# Patient Record
Sex: Female | Born: 1975 | Hispanic: Yes | Smoking: Never smoker
Health system: Southern US, Community
[De-identification: ages and names within clinical notes are randomized; demographics above are authoritative.]

## PROBLEM LIST (undated history)

## (undated) DIAGNOSIS — K760 Fatty (change of) liver, not elsewhere classified: Secondary | ICD-10-CM

## (undated) DIAGNOSIS — Z789 Other specified health status: Secondary | ICD-10-CM

## (undated) DIAGNOSIS — E559 Vitamin D deficiency, unspecified: Secondary | ICD-10-CM

## (undated) DIAGNOSIS — R0602 Shortness of breath: Secondary | ICD-10-CM

## (undated) DIAGNOSIS — M549 Dorsalgia, unspecified: Secondary | ICD-10-CM

## (undated) DIAGNOSIS — R519 Headache, unspecified: Secondary | ICD-10-CM

## (undated) DIAGNOSIS — Z853 Personal history of malignant neoplasm of breast: Secondary | ICD-10-CM

## (undated) DIAGNOSIS — Z9221 Personal history of antineoplastic chemotherapy: Secondary | ICD-10-CM

## (undated) DIAGNOSIS — J45909 Unspecified asthma, uncomplicated: Secondary | ICD-10-CM

## (undated) DIAGNOSIS — E781 Pure hyperglyceridemia: Secondary | ICD-10-CM

## (undated) DIAGNOSIS — G8929 Other chronic pain: Secondary | ICD-10-CM

## (undated) DIAGNOSIS — C50919 Malignant neoplasm of unspecified site of unspecified female breast: Secondary | ICD-10-CM

## (undated) DIAGNOSIS — Z8669 Personal history of other diseases of the nervous system and sense organs: Secondary | ICD-10-CM

## (undated) DIAGNOSIS — Z923 Personal history of irradiation: Secondary | ICD-10-CM

## (undated) DIAGNOSIS — T7840XA Allergy, unspecified, initial encounter: Secondary | ICD-10-CM

## (undated) DIAGNOSIS — N649 Disorder of breast, unspecified: Secondary | ICD-10-CM

## (undated) HISTORY — DX: Dorsalgia, unspecified: M54.9

## (undated) HISTORY — DX: Personal history of other diseases of the nervous system and sense organs: Z86.69

## (undated) HISTORY — DX: Shortness of breath: R06.02

## (undated) HISTORY — PX: BREAST LUMPECTOMY: SHX2

## (undated) HISTORY — DX: Vitamin D deficiency, unspecified: E55.9

## (undated) HISTORY — DX: Other chronic pain: G89.29

## (undated) HISTORY — PX: TUBAL LIGATION: SHX77

## (undated) HISTORY — DX: Morbid (severe) obesity due to excess calories: E66.01

## (undated) HISTORY — DX: Personal history of malignant neoplasm of breast: Z85.3

## (undated) HISTORY — DX: Fatty (change of) liver, not elsewhere classified: K76.0

## (undated) HISTORY — DX: Disorder of breast, unspecified: N64.9

## (undated) HISTORY — DX: Unspecified asthma, uncomplicated: J45.909

## (undated) HISTORY — DX: Allergy, unspecified, initial encounter: T78.40XA

## (undated) HISTORY — PX: MASTECTOMY: SHX3

---

## 2002-02-04 ENCOUNTER — Emergency Department (HOSPITAL_COMMUNITY): Admission: AC | Admit: 2002-02-04 | Discharge: 2002-02-04 | Payer: Self-pay

## 2002-02-04 ENCOUNTER — Encounter: Payer: Self-pay | Admitting: Emergency Medicine

## 2002-04-02 ENCOUNTER — Emergency Department (HOSPITAL_COMMUNITY): Admission: EM | Admit: 2002-04-02 | Discharge: 2002-04-02 | Payer: Self-pay | Admitting: Emergency Medicine

## 2002-04-02 ENCOUNTER — Encounter: Payer: Self-pay | Admitting: Emergency Medicine

## 2006-12-24 ENCOUNTER — Inpatient Hospital Stay (HOSPITAL_COMMUNITY): Admission: AD | Admit: 2006-12-24 | Discharge: 2006-12-24 | Payer: Self-pay | Admitting: Obstetrics & Gynecology

## 2007-03-23 ENCOUNTER — Ambulatory Visit (HOSPITAL_COMMUNITY): Admission: RE | Admit: 2007-03-23 | Discharge: 2007-03-23 | Payer: Self-pay | Admitting: Family Medicine

## 2007-07-29 ENCOUNTER — Ambulatory Visit: Payer: Self-pay | Admitting: *Deleted

## 2007-07-29 ENCOUNTER — Inpatient Hospital Stay (HOSPITAL_COMMUNITY): Admission: AD | Admit: 2007-07-29 | Discharge: 2007-07-29 | Payer: Self-pay | Admitting: Obstetrics & Gynecology

## 2007-07-30 ENCOUNTER — Ambulatory Visit: Payer: Self-pay | Admitting: Obstetrics and Gynecology

## 2007-07-30 ENCOUNTER — Inpatient Hospital Stay (HOSPITAL_COMMUNITY): Admission: AD | Admit: 2007-07-30 | Discharge: 2007-08-03 | Payer: Self-pay | Admitting: Obstetrics & Gynecology

## 2007-07-30 ENCOUNTER — Inpatient Hospital Stay (HOSPITAL_COMMUNITY): Admission: AD | Admit: 2007-07-30 | Discharge: 2007-07-30 | Payer: Self-pay | Admitting: Obstetrics and Gynecology

## 2007-07-31 ENCOUNTER — Encounter: Payer: Self-pay | Admitting: Obstetrics & Gynecology

## 2009-04-10 ENCOUNTER — Ambulatory Visit (HOSPITAL_COMMUNITY): Admission: RE | Admit: 2009-04-10 | Discharge: 2009-04-10 | Payer: Self-pay | Admitting: Obstetrics & Gynecology

## 2009-07-29 ENCOUNTER — Inpatient Hospital Stay (HOSPITAL_COMMUNITY): Admission: AD | Admit: 2009-07-29 | Discharge: 2009-07-29 | Payer: Self-pay | Admitting: Obstetrics & Gynecology

## 2009-07-29 ENCOUNTER — Ambulatory Visit: Payer: Self-pay | Admitting: Advanced Practice Midwife

## 2009-07-31 ENCOUNTER — Inpatient Hospital Stay (HOSPITAL_COMMUNITY): Admission: AD | Admit: 2009-07-31 | Discharge: 2009-07-31 | Payer: Self-pay | Admitting: Obstetrics and Gynecology

## 2009-07-31 ENCOUNTER — Ambulatory Visit: Payer: Self-pay | Admitting: Advanced Practice Midwife

## 2009-08-01 ENCOUNTER — Inpatient Hospital Stay (HOSPITAL_COMMUNITY): Admission: AD | Admit: 2009-08-01 | Discharge: 2009-08-04 | Payer: Self-pay | Admitting: Obstetrics and Gynecology

## 2009-08-01 ENCOUNTER — Ambulatory Visit: Payer: Self-pay | Admitting: Obstetrics and Gynecology

## 2010-12-15 LAB — CBC
Platelets: 155 10*3/uL (ref 150–400)
Platelets: 171 10*3/uL (ref 150–400)
RDW: 14.8 % (ref 11.5–15.5)
WBC: 9.6 10*3/uL (ref 4.0–10.5)

## 2010-12-15 LAB — TYPE AND SCREEN: Antibody Screen: NEGATIVE

## 2010-12-15 LAB — RPR: RPR Ser Ql: NONREACTIVE

## 2011-01-25 NOTE — Discharge Summary (Signed)
NAMEADALIZ, DOBIS               ACCOUNT NO.:  [id number]   MEDICAL RECORD NO.:  [id number]          PATIENT TYPE:  INP   LOCATION:  9115                          [facility]ILITY:  [facility]   PHYSICIAN:  Allie Bossier, MD        DATE OF BIRTH:  1976/06/09   DATE OF ADMISSION:  07/30/2007  DATE OF DISCHARGE:  08/03/2007                               DISCHARGE SUMMARY   This is an obstetric discharge summary.   ADMISSION DIAGNOSIS:  Patient was admitted at 40 weeks and 3 days  gestation, in active labor.   DISCHARGE DIAGNOSES:  Postoperative day #3, status post primary low  transverse cesarean section for failure to progress.   SIGNIFICANT FINDINGS:  Prenatal lab workup showed blood type of O  positive, antibody screen was negative, RPR was negative, rubella titer  was immune, hepatitis B surface antigen was negative, patient was GBS  positive and HIV testing was nonreactive.  Preoperative hemoglobin was  found to be 12.5, white blood cell count was 11.3 and platelet count was  188.  Postoperative CBC showed a white blood cell count of 9.7,  hemoglobin of 9.6, and a platelet count of 136.   HOSPITAL COURSE:  Patient was admitted on July 30, 2007, with onset  of labor.  This occurred on July 30, 2007.  Patient was allowed to  labor throughout the evening and day of November 18 and, by the morning  of November 18, it was noted that the patient had failed to progress  further, before the decision was made to take the patient to the  operating room for primary low transverse cesarean section, with the  indication of failure to progress in labor.  On July 31, 2007,  patient was taken to the operating room, for primary low transverse  cesarean section for failure to progress.  The patient tolerated the  procedure without difficulty.  Procedure rendered a viable female infant  with Apgars of 9 and 9 at one and five minutes respectively.  Infant  weight was 7 pounds 2 ounces.  Placenta and  cord blood were sent to  pathology.  Estimated blood loss from the procedure was less than 600  mL.  Patient was taken to the recovery room in good and stable  condition.   Postoperatively, the patient did well and had no complications.  After  surgery, the patient's vital signs remained stable and patient was  afebrile throughout the remainder of her hospital course.  On  postoperative day #3, patient had decreased and controlled pain,  bleeding was controlled and decreased, as well.  Patient's incision site  was clean, dry and intact, and patient was ambulating without  difficulty, tolerating food by mouth and had had a bowel movement.  On  postoperative day #3, the patient was deemed ready for discharge in good  and stable condition.  Patient voiced that she was ready to be  discharged to home, as well.   Order was written to take out her staple on postoperative day #4 through  #7 by Siri Cole nurse at home.  DISCHARGE MEDICATIONS:  1. Percocet 5/325 mg p.o. one or two pills every 6 hours as needed for      pain.  2. Ibuprofen 600 mg one pill every 6 hours as needed for pain.  3. Colace 100 mg one pill twice a day as needed for constipation.  4. Prenatal vitamins one per day for 6 weeks or while breast-feeding.   DISCHARGE INSTRUCTIONS:  1. Patient is to take medications as previously.  2. Patient is to maintain pelvic rest for the next six weeks with      nothing in the vagina over this time.  3. Patient is to not undertake any heavy lifting for the next six      weeks.  4. Patient is to return for any increased abdominal pain, increased      bleeding, pain or drainage from the incision site or other      concerning symptoms.  5. Patient is breast-feeding at the time of discharge and declines      birth control.  6. Patient is to follow up routinely postpartum and [facility] Department in six weeks.  7. Patient is to have the [facility] nurse come out to  the house to      take operative staples out on postoperative day #4 through #7.      Order is written for this to be carried out.   DISCHARGE CONDITION:  Stable, good.   DISPOSITION:  Patient is discharged to home in good and stable  condition.  Patient is breast-feeding at the time of discharge.      Myrtie Soman, MD      Allie Bossier, MD  Electronically Signed    TE/MEDQ  D:  08/03/2007  T:  08/03/2007  Job:  [id number]

## 2011-01-25 NOTE — Op Note (Signed)
NAMECANDIA, KINGSBURY               ACCOUNT NO.:  [id number]   MEDICAL RECORD NO.:  [id number]          PATIENT TYPE:  INP   LOCATION:  9115                          [facility]ILITY[facility]  [facility]   PHYSICIAN:  Allie Bossier, MD        DATE OF BIRTH:  Sep 20, 1975   DATE OF PROCEDURE:  07/30/2007  DATE OF DISCHARGE:                               OPERATIVE REPORT   PREOPERATIVE DIAGNOSIS:  Failure to progress.   POSTOPERATIVE DIAGNOSIS:  Failure to progress.   PROCEDURE:  Primary low transverse cesarean section.   SURGEON:  Clarisa Kindred, M.D.   ANESTHESIA:  Malen Gauze, M.D.,  epidural   COMPLICATIONS:  None.   ESTIMATED BLOOD LOSS:  Less than 600 mL.   SPECIMENS:  Cord blood.   FINDINGS:  1. Living female infant, Apgars 9 and 9 at one and five minutes,      weight 7 pounds 2 ounces.  2. Normal pelvic anatomy.  3. Intact placenta with three-vessel cord.  The placenta was then      densely stained with meconium and was sent to pathology.   DETAILS OF THE PROCEDURE AND FINDINGS:  The risks, benefits, and  alternatives of surgery were explained, understood and accepted.  Consents were signed.  The use of an interpreter was appreciated.  In  the operating room, her epidural was bolused for surgery.  She was  placed in a dorsal supine position with left lateral tilt.  Scalp  electrode was removed from the baby.  Her abdomen was prepped and draped  in usual sterile fashion.  After adequate anesthesia was assured, a  Pfannenstiel transverse incision was made approximately 2 cm above the  symphysis pubis.  Incision was carried down through the moderate amount  of subcutaneous tissue and fascia.  There were several large  subcutaneous vessels that required cautery to yield excellent  hemostasis.  The fascia was scored in the midline, and fascial incision  was extended bilaterally and curved slightly upwards.  The inner 1/3 of  the rectus muscles were separated in transverse fashion using  electrosurgical  technique.  The muscles were gently pulled to each side  then.  The peritoneum was entered with hemostats.  Peritoneal incision  was extended with the Bovie, taking care to avoid the bladder.  A  bladder blade was placed.  A transverse incision was made on the  moderately well-developed lower uterine segment.  Amniotomy was  performed with hemostats, and the uterine incision was extended with  bandage scissors, taking care to avoid the baby.  The baby's head was  delivered, and a loose nuchal cord was noted.  This was easily removed.  The mouth and nostrils were thoroughly suctioned (the baby had meconium  in the amniotic fluid).  The baby was delivered, its cord was clamped  and cut, and it was transferred to the pediatrician for routine care.  Its weight was 7 pounds 2 ounces, Apgars 9 and 9 and one and five  minutes.  Cord blood sample was obtained, the uterus was left in situ,  the placenta was extracted using  traction.  Great care was taken to  remove all of the tenuous pieces of meconium-stained membranes from the  interior of the uterus.  A bladder blade was placed, and the uterine  incision was closed with 2 layers of 0 chromic running locking suture,  the second layer imbricating the first.  Excellent hemostasis was noted.  The ovaries were palpably normal.  The incision was again inspected and  noted be hemostatic as were the rectus muscles and rectus fascia.  The  fascia was closed with 0 Vicryl running nonlocking suture.  No defects  were palpable.  The subcutaneous tissue was irrigated, cleaned and  dried.  It was then infiltrated with 30 mL of 0.5% Marcaine.  All  vessels and the subcutaneous tissues were assured to be hemostatic, and  a subcuticular closure was done with 3-0 Vicryl suture in a running  nonlocking fashion.  Steri-Strips were placed.  Instrument, sponge and  needles counts were correct.  Her Foley catheter drained clear urine  throughout.  She was taken to the  recovery in stable condition.  She  tolerated the procedure well.      Allie Bossier, MD  Electronically Signed     MCD/MEDQ  D:  07/31/2007  T:  07/31/2007  Job:  [id number]

## 2011-06-21 LAB — CBC
HCT: 28.4 — ABNORMAL LOW
Hemoglobin: 12.5
MCV: 89.5
RBC: 3.17 — ABNORMAL LOW
RBC: 4.06
RDW: 14.5
WBC: 9.7

## 2013-07-13 ENCOUNTER — Ambulatory Visit: Payer: Self-pay | Admitting: Family Medicine

## 2013-07-13 ENCOUNTER — Ambulatory Visit: Payer: Self-pay

## 2013-07-13 VITALS — BP 132/72 | HR 54 | Temp 98.0°F | Resp 16 | Ht 61.5 in | Wt 167.0 lb

## 2013-07-13 DIAGNOSIS — M25539 Pain in unspecified wrist: Secondary | ICD-10-CM

## 2013-07-13 DIAGNOSIS — R946 Abnormal results of thyroid function studies: Secondary | ICD-10-CM

## 2013-07-13 DIAGNOSIS — G8929 Other chronic pain: Secondary | ICD-10-CM

## 2013-07-13 DIAGNOSIS — M549 Dorsalgia, unspecified: Secondary | ICD-10-CM

## 2013-07-13 DIAGNOSIS — Z603 Acculturation difficulty: Secondary | ICD-10-CM | POA: Insufficient documentation

## 2013-07-13 DIAGNOSIS — M25531 Pain in right wrist: Secondary | ICD-10-CM

## 2013-07-13 DIAGNOSIS — R7989 Other specified abnormal findings of blood chemistry: Secondary | ICD-10-CM

## 2013-07-13 LAB — TSH: TSH: 2.493 u[IU]/mL (ref 0.350–4.500)

## 2013-07-13 MED ORDER — METHOCARBAMOL 500 MG PO TABS: 500.0000 mg | ORAL_TABLET | Freq: Four times a day (QID) | ORAL | Status: DC

## 2013-07-13 NOTE — Progress Notes (Signed)
[facility] [address], [place] [address] [phone]  Date:  07/13/2013   Name:  [name]   DOB:  12/06/1975   MRN:  [id number]  PCP:  No primary provider on file.    Chief Complaint: Back Pain, Numbness and Hypothyroidism   History of Present Illness:  [name] is a 37 y.o. very pleasant female patient who presents with the following:  She is here with complaint of pain in her lower back for 2 years, and of numbness in her bilateral hands and arms at night.   She has had this pain constantly for 2 years, it is never gone.  She has tried some OTC medications without success. She also brings with her extensive labs done in July at a "Bariatric clinic" that state her TSH was slightly high.  Her TSH was at 4.6 at that time.  otherwise her labs are generally ok.    She also notes that when she cleans her home the dust causes her SOB.    She has lived in the [place] for 15 years.  Does not speak [place].   She has 2 children, 31 and 29 years old.  Admits that she would like to get back to work, she has not worked since her children were born and her husband wants her to stay home and care for her youngest.  She is stressed out by being home all the time.   She did have a c/s and required a blood patch after her most recent spinal anesthesia for chronic HA/pain.  She notes that she is having a lot of headaches again recently    LMP 07/01/13  There are no active problems to display for this patient.   History reviewed. No pertinent past medical history.  History reviewed. No pertinent past surgical history.  History  Substance Use Topics  . Smoking status: Never Smoker   . Smokeless tobacco: Not on file  . Alcohol Use: No    History reviewed. No pertinent family history.  No Known Allergies  Medication list has been reviewed and updated.  No current outpatient prescriptions on file prior to visit.   No current facility-administered  medications on file prior to visit.    Review of Systems:  As per HPI- otherwise negative.   Physical Examination: Filed Vitals:   07/13/13 1108  BP: 132/72  Pulse: 54  Temp: 98 F (36.7 C)  Resp: 16   Filed Vitals:   07/13/13 1108  Height: 5' 1.5" (1.562 m)  Weight: 167 lb (75.751 kg)   Body mass index is 31.05 kg/(m^2). Ideal Body Weight: Weight in (lb) to have BMI = 25: 134.2  GEN: WDWN, NAD, Non-toxic, A & O x 3, overweight, looks well HEENT: Atraumatic, Normocephalic. Neck supple. No masses, No LAD.  Bilateral TM wnl, oropharynx normal.  PEERL,EOMI.   Ears and Nose: No external deformity. CV: RRR, No M/G/R. No JVD. No thrill. No extra heart sounds. PULM: CTA B, no wheezes, crackles, rhonchi. No retractions. No resp. distress. No accessory muscle use. EXTR: No c/c/e NEURO Normal gait.  Moves all 4 extremities normally  PSYCH: Normally interactive. Conversant. Not depressed or anxious appearing.  Calm demeanor.  She indicates pain over the muscles of her entire thoracic back, bilaterally.   Normal grip and ROM in both hands and arms.  Normal sensation on exam.    [facility] reading (PRIMARY) by  Dr. Patsy Lager. T spine: negative THORACIC SPINE -  2 VIEW  COMPARISON: None.  FINDINGS: Vertebral body height is well maintained. Mild osteophytic changes are seen. The pedicles are within normal limits and no paraspinal mass lesion is noted.  IMPRESSION: No acute abnormality noted.  Assessment and Plan: TSH elevation - Plan: TSH  Chronic back pain - Plan: DG Thoracic Spine 2 View  Bilateral wrist pain - Plan: methocarbamol (ROBAXIN) 500 MG tablet  Will recheck TSH as her elevation was very slight prior to deciding about thyroid replacement therapy.   Advised her to purchase OTC wrist splints to wear for likely CTS.  She should use them at night until she feels better.  Chronic back pain: explained that her symptoms are likely due to stress and muscle pain.  I am glad to  order further imaging such as CT or MRI, but at this time she declines.  Suspect her sx are due in part to stress and lack of life opportunities.  She can try robaxin as needed.l  Let me know if not better soon.    Signed Abbe Amsterdam, MD

## 2013-07-13 NOTE — Patient Instructions (Signed)
Try the robaxin as needed for your back pain.   Please buy wrist splints at [facility] or another store and wear them at night.   I will be in touch with your repeat thyroid level.

## 2013-07-14 ENCOUNTER — Encounter: Payer: Self-pay | Admitting: Family Medicine

## 2014-01-11 ENCOUNTER — Ambulatory Visit: Payer: Self-pay | Admitting: Emergency Medicine

## 2014-01-11 VITALS — BP 118/72 | HR 91 | Temp 98.4°F | Resp 16 | Ht 61.5 in | Wt 172.4 lb

## 2014-01-11 DIAGNOSIS — R0789 Other chest pain: Secondary | ICD-10-CM

## 2014-01-11 DIAGNOSIS — K2970 Gastritis, unspecified, without bleeding: Secondary | ICD-10-CM

## 2014-01-11 DIAGNOSIS — K2990 Gastroduodenitis, unspecified, without bleeding: Secondary | ICD-10-CM

## 2014-01-11 MED ORDER — GI COCKTAIL ~~LOC~~
30.0000 mL | Freq: Once | ORAL | Status: AC
  Administered 2014-01-11: 30 mL via ORAL

## 2014-01-11 MED ORDER — OMEPRAZOLE 40 MG PO CPDR: 40.0000 mg | DELAYED_RELEASE_CAPSULE | Freq: Every day | ORAL | Status: DC

## 2014-01-11 MED ORDER — ALBUTEROL SULFATE HFA 108 (90 BASE) MCG/ACT IN AERS: 2.0000 | INHALATION_SPRAY | RESPIRATORY_TRACT | Status: DC | PRN

## 2014-01-11 NOTE — Progress Notes (Signed)
[facility] [address] [place] [address] [phone]  Date:  01/11/2014   Name:  [name]   DOB:  05-10-1976   MRN:  [id number]  PCP:  No primary provider on file.    Chief Complaint: Chest Pain, Arm Pain and Shortness of Breath   History of Present Illness:  [name] is a 38 y.o. very pleasant female patient who presents with the following:  Says she has a problem with breathing after sweeping the house.  She has used an MDI previously with similar symptoms.  Cleaned the house on Thursday and afterward developed chest tightness in the center of her chest associated with heartburn.  She has no cough or wheezing. She has no rhinorrhea.  No fever or chills.  Pain is not pleuritic.  Not radiating.  Worse after eating.  Not exercise related. Denies smoking, OCP, travel, immobilization or anesthesia.   She has no calf pain or swelling. She is obese, has no hypertension, hyperlipidemia, diabetes, or family history of premature cardiac disease.  She is premenopausal.    Patient Active Problem List   Diagnosis Date Noted  . Impaired ability to use community resources due to language barrier 07/13/2013    History reviewed. No pertinent past medical history.  History reviewed. No pertinent past surgical history.  History  Substance Use Topics  . Smoking status: Never Smoker   . Smokeless tobacco: Not on file  . Alcohol Use: No    No family history on file.  No Known Allergies  Medication list has been reviewed and updated.  Current Outpatient Prescriptions on File Prior to Visit  Medication Sig Dispense Refill  . methocarbamol (ROBAXIN) 500 MG tablet Take 1 tablet (500 mg total) by mouth 4 (four) times daily.  30 tablet  0   No current facility-administered medications on file prior to visit.    Review of Systems:  As per HPI, otherwise negative.    Physical Examination: Filed Vitals:   01/11/14 1643  BP: 118/72  Pulse: 91   Temp: 98.4 F (36.9 C)  Resp: 16   Filed Vitals:   01/11/14 1643  Height: 5' 1.5" (1.562 m)  Weight: 172 lb 6.4 oz (78.2 kg)   Body mass index is 32.05 kg/(m^2). Ideal Body Weight: Weight in (lb) to have BMI = 25: 134.2  GEN: obese, NAD, Non-toxic, A & O x 3 HEENT: Atraumatic, Normocephalic. Neck supple. No masses, No LAD. Ears and Nose: No external deformity. CV: RRR, No M/G/R. No JVD. No thrill. No extra heart sounds. PULM: CTA B, no wheezes, crackles, rhonchi. No retractions. No resp. distress. No accessory muscle use. ABD: S, NT, ND, +BS. No rebound. No HSM. EXTR: No c/c/e NEURO Normal gait.  PSYCH: Normally interactive. Conversant. Not depressed or anxious appearing.  Calm demeanor.    Assessment and Plan: Given GI cocktail which improved symptoms.  Allergy to dust. MDI  Signed,  Ellison Carwin, MD

## 2014-01-11 NOTE — Patient Instructions (Signed)
Inhalador con medidor de dosis (sin espaciador)  (Metered Dose Inhaler (No Spacer Used))  Los medicamentos que se inhalan son la base del tratamiento del asma y otros problemas respiratorios. Los medicamentos inhalatorios son eficaces cuando se utilizan adecuadamente. Una buena tcnica asegura que el medicamento llegue a los pulmones.  Inhalador con medidor de dosis se utiliza para administrar varios tipos de medicamentos inhalados. Entre ellos se incluyen los medicamentos de alivio rpido o medicamentos de rescate (como los broncodilatadores) y los medicamentos de control (como los corticoides). El medicamento se administra presionando el contenedor metlico para liberar una cantidad de aerosol.  Si usa diferentes tipos de inhaladores, use el medicamento de alivio rpido para abrir las vas areas 10 15 minutos antes de usar un corticoide, si el mdico se lo indic. Si no est seguro de qu inhaladores usar y le indicaron usarlo, consulte a su mdico, enfermera o terapeuta en vas respiratorias.  CMO USAR EL INHALADOR   1. Retire la tapa del inhalador.  2. Si usa el inhalador por primera vez, ser necesario que lo prepare. Sacuda el inhalador durante 5 segundos y libere cuatro puffs en el aire, lejos del rostro. Consulte a su mdico o farmacutico si tiene preguntas acerca de la preparacin del inhalador.  3. Sacuda el inhalador durante 5 segundos antes de cada inspiracin (inhalacin).  4. Coloque el inhalador de modo que la parte superior del envase quede hacia arriba.  5. Coloque el dedo ndice en la parte superior del envase del medicamento. El pulgar sostiene la parte inferior del inhalador.  6. Abra la boca.  7. Coloque el inhalador entre los dientes y apriete los labios fuertemente alrededor de la boquilla, o sostenga el inhalador a 1-2 pulgadas (2,5 - 5 cm)de la boca abierta. Si no est seguro de qu tcnica usar, consulte a su mdico.  8. Espire (exhale) normalmente y lo ms profundamente  posible.  9. Presione el envase hacia abajo con el dedo ndice para liberar el medicamento.  10. Al mismo tiempo que presiona el envase, inhale profunda y lentamente hasta que los pulmones se llenen completamente. Esto debe llevarle entre 4 6 segundos. Mantenga la boca hacia abajo.  11. Mantenga la medicacin en los pulmones hasta 5 - 10 segundos (10 segundos es lo ms indicado). Esto ayuda a que el medicamento ingrese en las vas respiratorias pequeas y en los pulmones.  12. Exhale lentamente a travs de los labios fruncidos. Ponga los labios como cuando silba.  13. Espere al menos 15 30 segundos entre los puffs. Contine con los pasos indicados anteriormente hasta que haya tomado el nmero de puffs que el mdico le indic. No use el inhalador ms de lo que su mdico le indique.  14. Vuelva a colocar la tapa en el inhalador.  15. Siga las indicaciones de su mdico o las instrucciones que vienen para limpiar el inhalador.  Si utiliza un inhalador con corticoides, enjuague su boca con agua despus del ltimo puff, hgase grgaras y escupa el agua. No trague el agua.  Evite:   Inhalar antes o despus de que comience a salir el aerosol del medicamento. Es necesario tener prctica para coordinar la respiracin con la emisin del aerosol.   Al emitir el aerosol, inhale por la nariz (ms que por la boca).  CMO SABER SI EL INHALADOR EST LLENO O VACO  No puede saber cundo un inhalador est vaco al sacudirlo. Algunos inhaladores ahora vienen con contador de dosis. Pdale a su mdico que le recete   el que tiene el contador de dosis si siente que necesita ayuda extra. Si el inhalador no tiene contador, pdale a su mdico que lo ayude a determinar la fecha en que necesitar volver a llenarlo. Anote la fecha en que deber rellenarlo en un almanaque o en el envase del inhalador. Rellene el inhalador entre 7 10 das antes de que se termine. Asegrese de tener un adecuado suministro del medicamento. Esto incluye asegurarse  de que no est vencido y de que tiene un inhalador de repuesto.   SOLICITE ATENCIN MDICA SI:    Los sntomas slo se alivian parcialmente con el inhalador.   Tiene dificultad para usar el inhalador.   Aumenta la produccin de flema.  SOLICITE ATENCIN MDICA DE INMEDIATO SI:    Siente poco alivio con el uso de los inhaladores, o ni tiene ningn alivio. Contina con sibilancias y siente que le falta el aire u opresin en el pecho, o ambos.   Siente mareos, dolor de cabeza o latidos cardacos rpidos.   Siente escalofros, fiebre o sudores nocturnos.   Nota aumento en la produccin de flema o hay sangre en la flema.  Document Released: 12/15/2008 Document Revised: 05/01/2013  [employer] Patient Information 2014 [employer].

## 2015-01-12 ENCOUNTER — Other Ambulatory Visit (HOSPITAL_COMMUNITY): Payer: Self-pay | Admitting: Physician Assistant

## 2015-01-12 DIAGNOSIS — Z3689 Encounter for other specified antenatal screening: Secondary | ICD-10-CM

## 2015-01-12 LAB — OB RESULTS CONSOLE HEPATITIS B SURFACE ANTIGEN: HEP B S AG: NEGATIVE

## 2015-01-12 LAB — OB RESULTS CONSOLE RUBELLA ANTIBODY, IGM: Rubella: IMMUNE

## 2015-01-12 LAB — OB RESULTS CONSOLE ANTIBODY SCREEN: Antibody Screen: NEGATIVE

## 2015-01-12 LAB — OB RESULTS CONSOLE ABO/RH: RH Type: POSITIVE

## 2015-01-12 LAB — OB RESULTS CONSOLE GC/CHLAMYDIA
Chlamydia: NEGATIVE
Gonorrhea: NEGATIVE

## 2015-01-12 LAB — OB RESULTS CONSOLE RPR: RPR: NONREACTIVE

## 2015-01-12 LAB — OB RESULTS CONSOLE HIV ANTIBODY (ROUTINE TESTING): HIV: NONREACTIVE

## 2015-01-20 ENCOUNTER — Ambulatory Visit (HOSPITAL_COMMUNITY)
Admission: RE | Admit: 2015-01-20 | Discharge: 2015-01-20 | Disposition: A | Discharge status: Home / Self Care | Payer: Self-pay | Source: Ambulatory Visit | Attending: Physician Assistant | Admitting: Physician Assistant

## 2015-01-20 ENCOUNTER — Other Ambulatory Visit (HOSPITAL_COMMUNITY): Payer: Self-pay | Admitting: Physician Assistant

## 2015-01-20 DIAGNOSIS — Z3A12 12 weeks gestation of pregnancy: Secondary | ICD-10-CM | POA: Insufficient documentation

## 2015-01-20 DIAGNOSIS — O208 Other hemorrhage in early pregnancy: Secondary | ICD-10-CM | POA: Insufficient documentation

## 2015-01-20 DIAGNOSIS — Z3689 Encounter for other specified antenatal screening: Secondary | ICD-10-CM

## 2015-01-20 DIAGNOSIS — O26841 Uterine size-date discrepancy, first trimester: Secondary | ICD-10-CM | POA: Insufficient documentation

## 2015-01-21 ENCOUNTER — Other Ambulatory Visit (HOSPITAL_COMMUNITY): Payer: Self-pay | Admitting: Physician Assistant

## 2015-01-21 DIAGNOSIS — Z3682 Encounter for antenatal screening for nuchal translucency: Secondary | ICD-10-CM

## 2015-01-22 ENCOUNTER — Ambulatory Visit (HOSPITAL_COMMUNITY)
Admission: RE | Admit: 2015-01-22 | Discharge: 2015-01-22 | Disposition: A | Discharge status: Home / Self Care | Payer: Medicaid Other | Source: Ambulatory Visit | Attending: Physician Assistant | Admitting: Physician Assistant

## 2015-01-22 ENCOUNTER — Other Ambulatory Visit (HOSPITAL_COMMUNITY): Payer: Self-pay | Admitting: Maternal and Fetal Medicine

## 2015-01-22 ENCOUNTER — Encounter (HOSPITAL_COMMUNITY): Payer: Self-pay

## 2015-01-22 ENCOUNTER — Other Ambulatory Visit (HOSPITAL_COMMUNITY): Payer: Self-pay | Admitting: Physician Assistant

## 2015-01-22 ENCOUNTER — Ambulatory Visit (HOSPITAL_COMMUNITY): Admission: RE | Admit: 2015-01-22 | Payer: Medicaid Other | Source: Ambulatory Visit

## 2015-01-22 VITALS — BP 133/72 | HR 84 | Wt 180.0 lb

## 2015-01-22 DIAGNOSIS — Z315 Encounter for genetic counseling: Secondary | ICD-10-CM | POA: Diagnosis not present

## 2015-01-22 DIAGNOSIS — Z3A13 13 weeks gestation of pregnancy: Secondary | ICD-10-CM | POA: Diagnosis not present

## 2015-01-22 DIAGNOSIS — IMO0002 Reserved for concepts with insufficient information to code with codable children: Secondary | ICD-10-CM

## 2015-01-22 DIAGNOSIS — O09521 Supervision of elderly multigravida, first trimester: Secondary | ICD-10-CM

## 2015-01-22 DIAGNOSIS — O09522 Supervision of elderly multigravida, second trimester: Secondary | ICD-10-CM

## 2015-01-22 DIAGNOSIS — Z3682 Encounter for antenatal screening for nuchal translucency: Secondary | ICD-10-CM | POA: Insufficient documentation

## 2015-01-22 DIAGNOSIS — Z0489 Encounter for examination and observation for other specified reasons: Secondary | ICD-10-CM

## 2015-01-22 DIAGNOSIS — O09529 Supervision of elderly multigravida, unspecified trimester: Secondary | ICD-10-CM | POA: Insufficient documentation

## 2015-01-22 NOTE — Progress Notes (Signed)
Genetic Counseling  High-Risk Gestation Note  Appointment Date:  01/22/2015 Referred By: Willene Hatchet, PA-C Date of Birth:  Dec 02, 1975   Pregnancy History: [id number] Estimated Date of Delivery: 07/30/15 Estimated Gestational Age: [redacted]w[redacted]d Attending: Wyvonne Lenz   [name] was seen for genetic counseling because of a maternal age of 39 y.o..   [place], provided interpretation for today's visit.   She was/They were counseled regarding maternal age and the association with risk for chromosome conditions due to nondisjunction with aging of the ova.   We reviewed chromosomes, nondisjunction, and the associated 1 in 13 risk for fetal aneuploidy related to a maternal age of 39 y.o. at [redacted]w[redacted]d gestation.  She was counseled that the risk for aneuploidy decreases as gestational age increases, accounting for those pregnancies which spontaneously abort.  We specifically discussed Down syndrome (trisomy 86), trisomies 54 and 18, and sex chromosome aneuploidies (47,XXX and 47,XXY) including the common features and prognoses of each.   We reviewed available screening options including First Screen, Quad screen, noninvasive prenatal screening (NIPS)/cell free DNA (cfDNA) testing, and detailed ultrasound.  She was counseled that screening tests are used to modify a patient's a priori risk for aneuploidy, typically based on age. This estimate provides a pregnancy specific risk assessment. We reviewed the benefits and limitations of each option. Specifically, we discussed the conditions for which each test screens, the detection rates, and false positive rates of each. She was also counseled regarding diagnostic testing via amniocentesis. We reviewed the approximate 1 in 102-585 risk for complications for amniocentesis, including spontaneous pregnancy loss.   After consideration of all the options, she elected to proceed with ultrasound only in the pregnancy. Ms.  [name] declined amniocentesis in the pregnancy, given the associated risk for complications. She initially expressed interest in NIPS; however, after further consideration, she stated that she changed her mind and would not want to know in a pregnancy if a chromosome condition was present. At this time, she declined NIPS, First Screen, and Quad screen.   Nuchal translucency ultrasound was performed today.  The report will be documented separately.  Detailed ultrasound was scheduled for 03/04/15.  She understands that screening tests cannot rule out all birth defects or genetic syndromes. The patient was advised of this limitation and states she still does not want additional testing at this time.   Family history information was not obtained at this time due to time constraints and given that patient was not originally scheduled for genetic counseling today.   [name] denied exposure to environmental toxins or chemical agents. She denied the use of alcohol, tobacco or street drugs. She denied significant viral illnesses during the course of her pregnancy. Her medical and surgical histories were noncontributory.   I counseled [name] regarding the above risks and available options.  The approximate face-to-face time with the genetic counselor was 45 minutes.  Chipper Oman, MS,  Certified Genetic Counselor 01/22/2015

## 2015-03-04 ENCOUNTER — Ambulatory Visit (HOSPITAL_COMMUNITY)
Admission: RE | Admit: 2015-03-04 | Discharge: 2015-03-04 | Disposition: A | Discharge status: Home / Self Care | Payer: Medicaid Other | Source: Ambulatory Visit | Attending: Physician Assistant | Admitting: Physician Assistant

## 2015-03-04 DIAGNOSIS — Z36 Encounter for antenatal screening of mother: Secondary | ICD-10-CM | POA: Insufficient documentation

## 2015-03-04 DIAGNOSIS — Z3689 Encounter for other specified antenatal screening: Secondary | ICD-10-CM | POA: Insufficient documentation

## 2015-03-04 DIAGNOSIS — Z3A18 18 weeks gestation of pregnancy: Secondary | ICD-10-CM | POA: Insufficient documentation

## 2015-03-04 DIAGNOSIS — O3421 Maternal care for scar from previous cesarean delivery: Secondary | ICD-10-CM | POA: Insufficient documentation

## 2015-03-04 DIAGNOSIS — O09522 Supervision of elderly multigravida, second trimester: Secondary | ICD-10-CM

## 2015-07-08 ENCOUNTER — Encounter (HOSPITAL_COMMUNITY): Payer: Self-pay | Admitting: Family Medicine

## 2015-07-08 DIAGNOSIS — Z302 Encounter for sterilization: Secondary | ICD-10-CM

## 2015-07-08 DIAGNOSIS — O34219 Maternal care for unspecified type scar from previous cesarean delivery: Secondary | ICD-10-CM | POA: Diagnosis present

## 2015-07-08 NOTE — H&P (Signed)
Monica BLODGETT is an 39 y.o. [phone] [redacted]w[redacted]d female.   Chief Complaint: Previous C-section x 2 and undesired fertility HPI: At 3 wks for Repeat C-section  No past medical history on file.  No past surgical history on file.  No family history on file. Social History:  reports that she has never smoked. She does not have any smokeless tobacco history on file. She reports that she does not drink alcohol. Her drug history is not on file.   No Known Allergies  No current facility-administered medications on file prior to encounter.   Current Outpatient Prescriptions on File Prior to Encounter  Medication Sig Dispense Refill  . albuterol (PROVENTIL HFA;VENTOLIN HFA) 108 (90 BASE) MCG/ACT inhaler Inhale 2 puffs into the lungs every 4 (four) hours as needed for wheezing or shortness of breath (cough, shortness of breath or wheezing.). 1 Inhaler 1  . methocarbamol (ROBAXIN) 500 MG tablet Take 1 tablet (500 mg total) by mouth 4 (four) times daily. (Patient not taking: Reported on 01/22/2015) 30 tablet 0  . omeprazole (PRILOSEC) 40 MG capsule Take 1 capsule (40 mg total) by mouth daily. (Patient not taking: Reported on 01/22/2015) 30 capsule 3  . Prenatal Vit-Fe Fumarate-FA (PRENATAL VITAMIN PO) Take by mouth.      A comprehensive review of systems was negative.  Last menstrual period 10/23/2014. General appearance: alert, cooperative and appears stated age Head: Normocephalic, without obvious abnormality, atraumatic Neck: supple, symmetrical, trachea midline Lungs: normal effort Heart: regular rate and rhythm Abdomen: gravid, NT Extremities: extremities normal, atraumatic, no cyanosis or edema Skin: Skin color, texture, turgor normal. No rashes or lesions Neurologic: Grossly normal   Lab Results  Component Value Date   WBC 8.2 08/02/2009   HGB 9.7* 08/02/2009   HCT 29.3* 08/02/2009   MCV 88.3 08/02/2009   PLT 155 08/02/2009    Assessment/Plan Patient Active Problem List   Diagnosis Date Noted  . Previous cesarean delivery, antepartum condition or complication [phone]  . Encounter for sterilization 07/08/2015  . Advanced maternal age in multigravida   . Impaired ability to use community resources due to language barrier 07/13/2013  For RLTCS and BTL Risks include but are not limited to bleeding, infection, injury to surrounding structures, including bowel, bladder and ureters, blood clots, and death.  Likelihood of success is high. Patient counseled, r.e. Risks benefits of BTL, including permanency of procedure, risk of failure(1:100), increased risk of ectopic.  Patient verbalized understanding and desires to proceed     PRATT,TANYA S 07/08/2015, 11:20 AM

## 2015-07-22 ENCOUNTER — Inpatient Hospital Stay (HOSPITAL_COMMUNITY)
Admission: RE | Admit: 2015-07-22 | Discharge: 2015-07-22 | Disposition: A | Discharge status: Home / Self Care | Payer: Medicaid Other | Source: Ambulatory Visit

## 2015-07-22 DIAGNOSIS — O34219 Maternal care for unspecified type scar from previous cesarean delivery: Secondary | ICD-10-CM

## 2015-07-22 NOTE — Patient Instructions (Signed)
   Your procedure is scheduled on: NOV 10 (THURSDAY)  Enter through the Main Entrance of [facility] up the phone at the desk and dial [phone] and inform us of your arrival.  Please call this number if you have any problems the morning of surgery: [phone]  DO NOT EAT OR DRINK AFTER MIDNIGHT NOV 9 (WEDNESDAY)  Take these medicines the morning of surgery with a SIP OF WATER:  Do not wear jewelry No metal in your hair or on your body. Do not wear lotions, powders, perfumes.  You may wear deodorant.  Do not bring valuables to the hospital. Contacts, dentures or bridgework may not be worn into surgery.  Leave suitcase in the car. After Surgery it may be brought to your room. For patients being admitted to the hospital, checkout time is 11:00am the day of discharge.

## 2015-07-22 NOTE — Anesthesia Preprocedure Evaluation (Addendum)
Anesthesia Evaluation  Patient identified by MRN, date of birth, ID band Patient awake    Reviewed: Allergy & Precautions, NPO status , Patient's Chart, lab work & pertinent test results  History of Anesthesia Complications Negative for: history of anesthetic complications  Airway Mallampati: III  TM Distance: >3 FB Neck ROM: Full    Dental no notable dental hx. (+) Dental Advisory Given   Pulmonary neg pulmonary ROS,    Pulmonary exam normal breath sounds clear to auscultation       Cardiovascular negative cardio ROS Normal cardiovascular exam Rhythm:Regular Rate:Normal     Neuro/Psych negative neurological ROS  negative psych ROS   GI/Hepatic negative GI ROS, Neg liver ROS,   Endo/Other  obesity  Renal/GU negative Renal ROS  negative genitourinary   Musculoskeletal negative musculoskeletal ROS (+)   Abdominal   Peds negative pediatric ROS (+)  Hematology negative hematology ROS (+)   Anesthesia Other Findings   Reproductive/Obstetrics (+) Pregnancy                            Anesthesia Physical Anesthesia Plan  ASA: II  Anesthesia Plan: Spinal   Post-op Pain Management:    Induction:   Airway Management Planned:   Additional Equipment:   Intra-op Plan:   Post-operative Plan:   Informed Consent: I have reviewed the patients History and Physical, chart, labs and discussed the procedure including the risks, benefits and alternatives for the proposed anesthesia with the patient or authorized representative who has indicated his/her understanding and acceptance.   Dental advisory given  Plan Discussed with: CRNA  Anesthesia Plan Comments:         Anesthesia Quick Evaluation

## 2015-07-22 NOTE — Progress Notes (Signed)
PT DID NOT SHOW UP FOR PRE-OP VISIT.Marland Kitchen EDA ROYAL CALLED PT AND GAVE LSD INSTRUCTIONS OVER THE PHONE.Marland Kitchen PT TO ARRIVE AT 7AM MAIN ENTRANCE.. NPO AFTER MN TONIGHT

## 2015-07-23 ENCOUNTER — Encounter (HOSPITAL_COMMUNITY): Payer: Self-pay | Admitting: Anesthesiology

## 2015-07-23 ENCOUNTER — Inpatient Hospital Stay (HOSPITAL_COMMUNITY): Payer: Medicaid Other | Admitting: Anesthesiology

## 2015-07-23 ENCOUNTER — Encounter (HOSPITAL_COMMUNITY)
Admission: RE | Disposition: A | Discharge status: Home / Self Care | Payer: Self-pay | Source: Ambulatory Visit | Attending: Family Medicine

## 2015-07-23 ENCOUNTER — Inpatient Hospital Stay (HOSPITAL_COMMUNITY)
Admission: RE | Admit: 2015-07-23 | Discharge: 2015-07-25 | DRG: 766 | Disposition: A | Discharge status: Home / Self Care | Payer: Medicaid Other | Source: Ambulatory Visit | Attending: Family Medicine | Admitting: Family Medicine

## 2015-07-23 DIAGNOSIS — Z3A39 39 weeks gestation of pregnancy: Secondary | ICD-10-CM

## 2015-07-23 DIAGNOSIS — O34219 Maternal care for unspecified type scar from previous cesarean delivery: Secondary | ICD-10-CM | POA: Diagnosis present

## 2015-07-23 DIAGNOSIS — O34211 Maternal care for low transverse scar from previous cesarean delivery: Principal | ICD-10-CM | POA: Diagnosis present

## 2015-07-23 DIAGNOSIS — O99824 Streptococcus B carrier state complicating childbirth: Secondary | ICD-10-CM | POA: Diagnosis present

## 2015-07-23 DIAGNOSIS — O09523 Supervision of elderly multigravida, third trimester: Secondary | ICD-10-CM

## 2015-07-23 DIAGNOSIS — Z302 Encounter for sterilization: Secondary | ICD-10-CM | POA: Diagnosis not present

## 2015-07-23 DIAGNOSIS — Z3490 Encounter for supervision of normal pregnancy, unspecified, unspecified trimester: Secondary | ICD-10-CM

## 2015-07-23 LAB — CBC
HCT: 35.1 % — ABNORMAL LOW (ref 36.0–46.0)
HEMOGLOBIN: 11.7 g/dL — AB (ref 12.0–15.0)
MCH: 28.4 pg (ref 26.0–34.0)
MCHC: 33.3 g/dL (ref 30.0–36.0)
MCV: 85.2 fL (ref 78.0–100.0)
PLATELETS: 187 10*3/uL (ref 150–400)
RBC: 4.12 MIL/uL (ref 3.87–5.11)
RDW: 13.8 % (ref 11.5–15.5)
WBC: 6.4 10*3/uL (ref 4.0–10.5)

## 2015-07-23 LAB — TYPE AND SCREEN
ABO/RH(D): O POS
Antibody Screen: NEGATIVE

## 2015-07-23 LAB — RPR: RPR: NONREACTIVE

## 2015-07-23 SURGERY — Surgical Case
Anesthesia: Spinal | Site: Abdomen | Laterality: Bilateral

## 2015-07-23 MED ORDER — OXYTOCIN 10 UNIT/ML IJ SOLN
INTRAMUSCULAR | Status: AC
  Filled 2015-07-23: qty 4

## 2015-07-23 MED ORDER — BUPIVACAINE HCL (PF) 0.25 % IJ SOLN
INTRAMUSCULAR | Status: AC
  Filled 2015-07-23: qty 30

## 2015-07-23 MED ORDER — ZOLPIDEM TARTRATE 5 MG PO TABS: 5.0000 mg | ORAL_TABLET | Freq: Every evening | ORAL | Status: DC | PRN

## 2015-07-23 MED ORDER — SENNOSIDES-DOCUSATE SODIUM 8.6-50 MG PO TABS
2.0000 | ORAL_TABLET | ORAL | Status: DC
  Administered 2015-07-24 (×2): 2 via ORAL
  Filled 2015-07-23 (×2): qty 2

## 2015-07-23 MED ORDER — SCOPOLAMINE 1 MG/3DAYS TD PT72
MEDICATED_PATCH | TRANSDERMAL | Status: AC
  Administered 2015-07-23: 1.5 mg via TRANSDERMAL
  Filled 2015-07-23: qty 1

## 2015-07-23 MED ORDER — FENTANYL CITRATE (PF) 100 MCG/2ML IJ SOLN
INTRAMUSCULAR | Status: AC
  Filled 2015-07-23: qty 2

## 2015-07-23 MED ORDER — MORPHINE SULFATE (PF) 0.5 MG/ML IJ SOLN
INTRAMUSCULAR | Status: DC | PRN
  Administered 2015-07-23: .2 mg via EPIDURAL

## 2015-07-23 MED ORDER — NALBUPHINE HCL 10 MG/ML IJ SOLN: 5.0000 mg | Freq: Once | INTRAMUSCULAR | Status: DC | PRN

## 2015-07-23 MED ORDER — ALBUTEROL SULFATE (2.5 MG/3ML) 0.083% IN NEBU: 3.0000 mL | INHALATION_SOLUTION | RESPIRATORY_TRACT | Status: DC | PRN

## 2015-07-23 MED ORDER — MENTHOL 3 MG MT LOZG: 1.0000 | LOZENGE | OROMUCOSAL | Status: DC | PRN

## 2015-07-23 MED ORDER — SIMETHICONE 80 MG PO CHEW: 80.0000 mg | CHEWABLE_TABLET | ORAL | Status: DC | PRN

## 2015-07-23 MED ORDER — DIPHENHYDRAMINE HCL 50 MG/ML IJ SOLN: 12.5000 mg | INTRAMUSCULAR | Status: DC | PRN

## 2015-07-23 MED ORDER — KETOROLAC TROMETHAMINE 30 MG/ML IJ SOLN
30.0000 mg | Freq: Four times a day (QID) | INTRAMUSCULAR | Status: AC | PRN
  Administered 2015-07-23: 30 mg via INTRAMUSCULAR

## 2015-07-23 MED ORDER — BUPIVACAINE HCL (PF) 0.5 % IJ SOLN
INTRAMUSCULAR | Status: AC
  Filled 2015-07-23: qty 90

## 2015-07-23 MED ORDER — ACETAMINOPHEN 325 MG PO TABS
650.0000 mg | ORAL_TABLET | ORAL | Status: DC | PRN
  Administered 2015-07-23 (×2): 650 mg via ORAL
  Filled 2015-07-23 (×2): qty 2

## 2015-07-23 MED ORDER — PHENYLEPHRINE 8 MG IN D5W 100 ML (0.08MG/ML) PREMIX OPTIME
INJECTION | INTRAVENOUS | Status: DC | PRN
  Administered 2015-07-23: 60 ug/min via INTRAVENOUS

## 2015-07-23 MED ORDER — LACTATED RINGERS IV SOLN
INTRAVENOUS | Status: DC
  Administered 2015-07-23: 20:00:00 via INTRAVENOUS

## 2015-07-23 MED ORDER — CEFAZOLIN SODIUM-DEXTROSE 2-3 GM-% IV SOLR
INTRAVENOUS | Status: AC
  Filled 2015-07-23: qty 50

## 2015-07-23 MED ORDER — FENTANYL CITRATE (PF) 100 MCG/2ML IJ SOLN
25.0000 ug | INTRAMUSCULAR | Status: DC | PRN
  Administered 2015-07-23 (×2): 50 ug via INTRAVENOUS

## 2015-07-23 MED ORDER — NALOXONE HCL 2 MG/2ML IJ SOSY
1.0000 ug/kg/h | PREFILLED_SYRINGE | INTRAVENOUS | Status: DC | PRN
  Filled 2015-07-23: qty 2

## 2015-07-23 MED ORDER — BUPIVACAINE HCL (PF) 0.25 % IJ SOLN
INTRAMUSCULAR | Status: AC
  Filled 2015-07-23: qty 10

## 2015-07-23 MED ORDER — CEFAZOLIN SODIUM-DEXTROSE 2-3 GM-% IV SOLR
2.0000 g | INTRAVENOUS | Status: AC
  Administered 2015-07-23: 2 g via INTRAVENOUS

## 2015-07-23 MED ORDER — NALBUPHINE HCL 10 MG/ML IJ SOLN: 5.0000 mg | INTRAMUSCULAR | Status: DC | PRN

## 2015-07-23 MED ORDER — ONDANSETRON HCL 4 MG/2ML IJ SOLN: 4.0000 mg | Freq: Three times a day (TID) | INTRAMUSCULAR | Status: DC | PRN

## 2015-07-23 MED ORDER — LACTATED RINGERS IV SOLN
INTRAVENOUS | Status: DC
  Administered 2015-07-23 (×2): via INTRAVENOUS

## 2015-07-23 MED ORDER — MEPERIDINE HCL 25 MG/ML IJ SOLN: 6.2500 mg | INTRAMUSCULAR | Status: DC | PRN

## 2015-07-23 MED ORDER — DIBUCAINE 1 % RE OINT: 1.0000 "application " | TOPICAL_OINTMENT | RECTAL | Status: DC | PRN

## 2015-07-23 MED ORDER — SCOPOLAMINE 1 MG/3DAYS TD PT72
1.0000 | MEDICATED_PATCH | Freq: Once | TRANSDERMAL | Status: DC
  Administered 2015-07-23: 1.5 mg via TRANSDERMAL

## 2015-07-23 MED ORDER — DIPHENHYDRAMINE HCL 25 MG PO CAPS: 25.0000 mg | ORAL_CAPSULE | Freq: Four times a day (QID) | ORAL | Status: DC | PRN

## 2015-07-23 MED ORDER — ONDANSETRON HCL 4 MG/2ML IJ SOLN: 4.0000 mg | Freq: Once | INTRAMUSCULAR | Status: DC | PRN

## 2015-07-23 MED ORDER — ONDANSETRON HCL 4 MG/2ML IJ SOLN
INTRAMUSCULAR | Status: DC | PRN
  Administered 2015-07-23: 4 mg via INTRAVENOUS

## 2015-07-23 MED ORDER — BUPIVACAINE-EPINEPHRINE (PF) 0.5% -1:200000 IJ SOLN
INTRAMUSCULAR | Status: AC
  Filled 2015-07-23: qty 30

## 2015-07-23 MED ORDER — SIMETHICONE 80 MG PO CHEW
80.0000 mg | CHEWABLE_TABLET | ORAL | Status: DC
  Administered 2015-07-24: 80 mg via ORAL
  Filled 2015-07-23: qty 1

## 2015-07-23 MED ORDER — TETANUS-DIPHTH-ACELL PERTUSSIS 5-2.5-18.5 LF-MCG/0.5 IM SUSP: 0.5000 mL | Freq: Once | INTRAMUSCULAR | Status: DC

## 2015-07-23 MED ORDER — LANOLIN HYDROUS EX OINT: 1.0000 "application " | TOPICAL_OINTMENT | CUTANEOUS | Status: DC | PRN

## 2015-07-23 MED ORDER — PHENYLEPHRINE 8 MG IN D5W 100 ML (0.08MG/ML) PREMIX OPTIME
INJECTION | INTRAVENOUS | Status: AC
  Filled 2015-07-23: qty 100

## 2015-07-23 MED ORDER — SIMETHICONE 80 MG PO CHEW
80.0000 mg | CHEWABLE_TABLET | Freq: Three times a day (TID) | ORAL | Status: DC
  Administered 2015-07-23 – 2015-07-25 (×6): 80 mg via ORAL
  Filled 2015-07-23 (×6): qty 1

## 2015-07-23 MED ORDER — SODIUM CHLORIDE 0.9 % IJ SOLN: 3.0000 mL | INTRAMUSCULAR | Status: DC | PRN

## 2015-07-23 MED ORDER — LACTATED RINGERS IV SOLN
INTRAVENOUS | Status: DC | PRN
  Administered 2015-07-23: 10:00:00 via INTRAVENOUS

## 2015-07-23 MED ORDER — OXYCODONE-ACETAMINOPHEN 5-325 MG PO TABS: 2.0000 | ORAL_TABLET | ORAL | Status: DC | PRN

## 2015-07-23 MED ORDER — KETOROLAC TROMETHAMINE 30 MG/ML IJ SOLN
INTRAMUSCULAR | Status: AC
  Filled 2015-07-23: qty 1

## 2015-07-23 MED ORDER — NALOXONE HCL 0.4 MG/ML IJ SOLN: 0.4000 mg | INTRAMUSCULAR | Status: DC | PRN

## 2015-07-23 MED ORDER — BUPIVACAINE HCL (PF) 0.25 % IJ SOLN
INTRAMUSCULAR | Status: DC | PRN
  Administered 2015-07-23: 30 mL

## 2015-07-23 MED ORDER — ONDANSETRON HCL 4 MG/2ML IJ SOLN
INTRAMUSCULAR | Status: AC
  Filled 2015-07-23: qty 2

## 2015-07-23 MED ORDER — FENTANYL CITRATE (PF) 100 MCG/2ML IJ SOLN
INTRAMUSCULAR | Status: DC | PRN
  Administered 2015-07-23: 10 ug via INTRAVENOUS

## 2015-07-23 MED ORDER — DIPHENHYDRAMINE HCL 25 MG PO CAPS: 25.0000 mg | ORAL_CAPSULE | ORAL | Status: DC | PRN

## 2015-07-23 MED ORDER — PRENATAL MULTIVITAMIN CH
1.0000 | ORAL_TABLET | Freq: Every day | ORAL | Status: DC
  Administered 2015-07-24 – 2015-07-25 (×2): 1 via ORAL
  Filled 2015-07-23 (×2): qty 1

## 2015-07-23 MED ORDER — LACTATED RINGERS IV SOLN
Freq: Once | INTRAVENOUS | Status: AC
  Administered 2015-07-23: 08:00:00 via INTRAVENOUS

## 2015-07-23 MED ORDER — SCOPOLAMINE 1 MG/3DAYS TD PT72
1.0000 | MEDICATED_PATCH | Freq: Once | TRANSDERMAL | Status: DC
  Filled 2015-07-23: qty 1

## 2015-07-23 MED ORDER — BUPIVACAINE IN DEXTROSE 0.75-8.25 % IT SOLN
INTRATHECAL | Status: DC | PRN
  Administered 2015-07-23: 1.6 mL via INTRATHECAL

## 2015-07-23 MED ORDER — KETOROLAC TROMETHAMINE 30 MG/ML IJ SOLN: 30.0000 mg | Freq: Four times a day (QID) | INTRAMUSCULAR | Status: AC | PRN

## 2015-07-23 MED ORDER — IBUPROFEN 600 MG PO TABS
600.0000 mg | ORAL_TABLET | Freq: Four times a day (QID) | ORAL | Status: DC
  Administered 2015-07-23 – 2015-07-25 (×8): 600 mg via ORAL
  Filled 2015-07-23 (×8): qty 1

## 2015-07-23 MED ORDER — MORPHINE SULFATE (PF) 0.5 MG/ML IJ SOLN
INTRAMUSCULAR | Status: AC
  Filled 2015-07-23: qty 10

## 2015-07-23 MED ORDER — SODIUM CHLORIDE 0.9 % IR SOLN
Status: DC | PRN
  Administered 2015-07-23: 1000 mL

## 2015-07-23 MED ORDER — WITCH HAZEL-GLYCERIN EX PADS: 1.0000 "application " | MEDICATED_PAD | CUTANEOUS | Status: DC | PRN

## 2015-07-23 MED ORDER — OXYTOCIN 40 UNITS IN LACTATED RINGERS INFUSION - SIMPLE MED: 62.5000 mL/h | INTRAVENOUS | Status: AC

## 2015-07-23 MED ORDER — OXYCODONE-ACETAMINOPHEN 5-325 MG PO TABS
1.0000 | ORAL_TABLET | ORAL | Status: DC | PRN
  Administered 2015-07-24 – 2015-07-25 (×6): 1 via ORAL
  Filled 2015-07-23 (×6): qty 1

## 2015-07-23 SURGICAL SUPPLY — 45 items
APL SKNCLS STERI-STRIP NONHPOA (GAUZE/BANDAGES/DRESSINGS) ×1
APPLIER CLIP 5 13 M/L LIGAMAX5 (MISCELLANEOUS) ×3
APR CLP MED LRG 5 ANG JAW (MISCELLANEOUS) ×1
BENZOIN TINCTURE PRP APPL 2/3 (GAUZE/BANDAGES/DRESSINGS) ×2 IMPLANT
CLAMP CORD UMBIL (MISCELLANEOUS) IMPLANT
CLIP APPLIE 5 13 M/L LIGAMAX5 (MISCELLANEOUS) IMPLANT
CLIP FILSHIE TUBAL LIGA STRL (Clip) ×4 IMPLANT
CLOSURE STERI-STRIP 1/2X4 (GAUZE/BANDAGES/DRESSINGS) ×1
CLOSURE WOUND 1/2 X4 (GAUZE/BANDAGES/DRESSINGS) ×1
CLOTH BEACON ORANGE TIMEOUT ST (SAFETY) ×3 IMPLANT
CLSR STERI-STRIP ANTIMIC 1/2X4 (GAUZE/BANDAGES/DRESSINGS) ×2 IMPLANT
DRAPE SHEET LG 3/4 BI-LAMINATE (DRAPES) IMPLANT
DRSG OPSITE POSTOP 4X10 (GAUZE/BANDAGES/DRESSINGS) ×3 IMPLANT
DURAPREP 26ML APPLICATOR (WOUND CARE) ×3 IMPLANT
ELECT REM PT RETURN 9FT ADLT (ELECTROSURGICAL) ×3
ELECTRODE REM PT RTRN 9FT ADLT (ELECTROSURGICAL) ×1 IMPLANT
EXTRACTOR VACUUM M CUP 4 TUBE (SUCTIONS) IMPLANT
EXTRACTOR VACUUM M CUP 4' TUBE (SUCTIONS)
GLOVE BIOGEL PI IND STRL 7.0 (GLOVE) ×2 IMPLANT
GLOVE BIOGEL PI INDICATOR 7.0 (GLOVE) ×4
GLOVE ECLIPSE 7.0 STRL STRAW (GLOVE) ×6 IMPLANT
GOWN STRL REUS W/TWL LRG LVL3 (GOWN DISPOSABLE) ×6 IMPLANT
HEMOSTAT SURGICEL 4X8 (HEMOSTASIS) ×2 IMPLANT
KIT ABG SYR 3ML LUER SLIP (SYRINGE) IMPLANT
NDL HYPO 25X5/8 SAFETYGLIDE (NEEDLE) IMPLANT
NDL SAFETY ECLIPSE 18X1.5 (NEEDLE) IMPLANT
NEEDLE HYPO 18GX1.5 SHARP (NEEDLE) ×3
NEEDLE HYPO 22GX1.5 SAFETY (NEEDLE) ×3 IMPLANT
NEEDLE HYPO 25X5/8 SAFETYGLIDE (NEEDLE) IMPLANT
NS IRRIG 1000ML POUR BTL (IV SOLUTION) ×3 IMPLANT
PACK C SECTION WH (CUSTOM PROCEDURE TRAY) ×3 IMPLANT
PAD ABD 7.5X8 STRL (GAUZE/BANDAGES/DRESSINGS) ×4 IMPLANT
PAD ABD DERMACEA PRESS 5X9 (GAUZE/BANDAGES/DRESSINGS) ×2 IMPLANT
PAD OB MATERNITY 4.3X12.25 (PERSONAL CARE ITEMS) ×3 IMPLANT
PENCIL SMOKE EVAC W/HOLSTER (ELECTROSURGICAL) ×3 IMPLANT
RTRCTR C-SECT PINK 25CM LRG (MISCELLANEOUS) IMPLANT
STRIP CLOSURE SKIN 1/2X4 (GAUZE/BANDAGES/DRESSINGS) ×1 IMPLANT
SUT PLAIN 2 0 XLH (SUTURE) ×2 IMPLANT
SUT VIC AB 0 CTX 36 (SUTURE) ×9
SUT VIC AB 0 CTX36XBRD ANBCTRL (SUTURE) ×3 IMPLANT
SUT VIC AB 4-0 KS 27 (SUTURE) IMPLANT
SYR 30ML LL (SYRINGE) ×3 IMPLANT
SYRINGE 10CC LL (SYRINGE) ×2 IMPLANT
TOWEL OR 17X24 6PK STRL BLUE (TOWEL DISPOSABLE) ×3 IMPLANT
TRAY FOLEY CATH SILVER 14FR (SET/KITS/TRAYS/PACK) ×3 IMPLANT

## 2015-07-23 NOTE — Progress Notes (Signed)
UR chart review completed.  

## 2015-07-23 NOTE — Anesthesia Postprocedure Evaluation (Signed)
  Anesthesia Post-op Note  Patient: [name]  Procedure(s) Performed: Procedure(s) (LRB): CESAREAN SECTION WITH BILATERAL TUBAL LIGATION (Bilateral)  Patient Location: PACU  Anesthesia Type: Spinal  Level of Consciousness: awake and alert   Airway and Oxygen Therapy: Patient Spontanous Breathing  Post-op Pain: mild  Post-op Assessment: Post-op Vital signs reviewed, Patient's Cardiovascular Status Stable, Respiratory Function Stable, Patent Airway and No signs of Nausea or vomiting  Last Vitals:  Filed Vitals:   07/23/15 1115  BP: 93/49  Temp:   Resp: 20    Post-op Vital Signs: stable   Complications: No apparent anesthesia complications

## 2015-07-23 NOTE — Interval H&P Note (Signed)
History and Physical Interval Note:  07/23/2015 9:11 AM  [name]  has presented today for surgery, with the diagnosis of REPEAT c/section  The various methods of treatment have been discussed with the patient and family. After consideration of risks, benefits and other options for treatment, the patient has consented to  Procedure(s): [place] (Bilateral) as a surgical intervention .  The patient's history has been reviewed, patient examined, no change in status, stable for surgery.  I have reviewed the patient's chart and labs.  Questions were answered to the patient's satisfaction.     [place]

## 2015-07-23 NOTE — Lactation Note (Signed)
This note was copied from the chart of [name]. Lactation Consultation Note  Patient Name: Monica Oneshia Ubaldo [id number] Date: 07/23/2015 Reason for consult: Initial assessment Mom does speak some English, FOB helps with interpreting. Baby at 7 hr of life and bf well one time. Mom was able to latch baby comfortably with the help of the RN. She stated that she knows how to manually express, no colostrum noted at this time. Mom bf her 2 older daughters for 1 month each until she thought that she was not making enough milk. Discussed milk transition, baby belly size, infant behavior, and feeding frequency. Given lactation handouts. She is aware of OP services and support group.     Maternal Data Has patient been taught Hand Expression?: Yes Does the patient have breastfeeding experience prior to this delivery?: Yes  Feeding Feeding Type: Breast Fed Length of feed: 5 min (still going)  LATCH Score/Interventions Latch: Repeated attempts needed to sustain latch, nipple held in mouth throughout feeding, stimulation needed to elicit sucking reflex. Intervention(s): Adjust position;Assist with latch  Audible Swallowing: Spontaneous and intermittent  Type of Nipple: Everted at rest and after stimulation  Comfort (Breast/Nipple): Soft / non-tender     Hold (Positioning): Assistance needed to correctly position infant at breast and maintain latch. Intervention(s): Support Pillows;Position options  LATCH Score: 8  Lactation Tools Discussed/Used     Consult Status Consult Status: Follow-up Date: 07/24/15 Follow-up type: In-patient    Denzil Hughes 07/23/2015, 5:34 PM

## 2015-07-23 NOTE — Transfer of Care (Signed)
Immediate Anesthesia Transfer of Care Note  Patient: [name]  Procedure(s) Performed: Procedure(s): CESAREAN SECTION WITH BILATERAL TUBAL LIGATION (Bilateral)  Patient Location: PACU  Anesthesia Type:Spinal  Level of Consciousness: awake, alert  and oriented  Airway & Oxygen Therapy: Patient Spontanous Breathing  Post-op Assessment: Report given to RN and Post -op Vital signs reviewed and stable  Post vital signs: Reviewed and stable  Last Vitals:  Filed Vitals:   07/23/15 0712  BP: 112/62  Temp: 36.8 C  Resp: 20    Complications: No apparent anesthesia complications

## 2015-07-23 NOTE — Op Note (Signed)
Cesarean Section Operative Report  [name]  07/23/2015  Indications: elective repeat cesarean section  Pre-operative Diagnosis: elective REPEAT c/section and bilateral tubal ligation.   Post-operative Diagnosis: Same   Surgeon: Surgeon(s) and Role:    * Donnamae Jude, MD - Primary    * Gwynne Edinger, MD - Assisting   Attending Attestation: I was present and scrubbed for the entire procedure.   Assistants: none  Anesthesia: spinal    Estimated Blood Loss: 500 ml  Total IV Fluids: 2500 ml   Urine Output:: 150 ml clear yellow urine  Specimens: none  Findings: Viable female3 infant in cephalic presentation; Apgars 9/10; weight 2700 g; arterial cord pH not obtained; clear amniotic fluid; intact placenta with three vessel cord; normal uterus, fallopian tubes and ovaries bilaterally.  Baby condition / location:  Couplet care / Skin to Skin   Complications: no complications  Indications: [name] is a 39 y.o. [id number] with an IUP [redacted]w[redacted]d presenting for elective repeat cesarean section. History cesarean section x2.  The risks, benefits, complications, treatment options, and expected outcomes were discussed with the patient . The patient concurred with the proposed plan, giving informed consent. identified as [name] and the procedure verified as C-Section Delivery.  Procedure Details:  The patient was taken back to the operative suite where spinal anesthesia was placed.  A time out was held and the above information confirmed.   After induction of anesthesia, the patient was draped and prepped in the usual sterile manner and placed in a dorsal supine position with a leftward tilt. A Pfannenstiel incision was made and carried down through the subcutaneous tissue to the fascia. Fascial incision was made and sharply extended transversely. The fascia was separated from the underlying rectus tissue superiorly and inferiorly. The peritoneum was identified and  sharply entered and then bluntly extended longitudinally. Alexis retractor was placed. One edge of bladder reflection was adhered to the lower uterine segment and this was sharply taken down, and a bladder flap created. A low transverse uterine incision was made and extended bluntly. Delivered from cephalic presentation was a viable infant with Apgars and weight as above. The umbilical cord was clamped and cut cord blood was obtained for evaluation. Cord ph was not sent. The placenta was removed Intact and appeared normal. The uterine outline, tubes and ovaries appeared normal}. The uterine incision was closed with running locked sutures of 0Vicryl without an imbricating layer. The Fallopian tubes were identified bilaterally.  A Filshie clip was placed on each tube without difficulty 3 cm from the cornua, with 0.25% marcaine infiltrated on both sides of the surrounding tube.  There was no bleeding. We then turned our attention to the portion of the uterus to which the bladder had been adhered. Bovie cautery was utilized to achieve hemostasis. One figure-of-8 suture was placed near the hysterotomy site with 0 vicryl. Surgiseal was placed. Hemostasis was observed. The peritoneum was closed with 0 vicryl. The rectus muscles were examined and hemostasis observed. The fascia was then reapproximated with running sutures of 0Vicryl. The subcuticular closure was performed using 2-0plain gut. The skin was closed with 4-0Vicryl.   Instrument, sponge, and needle counts were correct prior the abdominal closure and were correct at the conclusion of the case.     Disposition: PACU - hemodynamically stable.   Maternal Condition: stable       Signed: Ennis Forts 07/23/2015 10:39 AM

## 2015-07-23 NOTE — Anesthesia Procedure Notes (Signed)
Spinal Patient location during procedure: OR Staffing Anesthesiologist: Kenly Henckel Performed by: anesthesiologist  Preanesthetic Checklist Completed: patient identified, site marked, surgical consent, pre-op evaluation, timeout performed, IV checked, risks and benefits discussed and monitors and equipment checked Spinal Block Patient position: sitting Prep: DuraPrep Patient monitoring: continuous pulse ox, blood pressure and heart rate Approach: midline Injection technique: single-shot Needle Needle type: Sprotte  Needle gauge: 24 G Needle length: 9 cm Additional Notes Functioning IV was confirmed and monitors were applied. Sterile prep and drape, including hand hygiene, mask and sterile gloves were used. The patient was positioned and the spine was prepped. The skin was anesthetized with lidocaine.  Free flow of clear CSF was obtained prior to injecting local anesthetic into the CSF.  The spinal needle aspirated freely following injection.  The needle was carefully withdrawn.  The patient tolerated the procedure well. Consent was obtained prior to procedure with all questions answered and concerns addressed. Risks including but not limited to bleeding, infection, nerve damage, paralysis, failed block, inadequate analgesia, allergic reaction, high spinal, itching and headache were discussed and the patient wished to proceed.   Lauretta Grill, MD

## 2015-07-23 NOTE — Anesthesia Postprocedure Evaluation (Signed)
  Anesthesia Post-op Note  Patient: [name]  Procedure(s) Performed: Procedure(s): CESAREAN SECTION WITH BILATERAL TUBAL LIGATION (Bilateral)  Patient Location: PACU and Mother/Baby  Anesthesia Type:Spinal  Level of Consciousness: awake, alert  and oriented  Airway and Oxygen Therapy: Patient Spontanous Breathing  Post-op Pain: mild  Post-op Assessment: Post-op Vital signs reviewed              Post-op Vital Signs: Reviewed and stable  Last Vitals:  Filed Vitals:   07/23/15 1900  BP: 94/52  Pulse:   Temp:   Resp:     Complications: No apparent anesthesia complications

## 2015-07-23 NOTE — Consult Note (Signed)
Neonatology Note:   Attendance at C-section:    I was asked by Dr. Kennon Rounds to attend this repeat C/S at term. The mother is a G4P2A1 O pos, GBS positive with an uncomplicated pregnancy. ROM at delivery, fluid clear. Infant vigorous with good spontaneous cry and tone. Needed no suctioning. Ap 9/10. Lungs clear to ausc in DR. To CN to care of Pediatrician.   Real Cons, MD

## 2015-07-23 NOTE — Addendum Note (Signed)
Addendum  created 07/23/15 2056 by Genevie Ann, CRNA   Modules edited: Notes Section   Notes Section:  File: [id number]

## 2015-07-24 ENCOUNTER — Encounter (HOSPITAL_COMMUNITY): Payer: Self-pay | Admitting: Family Medicine

## 2015-07-24 LAB — CBC
HCT: 30.3 % — ABNORMAL LOW (ref 36.0–46.0)
HEMOGLOBIN: 10.1 g/dL — AB (ref 12.0–15.0)
MCH: 28.4 pg (ref 26.0–34.0)
MCHC: 33.3 g/dL (ref 30.0–36.0)
MCV: 85.1 fL (ref 78.0–100.0)
Platelets: 167 10*3/uL (ref 150–400)
RBC: 3.56 MIL/uL — AB (ref 3.87–5.11)
RDW: 14 % (ref 11.5–15.5)
WBC: 5.6 10*3/uL (ref 4.0–10.5)

## 2015-07-24 LAB — BIRTH TISSUE RECOVERY COLLECTION (PLACENTA DONATION)

## 2015-07-24 NOTE — Progress Notes (Signed)
[name] is a 39 y.o. [phone] who is POD # 1 s/p rLTCS with BTL at [redacted]w[redacted]d .  Subjective: Patient reports doing well this morning. Pain is tolerable with PO meds. Ambulating to bathroom without issues, reports normal voiding. Tolerating regular diet. Reports mild lochia with minimal bleeding, which is decreasing. Pertinent negatives on ROS include no blurry vision, no headache, no dyspnea, no chest pain, no epigastric or RUQ pain, no nausea or vomiting, no fevers, no hematuria, no dysuria.  Objective: Filed Vitals:   07/23/15 2030 07/24/15 0001 07/24/15 0400  BP: 102/58 108/52 100/50  Pulse: 81 81 70  Temp: 98.9 F (37.2 C) 99.3 F (37.4 C) 98.9 F (37.2 C)  Resp: 20 20 20     GEN: alert, comfortable-appearing woman resting in hospital bed, holding baby. PULM: CTAB on frontal field exam CV: RRR, S1 and S2 heard, no M/R/G appreciated ABD: fundus firm below umbilicus. Abdomen appropriately TTP. No epigastric or RUQ pain. No guarding. INCISION: Honeycomb dressing in place is C/D/I. No exudate or bleeding noted. GU: Lochia appropriate EXTR: No LE edema or calf tenderness.   Labs CBC     Status: Abnormal   Collection Time: 07/23/15  7:10 AM  Result Value Ref Range   WBC 6.4 4.0 - 10.5 K/uL   RBC 4.12 3.87 - 5.11 MIL/uL   Hemoglobin 11.7 (L) 12.0 - 15.0 g/dL   HCT 35.1 (L) 36.0 - 46.0 %   MCV 85.2 78.0 - 100.0 fL   MCH 28.4 26.0 - 34.0 pg   MCHC 33.3 30.0 - 36.0 g/dL   RDW 13.8 11.5 - 15.5 %   Platelets 187 150 - 400 K/uL  CBC     Status: Abnormal   Collection Time: 07/24/15  5:40 AM  Result Value Ref Range   WBC 5.6 4.0 - 10.5 K/uL   RBC 3.56 (L) 3.87 - 5.11 MIL/uL   Hemoglobin 10.1 (L) 12.0 - 15.0 g/dL   HCT 30.3 (L) 36.0 - 46.0 %   MCV 85.1 78.0 - 100.0 fL   MCH 28.4 26.0 - 34.0 pg   MCHC 33.3 30.0 - 36.0 g/dL   RDW 14.0 11.5 - 15.5 %   Platelets 167 150 - 400 K/uL   Assessment / Plan: [name] is a 39 y.o. [phone] who is POD #1 s/p rLTCS and BTL at [redacted]w[redacted]d    -- Pain controlled on current regimen -- Hemodynamically stable -- Tolerating regular diet -- Ambulating and voiding without issue -- no BM yet, +flatus -- Feeding method: breast and bottle -- Planned contraception method: s/p BTL  Diet: Regular PPx: No DVT ppx at this time - patient is low risk and ambulating ad lib Code Status: FULL Dispo: Continue inpatient care x 48 hours post op. If patient continues to do well may go home tomorrow or the next day.   Keane Scrape, MD 07/24/2015 8:53 AM  I have seen and examined this patient and agree the above assessment.  Respiratory effort normal, lochia appropriate, legs negative,  pain level normal.  [name],[name] 07/28/2015 11:29 AM

## 2015-07-24 NOTE — Lactation Note (Signed)
This note was copied from the chart of Monica Abbiegail Hampel. Lactation Consultation Note  Patient Name: [name] [id number] Date: 07/24/2015 Reason for consult: Follow-up assessment Mother has supplemented baby with formula due to concerns that baby is not getting enough milk at the breast. Mother has attempted  hand expression and colostrum was not observed. Mother has a history of low milk supply when breastfeeding previous babies. Interpreter in the room. Discussed s/s that infant is feeding well and transferring milk. Instructed on the use of a manual breast pump. Assisted with latch and baby suckled well for 10 mins. Mother instructed about lactation services and support post d/c. Mother will call if wants follow up assistance with LC. Encouraged to feed baby ad lib with cues and limit supplement to keep baby interested in feeding at the breast.  Maternal Data    Feeding Feeding Type: Breast Fed Length of feed: 20 min  LATCH Score/Interventions Latch: Grasps breast easily, tongue down, lips flanged, rhythmical sucking.  Audible Swallowing: A few with stimulation Intervention(s): Skin to skin  Type of Nipple: Everted at rest and after stimulation  Comfort (Breast/Nipple): Soft / non-tender     Hold (Positioning): Assistance needed to correctly position infant at breast and maintain latch. Intervention(s): Breastfeeding basics reviewed;Skin to skin  LATCH Score: 8  Lactation Tools Discussed/Used WIC Program: Yes Initiated by:: BDalyRN Date initiated:: 07/24/15   Consult Status Consult Status: Follow-up Date: 07/25/15 Follow-up type: In-patient    Stana Bunting M 07/24/2015, 2:53 PM

## 2015-07-25 ENCOUNTER — Ambulatory Visit: Payer: Self-pay

## 2015-07-25 DIAGNOSIS — O34219 Maternal care for unspecified type scar from previous cesarean delivery: Secondary | ICD-10-CM | POA: Diagnosis not present

## 2015-07-25 DIAGNOSIS — O34211 Maternal care for low transverse scar from previous cesarean delivery: Secondary | ICD-10-CM | POA: Diagnosis not present

## 2015-07-25 MED ORDER — IBUPROFEN 600 MG PO TABS: 600.0000 mg | ORAL_TABLET | Freq: Four times a day (QID) | ORAL | Status: DC | PRN

## 2015-07-25 MED ORDER — OXYCODONE-ACETAMINOPHEN 5-325 MG PO TABS: 1.0000 | ORAL_TABLET | ORAL | Status: DC | PRN

## 2015-07-25 NOTE — Discharge Instructions (Signed)
Cuidados en el postparto luego de un parto por cesrea  (Postpartum Care After Cesarean Delivery) Despus del parto (perodo de postparto), la estada normal en el hospital es de 24-72 horas. Si hubo problemas con el trabajo de parto o el parto, o si tiene otros problemas mdicos, es posible que deba permanecer en el hospital por ms tiempo.  Mientras est en el hospital, recibir ayuda e instrucciones sobre cmo cuidar de usted misma y de su beb recin nacido durante el postparto.  Mientras est en el hospital:   Es normal que sienta dolor o molestias en la incisin en el abdomen. Asegrese de decirle a las enfermeras si siente dolor, as como donde siente el dolor y qu empeora el dolor.  Si est amamantando, puede sentir contracciones dolorosas en el tero durante algunas semanas. Esto es normal. Las contracciones ayudan a que el tero vuelva a su tamao normal.  Es normal tener algo de sangrado despus del parto.  Durante los primeros 1-3 das despus del parto, el flujo es de color rojo y la cantidad puede ser similar a un perodo.  Es frecuente que el flujo se inicie y se detenga.  En los primeros das, puede eliminar algunos cogulos pequeos. Informe a las enfermeras si comienza a eliminar cogulos grandes o aumenta el flujo.  No  elimine los cogulos de sangre por el inodoro antes de que la enfermera los vea.  Durante los prximos 3 a 10 das despus del parto, el flujo debe ser ms acuoso y rosado o marrn.  De diez a catorce das despus del parto, el flujo debe ser una pequea cantidad de secrecin de color blanco amarillento.  La cantidad de flujo disminuir en las primeras semanas despus del parto. El flujo puede detenerse en 6-8 semanas. La mayora de las mujeres no tienen ms flujo a las 12 semanas despus del parto.  Usted debe cambiar sus apsitos con frecuencia.  Lvese bien las manos con agua y jabn durante al menos 20 segundos despus de cambiar el apsito, usar el  bao o antes de sostener o alimentar a su recin nacido.  Se le quitar la va intravenosa (IV) cuando ya est bebiendo suficientes lquidos.  El tubo de drenaje para la orina (catter urinario) que se inserta antes del parto puede ser retirado luego de 6-8 horas despus del parto o cuando las piernas vuelvan a tener sensibilidad. Usted puede sentir que tiene que vaciar la vejiga durante las primeras 6-8 horas despus de que le quiten el catter.  Si se siente dbil, mareada o se desmaya, llame a su enfermera antes de levantarse de la cama por primera vez y antes de tomar una ducha por primera vez.  En los primeros das despus del parto, podr sentir las mamas sensibles y llenas. Esto se llama congestin. La sensibilidad en los senos por lo general desaparece dentro de las 48-72 horas despus de que ocurre la congestin. Tambin puede notar que la leche se escapa de sus senos. Si no est amamantando no estimule sus pechos. La estimulacin de las mamas hace que sus senos produzcan ms leche.  Pasar tanto tiempo como sea posible con el beb recin nacido es muy importante. Durante este tiempo, usted y su beb deben sentirse cerca y conocerse uno al otro. Tener al beb en su habitacin (alojamiento conjunto) ayudar a fortalecer el vnculo con el beb recin nacido. Esto le dar tiempo para conocerlo y atenderlo de manera cmoda.  Las hormonas se modifican despus del parto. A   veces, los cambios hormonales pueden causar tristeza o ganas de llorar por un tiempo. Estos sentimientos no deben durar ms de unos pocos das. Si duran ms que eso, debe hablar con su mdico.  Si lo desea, hable con su mdico acerca de los mtodos de planificacin familiar o mtodos anticonceptivos.  Hable con su mdico acerca de las vacunas. El mdico puede indicarle que se aplique las siguientes vacunas antes de salir del hospital:  Vacuna contra el ttanos, la difteria y la tos ferina (Tdap) o el ttanos y la difteria (Td).  Es muy importante que usted y su familia (incluyendo a los abuelos) u otras personas que cuidan al recin nacido estn al da con las vacunas Tdap o Td. Las vacunas Tdap o Td pueden ayudar a proteger al recin nacido de enfermedades.  Inmunizacin contra la rubola.  Inmunizacin contra la varicela.  Inmunizacin contra la gripe. Usted debe recibir esta vacunacin anual si no la ha recibido durante el embarazo.   Esta informacin no tiene como fin reemplazar el consejo del mdico. Asegrese de hacerle al mdico cualquier pregunta que tenga.   Document Released: 08/15/2012 Elsevier Interactive Patient Education 2016 Elsevier Inc.  

## 2015-07-25 NOTE — Lactation Note (Signed)
This note was copied from the chart of [name]. Lactation Consultation Note  Patient Name: [name] [id number] Date: 07/25/2015 Reason for consult: Follow-up assessment;Breast/nipple pain Mom reports her breasts are sore/filling, she also has nipple tenderness. Positional stripes bilateral. On exam, Mom left breast is becoming firm, the right breast is filling, but soft. Mom reports the baby BF from the right breast last feeding. Care for sore nipples reviewed with Mom, advised to apply EBM. Comfort gels given with instructions. Engorgement care reviewed. Stressed to [employer] the importance of baby BF with each feeding, at least 8-12 times in 24 hours, both breasts before giving any bottles. Advised this will help her prevent engorgement and protect milk supply. Advised Mom to pre-pump to soften nipples before latching. Mom using cradle hold, encouraged to use football hold for more depth with latch. Attempted to BF at this visit, baby awake but would not suckle. Guidelines for supplementing per hours of age reviewed with Mom. Mom has hand pump, size 27 flange given if needed for comfort. Advised of OP services and support group. Encouraged to call for questions/concerns. In house [place] interpreter present for visit.   Maternal Data    Feeding    LATCH Score/Interventions          Comfort (Breast/Nipple): Filling, red/small blisters or bruises, mild/mod discomfort           Lactation Tools Discussed/Used Tools: Pump;Comfort gels;Flanges Flange Size: 27 Breast pump type: Manual   Consult Status Consult Status: Complete Date: 07/25/15 Follow-up type: In-patient    Katrine Coho 07/25/2015, 2:54 PM

## 2015-07-25 NOTE — Discharge Summary (Signed)
OB Discharge Summary     Patient Name: [name] DOB: 12-26-1975 MRN: [id number]  Date of admission: 07/23/2015 Delivering MD: Donnamae Jude   Date of discharge: 07/25/2015  Admitting diagnosis: cpt [phone] - REPEAT c section Intrauterine pregnancy: [redacted]w[redacted]d     Secondary diagnosis:  Principal Problem:   Previous cesarean delivery, antepartum condition or complication Active Problems:   Encounter for sterilization   Pregnancy  Additional problems: none     Discharge diagnosis: Term Pregnancy Delivered                                                                                                Post partum procedures:none  Augmentation: none  Complications: None  Hospital course:  Sceduled C/S   39 y.o. yo [id number] at [redacted]w[redacted]d was admitted to the hospital 07/23/2015 for scheduled cesarean section with the following indication:Elective Repeat.  Membrane Rupture Time/Date: 9:53 AM ,07/23/2015   Patient delivered a Viable infant.07/23/2015  Details of operation can be found in separate operative note.  Pateint had an uncomplicated postpartum course.  She is ambulating, tolerating a regular diet, passing flatus, and urinating well. Patient is discharged home in stable condition on 07/25/2015  1:57 PM          Physical exam  Filed Vitals:   07/24/15 0400 07/24/15 0810 07/24/15 1850 07/25/15 0500  BP: 100/50 106/59 116/66 109/62  Pulse: 70 72 75 64  Temp: 98.9 F (37.2 C) 98.3 F (36.8 C) 97.5 F (36.4 C) 97.5 F (36.4 C)  TempSrc:  Oral Oral Oral  Resp: 20 18 18 20   Height:      Weight:      SpO2: 98% 98%  100%   General: alert and cooperative Lochia: appropriate Uterine Fundus: firm Incision: Dressing is clean, dry, and intact DVT Evaluation: No evidence of DVT seen on physical exam. Labs: Lab Results  Component Value Date   WBC 5.6 07/24/2015   HGB 10.1* 07/24/2015   HCT 30.3* 07/24/2015   MCV 85.1 07/24/2015   PLT 167 07/24/2015   No flowsheet data  found.  Discharge instruction: per After Visit Summary and "Baby and Me Booklet".  After visit meds:    Medication List    TAKE these medications        albuterol 108 (90 BASE) MCG/ACT inhaler  Commonly known as:  PROVENTIL HFA;VENTOLIN HFA  Inhale 2 puffs into the lungs every 4 (four) hours as needed for wheezing or shortness of breath (cough, shortness of breath or wheezing.).     ibuprofen 600 MG tablet  Commonly known as:  ADVIL,MOTRIN  Take 1 tablet (600 mg total) by mouth every 6 (six) hours as needed.     oxyCODONE-acetaminophen 5-325 MG tablet  Commonly known as:  PERCOCET/ROXICET  Take 1 tablet by mouth every 4 (four) hours as needed (for pain scale 4-7).     PRENATAL VITAMIN PO  Take 1 tablet by mouth daily.        Diet: routine diet  Activity: Advance as tolerated. Pelvic rest for 6 weeks.   Outpatient follow up:6 weeks  Follow up Appt:No future appointments. Follow up Visit:No Follow-up on file.  Postpartum contraception: Tubal Ligation (s/p)  Newborn Data: Live born female  Birth Weight: 5 lb 15.2 oz (2700 g) APGAR: 9, 10  Baby Feeding: Bottle and Breast Disposition:home with mother   07/25/2015 Serita Grammes, CNM

## 2015-07-31 ENCOUNTER — Inpatient Hospital Stay (HOSPITAL_COMMUNITY)
Admission: AD | Admit: 2015-07-31 | Discharge: 2015-07-31 | Disposition: A | Discharge status: Home / Self Care | Payer: Self-pay | Source: Ambulatory Visit | Attending: Obstetrics and Gynecology | Admitting: Obstetrics and Gynecology

## 2015-07-31 ENCOUNTER — Encounter (HOSPITAL_COMMUNITY): Payer: Self-pay

## 2015-07-31 DIAGNOSIS — R51 Headache: Secondary | ICD-10-CM | POA: Insufficient documentation

## 2015-07-31 DIAGNOSIS — R030 Elevated blood-pressure reading, without diagnosis of hypertension: Secondary | ICD-10-CM | POA: Insufficient documentation

## 2015-07-31 DIAGNOSIS — G8918 Other acute postprocedural pain: Secondary | ICD-10-CM | POA: Insufficient documentation

## 2015-07-31 DIAGNOSIS — IMO0001 Reserved for inherently not codable concepts without codable children: Secondary | ICD-10-CM

## 2015-07-31 DIAGNOSIS — L7682 Other postprocedural complications of skin and subcutaneous tissue: Secondary | ICD-10-CM

## 2015-07-31 DIAGNOSIS — R208 Other disturbances of skin sensation: Secondary | ICD-10-CM

## 2015-07-31 DIAGNOSIS — O9089 Other complications of the puerperium, not elsewhere classified: Secondary | ICD-10-CM | POA: Insufficient documentation

## 2015-07-31 HISTORY — DX: Other specified health status: Z78.9

## 2015-07-31 LAB — COMPREHENSIVE METABOLIC PANEL
ALT: 26 U/L (ref 14–54)
AST: 18 U/L (ref 15–41)
Albumin: 3.2 g/dL — ABNORMAL LOW (ref 3.5–5.0)
Alkaline Phosphatase: 135 U/L — ABNORMAL HIGH (ref 38–126)
Anion gap: 7 (ref 5–15)
BILIRUBIN TOTAL: 0.3 mg/dL (ref 0.3–1.2)
BUN: 14 mg/dL (ref 6–20)
CO2: 27 mmol/L (ref 22–32)
Calcium: 9.2 mg/dL (ref 8.9–10.3)
Chloride: 105 mmol/L (ref 101–111)
Creatinine, Ser: 0.64 mg/dL (ref 0.44–1.00)
GLUCOSE: 96 mg/dL (ref 65–99)
Potassium: 3.8 mmol/L (ref 3.5–5.1)
Sodium: 139 mmol/L (ref 135–145)
Total Protein: 7.2 g/dL (ref 6.5–8.1)

## 2015-07-31 LAB — PROTEIN / CREATININE RATIO, URINE
Creatinine, Urine: 46 mg/dL
Total Protein, Urine: <6 mg/dL

## 2015-07-31 LAB — CBC
HCT: 35.5 % — ABNORMAL LOW (ref 36.0–46.0)
Hemoglobin: 11.7 g/dL — ABNORMAL LOW (ref 12.0–15.0)
MCH: 28.1 pg (ref 26.0–34.0)
MCHC: 33 g/dL (ref 30.0–36.0)
MCV: 85.3 fL (ref 78.0–100.0)
PLATELETS: 242 10*3/uL (ref 150–400)
RBC: 4.16 MIL/uL (ref 3.87–5.11)
RDW: 13.7 % (ref 11.5–15.5)
WBC: 7.7 10*3/uL (ref 4.0–10.5)

## 2015-07-31 NOTE — Discharge Instructions (Signed)
Cuidados en el postparto luego de un parto por cesrea  (Postpartum Care After Cesarean Delivery) Despus del parto (perodo de postparto), la estada normal en el hospital es de 24-72 horas. Si hubo problemas con el trabajo de parto o el parto, o si tiene otros problemas mdicos, es posible que Patent attorney en el hospital por ms [place].  Mientras est en el hospital, recibir [place] e instrucciones sobre cmo cuidar de usted misma y de su beb recin nacido durante el postparto.  Mientras est en el hospital:   Es normal que sienta dolor o molestias en la incisin en el abdomen. Asegrese de decirle a las enfermeras si Tree surgeon, as como donde Designer, television/film set y Architect.  Si est amamantando, puede sentir contracciones dolorosas en el tero durante algunas semanas. Esto es normal. Las contracciones ayudan a que el tero vuelva a su tamao normal.  Es normal tener algo de sangrado despus del [place].  Durante los primeros 1-3 das despus del parto, el flujo es de color rojo y la cantidad puede ser similar a un perodo.  Es frecuente que el flujo se inicie y se Production assistant, radio.  En los primeros [place], puede eliminar algunos cogulos pequeos. Informe a las enfermeras si comienza a eliminar cogulos grandes o aumenta el flujo.  No  elimine los cogulos de sangre por el inodoro antes de que la [employer] vea.  Durante los prximos 3 a [address] despus del parto, el flujo debe ser ms acuoso y rosado o Forensic psychologist.  Chancy Hurter a catorce [employer] del parto, el flujo debe ser una pequea cantidad de secrecin de color blanco amarillento.  La cantidad de flujo disminuir en las primeras semanas despus del parto. El flujo puede detenerse en 6-8 semanas. La mayora de las mujeres no tienen ms flujo a las 12 semanas despus del [place].  Usted debe cambiar sus apsitos con frecuencia.  Lvese bien las manos con agua y jabn durante al menos 20 segundos despus de cambiar el apsito, usar el  bao o antes de Nature conservation officer o Research scientist (life sciences) a su recin nacido.  Se le quitar la va intravenosa (IV) cuando ya est bebiendo suficientes lquidos.  El tubo de drenaje para la orina (catter urinario) que se inserta antes del parto puede ser retirado [employer] de 6-8 horas despus del parto o cuando las piernas vuelvan a Best boy sensibilidad. Usted puede sentir que tiene que vaciar la vejiga durante las primeras 6-8 horas despus de que le quiten el [place] representative.  Si se siente dbil, mareada o se desmaya, llame a su enfermera antes de levantarse de la cama por primera vez y antes de tomar una ducha por primera vez.  En los primeros das despus del parto, podr sentir las mamas sensibles y [place]. Esto se llama congestin. La sensibilidad en los senos por lo general desaparece dentro de las 48-72 horas despus de que ocurre la congestin. Tambin puede notar que la [place] se escapa de sus senos. Si no est amamantando no estimule sus pechos. La estimulacin de las mamas hace que sus senos produzcan ms [place].  Pasar tanto tiempo como sea posible con el beb recin nacido es muy importante. Durante este [place], usted y su beb deben sentirse cerca y conocerse uno al otro. Tener al beb en su habitacin (alojamiento conjunto) ayudar a fortalecer el vnculo con el beb recin nacido. Esto le dar tiempo para conocerlo y atenderlo de [place] cmoda.  Las hormonas se modifican despus del parto. A  veces, los cambios hormonales pueden causar tristeza o ganas de llorar por un tiempo. Estos sentimientos no deben durar ms de [employer]. Si duran ms que eso, debe hablar con su mdico.  Si lo desea, hable con su mdico acerca de los mtodos de planificacin familiar o mtodos anticonceptivos.  Hable con su mdico acerca de las vacunas. El mdico puede indicarle que se aplique las siguientes vacunas antes de salir del hospital:  [place] contra el ttanos, la difteria y la tos ferina (Tdap) o el ttanos y la difteria (Td).  Es muy importante que usted y su familia (incluyendo a los abuelos) u otras personas que cuidan al recin nacido estn al da con las vacunas Tdap o Td. Las vacunas Tdap o Td pueden ayudar a proteger al recin nacido de enfermedades.  Inmunizacin contra la rubola.  Inmunizacin contra la varicela.  Inmunizacin contra la gripe. Usted debe recibir esta vacunacin anual si no la ha recibido Solicitor.   Esta informacin no tiene Marine scientist el consejo del mdico. Asegrese de hacerle al mdico cualquier pregunta que tenga.   Document Released: 08/15/2012 Elsevier Interactive Patient Education 2016 [place] de alimentacin DASH (DASH Eating Plan) DASH es la sigla en ingls de "Enfoques Alimentarios para Detener la Hipertensin". El plan de alimentacin DASH ha demostrado bajar la presin arterial elevada (hipertensin). Los beneficios adicionales para la salud pueden incluir la disminucin del riesgo de diabetes mellitus tipo2, enfermedades cardacas e ictus. Este plan tambin puede ayudar a Horticulturist, commercial. QU DEBO SABER ACERCA DEL PLAN DE ALIMENTACIN DASH? Para el plan de alimentacin DASH, seguir las siguientes pautas generales:  Elija los alimentos con un valor porcentual diario de sodio de menos del 5% (segn figura en la etiqueta del alimento).  Use hierbas o aderezos sin sal, en lugar de sal de mesa o sal marina.  Consulte al mdico o farmacutico antes de usar sustitutos de la sal.  Coma productos con bajo contenido de sodio, cuya etiqueta suele decir "bajo contenido de sodio" o "sin agregado de sal".  Coma alimentos frescos.  Coma ms verduras, frutas y productos lcteos con bajo contenido de [place].  Elija los cereales integrales. Busque la palabra "integral" en Equities trader de la lista de ingredientes.  Elija el pescado y el pollo o el pavo sin piel ms a menudo que las carnes rojas. Limite el consumo de pescado, carne de ave y carne a  6onzas (170g) por Training and development officer.  Limite el consumo de dulces, postres, azcares y bebidas azucaradas.  Elija las grasas saludables para el corazn.  Limite el consumo de queso a 1onza (28g) por Training and development officer.  Consuma ms comida casera y menos de restaurante, de buf y comida rpida.  Limite el consumo de alimentos fritos.  Cocine los alimentos utilizando mtodos que no sean la fritura.  Limite las verduras enlatadas. Si las consume, enjuguelas bien para disminuir el sodio.  Cuando coma en un restaurante, pida que preparen su comida con menos sal o, en lo posible, sin nada de sal. QU ALIMENTOS PUEDO COMER? Pida ayuda a un nutricionista para conocer las necesidades calricas individuales. Cereales Pan de salvado o integral. Arroz integral. Pastas de salvado o integrales. Quinua, trigo burgol y cereales integrales. Cereales con bajo contenido de sodio. Tortillas de harina de maz o de salvado. Pan de maz integral. Galletas saladas integrales. Galletas con bajo contenido de [place]. Vegetales Verduras frescas o congeladas (crudas, al vapor, asadas o grilladas). Jugos de tomate y verduras  con contenido bajo o reducido de sodio. Pasta y salsa de tomate con contenido bajo o [place]. Verduras enlatadas con bajo contenido de sodio o reducido de sodio.  Lambert Mody Lambert Mody frescas, en conserva (en su jugo natural) o frutas congeladas. Carnes y otros productos con protenas Carne de res molida (al 85% o ms [place]), carne de res de animales alimentados con pastos o carne de res sin la grasa. Pollo o pavo sin piel. Carne de pollo o de [place]. Cerdo sin la grasa. Todos los pescados y frutos de mar. Huevos. Porotos, guisantes o lentejas secos. Frutos secos y semillas sin sal. Frijoles enlatados sin sal. Lcteos Productos lcteos con bajo contenido de grasas, como [place] o al 1%, quesos reducidos en grasas o al 2%, ricota con bajo contenido de grasas o [employer], o yogur natural con bajo  contenido de [place]. Quesos con contenido bajo o reducido de sodio. Grasas y Naval architect en barra que no contengan grasas trans. [place] y alios para ensaladas livianos o reducidos en grasas (reducidos en sodio). [place]. Aceites de crtamo, oliva o canola. [place] natural de man o almendra. Otros Palomitas de maz y pretzels sin sal. Los artculos mencionados arriba pueden no ser [employer] de las bebidas o los alimentos recomendados. Comunquese con el nutricionista para conocer ms opciones. QU ALIMENTOS NO SE RECOMIENDAN? Cereales Pan blanco. Pastas blancas. Arroz blanco. Pan de maz refinado. Bagels y croissants. Galletas saladas que contengan grasas trans. Vegetales Vegetales con crema o fritos. Verduras en [place]. Verduras enlatadas comunes. Pasta y salsa de tomate en lata comunes. Jugos comunes de tomate y de verduras. Lambert Mody Frutas secas. Fruta enlatada en almbar liviano o espeso. Jugo de frutas. Carnes y otros productos con protenas [place] de carne con Lobbyist. Costillas, alas de pollo, tocineta, salchicha, mortadela, salame, chinchulines, tocino, perros calientes, salchichas alemanas y embutidos envasados. Frutos secos y semillas con sal. Frijoles con sal en lata. Lcteos Leche entera o al 2%, crema, mezcla de [place] y crema, y queso crema. Yogur entero o endulzado. Quesos o queso azul con alto contenido de Physicist, medical. [place] no lcteas y coberturas batidas. Quesos procesados, quesos para untar o cuajadas. Condimentos Sal de cebolla y ajo, sal condimentada, sal de mesa y sal marina. Salsas en lata y envasadas. Salsa [place]. Salsa trtara. Salsa barbacoa. Salsa teriyaki. Salsa de soja, incluso la que tiene contenido reducido de [place]. Salsa de carne. Salsa de pescado. Salsa de [place]. Salsa rosada. Rbano picante. Ketchup y mostaza. Saborizantes y tiernizantes para carne. Caldo en cubitos. Salsa picante. Salsa tabasco. Adobos. Aderezos para tacos.  Salsas. Grasas y aceites [place], [place] en barra, Robbins de [place], [place], [place] clarificada y Wendee Copp de tocino. Aceites de coco, de palmiste o de palma. Aderezos comunes para ensalada. Otros Pickles y [place]. Palomitas de maz y pretzels con sal. Los artculos mencionados arriba pueden no ser [employer] de las bebidas y los alimentos que se Higher education careers adviser. Comunquese con el nutricionista para obtener ms informacin. DNDE Dolan Amen MS INFORMACIN? [place], del Pulmn y de Herbalist (National Heart, Lung, and [place]): travelstabloid.com   Esta informacin no tiene Marine scientist el consejo del mdico. Asegrese de hacerle al mdico cualquier pregunta que tenga.   Document Released: 08/18/2011 Document Revised: 09/19/2014 Elsevier Interactive Patient Education [employer].

## 2015-07-31 NOTE — MAU Note (Signed)
Patient presents with incisional drainage and abdominal pain after honeycomb gauze was removed.

## 2015-07-31 NOTE — MAU Note (Signed)
Patient had C/S on November 10th, incision opened up.

## 2015-07-31 NOTE — MAU Provider Note (Signed)
History     CSN: [id number]  Arrival date and time: 07/31/15 1315   First Provider Initiated Contact with Patient 07/31/15 1344      Chief Complaint  Patient presents with  . Post-op Problem   HPI [name] is a 39 y.o. [phone] who presents to MAU today with complaint of incision issues. The patient had a repeat LTCS and BTL on 07/23/15. She states that the home RN removed the honeycomb dressing this week. After that she noticed that the incision appeared to be open a little bit. She was also concerned that the steri-strips had blood on them because at the time of her last C/S that didn't happen. She has also had sharp abdominal pains. She is taking Ibuprofen and Tylenol PRN. She was given Percocet but hadn't filled it yet. She denies fever or upper abdominal pain, but has had some headache.   OB History    Gravida Para Term Preterm AB TAB SAB Ectopic Multiple Living   4 3 3  1  1   0 3      Past Medical History  Diagnosis Date  . Medical history non-contributory     Past Surgical History  Procedure Laterality Date  . Cesarean section with bilateral tubal ligation Bilateral 07/23/2015    Procedure: CESAREAN SECTION WITH BILATERAL TUBAL LIGATION;  Surgeon: Donnamae Jude, MD;  Location: [facility];  Service: Obstetrics;  Laterality: Bilateral;    No family history on file.  Social History  Substance Use Topics  . Smoking status: Never Smoker   . Smokeless tobacco: None  . Alcohol Use: No    Allergies: No Known Allergies  Prescriptions prior to admission  Medication Sig Dispense Refill Last Dose  . albuterol (PROVENTIL HFA;VENTOLIN HFA) 108 (90 BASE) MCG/ACT inhaler Inhale 2 puffs into the lungs every 4 (four) hours as needed for wheezing or shortness of breath (cough, shortness of breath or wheezing.). 1 Inhaler 1 prn  . ibuprofen (ADVIL,MOTRIN) 600 MG tablet Take 1 tablet (600 mg total) by mouth every 6 (six) hours as needed. 50 tablet 0 prn  .  oxyCODONE-acetaminophen (PERCOCET/ROXICET) 5-325 MG tablet Take 1 tablet by mouth every 4 (four) hours as needed (for pain scale 4-7). 50 tablet 0 prn    Review of Systems  Constitutional: Negative for fever and malaise/fatigue.  Gastrointestinal: Positive for abdominal pain.  Genitourinary:       + vaginal bleeding   Physical Exam   Blood pressure 127/68, pulse 48, temperature 98.3 F (36.8 C), temperature source Oral, resp. rate 18, last menstrual period 10/23/2014, unknown if currently breastfeeding.  Physical Exam  Nursing note and vitals reviewed. Constitutional: She is oriented to person, place, and time. She appears well-developed and well-nourished. No distress.  HENT:  Head: Normocephalic and atraumatic.  Cardiovascular: Normal rate.   Respiratory: Effort normal.  GI: Soft. She exhibits no distension and no mass. There is no tenderness. There is no rebound and no guarding.  Steri strips removed. Incision is healing well. Small amount of surrounding ecchymosis. No erythema, edema or active drainage or bleeding.   Neurological: She is alert and oriented to person, place, and time.  Skin: Skin is warm and dry. No erythema.  Psychiatric: She has a normal mood and affect.   Results for orders placed or performed during the hospital encounter of 07/31/15 (from the past 24 hour(s))  Protein / creatinine ratio, urine     Status: None   Collection Time: 07/31/15  2:35  PM  Result Value Ref Range   Creatinine, Urine 46.00 mg/dL   Total Protein, Urine <6 mg/dL   Protein Creatinine Ratio        0.00 - 0.15 mg/mg[Cre]  CBC     Status: Abnormal   Collection Time: 07/31/15  2:40 PM  Result Value Ref Range   WBC 7.7 4.0 - 10.5 K/uL   RBC 4.16 3.87 - 5.11 MIL/uL   Hemoglobin 11.7 (L) 12.0 - 15.0 g/dL   HCT 35.5 (L) 36.0 - 46.0 %   MCV 85.3 78.0 - 100.0 fL   MCH 28.1 26.0 - 34.0 pg   MCHC 33.0 30.0 - 36.0 g/dL   RDW 13.7 11.5 - 15.5 %   Platelets 242 150 - 400 K/uL  Comprehensive  metabolic panel     Status: Abnormal   Collection Time: 07/31/15  2:40 PM  Result Value Ref Range   Sodium 139 135 - 145 mmol/L   Potassium 3.8 3.5 - 5.1 mmol/L   Chloride 105 101 - 111 mmol/L   CO2 27 22 - 32 mmol/L   Glucose, Bld 96 65 - 99 mg/dL   BUN 14 6 - 20 mg/dL   Creatinine, Ser 0.64 0.44 - 1.00 mg/dL   Calcium 9.2 8.9 - 10.3 mg/dL   Total Protein 7.2 6.5 - 8.1 g/dL   Albumin 3.2 (L) 3.5 - 5.0 g/dL   AST 18 15 - 41 U/L   ALT 26 14 - 54 U/L   Alkaline Phosphatase 135 (H) 38 - 126 U/L   Total Bilirubin 0.3 0.3 - 1.2 mg/dL   GFR calc non Af Amer >60 >60 mL/min   GFR calc Af Amer >60 >60 mL/min   Anion gap 7 5 - 15   Patient Vitals for the past 24 hrs:  BP Temp Temp src Pulse Resp  07/31/15 1530 127/68 mmHg - - - 18  07/31/15 1417 149/79 mmHg - - - -  07/31/15 1353 143/78 mmHg - - (!) 48 -  07/31/15 1345 153/76 mmHg 98.3 F (36.8 C) Oral (!) 48 18    MAU Course  Procedures None  MDM Eda Royal, interpreter used during exam CBC, CMP and urine protein/creatinine ratio today due to mildly elevated blood pressure and headache Assessment and Plan  A: S/P Cesarean section and bilateral tubal ligation Incisional pain Headache Elevated blood pressure, pain related  P: Discharge home Patient advised to fill and use previously prescribed Percocet for pain Warning signs for infection or complications discussed Abdominal pads given for home use. Advised to keep area clean and dry. Discussed technique for cleaning, drying and dressing Patient advised to follow-up with [facility] as scheduled for routine postpartum exam or sooner if symptoms worsen Patient may return to MAU as needed or if her condition were to change or worsen   Luvenia Redden, PA-C  07/31/2015, 4:03 PM

## 2015-11-11 ENCOUNTER — Telehealth: Payer: Self-pay | Admitting: *Deleted

## 2015-11-11 NOTE — Telephone Encounter (Signed)
Patient phoned [name], [place] Interpreter with questions about her tubal.  Patient wants to make sure the doctor performed a tubal as she hasn't started her period.  She is concerned she might be present.  I reviewed the operative report from 07/23/15.  With Monica interpreting, I explained that the tubal was performed during her c-section.  I also explained that nothing was 100% effective to prevent pregnancy except avoidance of sexual intercourse.  I did tell patient it is rare for a patient to become pregnant after a tubal but that it is possible.  I encouraged patient to come in for pregnancy test or to perform home pregnancy test to determine whether she is pregnant.  Explained she doesn't need an appointment for a pregnancy test here.  Explained she could come in Monday-Thursday 8 am - 4 pm or Friday 8 am - 11 am.  Also told patient we could schedule an appointment for her to be evaluated for not having her period.  Patient ended call with no further questions.

## 2016-11-01 ENCOUNTER — Ambulatory Visit (INDEPENDENT_AMBULATORY_CARE_PROVIDER_SITE_OTHER): Payer: Self-pay | Admitting: Urgent Care

## 2016-11-01 ENCOUNTER — Ambulatory Visit (INDEPENDENT_AMBULATORY_CARE_PROVIDER_SITE_OTHER): Payer: Self-pay

## 2016-11-01 VITALS — BP 110/82 | HR 92 | Temp 97.9°F | Resp 18 | Ht 61.0 in | Wt 179.0 lb

## 2016-11-01 DIAGNOSIS — M542 Cervicalgia: Secondary | ICD-10-CM

## 2016-11-01 DIAGNOSIS — G44209 Tension-type headache, unspecified, not intractable: Secondary | ICD-10-CM

## 2016-11-01 DIAGNOSIS — G8929 Other chronic pain: Secondary | ICD-10-CM

## 2016-11-01 DIAGNOSIS — Z23 Encounter for immunization: Secondary | ICD-10-CM

## 2016-11-01 DIAGNOSIS — M546 Pain in thoracic spine: Secondary | ICD-10-CM

## 2016-11-01 DIAGNOSIS — M545 Low back pain: Secondary | ICD-10-CM

## 2016-11-01 MED ORDER — NAPROXEN SODIUM 550 MG PO TABS
550.0000 mg | ORAL_TABLET | Freq: Two times a day (BID) | ORAL | 1 refills | Status: DC
Start: 2016-11-01 — End: 2017-01-03

## 2016-11-01 MED ORDER — CYCLOBENZAPRINE HCL 5 MG PO TABS: 5.0000 mg | ORAL_TABLET | Freq: Three times a day (TID) | ORAL | 1 refills | Status: DC | PRN

## 2016-11-01 MED ORDER — ALBUTEROL SULFATE HFA 108 (90 BASE) MCG/ACT IN AERS: 2.0000 | INHALATION_SPRAY | Freq: Four times a day (QID) | RESPIRATORY_TRACT | 1 refills | Status: DC | PRN

## 2016-11-01 NOTE — Patient Instructions (Addendum)
Dolor de espalda en adultos (Back Pain, Adult) El dolor de espalda es muy frecuente en los adultos.La causa del dolor de espalda es rara vez peligrosa y el dolor a menudo mejora con el tiempo.Es posible que se desconozca la causa de esta afeccin. Algunas causas comunes son las siguientes:  Distensin de los msculos o ligamentos que sostienen la columna vertebral.  Desgaste (degeneracin) de los discos vertebrales.  Artritis.  Lesiones directas en la espalda. En muchas personas, el dolor de espalda es recurrente. Como rara vez es peligroso, las personas pueden aprender a manejar esta afeccin por s mismas. INSTRUCCIONES PARA EL CUIDADO EN EL HOGAR Controle su dolor de espalda a fin de detectar algn cambio. Las siguientes indicaciones ayudarn a aliviar cualquier molestia que pueda sentir:  Permanezca activo. Si permanece sentado o de pie en un mismo lugar durante mucho tiempo, se tensiona la espalda. No se siente, conduzca o permanezca de pie en un mismo lugar durante ms de 30 minutos seguidos. Realice caminatas cortas en superficies planas tan pronto como le sea posible.Trate de caminar un poco ms de tiempo cada da.  Haga ejercicio regularmente como se lo haya indicado el mdico. El ejercicio ayuda a que su espalda se cure ms rpidamente. Tambin ayuda a prevenir futuras lesiones al mantener los msculos fuertes y flexibles.  No permanezca en la cama.Si hace reposo ms de 1 a 2 das, puede demorar su recuperacin.  Preste atencin a su cuerpo al inclinarse y levantarse. Las posiciones ms cmodas son las que ejercen menos tensin en la espalda en recuperacin. Siempre use tcnicas apropiadas para levantar objetos, como por ejemplo:  Flexionar las rodillas.  Mantener la carga cerca del cuerpo.  No torcerse.  Encuentre una posicin cmoda para dormir. Use un colchn firme y recustese de costado con las rodillas ligeramente flexionadas. Si se recuesta sobre la espalda, coloque  una almohada debajo de las rodillas.  Evite sentir ansiedad o estrs.El estrs aumenta la tensin muscular y puede empeorar el dolor de espalda.Es importante reconocer si se siente ansioso o estresado y aprender maneras de controlarlo, por ejemplo haciendo ejercicio.  Tome los medicamentos solamente como se lo haya indicado el mdico. Los medicamentos de venta libre para aliviar el dolor y la inflamacin a menudo son los ms eficaces.El mdico puede recetarle relajantes musculares.Estos medicamentos ayudan a calmar el dolor de modo que pueda reanudar ms rpidamente sus actividades normales y el ejercicio saludable.  Aplique hielo sobre la zona lesionada.  Ponga el hielo en una bolsa plstica.  Coloque una toalla entre la piel y la bolsa de hielo.  Deje el hielo durante 20minutos, 2 a 3veces por da, durante los primeros 2 o 3das. Despus de eso, puede alternar el hielo y el calor para reducir el dolor y los espasmos.  Mantenga un peso saludable. El exceso de peso ejerce presin adicional sobre la espalda y hace que resulte difcil mantener una buena postura. SOLICITE ATENCIN MDICA SI:  Siente un dolor que no se alivia con reposo o medicamentos.  Siente mucho dolor que se extiende a las piernas o los glteos.  El dolor no mejora en una semana.  Siente dolor por la noche.  Pierde peso.  Siente escalofros o fiebre. SOLICITE ATENCIN MDICA DE INMEDIATO SI:  Tiene nuevos problemas para controlar la vejiga o los intestinos.  Siente debilidad o adormecimiento inusuales en los brazos o en las piernas.  Siente nuseas o vmitos.  Siente dolor abdominal.  Siente que va a desmayarse. Esta informacin   Esta informacin no tiene como fin reemplazar el consejo del mdico. Asegrese de hacerle al mdico cualquier pregunta que tenga. Document Released: 08/29/2005 Document Revised: 09/19/2014 Document Reviewed: 12/31/2013 Elsevier Interactive Patient Education  2017 Elsevier Inc.     IF  you received an x-ray today, you will receive an invoice from [facility]. Please contact [facility] at [phone] with questions or concerns regarding your invoice.   IF you received labwork today, you will receive an invoice from [facility]. Please contact [facility] at [phone] with questions or concerns regarding your invoice.   Our billing staff will not be able to assist you with questions regarding bills from these companies.  You will be contacted with the lab results as soon as they are available. The fastest way to get your results is to activate your My Chart account. Instructions are located on the last page of this paperwork. If you have not heard from us regarding the results in 2 weeks, please contact this office.     

## 2016-11-01 NOTE — Progress Notes (Signed)
MRN: [id number] DOB: 05/15/1976  Subjective:   [name] is a 41 y.o. female presenting for chief complaint of Back Pain; Headache; and Arm Pain  Reports longstanding history of back mid back pain that radiates upwards into neck and arms bilaterally in the past 2 weeks. She is also now having associated headaches, occasional numbness and tingling of her hands bilaterally. Has had difficulty sleeping due to her pain. Has tried Advil, ibuprofen. Drinks water inconsistently. Denies fever, weakness, recent trauma. She did suffer a bad fall from 2003, fell from 2nd floor onto 1st floor, landed on her back. Her bed has 5 years on it, is soft pillow mattress and has to use multiple pillows under her back for relief. Admits longstanding back pain in general since she received an epidural from her last child birth. She does lift heavy items daily as she lives on the 2nd floor.   Chequita is not currently taking any medications. Also has No Known Allergies.  Liylah  has a past medical history of Medical history non-contributory. Also  has a past surgical history that includes Cesarean section with bilateral tubal ligation (Bilateral, 07/23/2015).  Objective:   Vitals: BP 110/82 (BP Location: Right Arm, Patient Position: Sitting, Cuff Size: Small)   Pulse 92   Temp 97.9 F (36.6 C) (Oral)   Resp 18   Ht 5\' 1"  (1.549 m)   Wt 179 lb (81.2 kg)   LMP 10/31/2016   SpO2 98%   BMI 33.82 kg/m   Physical Exam  Constitutional: She is oriented to person, place, and time. She appears well-developed and well-nourished.  Cardiovascular: Normal rate.   Pulmonary/Chest: Effort normal.  Musculoskeletal:       Cervical back: She exhibits tenderness. She exhibits normal range of motion, no bony tenderness, no swelling, no edema, no deformity and no spasm.       Thoracic back: She exhibits tenderness. She exhibits normal range of motion, no bony tenderness, no swelling, no edema, no deformity, no laceration and  no spasm.       Lumbar back: She exhibits tenderness. She exhibits normal range of motion, no bony tenderness, no swelling, no edema, no deformity, no laceration and no spasm.  Neurological: She is alert and oriented to person, place, and time. She displays normal reflexes. No cranial nerve deficit. Coordination normal.  Skin: Skin is warm and dry.   Dg Cervical Spine Complete  Result Date: 11/01/2016 CLINICAL DATA:  Neck pain and back pain.  No time course given. EXAM: CERVICAL SPINE - COMPLETE 4+ VIEW COMPARISON:  None. FINDINGS: The cervical vertebral bodies are normally aligned. Disc spaces and vertebral bodies are maintained. No significant degenerative changes. No acute bony findings or abnormal prevertebral soft tissue swelling. The facets are normally aligned. The neural foramen are patent. The C1-2 articulations are maintained. The lung apices are clear. IMPRESSION: Normal alignment and no acute bony findings or significant degenerative changes. Electronically Signed   By: Marijo Sanes M.D.   On: 11/01/2016 10:59    Assessment and Plan :   1. Neck pain 2. Chronic midline thoracic back pain 3. Chronic bilateral low back pain without sciatica 4. Tension-type headache, not intractable, unspecified chronicity pattern - Patient did not want to repeat x-rays for her thoracic and lumbar back. I suspect that her level of activity coupled with her old soft mattress are contributing to her chronic back pain. I advised she hydrate better, buy a firm mattress. Use massage therapy in conjunction with medical  therapy. Recheck in 2 weeks if no improvement, consider short steroid course, further work up.  5. Need for prophylactic vaccination and inoculation against influenza - Flu Vaccine QUAD 36+ mos IM  Patient requested a refill of her albuterol inhaler at the end of her visit. She uses this for allergic bronchospasms very occasionally as flared up by dust allergens.  Jaynee Eagles, PA-C [facility] [id number] 11/01/2016  10:30 AM

## 2017-01-03 ENCOUNTER — Ambulatory Visit (INDEPENDENT_AMBULATORY_CARE_PROVIDER_SITE_OTHER): Payer: Self-pay | Admitting: Urgent Care

## 2017-01-03 ENCOUNTER — Ambulatory Visit (INDEPENDENT_AMBULATORY_CARE_PROVIDER_SITE_OTHER): Payer: Self-pay

## 2017-01-03 VITALS — BP 113/71 | HR 104 | Temp 98.0°F | Resp 16 | Ht 60.0 in | Wt 176.0 lb

## 2017-01-03 DIAGNOSIS — R079 Chest pain, unspecified: Secondary | ICD-10-CM

## 2017-01-03 DIAGNOSIS — J9801 Acute bronchospasm: Secondary | ICD-10-CM

## 2017-01-03 DIAGNOSIS — R0789 Other chest pain: Secondary | ICD-10-CM

## 2017-01-03 DIAGNOSIS — R0602 Shortness of breath: Secondary | ICD-10-CM

## 2017-01-03 DIAGNOSIS — R059 Cough, unspecified: Secondary | ICD-10-CM

## 2017-01-03 DIAGNOSIS — R05 Cough: Secondary | ICD-10-CM

## 2017-01-03 LAB — POCT CBC
Granulocyte percent: 56.5 %G (ref 37–80)
HCT, POC: 39.1 % (ref 37.7–47.9)
Hemoglobin: 13 g/dL (ref 12.2–16.2)
Lymph, poc: 2.8 (ref 0.6–3.4)
MCH, POC: 26.9 pg — AB (ref 27–31.2)
MCHC: 33.2 g/dL (ref 31.8–35.4)
MCV: 80.9 fL (ref 80–97)
MID (CBC): 0.7 (ref 0–0.9)
MPV: 9 fL (ref 0–99.8)
PLATELET COUNT, POC: 242 10*3/uL (ref 142–424)
POC Granulocyte: 4.5 (ref 2–6.9)
POC LYMPH %: 35.2 % (ref 10–50)
POC MID %: 8.3 %M (ref 0–12)
RBC: 4.83 M/uL (ref 4.04–5.48)
RDW, POC: 14.6 %
WBC: 7.9 10*3/uL (ref 4.6–10.2)

## 2017-01-03 MED ORDER — IPRATROPIUM BROMIDE 0.02 % IN SOLN
0.5000 mg | Freq: Once | RESPIRATORY_TRACT | Status: AC
  Administered 2017-01-03: 0.5 mg via RESPIRATORY_TRACT

## 2017-01-03 MED ORDER — PREDNISONE 20 MG PO TABS: 40.0000 mg | ORAL_TABLET | Freq: Every day | ORAL | 0 refills | Status: DC

## 2017-01-03 MED ORDER — ALBUTEROL SULFATE (2.5 MG/3ML) 0.083% IN NEBU
2.5000 mg | INHALATION_SOLUTION | Freq: Once | RESPIRATORY_TRACT | Status: AC
  Administered 2017-01-03: 2.5 mg via RESPIRATORY_TRACT

## 2017-01-03 NOTE — Patient Instructions (Addendum)
IF you received an x-ray today, you will receive an invoice from [facility]. Please contact [facility] at [phone] with questions or concerns regarding your invoice.   IF you received labwork today, you will receive an invoice from [facility]. Please contact [facility] at [phone] with questions or concerns regarding your invoice.   Our billing staff will not be able to assist you with questions regarding bills from these companies.  You will be contacted with the lab results as soon as they are available. The fastest way to get your results is to activate your My Chart account. Instructions are located on the last page of this paperwork. If you have not heard from [place] regarding the results in 2 weeks, please contact this office.      Broncoespasmo - Adultos (Bronchospasm, Adult) Broncoespasmo significa que hay un espasmo o restriccin de las vas areas que llevan el aire a los pulmones. Durante el broncoespasmo, la respiracin se hace ms difcil debido a que las vas respiratorias se contraen. Cuando esto ocurre, puede haber tos, un silbido al respirar (sibilancias) presin en el pecho y dificultad para respirar. Generalmente se asocia al asma, pero no todos los pacientes que experimentan broncoespasmos sufren asma. CAUSAS La causa del broncoespasmo es la inflamacin o la irritacin de las vas respiratorias. La inflamacin o la irritacin pueden haber sido desencadenadas por:  Set designer (por ejemplo a animales, polen, alimentos y moho). Los alrgenos que causan el broncoespasmo pueden producir sibilancias inmediatamente despus de la exposicin, o varias horas despus.  Infeccin. Se considera que la causa ms frecuente son las infecciones virales.  Ejercicios.  Irritantes (como la polucin, humo de cigarrillos, olores fuertes, Nature conservation officer y vapores de [place]).  Los cambios climticos. El viento aumenta la cantidad de moho y polen del aire. La lluvia limpia  el aire arrastrando las sustancias irritantes. El aire fro puede causar inflamacin.  Estrs y [employer]. [place].  Tos excesiva durante la noche.  Tos frecuente o intensa durante un resfro comn.  Opresin en el pecho.  Falta de aire. DIAGNSTICO El broncoespasmo se diagnostica con la historia clnica y un examen fsico. En algunos casos se indican otros estudios, como radiografas de trax, para Clinical research associate. TRATAMIENTO  Le indicarn medicamentos por va inhalatoria para abrir las vas respiratorias y ayudarlo a Ambulance person. Los medicamentos se administran por medio de Educational psychologist o Furniture conservator/restorer.  Le administrarn corticoides en los casos de broncoespasmo grave, generalmente cuando se asocia al asma. INSTRUCCIONES PARA EL CUIDADO EN EL HOGAR  Siempre tenga un plan preparado para pedir asistencia mdica. Sepa cuando debe llamar al mdico y a los servicios de emergencia de su localidad (911 en [place]). Sepa donde puede acceder a un servicio de emergencias.  Tome todos los medicamentos como le indic el mdico.  Si le indicaron el uso de un inhalador o nebulizador, consulte a su mdico para que le explique cmo usarlo correctamente. Siempre use un espaciador con el inhalador, si le indicaron su uso.  Es Manufacturing systems engineer muy tranquilo durante el ataque. Trate de relajarse y respirar ms lentamente.  Controle el ambiente del hogar del siguiente modo:  Cambie el filtro de la calefaccin y del aire acondicionado al menos una vez al mes.  Limite el uso de hogares o estufas a lea.  No fume y nopermita que fumen en su hogar.  Evite la exposicin a perfumes y fragancias.  Elimine las plagas (como cucarachas, ratones) y sus excrementos.  Elimine las plantas si observa moho en ellas.  Mantenga su casa limpia y [place].  Reemplace las alfombras por pisos de [place], baldosas o vinilo. Las alfombras pueden retener las  escamas o pelos de los animales y [place].  Use almohadas, mantas y cubre colchones antialrgicos.  Lorenzo sbanas y las mantas todas las semanas con agua caliente y squelas con aire caliente.  Use mantas de [place] o algodn.  Lvese las manos con frecuencia. SOLICITE ATENCIN MDICA SI:  Tiene dolores musculares.  Siente dolor en el pecho.  El esputo cambia de un color claro o blanco a un color amarillo, verde, gris o sanguinolento.  El esputo que elimina es ms espeso.  Tiene problemas relacionados con el medicamento que recibe. como urticaria, picazn, hinchazn o dificultades respiratorias. Coin DE INMEDIATO SI:  [place] sibilancias y la tos, an despus de tomar los medicamentos recetados.  Tiene una creciente dificultad para respirar.  Siente un dolor intenso en el pecho. ASEGRESE DE QUE:  Comprende estas instrucciones.  Controlar su afeccin.  Recibir ayuda de inmediato si no mejora o si empeora. Esta informacin no tiene Marine scientist el consejo del mdico. Asegrese de hacerle al mdico cualquier pregunta que tenga. Document Released: 12/06/2007 Document Revised: 09/19/2014 Document Reviewed: 02/18/2013 Elsevier Interactive Patient Education  2017 [employer].

## 2017-01-03 NOTE — Progress Notes (Signed)
MRN: [id number] DOB: 08/29/1976  Subjective:   [name] is a 41 y.o. female presenting for chief complaint of Shortness of Breath and Chest Pain  Reports 1 week history of dry cough, shortness of breath, chest tightness. Cough elicits sore throat, headache. Also has throat wheezing, chest pain that radiates to her back. Denies fever, n/v, abdominal pain, radiation of her chest pain into her arm, jaw or neck.   [name] has a current medication list which includes the following prescription(s): albuterol, cyclobenzaprine, and naproxen sodium. Also has No Known Allergies.  [name]  has a past medical history of Medical history non-contributory. Also  has a past surgical history that includes Cesarean section with bilateral tubal ligation (Bilateral, 07/23/2015).  Objective:   Vitals: BP 113/71 (BP Location: Left Arm, Patient Position: Sitting, Cuff Size: Large)   Pulse (!) 104   Temp 98 F (36.7 C) (Oral)   Resp 16   Ht 5' (1.524 m)   Wt 176 lb (79.8 kg)   LMP 11/01/2016   SpO2 96%   BMI 34.37 kg/m   Physical Exam  Constitutional: She is oriented to person, place, and time. She appears well-developed and well-nourished.  HENT:  Mouth/Throat: Oropharynx is clear and moist.  Eyes: No scleral icterus.  Neck: Normal range of motion. Neck supple.  Cardiovascular: Normal rate, regular rhythm and intact distal pulses.  Exam reveals no gallop and no friction rub.   No murmur heard. Pulmonary/Chest: No respiratory distress. She has wheezes (mild expiratory wheezing). She has no rales.  Abdominal: Soft. Bowel sounds are normal. She exhibits no distension and no mass. There is no tenderness. There is no guarding.  Lymphadenopathy:    She has no cervical adenopathy.  Neurological: She is alert and oriented to person, place, and time.  Skin: Skin is warm and dry.   ECG interpretation - normal sinus rhythm at 86 bpm.   Dg Chest 2 View  Result Date: 01/03/2017 CLINICAL DATA:  Cough,  atypical chest pain EXAM: CHEST  2 VIEW COMPARISON:  None. FINDINGS: No active infiltrate or effusion is seen. There is mild peribronchial thickening which may indicate bronchitis. Mediastinal and hilar contours are unremarkable. The heart is within normal limits in size. No bony abnormality is seen. IMPRESSION: No pneumonia or effusion.  Question bronchitis. Electronically Signed   By: Ivar Drape M.D.   On: 01/03/2017 16:50   Results for orders placed or performed in visit on 01/03/17 (from the past 24 hour(s))  POCT CBC     Status: Abnormal   Collection Time: 01/03/17  4:51 PM  Result Value Ref Range   WBC 7.9 4.6 - 10.2 K/uL   Lymph, poc 2.8 0.6 - 3.4   POC LYMPH PERCENT 35.2 10 - 50 %L   MID (cbc) 0.7 0 - 0.9   POC MID % 8.3 0 - 12 %M   POC Granulocyte 4.5 2 - 6.9   Granulocyte percent 56.5 37 - 80 %G   RBC 4.83 4.04 - 5.48 M/uL   Hemoglobin 13.0 12.2 - 16.2 g/dL   HCT, POC 39.1 37.7 - 47.9 %   MCV 80.9 80 - 97 fL   MCH, POC 26.9 (A) 27 - 31.2 pg   MCHC 33.2 31.8 - 35.4 g/dL   RDW, POC 14.6 %   Platelet Count, POC 242 142 - 424 K/uL   MPV 9.0 0 - 99.8 fL   Assessment and Plan :   1. Bronchospasm 2. Shortness of breath 3. Atypical  chest pain 4. Cough - Will have patient start short steroid course. Counseled on appropriate use of albuterol inhaler. Referral to pulmonology is pending.  Jaynee Eagles, PA-C [facility] [phone] 01/03/2017  4:20 PM

## 2017-01-04 LAB — COMPREHENSIVE METABOLIC PANEL
ALK PHOS: 91 IU/L (ref 39–117)
ALT: 11 IU/L (ref 0–32)
AST: 16 IU/L (ref 0–40)
Albumin/Globulin Ratio: 1.4 (ref 1.2–2.2)
Albumin: 4.3 g/dL (ref 3.5–5.5)
BUN/Creatinine Ratio: 15 (ref 9–23)
BUN: 11 mg/dL (ref 6–24)
Bilirubin Total: <0.2 mg/dL (ref 0.0–1.2)
CHLORIDE: 100 mmol/L (ref 96–106)
CO2: 25 mmol/L (ref 18–29)
Calcium: 9.6 mg/dL (ref 8.7–10.2)
Creatinine, Ser: 0.72 mg/dL (ref 0.57–1.00)
GFR calc Af Amer: 120 mL/min/{1.73_m2} (ref >59)
GFR calc non Af Amer: 104 mL/min/{1.73_m2} (ref >59)
GLOBULIN, TOTAL: 3.1 g/dL (ref 1.5–4.5)
GLUCOSE: 111 mg/dL — AB (ref 65–99)
POTASSIUM: 4.2 mmol/L (ref 3.5–5.2)
Sodium: 139 mmol/L (ref 134–144)
Total Protein: 7.4 g/dL (ref 6.0–8.5)

## 2017-01-05 ENCOUNTER — Encounter: Payer: Self-pay | Admitting: Urgent Care

## 2017-01-10 ENCOUNTER — Other Ambulatory Visit: Payer: Self-pay

## 2017-01-10 ENCOUNTER — Ambulatory Visit (INDEPENDENT_AMBULATORY_CARE_PROVIDER_SITE_OTHER): Payer: Self-pay | Admitting: Pulmonary Disease

## 2017-01-10 ENCOUNTER — Encounter: Payer: Self-pay | Admitting: Pulmonary Disease

## 2017-01-10 VITALS — BP 112/60 | HR 81 | Resp 14 | Ht 62.0 in | Wt 178.6 lb

## 2017-01-10 DIAGNOSIS — R0602 Shortness of breath: Secondary | ICD-10-CM

## 2017-01-10 LAB — NITRIC OXIDE: NITRIC OXIDE: 73

## 2017-01-10 MED ORDER — BUDESONIDE-FORMOTEROL FUMARATE 160-4.5 MCG/ACT IN AERO: 2.0000 | INHALATION_SPRAY | Freq: Two times a day (BID) | RESPIRATORY_TRACT | 0 refills | Status: DC

## 2017-01-10 MED ORDER — FLUTICASONE FUROATE-VILANTEROL 200-25 MCG/INH IN AEPB: 1.0000 | INHALATION_SPRAY | Freq: Every day | RESPIRATORY_TRACT | 6 refills | Status: DC

## 2017-01-10 MED ORDER — FLUTICASONE FUROATE-VILANTEROL 200-25 MCG/INH IN AEPB: 1.0000 | INHALATION_SPRAY | Freq: Every day | RESPIRATORY_TRACT | 0 refills | Status: DC

## 2017-01-10 NOTE — Progress Notes (Signed)
[name]    [id number]    1975-09-30  Primary Care Physician:No PCP Per Patient  Referring Physician: Jaynee Eagles, PA-C [place], [place] [address]  Chief complaint:  Consult for evaluation of asthma  HPI: 41 Y/O with no significant PMH. C/O dyspnea, wheezing. No cough or sputum production She is sensitive on exposure to dust. Has daily night time awakening. She uses albuterol several times/day during exacerbations. She has not been on any other inhaler. She is an immigrant from [place] 19 year ago with no recent travel there.  She was evaluated by her primary care last week and was given a shot prednisone course of 40 mg daily. She's finished her last tablet yesterday.  Pets: No pets or sensitivity to cats dogs Occupation: Music therapist Exposures: Sensitive to dust. Not sensitive to cleaning products, strong perfumes. Occasional exposure to mold. No carpet at home, has hardwood floors. She has forced air heating/furnace. She changes filters regularly Smoking history: Never smoker  Outpatient Encounter Prescriptions as of 01/10/2017  Medication Sig  . albuterol (PROVENTIL HFA;VENTOLIN HFA) 108 (90 Base) MCG/ACT inhaler Inhale 2 puffs into the lungs every 6 (six) hours as needed for wheezing or shortness of breath (cough, shortness of breath or wheezing.).  Marland Kitchen cyclobenzaprine (FLEXERIL) 5 MG tablet Take 1 tablet (5 mg total) by mouth 3 (three) times daily as needed.  . predniSONE (DELTASONE) 20 MG tablet Take 2 tablets (40 mg total) by mouth daily with breakfast.   No facility-administered encounter medications on file as of 01/10/2017.     Allergies as of 01/10/2017  . (No Known Allergies)    Past Medical History:  Diagnosis Date  . Medical history non-contributory     Past Surgical History:  Procedure Laterality Date  . CESAREAN SECTION WITH BILATERAL TUBAL LIGATION Bilateral 07/23/2015   Procedure: CESAREAN SECTION WITH BILATERAL TUBAL LIGATION;   Surgeon: Donnamae Jude, MD;  Location: [facility];  Service: Obstetrics;  Laterality: Bilateral;    No family history on file.  Social History   Social History  . Marital status: Single    Spouse name: N/A  . Number of children: N/A  . Years of education: N/A   Occupational History  . Not on file.   Social History Main Topics  . Smoking status: Never Smoker  . Smokeless tobacco: Never Used  . Alcohol use No  . Drug use: No  . Sexual activity: Yes   Other Topics Concern  . Not on file   Social History Narrative  . No narrative on file    Review of systems: Review of Systems  Constitutional: Negative for fever and chills.  HENT: Negative.   Eyes: Negative for blurred vision.  Respiratory: as per HPI  Cardiovascular: Negative for chest pain and palpitations.  Gastrointestinal: Negative for vomiting, diarrhea, blood per rectum. Genitourinary: Negative for dysuria, urgency, frequency and hematuria.  Musculoskeletal: Negative for myalgias, back pain and joint pain.  Skin: Negative for itching and rash.  Neurological: Negative for dizziness, tremors, focal weakness, seizures and loss of consciousness.  Endo/Heme/Allergies: Negative for environmental allergies.  Psychiatric/Behavioral: Negative for depression, suicidal ideas and hallucinations.  All other systems reviewed and are negative.  Physical Exam: Blood pressure 112/60, pulse 81, resp. rate 14, height 5\' 2"  (1.575 m), weight 178 lb 9.6 oz (81 kg), SpO2 96 %, unknown if currently breastfeeding. Gen:      No acute distress HEENT:  EOMI, sclera anicteric  Neck:     No masses; no thyromegaly Lungs:    Clear to auscultation bilaterally; normal respiratory effort CV:         Regular rate and rhythm; no murmurs Abd:      + bowel sounds; soft, non-tender; no palpable masses, no distension Ext:    No edema; adequate peripheral perfusion Skin:      Warm and dry; no rash Neuro: alert and oriented x 3 Psych: normal mood and  affect  Data Reviewed: FENO 01/10/17- 73  Chest x-ray 01/03/17-no acute cardiopulmonary abnormality. I reviewed the images personally.  Assessment:  Asthma Poorly controlled and she has high airway inflammation as shown by elevated FENO. We will start breo 200 and continue albuterol PRN Check CBC with diff, blood allergy profile, PFTs. As she just got off the prednisone we will wait to get these tests done at the time of next visit.   Plan/Recommendations: - Breo, continue albuterol PRN - CBC with diff, PFTs, blood allergy profile before next visit.   Marshell Garfinkel MD [facility] Pager [phone] 01/10/2017, 4:37 PM  CC: Jaynee Eagles, PA-C

## 2017-01-10 NOTE — Patient Instructions (Signed)
We will check CBC with diff, blood allergy profile PFTs Start Symbicort 160/4.5

## 2017-05-09 ENCOUNTER — Ambulatory Visit: Payer: Self-pay | Admitting: Pulmonary Disease

## 2017-08-15 ENCOUNTER — Encounter: Payer: Self-pay | Admitting: Urgent Care

## 2017-08-15 ENCOUNTER — Ambulatory Visit: Payer: Self-pay | Admitting: Urgent Care

## 2017-08-15 VITALS — BP 117/74 | HR 82 | Temp 98.5°F | Resp 18 | Ht 62.0 in | Wt 188.2 lb

## 2017-08-15 DIAGNOSIS — G8929 Other chronic pain: Secondary | ICD-10-CM

## 2017-08-15 DIAGNOSIS — M545 Low back pain, unspecified: Secondary | ICD-10-CM

## 2017-08-15 DIAGNOSIS — H5789 Other specified disorders of eye and adnexa: Secondary | ICD-10-CM

## 2017-08-15 DIAGNOSIS — R519 Headache, unspecified: Secondary | ICD-10-CM

## 2017-08-15 DIAGNOSIS — E860 Dehydration: Secondary | ICD-10-CM

## 2017-08-15 DIAGNOSIS — H1013 Acute atopic conjunctivitis, bilateral: Secondary | ICD-10-CM

## 2017-08-15 DIAGNOSIS — M546 Pain in thoracic spine: Secondary | ICD-10-CM

## 2017-08-15 DIAGNOSIS — R51 Headache: Secondary | ICD-10-CM

## 2017-08-15 DIAGNOSIS — H02849 Edema of unspecified eye, unspecified eyelid: Secondary | ICD-10-CM

## 2017-08-15 NOTE — Progress Notes (Signed)
  MRN: [id number] DOB: [birth year]  Subjective:   [name] is a 41 y.o. female presenting for 2 week history of persistent eye redness, itching. She has not tried any medications for relief. Denies fever, eye drainage, photosensitivity, blurred vision, eye trauma. Has a history of allergies, asthma. She is not taking any medications for either. She also has frequent headaches, chronic back pain. She has been seen here for both. Has failed muscle relaxants, NSAID therapy, steroid course. Imaging and work up has been normal. Admits that she does not drink water very well at all. Sometimes goes days without drinking even a glass of water. As a result, patient is worried about her kidney function and would like to have this checked. Denies smoking cigarettes.   [name] is not currently taking any medications and has No Known Allergies.  [name] of asthma, bronchospasm, allergies, chronic back pain. Also  has a past surgical history that includes Cesarean section with bilateral tubal ligation (Bilateral, 07/23/2015).  Objective:   Vitals: BP 117/74   Pulse 82   Temp 98.5 F (36.9 C) (Oral)   Resp 18   Ht 5\' 2"  (1.575 m)   Wt 188 lb 3.2 oz (85.4 kg)   SpO2 97%   BMI 34.42 kg/m   Physical Exam  Constitutional: She is oriented to person, place, and time. She appears well-developed and well-nourished.  HENT:  TM's intact bilaterally, no effusions or erythema. Nasal turbinates pink and moist, nasal passages patent. No sinus tenderness. Oropharynx clear, mucous membranes moist.   Eyes: EOM are normal. Pupils are equal, round, and reactive to light. Right eye exhibits no chemosis, no discharge, no exudate and no hordeolum. No foreign body present in the right eye. Left eye exhibits no chemosis, no discharge, no exudate and no hordeolum. No foreign body present in the left eye. Right conjunctiva is injected (mildly). Right conjunctiva has no hemorrhage. Left conjunctiva is injected (mildly). Left  conjunctiva has no hemorrhage. No scleral icterus.  Cardiovascular: Normal rate.  Pulmonary/Chest: Effort normal.  Neurological: She is alert and oriented to person, place, and time.  Skin: Skin is warm and dry.   Assessment and Plan :   1. Allergic conjunctivitis of both eyes 2. Redness of both eyes 3. Swelling of eyelid, unspecified laterality - Will manage with Opcon A, Zyrtec. Return-to-clinic precautions discussed, patient verbalized understanding. Consider antibiotic course if her symptoms fail to resolve.  4. Chronic midline thoracic back pain 5. Chronic bilateral low back pain without sciatica 6. Frequent headaches 7. Dehydration - Counseled on need for adequate hydration. I suspect this may be having a large impact on her qol. She agreed to try. Labs pending. - Basic metabolic panel   Jaynee Eagles, PA-C [facility] [phone] 08/15/2017  3:07 PM

## 2017-08-15 NOTE — Patient Instructions (Addendum)
Para alergia y comezon de tus ojos, por favor [place] Opcon A dos veces diaramente. Tambien toma Zyrtec 10mg  diaramente. Mas que todo debes tomar 2 litros de agua (1 galon) todos los dias. Tome 500mg  de Tylenol con ibuprofen 400-600mg  con comida cada 6 horas con comida para dolor y inflammacion.     Conjuntivitis alrgica en los adultos (Allergic Conjunctivitis, Adult) La conjuntivitis alrgica es la inflamacin de la membrana transparente que cubre la parte blanca del ojo y la cara interna del prpado (conjuntiva). La inflamacin es producida por Set designer. Los vasos sanguneos en la conjuntiva se inflaman y el ojo se vuelve de color rojo o rosa. Esto suele causar picazn en los ojos. La conjuntivitis alrgica no se puede diseminar de Ardelia Mems persona a otra (no es contagiosa). CAUSAS La causa de esta afeccin es una reaccin alrgica. Entre las cosas comunes de una reaccin IT consultant (alrgenos), se incluyen las siguientes:  Alrgenos externos, por ejemplo: ? Polen. ? Pasto y [place]. ? Esporas del moho.  Alrgenos de interiores, por ejemplo: ? Polvo. ? Humo. ? Progreso mascotas. ? Pelos de animales. FACTORES DE RIESGO Es ms probable que desarrolle este tipo de afeccin si tiene antecedentes familiares de Set designer, por ejemplo:  Rinitis alrgica.  Asma bronquial.  Dermatitis atpica. SNTOMAS Los sntomas de esta afeccin incluyen ojos:  Con picazn.  Rojos.  Lagrimosos.  Hinchados. Los ojos tambin pueden:  Arder o causar un dolor punzante.  Producir una secrecin transparente. DIAGNSTICO Este trastorno se puede diagnosticar mediante la historia clnica y un examen fsico. Si tiene secrecin en el ojo, esta se puede analizar para descartar otras causas de conjuntivitis. Tambin es posible que deba ver a un mdico que se especialice en tratar Personnel officer) o afecciones en los ojos (oftalmlogo) para que Electrical engineer estudios a fin de Physicist, medical  diagnstico. Algunos de los estudios que probablemente deba tener que realizarse son los siguientes:  Pruebas de piel para Hydrographic surveyor qu alrgenos causan los sntomas. Para estas pruebas, se pincha la piel con una aguja diminuta para exponerla a pequeas cantidades de posibles alrgenos y ver si la piel reacciona.  Anlisis de [place].  Raspados de tejido del prpado. Se examinarn con un microscopio. TRATAMIENTO El tratamiento de esta afeccin pueden incluir lo siguiente:  Paos fros (compresas) para Human resources officer picazn y la hinchazn.  Lavar la cara para eliminar alrgenos.  Gotas oftlmicas. Estas pueden ser recetadas o de venta libre. Hay varios tipos diferentes. Tal vez necesite probar diferentes tipos para ver cul funciona mejor para usted. Puede necesitar: ? Gotas oftlmicas que bloquean la Risk analyst (antihistamnicos). ? Gotas oftlmicas que reducen la hinchazn y la irritacin (antiinflamatorios). ? Gotas oftlmicas con corticoides para reducir una reaccin grave (conjuntivitis vernal).  Antihistamnicos orales para reducir Production designer, theatre/television/film. Puede necesitarlos si las gotas oftlmicas no ayudan o son difciles de Training and development officer. INSTRUCCIONES PARA EL CUIDADO EN EL HOGAR  Evite los alrgenos conocidos, siempre que sea posible.  Tome o aplquese los medicamentos de venta libre y recetados solamente como se lo haya indicado el mdico. Estos incluyen cualquier gota oftlmica.  Aplquese un pao fro y limpio en el ojo durante 10 a 20 minutos, 3 a 4 veces al da.  No se toque ni se frote los ojos.  No use lentes de contacto hasta que la inflamacin haya desaparecido. En su lugar, use anteojos.  No use maquillaje en los ojos hasta que la inflamacin haya desaparecido.  Concurra a  todas las visitas de control como se lo haya indicado el mdico. Esto es importante. SOLICITE ATENCIN MDICA SI:  Los sntomas empeoran o no mejoran con Dispensing optician.  Tiene dolor leve en el  ojo.  Tiene sensibilidad a Naval architect.  Tiene manchas o ampollas en los ojos.  Le supura pus del ojo.  Tiene fiebre. SOLICITE ATENCIN MDICA DE INMEDIATO SI:  Tiene enrojecimiento, hinchazn u otros sntomas en un solo ojo.  Tiene visin borrosa o cambios en la visin.  Siente dolor intenso en el ojo. Esta informacin no tiene Marine scientist el consejo del mdico. Asegrese de hacerle al mdico cualquier pregunta que tenga. Document Released: 08/29/2005 Document Revised: 09/19/2014 Document Reviewed: 03/11/2016 Elsevier Interactive Patient Education  2018 [employer].     IF you received an x-ray today, you will receive an invoice from [facility]. Please contact [facility] at [phone] with questions or concerns regarding your invoice.   IF you received labwork today, you will receive an invoice from [facility]. Please contact [facility] at [phone] with questions or concerns regarding your invoice.   Our billing staff will not be able to assist you with questions regarding bills from these companies.  You will be contacted with the lab results as soon as they are available. The fastest way to get your results is to activate your My Chart account. Instructions are located on the last page of this paperwork. If you have not heard from [place] regarding the results in 2 weeks, please contact this office.

## 2017-08-16 LAB — BASIC METABOLIC PANEL
BUN / CREAT RATIO: 18 (ref 9–23)
BUN: 12 mg/dL (ref 6–24)
CO2: 25 mmol/L (ref 20–29)
CREATININE: 0.65 mg/dL (ref 0.57–1.00)
Calcium: 9.6 mg/dL (ref 8.7–10.2)
Chloride: 102 mmol/L (ref 96–106)
GFR, EST AFRICAN AMERICAN: 128 mL/min/{1.73_m2} (ref >59)
GFR, EST NON AFRICAN AMERICAN: 111 mL/min/{1.73_m2} (ref >59)
Glucose: 98 mg/dL (ref 65–99)
POTASSIUM: 4.3 mmol/L (ref 3.5–5.2)
SODIUM: 138 mmol/L (ref 134–144)

## 2018-01-11 ENCOUNTER — Encounter (HOSPITAL_COMMUNITY): Payer: Self-pay | Admitting: Emergency Medicine

## 2018-01-11 ENCOUNTER — Emergency Department (HOSPITAL_COMMUNITY)
Admission: EM | Admit: 2018-01-11 | Discharge: 2018-01-12 | Disposition: A | Discharge status: Home / Self Care | Payer: No Typology Code available for payment source | Attending: Emergency Medicine | Admitting: Emergency Medicine

## 2018-01-11 DIAGNOSIS — M542 Cervicalgia: Secondary | ICD-10-CM | POA: Diagnosis present

## 2018-01-11 DIAGNOSIS — J45909 Unspecified asthma, uncomplicated: Secondary | ICD-10-CM | POA: Diagnosis not present

## 2018-01-11 DIAGNOSIS — W2210XA Striking against or struck by unspecified automobile airbag, initial encounter: Secondary | ICD-10-CM | POA: Diagnosis not present

## 2018-01-11 DIAGNOSIS — Y9389 Activity, other specified: Secondary | ICD-10-CM | POA: Insufficient documentation

## 2018-01-11 DIAGNOSIS — Y92410 Unspecified street and highway as the place of occurrence of the external cause: Secondary | ICD-10-CM | POA: Diagnosis not present

## 2018-01-11 DIAGNOSIS — Y998 Other external cause status: Secondary | ICD-10-CM | POA: Diagnosis not present

## 2018-01-11 DIAGNOSIS — M549 Dorsalgia, unspecified: Secondary | ICD-10-CM | POA: Diagnosis not present

## 2018-01-11 DIAGNOSIS — S0990XA Unspecified injury of head, initial encounter: Secondary | ICD-10-CM | POA: Insufficient documentation

## 2018-01-11 NOTE — ED Notes (Signed)
Adult charge RN made aware of patients status. Mom now c/o L lower side ab pain and is rubbing her neck.
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
## 2018-01-12 ENCOUNTER — Emergency Department (HOSPITAL_COMMUNITY): Payer: No Typology Code available for payment source

## 2018-01-12 MED ORDER — HYDROCODONE-ACETAMINOPHEN 5-325 MG PO TABS
2.0000 | ORAL_TABLET | Freq: Once | ORAL | Status: AC
  Administered 2018-01-12: 2 via ORAL
  Filled 2018-01-12 (×2): qty 2

## 2018-01-12 MED ORDER — IBUPROFEN 800 MG PO TABS: 800.0000 mg | ORAL_TABLET | Freq: Three times a day (TID) | ORAL | 0 refills | Status: DC

## 2018-01-12 MED ORDER — CYCLOBENZAPRINE HCL 10 MG PO TABS: 10.0000 mg | ORAL_TABLET | Freq: Two times a day (BID) | ORAL | 0 refills | Status: DC | PRN
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
## 2019-07-04 ENCOUNTER — Other Ambulatory Visit: Payer: Self-pay

## 2019-07-04 DIAGNOSIS — Z20822 Contact with and (suspected) exposure to covid-19: Secondary | ICD-10-CM

## 2019-07-06 LAB — NOVEL CORONAVIRUS, NAA: SARS-CoV-2, NAA: NOT DETECTED

## 2019-12-26 ENCOUNTER — Ambulatory Visit: Payer: Self-pay | Admitting: Family Medicine

## 2020-01-06 ENCOUNTER — Ambulatory Visit: Payer: Self-pay

## 2020-01-06 ENCOUNTER — Ambulatory Visit (INDEPENDENT_AMBULATORY_CARE_PROVIDER_SITE_OTHER): Payer: Self-pay | Admitting: Family Medicine

## 2020-01-06 ENCOUNTER — Other Ambulatory Visit: Payer: Self-pay

## 2020-01-06 ENCOUNTER — Encounter: Payer: Self-pay | Admitting: Family Medicine

## 2020-01-06 VITALS — BP 120/82 | HR 65 | Temp 97.9°F | Ht 62.0 in | Wt 194.0 lb

## 2020-01-06 DIAGNOSIS — M79671 Pain in right foot: Secondary | ICD-10-CM

## 2020-01-06 DIAGNOSIS — N6320 Unspecified lump in the left breast, unspecified quadrant: Secondary | ICD-10-CM

## 2020-01-06 DIAGNOSIS — M722 Plantar fascial fibromatosis: Secondary | ICD-10-CM

## 2020-01-06 DIAGNOSIS — J302 Other seasonal allergic rhinitis: Secondary | ICD-10-CM

## 2020-01-06 DIAGNOSIS — M79672 Pain in left foot: Secondary | ICD-10-CM

## 2020-01-06 NOTE — Progress Notes (Signed)
4/26/202110:52 AM  [name] 10/27/1975, 44 y.o., female [id number]  Chief Complaint  Patient presents with  . L breast lump    x 2 months  . congestion in throat    usually in am  . Headache    back aches  . itchy eyes    watery     HPI:   Patient is a 44 y.o. female who presents today with several concerns  Noticed a lump in her left breast in feb 2021 Not changing size, at times having pain in left breast No nipple discharge No skin changes Denies lymphadenopathy Denies fhx breast or ovarian cancer She does not smoke She has never had mammogram done  Having worsening seasonal allergies Very itchy eyes, swelling, blurry vision Having post nasal drip Taking zyrtec and OTC allergy eye drops  Having bilateral heel pain Had injections done over 1-2 years ago Injections did not help much    Hearing Screening   125Hz  250Hz  500Hz  1000Hz  2000Hz  3000Hz  4000Hz  6000Hz  8000Hz   Right ear:           Left ear:             Visual Acuity Screening   Right eye Left eye Both eyes  Without correction: 2025 2025 2020  With correction:       Depression screen [facility] 2/9 01/06/2020 01/03/2017 11/01/2016  Decreased Interest 0 0 0  Down, Depressed, Hopeless 0 0 0  PHQ - 2 Score 0 0 0    Fall Risk  01/06/2020 11/01/2016 03/04/2015 01/22/2015  Falls in the past year? 0 No No No  Number falls in past yr: 0 - - -  Injury with Fall? 0 - - -  Follow up Falls evaluation completed - - -     No Known Allergies  Prior to Admission medications   Medication Sig Start Date End Date Taking? Authorizing Provider  ibuprofen (ADVIL,MOTRIN) 800 MG tablet Take 1 tablet (800 mg total) by mouth 3 (three) times daily. 01/12/18  Yes Montine Circle, PA-C  cyclobenzaprine (FLEXERIL) 10 MG tablet Take 1 tablet (10 mg total) by mouth 2 (two) times daily as needed for muscle spasms. Patient not taking: Reported on 01/06/2020 01/12/18   Montine Circle, PA-C    Past Medical History:  Diagnosis  Date  . Allergy   . Asthma   . Chronic back pain   . Medical history non-contributory     Past Surgical History:  Procedure Laterality Date  . CESAREAN SECTION WITH BILATERAL TUBAL LIGATION Bilateral 07/23/2015   Procedure: CESAREAN SECTION WITH BILATERAL TUBAL LIGATION;  Surgeon: Donnamae Jude, MD;  Location: [facility];  Service: Obstetrics;  Laterality: Bilateral;    Social History   Tobacco Use  . Smoking status: Never Smoker  . Smokeless tobacco: Never Used  Substance Use Topics  . Alcohol use: No    No family history on file.  Review of Systems  Constitutional: Negative for chills and fever.  Respiratory: Negative for cough and shortness of breath.   Cardiovascular: Negative for chest pain, palpitations and leg swelling.  Gastrointestinal: Negative for abdominal pain, nausea and vomiting.  per hpi   OBJECTIVE:  Today's Vitals   01/06/20 1025  BP: 120/82  Pulse: 65  Temp: 97.9 F (36.6 C)  SpO2: 97%  Weight: 194 lb (88 kg)  Height: 5\' 2"  (1.575 m)   Body mass index is 35.48 kg/m.   Physical Exam Vitals and nursing note reviewed. Exam conducted with a  chaperone present.  Constitutional:      Appearance: She is well-developed.  HENT:     Head: Normocephalic and atraumatic.     Mouth/Throat:     Pharynx: No oropharyngeal exudate.  Eyes:     General: No scleral icterus.    Conjunctiva/sclera: Conjunctivae normal.     Pupils: Pupils are equal, round, and reactive to light.  Cardiovascular:     Rate and Rhythm: Normal rate and regular rhythm.     Heart sounds: Normal heart sounds. No murmur. No friction rub. No gallop.   Pulmonary:     Effort: Pulmonary effort is normal.     Breath sounds: Normal breath sounds. No wheezing, rhonchi or rales.  Chest:     Breasts:        Right: No inverted nipple, mass, nipple discharge or skin change.        Left: Mass (large solid mobile mass at ~ 1 oclock) and tenderness present. No inverted nipple, nipple discharge or  skin change.  Musculoskeletal:     Cervical back: Neck supple.  Lymphadenopathy:     Upper Body:     Right upper body: No supraclavicular, axillary or pectoral adenopathy.     Left upper body: No supraclavicular, axillary or pectoral adenopathy.  Skin:    General: Skin is warm and dry.  Neurological:     Mental Status: She is alert and oriented to person, place, and time.     No results found for this or any previous visit (from the past 24 hour(s)).  DG Ankle Complete Left  Result Date: 01/06/2020 CLINICAL DATA:  Left ankle pain. EXAM: LEFT ANKLE COMPLETE - 3+ VIEW COMPARISON:  None. FINDINGS: The ankle mortise is maintained. No ankle fracture or osteochondral abnormality. No definite ankle joint effusion. The mid and hindfoot bony structures are intact. Moderate pes cavus. IMPRESSION: No acute bony findings or degenerative changes. Electronically Signed   By: Marijo Sanes M.D.   On: 01/06/2020 11:40   DG Ankle Complete Right  Result Date: 01/06/2020 CLINICAL DATA:  Right ankle pain. EXAM: RIGHT ANKLE - COMPLETE 3+ VIEW COMPARISON:  None. FINDINGS: The ankle mortise is maintained. No ankle fracture or osteochondral abnormality. No definite ankle joint effusion. The mid and hindfoot bony structures are intact. Moderate pes cavus. IMPRESSION: No acute bony findings. Electronically Signed   By: Marijo Sanes M.D.   On: 01/06/2020 11:41     ASSESSMENT and PLAN  1. Left breast mass - MM DIAG BREAST TOMO UNI LEFT; Future - MM Digital Screening Unilat R; Future  2. Seasonal allergies Discussed adding flonase and oral decongestants  3. Plantar fasciitis, bilateral 4. Heel pain, bilateral - DG Ankle Complete Left - DG Ankle Complete Right Discussed supportive measures. Patient educational handout given.    Return for after mammo.    Rutherford Guys, MD [facility] at [facility], [place] [address] Ph.  [phone] Fax [phone]

## 2020-01-06 NOTE — Patient Instructions (Addendum)
Fascitis plantar, rehabilitacin Plantar Fasciitis Rehab Pregunte al mdico qu ejercicios son seguros para usted. Haga los ejercicios exactamente como se lo haya indicado el mdico y gradelos como se lo hayan indicado. Es normal sentir un estiramiento leve, tironeo, opresin o Tree surgeon al [employer] [place]. Detngase de inmediato si siente un dolor repentino o Printmaker. No comience a hacer estos ejercicios hasta que se lo indique el mdico. Ejercicios de elongacin y amplitud de movimiento Estos ejercicios calientan los msculos y las articulaciones, y mejoran la movilidad y la flexibilidad del pie. Adems, ayudan a Best boy. Estiramiento de la fascia plantar  1. Sintese con la pierna izquierda/derecha cruzada sobre la rodilla opuesta. 2. Sostenga el taln con [place], con el pulgar cerca del arco. Con la otra mano, sostenga los dedos de los pies y empjelos con [place]. Debe sentir un estiramiento en la parte de abajo de los dedos o del pie (fascia plantar), o de ambos. 3. Mantenga esta posicin durante _________ segundos. 4. Afloje lentamente los dedos y vuelva a la posicin inicial. Repita __________ veces. Realice este ejercicio __________ veces al da. Estiramiento de los gemelos, de pie Este ejercicio tambin se denomina estiramiento de la pantorrilla (los msculos gemelos). Estira los msculos posteriores de la parte superior de la pantorrilla. 1. Prese con las manos USAA pared. 2. Extienda la pierna izquierda/derecha hacia atrs y flexione ligeramente la rodilla de la pierna de adelante. 3. Mantenga los talones apoyados en el suelo y la rodilla de atrs extendida, y lleve el peso hacia la pared. No arquee la espalda. Debe sentir un estiramiento suave en la parte superior de la pantorrilla izquierda/derecha. 4. Mantenga esta posicin durante __________ segundos. Repita __________ veces. Realice este ejercicio __________ veces al  da. Estiramiento del msculo sleo, de pie Este ejercicio tambin se denomina estiramiento de la pantorrilla (sleo). Estira los msculos posteriores de la parte inferior de la pantorrilla. 1. Prese con las manos USAA pared. 2. Extienda la pierna izquierda/derecha hacia atrs y flexione ligeramente la rodilla de la pierna de adelante. 3. Mantenga los talones apoyados en el suelo, flexione la rodilla de atrs y lleve el peso ligeramente a la pierna de atrs. Debe sentir un estiramiento suave en la parte profunda de la parte inferior de la pantorrilla. 4. Mantenga esta posicin durante __________ segundos. Repita __________ veces. Realice este ejercicio __________ veces al da. Estiramiento de los Apple [employer] gemelos y sleo, de pie con un escaln Este ejercicio estira los msculos posteriores de la parte inferior de la pierna. Estos msculos se encuentran en la parte superior de la pantorrilla (gastrocnemio) y la parte inferior de la pantorrilla (sleo). 1. Prese sobre un escaln apoyando solo la regin metatarsiana de su pie derecho/izquierdo. La regin metatarsiana del pie es la superficie sobre la que caminamos, justo debajo de los dedos. 2. Mantenga el otro pie apoyado con firmeza en el mismo escaln. 3. Sostngase de la pared o de una baranda para mantener el equilibrio. 4. Levante lentamente el otro pie y deje que el peso del cuerpo presione el taln izquierdo/derecho sobre el borde del escaln. Debe sentir un estiramiento en la pantorrilla izquierda/derecha. 5. Mantenga esta posicin durante __________ segundos. 6. Vuelva a poner ambos pies sobre el escaln. 7. Repita este ejercicio con una leve flexin en la rodilla izquierda/derecha. Reptalo __________ veces con la rodilla izquierda/derecha extendida y __________ veces con la rodilla izquierda/derecha flexionada. Realice este ejercicio __________ veces al da. Ejercicio de  equilibrio Este ejercicio aumenta el equilibrio y el  control de la fuerza del arco, para ayudar a reducir la presin sobre la fascia plantar. Pararse sobre una pierna Si este ejercicio es muy fcil, puede intentar hacerlo con los ojos cerrados o parado sobre [place]. 1. Sin calzado, prese cerca de una baranda o [place]. Puede sostenerse de la baranda o del marco de la puerta, segn lo necesite. 2. Prese sobre el pie izquierdo/derecho. Sin despegar el dedo gordo del suelo, intente mantener el arco levantado. No deje que el pie se vaya hacia adentro. 3. Mantenga esta posicin durante __________ segundos. Repita __________ veces. Realice este ejercicio __________ veces al da. Esta informacin no tiene Marine scientist el consejo del mdico. Asegrese de hacerle al mdico cualquier pregunta que tenga. Document Revised: 08/01/2018 Document Reviewed: 08/01/2018 Elsevier Patient Education  [place]. Fascitis plantar Plantar Fasciitis  La fascitis plantar es una afeccin dolorosa que se produce en el taln. Ocurre cuando la banda de tejido que Exelon Corporation dedos con el hueso del taln (fascia plantar) se irrita. Esto puede ocurrir por Financial planner ejercicio u otras actividades repetitivas (lesin por uso excesivo). El dolor de la fascitis plantar puede abarcar desde una leve irritacin a dolor intenso, y en los casos ms agudos puede dificultar que la persona camine o se [place]. Por lo general, el dolor es peor a la maana despus de dormir, o despus de Public affairs consultant sentado o acostado durante un tiempo. El dolor tambin puede empeorar despus de caminar o estar de pie por [employer]. Cules son las causas? Esta afeccin puede ser causada por lo siguiente:  Estar de pie durante largos perodos.  Usar zapatos que no tengan un buen soporte para el arco.  Practicar actividades que ponen tensin en las articulaciones (actividades de alto impacto), como correr, Field seismologist [place] [employer] administrator.  Tener sobrepeso.  Tener  una forma de caminar (un andar) anormal.  Presentar rigidez muscular en la parte posterior de la parte inferior de la pierna (pantorrilla).  Tener un arco alto de los pies.  Comenzar una nueva actividad fsica. Cules son los signos o los sntomas? El sntoma principal de esta afeccin es el dolor en el taln. El dolor:  Puede empeorar con los primeros pasos luego de estar en reposo, especialmente por la maana despus de dormir o de haber estado sentado o acostado durante [place].  Puede empeorar despus de largos perodos de estar parado.  Puede disminuir despus de 30 a 45 minutos de [place], como caminar apaciblemente. Cmo se diagnostica? Esta afeccin puede diagnosticarse en funcin de sus antecedentes mdicos y sus sntomas. Su mdico puede preguntarle sobre su nivel de [place]. El Special educational needs teacher un examen fsico para controlar si tiene lo siguiente:  Un rea dolorida en la parte inferior del pie.  El arco alto.  Dolor al Agricultural consultant.  Dificultad para mover el pie. Pueden realizarle estudios de diagnstico por imagen para confirmar el diagnstico, por ejemplo:  Radiografas.  Ecografa.  Resonancia magntica (RM). Cmo se trata? El tratamiento de la fascitis plantar depende de la gravedad de su afeccin. El tratamiento puede incluir lo siguiente:  Reposo, hielo, presin (compresin) y Lexicographer el pie afectado (elevacin). Esto puede conocerse como [place]ia de RHCE (Reposo, hielo, compresin, elevacin). El mdico puede recomendarle Terapia de RHCE junto con medicamentos de venta libre para Best boy.  Ejercicios para [place] pantorrillas y la fascia plantar.  Una frula que [employer]es  pie estirado y [place] mientras usted duerme (frula nocturna).  Fisioterapia para [employer] sntomas y Customer service manager en el futuro.  Inyecciones de corticoesteroides para Best boy y la inflamacin.  Estimular su fascia plantar lesionada  con impulsos elctricos (tratamiento con ondas de choque extracorprea). Esto generalmente es la ltima opcin de tratamiento antes de la [place].  [place], si los otros tratamientos no han funcionado despus de 80meses. Siga estas indicaciones en su casa:  Control del dolor, la rigidez y la hinchazn  Si se lo indican, aplique hielo sobre la zona del dolor: ? Ponga el hielo en una bolsa de plstico o use una botella de agua congelada. ? Coloque una [employer] piel y la bolsa de hielo o la botella. ? Frote la parte inferior del pie sobre la bolsa o la botella. ? Haga esto durante 77minutos, de 2a 3veces al SunTrust.  Use calzado deportivo con amortiguacin de aire o gel, o intente usar plantillas blandas diseadas para la fascitis plantar.  Cuando est sentado o acostado, levante (eleve) el pie por encima del nivel del corazn. Actividad  Evite las actividades que causan dolor. Pregntele al mdico qu actividades son seguras para usted.  Haga los ejercicios de fisioterapia y estiramiento como se lo haya indicado el mdico.  Intente hacer actividades y tipos de ejercicio que sean ms fciles para las articulaciones (de bajo impacto). Por ejemplo, nadar, hacer ejercicios [employer]orporated, y Catering manager. Instrucciones generales  [employer]phi de venta libre y los recetados solamente como se lo haya indicado el mdico.  Si el mdico se lo indica, use una frula nocturna para dormir. Afloje la frula si los dedos de los pies se le entumecen, siente hormigueos o se le enfran y se tornan de Optician, dispensing.  Mantenga un peso saludable, o colabore con su mdico para perder [employer].  Concurra a todas las visitas de control como se lo haya indicado el mdico. Esto es importante. Comunquese con un mdico si:  Tiene sntomas que no desaparecen despus tratarlos en su casa.  Siente un dolor que [place].  Siente un dolor que afecta su capacidad de moverse o de Optometrist sus  actividades diarias. Resumen  La fascitis plantar es una afeccin dolorosa que se produce en el taln. Ocurre cuando la banda de tejido que Exelon Corporation dedos con el hueso del taln (fascia plantar) se irrita.  El sntoma principal de esta afeccin es Conservation officer, historic buildings en el taln que puede empeorar despus de hacer mucho ejercicio o de Public affairs consultant parado por mucho tiempo.  El tratamiento vara, pero por lo general comienza con reposo, hielo, compresin, y elevacin (Tratamiento de RHCE) y los medicamentos de venta libre para Financial controller. Esta informacin no tiene Marine scientist el consejo del mdico. Asegrese de hacerle al mdico cualquier pregunta que tenga. Document Revised: 12/09/2017 Document Reviewed: 08/20/2017 Elsevier Patient Education  [place] [employer].     If you have lab work done today you will be contacted with your lab results within the next 2 weeks.  If you have not heard from [place] then please contact us. The fastest way to get your results is to register for My Chart.   IF you received an x-ray today, you will receive an invoice from [facility]. Please contact [facility] at [phone] with questions or concerns regarding your invoice.   IF you received labwork today, you will receive an invoice from [place]. Please contact [facility] at [phone]  with questions or concerns regarding your invoice.   Our billing staff will not be able to assist you with questions regarding bills from these companies.  You will be contacted with the lab results as soon as they are available. The fastest way to get your results is to activate your My Chart account. Instructions are located on the last page of this paperwork. If you have not heard from [place] regarding the results in 2 weeks, please contact this office.

## 2020-01-17 ENCOUNTER — Other Ambulatory Visit: Payer: Self-pay

## 2020-01-17 DIAGNOSIS — N6320 Unspecified lump in the left breast, unspecified quadrant: Secondary | ICD-10-CM

## 2020-01-23 ENCOUNTER — Ambulatory Visit
Admission: RE | Admit: 2020-01-23 | Discharge: 2020-01-23 | Disposition: A | Discharge status: Home / Self Care | Payer: No Typology Code available for payment source | Source: Ambulatory Visit | Attending: Obstetrics and Gynecology | Admitting: Obstetrics and Gynecology

## 2020-01-23 ENCOUNTER — Ambulatory Visit: Payer: No Typology Code available for payment source | Admitting: *Deleted

## 2020-01-23 ENCOUNTER — Other Ambulatory Visit: Payer: Self-pay

## 2020-01-23 ENCOUNTER — Other Ambulatory Visit: Payer: Self-pay | Admitting: Obstetrics and Gynecology

## 2020-01-23 VITALS — BP 126/88 | Temp 97.3°F | Wt 197.0 lb

## 2020-01-23 DIAGNOSIS — N6325 Unspecified lump in the left breast, overlapping quadrants: Secondary | ICD-10-CM

## 2020-01-23 DIAGNOSIS — Z1239 Encounter for other screening for malignant neoplasm of breast: Secondary | ICD-10-CM

## 2020-01-23 DIAGNOSIS — N6320 Unspecified lump in the left breast, unspecified quadrant: Secondary | ICD-10-CM

## 2020-01-23 DIAGNOSIS — N644 Mastodynia: Secondary | ICD-10-CM

## 2020-01-23 NOTE — Progress Notes (Signed)
[name] is a 44 y.o. female who presents to [facility] clinic today with complaints of left upper breast pain and lump. Patient states the pain comes and goes. Patient rates the pain at a 8 out of 10.   Pap Smear: Pap not smear completed today. Last Pap smear was in March 2019 at a clinic on [address] and was normal per patient. Per patient has no history of an abnormal Pap smear. No Pap smear results are in Epic.   Physical exam: Breasts Right breast slightly larger than left breast that per patient is normal for her. No skin abnormalities bilateral breasts. No nipple retraction bilateral breasts. No nipple discharge bilateral breasts. No lymphadenopathy. No lumps palpated right breast. Palpated a lump within the left breast at 12 o'clock above the areola. Complaints of left upper breast pain on exam.    Pelvic/Bimanual Pap is not indicated today per BCCCP guidelines.   Smoking History: Patient has never smoked.   Patient Navigation: Patient education provided. Access to services provided for patient through [place] program. [place] interpreter [name] from [facility] provided.   Breast and Cervical Cancer Risk Assessment: Patient has no family history of breast cancer, known genetic mutations, or radiation treatment to the chest before age 82. Patient has no history of cervical dysplasia, immunocompromised, or DES exposure in-utero.  Risk Assessment    Risk Scores      01/23/2020   Last edited by: Monica Revel, LPN   5-year risk: 0.7 %   Lifetime risk: 9.4 %          A: BCCCP exam without pap smear   P: Referred patient to the [facility] for a diagnostic mammogram and left breast ultrasound. Appointment scheduled for Thursday, Jan 23, 2020 at 1240.   Monica Parish, RN 01/23/2020 12:00 PM

## 2020-01-23 NOTE — Patient Instructions (Signed)
Explained breast self awareness with [name]. Patient did not need a Pap smear today due to last Pap smear was in March 2019 per patient. Let her know BCCCP will cover Pap smears every 3 years unless has a history of abnormal Pap smears. Referred patient to the [facility] for a diagnostic mammogram and left breast ultrasound. Appointment scheduled for Thursday, Jan 23, 2020 at 1240. Patient aware of appointment and will be there. [name] verbalized understanding.  Satara Virella, Arvil Chaco, RN 12:01 PM

## 2020-01-24 ENCOUNTER — Telehealth: Payer: Self-pay | Admitting: Oncology

## 2020-01-24 NOTE — Telephone Encounter (Signed)
Spoke to patient w/ assistance of interpreter to confirm afternoon [facility] appointment on 12/19, spanish packet sent through email

## 2020-01-27 ENCOUNTER — Encounter: Payer: Self-pay | Admitting: *Deleted

## 2020-01-27 ENCOUNTER — Other Ambulatory Visit: Payer: Self-pay | Admitting: *Deleted

## 2020-01-27 DIAGNOSIS — Z170 Estrogen receptor positive status [ER+]: Secondary | ICD-10-CM | POA: Insufficient documentation

## 2020-01-27 DIAGNOSIS — C50412 Malignant neoplasm of upper-outer quadrant of left female breast: Secondary | ICD-10-CM | POA: Insufficient documentation

## 2020-01-28 NOTE — Progress Notes (Signed)
[facility]  [phone]) [phone] Fax:(336) [phone]     ID: [name] DOB: 05-10-1976  MR#: [id number]  KPV#:[id number]  Patient Care Team: Rutherford Guys, MD as PCP - General (Family Medicine) Rolm Bookbinder, MD as Consulting Physician (General Surgery) Sukaina Toothaker, Virgie Dad, MD as Consulting Physician (Oncology) Eppie Gibson, MD as Attending Physician (Radiation Oncology) Rockwell Germany, RN as Oncology Nurse Navigator Mauro Kaufmann, RN as Oncology Nurse Navigator Chauncey Cruel, MD OTHER MD:  CHIEF COMPLAINT: Triple negative breast cancer  CURRENT TREATMENT: Neoadjuvant chemotherapy   HISTORY OF CURRENT ILLNESS: [name] herself palpated a left beast lump with associated pain. She underwent bilateral diagnostic mammography with tomography and left breast ultrasonography at [facility] on 01/23/2020 showing: breast density category B; palpable 3.6 cm left breast mass with associated calcifications at 1 o'clock; 0.8 cm left breast mass at 2 o'clock, concerning for abnormal intramammary lymph node; at least two abnormal left axillary lymph nodes.  Accordingly on 01/23/2020 she proceeded to biopsy of the left breast area in question. 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 Prognostic indicators significant for: estrogen receptor, 20% positive with moderate staining intensity and progesterone receptor, 0% negative. Proliferation marker Ki67 at 40%. HER2 negative by immunohistochemistry (1+).  The left axillary lymph node confirmed metastatic carcinoma.  The left intramammary lymph node was negative for carcinoma.  The patient's subsequent history is as detailed below.   INTERVAL HISTORY: [name] was evaluated in the multidisciplinary breast cancer clinic on 01/29/2020 accompanied by her husband Celfin. Her case was also presented at the multidisciplinary breast cancer conference  on the same day. At that time a preliminary plan was proposed: Neoadjuvant chemotherapy, followed by definitive surgery and radiation; genetics   REVIEW OF SYSTEMS: The patient has a headache history consistent with migraines.  This has not recently changed.  She needs reading glasses.  Otherwise she denies, visual changes, nausea, vomiting, stiff neck, dizziness, or gait imbalance. There has been no cough, phlegm production, or pleurisy, no chest pain or pressure, and no change in bowel or bladder habits. The patient denies fever, rash, bleeding, unexplained fatigue or unexplained weight loss. A detailed review of systems was otherwise entirely negative.   PAST MEDICAL HISTORY: Past Medical History:  Diagnosis Date  . Allergy   . Asthma   . Chronic back pain   . Hx of migraines   . Medical history non-contributory     PAST SURGICAL HISTORY: Past Surgical History:  Procedure Laterality Date  . CESAREAN SECTION WITH BILATERAL TUBAL LIGATION Bilateral 07/23/2015   Procedure: CESAREAN SECTION WITH BILATERAL TUBAL LIGATION;  Surgeon: Donnamae Jude, MD;  Location: [facility];  Service: Obstetrics;  Laterality: Bilateral;  . TUBAL LIGATION      FAMILY HISTORY: No family history on file.  Her parents are both living as of 01/2020, her father age 33 and her mother age 7. She has three maternal half-siblings (1 sister, 2 brothers) and four paternal half-siblings (1 sister, 3 brothers). There is no family history of cancer to her knowledge.   GYNECOLOGIC HISTORY:  Patient's last menstrual period was 01/20/2020 (exact date). Menarche: 44 years old Age at first live birth: 44 years old Duncan P 3 LMP 01/2020 Contraceptive s/p tubal ligation HRT n/a  Hysterectomy? no BSO? 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 Husband Vaughan Sine works for  a Financial trader, both in [employer]. They are both  from [place]. She lives at home with Booth and their three children-- [name], Barwick, and [name]. She attends an [employer].    ADVANCED DIRECTIVES: In the absence of any documentation to the contrary, the patient's spouse is their HCPOA.    HEALTH MAINTENANCE: Social History   Tobacco Use  . Smoking status: Never Smoker  . Smokeless tobacco: Never Used  Substance Use Topics  . Alcohol use: No  . Drug use: No     Colonoscopy: n/a (age)  PAP: 11/2017, negative (per patient)  Bone density: n/a (age)   No Known Allergies  Current Outpatient Medications  Medication Sig Dispense Refill  . cyclobenzaprine (FLEXERIL) 10 MG tablet Take 1 tablet (10 mg total) by mouth 2 (two) times daily as needed for muscle spasms. (Patient not taking: Reported on 01/06/2020) 20 tablet 0  . ibuprofen (ADVIL,MOTRIN) 800 MG tablet Take 1 tablet (800 mg total) by mouth 3 (three) times daily. 21 tablet 0   No current facility-administered medications for this visit.    OBJECTIVE: [name] woman in no acute distress  Vitals:   01/29/20 1232  BP: 128/76  Pulse: 96  Resp: 20  Temp: 98.5 F (36.9 C)  SpO2: 100%     Body mass index is 36.56 kg/m.   Wt Readings from Last 3 Encounters:  01/29/20 196 lb 11.2 oz (89.2 kg)  01/23/20 197 lb (89.4 kg)  01/06/20 194 lb (88 kg)      ECOG FS:1 - Symptomatic but completely ambulatory  Ocular: Sclerae unicteric, pupils round and equal Ear-nose-throat: Wearing a mask Lymphatic: No cervical or supraclavicular adenopathy Lungs no rales or rhonchi Heart regular rate and rhythm Abd soft, nontender, positive bowel sounds MSK no focal spinal tenderness, no joint edema Neuro: non-focal, well-oriented, appropriate affect Breasts: The right breast is unremarkable.  The left breast shows a discrete mass in the upper outer left breast.  There is no overlying erythema or nipple retraction.  I do not palpate axillary adenopathy   LAB RESULTS:  CMP       Component Value Date/Time   NA 141 01/29/2020 1212   NA 138 08/15/2017 1525   K 4.6 01/29/2020 1212   CL 104 01/29/2020 1212   CO2 28 01/29/2020 1212   GLUCOSE 106 (H) 01/29/2020 1212   BUN 17 01/29/2020 1212   BUN 12 08/15/2017 1525   CREATININE 0.79 01/29/2020 1212   CALCIUM 10.2 01/29/2020 1212   PROT 7.6 01/29/2020 1212   PROT 7.4 01/03/2017 1642   ALBUMIN 3.8 01/29/2020 1212   ALBUMIN 4.3 01/03/2017 1642   AST 21 01/29/2020 1212   ALT 26 01/29/2020 1212   ALKPHOS 100 01/29/2020 1212   BILITOT 0.3 01/29/2020 1212   GFRNONAA >60 01/29/2020 1212   GFRAA >60 01/29/2020 1212    No results found for: TOTALPROTELP, ALBUMINELP, A1GS, A2GS, BETS, BETA2SER, GAMS, MSPIKE, SPEI  Lab Results  Component Value Date   WBC 6.4 01/29/2020   NEUTROABS 3.3 01/29/2020   HGB 13.2 01/29/2020   HCT 41.0 01/29/2020   MCV 85.8 01/29/2020   PLT 224 01/29/2020    No results found for: LABCA2  No components found for: OINOMV672  No results for input(s): INR in the last 168 hours.  No results found for: LABCA2  No results found for: CNO709  No results found for: GGE366  No results found for: QHU765  No results found for: YY5035  No components found for: HGQUANT  No results found for: CEA1 / No results found for: CEA1   No results found for: AFPTUMOR  No results found for: CHROMOGRNA  No results found for: KPAFRELGTCHN, LAMBDASER, KAPLAMBRATIO (kappa/lambda light chains)  No results found for: HGBA, HGBA2QUANT, HGBFQUANT, HGBSQUAN (Hemoglobinopathy evaluation)   No results found for: LDH  No results found for: IRON, TIBC, IRONPCTSAT (Iron and TIBC)  No results found for: FERRITIN  Urinalysis No results found for: COLORURINE, APPEARANCEUR, LABSPEC, PHURINE, GLUCOSEU, HGBUR, BILIRUBINUR, KETONESUR, PROTEINUR, UROBILINOGEN, NITRITE, LEUKOCYTESUR   STUDIES: DG Ankle Complete Left  Result Date: 01/06/2020 CLINICAL DATA:  Left ankle pain. EXAM: LEFT ANKLE COMPLETE -  3+ VIEW COMPARISON:  None. FINDINGS: The ankle mortise is maintained. No ankle fracture or osteochondral abnormality. No definite ankle joint effusion. The mid and hindfoot bony structures are intact. Moderate pes cavus. IMPRESSION: No acute bony findings or degenerative changes. Electronically Signed   By: Marijo Sanes M.D.   On: 01/06/2020 11:40   DG Ankle Complete Right  Result Date: 01/06/2020 CLINICAL DATA:  Right ankle pain. EXAM: RIGHT ANKLE - COMPLETE 3+ VIEW COMPARISON:  None. FINDINGS: The ankle mortise is maintained. No ankle fracture or osteochondral abnormality. No definite ankle joint effusion. The mid and hindfoot bony structures are intact. Moderate pes cavus. IMPRESSION: No acute bony findings. Electronically Signed   By: Marijo Sanes M.D.   On: 01/06/2020 11:41   US BREAST LTD UNI LEFT INC AXILLA  Result Date: 01/23/2020 CLINICAL DATA:  44 year old female presenting with a new lump in the left breast. EXAM: DIGITAL DIAGNOSTIC BILATERAL MAMMOGRAM WITH CAD AND TOMO ULTRASOUND LEFT BREAST COMPARISON:  None. ACR Breast Density Category b: There are scattered areas of fibroglandular density. FINDINGS: Mammogram: Right breast: No suspicious mass, distortion, or microcalcifications are identified to suggest presence of malignancy. Left breast: Spot compression tomosynthesis views were performed in addition to full field standard views. There is an irregular spiculated mass at the palpable site of concern in the upper-outer quadrant of the left breast. The mass spans approximately 3.5 cm. Associated with the mass are several pleomorphic microcalcifications. In the far upper outer quadrant of the left breast there is an oval circumscribed mass measuring 0.9 cm, likely an intramammary lymph node. There are several enlarged left axillary lymph nodes. Mammographic images were processed with CAD. On physical exam, I palpate a fixed discrete mass at the palpable site of concern in the upper outer left  breast. Ultrasound: Targeted ultrasound is performed in the left breast at 1 o'clock 5 cm from nipple demonstrating an irregular hypoechoic mass measuring 3.4 x 2.1 x 3.6 cm, which corresponds to the palpable abnormality. There is internal vascularity within the mass. Targeted ultrasound in the left breast at 2 o'clock 10 cm from the nipple demonstrates a round circumscribed hypoechoic mass with hilar flow measuring 0.8 x 0.8 x 0.8 cm, consistent with an intramammary lymph node. There is loss of the normal fatty hilum. Targeted ultrasound of the left axilla demonstrates at least two abnormal lymph nodes with cortical thickening measuring up to 1.7 cm. IMPRESSION: 1. Palpable left breast mass with associated calcifications overall measuring 3.6 cm is highly suspicious. 2. Left breast mass at 2 o'clock 10 cm from nipple is concerning for an abnormal intramammary lymph node. 3.  Left axillary adenopathy (at least 2 lymph nodes). RECOMMENDATION: Ultrasound-guided core needle biopsy x 3 for the above findings. This will be performed same day. I have discussed the findings and recommendations  with the patient. BI-RADS CATEGORY  5: Highly suggestive of malignancy. Electronically Signed   By: Audie Pinto M.D.   On: 01/23/2020 13:36   [place] AXILLARY NODE CORE BIOPSY LEFT  Addendum Date: 01/24/2020   ADDENDUM REPORT: 01/24/2020 13:48 ADDENDUM: Pathology revealed GRADE III INVASIVE DUCTAL CARCINOMA of the Left breast, 1 o'clock axis. Tumor infiltrating lymphocytes are noted. This was found to be concordant by Dr. Franki Cabot. Pathology revealed ONE LYMPH NODE, NEGATIVE FOR CARCINOMA of the Left breast, 2 o'clock axis. This was found to be discordant by Dr. Franki Cabot, with excision recommended. 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 This was found to be concordant by Dr. Franki Cabot. Pathology results were discussed with the patient by telephone by Kathrine Haddock,  Bilingual Patient Services Representative. The patient reported doing well after the biopsies with tenderness at the sites. Post biopsy instructions and care were reviewed and questions were answered. The patient was encouraged to call [facility] for any additional concerns. The patient was referred to [facility] on Jan 29, 2020. Recommendation for a bilateral breast MRI for further evaluation of extent of disease and age. Pathology results reported by Terie Purser, RN on 01/24/2020. Electronically Signed   By: Franki Cabot M.D.   On: 01/24/2020 13:48   Result Date: 01/24/2020 CLINICAL DATA:  Patient presents for ultrasound-guided biopsy of a highly suspicious mass in the LEFT breast at the 1 o'clock axis. Patient also presents for ultrasound-guided biopsy of a suspicious mass/lymph node in the LEFT breast at the 2 o'clock axis. Finally, patient presents for ultrasound-guided biopsy of an enlarged/morphologically abnormal lymph node in the LEFT axilla. EXAM: ULTRASOUND GUIDED LEFT BREAST CORE NEEDLE BIOPSY ULTRASOUND-GUIDED LEFT BREAST CORE NEEDLE BIOPSY ULTRASOUND-GUIDED LEFT AXILLA CORE NEEDLE BIOPSY COMPARISON:  Previous exam(s). PROCEDURE: I met with the patient and we discussed the procedure of ultrasound-guided biopsy, including benefits and alternatives. We discussed the high likelihood of a successful procedure. We discussed the risks of the procedure, including infection, bleeding, tissue injury, clip migration, and inadequate sampling. Informed written consent was given. The usual time-out protocol was performed immediately prior to the procedure. Site 1: Lesion quadrant: Upper outer quadrant Using sterile technique and 1% Lidocaine as local anesthetic, under direct ultrasound visualization, a 12 gauge spring-loaded device was used to perform biopsy of the LEFT breast mass at the 1 o'clock axis using  a lateral approach. At the conclusion of the procedure ribbon shaped tissue marker clip was deployed into the biopsy cavity. Site 2: Lesion quadrant: Upper outer quadrant Using sterile technique and 1% Lidocaine as local anesthetic, under direct ultrasound visualization, a 14 gauge spring-loaded device was used to perform biopsy of the LEFT breast mass/lymph node at the 2 o'clock axis using a lateral approach. At the conclusion of the procedure coil shaped tissue marker clip was deployed into the biopsy cavity. Site 3: Lesion quadrant: Axilla Using sterile technique and 1% Lidocaine as local anesthetic, under direct ultrasound visualization, a 14 gauge spring-loaded device was used to perform biopsy of the enlarged lymph node in the LEFT axilla using a lateral approach. At the conclusion of the procedure Q shaped tissue marker clip was deployed into the biopsy cavity. Follow up 2 view mammogram was performed and dictated separately. IMPRESSION: 1. Ultrasound guided biopsy of the highly suspicious mass in the LEFT breast at the 1 o'clock axis. No  apparent complications. 2. Ultrasound guided biopsy of the suspicious mass/lymph node in the LEFT breast at the 2 o'clock axis. No apparent complications. 3. Ultrasound guided biopsy of the enlarged lymph node in the LEFT axilla. No apparent complications. Electronically Signed: By: Franki Cabot M.D. On: 01/23/2020 15:38   MS DIGITAL DIAG TOMO BILAT  Result Date: 01/23/2020 CLINICAL DATA:  44 year old female presenting with a new lump in the left breast. EXAM: DIGITAL DIAGNOSTIC BILATERAL MAMMOGRAM WITH CAD AND TOMO ULTRASOUND LEFT BREAST COMPARISON:  None. ACR Breast Density Category b: There are scattered areas of fibroglandular density. FINDINGS: Mammogram: Right breast: No suspicious mass, distortion, or microcalcifications are identified to suggest presence of malignancy. Left breast: Spot compression tomosynthesis views were performed in addition to full field  standard views. There is an irregular spiculated mass at the palpable site of concern in the upper-outer quadrant of the left breast. The mass spans approximately 3.5 cm. Associated with the mass are several pleomorphic microcalcifications. In the far upper outer quadrant of the left breast there is an oval circumscribed mass measuring 0.9 cm, likely an intramammary lymph node. There are several enlarged left axillary lymph nodes. Mammographic images were processed with CAD. On physical exam, I palpate a fixed discrete mass at the palpable site of concern in the upper outer left breast. Ultrasound: Targeted ultrasound is performed in the left breast at 1 o'clock 5 cm from nipple demonstrating an irregular hypoechoic mass measuring 3.4 x 2.1 x 3.6 cm, which corresponds to the palpable abnormality. There is internal vascularity within the mass. Targeted ultrasound in the left breast at 2 o'clock 10 cm from the nipple demonstrates a round circumscribed hypoechoic mass with hilar flow measuring 0.8 x 0.8 x 0.8 cm, consistent with an intramammary lymph node. There is loss of the normal fatty hilum. Targeted ultrasound of the left axilla demonstrates at least two abnormal lymph nodes with cortical thickening measuring up to 1.7 cm. IMPRESSION: 1. Palpable left breast mass with associated calcifications overall measuring 3.6 cm is highly suspicious. 2. Left breast mass at 2 o'clock 10 cm from nipple is concerning for an abnormal intramammary lymph node. 3.  Left axillary adenopathy (at least 2 lymph nodes). RECOMMENDATION: Ultrasound-guided core needle biopsy x 3 for the above findings. This will be performed same day. I have discussed the findings and recommendations with the patient. BI-RADS CATEGORY  5: Highly suggestive of malignancy. Electronically Signed   By: Audie Pinto M.D.   On: 01/23/2020 13:36   MM CLIP PLACEMENT LEFT  Result Date: 01/23/2020 CLINICAL DATA:  Status post 3 ultrasound-guided biopsies  today. EXAM: DIAGNOSTIC LEFT MAMMOGRAM POST ULTRASOUND BIOPSY x3 COMPARISON:  Previous exam(s). FINDINGS: Mammographic images were obtained following ultrasound guided biopsies of masses in the LEFT breast at the 1 o'clock axis and 2 o'clock axis followed by ultrasound-guided biopsy of an enlarged lymph node in the LEFT axilla. All 3 biopsy marking clips are in expected position at the sites of biopsy. IMPRESSION: 1. Appropriate positioning of the ribbon shaped biopsy marking clip at the site of biopsy in the upper-outer quadrant of the LEFT breast corresponding to the mass at the 1 o'clock axis. 2. Appropriate positioning of the coil shaped biopsy marking clip at the site of biopsy in the upper-outer quadrant of the LEFT breast corresponding to the mass at the 2 o'clock axis. 3. Appropriate positioning of the Q shaped biopsy marking clip at the site of biopsy in the LEFT axilla. Final Assessment: Post Procedure Mammograms for  Marker Placement Electronically Signed   By: Franki Cabot M.D.   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 Tumor infiltrating lymphocytes are noted. This was found to be concordant by Dr. Franki Cabot. Pathology revealed ONE LYMPH NODE, NEGATIVE FOR CARCINOMA of the Left breast, 2 o'clock axis. This was found to be discordant by Dr. Franki Cabot, with excision recommended. 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 This was found to be concordant by Dr. Franki Cabot. Pathology results were discussed with the patient by telephone by Kathrine Haddock, Bilingual Patient Services Representative. The patient reported doing well after the biopsies with tenderness at the sites. Post biopsy instructions and care were reviewed and questions were answered.  The patient was encouraged to call [facility] for any additional concerns. The patient was referred to [facility] on Jan 29, 2020. Recommendation for a bilateral breast MRI for further evaluation of extent of disease and age. Pathology results reported by Terie Purser, RN on 01/24/2020. Electronically Signed   By: Franki Cabot M.D.   On: 01/24/2020 13:48   Result Date: 01/24/2020 CLINICAL DATA:  Patient presents for ultrasound-guided biopsy of a highly suspicious mass in the LEFT breast at the 1 o'clock axis. Patient also presents for ultrasound-guided biopsy of a suspicious mass/lymph node in the LEFT breast at the 2 o'clock axis. Finally, patient presents for ultrasound-guided biopsy of an enlarged/morphologically abnormal lymph node in the LEFT axilla. EXAM: ULTRASOUND GUIDED LEFT BREAST CORE NEEDLE BIOPSY ULTRASOUND-GUIDED LEFT BREAST CORE NEEDLE BIOPSY ULTRASOUND-GUIDED LEFT AXILLA CORE NEEDLE BIOPSY COMPARISON:  Previous exam(s). PROCEDURE: I met with the patient and we discussed the procedure of ultrasound-guided biopsy, including benefits and alternatives. We discussed the high likelihood of a successful procedure. We discussed the risks of the procedure, including infection, bleeding, tissue injury, clip migration, and inadequate sampling. Informed written consent was given. The usual time-out protocol was performed immediately prior to the procedure. Site 1: Lesion quadrant: Upper outer quadrant Using sterile technique and 1% Lidocaine as local anesthetic, under direct ultrasound visualization, a 12 gauge spring-loaded device was used to perform biopsy of the LEFT breast mass at the 1 o'clock axis using a lateral approach. At the conclusion of the procedure ribbon shaped tissue marker clip was deployed into the biopsy cavity. Site 2: Lesion quadrant: Upper outer quadrant Using sterile technique and  1% Lidocaine as local anesthetic, under direct ultrasound visualization, a 14 gauge spring-loaded device was used to perform biopsy of the LEFT breast mass/lymph node at the 2 o'clock axis using a lateral approach. At the conclusion of the procedure coil shaped tissue marker clip was deployed into the biopsy cavity. Site 3: Lesion quadrant: Axilla Using sterile technique and 1% Lidocaine as local anesthetic, under direct ultrasound visualization, a 14 gauge spring-loaded device was used to perform biopsy of the enlarged lymph node in the LEFT axilla using a lateral approach. At the conclusion of the procedure Q shaped tissue marker clip was deployed into the biopsy cavity. Follow up 2 view mammogram was performed and dictated separately. IMPRESSION: 1. Ultrasound guided biopsy of the highly suspicious mass in the LEFT breast at the 1 o'clock axis. No apparent  complications. 2. Ultrasound guided biopsy of the suspicious mass/lymph node in the LEFT breast at the 2 o'clock axis. No apparent complications. 3. Ultrasound guided biopsy of the enlarged lymph node in the LEFT axilla. No apparent complications. Electronically Signed: By: Franki Cabot M.D. 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 Tumor infiltrating lymphocytes are noted. This was found to be concordant by Dr. Franki Cabot. Pathology revealed ONE LYMPH NODE, NEGATIVE FOR CARCINOMA of the Left breast, 2 o'clock axis. This was found to be discordant by Dr. Franki Cabot, with excision recommended. 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 This was found to be concordant by Dr. Franki Cabot. Pathology results were discussed with the patient by telephone by Kathrine Haddock, Bilingual Patient Services  Representative. The patient reported doing well after the biopsies with tenderness at the sites. Post biopsy instructions and care were reviewed and questions were answered. The patient was encouraged to call [facility] for any additional concerns. The patient was referred to [facility] on Jan 29, 2020. Recommendation for a bilateral breast MRI for further evaluation of extent of disease and age. Pathology results reported by Terie Purser, RN on 01/24/2020. Electronically Signed   By: Franki Cabot M.D.   On: 01/24/2020 13:48   Result Date: 01/24/2020 CLINICAL DATA:  Patient presents for ultrasound-guided biopsy of a highly suspicious mass in the LEFT breast at the 1 o'clock axis. Patient also presents for ultrasound-guided biopsy of a suspicious mass/lymph node in the LEFT breast at the 2 o'clock axis. Finally, patient presents for ultrasound-guided biopsy of an enlarged/morphologically abnormal lymph node in the LEFT axilla. EXAM: ULTRASOUND GUIDED LEFT BREAST CORE NEEDLE BIOPSY ULTRASOUND-GUIDED LEFT BREAST CORE NEEDLE BIOPSY ULTRASOUND-GUIDED LEFT AXILLA CORE NEEDLE BIOPSY COMPARISON:  Previous exam(s). PROCEDURE: I met with the patient and we discussed the procedure of ultrasound-guided biopsy, including benefits and alternatives. We discussed the high likelihood of a successful procedure. We discussed the risks of the procedure, including infection, bleeding, tissue injury, clip migration, and inadequate sampling. Informed written consent was given. The usual time-out protocol was performed immediately prior to the procedure. Site 1: Lesion quadrant: Upper outer quadrant Using sterile technique and 1% Lidocaine as local anesthetic, under direct ultrasound visualization, a 12 gauge spring-loaded device was used to perform biopsy of the LEFT breast mass at the 1 o'clock axis using a lateral approach. At the  conclusion of the procedure ribbon shaped tissue marker clip was deployed into the biopsy cavity. Site 2: Lesion quadrant: Upper outer quadrant Using sterile technique and 1% Lidocaine as local anesthetic, under direct ultrasound visualization, a 14 gauge spring-loaded device was used to perform biopsy of the LEFT breast mass/lymph node at the 2 o'clock axis using a lateral approach. At the conclusion of the procedure coil shaped tissue marker clip was deployed into the biopsy cavity. Site 3: Lesion quadrant: Axilla Using sterile technique and 1% Lidocaine as local anesthetic, under direct ultrasound visualization, a 14 gauge spring-loaded device was used to perform biopsy of the enlarged lymph node in the LEFT axilla using a lateral approach. At the conclusion of the procedure Q shaped tissue marker clip was deployed into the  biopsy cavity. Follow up 2 view mammogram was performed and dictated separately. IMPRESSION: 1. Ultrasound guided biopsy of the highly suspicious mass in the LEFT breast at the 1 o'clock axis. No apparent complications. 2. Ultrasound guided biopsy of the suspicious mass/lymph node in the LEFT breast at the 2 o'clock axis. No apparent complications. 3. Ultrasound guided biopsy of the enlarged lymph node in the LEFT axilla. No apparent complications. Electronically Signed: By: Franki Cabot M.D. On: 01/23/2020 15:38     ELIGIBLE FOR AVAILABLE RESEARCH PROTOCOL: [id number]?  ASSESSMENT: 44 y.o. 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  (1) neoadjuvant chemotherapy will consist of doxorubicin and cyclophosphamide in dose dense fashion x4 followed by paclitaxel and carboplatin weekly x12  (2) definitive surgery to follow  (3) adjuvant radiation  (4) genetics testing  PLAN: I met today with Kateria to review her new diagnosis.  Specifically we discussed the biology of her breast cancer, its diagnosis, staging, treatment  options and prognosis. We first reviewed the fact that cancer is not one disease but more than 100 different diseases and that it is important to keep them separate-- otherwise when friends and relatives discuss their own cancer experiences with Christian confusion can result. Similarly we explained that if breast cancer spreads to the bone or liver, the patient would not have bone cancer or liver cancer, but breast cancer in the bone and breast cancer in the liver: one cancer in three places-- not 3 different cancers which otherwise would have to be treated in 3 different ways.  We discussed the difference between local and systemic therapy. In terms of loco-regional treatment, lumpectomy plus radiation is equivalent to mastectomy as far as survival is concerned. For this reason, and because the cosmetic results are generally superior, we recommend breast conserving surgery.   We also noted that in terms of sequencing of treatments, whether systemic therapy or surgery is done first does not affect the ultimate outcome.  This is relevant to [facility] situation since we recommend chemotherapy first to make the definitive surgery easier and increase the chances of her keeping her breast.  We then discussed the rationale for systemic therapy. There is some risk that this cancer may have already spread to other parts of her body.  In stage III cases we proceed to scans and these are being scheduled but I am hopeful they will be negative since she gives me no symptoms of concern except for a mid upper back pain which has been present "for a long time".  Next we went over the options for systemic therapy which are anti-estrogens, anti-HER-2 immunotherapy, and chemotherapy. Matison does not meet criteria for anti-HER-2 immunotherapy and she is unlikely to derive any significant benefit from antiestrogens.  Her tumor is  functionally triple negative.  Accordingly chemotherapy is her only systemic option and this is what we recommended.  We discussed standard of care with doxorubicin and cyclophosphamide in dose dense fashion x4 followed by weekly paclitaxel and carboplatin x12.  We reviewed the possible toxicities side effects and complications of these agents.  She will have staging studies, echocardiography, port placement, and she will meet with our chemotherapy teaching nurse.  Target start of treatment is February 12, 2020.  She will see me before that to make sure breathing is in place.  Jaidee has a good understanding of the overall plan.  She agrees with it. She knows the goal of treatment in her case is cure. She will call with any problems that may develop before her next visit here.  Total encounter time 70 minutes.Sarajane Jews C. Apollonia Amini, MD 01/29/2020 5:38 PM Medical Oncology and Hematology [facility] [place], [place] [address] Tel. [phone]    Fax. [phone]   This document serves as a record of services personally performed by Lurline Del, MD. It was created on his behalf by Wilburn Mylar, a trained medical scribe. The creation of this record is based on the scribe's personal observations and the provider's statements to them.   I, Lurline Del MD, have reviewed the above documentation for accuracy and completeness, and I agree with the above.    *Total Encounter Time as defined by the Centers for Medicare and Medicaid Services includes, in addition to the face-to-face time of a patient visit (documented in the note above) non-face-to-face time: obtaining and reviewing outside history, ordering and reviewing medications, tests or procedures, care coordination (communications with other health care professionals or caregivers) and documentation in the medical record.

## 2020-01-29 ENCOUNTER — Encounter: Payer: Self-pay | Admitting: Oncology

## 2020-01-29 ENCOUNTER — Encounter: Payer: Self-pay | Admitting: Physical Therapy

## 2020-01-29 ENCOUNTER — Other Ambulatory Visit: Payer: Self-pay | Admitting: *Deleted

## 2020-01-29 ENCOUNTER — Other Ambulatory Visit: Payer: Self-pay

## 2020-01-29 ENCOUNTER — Other Ambulatory Visit: Payer: Self-pay | Admitting: General Surgery

## 2020-01-29 ENCOUNTER — Ambulatory Visit: Payer: No Typology Code available for payment source | Admitting: Licensed Clinical Social Worker

## 2020-01-29 ENCOUNTER — Encounter: Payer: Self-pay | Admitting: *Deleted

## 2020-01-29 ENCOUNTER — Inpatient Hospital Stay: Payer: No Typology Code available for payment source

## 2020-01-29 ENCOUNTER — Encounter: Payer: Self-pay | Admitting: Radiation Oncology

## 2020-01-29 ENCOUNTER — Inpatient Hospital Stay: Payer: Self-pay | Attending: Oncology | Admitting: Oncology

## 2020-01-29 ENCOUNTER — Ambulatory Visit: Payer: No Typology Code available for payment source | Attending: General Surgery | Admitting: Physical Therapy

## 2020-01-29 ENCOUNTER — Ambulatory Visit
Admission: RE | Admit: 2020-01-29 | Discharge: 2020-01-29 | Disposition: A | Discharge status: Home / Self Care | Payer: Self-pay | Source: Ambulatory Visit | Attending: Radiation Oncology | Admitting: Radiation Oncology

## 2020-01-29 VITALS — BP 128/76 | HR 96 | Temp 98.5°F | Resp 20 | Ht 61.5 in | Wt 196.7 lb

## 2020-01-29 DIAGNOSIS — M549 Dorsalgia, unspecified: Secondary | ICD-10-CM | POA: Insufficient documentation

## 2020-01-29 DIAGNOSIS — Z170 Estrogen receptor positive status [ER+]: Secondary | ICD-10-CM

## 2020-01-29 DIAGNOSIS — J45909 Unspecified asthma, uncomplicated: Secondary | ICD-10-CM | POA: Insufficient documentation

## 2020-01-29 DIAGNOSIS — C50412 Malignant neoplasm of upper-outer quadrant of left female breast: Secondary | ICD-10-CM | POA: Insufficient documentation

## 2020-01-29 DIAGNOSIS — Z603 Acculturation difficulty: Secondary | ICD-10-CM

## 2020-01-29 DIAGNOSIS — C773 Secondary and unspecified malignant neoplasm of axilla and upper limb lymph nodes: Secondary | ICD-10-CM | POA: Insufficient documentation

## 2020-01-29 DIAGNOSIS — G43909 Migraine, unspecified, not intractable, without status migrainosus: Secondary | ICD-10-CM | POA: Insufficient documentation

## 2020-01-29 DIAGNOSIS — Z171 Estrogen receptor negative status [ER-]: Secondary | ICD-10-CM | POA: Insufficient documentation

## 2020-01-29 DIAGNOSIS — R293 Abnormal posture: Secondary | ICD-10-CM | POA: Insufficient documentation

## 2020-01-29 LAB — CBC WITH DIFFERENTIAL (CANCER CENTER ONLY)
Abs Immature Granulocytes: 0.04 10*3/uL (ref 0.00–0.07)
Basophils Absolute: 0 10*3/uL (ref 0.0–0.1)
Basophils Relative: 1 %
Eosinophils Absolute: 0.2 10*3/uL (ref 0.0–0.5)
Eosinophils Relative: 3 %
HCT: 41 % (ref 36.0–46.0)
Hemoglobin: 13.2 g/dL (ref 12.0–15.0)
Immature Granulocytes: 1 %
Lymphocytes Relative: 38 %
Lymphs Abs: 2.5 10*3/uL (ref 0.7–4.0)
MCH: 27.6 pg (ref 26.0–34.0)
MCHC: 32.2 g/dL (ref 30.0–36.0)
MCV: 85.8 fL (ref 80.0–100.0)
Monocytes Absolute: 0.4 10*3/uL (ref 0.1–1.0)
Monocytes Relative: 7 %
Neutro Abs: 3.3 10*3/uL (ref 1.7–7.7)
Neutrophils Relative %: 50 %
Platelet Count: 224 10*3/uL (ref 150–400)
RBC: 4.78 MIL/uL (ref 3.87–5.11)
RDW: 13.5 % (ref 11.5–15.5)
WBC Count: 6.4 10*3/uL (ref 4.0–10.5)
nRBC: 0 % (ref 0.0–0.2)

## 2020-01-29 LAB — CMP (CANCER CENTER ONLY)
ALT: 26 U/L (ref 0–44)
AST: 21 U/L (ref 15–41)
Albumin: 3.8 g/dL (ref 3.5–5.0)
Alkaline Phosphatase: 100 U/L (ref 38–126)
Anion gap: 9 (ref 5–15)
BUN: 17 mg/dL (ref 6–20)
CO2: 28 mmol/L (ref 22–32)
Calcium: 10.2 mg/dL (ref 8.9–10.3)
Chloride: 104 mmol/L (ref 98–111)
Creatinine: 0.79 mg/dL (ref 0.44–1.00)
GFR, Est AFR Am: >60 mL/min
GFR, Estimated: >60 mL/min
Glucose, Bld: 106 mg/dL — ABNORMAL HIGH (ref 70–99)
Potassium: 4.6 mmol/L (ref 3.5–5.1)
Sodium: 141 mmol/L (ref 135–145)
Total Bilirubin: 0.3 mg/dL (ref 0.3–1.2)
Total Protein: 7.6 g/dL (ref 6.5–8.1)

## 2020-01-29 LAB — GENETIC SCREENING ORDER

## 2020-01-29 MED ORDER — LORAZEPAM 0.5 MG PO TABS: ORAL_TABLET | ORAL | 0 refills | Status: DC

## 2020-01-29 MED ORDER — LIDOCAINE-PRILOCAINE 2.5-2.5 % EX CREA: TOPICAL_CREAM | CUTANEOUS | 3 refills | Status: DC

## 2020-01-29 MED ORDER — LORATADINE 10 MG PO TABS
10.0000 mg | ORAL_TABLET | Freq: Every day | ORAL | 0 refills | Status: DC
Start: 2020-01-29 — End: 2020-04-08

## 2020-01-29 MED ORDER — PROCHLORPERAZINE MALEATE 10 MG PO TABS: 10.0000 mg | ORAL_TABLET | Freq: Three times a day (TID) | ORAL | 1 refills | Status: DC

## 2020-01-29 MED ORDER — DEXAMETHASONE 4 MG PO TABS: ORAL_TABLET | ORAL | 1 refills | Status: DC

## 2020-01-29 NOTE — Progress Notes (Signed)
START ON PATHWAY REGIMEN - Breast     A cycle is every 14 days (cycles 1-4):     Doxorubicin      Cyclophosphamide      Pegfilgrastim-xxxx    A cycle is every 21 days (cycles 5-8):     Paclitaxel      Carboplatin   **Always confirm dose/schedule in your pharmacy ordering system**  Patient Characteristics: Preoperative or Nonsurgical Candidate (Clinical Staging), Neoadjuvant Therapy followed by Surgery, Invasive Disease, Chemotherapy, HER2 Negative/Unknown/Equivocal, ER Negative/Unknown, Platinum Therapy Indicated Therapeutic Status: Preoperative or Nonsurgical Candidate (Clinical Staging) AJCC M Category: cM0 AJCC Grade: G3 Breast Surgical Plan: Neoadjuvant Therapy followed by Surgery ER Status: Negative (-) AJCC 8 Stage Grouping: IIIB HER2 Status: Negative (-) AJCC T Category: cT2 AJCC N Category: cN1 PR Status: Negative (-) Type of Therapy: Platinum Therapy Indicated Intent of Therapy: Curative Intent, Discussed with Patient 

## 2020-01-29 NOTE — Progress Notes (Signed)
Radiation Oncology         [phone] ________________________________  Initial outpatient Consultation  Name: [name] MRN: [id number]  Date: 01/29/2020  DOB: [birth year]  VQ:[id number], Lilia Argue, MD  Rolm Bookbinder, MD   REFERRING PHYSICIAN: Rolm Bookbinder, MD  DIAGNOSIS:    ICD-10-CM   1. Malignant neoplasm of upper-outer quadrant of left breast in female, estrogen receptor positive ([place])  C50.412    Z17.0    Cancer Staging Malignant neoplasm of upper-outer quadrant of left breast in female, estrogen receptor positive ([place]) Staging form: Breast, AJCC 8th Edition - Clinical stage from 01/29/2020: Stage IIIA (cT2, cN1, cM0, G3, ER+, PR-, HER2-) - Unsigned     CHIEF COMPLAINT: Here to discuss management of left breast cancer  HISTORY OF PRESENT ILLNESS::[name] is a 44 y.o. female who presented with palpable left breast lump.   Ultrasound of breast on 01/23/2020 revealed: palpable left breast mass with associated calcifications overall measuring 3.6 cm; left breast mass at 2 o'clock concerning for abnormal intramammary lymph node; at least 2 abnormal left axillary lymph nodes.   Biopsy performed that day showed invasive ductal at dominant mass.  ER status: 20%; PR status 0%, Her2 status negative; Grade 3. The node at 2 o'clock was benign (discordant), but the left axillary node was positive for metastatic carcinoma.  She has been discussed at multidisciplinary tumor board.  The plan is that she will undergo neoadjuvant chemotherapy, followed by breast conserving surgery.  MRI of breasts and staging scans are pending.  She is here with her significant other today.  They have not yet been vaccinated for Covid.  They have questions about the vaccine.  A translator is present for the encounter.  She reports some postbiopsy bruising and tenderness in her left breast.  PREVIOUS RADIATION THERAPY: No  PAST MEDICAL HISTORY:  has a past medical history of Allergy,  Asthma, Chronic back pain, migraines, and Medical history non-contributory.    PAST SURGICAL HISTORY: Past Surgical History:  Procedure Laterality Date  . CESAREAN SECTION WITH BILATERAL TUBAL LIGATION Bilateral 07/23/2015   Procedure: CESAREAN SECTION WITH BILATERAL TUBAL LIGATION;  Surgeon: Donnamae Jude, MD;  Location: [facility];  Service: Obstetrics;  Laterality: Bilateral;  . TUBAL LIGATION      FAMILY HISTORY: family history is not on file.  SOCIAL HISTORY:  reports that she has never smoked. She has never used smokeless tobacco. She reports that she does not drink alcohol or use drugs.  ALLERGIES: Patient has no known allergies.  MEDICATIONS:  Current Outpatient Medications  Medication Sig Dispense Refill  . cyclobenzaprine (FLEXERIL) 10 MG tablet Take 1 tablet (10 mg total) by mouth 2 (two) times daily as needed for muscle spasms. (Patient not taking: Reported on 01/06/2020) 20 tablet 0  . dexamethasone (DECADRON) 4 MG tablet Take 2 tablets (8 mg) by mouth twice daily with food starting the day after chemotherapy (day 2), continue day3, take the last dose the morning of day 4 30 tablet 1  . ibuprofen (ADVIL,MOTRIN) 800 MG tablet Take 1 tablet (800 mg total) by mouth 3 (three) times daily. 21 tablet 0  . lidocaine-prilocaine (EMLA) cream Apply to affected area once 30 g 3  . loratadine (CLARITIN) 10 MG tablet Take 1 tablet (10 mg total) by mouth daily. 60 tablet 0  . LORazepam (ATIVAN) 0.5 MG tablet Take 1 tablet at bedtime on days 1 ,2 and 3 of each chemotherapy treatment, then at bedtime as needed 30 tablet  0  . prochlorperazine (COMPAZINE) 10 MG tablet Take 1 tablet (10 mg total) by mouth 3 (three) times daily before meals for 3 days. 30 tablet 1   No current facility-administered medications for this encounter.    REVIEW OF SYSTEMS: As above  PHYSICAL EXAM:  vitals were not taken for this visit.   General: Alert and oriented, in no acute distress Psychiatric: Judgment and  insight are intact. Affect is appropriate. Breasts: Left breast is notable for postbiopsy bruising in the upper outer quadrant.  The majority of the upper outer quadrant is firm, consistent with a breast mass and surrounding postbiopsy changes.. No other palpable masses appreciated in the breasts or axillae   bilaterally.    ECOG = 0  0 - Asymptomatic (Fully active, able to carry on all predisease activities without restriction)  1 - Symptomatic but completely ambulatory (Restricted in physically strenuous activity but ambulatory and able to carry out work of a light or sedentary nature. For example, light housework, office work)  2 - Symptomatic, <50% in bed during the day (Ambulatory and capable of all self care but unable to carry out any work activities. Up and about more than 50% of waking hours)  3 - Symptomatic, >50% in bed, but not bedbound (Capable of only limited self-care, confined to bed or chair 50% or more of waking hours)  4 - Bedbound (Completely disabled. Cannot carry on any self-care. Totally confined to bed or chair)  5 - Death   Eustace Pen MM, Creech RH, Tormey DC, et al. [phone]). "Toxicity and response criteria of the [facility]". [place] Oncol. 5 (6): 649-55   LABORATORY DATA:  Lab Results  Component Value Date   WBC 6.4 01/29/2020   HGB 13.2 01/29/2020   HCT 41.0 01/29/2020   MCV 85.8 01/29/2020   PLT 224 01/29/2020   CMP     Component Value Date/Time   NA 141 01/29/2020 1212   NA 138 08/15/2017 1525   K 4.6 01/29/2020 1212   CL 104 01/29/2020 1212   CO2 28 01/29/2020 1212   GLUCOSE 106 (H) 01/29/2020 1212   BUN 17 01/29/2020 1212   BUN 12 08/15/2017 1525   CREATININE 0.79 01/29/2020 1212   CALCIUM 10.2 01/29/2020 1212   PROT 7.6 01/29/2020 1212   PROT 7.4 01/03/2017 1642   ALBUMIN 3.8 01/29/2020 1212   ALBUMIN 4.3 01/03/2017 1642   AST 21 01/29/2020 1212   ALT 26 01/29/2020 1212   ALKPHOS 100 01/29/2020 1212   BILITOT  0.3 01/29/2020 1212   GFRNONAA >60 01/29/2020 1212   GFRAA >60 01/29/2020 1212         RADIOGRAPHY: DG Ankle Complete Left  Result Date: 01/06/2020 CLINICAL DATA:  Left ankle pain. EXAM: LEFT ANKLE COMPLETE - 3+ VIEW COMPARISON:  None. FINDINGS: The ankle mortise is maintained. No ankle fracture or osteochondral abnormality. No definite ankle joint effusion. The mid and hindfoot bony structures are intact. Moderate pes cavus. IMPRESSION: No acute bony findings or degenerative changes. Electronically Signed   By: Marijo Sanes M.D.   On: 01/06/2020 11:40   DG Ankle Complete Right  Result Date: 01/06/2020 CLINICAL DATA:  Right ankle pain. EXAM: RIGHT ANKLE - COMPLETE 3+ VIEW COMPARISON:  None. FINDINGS: The ankle mortise is maintained. No ankle fracture or osteochondral abnormality. No definite ankle joint effusion. The mid and hindfoot bony structures are intact. Moderate pes cavus. IMPRESSION: No acute bony findings. Electronically Signed   By: Mamie Nick.  Gallerani M.D.   On: 01/06/2020 11:41   US BREAST LTD UNI LEFT INC AXILLA  Result Date: 01/23/2020 CLINICAL DATA:  44 year old female presenting with a new lump in the left breast. EXAM: DIGITAL DIAGNOSTIC BILATERAL MAMMOGRAM WITH CAD AND TOMO ULTRASOUND LEFT BREAST COMPARISON:  None. ACR Breast Density Category b: There are scattered areas of fibroglandular density. FINDINGS: Mammogram: Right breast: No suspicious mass, distortion, or microcalcifications are identified to suggest presence of malignancy. Left breast: Spot compression tomosynthesis views were performed in addition to full field standard views. There is an irregular spiculated mass at the palpable site of concern in the upper-outer quadrant of the left breast. The mass spans approximately 3.5 cm. Associated with the mass are several pleomorphic microcalcifications. In the far upper outer quadrant of the left breast there is an oval circumscribed mass measuring 0.9 cm, likely an intramammary  lymph node. There are several enlarged left axillary lymph nodes. Mammographic images were processed with CAD. On physical exam, I palpate a fixed discrete mass at the palpable site of concern in the upper outer left breast. Ultrasound: Targeted ultrasound is performed in the left breast at 1 o'clock 5 cm from nipple demonstrating an irregular hypoechoic mass measuring 3.4 x 2.1 x 3.6 cm, which corresponds to the palpable abnormality. There is internal vascularity within the mass. Targeted ultrasound in the left breast at 2 o'clock 10 cm from the nipple demonstrates a round circumscribed hypoechoic mass with hilar flow measuring 0.8 x 0.8 x 0.8 cm, consistent with an intramammary lymph node. There is loss of the normal fatty hilum. Targeted ultrasound of the left axilla demonstrates at least two abnormal lymph nodes with cortical thickening measuring up to 1.7 cm. IMPRESSION: 1. Palpable left breast mass with associated calcifications overall measuring 3.6 cm is highly suspicious. 2. Left breast mass at 2 o'clock 10 cm from nipple is concerning for an abnormal intramammary lymph node. 3.  Left axillary adenopathy (at least 2 lymph nodes). RECOMMENDATION: Ultrasound-guided core needle biopsy x 3 for the above findings. This will be performed same day. I have discussed the findings and recommendations with the patient. BI-RADS CATEGORY  5: Highly suggestive of malignancy. Electronically Signed   By: Audie Pinto M.D.   On: 01/23/2020 13:36   [place] AXILLARY NODE CORE BIOPSY LEFT  Addendum Date: 01/24/2020   ADDENDUM REPORT: 01/24/2020 13:48 ADDENDUM: Pathology revealed GRADE III INVASIVE DUCTAL CARCINOMA of the Left breast, 1 o'clock axis. Tumor infiltrating lymphocytes are noted. This was found to be concordant by Dr. Franki Cabot. Pathology revealed ONE LYMPH NODE, NEGATIVE FOR CARCINOMA of the Left breast, 2 o'clock axis. This was found to be discordant by Dr. Franki Cabot, with excision recommended. Pathology  revealed METASTATIC CARCINOMA IN ONE OF ONE LYMPH NODE of the Left axilla. This was found to be concordant by Dr. Franki Cabot. Pathology results were discussed with the patient by telephone by Kathrine Haddock, Bilingual Patient Services Representative. The patient reported doing well after the biopsies with tenderness at the sites. Post biopsy instructions and care were reviewed and questions were answered. The patient was encouraged to call [facility] for any additional concerns. The patient was referred to [facility] on Jan 29, 2020. Recommendation for a bilateral breast MRI for further evaluation of extent of disease and age. Pathology results reported by Terie Purser, RN on 01/24/2020. Electronically Signed   By: Roxy Horseman.D.  On: 01/24/2020 13:48   Result Date: 01/24/2020 CLINICAL DATA:  Patient presents for ultrasound-guided biopsy of a highly suspicious mass in the LEFT breast at the 1 o'clock axis. Patient also presents for ultrasound-guided biopsy of a suspicious mass/lymph node in the LEFT breast at the 2 o'clock axis. Finally, patient presents for ultrasound-guided biopsy of an enlarged/morphologically abnormal lymph node in the LEFT axilla. EXAM: ULTRASOUND GUIDED LEFT BREAST CORE NEEDLE BIOPSY ULTRASOUND-GUIDED LEFT BREAST CORE NEEDLE BIOPSY ULTRASOUND-GUIDED LEFT AXILLA CORE NEEDLE BIOPSY COMPARISON:  Previous exam(s). PROCEDURE: I met with the patient and we discussed the procedure of ultrasound-guided biopsy, including benefits and alternatives. We discussed the high likelihood of a successful procedure. We discussed the risks of the procedure, including infection, bleeding, tissue injury, clip migration, and inadequate sampling. Informed written consent was given. The usual time-out protocol was performed immediately prior to the procedure. Site 1: Lesion quadrant: Upper outer  quadrant Using sterile technique and 1% Lidocaine as local anesthetic, under direct ultrasound visualization, a 12 gauge spring-loaded device was used to perform biopsy of the LEFT breast mass at the 1 o'clock axis using a lateral approach. At the conclusion of the procedure ribbon shaped tissue marker clip was deployed into the biopsy cavity. Site 2: Lesion quadrant: Upper outer quadrant Using sterile technique and 1% Lidocaine as local anesthetic, under direct ultrasound visualization, a 14 gauge spring-loaded device was used to perform biopsy of the LEFT breast mass/lymph node at the 2 o'clock axis using a lateral approach. At the conclusion of the procedure coil shaped tissue marker clip was deployed into the biopsy cavity. Site 3: Lesion quadrant: Axilla Using sterile technique and 1% Lidocaine as local anesthetic, under direct ultrasound visualization, a 14 gauge spring-loaded device was used to perform biopsy of the enlarged lymph node in the LEFT axilla using a lateral approach. At the conclusion of the procedure Q shaped tissue marker clip was deployed into the biopsy cavity. Follow up 2 view mammogram was performed and dictated separately. IMPRESSION: 1. Ultrasound guided biopsy of the highly suspicious mass in the LEFT breast at the 1 o'clock axis. No apparent complications. 2. Ultrasound guided biopsy of the suspicious mass/lymph node in the LEFT breast at the 2 o'clock axis. No apparent complications. 3. Ultrasound guided biopsy of the enlarged lymph node in the LEFT axilla. No apparent complications. Electronically Signed: By: Franki Cabot M.D. On: 01/23/2020 15:38   MS DIGITAL DIAG TOMO BILAT  Result Date: 01/23/2020 CLINICAL DATA:  44 year old female presenting with a new lump in the left breast. EXAM: DIGITAL DIAGNOSTIC BILATERAL MAMMOGRAM WITH CAD AND TOMO ULTRASOUND LEFT BREAST COMPARISON:  None. ACR Breast Density Category b: There are scattered areas of fibroglandular density. FINDINGS:  Mammogram: Right breast: No suspicious mass, distortion, or microcalcifications are identified to suggest presence of malignancy. Left breast: Spot compression tomosynthesis views were performed in addition to full field standard views. There is an irregular spiculated mass at the palpable site of concern in the upper-outer quadrant of the left breast. The mass spans approximately 3.5 cm. Associated with the mass are several pleomorphic microcalcifications. In the far upper outer quadrant of the left breast there is an oval circumscribed mass measuring 0.9 cm, likely an intramammary lymph node. There are several enlarged left axillary lymph nodes. Mammographic images were processed with CAD. On physical exam, I palpate a fixed discrete mass at the palpable site of concern in the upper outer left breast. Ultrasound: Targeted ultrasound is performed in the left breast at 1  o'clock 5 cm from nipple demonstrating an irregular hypoechoic mass measuring 3.4 x 2.1 x 3.6 cm, which corresponds to the palpable abnormality. There is internal vascularity within the mass. Targeted ultrasound in the left breast at 2 o'clock 10 cm from the nipple demonstrates a round circumscribed hypoechoic mass with hilar flow measuring 0.8 x 0.8 x 0.8 cm, consistent with an intramammary lymph node. There is loss of the normal fatty hilum. Targeted ultrasound of the left axilla demonstrates at least two abnormal lymph nodes with cortical thickening measuring up to 1.7 cm. IMPRESSION: 1. Palpable left breast mass with associated calcifications overall measuring 3.6 cm is highly suspicious. 2. Left breast mass at 2 o'clock 10 cm from nipple is concerning for an abnormal intramammary lymph node. 3.  Left axillary adenopathy (at least 2 lymph nodes). RECOMMENDATION: Ultrasound-guided core needle biopsy x 3 for the above findings. This will be performed same day. I have discussed the findings and recommendations with the patient. BI-RADS CATEGORY  5:  Highly suggestive of malignancy. Electronically Signed   By: Audie Pinto M.D.   On: 01/23/2020 13:36   MM CLIP PLACEMENT LEFT  Result Date: 01/23/2020 CLINICAL DATA:  Status post 3 ultrasound-guided biopsies today. EXAM: DIAGNOSTIC LEFT MAMMOGRAM POST ULTRASOUND BIOPSY x3 COMPARISON:  Previous exam(s). FINDINGS: Mammographic images were obtained following ultrasound guided biopsies of masses in the LEFT breast at the 1 o'clock axis and 2 o'clock axis followed by ultrasound-guided biopsy of an enlarged lymph node in the LEFT axilla. All 3 biopsy marking clips are in expected position at the sites of biopsy. IMPRESSION: 1. Appropriate positioning of the ribbon shaped biopsy marking clip at the site of biopsy in the upper-outer quadrant of the LEFT breast corresponding to the mass at the 1 o'clock axis. 2. Appropriate positioning of the coil shaped biopsy marking clip at the site of biopsy in the upper-outer quadrant of the LEFT breast corresponding to the mass at the 2 o'clock axis. 3. Appropriate positioning of the Q shaped biopsy marking clip at the site of biopsy in the LEFT axilla. Final Assessment: Post Procedure Mammograms for Marker Placement Electronically Signed   By: Franki Cabot M.D.   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 Tumor infiltrating lymphocytes are noted. This was found to be concordant by Dr. Franki Cabot. Pathology revealed ONE LYMPH NODE, NEGATIVE FOR CARCINOMA of the Left breast, 2 o'clock axis. This was found to be discordant by Dr. Franki Cabot, with excision recommended. 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 This was found to be concordant by Dr. Franki Cabot. Pathology results were discussed with the patient by telephone by  Kathrine Haddock, Bilingual Patient Services Representative. The patient reported doing well after the biopsies with tenderness at the sites. Post biopsy instructions and care were reviewed and questions were answered. The patient was encouraged to call [facility] for any additional concerns. The patient was referred to [facility] [place] on Jan 29, 2020. Recommendation for a bilateral breast MRI for further evaluation of extent of disease and age. Pathology results reported by Terie Purser, RN on 01/24/2020. Electronically Signed   By: Franki Cabot M.D.   On:  01/24/2020 13:48   Result Date: 01/24/2020 CLINICAL DATA:  Patient presents for ultrasound-guided biopsy of a highly suspicious mass in the LEFT breast at the 1 o'clock axis. Patient also presents for ultrasound-guided biopsy of a suspicious mass/lymph node in the LEFT breast at the 2 o'clock axis. Finally, patient presents for ultrasound-guided biopsy of an enlarged/morphologically abnormal lymph node in the LEFT axilla. EXAM: ULTRASOUND GUIDED LEFT BREAST CORE NEEDLE BIOPSY ULTRASOUND-GUIDED LEFT BREAST CORE NEEDLE BIOPSY ULTRASOUND-GUIDED LEFT AXILLA CORE NEEDLE BIOPSY COMPARISON:  Previous exam(s). PROCEDURE: I met with the patient and we discussed the procedure of ultrasound-guided biopsy, including benefits and alternatives. We discussed the high likelihood of a successful procedure. We discussed the risks of the procedure, including infection, bleeding, tissue injury, clip migration, and inadequate sampling. Informed written consent was given. The usual time-out protocol was performed immediately prior to the procedure. Site 1: Lesion quadrant: Upper outer quadrant Using sterile technique and 1% Lidocaine as local anesthetic, under direct ultrasound visualization, a 12 gauge spring-loaded device was used to perform biopsy of the LEFT breast mass at the 1  o'clock axis using a lateral approach. At the conclusion of the procedure ribbon shaped tissue marker clip was deployed into the biopsy cavity. Site 2: Lesion quadrant: Upper outer quadrant Using sterile technique and 1% Lidocaine as local anesthetic, under direct ultrasound visualization, a 14 gauge spring-loaded device was used to perform biopsy of the LEFT breast mass/lymph node at the 2 o'clock axis using a lateral approach. At the conclusion of the procedure coil shaped tissue marker clip was deployed into the biopsy cavity. Site 3: Lesion quadrant: Axilla Using sterile technique and 1% Lidocaine as local anesthetic, under direct ultrasound visualization, a 14 gauge spring-loaded device was used to perform biopsy of the enlarged lymph node in the LEFT axilla using a lateral approach. At the conclusion of the procedure Q shaped tissue marker clip was deployed into the biopsy cavity. Follow up 2 view mammogram was performed and dictated separately. IMPRESSION: 1. Ultrasound guided biopsy of the highly suspicious mass in the LEFT breast at the 1 o'clock axis. No apparent complications. 2. Ultrasound guided biopsy of the suspicious mass/lymph node in the LEFT breast at the 2 o'clock axis. No apparent complications. 3. Ultrasound guided biopsy of the enlarged lymph node in the LEFT axilla. No apparent complications. Electronically Signed: By: Franki Cabot M.D. 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 Tumor infiltrating lymphocytes are noted. This was found to be concordant by Dr. Franki Cabot. Pathology revealed ONE LYMPH NODE, NEGATIVE FOR CARCINOMA of the Left breast, 2 o'clock axis. This was found to be discordant by Dr. Franki Cabot, with excision recommended. Pathology revealed METASTATIC CARCINOMA IN  ONE OF ONE LYMPH NODE of the Left axilla. This was found to be concordant by Dr. Franki Cabot. Pathology results were discussed with the patient by telephone by Kathrine Haddock, Bilingual Patient Services Representative. The patient reported doing well after the biopsies with tenderness at the sites. Post biopsy instructions and care were reviewed and questions were answered. The patient was encouraged to call [facility] for any additional concerns. The patient was referred to [facility] on Jan 29, 2020. Recommendation for a  bilateral breast MRI for further evaluation of extent of disease and age. Pathology results reported by Terie Purser, RN on 01/24/2020. Electronically Signed   By: Franki Cabot M.D.   On: 01/24/2020 13:48   Result Date: 01/24/2020 CLINICAL DATA:  Patient presents for ultrasound-guided biopsy of a highly suspicious mass in the LEFT breast at the 1 o'clock axis. Patient also presents for ultrasound-guided biopsy of a suspicious mass/lymph node in the LEFT breast at the 2 o'clock axis. Finally, patient presents for ultrasound-guided biopsy of an enlarged/morphologically abnormal lymph node in the LEFT axilla. EXAM: ULTRASOUND GUIDED LEFT BREAST CORE NEEDLE BIOPSY ULTRASOUND-GUIDED LEFT BREAST CORE NEEDLE BIOPSY ULTRASOUND-GUIDED LEFT AXILLA CORE NEEDLE BIOPSY COMPARISON:  Previous exam(s). PROCEDURE: I met with the patient and we discussed the procedure of ultrasound-guided biopsy, including benefits and alternatives. We discussed the high likelihood of a successful procedure. We discussed the risks of the procedure, including infection, bleeding, tissue injury, clip migration, and inadequate sampling. Informed written consent was given. The usual time-out protocol was performed immediately prior to the procedure. Site 1: Lesion quadrant: Upper outer quadrant Using sterile technique and 1%  Lidocaine as local anesthetic, under direct ultrasound visualization, a 12 gauge spring-loaded device was used to perform biopsy of the LEFT breast mass at the 1 o'clock axis using a lateral approach. At the conclusion of the procedure ribbon shaped tissue marker clip was deployed into the biopsy cavity. Site 2: Lesion quadrant: Upper outer quadrant Using sterile technique and 1% Lidocaine as local anesthetic, under direct ultrasound visualization, a 14 gauge spring-loaded device was used to perform biopsy of the LEFT breast mass/lymph node at the 2 o'clock axis using a lateral approach. At the conclusion of the procedure coil shaped tissue marker clip was deployed into the biopsy cavity. Site 3: Lesion quadrant: Axilla Using sterile technique and 1% Lidocaine as local anesthetic, under direct ultrasound visualization, a 14 gauge spring-loaded device was used to perform biopsy of the enlarged lymph node in the LEFT axilla using a lateral approach. At the conclusion of the procedure Q shaped tissue marker clip was deployed into the biopsy cavity. Follow up 2 view mammogram was performed and dictated separately. IMPRESSION: 1. Ultrasound guided biopsy of the highly suspicious mass in the LEFT breast at the 1 o'clock axis. No apparent complications. 2. Ultrasound guided biopsy of the suspicious mass/lymph node in the LEFT breast at the 2 o'clock axis. No apparent complications. 3. Ultrasound guided biopsy of the enlarged lymph node in the LEFT axilla. No apparent complications. Electronically Signed: By: Franki Cabot M.D. On: 01/23/2020 15:38      IMPRESSION/PLAN: This is a lovely 44 year old woman with left Breast Cancer   It was a pleasure meeting the patient today. We discussed the risks, benefits, and side effects of radiotherapy.  Genetic testing, staging scans, neoadjuvant chemotherapy, and breast conserving surgery are anticipated.   I recommend radiotherapy to the left breast and regional lymph nodes  to reduce her risk of locoregional recurrence by 2/3.  We discussed that radiation would take approximately 6 weeks to complete and that I would give the patient a few weeks to heal following surgery before starting treatment planning.  We spoke about acute effects including skin irritation and fatigue as well as much less common late effects including internal organ injury or irritation. We spoke about the latest technology that is used to minimize the risk of late effects for patients undergoing radiotherapy to the breast or chest wall. No guarantees of treatment  were given. The patient is enthusiastic about proceeding with treatment. I look forward to participating in the patient's care.  I will await her referral back to me for postoperative follow-up and eventual CT simulation/treatment planning.  We discussed measures to reduce the risk of infection during the COVID-19 pandemic.  We discussed the Pfizer vaccine in [employer] vaccine in detail.  Both of these vaccines are available as soon as Friday May 21st at the local health department.  The patient had many good questions about the vaccines and I answered them to the best of my ability.  After lengthy discussion, the patient expressed desire to receive the [employer] vaccine this Friday.  I helped her get scheduled for that.  I also helped her partner get scheduled for the [place] vaccine, per his wishes, on Friday.  Patient knows to ask to have the vaccine placed in her right arm, contralateral from her cancer, so that it does not cause confusion if any nodes react in the axilla or SCV region.  On date of service, in total, I spent 60 minutes on this encounter.  Patient was seen in person.   __________________________________________   Eppie Gibson, MD   This document serves as a record of services personally performed by Eppie Gibson, MD. It was created on her behalf by Wilburn Mylar, a trained medical scribe. The creation of  this record is based on the scribe's personal observations and the provider's statements to them. This document has been checked and approved by the attending provider.

## 2020-01-29 NOTE — Therapy (Signed)
[place], [place], [address] Phone: [phone]   Fax:  [phone]  Physical Therapy Evaluation  Patient Details  Name: [name] MRN: [id number] Date of Birth: 1976/03/05 Referring Provider (PT): Dr. Rolm Bookbinder   Encounter Date: 01/29/2020  PT End of Session - 01/29/20 1408    Visit Number  1    Number of Visits  2    Date for PT Re-Evaluation  07/31/20    PT Start Time  5188    PT Stop Time  4166   Also saw pt from 1520-1545 for a total of 33 minutes   PT Time Calculation (min)  8 min    Activity Tolerance  Patient tolerated treatment well    Behavior During Therapy  [facility] for tasks assessed/performed       Past Medical History:  Diagnosis Date  . Allergy   . Asthma   . Chronic back pain   . Hx of migraines   . Medical history non-contributory     Past Surgical History:  Procedure Laterality Date  . CESAREAN SECTION WITH BILATERAL TUBAL LIGATION Bilateral 07/23/2015   Procedure: CESAREAN SECTION WITH BILATERAL TUBAL LIGATION;  Surgeon: Donnamae Jude, MD;  Location: [facility];  Service: Obstetrics;  Laterality: Bilateral;  . TUBAL LIGATION      There were no vitals filed for this visit.   Subjective Assessment - 01/29/20 1357    Subjective  Patient reports she is here today to be seen by her medical team for her newly diagnosed left breast cancer.    Patient is accompained by:  Interpreter;Family member    Pertinent History  Patient was diagnosed on 01/23/2020 with left grade III invasive ductal carcinoma breast cancer. It measures 3.6 cm and is located in the upper outer quadrant. It is ER positive, PR negative, and HER2 negative with a Ki67 of 40%. She has 2 abnormal appearing lymph nodes and 1 was biopsied and found to be positive.    Patient Stated Goals  Reduce lymphedema risk and learn post op shoulder ROM HEP    Currently in Pain?  Yes   Pt with difficulty describing but  sounds like back and feet pain        [facility] PT Assessment - 01/29/20 0001      Assessment   Medical Diagnosis  Left breast cancer    Referring Provider (PT)  Dr. Rolm Bookbinder    Onset Date/Surgical Date  01/23/20    Hand Dominance  Right    Prior Therapy  none      Precautions   Precautions  Other (comment)    Precaution Comments  active cancer      Restrictions   Weight Bearing Restrictions  No      Balance Screen   Has the patient fallen in the past 6 months  No    Has the patient had a decrease in activity level because of a fear of falling?   No    Is the patient reluctant to leave their home because of a fear of falling?   No      Home Environment   Living Environment  Private residence    Living Arrangements  Spouse/significant other;Children   Husband, 4, 38, and 65 y.o. girls   Available Help at Discharge  Family      Prior Function   Level of [place]  Unemployed    Leisure  She does not exercise      Cognition   Overall Cognitive Status  Within Functional Limits for tasks assessed      Posture/Postural Control   Posture/Postural Control  Postural limitations    Postural Limitations  Rounded Shoulders;Forward head      ROM / Strength   AROM / PROM / Strength  AROM;Strength      AROM   AROM Assessment Site  Shoulder    Right/Left Shoulder  Right;Left    Right Shoulder Extension  50 Degrees    Right Shoulder Flexion  155 Degrees    Right Shoulder ABduction  166 Degrees    Right Shoulder Internal Rotation  59 Degrees    Right Shoulder External Rotation  84 Degrees    Left Shoulder Extension  59 Degrees    Left Shoulder Flexion  144 Degrees    Left Shoulder ABduction  159 Degrees    Left Shoulder Internal Rotation  61 Degrees    Left Shoulder External Rotation  79 Degrees      Strength   Overall Strength  Within functional limits for tasks performed        LYMPHEDEMA/ONCOLOGY QUESTIONNAIRE - 01/29/20 1406       Type   Cancer Type  Left breast cancer      Lymphedema Assessments   Lymphedema Assessments  Upper extremities      Right Upper Extremity Lymphedema   10 cm Proximal to Olecranon Process  35 cm    Olecranon Process  28.5 cm    10 cm Proximal to Ulnar Styloid Process  27.8 cm    Just Proximal to Ulnar Styloid Process  17.9 cm    Across Hand at [employer]  9.6 cm    At [place] of 2nd Digit  6.6 cm      Left Upper Extremity Lymphedema   10 cm Proximal to Olecranon Process  34.5 cm    Olecranon Process  28.8 cm    10 cm Proximal to Ulnar Styloid Process  26.9 cm    Just Proximal to Ulnar Styloid Process  18.8 cm    Across Hand at [employer]  20 cm    At [place] of 2nd Digit  6.3 cm      L-DEX FLOWSHEETS - 01/29/20 1400      L-DEX LYMPHEDEMA SCREENING   Measurement Type  Unilateral    L-DEX MEASUREMENT EXTREMITY  Upper Extremity    POSITION   Standing    DOMINANT SIDE  Right    At Risk Side  Left    BASELINE SCORE (UNILATERAL)  3       The patient was assessed using the L-Dex machine today to produce a lymphedema index baseline score. The patient will be reassessed on a regular basis (typically every 3 months) to obtain new L-Dex scores. If the score is > 6.5 points away from his/her baseline score indicating onset of subclinical lymphedema, it will be recommended to wear a compression garment for 4 weeks, 12 hours per day and then be reassessed. If the score continues to be > 6.5 points from baseline at reassessment, we will initiate lymphedema treatment. Assessing in this manner has a 95% rate of preventing clinically significant lymphedema.        Objective measurements completed on examination: See above findings.        Patient was instructed today in a home exercise program today for post op shoulder range of motion. These included active assist  shoulder flexion in sitting, scapular retraction, wall walking with shoulder abduction, and hands behind head external  rotation.  She was encouraged to do these twice a day, holding 3 seconds and repeating 5 times when permitted by her physician.          PT Education - 01/29/20 1407    Education Details  Lymphedema risk reduction and post op shoulder ROM HEP    Person(s) Educated  Patient    Methods  Explanation;Demonstration;Handout    Comprehension  Returned demonstration;Verbalized understanding          PT Long Term Goals - 01/29/20 1428      PT LONG TERM GOAL #1   Title  Patient will be able to demonstrate she has regained full shoulder ROM and function post operatively compared to baselines.    Time  6    Period  Months    Status  New    Target Date  07/31/20      Breast Clinic Goals - 01/29/20 1427      Patient will be able to verbalize understanding of pertinent lymphedema risk reduction practices relevant to her diagnosis specifically related to skin care.   Baseline  No knowledge    Time  1    Period  Days    Status  Achieved      Patient will be able to return demonstrate and/or verbalize understanding of the post-op home exercise program related to regaining shoulder range of motion.   Baseline  No knowledge    Time  1    Period  Days    Status  Achieved      Patient will be able to verbalize understanding of the importance of attending the postoperative After Breast Cancer Class for further lymphedema risk reduction education and therapeutic exercise.   Baseline  No knowledge    Time  1    Period  Days    Status  Achieved            Plan - 01/29/20 1408    Clinical Impression Statement  Patient was diagnosed on 01/23/2020 with left grade III invasive ductal carcinoma breast cancer. It measures 3.6 cm and is located in the upper outer quadrant. It is ER positive, PR negative, and HER2 negative with a Ki67 of 40%. She has 2 abnormal appearing lymph nodes and 1 was biopsied and found to be positive. Her multidisciplinary medical team met prior to her assessments to  determine a recommended treatment plan. She is planning to have neoadjuvant chemotherapy followed by a left lumpectomy and targeted axillary lymph node dissection, radiation,and anti-estrogen therapy. She will benefit from a post op PT reassessment to determine needs and from L-Dex screenings every 3 months to assess for subclinical lymphedema.    Stability/Clinical Decision Making  Stable/Uncomplicated    Clinical Decision Making  Low    Rehab Potential  Excellent    PT Frequency  --   Eval and 1 f/u post op and L-Dex screenings every 3 months   PT Treatment/Interventions  ADLs/Self Care Home Management;Therapeutic exercise;Patient/family education    PT Next Visit Plan  Will reassess 3-4 weeks post op to determine needs    PT Home Exercise Plan  Post op shoulder ROM HEP    Consulted and Agree with Plan of Care  Patient;Family member/caregiver       Patient will benefit from skilled therapeutic intervention in order to improve the following deficits and impairments:  Postural dysfunction, Decreased range of motion,  Pain, Impaired UE functional use, Decreased knowledge of precautions  Visit Diagnosis: Malignant neoplasm of upper-outer quadrant of left breast in female, estrogen receptor positive ([place]) - Plan: PT plan of care cert/re-cert  Abnormal posture - Plan: PT plan of care cert/re-cert   Patient will follow up at outpatient cancer rehab 3-4 weeks following surgery.  If the patient requires physical therapy at that time, a specific plan will be dictated and sent to the referring physician for approval. The patient was educated today on appropriate basic range of motion exercises to begin post operatively and the importance of attending the After Breast Cancer class following surgery.  Patient was educated today on lymphedema risk reduction practices as it pertains to recommendations that will benefit the patient immediately following surgery.  She verbalized good understanding.       Problem List Patient Active Problem List   Diagnosis Date Noted  . Malignant neoplasm of upper-outer quadrant of left breast in female, estrogen receptor positive ([place]) 01/27/2020  . SOB (shortness of breath) 01/10/2017  . Pregnancy 07/23/2015  . Previous cesarean delivery, antepartum condition or complication 82/88/3374  . Encounter for sterilization 07/08/2015  . Advanced maternal age in multigravida   . Impaired ability to use community resources due to language barrier 07/13/2013   Annia Friendly, PT 01/29/20 3:50 PM  [place], [place], [address] Phone: [phone]   Fax:  [phone]  Name: [name] MRN: [id number] Date of Birth: Dec 23, 1975

## 2020-01-29 NOTE — Patient Instructions (Signed)

## 2020-01-30 ENCOUNTER — Encounter: Payer: Self-pay | Admitting: Licensed Clinical Social Worker

## 2020-01-30 NOTE — Progress Notes (Signed)
REFERRING PROVIDER: Chauncey Cruel, MD [address] [place],  [place] [address]  PRIMARY PROVIDER:  Rutherford Guys, MD  PRIMARY REASON FOR VISIT:  1. Malignant neoplasm of upper-outer quadrant of left breast in female, estrogen receptor positive ([place])    I connected with [name] on 01/29/2020 at 3:45 PM EDT by Webex and verified that I am speaking with the correct person using two identifiers.    Patient location: [facility] Provider location: [facility]   HISTORY OF PRESENT ILLNESS:   [name], a 44 y.o. female, was seen for a [place] cancer genetics consultation at the request of Dr. Jana Hakim due to her recent diagnosis of breast cancer. [name] presents to clinic today to discuss the possibility of a hereditary predisposition to cancer, genetic testing, and to further clarify her future cancer risks, as well as potential cancer risks for family members.   In 2021, at the age of 38, [name] was diagnosed with invasive ductal carcinoma of the left breast, ER+/PR-/Her2-, functionally triple negative. The treatment plan includes neoadjuvant chemotherapy, surgery and radiation.   CANCER HISTORY:  Oncology History  Malignant neoplasm of upper-outer quadrant of left breast in female, estrogen receptor positive ([place])  01/27/2020 Initial Diagnosis   Malignant neoplasm of upper-outer quadrant of left breast in female, estrogen receptor positive ([place])   02/12/2020 -  Chemotherapy   The patient had DOXOrubicin (ADRIAMYCIN) chemo injection 118 mg, 60 mg/m2, Intravenous,  Once, 0 of 4 cycles PALONOSETRON HCL INJECTION 0.25 MG/5ML, 0.25 mg, Intravenous,  Once, 0 of 8 cycles pegfilgrastim-jmdb (FULPHILA) injection 6 mg, 6 mg, Subcutaneous,  Once, 0 of 4 cycles CARBOplatin (PARAPLATIN) in sodium chloride 0.9 % 100 mL chemo infusion, , Intravenous,  Once, 0 of 4 cycles cyclophosphamide (CYTOXAN) 1,180 mg in sodium chloride 0.9 % 250 mL chemo infusion, 600 mg/m2, Intravenous,   Once, 0 of 4 cycles PACLitaxel (TAXOL) 156 mg in sodium chloride 0.9 % 250 mL chemo infusion (</= '80mg'$ /m2), 80 mg/m2, Intravenous,  Once, 0 of 4 cycles FOSAPREPITANT IV INFUSION 150 MG, 150 mg, Intravenous,  Once, 0 of 8 cycles  for chemotherapy treatment.      RISK FACTORS:  Menarche was at age 49.  First live birth at age 10.  Ovaries intact: yes.  Hysterectomy: no.  Menopausal status: premenopausal.  HRT use: 0 years. Colonoscopy: no; not examined. Mammogram within the last year: yes. Number of breast biopsies: 1. Up to date with pelvic exams: yes. Any excessive radiation exposure in the past: no  Past Medical History:  Diagnosis Date  . Allergy   . Asthma   . Chronic back pain   . Hx of migraines   . Medical history non-contributory     Past Surgical History:  Procedure Laterality Date  . CESAREAN SECTION WITH BILATERAL TUBAL LIGATION Bilateral 07/23/2015   Procedure: CESAREAN SECTION WITH BILATERAL TUBAL LIGATION;  Surgeon: Donnamae Jude, MD;  Location: [facility];  Service: Obstetrics;  Laterality: Bilateral;  . TUBAL LIGATION      Social History   Socioeconomic History  . Marital status: Married    Spouse name: Not on file  . Number of children: 3  . Years of education: Not on file  . Highest education level: 9th grade  Occupational History  . Not on file  Tobacco Use  . Smoking status: Never Smoker  . Smokeless tobacco: Never Used  Substance and Sexual Activity  . Alcohol use: No  . Drug use: No  .  Sexual activity: Yes    Birth control/protection: Surgical  Other Topics Concern  . Not on file  Social History Narrative  . Not on file   Social Determinants of Health   Financial Resource Strain:   . Difficulty of Paying Living Expenses:   Food Insecurity:   . Worried About Charity fundraiser in the Last Year:   . Arboriculturist in the Last Year:   Transportation Needs: No Transportation Needs  . Lack of Transportation (Medical): No  . Lack of  Transportation (Non-Medical): No  Physical Activity:   . Days of Exercise per Week:   . Minutes of Exercise per Session:   Stress:   . Feeling of Stress :   Social Connections:   . Frequency of Communication with Friends and Family:   . Frequency of Social Gatherings with Friends and Family:   . Attends Religious Services:   . Active Member of Clubs or Organizations:   . Attends Archivist Meetings:   Marland Kitchen Marital Status:      FAMILY HISTORY:  We obtained a detailed, 4-generation family history.  Significant diagnoses are listed below: No family history on file.  [name] reports no family history of cancer. She has 3 daughters, 3 paternal half brothers, 1 paternal half sister, 2 maternal half brothers, 1 maternal half sister.  [name]'s mother is living at 8, patient has 3 maternal uncles, 4 maternal aunts. Maternal grandmother passed at 41, grandfather passed at 41.  [name]'s father is living at 46, patient has 2 paternal aunts, 3 paternal uncles. Paternal grandfather passed in his 90s, grandmother passed in her 54s but patient is unsure of the cause.   [name] is unaware of previous family history of genetic testing for hereditary cancer risks. Patient's maternal and paternal families are from [place]. There is no reported Ashkenazi Jewish ancestry. There is no known consanguinity.  GENETIC COUNSELING ASSESSMENT: [name] is a 44 y.o. female with a personal history of triple negative breast cancer at 21 which is somewhat suggestive of a hereditary cancer syndrome and predisposition to cancer. We, therefore, discussed and recommended the following at today's visit.   DISCUSSION: We discussed that 5 - 10% of breast cancer is hereditary, with most cases associated with BRCA1/BRCA2 mutations.  There are other genes that can be associated with hereditary breast cancer syndromes.  These include CHEK2, ATM, PALB2.   We discussed that testing is beneficial for  several reasons including surgical decision-making for breast cancer, knowing how to follow individuals after completing their treatment, and understand if other family members could be at risk for cancer and allow them to undergo genetic testing.   We reviewed the characteristics, features and inheritance patterns of hereditary cancer syndromes. We also discussed genetic testing, including the appropriate family members to test, the process of testing, insurance coverage and turn-around-time for results. We discussed the implications of a negative, positive and/or variant of uncertain significant result. We recommended [name] pursue genetic testing for the [facility] Common Hereditary Cancers gene panel.   The Common Hereditary Cancers Panel offered by [facility] includes sequencing and/or deletion duplication testing of the following 48 genes: APC, ATM, AXIN2, BARD1, BMPR1A, BRCA1, BRCA2, BRIP1, CDH1, CDKN2A (p14ARF), CDKN2A (p16INK4a), CKD4, CHEK2, CTNNA1, DICER1, EPCAM (Deletion/duplication testing only), GREM1 (promoter region deletion/duplication testing only), KIT, MEN1, MLH1, MSH2, MSH3, MSH6, MUTYH, NBN, NF1, NHTL1, PALB2, PDGFRA, PMS2, POLD1, POLE, PTEN, RAD50, RAD51C, RAD51D, RNF43, SDHB, SDHC, SDHD, SMAD4, SMARCA4. STK11,  TP53, TSC1, TSC2, and VHL.  The following genes were evaluated for sequence changes only: SDHA and HOXB13 c.251G>A variant only.  Based on [name]'s personal history of cancer, she meets medical criteria for genetic testing. Despite that she meets criteria, she may still have an out of pocket cost. An [facility] Patient Assistance Program (PAP) form was emailed to the patient to fill out.   PLAN: After considering the risks, benefits, and limitations, [name] provided informed consent to pursue genetic testing and the blood sample was sent to [facility] for analysis of the Common Hereditary Cancers Panel. Results should be available within approximately 2-3 weeks'  time, at which point they will be disclosed by telephone to [name], as will any additional recommendations warranted by these results. [name] will receive a summary of her genetic counseling visit and a copy of her results once available. This information will also be available in Epic.   [name]'s questions were answered to her satisfaction today. Our contact information was provided should additional questions or concerns arise. Thank you for the referral and allowing [place] to share in the care of your patient.   Faith Rogue, MS, [facility] Genetic Counselor [email or web address] Phone: [phone]  The patient was seen for a total of 15 minutes in face-to-face genetic counseling. Patient's husband and Spanish interpreter were also present.  Drs. Magrinat, Lindi Adie and/or Burr Medico were available for discussion regarding this case.   _______________________________________________________________________ For Office Staff:  Number of people involved in session: 3 Was an Intern/ student involved with case: no

## 2020-02-04 ENCOUNTER — Other Ambulatory Visit: Payer: Self-pay

## 2020-02-04 ENCOUNTER — Encounter (HOSPITAL_BASED_OUTPATIENT_CLINIC_OR_DEPARTMENT_OTHER): Payer: Self-pay | Admitting: General Surgery

## 2020-02-05 ENCOUNTER — Other Ambulatory Visit: Payer: No Typology Code available for payment source

## 2020-02-05 ENCOUNTER — Other Ambulatory Visit: Payer: Self-pay | Admitting: General Surgery

## 2020-02-05 ENCOUNTER — Other Ambulatory Visit (HOSPITAL_COMMUNITY): Payer: No Typology Code available for payment source

## 2020-02-05 ENCOUNTER — Encounter: Payer: Self-pay | Admitting: General Practice

## 2020-02-05 ENCOUNTER — Telehealth: Payer: Self-pay | Admitting: Oncology

## 2020-02-05 ENCOUNTER — Encounter: Payer: Self-pay | Admitting: Pharmacy Technician

## 2020-02-05 NOTE — Progress Notes (Signed)
Pharmacist Chemotherapy Monitoring - Initial Assessment    Anticipated start date: 02/12/20  Regimen:  . Are orders appropriate based on the patient's diagnosis, regimen, and cycle? Yes . Does the plan date match the patient's scheduled date? Yes . Is the sequencing of drugs appropriate? Yes . Are the premedications appropriate for the patient's regimen? Yes . Prior Authorization for treatment is: Uninsured o If applicable, is the correct biosimilar selected based on the patient's insurance? yes  Organ Function and Labs: Marland Kitchen Are dose adjustments needed based on the patient's renal function, hepatic function, or hematologic function? Yes . Are appropriate labs ordered prior to the start of patient's treatment? Yes . Other organ system assessment, if indicated: trastuzumab: Echo/ MUGA . The following baseline labs, if indicated, have been ordered: N/A  Dose Assessment: . Are the drug doses appropriate? Yes . Are the following correct: o Drug concentrations Yes o IV fluid compatible with drug Yes o Administration routes Yes o Timing of therapy Yes . If applicable, does the patient have documented access for treatment and/or plans for port-a-cath placement? yes . If applicable, have lifetime cumulative doses been properly documented and assessed? not applicable Lifetime Dose Tracking  No doses have been documented on this patient for the following tracked chemicals: Doxorubicin, Epirubicin, Idarubicin, Daunorubicin, Mitoxantrone, Bleomycin, Oxaliplatin, Carboplatin, Liposomal Doxorubicin  o   Toxicity Monitoring/Prevention: . The patient has the following take home antiemetics prescribed: Dexamethasone . The patient has the following take home medications prescribed: N/A . Medication allergies and previous infusion related reactions, if applicable, have been reviewed and addressed. Yes . The patient's current medication list has been assessed for drug-drug interactions with their  chemotherapy regimen. no significant drug-drug interactions were identified on review.  Order Review: . Are the treatment plan orders signed? No . Is the patient scheduled to see a provider prior to their treatment? Yes  I verify that I have reviewed each item in the above checklist and answered each question accordingly.  [name] 02/05/2020 9:01 AM

## 2020-02-05 NOTE — Progress Notes (Signed)
Patient has been approved for drug assistance by [employer] for [employer]. The enrollment period is from 02/04/20-02/02/21 based on self pay. First DOS covered is 02/14/20.

## 2020-02-05 NOTE — Progress Notes (Signed)
[facility] Distress Screening [place] by phone with [facility] [phone] following [facility] to introduce [facility] team/resources, reviewing distress screen per protocol.  The patient scored a 2 on the Psychosocial Distress Thermometer which indicates mild distress. Also assessed for distress and other psychosocial needs.   [facility] DISTRESS SCREENING 02/05/2020  Screening Type Initial Screening  Distress experienced in past week (1-10) 2  Referral to support programs Yes   [name] reports great support from family, friends, and faith. She has three daughters (12, 69, 4) who keep her focused on the present moment. She reports no needs or concerns at this time.   Follow up needed: Yes.  We plan to follow up by phone with interpreter in ca 2 weeks (or to meet with interpreter when she is on campus for treatment).   [place], [place], [facility] Pager [phone] Voicemail [phone]

## 2020-02-05 NOTE — Telephone Encounter (Signed)
Scheduled appts per 5/19 los. Representative from [employer] left a voicemail with next appt date and time. Pt to get an updated calendar at next appt per appt notes.

## 2020-02-06 ENCOUNTER — Telehealth: Payer: Self-pay | Admitting: *Deleted

## 2020-02-06 ENCOUNTER — Encounter: Payer: Self-pay | Admitting: *Deleted

## 2020-02-06 NOTE — Telephone Encounter (Signed)
Pt called by [facility] interpreter to assess needs or questions from [facility] on 5.19.21

## 2020-02-06 NOTE — Progress Notes (Signed)
[place]  [phone] [phone]     ID: [name] DOB: [birth year]  MR#: [id number]  NLZ#:[id number]  Patient Care Team: Rutherford Guys, MD as PCP - General (Family Medicine) Rolm Bookbinder, MD as Consulting Physician (General Surgery) Chelsia Serres, Virgie Dad, MD as Consulting Physician (Oncology) Eppie Gibson, MD as Attending Physician (Radiation Oncology) Rockwell Germany, RN as Oncology Nurse Navigator Mauro Kaufmann, RN as Oncology Nurse Navigator Chauncey Cruel, MD OTHER MD:  CHIEF COMPLAINT: Triple negative breast cancer  CURRENT TREATMENT: Neoadjuvant chemotherapy   INTERVAL HISTORY: [name] returns today for follow up of her triple negative breast cancer. She was evaluated in the multidisciplinary breast cancer clinic on 01/29/2020.  She is accompanied by her husband Celfin  She had an echocardiogram earlier this morning.  She had a CT scan of the chest and is completing a bone scan later this morning.  All those results are pending at this time.  She is scheduled for port placement on 02/11/2020, first chemotherapy dose 02/12/2020, and breast MRI on 02/13/2020    REVIEW OF SYSTEMS: [name] generally feels well and has no symptoms suggestive of metastatic disease.  She has occasional headaches.  She needs reading glasses.  She has no alteration of taste, no nausea or vomiting, no dizziness or gait imbalance, no cough phlegm production or pleurisy, no change in bowel or bladder habits, no unexplained weight loss or fatigue, no bleeding, rash, or fever.  She is appropriately concerned about hair loss loss.  A detailed review of systems today was otherwise noncontributory   HISTORY OF CURRENT ILLNESS: From the original intake note:  [name] herself palpated a left beast lump with associated pain. She underwent bilateral diagnostic mammography with tomography and left breast ultrasonography at [facility] on 01/23/2020  showing: breast density category B; palpable 3.6 cm left breast mass with associated calcifications at 1 o'clock; 0.8 cm left breast mass at 2 o'clock, concerning for abnormal intramammary lymph node; at least two abnormal left axillary lymph nodes.  Accordingly on 01/23/2020 she proceeded to biopsy of the left breast area in question. 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 Prognostic indicators significant for: estrogen receptor, 20% positive with moderate staining intensity and progesterone receptor, 0% negative. Proliferation marker Ki67 at 40%. HER2 negative by immunohistochemistry (1+).  The left axillary lymph node confirmed metastatic carcinoma.  The left intramammary lymph node was negative for carcinoma.  The patient's subsequent history is as detailed below.   PAST MEDICAL HISTORY: Past Medical History:  Diagnosis Date  . Allergy   . Asthma   . Chronic back pain   . Hx of migraines   . Medical history non-contributory     PAST SURGICAL HISTORY: Past Surgical History:  Procedure Laterality Date  . CESAREAN SECTION WITH BILATERAL TUBAL LIGATION Bilateral 07/23/2015   Procedure: CESAREAN SECTION WITH BILATERAL TUBAL LIGATION;  Surgeon: Donnamae Jude, MD;  Location: [facility];  Service: Obstetrics;  Laterality: Bilateral;  . TUBAL LIGATION      FAMILY HISTORY: No family history on file.  Her parents are both living as of 01/2020, her father age 54 and her mother age 90. She has three maternal half-siblings (1 sister, 2 brothers) and four paternal half-siblings (1 sister, 3 brothers). There is no family history of cancer to her knowledge.   GYNECOLOGIC HISTORY:  Patient's last menstrual period was 01/20/2020 (exact date). Menarche: 44 years old  Age at first live birth: 44 years old [place] P 3 LMP 01/2020 Contraceptive s/p tubal ligation HRT n/a  Hysterectomy? no BSO? 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 Husband Vaughan Sine works for a Financial trader, both in [employer]. They are both from [place]. She lives at home with [place] and their three children-- [name], [name], and [name]. She attends an [employer].    ADVANCED DIRECTIVES: In the absence of any documentation to the contrary, the patient's spouse is their HCPOA.    HEALTH MAINTENANCE: Social History   Tobacco Use  . Smoking status: Never Smoker  . Smokeless tobacco: Never Used  Substance Use Topics  . Alcohol use: No  . Drug use: No     Colonoscopy: n/a (age)  PAP: 11/2017, negative (per patient)  Bone density: n/a (age)   No Known Allergies  Current Outpatient Medications  Medication Sig Dispense Refill  . albuterol (VENTOLIN HFA) 108 (90 Base) MCG/ACT inhaler Inhale into the lungs every 6 (six) hours as needed for wheezing or shortness of breath.    . dexamethasone (DECADRON) 4 MG tablet Take 2 tablets (8 mg) by mouth twice daily with food starting the day after chemotherapy (day 2), continue day3, take the last dose the morning of day 4 30 tablet 1  . ibuprofen (ADVIL,MOTRIN) 800 MG tablet Take 1 tablet (800 mg total) by mouth 3 (three) times daily. 21 tablet 0  . lidocaine-prilocaine (EMLA) cream Apply to affected area once 30 g 3  . loratadine (CLARITIN) 10 MG tablet Take 1 tablet (10 mg total) by mouth daily. 60 tablet 0  . LORazepam (ATIVAN) 0.5 MG tablet Take 1 tablet at bedtime on days 1 ,2 and 3 of each chemotherapy treatment, then at bedtime as needed 30 tablet 0  . prochlorperazine (COMPAZINE) 10 MG tablet Take 1 tablet (10 mg total) by mouth 3 (three) times daily before meals for 3 days. 30 tablet 1   No current facility-administered medications for this visit.   Facility-Administered Medications Ordered in Other Visits  Medication Dose Route Frequency Provider Last Rate Last Admin  . sodium chloride (PF)  0.9 % injection             OBJECTIVE: [name] who appears stated age  Vitals:   02/07/20 1002  BP: (!) 116/58  Pulse: 80  Resp: 17  Temp: 98.5 F (36.9 C)  SpO2: 99%     Body mass index is 35.37 kg/m.   Wt Readings from Last 3 Encounters:  02/07/20 193 lb 6.4 oz (87.7 kg)  01/29/20 196 lb 11.2 oz (89.2 kg)  01/23/20 197 lb (89.4 kg)      ECOG FS:1 - Symptomatic but completely ambulatory  Sclerae unicteric, EOMs intact Wearing a mask No cervical or supraclavicular adenopathy Lungs no rales or rhonchi Heart regular rate and rhythm Abd soft, nontender, positive bowel sounds MSK no focal spinal tenderness, no upper extremity lymphedema Neuro: nonfocal, well oriented, appropriate affect Breasts: The right breast is benign.  In the upper outer left breast there is a palpable mass measuring 2 to 3 cm, movable, without overlying erythema or skin involvement, without nipple involvement.  Both axillae are benign.   LAB RESULTS:  CMP     Component Value Date/Time   NA 141 01/29/2020 1212   NA 138 08/15/2017 1525   K 4.6 01/29/2020 1212   CL 104 01/29/2020 1212  CO2 28 01/29/2020 1212   GLUCOSE 106 (H) 01/29/2020 1212   BUN 17 01/29/2020 1212   BUN 12 08/15/2017 1525   CREATININE 0.79 01/29/2020 1212   CALCIUM 10.2 01/29/2020 1212   PROT 7.6 01/29/2020 1212   PROT 7.4 01/03/2017 1642   ALBUMIN 3.8 01/29/2020 1212   ALBUMIN 4.3 01/03/2017 1642   AST 21 01/29/2020 1212   ALT 26 01/29/2020 1212   ALKPHOS 100 01/29/2020 1212   BILITOT 0.3 01/29/2020 1212   GFRNONAA >60 01/29/2020 1212   GFRAA >60 01/29/2020 1212    No results found for: TOTALPROTELP, ALBUMINELP, A1GS, A2GS, BETS, BETA2SER, GAMS, MSPIKE, SPEI  Lab Results  Component Value Date   WBC 6.4 01/29/2020   NEUTROABS 3.3 01/29/2020   HGB 13.2 01/29/2020   HCT 41.0 01/29/2020   MCV 85.8 01/29/2020   PLT 224 01/29/2020    No results found for: LABCA2  No components found for: FXTKWI097  No  results for input(s): INR in the last 168 hours.  No results found for: LABCA2  No results found for: DZH299  No results found for: MEQ683  No results found for: MHD622  No results found for: CA2729  No components found for: HGQUANT  No results found for: CEA1 / No results found for: CEA1   No results found for: AFPTUMOR  No results found for: CHROMOGRNA  No results found for: KPAFRELGTCHN, LAMBDASER, KAPLAMBRATIO (kappa/lambda light chains)  No results found for: HGBA, HGBA2QUANT, HGBFQUANT, HGBSQUAN (Hemoglobinopathy evaluation)   No results found for: LDH  No results found for: IRON, TIBC, IRONPCTSAT (Iron and TIBC)  No results found for: FERRITIN  Urinalysis No results found for: COLORURINE, APPEARANCEUR, LABSPEC, PHURINE, GLUCOSEU, HGBUR, BILIRUBINUR, KETONESUR, PROTEINUR, UROBILINOGEN, NITRITE, LEUKOCYTESUR   STUDIES: US BREAST LTD UNI LEFT INC AXILLA  Result Date: 01/23/2020 CLINICAL DATA:  44 year old female presenting with a new lump in the left breast. EXAM: DIGITAL DIAGNOSTIC BILATERAL MAMMOGRAM WITH CAD AND TOMO ULTRASOUND LEFT BREAST COMPARISON:  None. ACR Breast Density Category b: There are scattered areas of fibroglandular density. FINDINGS: Mammogram: Right breast: No suspicious mass, distortion, or microcalcifications are identified to suggest presence of malignancy. Left breast: Spot compression tomosynthesis views were performed in addition to full field standard views. There is an irregular spiculated mass at the palpable site of concern in the upper-outer quadrant of the left breast. The mass spans approximately 3.5 cm. Associated with the mass are several pleomorphic microcalcifications. In the far upper outer quadrant of the left breast there is an oval circumscribed mass measuring 0.9 cm, likely an intramammary lymph node. There are several enlarged left axillary lymph nodes. Mammographic images were processed with CAD. On physical exam, I palpate a  fixed discrete mass at the palpable site of concern in the upper outer left breast. Ultrasound: Targeted ultrasound is performed in the left breast at 1 o'clock 5 cm from nipple demonstrating an irregular hypoechoic mass measuring 3.4 x 2.1 x 3.6 cm, which corresponds to the palpable abnormality. There is internal vascularity within the mass. Targeted ultrasound in the left breast at 2 o'clock 10 cm from the nipple demonstrates a round circumscribed hypoechoic mass with hilar flow measuring 0.8 x 0.8 x 0.8 cm, consistent with an intramammary lymph node. There is loss of the normal fatty hilum. Targeted ultrasound of the left axilla demonstrates at least two abnormal lymph nodes with cortical thickening measuring up to 1.7 cm. IMPRESSION: 1. Palpable left breast mass with associated calcifications overall measuring 3.6 cm is  highly suspicious. 2. Left breast mass at 2 o'clock 10 cm from nipple is concerning for an abnormal intramammary lymph node. 3.  Left axillary adenopathy (at least 2 lymph nodes). RECOMMENDATION: Ultrasound-guided core needle biopsy x 3 for the above findings. This will be performed same day. I have discussed the findings and recommendations with the patient. BI-RADS CATEGORY  5: Highly suggestive of malignancy. Electronically Signed   By: Audie Pinto M.D.   On: 01/23/2020 13:36   ECHOCARDIOGRAM COMPLETE  Result Date: 02/07/2020    ECHOCARDIOGRAM REPORT   Patient Name:   ILIYANA CONVEY Thomas Date of Exam: 02/07/2020 Medical Rec #:  [id number]       Height:       62.0 in Accession #:    [id number]      Weight:       196.0 lb Date of Birth:  01/04/1976       BSA:          1.895 m Patient Age:    89 years        BP:           127/82 mmHg Patient Gender: F               HR:           62 bpm. Exam Location:  Outpatient Procedure: 2D Echo, Cardiac Doppler, Color Doppler and Strain Analysis Indications:    Z51.11 Encounter for antineoplastic chemotheraphy  History:        Patient has no prior history  of Echocardiogram examinations.  Sonographer:    Jonelle Sidle Dance Referring Phys: [place]  1. Left ventricular ejection fraction, by estimation, is 55 to 60%. The left ventricle has normal function. The left ventricle has no regional wall motion abnormalities. Left ventricular diastolic parameters were normal.  2. Right ventricular systolic function is normal. The right ventricular size is normal.  3. The mitral valve is normal in structure. No evidence of mitral valve regurgitation. No evidence of mitral stenosis.  4. The aortic valve is normal in structure. Aortic valve regurgitation is not visualized. No aortic stenosis is present. FINDINGS  Left Ventricle: Left ventricular ejection fraction, by estimation, is 55 to 60%. The left ventricle has normal function. The left ventricle has no regional wall motion abnormalities. The left ventricular internal cavity size was normal in size. There is  no left ventricular hypertrophy. Left ventricular diastolic parameters were normal. Right Ventricle: The right ventricular size is normal. No increase in right ventricular wall thickness. Right ventricular systolic function is normal. Left Atrium: Left atrial size was normal in size. Right Atrium: Right atrial size was normal in size. Pericardium: There is no evidence of pericardial effusion. Mitral Valve: The mitral valve is normal in structure. No evidence of mitral valve regurgitation. No evidence of mitral valve stenosis. Tricuspid Valve: The tricuspid valve is grossly normal. Tricuspid valve regurgitation is trivial. No evidence of tricuspid stenosis. Aortic Valve: The aortic valve is normal in structure. Aortic valve regurgitation is not visualized. No aortic stenosis is present. Pulmonic Valve: The pulmonic valve was normal in structure. Pulmonic valve regurgitation is not visualized. Aorta: The aortic root and ascending aorta are structurally normal, with no evidence of dilitation. IAS/Shunts:  The atrial septum is grossly normal.  LEFT VENTRICLE PLAX 2D LVIDd:         4.20 cm  Diastology LVIDs:         3.00 cm  LV e' lateral:   11.70  cm/s LV PW:         0.90 cm  LV E/e' lateral: 8.0 LV IVS:        0.80 cm  LV e' medial:    10.15 cm/s LVOT diam:     1.70 cm  LV E/e' medial:  9.3 LV SV:         45 LV SV Index:   24 LVOT Area:     2.27 cm  RIGHT VENTRICLE             IVC RV Basal diam:  2.20 cm     IVC diam: 1.40 cm RV S prime:     10.90 cm/s TAPSE (M-mode): 1.7 cm LEFT ATRIUM             Index       RIGHT ATRIUM          Index LA diam:        3.10 cm 1.64 cm/m  RA Area:     9.40 cm LA Vol (A2C):   57.5 ml 30.34 ml/m RA Volume:   16.40 ml 8.65 ml/m LA Vol (A4C):   21.8 ml 11.50 ml/m LA Biplane Vol: 35.5 ml 18.73 ml/m  AORTIC VALVE LVOT Vmax:   91.40 cm/s LVOT Vmean:  60.500 cm/s LVOT VTI:    0.197 m  AORTA Ao Root diam: 3.00 cm Ao Asc diam:  3.00 cm MITRAL VALVE MV Area (PHT): 4.49 cm    SHUNTS MV Decel Time: 169 msec    Systemic VTI:  0.20 m MV E velocity: 94.00 cm/s  Systemic Diam: 1.70 cm MV A velocity: 67.70 cm/s MV E/A ratio:  1.39 Mertie Moores MD Electronically signed by Mertie Moores MD Signature Date/Time: 02/07/2020/10:37:03 AM    Final    [place] AXILLARY NODE CORE BIOPSY LEFT  Addendum Date: 01/24/2020   ADDENDUM REPORT: 01/24/2020 13:48 ADDENDUM: Pathology revealed GRADE III INVASIVE DUCTAL CARCINOMA of the Left breast, 1 o'clock axis. Tumor infiltrating lymphocytes are noted. This was found to be concordant by Dr. Franki Cabot. Pathology revealed ONE LYMPH NODE, NEGATIVE FOR CARCINOMA of the Left breast, 2 o'clock axis. This was found to be discordant by Dr. Franki Cabot, with excision recommended. 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 This was found to be concordant by Dr. Franki Cabot. Pathology results were discussed with the patient by telephone by Kathrine Haddock, Bilingual Patient Services Representative. The patient reported doing  well after the biopsies with tenderness at the sites. Post biopsy instructions and care were reviewed and questions were answered. The patient was encouraged to call [facility] for any additional concerns. The patient was referred to [facility] on Jan 29, 2020. Recommendation for a bilateral breast MRI for further evaluation of extent of disease and age. Pathology results reported by Terie Purser, RN on 01/24/2020. Electronically Signed   By: Franki Cabot M.D.   On: 01/24/2020 13:48   Result Date: 01/24/2020 CLINICAL DATA:  Patient presents for ultrasound-guided biopsy of a highly suspicious mass in the LEFT breast at the 1 o'clock axis. Patient also presents for ultrasound-guided biopsy of a suspicious mass/lymph node in the LEFT breast at the 2 o'clock axis. Finally, patient presents for ultrasound-guided biopsy of an enlarged/morphologically abnormal lymph node in the LEFT axilla. EXAM: ULTRASOUND GUIDED LEFT BREAST CORE NEEDLE BIOPSY ULTRASOUND-GUIDED LEFT BREAST CORE NEEDLE BIOPSY ULTRASOUND-GUIDED LEFT AXILLA  CORE NEEDLE BIOPSY COMPARISON:  Previous exam(s). PROCEDURE: I met with the patient and we discussed the procedure of ultrasound-guided biopsy, including benefits and alternatives. We discussed the high likelihood of a successful procedure. We discussed the risks of the procedure, including infection, bleeding, tissue injury, clip migration, and inadequate sampling. Informed written consent was given. The usual time-out protocol was performed immediately prior to the procedure. Site 1: Lesion quadrant: Upper outer quadrant Using sterile technique and 1% Lidocaine as local anesthetic, under direct ultrasound visualization, a 12 gauge spring-loaded device was used to perform biopsy of the LEFT breast mass at the 1 o'clock axis using a lateral approach. At the conclusion of the procedure ribbon shaped  tissue marker clip was deployed into the biopsy cavity. Site 2: Lesion quadrant: Upper outer quadrant Using sterile technique and 1% Lidocaine as local anesthetic, under direct ultrasound visualization, a 14 gauge spring-loaded device was used to perform biopsy of the LEFT breast mass/lymph node at the 2 o'clock axis using a lateral approach. At the conclusion of the procedure coil shaped tissue marker clip was deployed into the biopsy cavity. Site 3: Lesion quadrant: Axilla Using sterile technique and 1% Lidocaine as local anesthetic, under direct ultrasound visualization, a 14 gauge spring-loaded device was used to perform biopsy of the enlarged lymph node in the LEFT axilla using a lateral approach. At the conclusion of the procedure Q shaped tissue marker clip was deployed into the biopsy cavity. Follow up 2 view mammogram was performed and dictated separately. IMPRESSION: 1. Ultrasound guided biopsy of the highly suspicious mass in the LEFT breast at the 1 o'clock axis. No apparent complications. 2. Ultrasound guided biopsy of the suspicious mass/lymph node in the LEFT breast at the 2 o'clock axis. No apparent complications. 3. Ultrasound guided biopsy of the enlarged lymph node in the LEFT axilla. No apparent complications. Electronically Signed: By: Franki Cabot M.D. On: 01/23/2020 15:38   MS DIGITAL DIAG TOMO BILAT  Result Date: 01/23/2020 CLINICAL DATA:  44 year old female presenting with a new lump in the left breast. EXAM: DIGITAL DIAGNOSTIC BILATERAL MAMMOGRAM WITH CAD AND TOMO ULTRASOUND LEFT BREAST COMPARISON:  None. ACR Breast Density Category b: There are scattered areas of fibroglandular density. FINDINGS: Mammogram: Right breast: No suspicious mass, distortion, or microcalcifications are identified to suggest presence of malignancy. Left breast: Spot compression tomosynthesis views were performed in addition to full field standard views. There is an irregular spiculated mass at the palpable  site of concern in the upper-outer quadrant of the left breast. The mass spans approximately 3.5 cm. Associated with the mass are several pleomorphic microcalcifications. In the far upper outer quadrant of the left breast there is an oval circumscribed mass measuring 0.9 cm, likely an intramammary lymph node. There are several enlarged left axillary lymph nodes. Mammographic images were processed with CAD. On physical exam, I palpate a fixed discrete mass at the palpable site of concern in the upper outer left breast. Ultrasound: Targeted ultrasound is performed in the left breast at 1 o'clock 5 cm from nipple demonstrating an irregular hypoechoic mass measuring 3.4 x 2.1 x 3.6 cm, which corresponds to the palpable abnormality. There is internal vascularity within the mass. Targeted ultrasound in the left breast at 2 o'clock 10 cm from the nipple demonstrates a round circumscribed hypoechoic mass with hilar flow measuring 0.8 x 0.8 x 0.8 cm, consistent with an intramammary lymph node. There is loss of the normal fatty hilum. Targeted ultrasound of the left axilla demonstrates at least  two abnormal lymph nodes with cortical thickening measuring up to 1.7 cm. IMPRESSION: 1. Palpable left breast mass with associated calcifications overall measuring 3.6 cm is highly suspicious. 2. Left breast mass at 2 o'clock 10 cm from nipple is concerning for an abnormal intramammary lymph node. 3.  Left axillary adenopathy (at least 2 lymph nodes). RECOMMENDATION: Ultrasound-guided core needle biopsy x 3 for the above findings. This will be performed same day. I have discussed the findings and recommendations with the patient. BI-RADS CATEGORY  5: Highly suggestive of malignancy. Electronically Signed   By: Audie Pinto M.D.   On: 01/23/2020 13:36   MM CLIP PLACEMENT LEFT  Result Date: 01/23/2020 CLINICAL DATA:  Status post 3 ultrasound-guided biopsies today. EXAM: DIAGNOSTIC LEFT MAMMOGRAM POST ULTRASOUND BIOPSY x3  COMPARISON:  Previous exam(s). FINDINGS: Mammographic images were obtained following ultrasound guided biopsies of masses in the LEFT breast at the 1 o'clock axis and 2 o'clock axis followed by ultrasound-guided biopsy of an enlarged lymph node in the LEFT axilla. All 3 biopsy marking clips are in expected position at the sites of biopsy. IMPRESSION: 1. Appropriate positioning of the ribbon shaped biopsy marking clip at the site of biopsy in the upper-outer quadrant of the LEFT breast corresponding to the mass at the 1 o'clock axis. 2. Appropriate positioning of the coil shaped biopsy marking clip at the site of biopsy in the upper-outer quadrant of the LEFT breast corresponding to the mass at the 2 o'clock axis. 3. Appropriate positioning of the Q shaped biopsy marking clip at the site of biopsy in the LEFT axilla. Final Assessment: Post Procedure Mammograms for Marker Placement Electronically Signed   By: Franki Cabot M.D.   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 Tumor infiltrating lymphocytes are noted. This was found to be concordant by Dr. Franki Cabot. Pathology revealed ONE LYMPH NODE, NEGATIVE FOR CARCINOMA of the Left breast, 2 o'clock axis. This was found to be discordant by Dr. Franki Cabot, with excision recommended. 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 This was found to be concordant by Dr. Franki Cabot. Pathology results were discussed with the patient by telephone by Kathrine Haddock, Bilingual Patient Services Representative. The patient reported doing well after the biopsies with tenderness at the sites. Post biopsy instructions and care were reviewed and questions were answered. The patient was encouraged to call [facility] for any additional concerns. The patient was referred to [facility] on Jan 29, 2020. Recommendation for a bilateral breast MRI for further evaluation of extent of disease and age. Pathology results reported by Terie Purser, RN on 01/24/2020. Electronically Signed   By: Franki Cabot M.D.   On: 01/24/2020 13:48   Result Date: 01/24/2020 CLINICAL DATA:  Patient presents for ultrasound-guided biopsy of a highly suspicious mass in the LEFT breast at the 1 o'clock axis. Patient also presents for ultrasound-guided biopsy of a suspicious mass/lymph node in the LEFT breast at the 2 o'clock axis. Finally, patient presents for ultrasound-guided biopsy of an enlarged/morphologically abnormal lymph node in the LEFT axilla. EXAM: ULTRASOUND GUIDED LEFT BREAST CORE NEEDLE BIOPSY ULTRASOUND-GUIDED LEFT BREAST CORE NEEDLE BIOPSY ULTRASOUND-GUIDED LEFT AXILLA CORE  NEEDLE BIOPSY COMPARISON:  Previous exam(s). PROCEDURE: I met with the patient and we discussed the procedure of ultrasound-guided biopsy, including benefits and alternatives. We discussed the high likelihood of a successful procedure. We discussed the risks of the procedure, including infection, bleeding, tissue injury, clip migration, and inadequate sampling. Informed written consent was given. The usual time-out protocol was performed immediately prior to the procedure. Site 1: Lesion quadrant: Upper outer quadrant Using sterile technique and 1% Lidocaine as local anesthetic, under direct ultrasound visualization, a 12 gauge spring-loaded device was used to perform biopsy of the LEFT breast mass at the 1 o'clock axis using a lateral approach. At the conclusion of the procedure ribbon shaped tissue marker clip was deployed into the biopsy cavity. Site 2: Lesion quadrant: Upper outer quadrant Using sterile technique and 1% Lidocaine as local anesthetic, under direct ultrasound  visualization, a 14 gauge spring-loaded device was used to perform biopsy of the LEFT breast mass/lymph node at the 2 o'clock axis using a lateral approach. At the conclusion of the procedure coil shaped tissue marker clip was deployed into the biopsy cavity. Site 3: Lesion quadrant: Axilla Using sterile technique and 1% Lidocaine as local anesthetic, under direct ultrasound visualization, a 14 gauge spring-loaded device was used to perform biopsy of the enlarged lymph node in the LEFT axilla using a lateral approach. At the conclusion of the procedure Q shaped tissue marker clip was deployed into the biopsy cavity. Follow up 2 view mammogram was performed and dictated separately. IMPRESSION: 1. Ultrasound guided biopsy of the highly suspicious mass in the LEFT breast at the 1 o'clock axis. No apparent complications. 2. Ultrasound guided biopsy of the suspicious mass/lymph node in the LEFT breast at the 2 o'clock axis. No apparent complications. 3. Ultrasound guided biopsy of the enlarged lymph node in the LEFT axilla. No apparent complications. Electronically Signed: By: Franki Cabot M.D. 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 Tumor infiltrating lymphocytes are noted. This was found to be concordant by Dr. Franki Cabot. Pathology revealed ONE LYMPH NODE, NEGATIVE FOR CARCINOMA of the Left breast, 2 o'clock axis. This was found to be discordant by Dr. Franki Cabot, with excision recommended. 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 This was found to be concordant by Dr. Franki Cabot. Pathology results were discussed with the patient by telephone by Kathrine Haddock, Bilingual Patient Services Representative. The patient reported doing well after the biopsies  with tenderness at the sites. Post biopsy instructions and care were reviewed and questions were answered. The patient was encouraged to call [facility] for any additional concerns. The patient was referred to [facility] at [facility] on Jan 29, 2020. Recommendation for a bilateral breast MRI for further evaluation of extent of disease and age. Pathology results reported by Terie Purser, RN on 01/24/2020. Electronically Signed   By: Franki Cabot M.D.   On: 01/24/2020 13:48   Result Date: 01/24/2020 CLINICAL DATA:  Patient presents for ultrasound-guided biopsy of a highly suspicious mass in the LEFT breast at the 1 o'clock axis. Patient also presents for ultrasound-guided biopsy of a suspicious mass/lymph node in the LEFT breast at the 2 o'clock axis. Finally,  patient presents for ultrasound-guided biopsy of an enlarged/morphologically abnormal lymph node in the LEFT axilla. EXAM: ULTRASOUND GUIDED LEFT BREAST CORE NEEDLE BIOPSY ULTRASOUND-GUIDED LEFT BREAST CORE NEEDLE BIOPSY ULTRASOUND-GUIDED LEFT AXILLA CORE NEEDLE BIOPSY COMPARISON:  Previous exam(s). PROCEDURE: I met with the patient and we discussed the procedure of ultrasound-guided biopsy, including benefits and alternatives. We discussed the high likelihood of a successful procedure. We discussed the risks of the procedure, including infection, bleeding, tissue injury, clip migration, and inadequate sampling. Informed written consent was given. The usual time-out protocol was performed immediately prior to the procedure. Site 1: Lesion quadrant: Upper outer quadrant Using sterile technique and 1% Lidocaine as local anesthetic, under direct ultrasound visualization, a 12 gauge spring-loaded device was used to perform biopsy of the LEFT breast mass at the 1 o'clock axis using a lateral approach. At the conclusion of the procedure ribbon shaped tissue marker clip was  deployed into the biopsy cavity. Site 2: Lesion quadrant: Upper outer quadrant Using sterile technique and 1% Lidocaine as local anesthetic, under direct ultrasound visualization, a 14 gauge spring-loaded device was used to perform biopsy of the LEFT breast mass/lymph node at the 2 o'clock axis using a lateral approach. At the conclusion of the procedure coil shaped tissue marker clip was deployed into the biopsy cavity. Site 3: Lesion quadrant: Axilla Using sterile technique and 1% Lidocaine as local anesthetic, under direct ultrasound visualization, a 14 gauge spring-loaded device was used to perform biopsy of the enlarged lymph node in the LEFT axilla using a lateral approach. At the conclusion of the procedure Q shaped tissue marker clip was deployed into the biopsy cavity. Follow up 2 view mammogram was performed and dictated separately. IMPRESSION: 1. Ultrasound guided biopsy of the highly suspicious mass in the LEFT breast at the 1 o'clock axis. No apparent complications. 2. Ultrasound guided biopsy of the suspicious mass/lymph node in the LEFT breast at the 2 o'clock axis. No apparent complications. 3. Ultrasound guided biopsy of the enlarged lymph node in the LEFT axilla. No apparent complications. Electronically Signed: By: Franki Cabot M.D. On: 01/23/2020 15:38     ELIGIBLE FOR AVAILABLE RESEARCH PROTOCOL: [id number]?  ASSESSMENT: 44 y.o. 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  (1) neoadjuvant chemotherapy will consist of doxorubicin and cyclophosphamide in dose dense fashion x4 starting 02/12/2020 to be followed by paclitaxel and carboplatin weekly x12  (2) definitive surgery to follow  (3) adjuvant radiation  (4) genetics testing  PLAN: I put in all the prescriptions for supportive medicines to [facility] which is their preferred  pharmacy and let them know that if the cost is prohibitive they should let us know so we can obtain these for them.  The numbing cream is usually the most expensive and is really not necessary as we can use ice but if they can obtain it would be fine.  She will not needed the first time as she will have her port placed 02/11/2020 and they will leave the needle in place  I gave her the routing sheet and went over it in detail.  Of course it is in [place] but I translated for them and particularly the husband Celfin understands it very well.  They are also going to meet with our chemotherapy teaching nurse after the patient is done with her bone scan today to go over side effects in  even more detail and to take a tour of the treatment area  I wrote down for her the appointments on [phone] and added an appointment [phone] so I can see her a week after treatment.  I will also request that they let me know when she is in the treatment area so I can pop in and make sure everything is in place.  The concern of course is the language barrier.  I encouraged them to call us with any problems and I gave them a direct number to our pod  Total encounter time 30 minutes.Sarajane Jews C. Kollin Udell, MD 02/07/2020 10:46 AM Medical Oncology and Hematology [facility] [place], [place] [address] Tel. [phone]    Fax. [phone]   This document serves as a record of services personally performed by Lurline Del, MD. It was created on his behalf by Wilburn Mylar, a trained medical scribe. The creation of this record is based on the scribe's personal observations and the provider's statements to them.   I, Lurline Del MD, have reviewed the above documentation for accuracy and completeness, and I agree with the above.   *Total Encounter Time as defined by the Centers for Medicare and Medicaid Services includes, in addition to the face-to-face time of a patient visit (documented in  the note above) non-face-to-face time: obtaining and reviewing outside history, ordering and reviewing medications, tests or procedures, care coordination (communications with other health care professionals or caregivers) and documentation in the medical record.

## 2020-02-07 ENCOUNTER — Inpatient Hospital Stay (HOSPITAL_BASED_OUTPATIENT_CLINIC_OR_DEPARTMENT_OTHER): Payer: Self-pay | Admitting: Oncology

## 2020-02-07 ENCOUNTER — Other Ambulatory Visit (HOSPITAL_COMMUNITY)
Admission: RE | Admit: 2020-02-07 | Discharge: 2020-02-07 | Disposition: A | Discharge status: Home / Self Care | Payer: Self-pay | Source: Ambulatory Visit | Attending: General Surgery | Admitting: General Surgery

## 2020-02-07 ENCOUNTER — Other Ambulatory Visit: Payer: Self-pay

## 2020-02-07 ENCOUNTER — Encounter (HOSPITAL_COMMUNITY)
Admission: RE | Admit: 2020-02-07 | Discharge: 2020-02-07 | Disposition: A | Discharge status: Home / Self Care | Payer: Self-pay | Source: Ambulatory Visit | Attending: Oncology | Admitting: Oncology

## 2020-02-07 ENCOUNTER — Inpatient Hospital Stay: Payer: Self-pay

## 2020-02-07 ENCOUNTER — Ambulatory Visit (HOSPITAL_COMMUNITY)
Admission: RE | Admit: 2020-02-07 | Discharge: 2020-02-07 | Disposition: A | Discharge status: Home / Self Care | Payer: No Typology Code available for payment source | Source: Ambulatory Visit | Attending: Oncology | Admitting: Oncology

## 2020-02-07 ENCOUNTER — Encounter (HOSPITAL_COMMUNITY)
Admission: RE | Admit: 2020-02-07 | Discharge: 2020-02-07 | Disposition: A | Discharge status: Home / Self Care | Payer: No Typology Code available for payment source | Source: Ambulatory Visit | Attending: Oncology | Admitting: Oncology

## 2020-02-07 VITALS — BP 116/58 | HR 80 | Temp 98.5°F | Resp 17 | Ht 62.0 in | Wt 193.4 lb

## 2020-02-07 DIAGNOSIS — M898X9 Other specified disorders of bone, unspecified site: Secondary | ICD-10-CM | POA: Insufficient documentation

## 2020-02-07 DIAGNOSIS — Z170 Estrogen receptor positive status [ER+]: Secondary | ICD-10-CM

## 2020-02-07 DIAGNOSIS — Z0189 Encounter for other specified special examinations: Secondary | ICD-10-CM

## 2020-02-07 DIAGNOSIS — Z20822 Contact with and (suspected) exposure to covid-19: Secondary | ICD-10-CM | POA: Insufficient documentation

## 2020-02-07 DIAGNOSIS — Z01818 Encounter for other preprocedural examination: Secondary | ICD-10-CM | POA: Insufficient documentation

## 2020-02-07 DIAGNOSIS — C50412 Malignant neoplasm of upper-outer quadrant of left female breast: Secondary | ICD-10-CM

## 2020-02-07 MED ORDER — TECHNETIUM TC 99M MEDRONATE IV KIT
21.6000 | PACK | Freq: Once | INTRAVENOUS | Status: AC | PRN
  Administered 2020-02-07: 21.6 via INTRAVENOUS

## 2020-02-07 MED ORDER — ENSURE PRE-SURGERY PO LIQD: 296.0000 mL | Freq: Once | ORAL | Status: DC

## 2020-02-07 MED ORDER — SODIUM CHLORIDE (PF) 0.9 % IJ SOLN
INTRAMUSCULAR | Status: AC
  Filled 2020-02-07: qty 50

## 2020-02-07 MED ORDER — IOHEXOL 300 MG/ML  SOLN
75.0000 mL | Freq: Once | INTRAMUSCULAR | Status: AC | PRN
  Administered 2020-02-07: 75 mL via INTRAVENOUS

## 2020-02-07 NOTE — Progress Notes (Signed)
  Echocardiogram 2D Echocardiogram has been performed.  [name] 02/07/2020, 10:07 AM

## 2020-02-07 NOTE — Progress Notes (Signed)

## 2020-02-07 NOTE — Progress Notes (Signed)
Left message for pt to come pick up soap and pre surgery drink.

## 2020-02-08 LAB — SARS CORONAVIRUS 2 (TAT 6-24 HRS): SARS Coronavirus 2: NEGATIVE

## 2020-02-10 NOTE — Anesthesia Preprocedure Evaluation (Addendum)
Anesthesia Evaluation  Patient identified by MRN, date of birth, ID band Patient awake    Reviewed: Allergy & Precautions, NPO status , Patient's Chart, lab work & pertinent test results  Airway Mallampati: II  TM Distance: >3 FB Neck ROM: Full    Dental no notable dental hx. (+) Teeth Intact, Dental Advisory Given   Pulmonary asthma ,    Pulmonary exam normal breath sounds clear to auscultation       Cardiovascular negative cardio ROS Normal cardiovascular exam Rhythm:Regular Rate:Normal     Neuro/Psych  Headaches,    GI/Hepatic negative GI ROS, Neg liver ROS,   Endo/Other  negative endocrine ROS  Renal/GU negative Renal ROS     Musculoskeletal negative musculoskeletal ROS (+)   Abdominal (+) + obese,   Peds  Hematology negative hematology ROS (+)   Anesthesia Other Findings   Reproductive/Obstetrics                            Anesthesia Physical Anesthesia Plan  ASA: III  Anesthesia Plan: General   Post-op Pain Management:    Induction: Intravenous  PONV Risk Score and Plan: 3 and Treatment may vary due to age or medical condition, Ondansetron and Dexamethasone  Airway Management Planned: LMA  Additional Equipment: None  Intra-op Plan:   Post-operative Plan: Extubation in OR  Informed Consent: I have reviewed the patients History and Physical, chart, labs and discussed the procedure including the risks, benefits and alternatives for the proposed anesthesia with the patient or authorized representative who has indicated his/her understanding and acceptance.     Dental advisory given  Plan Discussed with:   Anesthesia Plan Comments:        Anesthesia Quick Evaluation

## 2020-02-11 ENCOUNTER — Ambulatory Visit (HOSPITAL_BASED_OUTPATIENT_CLINIC_OR_DEPARTMENT_OTHER): Payer: No Typology Code available for payment source | Admitting: Anesthesiology

## 2020-02-11 ENCOUNTER — Telehealth: Payer: Self-pay | Admitting: Oncology

## 2020-02-11 ENCOUNTER — Encounter (HOSPITAL_BASED_OUTPATIENT_CLINIC_OR_DEPARTMENT_OTHER)
Admission: RE | Disposition: A | Discharge status: Home / Self Care | Payer: Self-pay | Source: Home / Self Care | Attending: General Surgery

## 2020-02-11 ENCOUNTER — Encounter (HOSPITAL_BASED_OUTPATIENT_CLINIC_OR_DEPARTMENT_OTHER): Payer: Self-pay | Admitting: General Surgery

## 2020-02-11 ENCOUNTER — Ambulatory Visit (HOSPITAL_COMMUNITY): Payer: No Typology Code available for payment source

## 2020-02-11 ENCOUNTER — Other Ambulatory Visit: Payer: Self-pay | Admitting: Oncology

## 2020-02-11 ENCOUNTER — Other Ambulatory Visit: Payer: Self-pay | Admitting: *Deleted

## 2020-02-11 ENCOUNTER — Encounter: Payer: Self-pay | Admitting: *Deleted

## 2020-02-11 ENCOUNTER — Ambulatory Visit (HOSPITAL_BASED_OUTPATIENT_CLINIC_OR_DEPARTMENT_OTHER)
Admission: RE | Admit: 2020-02-11 | Discharge: 2020-02-11 | Disposition: A | Discharge status: Home / Self Care | Payer: No Typology Code available for payment source | Attending: General Surgery | Admitting: General Surgery

## 2020-02-11 ENCOUNTER — Other Ambulatory Visit: Payer: Self-pay

## 2020-02-11 ENCOUNTER — Telehealth: Payer: Self-pay | Admitting: Licensed Clinical Social Worker

## 2020-02-11 DIAGNOSIS — Z95828 Presence of other vascular implants and grafts: Secondary | ICD-10-CM

## 2020-02-11 DIAGNOSIS — Z419 Encounter for procedure for purposes other than remedying health state, unspecified: Secondary | ICD-10-CM

## 2020-02-11 DIAGNOSIS — J45909 Unspecified asthma, uncomplicated: Secondary | ICD-10-CM | POA: Insufficient documentation

## 2020-02-11 DIAGNOSIS — C50412 Malignant neoplasm of upper-outer quadrant of left female breast: Secondary | ICD-10-CM

## 2020-02-11 DIAGNOSIS — Z170 Estrogen receptor positive status [ER+]: Secondary | ICD-10-CM | POA: Insufficient documentation

## 2020-02-11 DIAGNOSIS — C773 Secondary and unspecified malignant neoplasm of axilla and upper limb lymph nodes: Secondary | ICD-10-CM | POA: Insufficient documentation

## 2020-02-11 HISTORY — PX: PORTACATH PLACEMENT: SHX2246

## 2020-02-11 SURGERY — INSERTION, TUNNELED CENTRAL VENOUS DEVICE, WITH PORT
Anesthesia: General | Site: Chest | Laterality: Right

## 2020-02-11 MED ORDER — MIDAZOLAM HCL 2 MG/2ML IJ SOLN
INTRAMUSCULAR | Status: AC
  Filled 2020-02-11: qty 2

## 2020-02-11 MED ORDER — HEPARIN SOD (PORK) LOCK FLUSH 100 UNIT/ML IV SOLN
INTRAVENOUS | Status: DC | PRN
  Administered 2020-02-11: 400 [IU] via INTRAVENOUS

## 2020-02-11 MED ORDER — HEPARIN (PORCINE) IN NACL 1000-0.9 UT/500ML-% IV SOLN
INTRAVENOUS | Status: AC
  Filled 2020-02-11: qty 500

## 2020-02-11 MED ORDER — OXYCODONE HCL 5 MG PO TABS: 5.0000 mg | ORAL_TABLET | Freq: Once | ORAL | Status: DC | PRN

## 2020-02-11 MED ORDER — MIDAZOLAM HCL 5 MG/5ML IJ SOLN
INTRAMUSCULAR | Status: DC | PRN
  Administered 2020-02-11: 2 mg via INTRAVENOUS

## 2020-02-11 MED ORDER — CEFAZOLIN SODIUM-DEXTROSE 2-4 GM/100ML-% IV SOLN
2.0000 g | INTRAVENOUS | Status: AC
  Administered 2020-02-11: 2 g via INTRAVENOUS

## 2020-02-11 MED ORDER — BUPIVACAINE HCL (PF) 0.25 % IJ SOLN
INTRAMUSCULAR | Status: AC
  Filled 2020-02-11: qty 30

## 2020-02-11 MED ORDER — HEPARIN (PORCINE) IN NACL 2-0.9 UNITS/ML
INTRAMUSCULAR | Status: AC | PRN
  Administered 2020-02-11: 1 via INTRAVENOUS

## 2020-02-11 MED ORDER — DEXAMETHASONE 4 MG PO TABS: ORAL_TABLET | ORAL | 1 refills | Status: DC

## 2020-02-11 MED ORDER — FENTANYL CITRATE (PF) 100 MCG/2ML IJ SOLN
25.0000 ug | INTRAMUSCULAR | Status: DC | PRN
  Administered 2020-02-11: 25 ug via INTRAVENOUS

## 2020-02-11 MED ORDER — ONDANSETRON HCL 4 MG/2ML IJ SOLN
INTRAMUSCULAR | Status: AC
  Filled 2020-02-11: qty 2

## 2020-02-11 MED ORDER — HEPARIN SOD (PORK) LOCK FLUSH 100 UNIT/ML IV SOLN
INTRAVENOUS | Status: AC
  Filled 2020-02-11: qty 5

## 2020-02-11 MED ORDER — LIDOCAINE 2% (20 MG/ML) 5 ML SYRINGE
INTRAMUSCULAR | Status: AC
  Filled 2020-02-11: qty 5

## 2020-02-11 MED ORDER — LACTATED RINGERS IV SOLN: INTRAVENOUS | Status: DC

## 2020-02-11 MED ORDER — FENTANYL CITRATE (PF) 100 MCG/2ML IJ SOLN
INTRAMUSCULAR | Status: AC
  Filled 2020-02-11: qty 2

## 2020-02-11 MED ORDER — DEXAMETHASONE SODIUM PHOSPHATE 10 MG/ML IJ SOLN
INTRAMUSCULAR | Status: AC
  Filled 2020-02-11: qty 1

## 2020-02-11 MED ORDER — BUPIVACAINE HCL (PF) 0.25 % IJ SOLN
INTRAMUSCULAR | Status: DC | PRN
  Administered 2020-02-11: 6 mL

## 2020-02-11 MED ORDER — DEXAMETHASONE SODIUM PHOSPHATE 4 MG/ML IJ SOLN
INTRAMUSCULAR | Status: DC | PRN
  Administered 2020-02-11: 10 mg via INTRAVENOUS

## 2020-02-11 MED ORDER — ACETAMINOPHEN 500 MG PO TABS
1000.0000 mg | ORAL_TABLET | ORAL | Status: AC
  Administered 2020-02-11: 1000 mg via ORAL

## 2020-02-11 MED ORDER — OXYCODONE HCL 5 MG PO TABS: 5.0000 mg | ORAL_TABLET | Freq: Four times a day (QID) | ORAL | 0 refills | Status: DC | PRN

## 2020-02-11 MED ORDER — PHENYLEPHRINE 40 MCG/ML (10ML) SYRINGE FOR IV PUSH (FOR BLOOD PRESSURE SUPPORT)
PREFILLED_SYRINGE | INTRAVENOUS | Status: DC | PRN
  Administered 2020-02-11 (×2): 80 ug via INTRAVENOUS

## 2020-02-11 MED ORDER — CEFAZOLIN SODIUM-DEXTROSE 2-4 GM/100ML-% IV SOLN
INTRAVENOUS | Status: AC
  Filled 2020-02-11: qty 100

## 2020-02-11 MED ORDER — ACETAMINOPHEN 500 MG PO TABS
ORAL_TABLET | ORAL | Status: AC
  Filled 2020-02-11: qty 2

## 2020-02-11 MED ORDER — OXYCODONE HCL 5 MG/5ML PO SOLN: 5.0000 mg | Freq: Once | ORAL | Status: DC | PRN

## 2020-02-11 MED ORDER — ONDANSETRON HCL 4 MG/2ML IJ SOLN: 4.0000 mg | Freq: Once | INTRAMUSCULAR | Status: DC | PRN

## 2020-02-11 MED ORDER — PROPOFOL 10 MG/ML IV BOLUS
INTRAVENOUS | Status: DC | PRN
  Administered 2020-02-11: 150 mg via INTRAVENOUS

## 2020-02-11 MED ORDER — PROPOFOL 10 MG/ML IV BOLUS
INTRAVENOUS | Status: AC
  Filled 2020-02-11: qty 20

## 2020-02-11 MED ORDER — LIDOCAINE 2% (20 MG/ML) 5 ML SYRINGE
INTRAMUSCULAR | Status: DC | PRN
  Administered 2020-02-11: 80 mg via INTRAVENOUS

## 2020-02-11 MED ORDER — ACETAMINOPHEN 10 MG/ML IV SOLN: 1000.0000 mg | Freq: Once | INTRAVENOUS | Status: DC | PRN

## 2020-02-11 MED ORDER — FENTANYL CITRATE (PF) 100 MCG/2ML IJ SOLN
INTRAMUSCULAR | Status: DC | PRN
  Administered 2020-02-11: 50 ug via INTRAVENOUS

## 2020-02-11 SURGICAL SUPPLY — 54 items
ADH SKN CLS APL DERMABOND .7 (GAUZE/BANDAGES/DRESSINGS) ×1
APL PRP STRL LF DISP 70% ISPRP (MISCELLANEOUS) ×1
APL SKNCLS STERI-STRIP NONHPOA (GAUZE/BANDAGES/DRESSINGS)
BAG DECANTER FOR FLEXI CONT (MISCELLANEOUS) ×3 IMPLANT
BENZOIN TINCTURE PRP APPL 2/3 (GAUZE/BANDAGES/DRESSINGS) ×1 IMPLANT
BLADE SURG 11 STRL SS (BLADE) ×3 IMPLANT
BLADE SURG 15 STRL LF DISP TIS (BLADE) ×1 IMPLANT
BLADE SURG 15 STRL SS (BLADE) ×3
CANISTER SUCT 1200ML W/VALVE (MISCELLANEOUS) IMPLANT
CHLORAPREP W/TINT 26 (MISCELLANEOUS) ×3 IMPLANT
CLOSURE WOUND 1/2 X4 (GAUZE/BANDAGES/DRESSINGS) ×1
COVER BACK TABLE 60X90IN (DRAPES) ×3 IMPLANT
COVER MAYO STAND STRL (DRAPES) ×3 IMPLANT
COVER PROBE 5X48 (MISCELLANEOUS)
COVER WAND RF STERILE (DRAPES) IMPLANT
DECANTER SPIKE VIAL GLASS SM (MISCELLANEOUS) IMPLANT
DERMABOND ADVANCED (GAUZE/BANDAGES/DRESSINGS) ×2
DERMABOND ADVANCED .7 DNX12 (GAUZE/BANDAGES/DRESSINGS) ×1 IMPLANT
DRAPE C-ARM 42X72 X-RAY (DRAPES) ×3 IMPLANT
DRAPE LAPAROSCOPIC ABDOMINAL (DRAPES) ×3 IMPLANT
DRAPE UTILITY XL STRL (DRAPES) ×3 IMPLANT
DRSG TEGADERM 4X4.75 (GAUZE/BANDAGES/DRESSINGS) ×4 IMPLANT
ELECT COATED BLADE 2.86 ST (ELECTRODE) ×3 IMPLANT
ELECT REM PT RETURN 9FT ADLT (ELECTROSURGICAL) ×3
ELECTRODE REM PT RTRN 9FT ADLT (ELECTROSURGICAL) ×1 IMPLANT
GAUZE SPONGE 4X4 12PLY STRL LF (GAUZE/BANDAGES/DRESSINGS) ×3 IMPLANT
GLOVE BIO SURGEON STRL SZ7 (GLOVE) ×3 IMPLANT
GLOVE BIOGEL PI IND STRL 7.5 (GLOVE) ×1 IMPLANT
GLOVE BIOGEL PI INDICATOR 7.5 (GLOVE) ×2
GOWN STRL REUS W/ TWL LRG LVL3 (GOWN DISPOSABLE) ×2 IMPLANT
GOWN STRL REUS W/TWL LRG LVL3 (GOWN DISPOSABLE) ×6
IV KIT MINILOC 20X1 SAFETY (NEEDLE) IMPLANT
KIT CVR 48X5XPRB PLUP LF (MISCELLANEOUS) IMPLANT
KIT PORT POWER 8FR ISP CVUE (Port) ×2 IMPLANT
NDL HYPO 25X1 1.5 SAFETY (NEEDLE) ×1 IMPLANT
NDL SAFETY ECLIPSE 18X1.5 (NEEDLE) IMPLANT
NEEDLE HYPO 18GX1.5 SHARP (NEEDLE)
NEEDLE HYPO 25X1 1.5 SAFETY (NEEDLE) ×3 IMPLANT
PENCIL SMOKE EVACUATOR (MISCELLANEOUS) ×3 IMPLANT
SET BASIN DAY SURGERY F.S. (CUSTOM PROCEDURE TRAY) ×3 IMPLANT
SLEEVE SCD COMPRESS KNEE MED (MISCELLANEOUS) ×3 IMPLANT
STRIP CLOSURE SKIN 1/2X4 (GAUZE/BANDAGES/DRESSINGS) ×2 IMPLANT
SUT MNCRL AB 4-0 PS2 18 (SUTURE) ×3 IMPLANT
SUT PROLENE 2 0 SH DA (SUTURE) ×3 IMPLANT
SUT SILK 2 0 TIES 17X18 (SUTURE)
SUT SILK 2-0 18XBRD TIE BLK (SUTURE) IMPLANT
SUT VIC AB 3-0 SH 27 (SUTURE) ×3
SUT VIC AB 3-0 SH 27X BRD (SUTURE) ×1 IMPLANT
SYR 5ML LUER SLIP (SYRINGE) ×3 IMPLANT
SYR CONTROL 10ML LL (SYRINGE) ×3 IMPLANT
TOWEL GREEN STERILE FF (TOWEL DISPOSABLE) ×3 IMPLANT
TUBE CONNECTING 20'X1/4 (TUBING)
TUBE CONNECTING 20X1/4 (TUBING) IMPLANT
YANKAUER SUCT BULB TIP NO VENT (SUCTIONS) IMPLANT

## 2020-02-11 NOTE — Anesthesia Procedure Notes (Signed)
Procedure Name: LMA Insertion Date/Time: 02/11/2020 1:00 PM Performed by: Maryella Shivers, CRNA Pre-anesthesia Checklist: Patient identified, Emergency Drugs available, Suction available and Patient being monitored Patient Re-evaluated:Patient Re-evaluated prior to induction Oxygen Delivery Method: Circle system utilized Preoxygenation: Pre-oxygenation with 100% oxygen Induction Type: IV induction Ventilation: Mask ventilation without difficulty LMA: LMA inserted LMA Size: 4.0 Number of attempts: 1 Airway Equipment and Method: Bite block Placement Confirmation: positive ETCO2 Tube secured with: Tape Dental Injury: Teeth and Oropharynx as per pre-operative assessment

## 2020-02-11 NOTE — Anesthesia Postprocedure Evaluation (Signed)
Anesthesia Post Note  Patient: [name]  Procedure(s) Performed: INSERTION PORT-A-CATH WITH ULTRASOUND GUIDANCE (Right Chest)     Patient location during evaluation: PACU Anesthesia Type: General Level of consciousness: awake and alert Pain management: pain level controlled Vital Signs Assessment: post-procedure vital signs reviewed and stable Respiratory status: spontaneous breathing, nonlabored ventilation, respiratory function stable and patient connected to nasal cannula oxygen Cardiovascular status: blood pressure returned to baseline and stable Postop Assessment: no apparent nausea or vomiting Anesthetic complications: no    Last Vitals:  Vitals:   02/11/20 1415 02/11/20 1425  BP: 111/75 114/73  Pulse: 65 73  Resp: 15 17  Temp:    SpO2: 100% 100%    Last Pain:  Vitals:   02/11/20 1415  TempSrc:   PainSc: 2                  Barnet Glasgow

## 2020-02-11 NOTE — H&P (Signed)
  37 yof who I interviewed with help of [facility] Spanish interpreter. she noted a left breast mass in February. no discharge. no prior breast history. no family history of breast or ovarian cancer. she underwent evaluation with mm/us. this shows a 3.6x3.4x2.1 cm breast mass laterally with a 8 mm IM Node. there are at least 2 abnormal axillary nodes. the biopsy of the IM node is benign but discordant. the ax node biopsy is positive. 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 she is here to discuss options. they are from [place]. Husband works in Sports administrator. they have three daughters 12/10/4.    Past Surgical History Rolm Bookbinder, MD; 01/29/2020 3:58 PM) Cesarean Section - 3 or more   Medication History Anjenette Jagoe, RN; 01/29/2020 8:19 AM) Medications Reconciled  Social History Rolm Bookbinder, MD; 01/29/2020 3:58 PM) Current tobacco use  Never smoker.  Family History Rolm Bookbinder, MD; 01/29/2020 3:58 PM) No pertinent family history     Review of Systems Rolm Bookbinder MD; 01/29/2020 3:58 PM) All other systems negative   Physical Exam Rolm Bookbinder MD; 01/29/2020 3:57 PM) General Mental Status-Alert. Orientation-Oriented X3.  Breast Nipples-No Discharge. Note: 3-4 cm lateral left breast mass with hematoma associated with it   Lymphatic Head & Neck  General Head & Neck Lymphatics: Bilateral - Description - Normal. Axillary  General Axillary Region: Bilateral - Description - Normal. Note: no [place] adenopathy     Assessment & Plan Rolm Bookbinder MD; 01/29/2020 4:00 PM) BREAST CANCER METASTASIZED TO AXILLARY LYMPH NODE, LEFT (C50.912) Story: MRI, genetics, port for primary chemotherapy followed by surgery we discussed via interpreter treatments for breast cancer. essentially this is triple negative and I think primary chemotherapy indicated. discussed rationale for primary chemotherapy. we  discussed port placement discussed mri now and at end of chemotherapy. pending genetics may very well be candidate for lumpectomy (needs excision of im node also) and TAD pending response to chemotherapy.

## 2020-02-11 NOTE — Op Note (Signed)
Preoperative diagnosis:clinical stage IIleftbreast cancer Postoperative diagnosis: saa Procedure:Right IJport placement withUSguidance Surgeon: Dr Serita Grammes EBL: minimal Anesthesia: general  Complications none Drains none Specimens:none Sponge and needle count correct times two dispo to recovery stable  Indications: 6 yof who I interviewed with help of [facility] Spanish interpreter. she noted a left breast mass in February. no discharge. no prior breast history. no family history of breast or ovarian cancer. she underwent evaluation with mm/us. this shows a 3.6x3.4x2.1 cm breast mass laterally with a 8 mm IM Node. there are at least 2 abnormal axillary nodes. the biopsy of the IM node is benign but discordant. the ax node biopsy is positive. 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 We discussed port placement for primary systemic therapy  Procedure:After informed consent was obtained the patient was taken to the operating room. She was given antibiotics. SCDs were placed. She was placed under general anesthesia without complication. She was prepped and draped in the standard sterile surgical fashion. A surgical timeout was then performed.  I identified the internal jugular vein on therightside with the ultrasound. I made a small nick in the skin. I accessed the internal jugular vein with the needle under ultrasound guidance. I passed the wire.  The wire was confirmed to be in position with fluoroscopy.The wire was in the vein by ultrasound as well.I then infiltrated Marcaine below the clavicle. Imade anincision and developed a subcutaneous pocket for the port. I then tunneled between the port site as well as the insertion site. I brought the line through this. I then placed the dilator under fluoroscopic guidance over the wire. I removedthe wire andthe dilator. I then placed the line into the sheath. The  sheath was then removed. I pulled the line back to be in the distal vena cava I then hooked this up to the port. This was placed in the pocket and sutured in place with 2-0 Prolene suture. I then closed this with 3-0 Vicryl and 4-0 Monocryl. Glue was placed.I accessed the port and left the needle in place for chemotherapy tomorrow.It withdrew blood and flushed easily. I packed it with heparin.I placed a dressing on it as well.

## 2020-02-11 NOTE — Progress Notes (Signed)
I called [name] and left her phone message letting her know the results of her scans which are very favorable

## 2020-02-11 NOTE — Interval H&P Note (Signed)
History and Physical Interval Note:  02/11/2020 12:27 PM  [name]  has presented today for surgery, with the diagnosis of LEFT BREAST CANCER.  The various methods of treatment have been discussed with the patient and family. After consideration of risks, benefits and other options for treatment, the patient has consented to  Procedure(s): INSERTION PORT-A-CATH WITH ULTRASOUND GUIDANCE (N/A) as a surgical intervention.  The patient's history has been reviewed, patient examined, no change in status, stable for surgery.  I have reviewed the patient's chart and labs.  Questions were answered to the patient's satisfaction.     Rolm Bookbinder

## 2020-02-11 NOTE — OR Nursing (Signed)
Blistering/bruising noted from drape adhesive (laparoscopic abdominal drape) right upper edge of chest above port insertion site.  Dr Donne Hazel notified.

## 2020-02-11 NOTE — Transfer of Care (Signed)
Immediate Anesthesia Transfer of Care Note  Patient: [name]  Procedure(s) Performed: INSERTION PORT-A-CATH WITH ULTRASOUND GUIDANCE (Right Chest)  Patient Location: PACU  Anesthesia Type:General  Level of Consciousness: awake  Airway & Oxygen Therapy: Patient Spontanous Breathing and Patient connected to face mask oxygen  Post-op Assessment: Report given to RN and Post -op Vital signs reviewed and stable  Post vital signs: Reviewed and stable  Last Vitals:  Vitals Value Taken Time  BP 107/66 02/11/20 1339  Temp    Pulse 91 02/11/20 1340  Resp 18 02/11/20 1340  SpO2 100 % 02/11/20 1340  Vitals shown include unvalidated device data.  Last Pain:  Vitals:   02/11/20 1048  TempSrc: Oral  PainSc: 0-No pain         Complications: No apparent anesthesia complications

## 2020-02-11 NOTE — Discharge Instructions (Signed)
PORT-A-CATH: POST OP INSTRUCTIONS  Always review your discharge instruction sheet given to you by the facility where your surgery was performed.   1. A prescription for pain medication may be given to you upon discharge. Take your pain medication as prescribed, if needed. If narcotic pain medicine is not needed, then you make take acetaminophen (Tylenol) or ibuprofen (Advil) as needed.  2. Take your usually prescribed medications unless otherwise directed. 3. If you need a refill on your pain medication, please contact our office. All narcotic pain medicine now requires a paper prescription.  Phoned in and fax refills are no longer allowed by law.  Prescriptions will not be filled after 5 pm or on weekends.  4. You should follow a light diet for the remainder of the day after your procedure. 5. Most patients will experience some mild swelling and/or bruising in the area of the incision. It may take several days to resolve. 6. It is common to experience some constipation if taking pain medication after surgery. Increasing fluid intake and taking a stool softener (such as Colace) will usually help or prevent this problem from occurring. A mild laxative (Milk of Magnesia or Miralax) should be taken according to package directions if there are no bowel movements after 48 hours.  7. Unless discharge instructions indicate otherwise, you may remove your bandages 48 hours after surgery, and you may shower at that time. You may have steri-strips (small white skin tapes) in place directly over the incision.  These strips should be left on the skin for 7-10 days.  If your surgeon used Dermabond (skin glue) on the incision, you may shower in 24 hours.  The glue will flake off over the next 2-3 weeks.  8. If your port is left accessed at the end of surgery (needle left in port), the dressing cannot get wet and should only by changed by a healthcare professional. When the port is no longer accessed (when the  needle has been removed), follow step 7.   9. ACTIVITIES:  Limit activity involving your arms for the next 72 hours. Do no strenuous exercise or activity for 1 week. You may drive when you are no longer taking prescription pain medication, you can comfortably wear a seatbelt, and you can maneuver your car. 10.You may need to see your doctor in the office for a follow-up appointment.  Please       check with your doctor.  11.When you receive a new Port-a-Cath, you will get a product guide and        ID card.  Please keep them in case you need them.  WHEN TO CALL YOUR DOCTOR [phone]): 1. Fever over 101.0 2. Chills 3. Continued bleeding from incision 4. Increased redness and tenderness at the site 5. Shortness of breath, difficulty breathing   The clinic staff is available to answer your questions during regular business hours. Please don't hesitate to call and ask to speak to one of the nurses or medical assistants for clinical concerns. If you have a medical emergency, go to the nearest emergency room or call 911.  A surgeon from [facility] is always on call at the hospital.     For further information, please visit [email or web address]   You received Tylenol at 10:50 am. You may repeat tylenol anytime after 4:50 pm if needed.    Post Anesthesia Home Care Instructions  Activity: Get plenty of rest for the remainder of the day. A responsible individual must stay  with you for 24 hours following the procedure.  For the next 24 hours, DO NOT: -Drive a car -Paediatric nurse -Drink alcoholic beverages -Take any medication unless instructed by your physician -Make any legal decisions or sign important papers.  Meals: Start with liquid foods such as gelatin or soup. Progress to regular foods as tolerated. Avoid greasy, spicy, heavy foods. If nausea and/or vomiting occur, drink only clear liquids until the nausea and/or vomiting subsides. Call your physician  if vomiting continues.  Special Instructions/Symptoms: Your throat may feel dry or sore from the anesthesia or the breathing tube placed in your throat during surgery. If this causes discomfort, gargle with warm salt water. The discomfort should disappear within 24 hours.  If you had a scopolamine patch placed behind your ear for the management of post- operative nausea and/or vomiting:  1. The medication in the patch is effective for 72 hours, after which it should be removed.  Wrap patch in a tissue and discard in the trash. Wash hands thoroughly with soap and water. 2. You may remove the patch earlier than 72 hours if you experience unpleasant side effects which may include dry mouth, dizziness or visual disturbances. 3. Avoid touching the patch. Wash your hands with soap and water after contact with the patch.

## 2020-02-11 NOTE — Telephone Encounter (Signed)
Scheduled appts per 5/28 los. Added appt note for pt to get updated appt calendar at next visit.

## 2020-02-12 ENCOUNTER — Encounter: Payer: Self-pay | Admitting: *Deleted

## 2020-02-12 ENCOUNTER — Inpatient Hospital Stay: Payer: No Typology Code available for payment source

## 2020-02-12 ENCOUNTER — Encounter: Payer: Self-pay | Admitting: Oncology

## 2020-02-12 ENCOUNTER — Inpatient Hospital Stay: Payer: No Typology Code available for payment source | Attending: Oncology

## 2020-02-12 ENCOUNTER — Inpatient Hospital Stay (HOSPITAL_BASED_OUTPATIENT_CLINIC_OR_DEPARTMENT_OTHER): Payer: No Typology Code available for payment source | Admitting: Medical

## 2020-02-12 ENCOUNTER — Other Ambulatory Visit: Payer: Self-pay

## 2020-02-12 VITALS — BP 130/75 | HR 80 | Temp 98.1°F | Resp 18

## 2020-02-12 DIAGNOSIS — K5900 Constipation, unspecified: Secondary | ICD-10-CM | POA: Insufficient documentation

## 2020-02-12 DIAGNOSIS — C50412 Malignant neoplasm of upper-outer quadrant of left female breast: Secondary | ICD-10-CM

## 2020-02-12 DIAGNOSIS — Z923 Personal history of irradiation: Secondary | ICD-10-CM | POA: Insufficient documentation

## 2020-02-12 DIAGNOSIS — Z79899 Other long term (current) drug therapy: Secondary | ICD-10-CM | POA: Insufficient documentation

## 2020-02-12 DIAGNOSIS — H9209 Otalgia, unspecified ear: Secondary | ICD-10-CM | POA: Insufficient documentation

## 2020-02-12 DIAGNOSIS — Z170 Estrogen receptor positive status [ER+]: Secondary | ICD-10-CM | POA: Insufficient documentation

## 2020-02-12 DIAGNOSIS — Z5111 Encounter for antineoplastic chemotherapy: Secondary | ICD-10-CM | POA: Insufficient documentation

## 2020-02-12 DIAGNOSIS — J45909 Unspecified asthma, uncomplicated: Secondary | ICD-10-CM | POA: Insufficient documentation

## 2020-02-12 DIAGNOSIS — Z95828 Presence of other vascular implants and grafts: Secondary | ICD-10-CM

## 2020-02-12 DIAGNOSIS — T8090XA Unspecified complication following infusion and therapeutic injection, initial encounter: Secondary | ICD-10-CM

## 2020-02-12 DIAGNOSIS — Z7952 Long term (current) use of systemic steroids: Secondary | ICD-10-CM | POA: Insufficient documentation

## 2020-02-12 DIAGNOSIS — Z7689 Persons encountering health services in other specified circumstances: Secondary | ICD-10-CM | POA: Insufficient documentation

## 2020-02-12 DIAGNOSIS — R21 Rash and other nonspecific skin eruption: Secondary | ICD-10-CM | POA: Insufficient documentation

## 2020-02-12 DIAGNOSIS — K1239 Other oral mucositis (ulcerative): Secondary | ICD-10-CM | POA: Insufficient documentation

## 2020-02-12 DIAGNOSIS — Z9221 Personal history of antineoplastic chemotherapy: Secondary | ICD-10-CM | POA: Insufficient documentation

## 2020-02-12 LAB — CBC WITH DIFFERENTIAL (CANCER CENTER ONLY)
Abs Immature Granulocytes: 0.03 10*3/uL (ref 0.00–0.07)
Basophils Absolute: 0 10*3/uL (ref 0.0–0.1)
Basophils Relative: 0 %
Eosinophils Absolute: 0 10*3/uL (ref 0.0–0.5)
Eosinophils Relative: 0 %
HCT: 39.4 % (ref 36.0–46.0)
Hemoglobin: 13 g/dL (ref 12.0–15.0)
Immature Granulocytes: 0 %
Lymphocytes Relative: 12 %
Lymphs Abs: 1.4 10*3/uL (ref 0.7–4.0)
MCH: 27.7 pg (ref 26.0–34.0)
MCHC: 33 g/dL (ref 30.0–36.0)
MCV: 84 fL (ref 80.0–100.0)
Monocytes Absolute: 0.3 10*3/uL (ref 0.1–1.0)
Monocytes Relative: 3 %
Neutro Abs: 9.1 10*3/uL — ABNORMAL HIGH (ref 1.7–7.7)
Neutrophils Relative %: 85 %
Platelet Count: 260 10*3/uL (ref 150–400)
RBC: 4.69 MIL/uL (ref 3.87–5.11)
RDW: 13.2 % (ref 11.5–15.5)
WBC Count: 10.9 10*3/uL — ABNORMAL HIGH (ref 4.0–10.5)
nRBC: 0 % (ref 0.0–0.2)

## 2020-02-12 LAB — CMP (CANCER CENTER ONLY)
ALT: 19 U/L (ref 0–44)
AST: 15 U/L (ref 15–41)
Albumin: 3.7 g/dL (ref 3.5–5.0)
Alkaline Phosphatase: 76 U/L (ref 38–126)
Anion gap: 11 (ref 5–15)
BUN: 9 mg/dL (ref 6–20)
CO2: 22 mmol/L (ref 22–32)
Calcium: 9.7 mg/dL (ref 8.9–10.3)
Chloride: 107 mmol/L (ref 98–111)
Creatinine: 0.73 mg/dL (ref 0.44–1.00)
GFR, Est AFR Am: >60 mL/min (ref >60)
GFR, Estimated: >60 mL/min (ref >60)
Glucose, Bld: 119 mg/dL — ABNORMAL HIGH (ref 70–99)
Potassium: 3.8 mmol/L (ref 3.5–5.1)
Sodium: 140 mmol/L (ref 135–145)
Total Bilirubin: 0.3 mg/dL (ref 0.3–1.2)
Total Protein: 7.5 g/dL (ref 6.5–8.1)

## 2020-02-12 MED ORDER — SODIUM CHLORIDE 0.9 % IV SOLN
150.0000 mg | Freq: Once | INTRAVENOUS | Status: AC
  Administered 2020-02-12: 150 mg via INTRAVENOUS
  Filled 2020-02-12: qty 150

## 2020-02-12 MED ORDER — FAMOTIDINE IN NACL 20-0.9 MG/50ML-% IV SOLN
20.0000 mg | Freq: Once | INTRAVENOUS | Status: AC
  Administered 2020-02-12: 20 mg via INTRAVENOUS

## 2020-02-12 MED ORDER — PALONOSETRON HCL INJECTION 0.25 MG/5ML
INTRAVENOUS | Status: AC
  Filled 2020-02-12: qty 5

## 2020-02-12 MED ORDER — PALONOSETRON HCL INJECTION 0.25 MG/5ML
0.2500 mg | Freq: Once | INTRAVENOUS | Status: AC
  Administered 2020-02-12: 0.25 mg via INTRAVENOUS

## 2020-02-12 MED ORDER — SODIUM CHLORIDE 0.9 % IV SOLN
Freq: Once | INTRAVENOUS | Status: AC
  Filled 2020-02-12: qty 250

## 2020-02-12 MED ORDER — DOXORUBICIN HCL CHEMO IV INJECTION 2 MG/ML
60.0000 mg/m2 | Freq: Once | INTRAVENOUS | Status: AC
  Administered 2020-02-12: 118 mg via INTRAVENOUS
  Filled 2020-02-12: qty 59

## 2020-02-12 MED ORDER — SODIUM CHLORIDE 0.9 % IV SOLN
600.0000 mg/m2 | Freq: Once | INTRAVENOUS | Status: AC
  Administered 2020-02-12: 1180 mg via INTRAVENOUS
  Filled 2020-02-12: qty 59

## 2020-02-12 MED ORDER — SODIUM CHLORIDE 0.9% FLUSH
10.0000 mL | INTRAVENOUS | Status: DC | PRN
  Administered 2020-02-12: 10 mL via INTRAVENOUS
  Filled 2020-02-12: qty 10

## 2020-02-12 MED ORDER — SODIUM CHLORIDE 0.9 % IV SOLN
10.0000 mg | Freq: Once | INTRAVENOUS | Status: AC
  Administered 2020-02-12: 10 mg via INTRAVENOUS
  Filled 2020-02-12: qty 10

## 2020-02-12 MED ORDER — DIPHENHYDRAMINE HCL 50 MG/ML IJ SOLN
25.0000 mg | Freq: Once | INTRAMUSCULAR | Status: AC
  Administered 2020-02-12: 25 mg via INTRAVENOUS

## 2020-02-12 MED ORDER — SODIUM CHLORIDE 0.9% FLUSH
10.0000 mL | INTRAVENOUS | Status: DC | PRN
  Administered 2020-02-12: 10 mL
  Filled 2020-02-12: qty 10

## 2020-02-12 MED ORDER — HEPARIN SOD (PORK) LOCK FLUSH 100 UNIT/ML IV SOLN
500.0000 [IU] | Freq: Once | INTRAVENOUS | Status: AC | PRN
  Administered 2020-02-12: 500 [IU]
  Filled 2020-02-12: qty 5

## 2020-02-12 MED ORDER — METHYLPREDNISOLONE SODIUM SUCC 125 MG IJ SOLR
60.0000 mg | Freq: Once | INTRAMUSCULAR | Status: AC
  Administered 2020-02-12: 60 mg via INTRAVENOUS

## 2020-02-12 NOTE — Progress Notes (Signed)
During doxorubicin push, patient began c/o itching around mouth. Noted one isolated hive on the right side of her face. Patient also began c/o chest discomfort radiating to her back. Administration paused. Sandi Mealy PA-C came to infusion room and hypersensitivity protocol initiated. Medicated as documented in [facility]. Symptoms improved rapidly and patient states chest discomfort completely resolved. Doxorubicin administration resumed.  Shortly after restarting, patient began c/o chest discomfort, again. Denied other symptoms. Administration paused. Sandi Mealy aware. Orders received, repeated, and confirmed. Medicated as documented in [facility] and symptoms quickly resolved. Doxorubicin administration resumed and completed with no further issues. Demetrius Charity (pharmacist aware) of scenario.

## 2020-02-12 NOTE — Progress Notes (Signed)
Met w/ pt to introduce myself as her Arboriculturist.  Pt stated she has applied for financial assistance thru the hospital on 02/07/20.  The hospital will contact her with the outcome.  I informed her of the [employer], went over what it covers and gave her an expense sheet.  She would like to apply so she will bring proof of income on 02/14/20.  She has my card for any questions or concerns she may have in the future.

## 2020-02-12 NOTE — Progress Notes (Signed)
Following completion of cytoxan infusion, patient complained of intermittent chest discomfort similar to discomfort felt earlier in treatment. Patient flushed with normal saline and Sandi Mealy, PA-C made aware. Received orders for 60 mg of solu-medrol via verbal order from [employer]. Patient observed for additional time following administration of the solu-medrol. Patient reported resolution of symptoms and reported feeling "much better". Sandi Mealy made Dr. Jana Hakim aware and Tanner gave the okay to allow patient to go home with the resolution of her symptoms. Patient educated via use of a translator the signs and symptoms of an acute reaction and how to call for assistance.

## 2020-02-12 NOTE — Patient Instructions (Signed)

## 2020-02-12 NOTE — Patient Instructions (Signed)
[facility] Discharge Instructions for Patients Receiving Chemotherapy  Today you received the following chemotherapy agents: doxorubicin and cyclophosphamide.  To help prevent nausea and vomiting after your treatment, we encourage you to take your nausea medication as directed.   If you develop nausea and vomiting that is not controlled by your nausea medication, call the clinic.   BELOW ARE SYMPTOMS THAT SHOULD BE REPORTED IMMEDIATELY:  *FEVER GREATER THAN 100.5 F  *CHILLS WITH OR WITHOUT FEVER  NAUSEA AND VOMITING THAT IS NOT CONTROLLED WITH YOUR NAUSEA MEDICATION  *UNUSUAL SHORTNESS OF BREATH  *UNUSUAL BRUISING OR BLEEDING  TENDERNESS IN MOUTH AND THROAT WITH OR WITHOUT PRESENCE OF ULCERS  *URINARY PROBLEMS  *BOWEL PROBLEMS  UNUSUAL RASH Items with * indicate a potential emergency and should be followed up as soon as possible.  Feel free to call the clinic should you have any questions or concerns. The clinic phone number is [phone].  Please show the CHEMO ALERT CARD at check-in to the Emergency Department and triage nurse.  Doxorubicin injection What is this medicine? DOXORUBICIN (dox oh ROO bi sin) is a chemotherapy drug. It is used to treat many kinds of cancer like leukemia, lymphoma, neuroblastoma, sarcoma, and Wilms' tumor. It is also used to treat bladder cancer, breast cancer, lung cancer, ovarian cancer, stomach cancer, and thyroid cancer. This medicine may be used for other purposes; ask your health care provider or pharmacist if you have questions. COMMON BRAND NAME(S): Adriamycin, Adriamycin PFS, Adriamycin RDF, Rubex What should I tell my health care provider before I take this medicine? They need to know if you have any of these conditions:  heart disease  history of low blood counts caused by a medicine  liver disease  recent or ongoing radiation therapy  an unusual or allergic reaction to doxorubicin, other chemotherapy  agents, other medicines, foods, dyes, or preservatives  pregnant or trying to get pregnant  breast-feeding How should I use this medicine? This drug is given as an infusion into a vein. It is administered in a hospital or clinic by a specially trained health care professional. If you have pain, swelling, burning or any unusual feeling around the site of your injection, tell your health care professional right away. Talk to your pediatrician regarding the use of this medicine in children. Special care may be needed. Overdosage: If you think you have taken too much of this medicine contact a poison control center or emergency room at once. NOTE: This medicine is only for you. Do not share this medicine with others. What if I miss a dose? It is important not to miss your dose. Call your doctor or health care professional if you are unable to keep an appointment. What may interact with this medicine? This medicine may interact with the following medications:  6-mercaptopurine  paclitaxel  phenytoin  St. John's Wort  trastuzumab  verapamil This list may not describe all possible interactions. Give your health care provider a list of all the medicines, herbs, non-prescription drugs, or dietary supplements you use. Also tell them if you smoke, drink alcohol, or use illegal drugs. Some items may interact with your medicine. What should I watch for while using this medicine? This drug may make you feel generally unwell. This is not uncommon, as chemotherapy can affect healthy cells as well as cancer cells. Report any side effects. Continue your course of treatment even though you feel ill unless your doctor tells you to stop. There is a maximum amount   of this medicine you should receive throughout your life. The amount depends on the medical condition being treated and your overall health. Your doctor will watch how much of this medicine you receive in your lifetime. Tell your doctor if you have  taken this medicine before. You may need blood work done while you are taking this medicine. Your urine may turn red for a few days after your dose. This is not blood. If your urine is dark or brown, call your doctor. In some cases, you may be given additional medicines to help with side effects. Follow all directions for their use. Call your doctor or health care professional for advice if you get a fever, chills or sore throat, or other symptoms of a cold or flu. Do not treat yourself. This drug decreases your body's ability to fight infections. Try to avoid being around people who are sick. This medicine may increase your risk to bruise or bleed. Call your doctor or health care professional if you notice any unusual bleeding. Talk to your doctor about your risk of cancer. You may be more at risk for certain types of cancers if you take this medicine. Do not become pregnant while taking this medicine or for 6 months after stopping it. Women should inform their doctor if they wish to become pregnant or think they might be pregnant. Men should not father a child while taking this medicine and for 6 months after stopping it. There is a potential for serious side effects to an unborn child. Talk to your health care professional or pharmacist for more information. Do not breast-feed an infant while taking this medicine. This medicine has caused ovarian failure in some women and reduced sperm counts in some men This medicine may interfere with the ability to have a child. Talk with your doctor or health care professional if you are concerned about your fertility. This medicine may cause a decrease in Co-Enzyme Q-10. You should make sure that you get enough Co-Enzyme Q-10 while you are taking this medicine. Discuss the foods you eat and the vitamins you take with your health care professional. What side effects may I notice from receiving this medicine? Side effects that you should report to your doctor or  health care professional as soon as possible:  allergic reactions like skin rash, itching or hives, swelling of the face, lips, or tongue  breathing problems  chest pain  fast or irregular heartbeat  low blood counts - this medicine may decrease the number of white blood cells, red blood cells and platelets. You may be at increased risk for infections and bleeding.  pain, redness, or irritation at site where injected  signs of infection - fever or chills, cough, sore throat, pain or difficulty passing urine  signs of decreased platelets or bleeding - bruising, pinpoint red spots on the skin, black, tarry stools, blood in the urine  swelling of the ankles, feet, hands  tiredness  weakness Side effects that usually do not require medical attention (report to your doctor or health care professional if they continue or are bothersome):  diarrhea  hair loss  mouth sores  nail discoloration or damage  nausea  red colored urine  vomiting This list may not describe all possible side effects. Call your doctor for medical advice about side effects. You may report side effects to FDA at 1-800-FDA-1088. Where should I keep my medicine? This drug is given in a hospital or clinic and will not be stored at home.  NOTE: This sheet is a summary. It may not cover all possible information. If you have questions about this medicine, talk to your doctor, pharmacist, or health care provider.  2020 Elsevier/Gold Standard (2017-04-12 11:01:26)  Cyclophosphamide Injection What is this medicine? CYCLOPHOSPHAMIDE (sye kloe FOSS fa mide) is a chemotherapy drug. It slows the growth of cancer cells. This medicine is used to treat many types of cancer like lymphoma, myeloma, leukemia, breast cancer, and ovarian cancer, to name a few. This medicine may be used for other purposes; ask your health care provider or pharmacist if you have questions. COMMON BRAND NAME(S): Cytoxan, Neosar What should I  tell my health care provider before I take this medicine? They need to know if you have any of these conditions:  heart disease  history of irregular heartbeat  infection  kidney disease  liver disease  low blood counts, like white cells, platelets, or red blood cells  on hemodialysis  recent or ongoing radiation therapy  scarring or thickening of the lungs  trouble passing urine  an unusual or allergic reaction to cyclophosphamide, other medicines, foods, dyes, or preservatives  pregnant or trying to get pregnant  breast-feeding How should I use this medicine? This drug is usually given as an injection into a vein or muscle or by infusion into a vein. It is administered in a hospital or clinic by a specially trained health care professional. Talk to your pediatrician regarding the use of this medicine in children. Special care may be needed. Overdosage: If you think you have taken too much of this medicine contact a poison control center or emergency room at once. NOTE: This medicine is only for you. Do not share this medicine with others. What if I miss a dose? It is important not to miss your dose. Call your doctor or health care professional if you are unable to keep an appointment. What may interact with this medicine?  amphotericin B  azathioprine  certain antivirals for HIV or hepatitis  certain medicines for blood pressure, heart disease, irregular heart beat  certain medicines that treat or prevent blood clots like warfarin  certain other medicines for cancer  cyclosporine  etanercept  indomethacin  medicines that relax muscles for surgery  medicines to increase blood counts  metronidazole This list may not describe all possible interactions. Give your health care provider a list of all the medicines, herbs, non-prescription drugs, or dietary supplements you use. Also tell them if you smoke, drink alcohol, or use illegal drugs. Some items may  interact with your medicine. What should I watch for while using this medicine? Your condition will be monitored carefully while you are receiving this medicine. You may need blood work done while you are taking this medicine. Drink water or other fluids as directed. Urinate often, even at night. Some products may contain alcohol. Ask your health care professional if this medicine contains alcohol. Be sure to tell all health care professionals you are taking this medicine. Certain medicines, like metronidazole and disulfiram, can cause an unpleasant reaction when taken with alcohol. The reaction includes flushing, headache, nausea, vomiting, sweating, and increased thirst. The reaction can last from 30 minutes to several hours. Do not become pregnant while taking this medicine or for 1 year after stopping it. Women should inform their health care professional if they wish to become pregnant or think they might be pregnant. Men should not father a child while taking this medicine and for 4 months after stopping it. There is   potential for serious side effects to an unborn child. Talk to your health care professional for more information. Do not breast-feed an infant while taking this medicine or for 1 week after stopping it. This medicine has caused ovarian failure in some women. This medicine may make it more difficult to get pregnant. Talk to your health care professional if you are concerned about your fertility. This medicine has caused decreased sperm counts in some men. This may make it more difficult to father a child. Talk to your health care professional if you are concerned about your fertility. Call your health care professional for advice if you get a fever, chills, or sore throat, or other symptoms of a cold or flu. Do not treat yourself. This medicine decreases your body's ability to fight infections. Try to avoid being around people who are sick. Avoid taking medicines that contain aspirin,  acetaminophen, ibuprofen, naproxen, or ketoprofen unless instructed by your health care professional. These medicines may hide a fever. Talk to your health care professional about your risk of cancer. You may be more at risk for certain types of cancer if you take this medicine. If you are going to need surgery or other procedure, tell your health care professional that you are using this medicine. Be careful brushing or flossing your teeth or using a toothpick because you may get an infection or bleed more easily. If you have any dental work done, tell your dentist you are receiving this medicine. What side effects may I notice from receiving this medicine? Side effects that you should report to your doctor or health care professional as soon as possible:  allergic reactions like skin rash, itching or hives, swelling of the face, lips, or tongue  breathing problems  nausea, vomiting  signs and symptoms of bleeding such as bloody or black, tarry stools; red or dark brown urine; spitting up blood or brown material that looks like coffee grounds; red spots on the skin; unusual bruising or bleeding from the eyes, gums, or nose  signs and symptoms of heart failure like fast, irregular heartbeat, sudden weight gain; swelling of the ankles, feet, hands  signs and symptoms of infection like fever; chills; cough; sore throat; pain or trouble passing urine  signs and symptoms of kidney injury like trouble passing urine or change in the amount of urine  signs and symptoms of liver injury like dark yellow or brown urine; general ill feeling or flu-like symptoms; light-colored stools; loss of appetite; nausea; right upper belly pain; unusually weak or tired; yellowing of the eyes or skin Side effects that usually do not require medical attention (report to your doctor or health care professional if they continue or are bothersome):  confusion  decreased hearing  diarrhea  facial flushing  hair  loss  headache  loss of appetite  missed menstrual periods  signs and symptoms of low red blood cells or anemia such as unusually weak or tired; feeling faint or lightheaded; falls  skin discoloration This list may not describe all possible side effects. Call your doctor for medical advice about side effects. You may report side effects to FDA at 1-800-FDA-1088. Where should I keep my medicine? This drug is given in a hospital or clinic and will not be stored at home. NOTE: This sheet is a summary. It may not cover all possible information. If you have questions about this medicine, talk to your doctor, pharmacist, or health care provider.  2020 Elsevier/Gold Standard (2019-06-03 09:53:29)    

## 2020-02-13 ENCOUNTER — Encounter: Payer: Self-pay | Admitting: Licensed Clinical Social Worker

## 2020-02-13 ENCOUNTER — Ambulatory Visit: Payer: Self-pay | Admitting: Licensed Clinical Social Worker

## 2020-02-13 ENCOUNTER — Ambulatory Visit (HOSPITAL_COMMUNITY)
Admission: RE | Admit: 2020-02-13 | Discharge: 2020-02-13 | Disposition: A | Discharge status: Home / Self Care | Payer: Self-pay | Source: Ambulatory Visit | Attending: Oncology | Admitting: Oncology

## 2020-02-13 ENCOUNTER — Telehealth: Payer: Self-pay | Admitting: *Deleted

## 2020-02-13 DIAGNOSIS — Z170 Estrogen receptor positive status [ER+]: Secondary | ICD-10-CM | POA: Insufficient documentation

## 2020-02-13 DIAGNOSIS — Z1379 Encounter for other screening for genetic and chromosomal anomalies: Secondary | ICD-10-CM

## 2020-02-13 DIAGNOSIS — C50412 Malignant neoplasm of upper-outer quadrant of left female breast: Secondary | ICD-10-CM | POA: Insufficient documentation

## 2020-02-13 MED ORDER — GADOBUTROL 1 MMOL/ML IV SOLN
9.0000 mL | Freq: Once | INTRAVENOUS | Status: AC | PRN
  Administered 2020-02-13: 9 mL via INTRAVENOUS

## 2020-02-13 NOTE — Telephone Encounter (Signed)
Revealed negative genetic testing.  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  We discussed that we do not know why she has breast cancer. It could be due to a different gene that we are not testing, or something our current technology cannot pick up.  It will be important for her to keep in contact with genetics to learn if additional testing may be needed in the future.

## 2020-02-13 NOTE — Progress Notes (Signed)
HPI:  [name] was previously seen in the [place] [facility] due to a personal history of cancer and concerns regarding a hereditary predisposition to cancer. Please refer to our prior cancer genetics clinic note for more information regarding our discussion, assessment and recommendations, at the time. [name]'s recent genetic test results were disclosed to her, as were recommendations warranted by these results. These results and recommendations are discussed in more detail below.  CANCER HISTORY:  Oncology History  Malignant neoplasm of upper-outer quadrant of left breast in female, estrogen receptor positive ([place])  01/27/2020 Initial Diagnosis   Malignant neoplasm of upper-outer quadrant of left breast in female, estrogen receptor positive ([place])   02/07/2020 Genetic Testing   Negative genetic testing. No pathogenic variants identified on the [facility] Common Hereditary Cancers Panel. VUS in MSH6 called c.2744C>G identified. The report date is 02/07/2020.  The Common Hereditary Cancers Panel offered by [facility] includes sequencing and/or deletion duplication testing of the following 48 genes: APC, ATM, AXIN2, BARD1, BMPR1A, BRCA1, BRCA2, BRIP1, CDH1, CDKN2A (p14ARF), CDKN2A (p16INK4a), CKD4, CHEK2, CTNNA1, DICER1, EPCAM (Deletion/duplication testing only), GREM1 (promoter region deletion/duplication testing only), KIT, MEN1, MLH1, MSH2, MSH3, MSH6, MUTYH, NBN, NF1, NHTL1, PALB2, PDGFRA, PMS2, POLD1, POLE, PTEN, RAD50, RAD51C, RAD51D, RNF43, SDHB, SDHC, SDHD, SMAD4, SMARCA4. STK11, TP53, TSC1, TSC2, and VHL.  The following genes were evaluated for sequence changes only: SDHA and HOXB13 c.251G>A variant only.   02/12/2020 -  Chemotherapy   The patient had DOXOrubicin (ADRIAMYCIN) chemo injection 118 mg, 60 mg/m2 = 118 mg, Intravenous,  Once, 1 of 4 cycles Administration: 118 mg (02/12/2020) palonosetron (ALOXI) injection 0.25 mg, 0.25 mg, Intravenous,  Once, 1 of 8  cycles Administration: 0.25 mg (02/12/2020) pegfilgrastim-cbqv (UDENYCA) injection 6 mg, 6 mg, Subcutaneous, Once, 1 of 4 cycles CARBOplatin (PARAPLATIN) in sodium chloride 0.9 % 100 mL chemo infusion, , Intravenous,  Once, 0 of 4 cycles cyclophosphamide (CYTOXAN) 1,180 mg in sodium chloride 0.9 % 250 mL chemo infusion, 600 mg/m2 = 1,180 mg, Intravenous,  Once, 1 of 4 cycles Administration: 1,180 mg (02/12/2020) PACLitaxel (TAXOL) 156 mg in sodium chloride 0.9 % 250 mL chemo infusion (</= 75m/m2), 80 mg/m2, Intravenous,  Once, 0 of 4 cycles fosaprepitant (EMEND) 150 mg in sodium chloride 0.9 % 145 mL IVPB, 150 mg, Intravenous,  Once, 1 of 8 cycles Administration: 150 mg (02/12/2020)  for chemotherapy treatment.      FAMILY HISTORY:  We obtained a detailed, 4-generation family history.  Significant diagnoses are listed below: No family history on file.  Ms. PWynona Mealsreports no family history of cancer. She has 3 daughters, 3 paternal half brothers, 1 paternal half sister, 2 maternal half brothers, 1 maternal half sister.  [name]'s mother is living at 89 patient has 3 maternal uncles, 4 maternal aunts. Maternal grandmother passed at 666 grandfather passed at 750  [name]'s father is living at 655 patient has 2 paternal aunts, 3 paternal uncles. Paternal grandfather passed in his 874s grandmother passed in her 324sbut patient is unsure of the cause.   Ms. PWynona Mealsis unaware of previous family history of genetic testing for hereditary cancer risks. Patient's maternal and paternal families are from [place] There is no reported Ashkenazi Jewish ancestry. There is no known consanguinity.  GENETIC TEST RESULTS: Genetic testing reported out on 02/07/2020 through the [facility] Common Hereditary cancer panel found no pathogenic mutations. The Common Hereditary Cancers Panel offered by [facility] includes sequencing and/or deletion duplication testing of the following 48  genes: APC, ATM, AXIN2,  BARD1, BMPR1A, BRCA1, BRCA2, BRIP1, CDH1, CDKN2A (p14ARF), CDKN2A (p16INK4a), CKD4, CHEK2, CTNNA1, DICER1, EPCAM (Deletion/duplication testing only), GREM1 (promoter region deletion/duplication testing only), KIT, MEN1, MLH1, MSH2, MSH3, MSH6, MUTYH, NBN, NF1, NHTL1, PALB2, PDGFRA, PMS2, POLD1, POLE, PTEN, RAD50, RAD51C, RAD51D, RNF43, SDHB, SDHC, SDHD, SMAD4, SMARCA4. STK11, TP53, TSC1, TSC2, and VHL.  The following genes were evaluated for sequence changes only: SDHA and HOXB13 c.251G>A variant only. The test report has been scanned into EPIC and is located under the Molecular Pathology section of the Results Review tab.  A portion of the result report is included below for reference.    We discussed with [name] that because current genetic testing is not perfect, it is possible there may be a gene mutation in one of these genes that current testing cannot detect, but that chance is small.  We also discussed, that there could be another gene that has not yet been discovered, or that we have not yet tested, that is responsible for the cancer diagnoses in the family. It is also possible there is a hereditary cause for the cancer in the family that [name] did not inherit and therefore was not identified in her testing.  Therefore, it is important to remain in touch with cancer genetics in the future so that we can continue to offer [name] the most up to date genetic testing.   Genetic testing did identify a variant of uncertain significance (VUS) was identified in the MSH6 gene called c.2744C>G.  At this time, it is unknown if this variant is associated with increased cancer risk or if this is a normal finding, but most variants such as this get reclassified to being inconsequential. It should not be used to make medical management decisions. With time, we suspect the lab will determine the significance of this variant, if any. If we do learn more about it, we will try to contact [name] to  discuss it further. However, it is important to stay in touch with [place] periodically and keep the address and phone number up to date.  ADDITIONAL GENETIC TESTING: We discussed with [name] that her genetic testing was fairly extensive.  If there are genes identified to increase cancer risk that can be analyzed in the future, we would be happy to discuss and coordinate this testing at that time.    CANCER SCREENING RECOMMENDATIONS: [name]'s test result is considered negative (normal).  This means that we have not identified a hereditary cause for her  personal history of cancer at this time. Most cancers happen by chance and this negative test suggests that her cancer may fall into this category.    While reassuring, this does not definitively rule out a hereditary predisposition to cancer. It is still possible that there could be genetic mutations that are undetectable by current technology. There could be genetic mutations in genes that have not been tested or identified to increase cancer risk.  Therefore, it is recommended she continue to follow the cancer management and screening guidelines provided by her oncology and primary healthcare provider.   An individual's cancer risk and medical management are not determined by genetic test results alone. Overall cancer risk assessment incorporates additional factors, including personal medical history, family history, and any available genetic information that may result in a personalized plan for cancer prevention and surveillance.  RECOMMENDATIONS FOR FAMILY MEMBERS:  Relatives in this family might be at some increased risk of developing cancer, over  the general population risk, simply due to the family history of cancer.  We recommended female relatives in this family have a yearly mammogram beginning at age 25, or 82 years younger than the earliest onset of cancer, an annual clinical breast exam, and perform monthly breast self-exams. Female  relatives in this family should also have a gynecological exam as recommended by their primary provider. All family members should have a colonoscopy by age 55, or as directed by their physicians.  FOLLOW-UP: Lastly, we discussed with [name] that cancer genetics is a rapidly advancing field and it is possible that new genetic tests will be appropriate for her and/or her family members in the future. We encouraged her to remain in contact with cancer genetics on an annual basis so we can update her personal and family histories and let her know of advances in cancer genetics that may benefit this family.   Our contact number was provided. [name]'s questions were answered to her satisfaction, and she knows she is welcome to call us at anytime with additional questions or concerns.   Faith Rogue, MS, [facility] Genetic Counselor [email or web address] Phone: [phone]

## 2020-02-13 NOTE — Progress Notes (Signed)
    DATE:  02/12/2020                                          X  CHEMO/IMMUNOTHERAPY REACTION           MD:  Dr. Sarajane Jews Magrinat  AGENT/BLOOD PRODUCT RECEIVING TODAY:              Adriamycin and Cytoxan   AGENT/BLOOD PRODUCT RECEIVING IMMEDIATELY PRIOR TO REACTION:          Adriamycin   VS: BP:     122/69   P:       57       SPO2:       97 % on room air                  REACTION(S):           Back pain, chest tightness and facial rash   PREMEDS:     Aloxi, dexamethasone and Emend   INTERVENTION: Adriamycin was paused and the patient was given Pepcid 20 mg IV x1.  Adriamycin was restarted and the patient again developed back pain.  Adriamycin was paused and the patient was given Benadryl 25 mg IV x1.  Adriamycin was restarted with the patient again developing back pain and chest tightness.  She was given Solu-Medrol 60 mg IV x1.  Adriamycin was restarted and completed.   Review of Systems  Review of Systems  Constitutional: Negative for chills, diaphoresis and fever.  HENT: Negative for trouble swallowing and voice change.   Respiratory: Positive for chest tightness. Negative for cough, shortness of breath and wheezing.   Cardiovascular: Negative for chest pain and palpitations.  Gastrointestinal: Negative for abdominal pain, constipation, diarrhea, nausea and vomiting.  Musculoskeletal: Positive for back pain. Negative for myalgias.  Skin: Positive for rash.  Neurological: Negative for dizziness, light-headedness and headaches.     Physical Exam  Physical Exam Constitutional:      General: She is not in acute distress.    Appearance: She is not diaphoretic.  HENT:     Head: Normocephalic and atraumatic.   Cardiovascular:     Rate and Rhythm: Normal rate and regular rhythm.     Heart sounds: Normal heart sounds. No murmur. No friction rub. No gallop.   Pulmonary:     Effort: Pulmonary effort is normal. No respiratory distress.     Breath sounds: Normal breath sounds. No  wheezing or rales.  Skin:    General: Skin is warm and dry.     Findings: Erythema present. No rash.  Neurological:     Mental Status: She is alert.     OUTCOME: The patient completed Adriamycin and then proceeded with Cytoxan.  There was concern expressed by pharmacy that given her repeat symptoms with attempts at finishing Adriamycin that this could indicate that she will have reactions to Adriamycin in the future.   Sandi Mealy, MHS, PA-C

## 2020-02-14 ENCOUNTER — Inpatient Hospital Stay: Payer: Self-pay

## 2020-02-14 ENCOUNTER — Other Ambulatory Visit: Payer: Self-pay

## 2020-02-14 ENCOUNTER — Encounter: Payer: Self-pay | Admitting: *Deleted

## 2020-02-14 VITALS — BP 128/62 | HR 68

## 2020-02-14 DIAGNOSIS — Z170 Estrogen receptor positive status [ER+]: Secondary | ICD-10-CM

## 2020-02-14 MED ORDER — PEGFILGRASTIM-CBQV 6 MG/0.6ML ~~LOC~~ SOSY
PREFILLED_SYRINGE | SUBCUTANEOUS | Status: AC
  Filled 2020-02-14: qty 0.6

## 2020-02-14 MED ORDER — PEGFILGRASTIM-CBQV 6 MG/0.6ML ~~LOC~~ SOSY
6.0000 mg | PREFILLED_SYRINGE | Freq: Once | SUBCUTANEOUS | Status: AC
  Administered 2020-02-14: 6 mg via SUBCUTANEOUS

## 2020-02-14 NOTE — Patient Instructions (Signed)

## 2020-02-17 ENCOUNTER — Other Ambulatory Visit: Payer: Self-pay | Admitting: Oncology

## 2020-02-17 ENCOUNTER — Encounter: Payer: Self-pay | Admitting: Oncology

## 2020-02-17 ENCOUNTER — Other Ambulatory Visit: Payer: Self-pay | Admitting: *Deleted

## 2020-02-17 ENCOUNTER — Telehealth: Payer: Self-pay | Admitting: *Deleted

## 2020-02-17 MED ORDER — PANTOPRAZOLE SODIUM 40 MG PO TBEC
40.0000 mg | DELAYED_RELEASE_TABLET | Freq: Every day | ORAL | 1 refills | Status: DC
Start: 2020-02-17 — End: 2020-04-29

## 2020-02-17 NOTE — Progress Notes (Signed)
Pt is approved for the $1000 [employer] grant.  

## 2020-02-17 NOTE — Telephone Encounter (Signed)
Per [name] interpreter, pt has c/o constipation, indigestion, and neck pain. Per Dr.Magrinat, pt is to take Miralax for constipation, Protonix 40mg  daily x1 week to help with indigestion. Protonix was sent to [facility] on pt list. Interpreter will call pt.

## 2020-02-18 ENCOUNTER — Other Ambulatory Visit: Payer: Self-pay | Admitting: Oncology

## 2020-02-18 ENCOUNTER — Other Ambulatory Visit: Payer: Self-pay | Admitting: *Deleted

## 2020-02-18 DIAGNOSIS — C50412 Malignant neoplasm of upper-outer quadrant of left female breast: Secondary | ICD-10-CM

## 2020-02-18 DIAGNOSIS — Z170 Estrogen receptor positive status [ER+]: Secondary | ICD-10-CM

## 2020-02-18 NOTE — Progress Notes (Signed)
z

## 2020-02-19 ENCOUNTER — Telehealth: Payer: Self-pay | Admitting: *Deleted

## 2020-02-19 ENCOUNTER — Inpatient Hospital Stay (HOSPITAL_BASED_OUTPATIENT_CLINIC_OR_DEPARTMENT_OTHER): Payer: Self-pay | Admitting: Oncology

## 2020-02-19 ENCOUNTER — Other Ambulatory Visit: Payer: Self-pay

## 2020-02-19 ENCOUNTER — Telehealth: Payer: Self-pay | Admitting: Oncology

## 2020-02-19 ENCOUNTER — Inpatient Hospital Stay: Payer: Self-pay

## 2020-02-19 VITALS — BP 106/60 | HR 86 | Temp 97.4°F | Resp 20 | Ht 62.0 in | Wt 193.8 lb

## 2020-02-19 DIAGNOSIS — Z170 Estrogen receptor positive status [ER+]: Secondary | ICD-10-CM

## 2020-02-19 DIAGNOSIS — C50412 Malignant neoplasm of upper-outer quadrant of left female breast: Secondary | ICD-10-CM

## 2020-02-19 DIAGNOSIS — Z95828 Presence of other vascular implants and grafts: Secondary | ICD-10-CM

## 2020-02-19 LAB — COMPREHENSIVE METABOLIC PANEL
ALT: 13 U/L (ref 0–44)
AST: 11 U/L — ABNORMAL LOW (ref 15–41)
Albumin: 3.6 g/dL (ref 3.5–5.0)
Alkaline Phosphatase: 96 U/L (ref 38–126)
Anion gap: 8 (ref 5–15)
BUN: 11 mg/dL (ref 6–20)
CO2: 27 mmol/L (ref 22–32)
Calcium: 9.1 mg/dL (ref 8.9–10.3)
Chloride: 100 mmol/L (ref 98–111)
Creatinine, Ser: 0.7 mg/dL (ref 0.44–1.00)
GFR calc Af Amer: >60 mL/min (ref >60)
GFR calc non Af Amer: >60 mL/min (ref >60)
Glucose, Bld: 96 mg/dL (ref 70–99)
Potassium: 4.3 mmol/L (ref 3.5–5.1)
Sodium: 135 mmol/L (ref 135–145)
Total Bilirubin: 0.6 mg/dL (ref 0.3–1.2)
Total Protein: 6.8 g/dL (ref 6.5–8.1)

## 2020-02-19 LAB — CBC WITH DIFFERENTIAL/PLATELET
Abs Immature Granulocytes: 0.01 10*3/uL (ref 0.00–0.07)
Basophils Absolute: 0 10*3/uL (ref 0.0–0.1)
Basophils Relative: 1 %
Eosinophils Absolute: 0.1 10*3/uL (ref 0.0–0.5)
Eosinophils Relative: 3 %
HCT: 37.1 % (ref 36.0–46.0)
Hemoglobin: 12.3 g/dL (ref 12.0–15.0)
Immature Granulocytes: 1 %
Lymphocytes Relative: 51 %
Lymphs Abs: 0.9 10*3/uL (ref 0.7–4.0)
MCH: 28.3 pg (ref 26.0–34.0)
MCHC: 33.2 g/dL (ref 30.0–36.0)
MCV: 85.3 fL (ref 80.0–100.0)
Monocytes Absolute: 0.1 10*3/uL (ref 0.1–1.0)
Monocytes Relative: 8 %
Neutro Abs: 0.7 10*3/uL — ABNORMAL LOW (ref 1.7–7.7)
Neutrophils Relative %: 36 %
Platelets: 127 10*3/uL — ABNORMAL LOW (ref 150–400)
RBC: 4.35 MIL/uL (ref 3.87–5.11)
RDW: 13 % (ref 11.5–15.5)
WBC: 1.8 10*3/uL — ABNORMAL LOW (ref 4.0–10.5)
nRBC: 0 % (ref 0.0–0.2)

## 2020-02-19 MED ORDER — HEPARIN SOD (PORK) LOCK FLUSH 100 UNIT/ML IV SOLN
500.0000 [IU] | Freq: Once | INTRAVENOUS | Status: AC
  Administered 2020-02-19: 500 [IU] via INTRAVENOUS
  Filled 2020-02-19: qty 5

## 2020-02-19 MED ORDER — SODIUM CHLORIDE 0.9% FLUSH
10.0000 mL | INTRAVENOUS | Status: DC | PRN
  Administered 2020-02-19: 10 mL via INTRAVENOUS
  Filled 2020-02-19: qty 10

## 2020-02-19 NOTE — Telephone Encounter (Signed)
Scheduled appts per 6/9 los. Gave pt a print out of AVS and appt calendar.  

## 2020-02-19 NOTE — Progress Notes (Signed)
[place]  [phone] [phone]     ID: [name] DOB: Jun 30, 1976  MR#: [id number]  ION#:[id number]  Patient Care Team: Rutherford Guys, MD as PCP - General (Family Medicine) Rolm Bookbinder, MD as Consulting Physician (General Surgery) Kharlie Bring, Virgie Dad, MD as Consulting Physician (Oncology) Eppie Gibson, MD as Attending Physician (Radiation Oncology) Rockwell Germany, RN as Oncology Nurse Navigator Mauro Kaufmann, RN as Oncology Nurse Navigator Chauncey Cruel, MD OTHER MD:  CHIEF COMPLAINT: Triple negative breast cancer  CURRENT TREATMENT: Neoadjuvant chemotherapy   INTERVAL HISTORY: Elasha returns today for follow up and treatment of her triple negative breast cancer.  She is accompanied by our Spanish interpreter  Echocardiogram from 02/07/2020 showed an ejection fraction of 55-60%.  Chest CT performed the same day showed: 3.7 cm biopsy-proven malignancy in upper left breast; indeterminate small nodules/nodes in lateral left breast; 2.5 cm left axillary node; additional left axillary nodes and a 7 mm left subpectoral node; no evidence of distant metastases or suspicious osseous lesions.  Bone scan also performed that day was negative for skeletal metastasis.  She underwent port placement on 02/11/2020.   Her genetic testing results returned negative, with a variant of uncertain significance in MSH6.  She received her first chemotherapy dose 02/12/2020.  Today is day 8 cycle 1 of 4 planned cycles of cyclophosphamide and doxorubicin.  She underwent breast MRI on 02/13/2020 showing: breast composition B; 6 cm biopsy-proven malignancy; no additional enhancing masses or abnormal areas of enhancement; previously biopsied 0.8 cm intramammary lymph node in upper-outer left breast remains suspicious despite benign pathology results; two additional morphologically-abnormal left level 1 axillary lymph nodes; no evidence of right breast  malignancy.   REVIEW OF SYSTEMS: Chloey did moderately well with her first cycle of chemotherapy.  She had significant reflux and constipation.  She was started on omeprazole and that is helping.  She also started on MiraLAX and she says that problem is improved.  She had nausea but no vomiting.  She felt extremely fatigued and faint T and she felt like her eyes were rolling out of her head when she had the chemo.  When she had the shot on day 3 she felt like it took away her energy, but she had no bony pain.  She does have a significant headache in the right side of her head related to her port.  She is moving the neck normally.  She is drinking about 3-4 bottles of liquid daily.  Detailed review of systems today was otherwise stable.   HISTORY OF CURRENT ILLNESS: From the original intake note:  [name] herself palpated a left beast lump with associated pain. She underwent bilateral diagnostic mammography with tomography and left breast ultrasonography at [facility] on 01/23/2020 showing: breast density category B; palpable 3.6 cm left breast mass with associated calcifications at 1 o'clock; 0.8 cm left breast mass at 2 o'clock, concerning for abnormal intramammary lymph node; at least two abnormal left axillary lymph nodes.  Accordingly on 01/23/2020 she proceeded to biopsy of the left breast area in question. The pathology from this procedure ([id number]) showed: invasive ductal carcinoma at 1 o'clock, grade 3. Prognostic indicators significant for: estrogen receptor, 20% positive with moderate staining intensity and progesterone receptor, 0% negative. Proliferation marker Ki67 at 40%. HER2 negative by immunohistochemistry (1+).  The left axillary lymph node confirmed metastatic carcinoma.  The left intramammary lymph node was negative for carcinoma.  The patient's subsequent history  is as detailed below.   PAST MEDICAL HISTORY: Past Medical History:  Diagnosis Date  . Allergy    . Asthma   . Chronic back pain   . Hx of migraines   . Medical history non-contributory     PAST SURGICAL HISTORY: Past Surgical History:  Procedure Laterality Date  . CESAREAN SECTION WITH BILATERAL TUBAL LIGATION Bilateral 07/23/2015   Procedure: CESAREAN SECTION WITH BILATERAL TUBAL LIGATION;  Surgeon: Donnamae Jude, MD;  Location: [facility];  Service: Obstetrics;  Laterality: Bilateral;  . PORTACATH PLACEMENT Right 02/11/2020   Procedure: INSERTION PORT-A-CATH WITH ULTRASOUND GUIDANCE;  Surgeon: Rolm Bookbinder, MD;  Location: [facility];  Service: General;  Laterality: Right;  . TUBAL LIGATION      FAMILY HISTORY: No family history on file.  Her parents are both living as of 01/2020, her father age 38 and her mother age 79. She has three maternal half-siblings (1 sister, 2 brothers) and four paternal half-siblings (1 sister, 3 brothers). There is no family history of cancer to her knowledge.   GYNECOLOGIC HISTORY:  Patient's last menstrual period was 01/20/2020 (exact date). Menarche: 44 years old Age at first live birth: 44 years old [place] P 3 LMP 01/2020 Contraceptive s/p tubal ligation HRT n/a  Hysterectomy? no BSO? 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 Husband Vaughan Sine works for a Financial trader, both in [employer]. They are both from [place]. She lives at home with [place] and their three children-- [name], [place], and [name]. She attends an [employer].    ADVANCED DIRECTIVES: In the absence of any documentation to the contrary, the patient's spouse is their HCPOA.    HEALTH MAINTENANCE: Social History   Tobacco Use  . Smoking status: Never Smoker  . Smokeless tobacco: Never Used  Substance Use Topics  . Alcohol use: No  . Drug use: No     Colonoscopy: n/a (age)  PAP: 11/2017, negative (per patient)  Bone density: n/a  (age)   Allergies  Allergen Reactions  . Doxorubicin Hcl Rash    Experience chest tightness, abdominal pain, and redness around lips during and shortly after doxorubicin bolus.     Current Outpatient Medications  Medication Sig Dispense Refill  . albuterol (VENTOLIN HFA) 108 (90 Base) MCG/ACT inhaler Inhale into the lungs every 6 (six) hours as needed for wheezing or shortness of breath.    . dexamethasone (DECADRON) 4 MG tablet Take 2 tablets (8 mg) by mouth twice daily with food starting the day after chemotherapy (day 2), continue day3, take the last dose the morning of day 4 30 tablet 1  . ibuprofen (ADVIL,MOTRIN) 800 MG tablet Take 1 tablet (800 mg total) by mouth 3 (three) times daily. 21 tablet 0  . lidocaine-prilocaine (EMLA) cream Apply to affected area once 30 g 3  . loratadine (CLARITIN) 10 MG tablet Take 1 tablet (10 mg total) by mouth daily. 60 tablet 0  . LORazepam (ATIVAN) 0.5 MG tablet Take 1 tablet at bedtime on days 1 ,2 and 3 of each chemotherapy treatment, then at bedtime as needed 30 tablet 0  . oxyCODONE (OXY IR/ROXICODONE) 5 MG immediate release tablet Take 1 tablet (5 mg total) by mouth every 6 (six) hours as needed. 10 tablet 0  . pantoprazole (PROTONIX) 40 MG tablet Take 1 tablet (40 mg total) by mouth daily. 30 tablet 1  . prochlorperazine (COMPAZINE) 10 MG  tablet Take 1 tablet (10 mg total) by mouth 3 (three) times daily before meals for 3 days. 30 tablet 1   Current Facility-Administered Medications  Medication Dose Route Frequency Provider Last Rate Last Admin  . sodium chloride flush (NS) 0.9 % injection 10 mL  10 mL Intravenous PRN Linnea Todisco, Virgie Dad, MD   10 mL at 02/19/20 1135    OBJECTIVE: [name] who appears stated age  83:   02/19/20 1127  BP: 106/60  Pulse: 86  Resp: 20  Temp: (!) 97.4 F (36.3 C)  SpO2: 100%     Body mass index is 35.45 kg/m.   Wt Readings from Last 3 Encounters:  02/19/20 193 lb 12.8 oz (87.9 kg)  02/11/20 196 lb  10.4 oz (89.2 kg)  02/07/20 193 lb 6.4 oz (87.7 kg)      ECOG FS:1 - Symptomatic but completely ambulatory  Sclerae unicteric, EOMs intact Wearing a mask No cervical or supraclavicular adenopathy Lungs no rales or rhonchi Heart regular rate and rhythm Abd soft, nontender, positive bowel sounds MSK no focal spinal tenderness, no upper extremity lymphedema Neuro: nonfocal, well oriented, appropriate affect Breasts: Deferred   LAB RESULTS:  CMP     Component Value Date/Time   NA 135 02/19/2020 1145   NA 138 08/15/2017 1525   K 4.3 02/19/2020 1145   CL 100 02/19/2020 1145   CO2 27 02/19/2020 1145   GLUCOSE 96 02/19/2020 1145   BUN 11 02/19/2020 1145   BUN 12 08/15/2017 1525   CREATININE 0.70 02/19/2020 1145   CREATININE 0.73 02/12/2020 0820   CALCIUM 9.1 02/19/2020 1145   PROT 6.8 02/19/2020 1145   PROT 7.4 01/03/2017 1642   ALBUMIN 3.6 02/19/2020 1145   ALBUMIN 4.3 01/03/2017 1642   AST 11 (L) 02/19/2020 1145   AST 15 02/12/2020 0820   ALT 13 02/19/2020 1145   ALT 19 02/12/2020 0820   ALKPHOS 96 02/19/2020 1145   BILITOT 0.6 02/19/2020 1145   BILITOT 0.3 02/12/2020 0820   GFRNONAA >60 02/19/2020 1145   GFRNONAA >60 02/12/2020 0820   GFRAA >60 02/19/2020 1145   GFRAA >60 02/12/2020 0820    No results found for: TOTALPROTELP, ALBUMINELP, A1GS, A2GS, BETS, BETA2SER, GAMS, MSPIKE, SPEI  Lab Results  Component Value Date   WBC 1.8 (L) 02/19/2020   NEUTROABS PENDING 02/19/2020   HGB 12.3 02/19/2020   HCT 37.1 02/19/2020   MCV 85.3 02/19/2020   PLT 127 (L) 02/19/2020    No results found for: LABCA2  No components found for: JQBHAL937  No results for input(s): INR in the last 168 hours.  No results found for: LABCA2  No results found for: TKW409  No results found for: BDZ329  No results found for: JME268  No results found for: CA2729  No components found for: HGQUANT  No results found for: CEA1 / No results found for: CEA1   No results found for:  AFPTUMOR  No results found for: CHROMOGRNA  No results found for: KPAFRELGTCHN, LAMBDASER, KAPLAMBRATIO (kappa/lambda light chains)  No results found for: HGBA, HGBA2QUANT, HGBFQUANT, HGBSQUAN (Hemoglobinopathy evaluation)   No results found for: LDH  No results found for: IRON, TIBC, IRONPCTSAT (Iron and TIBC)  No results found for: FERRITIN  Urinalysis No results found for: COLORURINE, APPEARANCEUR, LABSPEC, PHURINE, GLUCOSEU, HGBUR, BILIRUBINUR, KETONESUR, PROTEINUR, UROBILINOGEN, NITRITE, LEUKOCYTESUR   STUDIES: CT CHEST W CONTRAST  Result Date: 02/07/2020 CLINICAL DATA:  Newly diagnosed left breast cancer EXAM: CT CHEST WITH CONTRAST TECHNIQUE:  Multidetector CT imaging of the chest was performed during intravenous contrast administration. CONTRAST:  69m OMNIPAQUE IOHEXOL 300 MG/ML  SOLN COMPARISON:  None. FINDINGS: Cardiovascular: Heart is normal in size.  No pericardial effusion. No evidence of thoracic aortic aneurysm. Mediastinum/Nodes: No suspicious mediastinal or hilar lymphadenopathy. Dominant 2.5 cm left axillary node (series 2/image 46), compatible with nodal metastasis. Additional left axillary nodes. 7 mm short axis left subpectoral node (series 2/image 28), also suspicious. Lungs/Pleura: No suspicious pulmonary nodules. No focal consolidation. No pleural effusion or pneumothorax. Upper Abdomen: Visualized upper abdomen is grossly unremarkable. Musculoskeletal: Mild degenerative changes of the visualized thoracolumbar spine. Correlate with pending bone scan. Dominant 2.5 x 3.7 cm mass in the upper left breast (series 2/image 43), likely corresponding to the patient's newly diagnosed breast cancer. Small nodules/nodes in the lateral left breast (series 2/image 4), indeterminate. IMPRESSION: Dominant 3.7 cm mass in the upper left breast, likely corresponding to the patient's newly diagnosed breast cancer. Small nodules/nodes in the lateral left breast, indeterminate. Dominant  2.5 cm left axillary node, compatible with nodal metastases. Additional left axillary nodes and 7 mm short axis left subpectoral node, also suspicious. No evidence of distant metastases. No suspicious osseous lesions on CT. Correlate with pending bone scan. Electronically Signed   By: SJulian HyM.D.   On: 02/07/2020 12:06   NM Bone Scan Whole Body  Result Date: 02/07/2020 CLINICAL DATA:  Breast cancer.  Bone pain. EXAM: NUCLEAR MEDICINE WHOLE BODY BONE SCAN TECHNIQUE: Whole body anterior and posterior images were obtained approximately 3 hours after intravenous injection of radiopharmaceutical. RADIOPHARMACEUTICALS:  21.6 mCi Technetium-952mDP IV COMPARISON:  Chest CT same day FINDINGS: No abnormal radiotracer accumulation within the axillary appendicular skeleton to suggest metastatic disease. Degenerate uptake noted in the shoulders and knees. IMPRESSION: No scintigraphic evidence skeletal metastasis. Electronically Signed   By: StSuzy Bouchard.D.   On: 02/07/2020 17:05   MR BREAST BILATERAL W WO CONTRAST INC CAD  Result Date: 02/13/2020 CLINICAL DATA:  [age]ear old female with recently diagnosed grade 3 invasive ductal carcinoma post ultrasound-guided biopsy of a 3.6 cm mass in the left breast at the 1 o'clock position. An additional intramammary lymph node in the left breast at the 2 o'clock position was biopsied and negative for carcinoma however considered discordant with excision recommended. Abnormal lymph node in the left axilla was biopsied with pathology demonstrating metastatic carcinoma. LABS:  Not applicable. EXAM: BILATERAL BREAST MRI WITH AND WITHOUT CONTRAST TECHNIQUE: Multiplanar, multisequence MR images of both breasts were obtained prior to and following the intravenous administration of 9 ml of Gadavist Three-dimensional MR images were rendered by post-processing of the original MR data on an independent workstation. The three-dimensional MR images were interpreted, and  findings are reported in the following complete MRI report for this study. Three dimensional images were evaluated at the independent DynaCad workstation COMPARISON:  Previous exams. FINDINGS: Breast composition: b.  Scattered fibroglandular tissue. Background parenchymal enhancement: Mild. Right breast: No suspicious rapidly enhancing masses or abnormal areas of enhancement in the right breast to suggest malignancy. Left breast: Irregular enhancing mass at site of biopsy proven malignancy in the upper-outer left breast anterior to mid depth measures 6 cm AP, 3.1 cm transverse and 2.3 cm craniocaudal. The 0.8 cm previously biopsied intramammary lymph node in the upper-outer left breast demonstrates cortical thickening and remains suspicious for metastases despite benign pathology results. No additional enhancing masses or abnormal areas of enhancement identified in the left breast to suggest additional sites of  disease. Lymph nodes: Three abnormal left level I axillary nodes are visualized, the largest of which was previously biopsied measuring 3 cm and appears partially necrotic. No internal mammary lymphadenopathy. Ancillary findings:  None. IMPRESSION: 1. Biopsy proven malignancy in the upper-outer left breast measures 6 x 3.1 x 2.3 cm. No additional enhancing masses or abnormal areas of enhancement identified in the left breast to suggest additional sites of disease. 2. Previously biopsied 0.8 cm intramammary lymph node in the upper-outer left breast remains suspicious for metastatic disease despite benign pathology results, with excision recommended. 3. Three morphologically abnormal left level 1 axillary lymph nodes, the largest of which was previously biopsied and measures up to 3 cm. 4.  No MRI evidence of malignancy in the right breast. RECOMMENDATION: Treatment plan for known left breast malignancy. BI-RADS CATEGORY  6: Known biopsy-proven malignancy. Electronically Signed   By: Everlean Alstrom M.D.   On:  02/13/2020 15:08   DG CHEST PORT 1 VIEW  Result Date: 02/11/2020 CLINICAL DATA:  Port-A-Cath placement. EXAM: PORTABLE CHEST 1 VIEW COMPARISON:  Jan 12, 2018. FINDINGS: The heart size and mediastinal contours are within normal limits. Both lungs are clear. Interval placement of right internal jugular Port-A-Cath with distal tip in expected position of cavoatrial junction. No pneumothorax or pleural effusion is noted. The visualized skeletal structures are unremarkable. IMPRESSION: Interval placement of right internal jugular Port-A-Cath. No pneumothorax is noted. Electronically Signed   By: Marijo Conception M.D.   On: 02/11/2020 13:55   DG Fluoro Guide CV Line-No Report  Result Date: 02/11/2020 Fluoroscopy was utilized by the requesting physician.  No radiographic interpretation.   US BREAST LTD UNI LEFT INC AXILLA  Result Date: 01/23/2020 CLINICAL DATA:  44 year old female presenting with a new lump in the left breast. EXAM: DIGITAL DIAGNOSTIC BILATERAL MAMMOGRAM WITH CAD AND TOMO ULTRASOUND LEFT BREAST COMPARISON:  None. ACR Breast Density Category b: There are scattered areas of fibroglandular density. FINDINGS: Mammogram: Right breast: No suspicious mass, distortion, or microcalcifications are identified to suggest presence of malignancy. Left breast: Spot compression tomosynthesis views were performed in addition to full field standard views. There is an irregular spiculated mass at the palpable site of concern in the upper-outer quadrant of the left breast. The mass spans approximately 3.5 cm. Associated with the mass are several pleomorphic microcalcifications. In the far upper outer quadrant of the left breast there is an oval circumscribed mass measuring 0.9 cm, likely an intramammary lymph node. There are several enlarged left axillary lymph nodes. Mammographic images were processed with CAD. On physical exam, I palpate a fixed discrete mass at the palpable site of concern in the upper outer left  breast. Ultrasound: Targeted ultrasound is performed in the left breast at 1 o'clock 5 cm from nipple demonstrating an irregular hypoechoic mass measuring 3.4 x 2.1 x 3.6 cm, which corresponds to the palpable abnormality. There is internal vascularity within the mass. Targeted ultrasound in the left breast at 2 o'clock 10 cm from the nipple demonstrates a round circumscribed hypoechoic mass with hilar flow measuring 0.8 x 0.8 x 0.8 cm, consistent with an intramammary lymph node. There is loss of the normal fatty hilum. Targeted ultrasound of the left axilla demonstrates at least two abnormal lymph nodes with cortical thickening measuring up to 1.7 cm. IMPRESSION: 1. Palpable left breast mass with associated calcifications overall measuring 3.6 cm is highly suspicious. 2. Left breast mass at 2 o'clock 10 cm from nipple is concerning for an abnormal intramammary lymph  node. 3.  Left axillary adenopathy (at least 2 lymph nodes). RECOMMENDATION: Ultrasound-guided core needle biopsy x 3 for the above findings. This will be performed same day. I have discussed the findings and recommendations with the patient. BI-RADS CATEGORY  5: Highly suggestive of malignancy. Electronically Signed   By: Audie Pinto M.D.   On: 01/23/2020 13:36   ECHOCARDIOGRAM COMPLETE  Result Date: 02/07/2020    ECHOCARDIOGRAM REPORT   Patient Name:   KYANA AICHER Thomas Date of Exam: 02/07/2020 Medical Rec #:  [id number]       Height:       62.0 in Accession #:    [id number]      Weight:       196.0 lb Date of Birth:  1975/12/19       BSA:          1.895 m Patient Age:    73 years        BP:           127/82 mmHg Patient Gender: F               HR:           62 bpm. Exam Location:  Outpatient Procedure: 2D Echo, Cardiac Doppler, Color Doppler and Strain Analysis Indications:    Z51.11 Encounter for antineoplastic chemotheraphy  History:        Patient has no prior history of Echocardiogram examinations.  Sonographer:    Jonelle Sidle Dance Referring  Phys: [place]  1. Left ventricular ejection fraction, by estimation, is 55 to 60%. The left ventricle has normal function. The left ventricle has no regional wall motion abnormalities. Left ventricular diastolic parameters were normal.  2. Right ventricular systolic function is normal. The right ventricular size is normal.  3. The mitral valve is normal in structure. No evidence of mitral valve regurgitation. No evidence of mitral stenosis.  4. The aortic valve is normal in structure. Aortic valve regurgitation is not visualized. No aortic stenosis is present. FINDINGS  Left Ventricle: Left ventricular ejection fraction, by estimation, is 55 to 60%. The left ventricle has normal function. The left ventricle has no regional wall motion abnormalities. The left ventricular internal cavity size was normal in size. There is  no left ventricular hypertrophy. Left ventricular diastolic parameters were normal. Right Ventricle: The right ventricular size is normal. No increase in right ventricular wall thickness. Right ventricular systolic function is normal. Left Atrium: Left atrial size was normal in size. Right Atrium: Right atrial size was normal in size. Pericardium: There is no evidence of pericardial effusion. Mitral Valve: The mitral valve is normal in structure. No evidence of mitral valve regurgitation. No evidence of mitral valve stenosis. Tricuspid Valve: The tricuspid valve is grossly normal. Tricuspid valve regurgitation is trivial. No evidence of tricuspid stenosis. Aortic Valve: The aortic valve is normal in structure. Aortic valve regurgitation is not visualized. No aortic stenosis is present. Pulmonic Valve: The pulmonic valve was normal in structure. Pulmonic valve regurgitation is not visualized. Aorta: The aortic root and ascending aorta are structurally normal, with no evidence of dilitation. IAS/Shunts: The atrial septum is grossly normal.  LEFT VENTRICLE PLAX 2D LVIDd:          4.20 cm  Diastology LVIDs:         3.00 cm  LV e' lateral:   11.70 cm/s LV PW:         0.90 cm  LV E/e' lateral: 8.0 LV IVS:  0.80 cm  LV e' medial:    10.15 cm/s LVOT diam:     1.70 cm  LV E/e' medial:  9.3 LV SV:         45 LV SV Index:   24 LVOT Area:     2.27 cm  RIGHT VENTRICLE             IVC RV Basal diam:  2.20 cm     IVC diam: 1.40 cm RV S prime:     10.90 cm/s TAPSE (M-mode): 1.7 cm LEFT ATRIUM             Index       RIGHT ATRIUM          Index LA diam:        3.10 cm 1.64 cm/m  RA Area:     9.40 cm LA Vol (A2C):   57.5 ml 30.34 ml/m RA Volume:   16.40 ml 8.65 ml/m LA Vol (A4C):   21.8 ml 11.50 ml/m LA Biplane Vol: 35.5 ml 18.73 ml/m  AORTIC VALVE LVOT Vmax:   91.40 cm/s LVOT Vmean:  60.500 cm/s LVOT VTI:    0.197 m  AORTA Ao Root diam: 3.00 cm Ao Asc diam:  3.00 cm MITRAL VALVE MV Area (PHT): 4.49 cm    SHUNTS MV Decel Time: 169 msec    Systemic VTI:  0.20 m MV E velocity: 94.00 cm/s  Systemic Diam: 1.70 cm MV A velocity: 67.70 cm/s MV E/A ratio:  1.39 Mertie Moores MD Electronically signed by Mertie Moores MD Signature Date/Time: 02/07/2020/10:37:03 AM    Final    [place] AXILLARY NODE CORE BIOPSY LEFT  Addendum Date: 01/24/2020   ADDENDUM REPORT: 01/24/2020 13:48 ADDENDUM: Pathology revealed GRADE III INVASIVE DUCTAL CARCINOMA of the Left breast, 1 o'clock axis. Tumor infiltrating lymphocytes are noted. This was found to be concordant by Dr. Franki Cabot. Pathology revealed ONE LYMPH NODE, NEGATIVE FOR CARCINOMA of the Left breast, 2 o'clock axis. This was found to be discordant by Dr. Franki Cabot, with excision recommended. 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 This was found to be concordant by Dr. Franki Cabot. Pathology results were discussed with the patient by telephone by Kathrine Haddock, Bilingual Patient Services Representative. The patient reported doing well after the biopsies with tenderness at the sites. Post biopsy  instructions and care were reviewed and questions were answered. The patient was encouraged to call [facility] for any additional concerns. The patient was referred to [facility] on Jan 29, 2020. Recommendation for a bilateral breast MRI for further evaluation of extent of disease and age. Pathology results reported by Terie Purser, RN on 01/24/2020. Electronically Signed   By: Franki Cabot M.D.   On: 01/24/2020 13:48   Result Date: 01/24/2020 CLINICAL DATA:  Patient presents for ultrasound-guided biopsy of a highly suspicious mass in the LEFT breast at the 1 o'clock axis. Patient also presents for ultrasound-guided biopsy of a suspicious mass/lymph node in the LEFT breast at the 2 o'clock axis. Finally, patient presents for ultrasound-guided biopsy of an enlarged/morphologically abnormal lymph node in the LEFT axilla. EXAM: ULTRASOUND GUIDED LEFT BREAST CORE NEEDLE BIOPSY ULTRASOUND-GUIDED LEFT BREAST CORE NEEDLE BIOPSY ULTRASOUND-GUIDED LEFT AXILLA CORE NEEDLE BIOPSY COMPARISON:  Previous exam(s). PROCEDURE: I met with the patient and we discussed the procedure of ultrasound-guided biopsy, including benefits and alternatives. We discussed  the high likelihood of a successful procedure. We discussed the risks of the procedure, including infection, bleeding, tissue injury, clip migration, and inadequate sampling. Informed written consent was given. The usual time-out protocol was performed immediately prior to the procedure. Site 1: Lesion quadrant: Upper outer quadrant Using sterile technique and 1% Lidocaine as local anesthetic, under direct ultrasound visualization, a 12 gauge spring-loaded device was used to perform biopsy of the LEFT breast mass at the 1 o'clock axis using a lateral approach. At the conclusion of the procedure ribbon shaped tissue marker clip was deployed into the biopsy cavity. Site 2:  Lesion quadrant: Upper outer quadrant Using sterile technique and 1% Lidocaine as local anesthetic, under direct ultrasound visualization, a 14 gauge spring-loaded device was used to perform biopsy of the LEFT breast mass/lymph node at the 2 o'clock axis using a lateral approach. At the conclusion of the procedure coil shaped tissue marker clip was deployed into the biopsy cavity. Site 3: Lesion quadrant: Axilla Using sterile technique and 1% Lidocaine as local anesthetic, under direct ultrasound visualization, a 14 gauge spring-loaded device was used to perform biopsy of the enlarged lymph node in the LEFT axilla using a lateral approach. At the conclusion of the procedure Q shaped tissue marker clip was deployed into the biopsy cavity. Follow up 2 view mammogram was performed and dictated separately. IMPRESSION: 1. Ultrasound guided biopsy of the highly suspicious mass in the LEFT breast at the 1 o'clock axis. No apparent complications. 2. Ultrasound guided biopsy of the suspicious mass/lymph node in the LEFT breast at the 2 o'clock axis. No apparent complications. 3. Ultrasound guided biopsy of the enlarged lymph node in the LEFT axilla. No apparent complications. Electronically Signed: By: Franki Cabot M.D. On: 01/23/2020 15:38   MS DIGITAL DIAG TOMO BILAT  Result Date: 01/23/2020 CLINICAL DATA:  44 year old female presenting with a new lump in the left breast. EXAM: DIGITAL DIAGNOSTIC BILATERAL MAMMOGRAM WITH CAD AND TOMO ULTRASOUND LEFT BREAST COMPARISON:  None. ACR Breast Density Category b: There are scattered areas of fibroglandular density. FINDINGS: Mammogram: Right breast: No suspicious mass, distortion, or microcalcifications are identified to suggest presence of malignancy. Left breast: Spot compression tomosynthesis views were performed in addition to full field standard views. There is an irregular spiculated mass at the palpable site of concern in the upper-outer quadrant of the left breast.  The mass spans approximately 3.5 cm. Associated with the mass are several pleomorphic microcalcifications. In the far upper outer quadrant of the left breast there is an oval circumscribed mass measuring 0.9 cm, likely an intramammary lymph node. There are several enlarged left axillary lymph nodes. Mammographic images were processed with CAD. On physical exam, I palpate a fixed discrete mass at the palpable site of concern in the upper outer left breast. Ultrasound: Targeted ultrasound is performed in the left breast at 1 o'clock 5 cm from nipple demonstrating an irregular hypoechoic mass measuring 3.4 x 2.1 x 3.6 cm, which corresponds to the palpable abnormality. There is internal vascularity within the mass. Targeted ultrasound in the left breast at 2 o'clock 10 cm from the nipple demonstrates a round circumscribed hypoechoic mass with hilar flow measuring 0.8 x 0.8 x 0.8 cm, consistent with an intramammary lymph node. There is loss of the normal fatty hilum. Targeted ultrasound of the left axilla demonstrates at least two abnormal lymph nodes with cortical thickening measuring up to 1.7 cm. IMPRESSION: 1. Palpable left breast mass with associated calcifications overall measuring 3.6 cm is highly  suspicious. 2. Left breast mass at 2 o'clock 10 cm from nipple is concerning for an abnormal intramammary lymph node. 3.  Left axillary adenopathy (at least 2 lymph nodes). RECOMMENDATION: Ultrasound-guided core needle biopsy x 3 for the above findings. This will be performed same day. I have discussed the findings and recommendations with the patient. BI-RADS CATEGORY  5: Highly suggestive of malignancy. Electronically Signed   By: Audie Pinto M.D.   On: 01/23/2020 13:36   MM CLIP PLACEMENT LEFT  Result Date: 01/23/2020 CLINICAL DATA:  Status post 3 ultrasound-guided biopsies today. EXAM: DIAGNOSTIC LEFT MAMMOGRAM POST ULTRASOUND BIOPSY x3 COMPARISON:  Previous exam(s). FINDINGS: Mammographic images were  obtained following ultrasound guided biopsies of masses in the LEFT breast at the 1 o'clock axis and 2 o'clock axis followed by ultrasound-guided biopsy of an enlarged lymph node in the LEFT axilla. All 3 biopsy marking clips are in expected position at the sites of biopsy. IMPRESSION: 1. Appropriate positioning of the ribbon shaped biopsy marking clip at the site of biopsy in the upper-outer quadrant of the LEFT breast corresponding to the mass at the 1 o'clock axis. 2. Appropriate positioning of the coil shaped biopsy marking clip at the site of biopsy in the upper-outer quadrant of the LEFT breast corresponding to the mass at the 2 o'clock axis. 3. Appropriate positioning of the Q shaped biopsy marking clip at the site of biopsy in the LEFT axilla. Final Assessment: Post Procedure Mammograms for Marker Placement Electronically Signed   By: Franki Cabot M.D.   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 Tumor infiltrating lymphocytes are noted. This was found to be concordant by Dr. Franki Cabot. Pathology revealed ONE LYMPH NODE, NEGATIVE FOR CARCINOMA of the Left breast, 2 o'clock axis. This was found to be discordant by Dr. Franki Cabot, with excision recommended. 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 This was found to be concordant by Dr. Franki Cabot. Pathology results were discussed with the patient by telephone by Kathrine Haddock, Bilingual Patient Services Representative. The patient reported doing well after the biopsies with tenderness at the sites. Post biopsy instructions and care were reviewed and questions were answered. The patient was encouraged to call [facility] for any additional concerns. The patient was referred to  [facility] at [facility] on Jan 29, 2020. Recommendation for a bilateral breast MRI for further evaluation of extent of disease and age. Pathology results reported by Terie Purser, RN on 01/24/2020. Electronically Signed   By: Franki Cabot M.D.   On: 01/24/2020 13:48   Result Date: 01/24/2020 CLINICAL DATA:  Patient presents for ultrasound-guided biopsy of a highly suspicious mass in the LEFT breast at the 1 o'clock axis. Patient also presents for ultrasound-guided biopsy of a suspicious mass/lymph node in the LEFT breast at the 2 o'clock axis. Finally, patient presents for ultrasound-guided biopsy of an enlarged/morphologically abnormal lymph node in the LEFT axilla. EXAM: ULTRASOUND GUIDED LEFT BREAST CORE NEEDLE BIOPSY ULTRASOUND-GUIDED LEFT BREAST CORE NEEDLE BIOPSY ULTRASOUND-GUIDED LEFT AXILLA CORE NEEDLE BIOPSY COMPARISON:  Previous exam(s). PROCEDURE: I met with the patient and we discussed the procedure of ultrasound-guided biopsy, including benefits and alternatives. We discussed the  high likelihood of a successful procedure. We discussed the risks of the procedure, including infection, bleeding, tissue injury, clip migration, and inadequate sampling. Informed written consent was given. The usual time-out protocol was performed immediately prior to the procedure. Site 1: Lesion quadrant: Upper outer quadrant Using sterile technique and 1% Lidocaine as local anesthetic, under direct ultrasound visualization, a 12 gauge spring-loaded device was used to perform biopsy of the LEFT breast mass at the 1 o'clock axis using a lateral approach. At the conclusion of the procedure ribbon shaped tissue marker clip was deployed into the biopsy cavity. Site 2: Lesion quadrant: Upper outer quadrant Using sterile technique and 1% Lidocaine as local anesthetic, under direct ultrasound visualization, a 14 gauge spring-loaded device was used to perform biopsy  of the LEFT breast mass/lymph node at the 2 o'clock axis using a lateral approach. At the conclusion of the procedure coil shaped tissue marker clip was deployed into the biopsy cavity. Site 3: Lesion quadrant: Axilla Using sterile technique and 1% Lidocaine as local anesthetic, under direct ultrasound visualization, a 14 gauge spring-loaded device was used to perform biopsy of the enlarged lymph node in the LEFT axilla using a lateral approach. At the conclusion of the procedure Q shaped tissue marker clip was deployed into the biopsy cavity. Follow up 2 view mammogram was performed and dictated separately. IMPRESSION: 1. Ultrasound guided biopsy of the highly suspicious mass in the LEFT breast at the 1 o'clock axis. No apparent complications. 2. Ultrasound guided biopsy of the suspicious mass/lymph node in the LEFT breast at the 2 o'clock axis. No apparent complications. 3. Ultrasound guided biopsy of the enlarged lymph node in the LEFT axilla. No apparent complications. Electronically Signed: By: Franki Cabot M.D. 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 Tumor infiltrating lymphocytes are noted. This was found to be concordant by Dr. Franki Cabot. Pathology revealed ONE LYMPH NODE, NEGATIVE FOR CARCINOMA of the Left breast, 2 o'clock axis. This was found to be discordant by Dr. Franki Cabot, with excision recommended. 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 This was found to be concordant by Dr. Franki Cabot. Pathology results were discussed with the patient by telephone by Kathrine Haddock, Bilingual Patient Services Representative. The patient reported doing well after the biopsies with tenderness at the sites. Post biopsy instructions and care were reviewed  and questions were answered. The patient was encouraged to call [facility] for any additional concerns. The patient was referred to [facility] at [facility] on Jan 29, 2020. Recommendation for a bilateral breast MRI for further evaluation of extent of disease and age. Pathology results reported by Terie Purser, RN on 01/24/2020. Electronically Signed   By: Franki Cabot M.D.   On: 01/24/2020 13:48   Result Date: 01/24/2020 CLINICAL DATA:  Patient presents for ultrasound-guided biopsy of a highly suspicious mass in the LEFT breast at the 1 o'clock axis. Patient also presents for ultrasound-guided biopsy of a suspicious mass/lymph node in the LEFT breast at the 2 o'clock axis. Finally, patient presents for ultrasound-guided biopsy of an enlarged/morphologically abnormal lymph node in the LEFT axilla. EXAM: ULTRASOUND GUIDED LEFT BREAST CORE NEEDLE BIOPSY ULTRASOUND-GUIDED LEFT BREAST CORE  NEEDLE BIOPSY ULTRASOUND-GUIDED LEFT AXILLA CORE NEEDLE BIOPSY COMPARISON:  Previous exam(s). PROCEDURE: I met with the patient and we discussed the procedure of ultrasound-guided biopsy, including benefits and alternatives. We discussed the high likelihood of a successful procedure. We discussed the risks of the procedure, including infection, bleeding, tissue injury, clip migration, and inadequate sampling. Informed written consent was given. The usual time-out protocol was performed immediately prior to the procedure. Site 1: Lesion quadrant: Upper outer quadrant Using sterile technique and 1% Lidocaine as local anesthetic, under direct ultrasound visualization, a 12 gauge spring-loaded device was used to perform biopsy of the LEFT breast mass at the 1 o'clock axis using a lateral approach. At the conclusion of the procedure ribbon shaped tissue marker clip was deployed into the biopsy cavity. Site 2: Lesion quadrant: Upper outer quadrant  Using sterile technique and 1% Lidocaine as local anesthetic, under direct ultrasound visualization, a 14 gauge spring-loaded device was used to perform biopsy of the LEFT breast mass/lymph node at the 2 o'clock axis using a lateral approach. At the conclusion of the procedure coil shaped tissue marker clip was deployed into the biopsy cavity. Site 3: Lesion quadrant: Axilla Using sterile technique and 1% Lidocaine as local anesthetic, under direct ultrasound visualization, a 14 gauge spring-loaded device was used to perform biopsy of the enlarged lymph node in the LEFT axilla using a lateral approach. At the conclusion of the procedure Q shaped tissue marker clip was deployed into the biopsy cavity. Follow up 2 view mammogram was performed and dictated separately. IMPRESSION: 1. Ultrasound guided biopsy of the highly suspicious mass in the LEFT breast at the 1 o'clock axis. No apparent complications. 2. Ultrasound guided biopsy of the suspicious mass/lymph node in the LEFT breast at the 2 o'clock axis. No apparent complications. 3. Ultrasound guided biopsy of the enlarged lymph node in the LEFT axilla. No apparent complications. Electronically Signed: By: Franki Cabot M.D. On: 01/23/2020 15:38     ELIGIBLE FOR AVAILABLE RESEARCH PROTOCOL: [id number]?  ASSESSMENT: 44 y.o. 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  (a) chest CT scan and bone scan 02/07/2020 showed no evidence of metastatic disease  (1) neoadjuvant chemotherapy will consist of doxorubicin and cyclophosphamide in dose dense fashion x4 starting 02/12/2020 to be followed by paclitaxel and carboplatin weekly x12  (a) echo 02/07/2020 shows an ejection fraction in the 55-60% range  (2) definitive surgery to follow  (3) adjuvant radiation  (4) genetics testing  (a) Negative genetic testing.  No pathogenic variants identified on the [facility] Common Hereditary Cancers Panel. VUS in MSH6 called c.[id number]>G identified. The report date is 02/07/2020.  The Common Hereditary Cancers Panel offered by [facility] includes sequencing and/or deletion duplication testing of the following 48 genes: APC, ATM, AXIN2, BARD1, BMPR1A, BRCA1, BRCA2, BRIP1, CDH1, CDKN2A (p14ARF), CDKN2A (p16INK4a), CKD4, CHEK2, CTNNA1, DICER1, EPCAM (Deletion/duplication testing only), GREM1 (promoter region deletion/duplication testing only), KIT, MEN1, MLH1, MSH2, MSH3, MSH6, MUTYH, NBN, NF1, NHTL1, PALB2, PDGFRA, PMS2, POLD1, POLE, PTEN, RAD50, RAD51C, RAD51D, RNF43, SDHB, SDHC, SDHD, SMAD4, SMARCA4. STK11, TP53, TSC1, TSC2, and VHL.  The following genes were evaluated for sequence changes only: SDHA and HOXB13 c.251G>A variant only.   PLAN: Meeah tolerated her first cycle of chemotherapy moderately well.  She did have significant fatigue, reflux, and constipation.  She also has been having more pain related to her port  and we are used to and we have called her surgeon's office to see if they want to take a look at that.  In the meantime I have suggested that she take Aleve and Tylenol together up to 3 times a day.  When she tries to take the [place] it makes her feel strange and of course it constipates her further.  The big change we are making is simply for her to start omeprazole day 1 of treatment and take it for 7 days.  She will also start MiraLAX the day of treatment and take it for 5 days.  I think we do not need to make any other changes.  She is doing very well as far as liquid intake is concerned.  Her counts today are adequate, with an ANC of 700 and I am not starting her on prophylactic ciprofloxacin.  She is already mourning the loss of her here.  Today we discussed some things she can do to look feminine despite being bald.  I showed her an image of her MRI which she photographed for her husband to see also.  That is  our starting point.  She will not see [place] on the day of treatment, but she may ask for me to drop by the treatment area if necessary.  Otherwise we will see her on June 24 and repeat her counts at that time.  Total encounter time 30 minutes.  Virgie Dad. Ryli Standlee, MD 02/19/2020 12:20 PM Medical Oncology and Hematology [facility]: [address], [place] [address] Tel. [phone]    Fax. [phone]   This document serves as a record of services personally performed by Lurline Del, MD. It was created on his behalf by Wilburn Mylar, a trained medical scribe. The creation of this record is based on the scribe's personal observations and the provider's statements to them.   I, Lurline Del MD, have reviewed the above documentation for accuracy and completeness, and I agree with the above.   *Total Encounter Time as defined by the Centers for Medicare and Medicaid Services includes, in addition to the face-to-face time of a patient visit (documented in the note above) non-face-to-face time: obtaining and reviewing outside history, ordering and reviewing medications, tests or procedures, care coordination (communications with other health care professionals or caregivers) and documentation in the medical record.

## 2020-02-19 NOTE — Telephone Encounter (Signed)
Patient reports "a lot" pain in the right side of her neck above port X 3 days. No redness or swelling noted. Port accessed without difficulty- labs drawn.   Dr Cristal Generous nurse notified of recent neck pain in area of port. They will contact her to have port evaluated

## 2020-02-19 NOTE — Telephone Encounter (Signed)
Faxed office notes to Dr Steward Ros

## 2020-02-20 ENCOUNTER — Encounter: Payer: Self-pay | Admitting: *Deleted

## 2020-02-26 ENCOUNTER — Inpatient Hospital Stay: Payer: Self-pay

## 2020-02-26 ENCOUNTER — Other Ambulatory Visit: Payer: Self-pay | Admitting: Oncology

## 2020-02-26 ENCOUNTER — Other Ambulatory Visit: Payer: Self-pay

## 2020-02-26 VITALS — BP 106/66 | HR 67 | Temp 98.7°F | Resp 18

## 2020-02-26 DIAGNOSIS — Z170 Estrogen receptor positive status [ER+]: Secondary | ICD-10-CM

## 2020-02-26 DIAGNOSIS — C50412 Malignant neoplasm of upper-outer quadrant of left female breast: Secondary | ICD-10-CM

## 2020-02-26 DIAGNOSIS — Z95828 Presence of other vascular implants and grafts: Secondary | ICD-10-CM

## 2020-02-26 LAB — CMP (CANCER CENTER ONLY)
ALT: 21 U/L (ref 0–44)
AST: 17 U/L (ref 15–41)
Albumin: 3.7 g/dL (ref 3.5–5.0)
Alkaline Phosphatase: 107 U/L (ref 38–126)
Anion gap: 9 (ref 5–15)
BUN: 9 mg/dL (ref 6–20)
CO2: 27 mmol/L (ref 22–32)
Calcium: 9.5 mg/dL (ref 8.9–10.3)
Chloride: 106 mmol/L (ref 98–111)
Creatinine: 0.77 mg/dL (ref 0.44–1.00)
GFR, Est AFR Am: >60 mL/min (ref >60)
GFR, Estimated: >60 mL/min (ref >60)
Glucose, Bld: 106 mg/dL — ABNORMAL HIGH (ref 70–99)
Potassium: 4.1 mmol/L (ref 3.5–5.1)
Sodium: 142 mmol/L (ref 135–145)
Total Bilirubin: <0.2 mg/dL — ABNORMAL LOW (ref 0.3–1.2)
Total Protein: 7.1 g/dL (ref 6.5–8.1)

## 2020-02-26 LAB — CBC WITH DIFFERENTIAL (CANCER CENTER ONLY)
Abs Immature Granulocytes: 0.36 10*3/uL — ABNORMAL HIGH (ref 0.00–0.07)
Basophils Absolute: 0 10*3/uL (ref 0.0–0.1)
Basophils Relative: 1 %
Eosinophils Absolute: 0 10*3/uL (ref 0.0–0.5)
Eosinophils Relative: 0 %
HCT: 37 % (ref 36.0–46.0)
Hemoglobin: 12 g/dL (ref 12.0–15.0)
Immature Granulocytes: 6 %
Lymphocytes Relative: 28 %
Lymphs Abs: 1.6 10*3/uL (ref 0.7–4.0)
MCH: 27.8 pg (ref 26.0–34.0)
MCHC: 32.4 g/dL (ref 30.0–36.0)
MCV: 85.8 fL (ref 80.0–100.0)
Monocytes Absolute: 0.4 10*3/uL (ref 0.1–1.0)
Monocytes Relative: 7 %
Neutro Abs: 3.3 10*3/uL (ref 1.7–7.7)
Neutrophils Relative %: 58 %
Platelet Count: 158 10*3/uL (ref 150–400)
RBC: 4.31 MIL/uL (ref 3.87–5.11)
RDW: 13.7 % (ref 11.5–15.5)
WBC Count: 5.6 10*3/uL (ref 4.0–10.5)
nRBC: 0.4 % — ABNORMAL HIGH (ref 0.0–0.2)

## 2020-02-26 MED ORDER — SODIUM CHLORIDE 0.9% FLUSH
10.0000 mL | INTRAVENOUS | Status: DC | PRN
  Administered 2020-02-26: 10 mL via INTRAVENOUS
  Filled 2020-02-26: qty 10

## 2020-02-26 MED ORDER — PALONOSETRON HCL INJECTION 0.25 MG/5ML
INTRAVENOUS | Status: AC
  Filled 2020-02-26: qty 5

## 2020-02-26 MED ORDER — FAMOTIDINE IN NACL 20-0.9 MG/50ML-% IV SOLN
20.0000 mg | Freq: Once | INTRAVENOUS | Status: AC
  Administered 2020-02-26: 20 mg via INTRAVENOUS

## 2020-02-26 MED ORDER — EPIRUBICIN HCL CHEMO IV INJECTION 200 MG/100ML
75.0000 mg/m2 | Freq: Once | INTRAVENOUS | Status: AC
  Administered 2020-02-26: 148 mg via INTRAVENOUS
  Filled 2020-02-26: qty 74

## 2020-02-26 MED ORDER — SODIUM CHLORIDE 0.9 % IV SOLN
10.0000 mg | Freq: Once | INTRAVENOUS | Status: AC
  Administered 2020-02-26: 10 mg via INTRAVENOUS
  Filled 2020-02-26: qty 10

## 2020-02-26 MED ORDER — SODIUM CHLORIDE 0.9 % IV SOLN
150.0000 mg | Freq: Once | INTRAVENOUS | Status: AC
  Administered 2020-02-26: 150 mg via INTRAVENOUS
  Filled 2020-02-26: qty 150

## 2020-02-26 MED ORDER — PALONOSETRON HCL INJECTION 0.25 MG/5ML
0.2500 mg | Freq: Once | INTRAVENOUS | Status: AC
  Administered 2020-02-26: 0.25 mg via INTRAVENOUS

## 2020-02-26 MED ORDER — SODIUM CHLORIDE 0.9% FLUSH
10.0000 mL | INTRAVENOUS | Status: DC | PRN
  Administered 2020-02-26: 10 mL
  Filled 2020-02-26: qty 10

## 2020-02-26 MED ORDER — SODIUM CHLORIDE 0.9 % IV SOLN
Freq: Once | INTRAVENOUS | Status: AC
  Filled 2020-02-26: qty 250

## 2020-02-26 MED ORDER — DIPHENHYDRAMINE HCL 50 MG/ML IJ SOLN
25.0000 mg | Freq: Once | INTRAMUSCULAR | Status: AC
  Administered 2020-02-26: 25 mg via INTRAVENOUS

## 2020-02-26 MED ORDER — SODIUM CHLORIDE 0.9 % IV SOLN
600.0000 mg/m2 | Freq: Once | INTRAVENOUS | Status: AC
  Administered 2020-02-26: 1180 mg via INTRAVENOUS
  Filled 2020-02-26: qty 59

## 2020-02-26 MED ORDER — DIPHENHYDRAMINE HCL 50 MG/ML IJ SOLN
INTRAMUSCULAR | Status: AC
  Filled 2020-02-26: qty 1

## 2020-02-26 MED ORDER — HEPARIN SOD (PORK) LOCK FLUSH 100 UNIT/ML IV SOLN
500.0000 [IU] | Freq: Once | INTRAVENOUS | Status: AC | PRN
  Administered 2020-02-26: 500 [IU]
  Filled 2020-02-26: qty 5

## 2020-02-26 MED ORDER — FAMOTIDINE IN NACL 20-0.9 MG/50ML-% IV SOLN
INTRAVENOUS | Status: AC
  Filled 2020-02-26: qty 50

## 2020-02-26 NOTE — Patient Instructions (Signed)
[facility] Discharge Instructions for Patients Receiving Chemotherapy  Today you received the following chemotherapy agents Epirubicin; Cytoxin  To help prevent nausea and vomiting after your treatment, we encourage you to take your nausea medication as directed   If you develop nausea and vomiting that is not controlled by your nausea medication, call the clinic.   BELOW ARE SYMPTOMS THAT SHOULD BE REPORTED IMMEDIATELY:  *FEVER GREATER THAN 100.5 F  *CHILLS WITH OR WITHOUT FEVER  NAUSEA AND VOMITING THAT IS NOT CONTROLLED WITH YOUR NAUSEA MEDICATION  *UNUSUAL SHORTNESS OF BREATH  *UNUSUAL BRUISING OR BLEEDING  TENDERNESS IN MOUTH AND THROAT WITH OR WITHOUT PRESENCE OF ULCERS  *URINARY PROBLEMS  *BOWEL PROBLEMS  UNUSUAL RASH Items with * indicate a potential emergency and should be followed up as soon as possible.  Feel free to call the clinic should you have any questions or concerns. The clinic phone number is [phone].  Please show the [facility] at check-in to the Emergency Department and triage nurse.  Epirubicin injection Qu es este medicamento? La EPIRUBICINA es un agente quimioteraputico. Se utiliza para el tratamiento del cncer de mama. Este medicamento puede ser utilizado para otros usos; si tiene alguna pregunta consulte con su proveedor de atencin mdica o con su farmacutico. MARCAS COMUNES: Ell[place] Qu le debo informar a mi profesional de la salud antes de tomar este medicamento? Necesita saber si usted presenta alguno de los [employer] o situaciones:  trastornos sanguneos  enfermedad cardiaca, ataque cardiaco reciente  infeccin (especialmente infecciones virales, como varicela o herpes)  latidos cardiacos irregulares  enfermedad renal  enfermedad heptica  radioterapia reciente o continuada  una reaccin alrgica o inusual a la epirubicina, a otros agentes quimioteraputicos, a otros medicamentos,  alimentos, colorantes o conservantes  si est embarazada o buscando quedar embarazada  si est amamantando a un beb Cmo debo utilizar este medicamento? Este medicamento se administra mediante infusin por va intravenosa. Lo administra un profesional de la salud calificado en un hospital o en un entorno clnico. Si experimenta dolor, hinchazn, ardor o cualquier sensacin inusual alrededor del lugar de la inyeccin, informe inmediatamente a su profesional de [employer]	[place]. Hable con su pediatra para informarse acerca del uso de este medicamento en nios. Puede requerir atencin especial. Sobredosis: Pngase en contacto inmediatamente con un centro toxicolgico o una sala de urgencia si usted cree que haya tomado demasiado medicamento. ATENCIN: [employer] es solo para usted. No comparta este medicamento con nadie. Qu sucede si me olvido de una dosis? Es importante no olvidar ninguna dosis. Informe a su mdico o a su profesional de la salud si no puede asistir a Photographer. Qu puede interactuar con este medicamento? cimetidina ciclosporina docetaxel medicamentos para la presin sangunea, tales como amlodipino, felodipino y nifedipino medicamentos para aumentar los recuentos sanguneos, tales como filgrastim, pegfilgrastim, sargramostim paclitaxel vacunas Hable con su mdico o profesional de la salud antes de tomar cualquiera de estos medicamentos: acetaminofeno (paracetamol) aspirina ibuprofeno ketoprofeno naproxeno Puede ser que esta lista no menciona todas las posibles interacciones. Informe a su profesional de [employer]	[place] de [employer] productos a base de hierbas, medicamentos de [place] o suplementos nutritivos que est tomando. Si usted fuma, consume bebidas alcohlicas o si utiliza drogas ilegales, indqueselo tambin a su profesional de [employer]	[place]. Algunas sustancias pueden interactuar con su medicamento. A qu debo estar atento al usar [employer]? Se supervisar su condicin  atentamente mientras reciba este medicamento. Tendr que hacerse anlisis de  sangre peridicos mientras est tomando [employer]. Este medicamento puede hacerle sentir un Nurse, mental health. Esto es normal ya que la quimioterapia afecta tanto a las clulas sanas como a las clulas cancerosas. Si presenta alguno de los [employer], infrmelos. Sin embargo, contine con el tratamiento aun si se siente enfermo, a menos que su mdico le indique que lo suspenda. Despus de varios das de [place], [place] orina puede ser de color rojo. Esto no significa sangre en la orina. Si su orina es un color oscuro o Forensic psychologist, comunquese con su mdico. Consulte a su mdico o a su profesional de la salud por asesoramiento si tiene fiebre, escalofros, dolor de garganta o cualquier otro sntoma de resfro o gripe. No se trate usted mismo. Este medicamento puede reducir la capacidad del cuerpo para combatir infecciones. Trate de no acercarse a personas que estn enfermas. [employer] puede aumentar el riesgo de magulladuras o sangrado. Consulte a su mdico o a su profesional de la salud si observa sangrados inusuales. Proceda con cuidado al cepillar sus dientes, usar hilo dental o Risk manager palillos para los dientes, ya que puede contraer una infeccin o Therapist, art con mayor facilidad. Si se somete a algn tratamiento dental, informe a su dentista que est [employer]. Evite tomar productos que contienen aspirina, acetaminofeno, ibuprofeno, naproxeno o quetoprofeno a menos que as lo indique su mdico. Estos productos pueden disimular la fiebre. Los hombres y las mujeres en edad de procrear deben Risk manager mtodos anticonceptivos eficaces mientras reciben este medicamento. No se debe quedar embarazada mientras est tomando [employer]. Existe la posibilidad de efectos secundarios graves a un beb sin nacer. Para ms informacin hable con su profesional de la salud o su farmacutico. No debe amamantar a  un beb mientras est tomando este medicamento. Existe una cantidad mxima de este medicamento que debe recibir a lo largo de su vida. La cantidad depende de la afeccin mdica siendo tratado y su salud en general. Su mdico observar la cantidad de este medicamento que usted recibe en su vida. Informe a su mdico si usted ha tomado este medicamento antes. Qu efectos secundarios puedo tener al [employer] este medicamento? Efectos secundarios que debe informar a su mdico o a Barrister's clerk de la salud tan pronto como sea posible:  Chief of Staff como erupcin cutnea, picazn o urticarias, hinchazn de la cara, labios o lengua  conteos sanguneos bajos - este medicamento puede reducir la cantidad de glbulos blancos, glbulos rojos y plaquetas. Su riesgo de infeccin y [place].  signos de infeccin - fiebre o escalofros, tos, dolor de garganta, Social research officer, government o dificultad para orinar  signos de reduccin de plaquetas o sangrado - magulladuras, puntos rojos en la piel, heces de color oscuro o con aspecto alquitranado, sangre en la orina  signos de reduccin de glbulos rojos - cansancio o debilidad inusual, desmayos, sensacin de Lobbyist en el pecho  dolor de gota  pulso cardiaco rpido, irregular  llagas en la boca  dolor, enrojecimiento, hinchazn en el lugar de la inyeccin  hinchazn de tobillos, pies o manos Efectos secundarios que, por lo general, no requieren atencin mdica (debe informarlos a su mdico o a su profesional de la salud si persisten o si son molestos):  cambio en el color de la piel o las uas  diarrea  cada del cabello  sofocos, enrojecimiento de la cara  aumento de la sensibilidad de la piel al sol  prdida del apetito  nuseas, vmito  orina de color rojizo  [employer] Puede ser que esta lista no menciona todos los posibles efectos secundarios. Comunquese a su mdico por asesoramiento mdico  [employer] Usted puede informar los efectos secundarios a la FDA por telfono al 1-800-FDA-1088. Dnde debo guardar mi medicina? Este medicamento se administra en hospitales o clnicas y no necesitar guardarlo en su domicilio. ATENCIN: Este folleto es un resumen. Puede ser que no cubra toda la posible informacin. Si usted tiene preguntas acerca de esta medicina, consulte con su mdico, su farmacutico o su profesional de Technical sales engineer.  2020 Elsevier/Gold Standard (2017-08-07 00:00:00)

## 2020-02-26 NOTE — Progress Notes (Signed)
Nutrition Assessment  Reason for Assessment:  Pt attended Breast Clinic and was given nutrition packet by nurse navigator.    ASSESSMENT:  44 year old female with triple negative breast cancer.  Past medical history reviewed. Patient receiving neoadjuvant chemotherapy.    Met with patient during infusion with interpreter [name] present during interview.  Patient reports that day after chemotherapy she did not eat well but otherwise has been eating well.  Foods that she has been including in her diet are chicken, egg whites, beans, rice, fruits, vegetables.  She has been trying to drink a lot of water.     Medications:  Omeprezole, miralax, compazine, ativan  Labs: reviewed   Anthropometrics:   Height: 62 inches Weight: 193 lb 6/9 BMI: 35   NUTRITION DIAGNOSIS: Food and nutrition related knowledge deficit related to new diagnosis of breast cancer as evidenced by no prior need for nutrition related information.  INTERVENTION:   Encouraged well-balanced diet including good sources of protein. Contact information provided and patient knows to contact me with questions/concerns.    MONITORING, EVALUATION, and GOAL: Pt will consume a healthy plant based diet to maintain lean body mass throughout treatment.   Kielee Care B. Zenia Resides, [place], [place] Registered Dietitian [phone] (pager)

## 2020-02-26 NOTE — Progress Notes (Signed)
02/26/20  Added Pepcid 20 mg IVPB and diphenhyydramine 25 mg IVP to premedications due to reactions with cycle #1.  Changing to epirubicin due to doxorubicin infusion reaction with cycle #1.  T.O. Dr Magrinat/Mansoor Hillyard Ronnald Ramp, PharmD

## 2020-02-28 ENCOUNTER — Inpatient Hospital Stay: Payer: Self-pay

## 2020-02-28 ENCOUNTER — Other Ambulatory Visit: Payer: Self-pay

## 2020-02-28 VITALS — BP 111/57 | HR 71 | Temp 98.1°F | Resp 18

## 2020-02-28 DIAGNOSIS — C50412 Malignant neoplasm of upper-outer quadrant of left female breast: Secondary | ICD-10-CM

## 2020-02-28 MED ORDER — PEGFILGRASTIM-CBQV 6 MG/0.6ML ~~LOC~~ SOSY
PREFILLED_SYRINGE | SUBCUTANEOUS | Status: AC
  Filled 2020-02-28: qty 0.6

## 2020-02-28 MED ORDER — PEGFILGRASTIM-CBQV 6 MG/0.6ML ~~LOC~~ SOSY
6.0000 mg | PREFILLED_SYRINGE | Freq: Once | SUBCUTANEOUS | Status: AC
  Administered 2020-02-28: 6 mg via SUBCUTANEOUS

## 2020-03-04 ENCOUNTER — Inpatient Hospital Stay (HOSPITAL_BASED_OUTPATIENT_CLINIC_OR_DEPARTMENT_OTHER): Payer: Self-pay | Admitting: Adult Health

## 2020-03-04 ENCOUNTER — Other Ambulatory Visit: Payer: Self-pay

## 2020-03-04 ENCOUNTER — Inpatient Hospital Stay: Payer: Self-pay

## 2020-03-04 VITALS — BP 107/55 | HR 78 | Temp 97.4°F | Resp 18 | Ht 62.0 in | Wt 192.7 lb

## 2020-03-04 DIAGNOSIS — Z170 Estrogen receptor positive status [ER+]: Secondary | ICD-10-CM

## 2020-03-04 DIAGNOSIS — C50412 Malignant neoplasm of upper-outer quadrant of left female breast: Secondary | ICD-10-CM

## 2020-03-04 LAB — COMPREHENSIVE METABOLIC PANEL
ALT: 22 U/L (ref 0–44)
AST: 10 U/L — ABNORMAL LOW (ref 15–41)
Albumin: 3.9 g/dL (ref 3.5–5.0)
Alkaline Phosphatase: 121 U/L (ref 38–126)
Anion gap: 8 (ref 5–15)
BUN: 20 mg/dL (ref 6–20)
CO2: 27 mmol/L (ref 22–32)
Calcium: 9.2 mg/dL (ref 8.9–10.3)
Chloride: 103 mmol/L (ref 98–111)
Creatinine, Ser: 0.71 mg/dL (ref 0.44–1.00)
GFR calc Af Amer: >60 mL/min (ref >60)
GFR calc non Af Amer: >60 mL/min (ref >60)
Glucose, Bld: 98 mg/dL (ref 70–99)
Potassium: 3.6 mmol/L (ref 3.5–5.1)
Sodium: 138 mmol/L (ref 135–145)
Total Bilirubin: 0.3 mg/dL (ref 0.3–1.2)
Total Protein: 6.6 g/dL (ref 6.5–8.1)

## 2020-03-04 LAB — CBC WITH DIFFERENTIAL/PLATELET
Abs Immature Granulocytes: 0.09 10*3/uL — ABNORMAL HIGH (ref 0.00–0.07)
Basophils Absolute: 0.1 10*3/uL (ref 0.0–0.1)
Basophils Relative: 1 %
Eosinophils Absolute: 0 10*3/uL (ref 0.0–0.5)
Eosinophils Relative: 0 %
HCT: 33.6 % — ABNORMAL LOW (ref 36.0–46.0)
Hemoglobin: 11 g/dL — ABNORMAL LOW (ref 12.0–15.0)
Immature Granulocytes: 2 %
Lymphocytes Relative: 44 %
Lymphs Abs: 1.7 10*3/uL (ref 0.7–4.0)
MCH: 27.7 pg (ref 26.0–34.0)
MCHC: 32.7 g/dL (ref 30.0–36.0)
MCV: 84.6 fL (ref 80.0–100.0)
Monocytes Absolute: 0.3 10*3/uL (ref 0.1–1.0)
Monocytes Relative: 6 %
Neutro Abs: 1.8 10*3/uL (ref 1.7–7.7)
Neutrophils Relative %: 47 %
Platelets: 195 10*3/uL (ref 150–400)
RBC: 3.97 MIL/uL (ref 3.87–5.11)
RDW: 13.6 % (ref 11.5–15.5)
WBC: 3.9 10*3/uL — ABNORMAL LOW (ref 4.0–10.5)
nRBC: 0 % (ref 0.0–0.2)

## 2020-03-04 NOTE — Progress Notes (Signed)
[facility]  [phone] [phone]     ID: [name] DOB: [birth year]  MR#: [id number]  AVW#:[id number]  Patient Care Team: Rutherford Guys, MD as PCP - General (Family Medicine) Rolm Bookbinder, MD as Consulting Physician (General Surgery) Magrinat, Virgie Dad, MD as Consulting Physician (Oncology) Eppie Gibson, MD as Attending Physician (Radiation Oncology) Rockwell Germany, RN as Oncology Nurse Navigator Mauro Kaufmann, RN as Oncology Nurse Navigator Scot Dock, NP OTHER MD:  CHIEF COMPLAINT: Triple negative breast cancer  CURRENT TREATMENT: Neoadjuvant chemotherapy   INTERVAL HISTORY: [name] returns today for follow up and treatment of her triple negative breast cancer.  [name] is accompanied by our [place] interpreter.  [name] is undergoing neoadjuvant chemotherapy with dose dense Doxorubicin and Cyclophosphamide given every 2 weeks x 4 cycles, to be followed by weekly Paclitaxel and Carboplatin x 12 cycles.    Today is cycle 3 day 8 of treatment.     REVIEW OF SYSTEMS: [name] notes that [name] tolerated her third cycle of neoadjuvant chemotherapy with Doxorubicin and Cyclophosphamide very well.  [name] denies any mouth sores or ulcerations.  [name] is eating and drinking well.  [name] denies any activity changes and does housework around her house and spends time with her mom.    [name] denies any fever, chills, chest pain, palpitations, cough, shortness of breath, nausea, vomiting, bowel/bladder changes or any other concerns.  [name] notes her breast mass continues to shrink and [name] is unsure if [name] can feel it or not.  A detailed ROS was otherwise non contributory.    HISTORY OF CURRENT ILLNESS: From the original intake note:  [name] herself palpated a left beast lump with associated pain. [name] underwent bilateral diagnostic mammography with tomography and left breast ultrasonography at [facility] on 01/23/2020 showing: breast  density category B; palpable 3.6 cm left breast mass with associated calcifications at 1 o'clock; 0.8 cm left breast mass at 2 o'clock, concerning for abnormal intramammary lymph node; at least two abnormal left axillary lymph nodes.  Accordingly on 01/23/2020 [name] proceeded to biopsy of the left breast area in question. The pathology from this procedure ([id number]) showed: invasive ductal carcinoma at 1 o'clock, grade 3. Prognostic indicators significant for: estrogen receptor, 20% positive with moderate staining intensity and progesterone receptor, 0% negative. Proliferation marker Ki67 at 40%. HER2 negative by immunohistochemistry (1+).  The left axillary lymph node confirmed metastatic carcinoma.  The left intramammary lymph node was negative for carcinoma.  The patient's subsequent history is as detailed below.   PAST MEDICAL HISTORY: Past Medical History:  Diagnosis Date  . Allergy   . Asthma   . Chronic back pain   . Hx of migraines   . Medical history non-contributory     PAST SURGICAL HISTORY: Past Surgical History:  Procedure Laterality Date  . CESAREAN SECTION WITH BILATERAL TUBAL LIGATION Bilateral 07/23/2015   Procedure: CESAREAN SECTION WITH BILATERAL TUBAL LIGATION;  Surgeon: Donnamae Jude, MD;  Location: [facility];  Service: Obstetrics;  Laterality: Bilateral;  . PORTACATH PLACEMENT Right 02/11/2020   Procedure: INSERTION PORT-A-CATH WITH ULTRASOUND GUIDANCE;  Surgeon: Rolm Bookbinder, MD;  Location: [facility];  Service: General;  Laterality: Right;  . TUBAL LIGATION      FAMILY HISTORY: No family history on file.  Her parents are both living as of 01/2020, her father age 40 and her mother age 6. [name] has three maternal half-siblings (1 sister, 2 brothers) and four paternal  half-siblings (1 sister, 3 brothers). There is no family history of cancer to her knowledge.   GYNECOLOGIC HISTORY:  Patient's last menstrual period was 01/20/2020 (exact  date). Menarche: 44 years old Age at first live birth: 44 years old Rogers P 3 LMP 01/2020 Contraceptive s/p tubal ligation HRT n/a  Hysterectomy? no BSO? 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 Husband Vaughan Sine works for a Financial trader, both in [employer]. They are both from [place]. [name] lives at home with Eunola and their three children-- [name], [place], and [name]. [name] attends an [employer].    ADVANCED DIRECTIVES: In the absence of any documentation to the contrary, the patient's spouse is their HCPOA.    HEALTH MAINTENANCE: Social History   Tobacco Use  . Smoking status: Never Smoker  . Smokeless tobacco: Never Used  Vaping Use  . Vaping Use: Never used  Substance Use Topics  . Alcohol use: No  . Drug use: No     Colonoscopy: n/a (age)  PAP: 11/2017, negative (per patient)  Bone density: n/a (age)   Allergies  Allergen Reactions  . Doxorubicin Hcl Rash    Experience chest tightness, abdominal pain, and redness around lips during and shortly after doxorubicin bolus.     Current Outpatient Medications  Medication Sig Dispense Refill  . albuterol (VENTOLIN HFA) 108 (90 Base) MCG/ACT inhaler Inhale into the lungs every 6 (six) hours as needed for wheezing or shortness of breath.    . dexamethasone (DECADRON) 4 MG tablet Take 2 tablets (8 mg) by mouth twice daily with food starting the day after chemotherapy (day 2), continue day3, take the last dose the morning of day 4 30 tablet 1  . ibuprofen (ADVIL,MOTRIN) 800 MG tablet Take 1 tablet (800 mg total) by mouth 3 (three) times daily. 21 tablet 0  . lidocaine-prilocaine (EMLA) cream Apply to affected area once 30 g 3  . loratadine (CLARITIN) 10 MG tablet Take 1 tablet (10 mg total) by mouth daily. 60 tablet 0  . LORazepam (ATIVAN) 0.5 MG tablet Take 1 tablet at bedtime on days 1 ,2 and 3 of each chemotherapy  treatment, then at bedtime as needed 30 tablet 0  . oxyCODONE (OXY IR/ROXICODONE) 5 MG immediate release tablet Take 1 tablet (5 mg total) by mouth every 6 (six) hours as needed. 10 tablet 0  . pantoprazole (PROTONIX) 40 MG tablet Take 1 tablet (40 mg total) by mouth daily. 30 tablet 1  . prochlorperazine (COMPAZINE) 10 MG tablet Take 1 tablet (10 mg total) by mouth 3 (three) times daily before meals for 3 days. 30 tablet 1   No current facility-administered medications for this visit.    OBJECTIVE: [name] who appears stated age  24:   03/04/20 1550  BP: (!) 107/55  Pulse: 78  Resp: 18  Temp: (!) 97.4 F (36.3 C)  SpO2: 99%     Body mass index is 35.25 kg/m.   Wt Readings from Last 3 Encounters:  03/04/20 192 lb 11.2 oz (87.4 kg)  02/19/20 193 lb 12.8 oz (87.9 kg)  02/11/20 196 lb 10.4 oz (89.2 kg)      ECOG FS:1 - Symptomatic but completely ambulatory GENERAL: Patient is a well appearing female in no acute distress HEENT:  Sclerae anicteric.  Oropharynx clear and moist. No ulcerations or evidence of oropharyngeal candidiasis. Neck is supple.  NODES:  No cervical, supraclavicular, or  axillary lymphadenopathy palpated.  BREAST EXAM:  Difficulty palpating left breast mass, no progression noted LUNGS:  Clear to auscultation bilaterally.  No wheezes or rhonchi. HEART:  Regular rate and rhythm. No murmur appreciated. ABDOMEN:  Soft, nontender.  Positive, normoactive bowel sounds. No organomegaly palpated. MSK:  No focal spinal tenderness to palpation. Full range of motion bilaterally in the upper extremities. EXTREMITIES:  No peripheral edema.   SKIN:  Clear with no obvious rashes or skin changes. No nail dyscrasia. NEURO:  Nonfocal. Well oriented.  Appropriate affect.     LAB RESULTS:  CMP     Component Value Date/Time   NA 138 03/04/2020 1530   NA 138 08/15/2017 1525   K 3.6 03/04/2020 1530   CL 103 03/04/2020 1530   CO2 27 03/04/2020 1530   GLUCOSE 98  03/04/2020 1530   BUN 20 03/04/2020 1530   BUN 12 08/15/2017 1525   CREATININE 0.71 03/04/2020 1530   CREATININE 0.77 02/26/2020 0915   CALCIUM 9.2 03/04/2020 1530   PROT 6.6 03/04/2020 1530   PROT 7.4 01/03/2017 1642   ALBUMIN 3.9 03/04/2020 1530   ALBUMIN 4.3 01/03/2017 1642   AST 10 (L) 03/04/2020 1530   AST 17 02/26/2020 0915   ALT 22 03/04/2020 1530   ALT 21 02/26/2020 0915   ALKPHOS 121 03/04/2020 1530   BILITOT 0.3 03/04/2020 1530   BILITOT <0.2 (L) 02/26/2020 0915   GFRNONAA >60 03/04/2020 1530   GFRNONAA >60 02/26/2020 0915   GFRAA >60 03/04/2020 1530   GFRAA >60 02/26/2020 0915    No results found for: TOTALPROTELP, ALBUMINELP, A1GS, A2GS, BETS, BETA2SER, GAMS, MSPIKE, SPEI  Lab Results  Component Value Date   WBC 3.9 (L) 03/04/2020   NEUTROABS 1.8 03/04/2020   HGB 11.0 (L) 03/04/2020   HCT 33.6 (L) 03/04/2020   MCV 84.6 03/04/2020   PLT 195 03/04/2020    No results found for: LABCA2  No components found for: [id number]  No results for input(s): INR in the last 168 hours.  No results found for: LABCA2  No results found for: WUJ811  No results found for: BJY782  No results found for: NFA213  No results found for: CA2729  No components found for: HGQUANT  No results found for: CEA1 / No results found for: CEA1   No results found for: AFPTUMOR  No results found for: CHROMOGRNA  No results found for: KPAFRELGTCHN, LAMBDASER, KAPLAMBRATIO (kappa/lambda light chains)  No results found for: HGBA, HGBA2QUANT, HGBFQUANT, HGBSQUAN (Hemoglobinopathy evaluation)   No results found for: LDH  No results found for: IRON, TIBC, IRONPCTSAT (Iron and TIBC)  No results found for: FERRITIN  Urinalysis No results found for: COLORURINE, APPEARANCEUR, LABSPEC, PHURINE, GLUCOSEU, HGBUR, BILIRUBINUR, KETONESUR, PROTEINUR, UROBILINOGEN, NITRITE, LEUKOCYTESUR   STUDIES: CT CHEST W CONTRAST  Result Date: 02/07/2020 CLINICAL DATA:  Newly diagnosed left  breast cancer EXAM: CT CHEST WITH CONTRAST TECHNIQUE: Multidetector CT imaging of the chest was performed during intravenous contrast administration. CONTRAST:  61m OMNIPAQUE IOHEXOL 300 MG/ML  SOLN COMPARISON:  None. FINDINGS: Cardiovascular: Heart is normal in size.  No pericardial effusion. No evidence of thoracic aortic aneurysm. Mediastinum/Nodes: No suspicious mediastinal or hilar lymphadenopathy. Dominant 2.5 cm left axillary node (series 2/image 46), compatible with nodal metastasis. Additional left axillary nodes. 7 mm short axis left subpectoral node (series 2/image 28), also suspicious. Lungs/Pleura: No suspicious pulmonary nodules. No focal consolidation. No pleural effusion or pneumothorax. Upper Abdomen: Visualized upper abdomen is grossly unremarkable. Musculoskeletal: Mild degenerative changes  of the visualized thoracolumbar spine. Correlate with pending bone scan. Dominant 2.5 x 3.7 cm mass in the upper left breast (series 2/image 43), likely corresponding to the patient's newly diagnosed breast cancer. Small nodules/nodes in the lateral left breast (series 2/image 4), indeterminate. IMPRESSION: Dominant 3.7 cm mass in the upper left breast, likely corresponding to the patient's newly diagnosed breast cancer. Small nodules/nodes in the lateral left breast, indeterminate. Dominant 2.5 cm left axillary node, compatible with nodal metastases. Additional left axillary nodes and 7 mm short axis left subpectoral node, also suspicious. No evidence of distant metastases. No suspicious osseous lesions on CT. Correlate with pending bone scan. Electronically Signed   By: Julian Hy M.D.   On: 02/07/2020 12:06   NM Bone Scan Whole Body  Result Date: 02/07/2020 CLINICAL DATA:  Breast cancer.  Bone pain. EXAM: NUCLEAR MEDICINE WHOLE BODY BONE SCAN TECHNIQUE: Whole body anterior and posterior images were obtained approximately 3 hours after intravenous injection of radiopharmaceutical.  RADIOPHARMACEUTICALS:  21.6 mCi Technetium-21mMDP IV COMPARISON:  Chest CT same day FINDINGS: No abnormal radiotracer accumulation within the axillary appendicular skeleton to suggest metastatic disease. Degenerate uptake noted in the shoulders and knees. IMPRESSION: No scintigraphic evidence skeletal metastasis. Electronically Signed   By: SSuzy BouchardM.D.   On: 02/07/2020 17:05   MR BREAST BILATERAL W WO CONTRAST INC CAD  Result Date: 02/13/2020 CLINICAL DATA:  44year old female with recently diagnosed grade 3 invasive ductal carcinoma post ultrasound-guided biopsy of a 3.6 cm mass in the left breast at the 1 o'clock position. An additional intramammary lymph node in the left breast at the 2 o'clock position was biopsied and negative for carcinoma however considered discordant with excision recommended. Abnormal lymph node in the left axilla was biopsied with pathology demonstrating metastatic carcinoma. LABS:  Not applicable. EXAM: BILATERAL BREAST MRI WITH AND WITHOUT CONTRAST TECHNIQUE: Multiplanar, multisequence MR images of both breasts were obtained prior to and following the intravenous administration of 9 ml of Gadavist Three-dimensional MR images were rendered by post-processing of the original MR data on an independent workstation. The three-dimensional MR images were interpreted, and findings are reported in the following complete MRI report for this study. Three dimensional images were evaluated at the independent DynaCad workstation COMPARISON:  Previous exams. FINDINGS: Breast composition: b.  Scattered fibroglandular tissue. Background parenchymal enhancement: Mild. Right breast: No suspicious rapidly enhancing masses or abnormal areas of enhancement in the right breast to suggest malignancy. Left breast: Irregular enhancing mass at site of biopsy proven malignancy in the upper-outer left breast anterior to mid depth measures 6 cm AP, 3.1 cm transverse and 2.3 cm craniocaudal. The 0.8 cm  previously biopsied intramammary lymph node in the upper-outer left breast demonstrates cortical thickening and remains suspicious for metastases despite benign pathology results. No additional enhancing masses or abnormal areas of enhancement identified in the left breast to suggest additional sites of disease. Lymph nodes: Three abnormal left level I axillary nodes are visualized, the largest of which was previously biopsied measuring 3 cm and appears partially necrotic. No internal mammary lymphadenopathy. Ancillary findings:  None. IMPRESSION: 1. Biopsy proven malignancy in the upper-outer left breast measures 6 x 3.1 x 2.3 cm. No additional enhancing masses or abnormal areas of enhancement identified in the left breast to suggest additional sites of disease. 2. Previously biopsied 0.8 cm intramammary lymph node in the upper-outer left breast remains suspicious for metastatic disease despite benign pathology results, with excision recommended. 3. Three morphologically abnormal left level  1 axillary lymph nodes, the largest of which was previously biopsied and measures up to 3 cm. 4.  No MRI evidence of malignancy in the right breast. RECOMMENDATION: Treatment plan for known left breast malignancy. BI-RADS CATEGORY  6: Known biopsy-proven malignancy. Electronically Signed   By: Everlean Alstrom M.D.   On: 02/13/2020 15:08   DG CHEST PORT 1 VIEW  Result Date: 02/11/2020 CLINICAL DATA:  Port-A-Cath placement. EXAM: PORTABLE CHEST 1 VIEW COMPARISON:  Jan 12, 2018. FINDINGS: The heart size and mediastinal contours are within normal limits. Both lungs are clear. Interval placement of right internal jugular Port-A-Cath with distal tip in expected position of cavoatrial junction. No pneumothorax or pleural effusion is noted. The visualized skeletal structures are unremarkable. IMPRESSION: Interval placement of right internal jugular Port-A-Cath. No pneumothorax is noted. Electronically Signed   By: Marijo Conception  M.D.   On: 02/11/2020 13:55   DG Fluoro Guide CV Line-No Report  Result Date: 02/11/2020 Fluoroscopy was utilized by the requesting physician.  No radiographic interpretation.   ECHOCARDIOGRAM COMPLETE  Result Date: 02/07/2020    ECHOCARDIOGRAM REPORT   Patient Name:   [name] Date of Exam: 02/07/2020 Medical Rec #:  [id number]       Height:       62.0 in Accession #:    [id number]      Weight:       196.0 lb Date of Birth:  1975/12/28       BSA:          1.895 m Patient Age:    45 years        BP:           127/82 mmHg Patient Gender: F               HR:           62 bpm. Exam Location:  Outpatient Procedure: 2D Echo, Cardiac Doppler, Color Doppler and Strain Analysis Indications:    Z51.11 Encounter for antineoplastic chemotheraphy  History:        Patient has no prior history of Echocardiogram examinations.  Sonographer:    Jonelle Sidle Dance Referring Phys: [place]  1. Left ventricular ejection fraction, by estimation, is 55 to 60%. The left ventricle has normal function. The left ventricle has no regional wall motion abnormalities. Left ventricular diastolic parameters were normal.  2. Right ventricular systolic function is normal. The right ventricular size is normal.  3. The mitral valve is normal in structure. No evidence of mitral valve regurgitation. No evidence of mitral stenosis.  4. The aortic valve is normal in structure. Aortic valve regurgitation is not visualized. No aortic stenosis is present. FINDINGS  Left Ventricle: Left ventricular ejection fraction, by estimation, is 55 to 60%. The left ventricle has normal function. The left ventricle has no regional wall motion abnormalities. The left ventricular internal cavity size was normal in size. There is  no left ventricular hypertrophy. Left ventricular diastolic parameters were normal. Right Ventricle: The right ventricular size is normal. No increase in right ventricular wall thickness. Right ventricular systolic  function is normal. Left Atrium: Left atrial size was normal in size. Right Atrium: Right atrial size was normal in size. Pericardium: There is no evidence of pericardial effusion. Mitral Valve: The mitral valve is normal in structure. No evidence of mitral valve regurgitation. No evidence of mitral valve stenosis. Tricuspid Valve: The tricuspid valve is grossly normal. Tricuspid valve regurgitation is trivial. No  evidence of tricuspid stenosis. Aortic Valve: The aortic valve is normal in structure. Aortic valve regurgitation is not visualized. No aortic stenosis is present. Pulmonic Valve: The pulmonic valve was normal in structure. Pulmonic valve regurgitation is not visualized. Aorta: The aortic root and ascending aorta are structurally normal, with no evidence of dilitation. IAS/Shunts: The atrial septum is grossly normal.  LEFT VENTRICLE PLAX 2D LVIDd:         4.20 cm  Diastology LVIDs:         3.00 cm  LV e' lateral:   11.70 cm/s LV PW:         0.90 cm  LV E/e' lateral: 8.0 LV IVS:        0.80 cm  LV e' medial:    10.15 cm/s LVOT diam:     1.70 cm  LV E/e' medial:  9.3 LV SV:         45 LV SV Index:   24 LVOT Area:     2.27 cm  RIGHT VENTRICLE             IVC RV Basal diam:  2.20 cm     IVC diam: 1.40 cm RV S prime:     10.90 cm/s TAPSE (M-mode): 1.7 cm LEFT ATRIUM             Index       RIGHT ATRIUM          Index LA diam:        3.10 cm 1.64 cm/m  RA Area:     9.40 cm LA Vol (A2C):   57.5 ml 30.34 ml/m RA Volume:   16.40 ml 8.65 ml/m LA Vol (A4C):   21.8 ml 11.50 ml/m LA Biplane Vol: 35.5 ml 18.73 ml/m  AORTIC VALVE LVOT Vmax:   91.40 cm/s LVOT Vmean:  60.500 cm/s LVOT VTI:    0.197 m  AORTA Ao Root diam: 3.00 cm Ao Asc diam:  3.00 cm MITRAL VALVE MV Area (PHT): 4.49 cm    SHUNTS MV Decel Time: 169 msec    Systemic VTI:  0.20 m MV E velocity: 94.00 cm/s  Systemic Diam: 1.70 cm MV A velocity: 67.70 cm/s MV E/A ratio:  1.39 Mertie Moores MD Electronically signed by Mertie Moores MD Signature  Date/Time: 02/07/2020/10:37:03 AM    Final      ELIGIBLE FOR AVAILABLE RESEARCH PROTOCOL: [id number]?  ASSESSMENT: 44 y.o.  Thomas status post left breast upper outer quadrant biopsy 01/23/2020 for a clinical T3 N1, 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  (a) chest CT scan and bone scan 02/07/2020 showed no evidence of metastatic disease  (1) neoadjuvant chemotherapy will consist of doxorubicin and cyclophosphamide in dose dense fashion x4 starting 02/12/2020 to be followed by paclitaxel and carboplatin weekly x12  (a) echo 02/07/2020 shows an ejection fraction in the 55-60% range  (2) definitive surgery to follow  (3) adjuvant radiation  (4) genetics testing  (a) Negative genetic testing. No pathogenic variants identified on the [facility] Common Hereditary Cancers Panel. VUS in MSH6 called c.2744C>G identified. The report date is 02/07/2020.  The Common Hereditary Cancers Panel offered by [facility] includes sequencing and/or deletion duplication testing of the following 48 genes: APC, ATM, AXIN2, BARD1, BMPR1A, BRCA1, BRCA2, BRIP1, CDH1, CDKN2A (p14ARF), CDKN2A (p16INK4a), CKD4, CHEK2, CTNNA1, DICER1, EPCAM (Deletion/duplication testing only), GREM1 (promoter region deletion/duplication testing only), KIT, MEN1, MLH1, MSH2, MSH3, MSH6, MUTYH, NBN, NF1, NHTL1, PALB2, PDGFRA,  PMS2, POLD1, POLE, PTEN, RAD50, RAD51C, RAD51D, RNF43, SDHB, SDHC, SDHD, SMAD4, SMARCA4. STK11, TP53, TSC1, TSC2, and VHL.  The following genes were evaluated for sequence changes only: SDHA and HOXB13 c.251G>A variant only.   PLAN: Alla is doing well today following her third of four treatments with neoadjuvant Doxorubicin and Cyclophosphamide.  [name] is not neutropenic.  [name] is feeling better today than [name] did with her last treatment.  I reviewed her labs with her.    Her breast appears to be responding to her treatment, which is promising.  I recommended that  [name] continue with healthy diet and exercise.   Jalayiah will return in 1 week for labs, f/u, and her next treatment.  [name] knows to call for any questions that may arise between now and her next appointment.  We are happy to see her sooner if needed.   Total encounter time 20 minutes*  Wilber Bihari, NP 03/06/20 7:06 PM Medical Oncology and Hematology [facility] [place], [place] [address] Tel. [phone]    Fax. [phone]   *Total Encounter Time as defined by the Centers for Medicare and Medicaid Services includes, in addition to the face-to-face time of a patient visit (documented in the note above) non-face-to-face time: obtaining and reviewing outside history, ordering and reviewing medications, tests or procedures, care coordination (communications with other health care professionals or caregivers) and documentation in the medical record.

## 2020-03-05 ENCOUNTER — Telehealth: Payer: Self-pay | Admitting: Adult Health

## 2020-03-05 NOTE — Telephone Encounter (Signed)
No 6/23 los. No changes made to pt's schedule.

## 2020-03-06 ENCOUNTER — Encounter: Payer: Self-pay | Admitting: Adult Health

## 2020-03-11 ENCOUNTER — Encounter: Payer: Self-pay | Admitting: Oncology

## 2020-03-11 ENCOUNTER — Inpatient Hospital Stay: Payer: Self-pay

## 2020-03-11 ENCOUNTER — Encounter: Payer: Self-pay | Admitting: Adult Health

## 2020-03-11 ENCOUNTER — Other Ambulatory Visit: Payer: Self-pay

## 2020-03-11 ENCOUNTER — Inpatient Hospital Stay (HOSPITAL_BASED_OUTPATIENT_CLINIC_OR_DEPARTMENT_OTHER): Payer: Self-pay | Admitting: Adult Health

## 2020-03-11 ENCOUNTER — Telehealth: Payer: Self-pay

## 2020-03-11 VITALS — BP 118/52 | HR 81 | Temp 98.2°F | Resp 20 | Ht 62.0 in | Wt 191.7 lb

## 2020-03-11 DIAGNOSIS — C50412 Malignant neoplasm of upper-outer quadrant of left female breast: Secondary | ICD-10-CM

## 2020-03-11 DIAGNOSIS — Z170 Estrogen receptor positive status [ER+]: Secondary | ICD-10-CM

## 2020-03-11 DIAGNOSIS — Z95828 Presence of other vascular implants and grafts: Secondary | ICD-10-CM

## 2020-03-11 LAB — CBC WITH DIFFERENTIAL/PLATELET
Abs Immature Granulocytes: 0.5 10*3/uL — ABNORMAL HIGH (ref 0.00–0.07)
Basophils Absolute: 0 10*3/uL (ref 0.0–0.1)
Basophils Relative: 1 %
Eosinophils Absolute: 0 10*3/uL (ref 0.0–0.5)
Eosinophils Relative: 0 %
HCT: 34.8 % — ABNORMAL LOW (ref 36.0–46.0)
Hemoglobin: 11.3 g/dL — ABNORMAL LOW (ref 12.0–15.0)
Immature Granulocytes: 8 %
Lymphocytes Relative: 19 %
Lymphs Abs: 1.1 10*3/uL (ref 0.7–4.0)
MCH: 28.1 pg (ref 26.0–34.0)
MCHC: 32.5 g/dL (ref 30.0–36.0)
MCV: 86.6 fL (ref 80.0–100.0)
Monocytes Absolute: 0.5 10*3/uL (ref 0.1–1.0)
Monocytes Relative: 9 %
Neutro Abs: 3.9 10*3/uL (ref 1.7–7.7)
Neutrophils Relative %: 63 %
Platelets: 164 10*3/uL (ref 150–400)
RBC: 4.02 MIL/uL (ref 3.87–5.11)
RDW: 14.2 % (ref 11.5–15.5)
WBC: 6.1 10*3/uL (ref 4.0–10.5)
nRBC: 0.5 % — ABNORMAL HIGH (ref 0.0–0.2)

## 2020-03-11 LAB — COMPREHENSIVE METABOLIC PANEL
ALT: 22 U/L (ref 0–44)
AST: 16 U/L (ref 15–41)
Albumin: 3.7 g/dL (ref 3.5–5.0)
Alkaline Phosphatase: 106 U/L (ref 38–126)
Anion gap: 9 (ref 5–15)
BUN: 13 mg/dL (ref 6–20)
CO2: 25 mmol/L (ref 22–32)
Calcium: 9.4 mg/dL (ref 8.9–10.3)
Chloride: 106 mmol/L (ref 98–111)
Creatinine, Ser: 0.74 mg/dL (ref 0.44–1.00)
GFR calc Af Amer: >60 mL/min (ref >60)
GFR calc non Af Amer: >60 mL/min (ref >60)
Glucose, Bld: 106 mg/dL — ABNORMAL HIGH (ref 70–99)
Potassium: 4 mmol/L (ref 3.5–5.1)
Sodium: 140 mmol/L (ref 135–145)
Total Bilirubin: <0.2 mg/dL — ABNORMAL LOW (ref 0.3–1.2)
Total Protein: 6.8 g/dL (ref 6.5–8.1)

## 2020-03-11 MED ORDER — PALONOSETRON HCL INJECTION 0.25 MG/5ML
0.2500 mg | Freq: Once | INTRAVENOUS | Status: AC
  Administered 2020-03-11: 0.25 mg via INTRAVENOUS

## 2020-03-11 MED ORDER — MAGIC MOUTHWASH: 5.0000 mL | Freq: Three times a day (TID) | ORAL | 0 refills | Status: DC | PRN

## 2020-03-11 MED ORDER — VALACYCLOVIR HCL 500 MG PO TABS: 500.0000 mg | ORAL_TABLET | Freq: Two times a day (BID) | ORAL | 1 refills | Status: DC

## 2020-03-11 MED ORDER — SODIUM CHLORIDE 0.9% FLUSH
10.0000 mL | INTRAVENOUS | Status: DC | PRN
  Administered 2020-03-11: 10 mL via INTRAVENOUS
  Filled 2020-03-11: qty 10

## 2020-03-11 MED ORDER — SODIUM CHLORIDE 0.9 % IV SOLN
10.0000 mg | Freq: Once | INTRAVENOUS | Status: AC
  Administered 2020-03-11: 10 mg via INTRAVENOUS
  Filled 2020-03-11: qty 10

## 2020-03-11 MED ORDER — HEPARIN SOD (PORK) LOCK FLUSH 100 UNIT/ML IV SOLN
500.0000 [IU] | Freq: Once | INTRAVENOUS | Status: AC | PRN
  Administered 2020-03-11: 500 [IU]
  Filled 2020-03-11: qty 5

## 2020-03-11 MED ORDER — SODIUM CHLORIDE 0.9% FLUSH
10.0000 mL | INTRAVENOUS | Status: DC | PRN
  Administered 2020-03-11: 10 mL
  Filled 2020-03-11: qty 10

## 2020-03-11 MED ORDER — DIPHENHYDRAMINE HCL 50 MG/ML IJ SOLN
INTRAMUSCULAR | Status: AC
  Filled 2020-03-11: qty 1

## 2020-03-11 MED ORDER — FAMOTIDINE IN NACL 20-0.9 MG/50ML-% IV SOLN
INTRAVENOUS | Status: AC
  Filled 2020-03-11: qty 50

## 2020-03-11 MED ORDER — SODIUM CHLORIDE 0.9 % IV SOLN
150.0000 mg | Freq: Once | INTRAVENOUS | Status: AC
  Administered 2020-03-11: 150 mg via INTRAVENOUS
  Filled 2020-03-11: qty 150

## 2020-03-11 MED ORDER — DIPHENHYDRAMINE HCL 50 MG/ML IJ SOLN
25.0000 mg | Freq: Once | INTRAMUSCULAR | Status: AC
  Administered 2020-03-11: 25 mg via INTRAVENOUS

## 2020-03-11 MED ORDER — EPIRUBICIN HCL CHEMO IV INJECTION 200 MG/100ML
75.0000 mg/m2 | Freq: Once | INTRAVENOUS | Status: AC
  Administered 2020-03-11: 148 mg via INTRAVENOUS
  Filled 2020-03-11: qty 74

## 2020-03-11 MED ORDER — SODIUM CHLORIDE 0.9 % IV SOLN
600.0000 mg/m2 | Freq: Once | INTRAVENOUS | Status: AC
  Administered 2020-03-11: 1180 mg via INTRAVENOUS
  Filled 2020-03-11: qty 59

## 2020-03-11 MED ORDER — SODIUM CHLORIDE 0.9 % IV SOLN
Freq: Once | INTRAVENOUS | Status: AC
  Filled 2020-03-11: qty 250

## 2020-03-11 MED ORDER — DEXAMETHASONE 4 MG PO TABS: ORAL_TABLET | ORAL | 1 refills | Status: DC

## 2020-03-11 MED ORDER — PALONOSETRON HCL INJECTION 0.25 MG/5ML
INTRAVENOUS | Status: AC
  Filled 2020-03-11: qty 5

## 2020-03-11 MED ORDER — FAMOTIDINE IN NACL 20-0.9 MG/50ML-% IV SOLN
20.0000 mg | Freq: Once | INTRAVENOUS | Status: AC
  Administered 2020-03-11: 20 mg via INTRAVENOUS

## 2020-03-11 NOTE — Telephone Encounter (Signed)
[facility] on [address] to call in Rx for Magic Mouthwash SOLN- 1 part viscous lidocaine 2%, 1 part Maalox, 1 part diphenhydramine 12.5 per 79mL SOLN 240 mL 46mL PO TID PRN mouth pain. Pharmacist used teachback method. Called pt to inform her Rx had been called in and pharmacy would inform her when it is ready. Pt verbalized thanks and understanding.

## 2020-03-11 NOTE — Patient Instructions (Signed)
[facility] Discharge Instructions for Patients Receiving Chemotherapy  Today you received the following chemotherapy agents Epirubicin; Cytoxin  To help prevent nausea and vomiting after your treatment, we encourage you to take your nausea medication as directed   If you develop nausea and vomiting that is not controlled by your nausea medication, call the clinic.   BELOW ARE SYMPTOMS THAT SHOULD BE REPORTED IMMEDIATELY:  *FEVER GREATER THAN 100.5 F  *CHILLS WITH OR WITHOUT FEVER  NAUSEA AND VOMITING THAT IS NOT CONTROLLED WITH YOUR NAUSEA MEDICATION  *UNUSUAL SHORTNESS OF BREATH  *UNUSUAL BRUISING OR BLEEDING  TENDERNESS IN MOUTH AND THROAT WITH OR WITHOUT PRESENCE OF ULCERS  *URINARY PROBLEMS  *BOWEL PROBLEMS  UNUSUAL RASH Items with * indicate a potential emergency and should be followed up as soon as possible.  Feel free to call the clinic should you have any questions or concerns. The clinic phone number is [phone].  Please show the [place] at check-in to the Emergency Department and triage nurse.  Epirubicin injection Qu es este medicamento? La EPIRUBICINA es un agente quimioteraputico. Se utiliza para el tratamiento del cncer de mama. Este medicamento puede ser utilizado para otros usos; si tiene alguna pregunta consulte con su proveedor de atencin mdica o con su farmacutico. MARCAS COMUNES: [place] Qu le debo informar a mi profesional de la salud antes de tomar este medicamento? Necesita saber si usted presenta alguno de los [employer] o situaciones:  trastornos sanguneos  enfermedad cardiaca, ataque cardiaco reciente  infeccin (especialmente infecciones virales, como varicela o herpes)  latidos cardiacos irregulares  enfermedad renal  enfermedad heptica  radioterapia reciente o continuada  una reaccin alrgica o inusual a la epirubicina, a otros agentes quimioteraputicos, a otros medicamentos,  alimentos, colorantes o conservantes  si est embarazada o buscando quedar embarazada  si est amamantando a un beb Cmo debo utilizar este medicamento? Este medicamento se administra mediante infusin por va intravenosa. Lo administra un profesional de la salud calificado en un hospital o en un entorno clnico. Si experimenta dolor, hinchazn, ardor o cualquier sensacin inusual alrededor del lugar de la inyeccin, informe inmediatamente a su profesional de [employer]	[place]. Hable con su pediatra para informarse acerca del uso de este medicamento en nios. Puede requerir atencin especial. Sobredosis: Pngase en contacto inmediatamente con un centro toxicolgico o una sala de urgencia si usted cree que haya tomado demasiado medicamento. ATENCIN: [employer] es solo para usted. No comparta este medicamento con nadie. Qu sucede si me olvido de una dosis? Es importante no olvidar ninguna dosis. Informe a su mdico o a su profesional de la salud si no puede asistir a Photographer. Qu puede interactuar con este medicamento? cimetidina ciclosporina docetaxel medicamentos para la presin sangunea, tales como amlodipino, felodipino y nifedipino medicamentos para aumentar los recuentos sanguneos, tales como filgrastim, pegfilgrastim, sargramostim paclitaxel vacunas Hable con su mdico o profesional de la salud antes de tomar cualquiera de estos medicamentos: acetaminofeno (paracetamol) aspirina ibuprofeno ketoprofeno naproxeno Puede ser que esta lista no menciona todas las posibles interacciones. Informe a su profesional de [employer]	[place] de [employer] productos a base de hierbas, medicamentos de [place] o suplementos nutritivos que est tomando. Si usted fuma, consume bebidas alcohlicas o si utiliza drogas ilegales, indqueselo tambin a su profesional de [employer]	[place]. Algunas sustancias pueden interactuar con su medicamento. A qu debo estar atento al usar [employer]? Se supervisar su condicin  atentamente mientras reciba este medicamento. Tendr que hacerse anlisis de  sangre peridicos mientras est tomando [employer]. Este medicamento puede hacerle sentir un Nurse, mental health. Esto es normal ya que la quimioterapia afecta tanto a las clulas sanas como a las clulas cancerosas. Si presenta alguno de los [employer], infrmelos. Sin embargo, contine con el tratamiento aun si se siente enfermo, a menos que su mdico le indique que lo suspenda. Despus de varios das de [place], [place] orina puede ser de color rojo. Esto no significa sangre en la orina. Si su orina es un color oscuro o Forensic psychologist, comunquese con su mdico. Consulte a su mdico o a su profesional de la salud por asesoramiento si tiene fiebre, escalofros, dolor de garganta o cualquier otro sntoma de resfro o gripe. No se trate usted mismo. Este medicamento puede reducir la capacidad del cuerpo para combatir infecciones. Trate de no acercarse a personas que estn enfermas. [employer] puede aumentar el riesgo de magulladuras o sangrado. Consulte a su mdico o a su profesional de la salud si observa sangrados inusuales. Proceda con cuidado al cepillar sus dientes, usar hilo dental o Risk manager palillos para los dientes, ya que puede contraer una infeccin o Therapist, art con mayor facilidad. Si se somete a algn tratamiento dental, informe a su dentista que est [employer]. Evite tomar productos que contienen aspirina, acetaminofeno, ibuprofeno, naproxeno o quetoprofeno a menos que as lo indique su mdico. Estos productos pueden disimular la fiebre. Los hombres y las mujeres en edad de procrear deben Risk manager mtodos anticonceptivos eficaces mientras reciben este medicamento. No se debe quedar embarazada mientras est tomando [employer]. Existe la posibilidad de efectos secundarios graves a un beb sin nacer. Para ms informacin hable con su profesional de la salud o su farmacutico. No debe amamantar a  un beb mientras est tomando este medicamento. Existe una cantidad mxima de este medicamento que debe recibir a lo largo de su vida. La cantidad depende de la afeccin mdica siendo tratado y su salud en general. Su mdico observar la cantidad de este medicamento que usted recibe en su vida. Informe a su mdico si usted ha tomado este medicamento antes. Qu efectos secundarios puedo tener al [employer] este medicamento? Efectos secundarios que debe informar a su mdico o a Barrister's clerk de la salud tan pronto como sea posible:  Chief of Staff como erupcin cutnea, picazn o urticarias, hinchazn de la cara, labios o lengua  conteos sanguneos bajos - este medicamento puede reducir la cantidad de glbulos blancos, glbulos rojos y plaquetas. Su riesgo de infeccin y [place].  signos de infeccin - fiebre o escalofros, tos, dolor de garganta, Social research officer, government o dificultad para orinar  signos de reduccin de plaquetas o sangrado - magulladuras, puntos rojos en la piel, heces de color oscuro o con aspecto alquitranado, sangre en la orina  signos de reduccin de glbulos rojos - cansancio o debilidad inusual, desmayos, sensacin de Lobbyist en el pecho  dolor de gota  pulso cardiaco rpido, irregular  llagas en la boca  dolor, enrojecimiento, hinchazn en el lugar de la inyeccin  hinchazn de tobillos, pies o manos Efectos secundarios que, por lo general, no requieren atencin mdica (debe informarlos a su mdico o a su profesional de la salud si persisten o si son molestos):  cambio en el color de la piel o las uas  diarrea  cada del cabello  sofocos, enrojecimiento de la cara  aumento de la sensibilidad de la piel al sol  prdida del apetito  nuseas, vmito  orina de color rojizo  [employer] Puede ser que esta lista no menciona todos los posibles efectos secundarios. Comunquese a su mdico por asesoramiento mdico  [employer] Usted puede informar los efectos secundarios a la FDA por telfono al 1-800-FDA-1088. Dnde debo guardar mi medicina? Este medicamento se administra en hospitales o clnicas y no necesitar guardarlo en su domicilio. ATENCIN: Este folleto es un resumen. Puede ser que no cubra toda la posible informacin. Si usted tiene preguntas acerca de esta medicina, consulte con su mdico, su farmacutico o su profesional de Technical sales engineer.  2020 Elsevier[employer] Standard (2017-08-07 00:00:00)

## 2020-03-11 NOTE — Progress Notes (Signed)
[name]  [phone] [phone]     ID: [name] DOB: 1975-10-17  MR#: [id number]  NKN#:[id number]  Patient Care Team: Rutherford Guys, MD as PCP - General (Family Medicine) Rolm Bookbinder, MD as Consulting Physician (General Surgery) Magrinat, Virgie Dad, MD as Consulting Physician (Oncology) Eppie Gibson, MD as Attending Physician (Radiation Oncology) Rockwell Germany, RN as Oncology Nurse Navigator Mauro Kaufmann, RN as Oncology Nurse Navigator Scot Dock, NP OTHER MD:  CHIEF COMPLAINT: Triple negative breast cancer  CURRENT TREATMENT: Neoadjuvant chemotherapy   INTERVAL HISTORY: [name] returns today for follow up and treatment of her triple negative breast cancer.  She is accompanied by our [place] interpreter.  She is undergoing neoadjuvant chemotherapy with Epirubicin and Cyclophosphamide given every 2 weeks x 4 cycles, to be followed by weekly Paclitaxel and Carboplatin x 12 cycles.  She was changed from Doxorubicin to Epirubicin due to a rash.  Today is cycle 3 day 1 of treatment.  She is feeling moderately well today.  She wants to know about her overall cancer treatment.  She is concerned because at her last infusion, she received many "bags" of therapy and she was never explained what was being given to her by the nursing staff.  She was confused and afraid to ask since she doesn't speak [place].    REVIEW OF SYSTEMS: [name] is doing well today.  She has had no fever or chills.  She has developed some oral ulcers in the back of her mouth.  She is able to eat and drink with these.  She is using listerine more frequently to help these.  She has no bowel/bladder changes, nausea or vomiting.  She is fatigued, but is remaining as active as possible.  She notes that her breast cancer is getting softer.  A detailed ROS was otherwise non contributory.    HISTORY OF CURRENT ILLNESS: From the original intake note:  Monica Vetter  Thomas herself palpated a left beast lump with associated pain. She underwent bilateral diagnostic mammography with tomography and left breast ultrasonography at [facility] on 01/23/2020 showing: breast density category B; palpable 3.6 cm left breast mass with associated calcifications at 1 o'clock; 0.8 cm left breast mass at 2 o'clock, concerning for abnormal intramammary lymph node; at least two abnormal left axillary lymph nodes.  Accordingly on 01/23/2020 she proceeded to biopsy of the left breast area in question. The pathology from this procedure ([id number]) showed: invasive ductal carcinoma at 1 o'clock, grade 3. Prognostic indicators significant for: estrogen receptor, 20% positive with moderate staining intensity and progesterone receptor, 0% negative. Proliferation marker Ki67 at 40%. HER2 negative by immunohistochemistry (1+).  The left axillary lymph node confirmed metastatic carcinoma.  The left intramammary lymph node was negative for carcinoma.  The patient's subsequent history is as detailed below.   PAST MEDICAL HISTORY: Past Medical History:  Diagnosis Date  . Allergy   . Asthma   . Chronic back pain   . Hx of migraines   . Medical history non-contributory     PAST SURGICAL HISTORY: Past Surgical History:  Procedure Laterality Date  . CESAREAN SECTION WITH BILATERAL TUBAL LIGATION Bilateral 07/23/2015   Procedure: CESAREAN SECTION WITH BILATERAL TUBAL LIGATION;  Surgeon: Donnamae Jude, MD;  Location: [facility];  Service: Obstetrics;  Laterality: Bilateral;  . PORTACATH PLACEMENT Right 02/11/2020   Procedure: INSERTION PORT-A-CATH WITH ULTRASOUND GUIDANCE;  Surgeon: Rolm Bookbinder, MD;  Location: [facility];  Service:  General;  Laterality: Right;  . TUBAL LIGATION      FAMILY HISTORY: No family history on file.  Her parents are both living as of 01/2020, her father age 67 and her mother age 58. She has three maternal half-siblings (1 sister, 2  brothers) and four paternal half-siblings (1 sister, 3 brothers). There is no family history of cancer to her knowledge.   GYNECOLOGIC HISTORY:  Patient's last menstrual period was 01/20/2020 (exact date). Menarche: 44 years old Age at first live birth: 44 years old [place] P 3 LMP 01/2020 Contraceptive s/p tubal ligation HRT n/a  Hysterectomy? no BSO? 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 Husband [name] works for a Financial trader, both in [employer]. They are both from [place]. She lives at home with [place] and their three children-- [name], [name], and [name]. She attends an [employer].    ADVANCED DIRECTIVES: In the absence of any documentation to the contrary, the patient's spouse is their HCPOA.    HEALTH MAINTENANCE: Social History   Tobacco Use  . Smoking status: Never Smoker  . Smokeless tobacco: Never Used  Vaping Use  . Vaping Use: Never used  Substance Use Topics  . Alcohol use: No  . Drug use: No     Colonoscopy: n/a (age)  PAP: 11/2017, negative (per patient)  Bone density: n/a (age)   Allergies  Allergen Reactions  . Doxorubicin Hcl Rash    Experience chest tightness, abdominal pain, and redness around lips during and shortly after doxorubicin bolus.     Current Outpatient Medications  Medication Sig Dispense Refill  . albuterol (VENTOLIN HFA) 108 (90 Base) MCG/ACT inhaler Inhale into the lungs every 6 (six) hours as needed for wheezing or shortness of breath.    . dexamethasone (DECADRON) 4 MG tablet Take 2 tablets (8 mg) by mouth twice daily with food starting the day after chemotherapy (day 2), continue day3, take the last dose the morning of day 4 30 tablet 1  . ibuprofen (ADVIL,MOTRIN) 800 MG tablet Take 1 tablet (800 mg total) by mouth 3 (three) times daily. 21 tablet 0  . lidocaine-prilocaine (EMLA) cream Apply to affected area once 30  g 3  . loratadine (CLARITIN) 10 MG tablet Take 1 tablet (10 mg total) by mouth daily. 60 tablet 0  . LORazepam (ATIVAN) 0.5 MG tablet Take 1 tablet at bedtime on days 1 ,2 and 3 of each chemotherapy treatment, then at bedtime as needed 30 tablet 0  . oxyCODONE (OXY IR/ROXICODONE) 5 MG immediate release tablet Take 1 tablet (5 mg total) by mouth every 6 (six) hours as needed. 10 tablet 0  . pantoprazole (PROTONIX) 40 MG tablet Take 1 tablet (40 mg total) by mouth daily. 30 tablet 1  . prochlorperazine (COMPAZINE) 10 MG tablet Take 1 tablet (10 mg total) by mouth 3 (three) times daily before meals for 3 days. 30 tablet 1   No current facility-administered medications for this visit.    OBJECTIVE: [name] who appears stated age  79:   03/11/20 1008  BP: (!) 118/52  Pulse: 81  Resp: 20  Temp: 98.2 F (36.8 C)  SpO2: 100%     Body mass index is 35.06 kg/m.   Wt Readings from Last 3 Encounters:  03/11/20 191 lb 11.2 oz (87 kg)  03/04/20 192 lb 11.2 oz (87.4 kg)  02/19/20 193 lb 12.8 oz (87.9 kg)  ECOG FS:1 - Symptomatic but completely ambulatory GENERAL: Patient is a well appearing female in no acute distress HEENT:  Sclerae anicteric. Ulceration noted in right posterior buccal mucosa. Neck is supple.  NODES:  No cervical, supraclavicular, or axillary lymphadenopathy palpated.  BREAST EXAM:  Difficulty palpating left breast mass, no progression noted LUNGS:  Clear to auscultation bilaterally.  No wheezes or rhonchi. HEART:  Regular rate and rhythm. No murmur appreciated. ABDOMEN:  Soft, nontender.  Positive, normoactive bowel sounds. No organomegaly palpated. MSK:  No focal spinal tenderness to palpation. Full range of motion bilaterally in the upper extremities. EXTREMITIES:  No peripheral edema.   SKIN:  Clear with no obvious rashes or skin changes. No nail dyscrasia. NEURO:  Nonfocal. Well oriented.  Appropriate affect.     LAB RESULTS:  CMP     Component  Value Date/Time   NA 138 03/04/2020 1530   NA 138 08/15/2017 1525   K 3.6 03/04/2020 1530   CL 103 03/04/2020 1530   CO2 27 03/04/2020 1530   GLUCOSE 98 03/04/2020 1530   BUN 20 03/04/2020 1530   BUN 12 08/15/2017 1525   CREATININE 0.71 03/04/2020 1530   CREATININE 0.77 02/26/2020 0915   CALCIUM 9.2 03/04/2020 1530   PROT 6.6 03/04/2020 1530   PROT 7.4 01/03/2017 1642   ALBUMIN 3.9 03/04/2020 1530   ALBUMIN 4.3 01/03/2017 1642   AST 10 (L) 03/04/2020 1530   AST 17 02/26/2020 0915   ALT 22 03/04/2020 1530   ALT 21 02/26/2020 0915   ALKPHOS 121 03/04/2020 1530   BILITOT 0.3 03/04/2020 1530   BILITOT <0.2 (L) 02/26/2020 0915   GFRNONAA >60 03/04/2020 1530   GFRNONAA >60 02/26/2020 0915   GFRAA >60 03/04/2020 1530   GFRAA >60 02/26/2020 0915    No results found for: TOTALPROTELP, ALBUMINELP, A1GS, A2GS, BETS, BETA2SER, GAMS, MSPIKE, SPEI  Lab Results  Component Value Date   WBC 6.1 03/11/2020   NEUTROABS PENDING 03/11/2020   HGB 11.3 (L) 03/11/2020   HCT 34.8 (L) 03/11/2020   MCV 86.6 03/11/2020   PLT 164 03/11/2020    No results found for: LABCA2  No components found for: PNTIRW431  No results for input(s): INR in the last 168 hours.  No results found for: LABCA2  No results found for: VQM086  No results found for: PYP950  No results found for: DTO671  No results found for: CA2729  No components found for: HGQUANT  No results found for: CEA1 / No results found for: CEA1   No results found for: AFPTUMOR  No results found for: CHROMOGRNA  No results found for: KPAFRELGTCHN, LAMBDASER, KAPLAMBRATIO (kappa/lambda light chains)  No results found for: HGBA, HGBA2QUANT, HGBFQUANT, HGBSQUAN (Hemoglobinopathy evaluation)   No results found for: LDH  No results found for: IRON, TIBC, IRONPCTSAT (Iron and TIBC)  No results found for: FERRITIN  Urinalysis No results found for: COLORURINE, APPEARANCEUR, LABSPEC, PHURINE, GLUCOSEU, HGBUR, BILIRUBINUR,  KETONESUR, PROTEINUR, UROBILINOGEN, NITRITE, LEUKOCYTESUR   STUDIES: MR BREAST BILATERAL W WO CONTRAST INC CAD  Result Date: 02/13/2020 CLINICAL DATA:  44 year old female with recently diagnosed grade 3 invasive ductal carcinoma post ultrasound-guided biopsy of a 3.6 cm mass in the left breast at the 1 o'clock position. An additional intramammary lymph node in the left breast at the 2 o'clock position was biopsied and negative for carcinoma however considered discordant with excision recommended. Abnormal lymph node in the left axilla was biopsied with pathology demonstrating metastatic carcinoma. LABS:  Not applicable.  EXAM: BILATERAL BREAST MRI WITH AND WITHOUT CONTRAST TECHNIQUE: Multiplanar, multisequence MR images of both breasts were obtained prior to and following the intravenous administration of 9 ml of Gadavist Three-dimensional MR images were rendered by post-processing of the original MR data on an independent workstation. The three-dimensional MR images were interpreted, and findings are reported in the following complete MRI report for this study. Three dimensional images were evaluated at the independent DynaCad workstation COMPARISON:  Previous exams. FINDINGS: Breast composition: b.  Scattered fibroglandular tissue. Background parenchymal enhancement: Mild. Right breast: No suspicious rapidly enhancing masses or abnormal areas of enhancement in the right breast to suggest malignancy. Left breast: Irregular enhancing mass at site of biopsy proven malignancy in the upper-outer left breast anterior to mid depth measures 6 cm AP, 3.1 cm transverse and 2.3 cm craniocaudal. The 0.8 cm previously biopsied intramammary lymph node in the upper-outer left breast demonstrates cortical thickening and remains suspicious for metastases despite benign pathology results. No additional enhancing masses or abnormal areas of enhancement identified in the left breast to suggest additional sites of disease. Lymph  nodes: Three abnormal left level I axillary nodes are visualized, the largest of which was previously biopsied measuring 3 cm and appears partially necrotic. No internal mammary lymphadenopathy. Ancillary findings:  None. IMPRESSION: 1. Biopsy proven malignancy in the upper-outer left breast measures 6 x 3.1 x 2.3 cm. No additional enhancing masses or abnormal areas of enhancement identified in the left breast to suggest additional sites of disease. 2. Previously biopsied 0.8 cm intramammary lymph node in the upper-outer left breast remains suspicious for metastatic disease despite benign pathology results, with excision recommended. 3. Three morphologically abnormal left level 1 axillary lymph nodes, the largest of which was previously biopsied and measures up to 3 cm. 4.  No MRI evidence of malignancy in the right breast. RECOMMENDATION: Treatment plan for known left breast malignancy. BI-RADS CATEGORY  6: Known biopsy-proven malignancy. Electronically Signed   By: Everlean Alstrom M.D.   On: 02/13/2020 15:08   DG CHEST PORT 1 VIEW  Result Date: 02/11/2020 CLINICAL DATA:  Port-A-Cath placement. EXAM: PORTABLE CHEST 1 VIEW COMPARISON:  Jan 12, 2018. FINDINGS: The heart size and mediastinal contours are within normal limits. Both lungs are clear. Interval placement of right internal jugular Port-A-Cath with distal tip in expected position of cavoatrial junction. No pneumothorax or pleural effusion is noted. The visualized skeletal structures are unremarkable. IMPRESSION: Interval placement of right internal jugular Port-A-Cath. No pneumothorax is noted. Electronically Signed   By: Marijo Conception M.D.   On: 02/11/2020 13:55   DG Fluoro Guide CV Line-No Report  Result Date: 02/11/2020 Fluoroscopy was utilized by the requesting physician.  No radiographic interpretation.     ELIGIBLE FOR AVAILABLE RESEARCH PROTOCOL: [phone]?  ASSESSMENT: 44 y.o. [place] woman status post left breast upper outer quadrant  biopsy 01/23/2020 for a clinical T3 N1, 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  (a) chest CT scan and bone scan 02/07/2020 showed no evidence of metastatic disease  (1) neoadjuvant chemotherapy will consist of doxorubicin and cyclophosphamide in dose dense fashion x4 starting 02/12/2020 to be followed by paclitaxel and carboplatin weekly x12  (a) echo 02/07/2020 shows an ejection fraction in the 55-60% range  (b) Epirubicin substituted for Doxorubicin secondary to allergic rash from Doxorubicin  (2) definitive surgery to follow  (3) adjuvant radiation  (4) genetics testing  (a) Negative genetic testing. No pathogenic variants identified on  the [facility] Common Hereditary Cancers Panel. VUS in MSH6 called c.2744C>G identified. The report date is 02/07/2020.  The Common Hereditary Cancers Panel offered by [facility] includes sequencing and/or deletion duplication testing of the following 48 genes: APC, ATM, AXIN2, BARD1, BMPR1A, BRCA1, BRCA2, BRIP1, CDH1, CDKN2A (p14ARF), CDKN2A (p16INK4a), CKD4, CHEK2, CTNNA1, DICER1, EPCAM (Deletion/duplication testing only), GREM1 (promoter region deletion/duplication testing only), KIT, MEN1, MLH1, MSH2, MSH3, MSH6, MUTYH, NBN, NF1, NHTL1, PALB2, PDGFRA, PMS2, POLD1, POLE, PTEN, RAD50, RAD51C, RAD51D, RNF43, SDHB, SDHC, SDHD, SMAD4, SMARCA4. STK11, TP53, TSC1, TSC2, and VHL.  The following genes were evaluated for sequence changes only: SDHA and HOXB13 c.251G>A variant only.   PLAN: Nandini continues on neoadjuvant chemotherapy with Epirubicin and Cyclophosphamide given every 2 weeks.  She is tolerating this well and is motivated to receive this again today.  I offered her an extra week off due to her mouth ulcer.  She really wants to push through and keep going.  I sent in Valtrex for her to take BID, magic mouthwash.  I recommended that she stop using the listerine as it has ETOH in it and that can further  damage her oral mucosa.  Instead, I recommended she get biotene and use it.  She will also continue with her salt water rinses.  She has some ear pain, and fluid noted.  I recommended that she get flonase and take this intranasally daily.  I explained that it helps for ear pain because if fluid gets stuck in her ear, the flonase will decrease inflammation and help that fluid to drain.  She understands this.  \  I apologized to Alenna for not receiving interpretive services to communicate her treatments.  Her interpreter assured me today that she is going to follow up on this.  I also personally called Laurence Compton, our assistant nurse manager to review the situation with her so that she can look into it.    I encouraged Kaylaann to speak up in the future if she has any questions and she can always ask for an interpreter, because we have an ipad service that can connect [place] instantly.  She understands this.    Caroll will return in 1 week for labs and f/u with Dr. Jana Hakim.  She knows to call for any questions that may arise between now and her next appointment.  We are happy to see her sooner if needed.  Total encounter time 30 minutes*  Wilber Bihari, NP 03/11/20 10:14 AM Medical Oncology and Hematology [facility] [place], [place] [address] Tel. [phone]    Fax. [phone]   *Total Encounter Time as defined by the Centers for Medicare and Medicaid Services includes, in addition to the face-to-face time of a patient visit (documented in the note above) non-face-to-face time: obtaining and reviewing outside history, ordering and reviewing medications, tests or procedures, care coordination (communications with other health care professionals or caregivers) and documentation in the medical record.

## 2020-03-11 NOTE — Patient Instructions (Signed)
Estomatitis Stomatitis La estomatitis es una afeccin que causa hinchazn (inflamacin) en la boca. Puede afectar una parte o todo el interior de la boca. Con frecuencia, afecta la mejilla, los dientes, las encas, los labios y Barrister's clerk. Tambin puede [employer] tejidos que producen mucosidad en la boca (mucosa bucal). El dolor de la estomatitis puede dificultarle comer o beber. Los [employer] graves de esta [place], pueden conllevar:  Una falta de lquido en el cuerpo (deshidratacin).  Una nutricin deficiente. Siga estas indicaciones en su casa: Medicamentos  [employer] de venta libre y los recetados solamente como se lo haya indicado el mdico.  Si le recetaron un antibitico, tmelo o aplquelo como se lo haya indicado el mdico. No deje de tomar el medicamento aunque comience a sentirse mejor.  No utilice productos que contengan benzocana en nios menores de 2 aos.  No conduzca ni use maquinaria pesada mientras toma analgsicos recetados. Comida y bebida  Trudi Ida dieta equilibrada.  No coma lo siguiente: ? Comidas picantes. ? Ctricos, como naranjas. ? Alimentos con bordes filosos, como papas fritas.  No coma ningn alimento que considere que puede estar causando esta afeccin.  No beba alcohol.  Beba suficiente lquido para Consulting civil engineer orina de color amarillo plido. Esto lo mantendr hidratado. Estilo de vida      Cuide bien su boca y sus dientes (higiene bucal): ? Lvese los dientes con un cepillo dental suave. Hgalo 2veces al da. ? Utilice hilo dental [employer]. ? Hgase limpiezas dentales con regularidad. Hgalo como se lo haya indicado el dentista.  Si tiene Information systems manager, asegrese de que le encaje como corresponde.  No consuma ningn producto que contenga nicotina o tabaco, como cigarrillos y Psychologist, sport and exercise. Si necesita ayuda para dejar de fumar, consulte al mdico.  Busque formas de reducir Dealer. Pruebe hacer yoga  o meditacin. Consulte al mdico para que le d otras ideas. Instrucciones generales  Para el dolor, use un enjuague de agua y sal como se lo haya indicado el mdico.  Concurra a todas las visitas de seguimiento como se lo haya indicado el mdico. Esto es importante. Comunquese con un mdico si:  Los sntomas empeoran.  Experimenta nuevos sntomas como los siguientes: ? Erupcin cutnea. ? Sntomas nuevos que no afectan la zona de la boca.  Los sntomas se prolongan durante ms de 3 semanas.  Los sntomas desaparecen y Teacher, adult education.  Est ms cansado.  Se siente ms dbil.  Deja de [employer].  Siente malestar estomacal (nuseas). Solicite ayuda inmediatamente si:  Tiene fiebre.  No puede comer o beber.  Tiene un sangrado importante en la boca. Resumen  La estomatitis es una afeccin que causa hinchazn en la boca.  El dolor de la estomatitis puede dificultarle comer o beber. Los [employer] graves de esta afeccin pueden conllevar una falta de lquido en el cuerpo o una nutricin deficiente.  Tome los medicamentos solamente como se lo haya indicado el mdico.  Siga las indicaciones de su mdico sobre la dieta y el estilo de vida para ayudar a Arts development officer sntomas. Esta informacin no tiene Marine scientist el consejo del mdico. Asegrese de hacerle al mdico cualquier pregunta que tenga. Document Revised: 11/08/2017 Document Reviewed: 11/08/2017 Elsevier Patient Education  [place]. Valacyclovir caplets Sander Nephew es este medicamento? El VALACICLOVIR es un medicamento antiviral. [place] para tratar o prevenir infecciones causadas por ciertos tipos de virus. Algunos ejemplos de estas infecciones incluyen herpes y [place] (  herpes zster). Este medicamento no cura los herpes. Este medicamento puede ser utilizado para otros usos; si tiene alguna pregunta consulte con su proveedor de atencin mdica o con su farmacutico. MARCAS COMUNES: Valtrex Qu le  debo informar a mi profesional de la salud antes de tomar este medicamento? Necesita saber si usted presenta alguno de los siguientes problemas o situaciones:  sndrome de inmunodeficiencia adquirida (SIDA)  cualquier otra enfermedad que puede debilitar el sistema inmunolgico  transplante de mdula sea o rin  enfermedad renal  una reaccin alrgica o inusual al valaciclovir, aciclovir, ganciclovir, valganciclovir, a otros medicamentos, alimentos, colorantes o conservantes  si est embarazada o buscando quedar embarazada  si est amamantando a un beb Cmo debo utilizar este medicamento? Tome este medicamento por va oral con un vaso de agua. Siga las instrucciones de la etiqueta del [place]. Este medicamento se puede tomar con o sin alimentos. Tome sus dosis a intervalos regulares. No tome su medicamento con una frecuencia mayor a la indicada. Complete todo el tratamiento con el medicamento segn lo haya recetado su mdico o su profesional de la salud aun si considera que su problema ha mejorado. No deje de tomarlo excepto si as lo indica su mdico o su profesional de [employer]	[place]. Hable con su pediatra para informarse acerca del uso de este medicamento en nios. Aunque este medicamento ha sido recetado a nios tan menores como de 2 aos de edad para condiciones selectivas, las precauciones se aplican. Sobredosis: Pngase en contacto inmediatamente con un centro toxicolgico o una sala de urgencia si usted cree que haya tomado demasiado medicamento. ATENCIN: [employer] es solo para usted. No comparta este medicamento con nadie. Qu sucede si me olvido de una dosis? Si olvida una dosis, tmela lo antes posible. Si es casi la hora de la prxima dosis, tome slo esa dosis. No tome dosis adicionales o dobles. Qu puede interactuar con este medicamento? No use este medicamento con ninguno de los siguientes frmacos: cidofovir Este medicamento tambin puede interactuar con los  siguientes frmacos: adefovir anfotericina B ciertos antibiticos tales como [place], gentamicina, tobramicina, vancomicina cimetidina cisplatino colistina ciclosporina foscarnet litio metotrexato probenecida tacrolims Puede ser que esta lista no menciona todas las posibles interacciones. Informe a su profesional de [employer]	[place] de [employer] productos a base de hierbas, medicamentos de [place] o suplementos nutritivos que est tomando. Si usted fuma, consume bebidas alcohlicas o si utiliza drogas ilegales, indqueselo tambin a su profesional de [employer]	[place]. Algunas sustancias pueden interactuar con su medicamento. A qu debo estar atento al usar [employer]? Si los sntomas no mejoran despus de 1 semana, informe a su mdico o a su profesional de [employer]	[place]. Este medicamento es ms eficaz cuando se lo empieza a tomar en cuanto comienza la infeccin, durante las primeras 72 horas. Comience el tratamiento tan pronto como le sea posible despus de observar los primeros signos de infeccin como, hormigueo, picazn o dolor en la zona afectada. Es posible transmitir el herpes genital aun cuando no tiene sntomas. Siempre sigue prcticas de sexo seguro como usar preservativos de ltex o poliuretano cuando tiene contacto sexual. Debe mantenerse bien hidrato mientras est tomando este medicamento. Beba lquido en abundancia. Qu efectos secundarios puedo tener al [employer] este medicamento? Efectos secundarios que debe informar a su mdico o a Barrister's clerk de la salud tan pronto como sea posible:  Chief of Staff como erupcin cutnea, picazn o urticarias, hinchazn de la cara, labios o lengua  comportamiento agresivo  confusin  alucinaciones  problemas de coordinacin, del habla, al caminar  dolor estomacal  temblor  dificultad para orinar o cambios en el volumen de orina Efectos secundarios que, por lo general, no requieren atencin mdica (debe informarlos a su mdico o a Ship broker de la salud si persisten o si son molestos):  Presenter, broadcasting de cabeza  nuseas, vmito Puede ser que esta lista no menciona todos los posibles efectos secundarios. Comunquese a su mdico por asesoramiento mdico [employer]. Usted puede informar los efectos secundarios a la FDA por telfono al 1-800-FDA-1088. Dnde debo guardar mi medicina? Mantngala fuera del alcance de los nios. Gurdela a [employer], entre 15 y 40 grados C (23 y 39 grados F). Mantenga el envase bien cerrado. Deseche todo el medicamento que no haya utilizado, despus de la fecha de vencimiento. ATENCIN: Este folleto es un resumen. Puede ser que no cubra toda la posible informacin. Si usted tiene preguntas acerca de esta medicina, consulte con su mdico, su farmacutico o su profesional de Technical sales engineer.  2020 Elsevier/Gold Standard (2018-12-17 00:00:00)

## 2020-03-12 ENCOUNTER — Telehealth: Payer: Self-pay | Admitting: Adult Health

## 2020-03-12 NOTE — Telephone Encounter (Signed)
Scheduled appts per 6/30 los. Pt to get updated appt calendar at next visit.

## 2020-03-13 ENCOUNTER — Inpatient Hospital Stay: Payer: No Typology Code available for payment source | Attending: Oncology

## 2020-03-13 ENCOUNTER — Other Ambulatory Visit: Payer: Self-pay

## 2020-03-13 VITALS — BP 119/81 | HR 84 | Temp 98.7°F | Resp 18

## 2020-03-13 DIAGNOSIS — C773 Secondary and unspecified malignant neoplasm of axilla and upper limb lymph nodes: Secondary | ICD-10-CM | POA: Insufficient documentation

## 2020-03-13 DIAGNOSIS — Z5189 Encounter for other specified aftercare: Secondary | ICD-10-CM | POA: Insufficient documentation

## 2020-03-13 DIAGNOSIS — K5900 Constipation, unspecified: Secondary | ICD-10-CM | POA: Insufficient documentation

## 2020-03-13 DIAGNOSIS — G8929 Other chronic pain: Secondary | ICD-10-CM | POA: Insufficient documentation

## 2020-03-13 DIAGNOSIS — J45909 Unspecified asthma, uncomplicated: Secondary | ICD-10-CM | POA: Insufficient documentation

## 2020-03-13 DIAGNOSIS — J320 Chronic maxillary sinusitis: Secondary | ICD-10-CM | POA: Insufficient documentation

## 2020-03-13 DIAGNOSIS — Z79899 Other long term (current) drug therapy: Secondary | ICD-10-CM | POA: Insufficient documentation

## 2020-03-13 DIAGNOSIS — R51 Headache with orthostatic component, not elsewhere classified: Secondary | ICD-10-CM | POA: Insufficient documentation

## 2020-03-13 DIAGNOSIS — Z5111 Encounter for antineoplastic chemotherapy: Secondary | ICD-10-CM | POA: Insufficient documentation

## 2020-03-13 DIAGNOSIS — C50412 Malignant neoplasm of upper-outer quadrant of left female breast: Secondary | ICD-10-CM | POA: Insufficient documentation

## 2020-03-13 DIAGNOSIS — Z171 Estrogen receptor negative status [ER-]: Secondary | ICD-10-CM | POA: Insufficient documentation

## 2020-03-13 DIAGNOSIS — R1030 Lower abdominal pain, unspecified: Secondary | ICD-10-CM | POA: Insufficient documentation

## 2020-03-13 MED ORDER — PEGFILGRASTIM-CBQV 6 MG/0.6ML ~~LOC~~ SOSY
PREFILLED_SYRINGE | SUBCUTANEOUS | Status: AC
  Filled 2020-03-13: qty 0.6

## 2020-03-13 MED ORDER — PEGFILGRASTIM-CBQV 6 MG/0.6ML ~~LOC~~ SOSY
6.0000 mg | PREFILLED_SYRINGE | Freq: Once | SUBCUTANEOUS | Status: AC
  Administered 2020-03-13: 6 mg via SUBCUTANEOUS

## 2020-03-17 NOTE — Progress Notes (Signed)
[facility]   [phone] [phone]   Clinic Follow up Note   Patient Care Team: Rutherford Guys, MD as PCP - General (Family Medicine) Rolm Bookbinder, MD as Consulting Physician (General Surgery) Magrinat, Virgie Dad, MD as Consulting Physician (Oncology) Eppie Gibson, MD as Attending Physician (Radiation Oncology) Rockwell Germany, RN as Oncology Nurse Navigator Mauro Kaufmann, RN as Oncology Nurse Navigator 03/18/2020  CHIEF COMPLAINT: F/u triple negative breast cancer, toxicity check  CURRENT THERAPY: Neoadjuvant chemo, epirubicin (changed from doxorubicin due to rash) and cyclophosphamide q2 weeks x4, followed by weekly carbo/taxol x12 weeks   INTERVAL HISTORY: [name] returns for f/u and treatment as scheduled. She completed cycle 3 AC on 03/11/20.  She feels good today, treatment is going well.  The breast mass is softer.  She feels better than the first cycle which was very rough.  Appetite is fair, denies mucositis.  She has moderate fatigue but functional.  On day 2-3 after treatment she begins to have lower abdominal pain and mild nausea after eating.  Compazine helps the nausea but not the pain.  This is been stable since cycle 2.  This is unlike the "fire" pain and reflux she had with cycle 1 which resolved with Protonix.  She has 2-3 soft stools per day, does not have to strain.  Has occasional blood with wiping after BM but no obvious blood mixed in the stool.  Denies dysuria.  She also complains of a dry cough that began 3 weeks ago, no chest pain or dyspnea, no fever or chills.  Cough is worse at night with lying down.   MEDICAL HISTORY:  Past Medical History:  Diagnosis Date  . Allergy   . Asthma   . Chronic back pain   . Hx of migraines   . Medical history non-contributory     SURGICAL HISTORY: Past Surgical History:  Procedure Laterality Date  . CESAREAN SECTION WITH BILATERAL TUBAL LIGATION Bilateral 07/23/2015    Procedure: CESAREAN SECTION WITH BILATERAL TUBAL LIGATION;  Surgeon: Donnamae Jude, MD;  Location: [facility];  Service: Obstetrics;  Laterality: Bilateral;  . PORTACATH PLACEMENT Right 02/11/2020   Procedure: INSERTION PORT-A-CATH WITH ULTRASOUND GUIDANCE;  Surgeon: Rolm Bookbinder, MD;  Location: [facility];  Service: General;  Laterality: Right;  . TUBAL LIGATION      I have reviewed the social history and family history with the patient and they are unchanged from previous note.  ALLERGIES:  is allergic to doxorubicin hcl.  MEDICATIONS:  Current Outpatient Medications  Medication Sig Dispense Refill  . albuterol (VENTOLIN HFA) 108 (90 Base) MCG/ACT inhaler Inhale into the lungs every 6 (six) hours as needed for wheezing or shortness of breath.    . dexamethasone (DECADRON) 4 MG tablet Take 2 tablets (8 mg) by mouth twice daily with food starting the day after chemotherapy (day 2), continue day3, take the last dose the morning of day 4 30 tablet 1  . ibuprofen (ADVIL,MOTRIN) 800 MG tablet Take 1 tablet (800 mg total) by mouth 3 (three) times daily. 21 tablet 0  . lidocaine-prilocaine (EMLA) cream Apply to affected area once 30 g 3  . loratadine (CLARITIN) 10 MG tablet Take 1 tablet (10 mg total) by mouth daily. 60 tablet 0  . LORazepam (ATIVAN) 0.5 MG tablet Take 1 tablet at bedtime on days 1 ,2 and 3 of each chemotherapy treatment, then at bedtime as needed 30 tablet 0  . magic mouthwash SOLN  Take 5 mLs by mouth 3 (three) times daily as needed for mouth pain. 240 mL 0  . oxyCODONE (OXY IR/ROXICODONE) 5 MG immediate release tablet Take 1 tablet (5 mg total) by mouth every 6 (six) hours as needed. 10 tablet 0  . pantoprazole (PROTONIX) 40 MG tablet Take 1 tablet (40 mg total) by mouth daily. 30 tablet 1  . prochlorperazine (COMPAZINE) 10 MG tablet Take 1 tablet (10 mg total) by mouth 3 (three) times daily before Thomas for 3 days. 30 tablet 1  . valACYclovir (VALTREX) 500 MG  tablet Take 1 tablet (500 mg total) by mouth 2 (two) times daily. 60 tablet 1   No current facility-administered medications for this visit.    PHYSICAL EXAMINATION: ECOG PERFORMANCE STATUS: 1 - Symptomatic but completely ambulatory  Vitals:   03/18/20 1308  BP: 119/70  Pulse: 93  Resp: 18  Temp: 98.4 F (36.9 C)  SpO2: 100%   Filed Weights   03/18/20 1308  Weight: 190 lb 4.8 oz (86.3 kg)    GENERAL:alert, no distress and comfortable SKIN: No rash to exposed skin EYES:  sclera clear LUNGS: clear with normal breathing effort HEART: regular rate & rhythm, no lower extremity edema ABDOMEN:abdomen soft, decreased bowel sounds.  Mild tenderness in the umbilicus NEURO: alert & oriented x 3 with fluent speech, normal gait PAC without erythema  LABORATORY DATA:  I have reviewed the data as listed CBC Latest Ref Rng & Units 03/18/2020 03/11/2020 03/04/2020  WBC 4.0 - 10.5 K/uL 2.8(L) 6.1 3.9(L)  Hemoglobin 12.0 - 15.0 g/dL 10.8(L) 11.3(L) 11.0(L)  Hematocrit 36 - 46 % 33.0(L) 34.8(L) 33.6(L)  Platelets 150 - 400 K/uL 178 164 195     CMP Latest Ref Rng & Units 03/18/2020 03/11/2020 03/04/2020  Glucose 70 - 99 mg/dL 96 106(H) 98  BUN 6 - 20 mg/dL 11 13 20   Creatinine 0.44 - 1.00 mg/dL 0.74 0.74 0.71  Sodium 135 - 145 mmol/L 141 140 138  Potassium 3.5 - 5.1 mmol/L 4.0 4.0 3.6  Chloride 98 - 111 mmol/L 106 106 103  CO2 22 - 32 mmol/L 27 25 27   Calcium 8.9 - 10.3 mg/dL 9.5 9.4 9.2  Total Protein 6.5 - 8.1 g/dL 6.8 6.8 6.6  Total Bilirubin 0.3 - 1.2 mg/dL 0.3 <0.2(L) 0.3  Alkaline Phos 38 - 126 U/L 125 106 121  AST 15 - 41 U/L 16 16 10(L)  ALT 0 - 44 U/L 26 22 22       RADIOGRAPHIC STUDIES: I have personally reviewed the radiological images as listed and agreed with the findings in the report. No results found.   ASSESSMENT & PLAN: 44 y.o. woman   1. Malignant neoplasm of upper outer quadrant of left breast  -s/p left breast upper outer quadrant biopsy 01/23/2020 - invasive  ductal carcinoma, G3, functionally triple negative with MIB-1 of 40%, cT3 N1, stage IIIC -staging work up 02/07/2020 - no evidence of metastatic disease -baseline echo 02/07/20 showed EF 55-60% range  -genetic testing 02/07/20 - no pathogenic mutations (invitae common hereditary panel) -began neoadjuvant chemotherapy dose dense doxorubicin and cyclophosphamide q2 weeks x4 starting 02/12/2020 to be followed by paclitaxel and carboplatin weekly x12 -treatment plan includes definitive surgery to follow chemo, then adjuvant radiation   2.  Lower abdominal pain, nausea, dry cough -She developed severe reflux with cycle 1 AC, resolved with Protonix -With cycle 2 (first cycle epirubicin), she developed persistent dull lower abdominal pain, nausea, and dry cough -Nausea is adequately managed  with Compazine as needed.  She does not appear constipated.   -Abdomen is slightly tender on exam, respiratory exam is benign  -This constellation of symptoms may represent an atypical presentation of reflux, I recommend to increase Protonix to 1 tab twice daily. -Given that she is mildly neutropenic on chemo, I recommend to obtain chest x-ray and abdominal x-ray for further exploration.  She plans to return on 7/8 for imaging.  I will call her with the result.  If negative, this is likely related to chemo -She otherwise appears stable, tolerated cycle 3 epirubicin/cyclophosphamide moderately well overall.  CBC and CMP reviewed.  ANC 1.6, s/p G-CSF -Return for lab, follow-up, and cycle 4 chemo on 7/14 as planned -She knows to call with any new or worsening symptoms and understands if she develops fever this would would be an oncologic emergency and she would need to report to ED for expedited thorough work-up  3. Allergic rash secondary to doxorubicin, changed to epirubicin and rash resolved       Plan: -Labs reviewed -CXR and ABD x-ray on 7/8 -Increase Protonix to 1 tab twice daily -Will call patient after x-ray  results -Return for lab, follow-up, and cycle 4 as planned on 7/14 -Will call clinic if she develops new or worsening symptoms in the interim   No problem-specific Assessment & Plan notes found for this encounter.   Orders Placed This Encounter  Procedures  . DG Abd 2 Views    Standing Status:   Future    Standing Expiration Date:   03/18/2021    Order Specific Question:   Reason for Exam (SYMPTOM  OR DIAGNOSIS REQUIRED)    Answer:   lower abdominal pain, breast cancer on chemo    Order Specific Question:   Is patient pregnant?    Answer:   No    Order Specific Question:   Preferred imaging location?    Answer:   [facility]    Order Specific Question:   Radiology Contrast Protocol - do NOT remove file path    Answer:   \\[email or web address]s.pdf  . DG Chest 2 View    Standing Status:   Future    Standing Expiration Date:   03/18/2021    Order Specific Question:   Reason for Exam (SYMPTOM  OR DIAGNOSIS REQUIRED)    Answer:   3 week dry cough, breast cancer on chemo    Order Specific Question:   Is patient pregnant?    Answer:   No    Order Specific Question:   Preferred imaging location?    Answer:   [facility]    Order Specific Question:   Radiology Contrast Protocol - do NOT remove file path    Answer:   \\[email or web address]\DXFluoroContrastProtocols.pdf   All questions were answered. The patient knows to call the clinic with any problems, questions or concerns. No barriers to learning were detected with the presence of a Spanish-speaking interpreter. Total encounter time was 30 minutes      Alla Feeling, NP 03/18/20

## 2020-03-18 ENCOUNTER — Inpatient Hospital Stay: Payer: No Typology Code available for payment source

## 2020-03-18 ENCOUNTER — Other Ambulatory Visit: Payer: Self-pay

## 2020-03-18 ENCOUNTER — Inpatient Hospital Stay (HOSPITAL_BASED_OUTPATIENT_CLINIC_OR_DEPARTMENT_OTHER): Payer: Self-pay | Admitting: Nurse Practitioner

## 2020-03-18 ENCOUNTER — Encounter: Payer: Self-pay | Admitting: Nurse Practitioner

## 2020-03-18 VITALS — BP 119/70 | HR 93 | Temp 98.4°F | Resp 18 | Ht 62.0 in | Wt 190.3 lb

## 2020-03-18 DIAGNOSIS — R05 Cough: Secondary | ICD-10-CM

## 2020-03-18 DIAGNOSIS — R1030 Lower abdominal pain, unspecified: Secondary | ICD-10-CM

## 2020-03-18 DIAGNOSIS — R058 Other specified cough: Secondary | ICD-10-CM

## 2020-03-18 DIAGNOSIS — C50412 Malignant neoplasm of upper-outer quadrant of left female breast: Secondary | ICD-10-CM

## 2020-03-18 LAB — CBC WITH DIFFERENTIAL/PLATELET
Abs Immature Granulocytes: 0.14 10*3/uL — ABNORMAL HIGH (ref 0.00–0.07)
Basophils Absolute: 0 10*3/uL (ref 0.0–0.1)
Basophils Relative: 1 %
Eosinophils Absolute: 0 10*3/uL (ref 0.0–0.5)
Eosinophils Relative: 0 %
HCT: 33 % — ABNORMAL LOW (ref 36.0–46.0)
Hemoglobin: 10.8 g/dL — ABNORMAL LOW (ref 12.0–15.0)
Immature Granulocytes: 5 %
Lymphocytes Relative: 28 %
Lymphs Abs: 0.8 10*3/uL (ref 0.7–4.0)
MCH: 28.5 pg (ref 26.0–34.0)
MCHC: 32.7 g/dL (ref 30.0–36.0)
MCV: 87.1 fL (ref 80.0–100.0)
Monocytes Absolute: 0.2 10*3/uL (ref 0.1–1.0)
Monocytes Relative: 7 %
Neutro Abs: 1.6 10*3/uL — ABNORMAL LOW (ref 1.7–7.7)
Neutrophils Relative %: 59 %
Platelets: 178 10*3/uL (ref 150–400)
RBC: 3.79 MIL/uL — ABNORMAL LOW (ref 3.87–5.11)
RDW: 14.3 % (ref 11.5–15.5)
WBC: 2.8 10*3/uL — ABNORMAL LOW (ref 4.0–10.5)
nRBC: 0 % (ref 0.0–0.2)

## 2020-03-18 LAB — COMPREHENSIVE METABOLIC PANEL
ALT: 26 U/L (ref 0–44)
AST: 16 U/L (ref 15–41)
Albumin: 3.8 g/dL (ref 3.5–5.0)
Alkaline Phosphatase: 125 U/L (ref 38–126)
Anion gap: 8 (ref 5–15)
BUN: 11 mg/dL (ref 6–20)
CO2: 27 mmol/L (ref 22–32)
Calcium: 9.5 mg/dL (ref 8.9–10.3)
Chloride: 106 mmol/L (ref 98–111)
Creatinine, Ser: 0.74 mg/dL (ref 0.44–1.00)
GFR calc Af Amer: >60 mL/min (ref >60)
GFR calc non Af Amer: >60 mL/min (ref >60)
Glucose, Bld: 96 mg/dL (ref 70–99)
Potassium: 4 mmol/L (ref 3.5–5.1)
Sodium: 141 mmol/L (ref 135–145)
Total Bilirubin: 0.3 mg/dL (ref 0.3–1.2)
Total Protein: 6.8 g/dL (ref 6.5–8.1)

## 2020-03-19 ENCOUNTER — Ambulatory Visit (HOSPITAL_COMMUNITY)
Admission: RE | Admit: 2020-03-19 | Discharge: 2020-03-19 | Disposition: A | Discharge status: Home / Self Care | Payer: No Typology Code available for payment source | Source: Ambulatory Visit | Attending: Nurse Practitioner | Admitting: Nurse Practitioner

## 2020-03-19 DIAGNOSIS — R05 Cough: Secondary | ICD-10-CM | POA: Insufficient documentation

## 2020-03-19 DIAGNOSIS — R1030 Lower abdominal pain, unspecified: Secondary | ICD-10-CM

## 2020-03-19 DIAGNOSIS — R058 Other specified cough: Secondary | ICD-10-CM

## 2020-03-20 ENCOUNTER — Telehealth: Payer: Self-pay | Admitting: Nurse Practitioner

## 2020-03-20 NOTE — Telephone Encounter (Signed)
No 7/7 los

## 2020-03-24 ENCOUNTER — Telehealth: Payer: Self-pay

## 2020-03-24 NOTE — Progress Notes (Addendum)
[name]  [phone] [phone]     ID: [name] DOB: Nov 27, 1975  MR#: [id number]  SVX#:[id number]  Patient Care Team: Rutherford Guys, MD as PCP - General (Family Medicine) Rolm Bookbinder, MD as Consulting Physician (General Surgery) Magrinat, Virgie Dad, MD as Consulting Physician (Oncology) Eppie Gibson, MD as Attending Physician (Radiation Oncology) Rockwell Germany, RN as Oncology Nurse Navigator Mauro Kaufmann, RN as Oncology Nurse Navigator Scot Dock, NP OTHER MD:  CHIEF COMPLAINT: Triple negative breast cancer  CURRENT TREATMENT: Neoadjuvant chemotherapy   INTERVAL HISTORY: [name] returns today for follow up and treatment of her triple negative breast cancer.    She is undergoing neoadjuvant chemotherapy with Epirubicin and Cyclophosphamide given every 2 weeks x 4 cycles, to be followed by weekly Paclitaxel and Carboplatin x 12 cycles.  She was changed from Doxorubicin to Epirubicin due to a rash.  Today is cycle 4 day 1 of her Epirubicin and Cyclophosphamide therapy.  She receives Udenyca (Neulasta biosimilar) on day 3.  She notes that her breast tumor feels softer.    We were accompanied by spanish interpreter Cianny via [place] and then another interpreter, however by the time I got her name the call disconnected again.  Luckily Dr. Jana Hakim speaks spanish and made time to come into our appointment.    REVIEW OF SYSTEMS: [name] is feeling moderately well today.  She notes some left nasal discharge, and right maxillary pressure.  She first noticed this last Thursday.  She says that the pressure has improved.  She continues to have the nasal drainage.  She has not had fever or chills.  She has occasional headaches.  She has had some myalgias, these are improving.  She has no change in her smell, and mild taste change, that is most noticeable for 5 days after chemotherapy. She notes a continued improvement in her left  breast tumor and that it is softening.     She denies any nausea, vomiting, bowel/bladder changes, headaches, vision issues, chest pain, shortness of breath, palpitations, mucositis, or cough. A detailed ROS was otherwise non contributory.    HISTORY OF CURRENT ILLNESS: From the original intake note:  [name] herself palpated a left beast lump with associated pain. She underwent bilateral diagnostic mammography with tomography and left breast ultrasonography at [facility] on 01/23/2020 showing: breast density category B; palpable 3.6 cm left breast mass with associated calcifications at 1 o'clock; 0.8 cm left breast mass at 2 o'clock, concerning for abnormal intramammary lymph node; at least two abnormal left axillary lymph nodes.  Accordingly on 01/23/2020 she proceeded to biopsy of the left breast area in question. The pathology from this procedure ([id number]) showed: invasive ductal carcinoma at 1 o'clock, grade 3. Prognostic indicators significant for: estrogen receptor, 20% positive with moderate staining intensity and progesterone receptor, 0% negative. Proliferation marker Ki67 at 40%. HER2 negative by immunohistochemistry (1+).  The left axillary lymph node confirmed metastatic carcinoma.  The left intramammary lymph node was negative for carcinoma.  The patient's subsequent history is as detailed below.   PAST MEDICAL HISTORY: Past Medical History:  Diagnosis Date  . Allergy   . Asthma   . Chronic back pain   . Hx of migraines   . Medical history non-contributory     PAST SURGICAL HISTORY: Past Surgical History:  Procedure Laterality Date  . CESAREAN SECTION WITH BILATERAL TUBAL LIGATION Bilateral 07/23/2015   Procedure: CESAREAN SECTION WITH BILATERAL TUBAL LIGATION;  Surgeon: Donnamae Jude, MD;  Location: [facility];  Service: Obstetrics;  Laterality: Bilateral;  . PORTACATH PLACEMENT Right 02/11/2020   Procedure: INSERTION PORT-A-CATH WITH ULTRASOUND GUIDANCE;   Surgeon: Rolm Bookbinder, MD;  Location: [facility];  Service: General;  Laterality: Right;  . TUBAL LIGATION      FAMILY HISTORY: History reviewed. No pertinent family history.  Her parents are both living as of 01/2020, her father age 64 and her mother age 26. She has three maternal half-siblings (1 sister, 2 brothers) and four paternal half-siblings (1 sister, 3 brothers). There is no family history of cancer to her knowledge.   GYNECOLOGIC HISTORY:  Patient's last menstrual period was 01/20/2020 (exact date). Menarche: 44 years old Age at first live birth: 44 years old [place] P 3 LMP 01/2020 Contraceptive s/p tubal ligation HRT n/a  Hysterectomy? no BSO? 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 Husband Vaughan Sine works for a Financial trader, both in [employer]. They are both from [place]. She lives at home with Binford and their three children-- [name], [place], and [name]. She attends an [employer].    ADVANCED DIRECTIVES: In the absence of any documentation to the contrary, the patient's spouse is their HCPOA.    HEALTH MAINTENANCE: Social History   Tobacco Use  . Smoking status: Never Smoker  . Smokeless tobacco: Never Used  Vaping Use  . Vaping Use: Never used  Substance Use Topics  . Alcohol use: No  . Drug use: No     Colonoscopy: n/a (age)  PAP: 11/2017, negative (per patient)  Bone density: n/a (age)   Allergies  Allergen Reactions  . Doxorubicin Hcl Rash    Experience chest tightness, abdominal pain, and redness around lips during and shortly after doxorubicin bolus.     Current Outpatient Medications  Medication Sig Dispense Refill  . albuterol (VENTOLIN HFA) 108 (90 Base) MCG/ACT inhaler Inhale into the lungs every 6 (six) hours as needed for wheezing or shortness of breath.    . dexamethasone (DECADRON) 4 MG tablet Take 2 tablets  (8 mg) by mouth twice daily with food starting the day after chemotherapy (day 2), continue day3, take the last dose the morning of day 4 30 tablet 1  . ibuprofen (ADVIL,MOTRIN) 800 MG tablet Take 1 tablet (800 mg total) by mouth 3 (three) times daily. 21 tablet 0  . lidocaine-prilocaine (EMLA) cream Apply to affected area once 30 g 3  . loratadine (CLARITIN) 10 MG tablet Take 1 tablet (10 mg total) by mouth daily. 60 tablet 0  . LORazepam (ATIVAN) 0.5 MG tablet Take 1 tablet at bedtime on days 1 ,2 and 3 of each chemotherapy treatment, then at bedtime as needed 30 tablet 0  . magic mouthwash SOLN Take 5 mLs by mouth 3 (three) times daily as needed for mouth pain. 240 mL 0  . oxyCODONE (OXY IR/ROXICODONE) 5 MG immediate release tablet Take 1 tablet (5 mg total) by mouth every 6 (six) hours as needed. 10 tablet 0  . pantoprazole (PROTONIX) 40 MG tablet Take 1 tablet (40 mg total) by mouth daily. 30 tablet 1  . valACYclovir (VALTREX) 500 MG tablet Take 1 tablet (500 mg total) by mouth 2 (two) times daily. 60 tablet 1  . prochlorperazine (COMPAZINE) 10 MG tablet Take 1 tablet (10 mg total) by mouth 3 (three) times daily before meals for 3 days. 30 tablet  1   No current facility-administered medications for this visit.    OBJECTIVE:   Vitals:   03/25/20 0927  BP: (!) 125/56  Pulse: 80  Resp: 18  Temp: 98.3 F (36.8 C)  SpO2: 100%     Body mass index is 34.99 kg/m.   Wt Readings from Last 3 Encounters:  03/25/20 191 lb 4.8 oz (86.8 kg)  03/18/20 190 lb 4.8 oz (86.3 kg)  03/11/20 191 lb 11.2 oz (87 kg)      ECOG FS:1 - Symptomatic but completely ambulatory GENERAL: Patient is a well appearing female in no acute distress HEENT:  Sclerae anicteric. Ulceration noted in right posterior buccal mucosa--much improved. Neck is supple. + right maxillary sinus tenderness, right TM with fluid noted, no infection, Left TM normal NODES:  No cervical, supraclavicular, or axillary lymphadenopathy  palpated.  BREAST EXAM:  Difficulty palpating left breast mass, no progression noted LUNGS:  Clear to auscultation bilaterally.  No wheezes or rhonchi. HEART:  Regular rate and rhythm. No murmur appreciated. ABDOMEN:  Soft, nontender.  Positive, normoactive bowel sounds. No organomegaly palpated. MSK:  No focal spinal tenderness to palpation. Full range of motion bilaterally in the upper extremities. EXTREMITIES:  No peripheral edema.   SKIN:  Clear with no obvious rashes or skin changes. No nail dyscrasia. NEURO:  Nonfocal. Well oriented.  Appropriate affect.     LAB RESULTS:  CMP     Component Value Date/Time   NA 141 03/18/2020 1254   NA 138 08/15/2017 1525   K 4.0 03/18/2020 1254   CL 106 03/18/2020 1254   CO2 27 03/18/2020 1254   GLUCOSE 96 03/18/2020 1254   BUN 11 03/18/2020 1254   BUN 12 08/15/2017 1525   CREATININE 0.74 03/18/2020 1254   CREATININE 0.77 02/26/2020 0915   CALCIUM 9.5 03/18/2020 1254   PROT 6.8 03/18/2020 1254   PROT 7.4 01/03/2017 1642   ALBUMIN 3.8 03/18/2020 1254   ALBUMIN 4.3 01/03/2017 1642   AST 16 03/18/2020 1254   AST 17 02/26/2020 0915   ALT 26 03/18/2020 1254   ALT 21 02/26/2020 0915   ALKPHOS 125 03/18/2020 1254   BILITOT 0.3 03/18/2020 1254   BILITOT <0.2 (L) 02/26/2020 0915   GFRNONAA >60 03/18/2020 1254   GFRNONAA >60 02/26/2020 0915   GFRAA >60 03/18/2020 1254   GFRAA >60 02/26/2020 0915    No results found for: TOTALPROTELP, ALBUMINELP, A1GS, A2GS, BETS, BETA2SER, GAMS, MSPIKE, SPEI  Lab Results  Component Value Date   WBC 7.2 03/25/2020   NEUTROABS PENDING 03/25/2020   HGB 10.6 (L) 03/25/2020   HCT 32.6 (L) 03/25/2020   MCV 87.4 03/25/2020   PLT 216 03/25/2020    No results found for: LABCA2  No components found for: DXIPJA250  No results for input(s): INR in the last 168 hours.  No results found for: LABCA2  No results found for: NLZ767  No results found for: HAL937  No results found for: TKW409  No  results found for: CA2729  No components found for: HGQUANT  No results found for: CEA1 / No results found for: CEA1   No results found for: AFPTUMOR  No results found for: CHROMOGRNA  No results found for: KPAFRELGTCHN, LAMBDASER, KAPLAMBRATIO (kappa/lambda light chains)  No results found for: HGBA, HGBA2QUANT, HGBFQUANT, HGBSQUAN (Hemoglobinopathy evaluation)   No results found for: LDH  No results found for: IRON, TIBC, IRONPCTSAT (Iron and TIBC)  No results found for: FERRITIN  Urinalysis No results found for:  COLORURINE, APPEARANCEUR, LABSPEC, PHURINE, GLUCOSEU, HGBUR, BILIRUBINUR, KETONESUR, PROTEINUR, UROBILINOGEN, NITRITE, LEUKOCYTESUR   STUDIES: DG Chest 2 View  Result Date: 03/20/2020 CLINICAL DATA:  Cough.  History of breast carcinoma EXAM: CHEST - 2 VIEW COMPARISON:  February 11, 2020 FINDINGS: Port-A-Cath tip is in the superior vena cava. No pneumothorax. The lungs are clear. The heart size and pulmonary vascularity are normal. No adenopathy. No bone lesions. IMPRESSION: Port-A-Cath tip in superior vena cava. No pneumothorax. Lungs clear. No evident adenopathy. Electronically Signed   By: Lowella Grip III M.D.   On: 03/20/2020 15:16   DG Abd 2 Views  Result Date: 03/20/2020 CLINICAL DATA:  Lower abdominal pain.  History of breast carcinoma EXAM: ABDOMEN - 2 VIEW COMPARISON:  None. FINDINGS: Supine and upright images were obtained. There is moderate stool in the colon. There is no bowel dilatation or air-fluid level to suggest bowel obstruction. No free air. Tubal ligation clips are present in the pelvis bilaterally. Lung bases are clear. IMPRESSION: Moderate stool in colon.  No bowel obstruction or free air. Electronically Signed   By: Lowella Grip III M.D.   On: 03/20/2020 15:15     ELIGIBLE FOR AVAILABLE RESEARCH PROTOCOL: P7106?  ASSESSMENT: 44 y.o.  woman status post left breast upper outer quadrant biopsy 01/23/2020 for a clinical T3 N1, stage  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  (a) chest CT scan and bone scan 02/07/2020 showed no evidence of metastatic disease  (1) neoadjuvant chemotherapy will consist of doxorubicin and cyclophosphamide in dose dense fashion x4 starting 02/12/2020 to be followed by paclitaxel and carboplatin weekly x12  (a) echo 02/07/2020 shows an ejection fraction in the 55-60% range  (b) Epirubicin substituted for Doxorubicin secondary to allergic rash from Doxorubicin  (2) definitive surgery to follow  (3) adjuvant radiation  (4) genetics testing  (a) Negative genetic testing. No pathogenic variants identified on the [facility] Common Hereditary Cancers Panel. VUS in MSH6 called c.2744C>G identified. The report date is 02/07/2020.  The Common Hereditary Cancers Panel offered by [facility] includes sequencing and/or deletion duplication testing of the following 48 genes: APC, ATM, AXIN2, BARD1, BMPR1A, BRCA1, BRCA2, BRIP1, CDH1, CDKN2A (p14ARF), CDKN2A (p16INK4a), CKD4, CHEK2, CTNNA1, DICER1, EPCAM (Deletion/duplication testing only), GREM1 (promoter region deletion/duplication testing only), KIT, MEN1, MLH1, MSH2, MSH3, MSH6, MUTYH, NBN, NF1, NHTL1, PALB2, PDGFRA, PMS2, POLD1, POLE, PTEN, RAD50, RAD51C, RAD51D, RNF43, SDHB, SDHC, SDHD, SMAD4, SMARCA4. STK11, TP53, TSC1, TSC2, and VHL.  The following genes were evaluated for sequence changes only: SDHA and HOXB13 c.251G>A variant only.   PLAN: [name] continues on treatment with neoadjuvant chemotherapy with Epirubicin and Cyclophosphamide.  Her labs are stable and she will proceed with her therapy today.  Her tumor is responding to treatment and shrinking, on exam.    She met with myself and Dr. Jana Hakim to review her plan.  She has a right maxillary sinusitis.  I sent in Astelin and Flonase, along with saline nasal spray for her to use.  I also sent in a Zpak.    [name] will return in 1 week for labs and f/u.   She knows to call for any questions that may arise between now and her next appointment.  We are happy to see her sooner if needed.  Total encounter time 20 minutes*  Wilber Bihari, NP 03/25/20 9:58 AM Medical Oncology and Hematology [facility] [place], [place] [address] Tel. [phone]    Fax.  [phone]   ADDENDUM: I ended up seeing [name] because there was no adequate translation/interpreter.  Once we were able to communicate it became clear that she does have a bothersome sinusitis involving only the right nostril not the left.  We are going to treat this symptomatically and we are prescribing Zithromax for her.  We are however proceeding with her final cycle of the current chemotherapy and she already has an appointment to see [place] next week.  I have encouraged her to call us with any other issue that may develop before the next visit.  It would be important in the future to have an interpreter present at all visits.  I personally saw this patient and performed a substantive portion of this encounter with the listed APP documented above.   Chauncey Cruel, MD Medical Oncology and Hematology [facility] [address] [place],  [address] Tel. [phone]    Fax. [phone]    *Total Encounter Time as defined by the Centers for Medicare and Medicaid Services includes, in addition to the face-to-face time of a patient visit (documented in the note above) non-face-to-face time: obtaining and reviewing outside history, ordering and reviewing medications, tests or procedures, care coordination (communications with other health care professionals or caregivers) and documentation in the medical record.

## 2020-03-24 NOTE — Telephone Encounter (Signed)
-----   Message from Alla Feeling, NP sent at 03/23/2020  9:19 PM EDT ----- Please let her know chest and abdominal xrays are negative. No pulmonary fluid or infection. Cough may be related to reflux. See if she increased protonix and if that is helping. There is moderate stool in the colon suggesting constipation, I recommend miralax once daily PRN. Otherwise no acute change or concerning findings.   Thanks, Regan Rakers

## 2020-03-24 NOTE — Telephone Encounter (Signed)
Several attempts made to reach patient for update no successful outcomes

## 2020-03-25 ENCOUNTER — Other Ambulatory Visit: Payer: Self-pay

## 2020-03-25 ENCOUNTER — Inpatient Hospital Stay: Payer: Self-pay

## 2020-03-25 ENCOUNTER — Inpatient Hospital Stay (HOSPITAL_BASED_OUTPATIENT_CLINIC_OR_DEPARTMENT_OTHER): Payer: Self-pay | Admitting: Adult Health

## 2020-03-25 ENCOUNTER — Other Ambulatory Visit: Payer: Self-pay | Admitting: Oncology

## 2020-03-25 ENCOUNTER — Encounter: Payer: Self-pay | Admitting: *Deleted

## 2020-03-25 ENCOUNTER — Encounter: Payer: Self-pay | Admitting: Adult Health

## 2020-03-25 VITALS — BP 125/56 | HR 80 | Temp 98.3°F | Resp 18 | Ht 62.0 in | Wt 191.3 lb

## 2020-03-25 DIAGNOSIS — J019 Acute sinusitis, unspecified: Secondary | ICD-10-CM

## 2020-03-25 DIAGNOSIS — C50412 Malignant neoplasm of upper-outer quadrant of left female breast: Secondary | ICD-10-CM

## 2020-03-25 DIAGNOSIS — Z95828 Presence of other vascular implants and grafts: Secondary | ICD-10-CM

## 2020-03-25 DIAGNOSIS — Z170 Estrogen receptor positive status [ER+]: Secondary | ICD-10-CM

## 2020-03-25 DIAGNOSIS — B9689 Other specified bacterial agents as the cause of diseases classified elsewhere: Secondary | ICD-10-CM

## 2020-03-25 LAB — CBC WITH DIFFERENTIAL/PLATELET
Abs Immature Granulocytes: 0.89 10*3/uL — ABNORMAL HIGH (ref 0.00–0.07)
Basophils Absolute: 0 10*3/uL (ref 0.0–0.1)
Basophils Relative: 1 %
Eosinophils Absolute: 0 10*3/uL (ref 0.0–0.5)
Eosinophils Relative: 0 %
HCT: 32.6 % — ABNORMAL LOW (ref 36.0–46.0)
Hemoglobin: 10.6 g/dL — ABNORMAL LOW (ref 12.0–15.0)
Immature Granulocytes: 12 %
Lymphocytes Relative: 20 %
Lymphs Abs: 1.4 10*3/uL (ref 0.7–4.0)
MCH: 28.4 pg (ref 26.0–34.0)
MCHC: 32.5 g/dL (ref 30.0–36.0)
MCV: 87.4 fL (ref 80.0–100.0)
Monocytes Absolute: 0.6 10*3/uL (ref 0.1–1.0)
Monocytes Relative: 8 %
Neutro Abs: 4.3 10*3/uL (ref 1.7–7.7)
Neutrophils Relative %: 59 %
Platelets: 216 10*3/uL (ref 150–400)
RBC: 3.73 MIL/uL — ABNORMAL LOW (ref 3.87–5.11)
RDW: 14.7 % (ref 11.5–15.5)
WBC: 7.2 10*3/uL (ref 4.0–10.5)
nRBC: 0.7 % — ABNORMAL HIGH (ref 0.0–0.2)

## 2020-03-25 LAB — COMPREHENSIVE METABOLIC PANEL
ALT: 35 U/L (ref 0–44)
AST: 20 U/L (ref 15–41)
Albumin: 3.4 g/dL — ABNORMAL LOW (ref 3.5–5.0)
Alkaline Phosphatase: 108 U/L (ref 38–126)
Anion gap: 11 (ref 5–15)
BUN: 13 mg/dL (ref 6–20)
CO2: 25 mmol/L (ref 22–32)
Calcium: 9.5 mg/dL (ref 8.9–10.3)
Chloride: 107 mmol/L (ref 98–111)
Creatinine, Ser: 0.72 mg/dL (ref 0.44–1.00)
GFR calc Af Amer: >60 mL/min (ref >60)
GFR calc non Af Amer: >60 mL/min (ref >60)
Glucose, Bld: 110 mg/dL — ABNORMAL HIGH (ref 70–99)
Potassium: 3.8 mmol/L (ref 3.5–5.1)
Sodium: 143 mmol/L (ref 135–145)
Total Bilirubin: <0.2 mg/dL — ABNORMAL LOW (ref 0.3–1.2)
Total Protein: 6.5 g/dL (ref 6.5–8.1)

## 2020-03-25 MED ORDER — DIPHENHYDRAMINE HCL 50 MG/ML IJ SOLN
25.0000 mg | Freq: Once | INTRAMUSCULAR | Status: AC
  Administered 2020-03-25: 25 mg via INTRAVENOUS

## 2020-03-25 MED ORDER — SALINE SPRAY 0.65 % NA SOLN: 2.0000 | NASAL | 0 refills | Status: DC | PRN

## 2020-03-25 MED ORDER — SODIUM CHLORIDE 0.9% FLUSH
10.0000 mL | INTRAVENOUS | Status: DC | PRN
  Administered 2020-03-25: 10 mL
  Filled 2020-03-25: qty 10

## 2020-03-25 MED ORDER — PALONOSETRON HCL INJECTION 0.25 MG/5ML
INTRAVENOUS | Status: AC
  Filled 2020-03-25: qty 5

## 2020-03-25 MED ORDER — AZITHROMYCIN 250 MG PO TABS: ORAL_TABLET | ORAL | 0 refills | Status: DC

## 2020-03-25 MED ORDER — DIPHENHYDRAMINE HCL 50 MG/ML IJ SOLN
INTRAMUSCULAR | Status: AC
  Filled 2020-03-25: qty 1

## 2020-03-25 MED ORDER — FLUTICASONE PROPIONATE 50 MCG/ACT NA SUSP: 2.0000 | Freq: Every day | NASAL | 2 refills | Status: DC

## 2020-03-25 MED ORDER — SODIUM CHLORIDE 0.9 % IV SOLN
150.0000 mg | Freq: Once | INTRAVENOUS | Status: AC
  Administered 2020-03-25: 150 mg via INTRAVENOUS
  Filled 2020-03-25: qty 150

## 2020-03-25 MED ORDER — PALONOSETRON HCL INJECTION 0.25 MG/5ML
0.2500 mg | Freq: Once | INTRAVENOUS | Status: AC
  Administered 2020-03-25: 0.25 mg via INTRAVENOUS

## 2020-03-25 MED ORDER — FAMOTIDINE IN NACL 20-0.9 MG/50ML-% IV SOLN
20.0000 mg | Freq: Once | INTRAVENOUS | Status: AC
  Administered 2020-03-25: 20 mg via INTRAVENOUS

## 2020-03-25 MED ORDER — HEPARIN SOD (PORK) LOCK FLUSH 100 UNIT/ML IV SOLN
500.0000 [IU] | Freq: Once | INTRAVENOUS | Status: AC | PRN
  Administered 2020-03-25: 500 [IU]
  Filled 2020-03-25: qty 5

## 2020-03-25 MED ORDER — FAMOTIDINE IN NACL 20-0.9 MG/50ML-% IV SOLN
INTRAVENOUS | Status: AC
  Filled 2020-03-25: qty 50

## 2020-03-25 MED ORDER — SODIUM CHLORIDE 0.9 % IV SOLN
Freq: Once | INTRAVENOUS | Status: AC
  Filled 2020-03-25: qty 250

## 2020-03-25 MED ORDER — SODIUM CHLORIDE 0.9% FLUSH
10.0000 mL | Freq: Once | INTRAVENOUS | Status: AC
  Administered 2020-03-25: 10 mL
  Filled 2020-03-25: qty 10

## 2020-03-25 MED ORDER — AZELASTINE HCL 0.1 % NA SOLN: 2.0000 | Freq: Two times a day (BID) | NASAL | 12 refills | Status: DC

## 2020-03-25 MED ORDER — SODIUM CHLORIDE 0.9 % IV SOLN
600.0000 mg/m2 | Freq: Once | INTRAVENOUS | Status: AC
  Administered 2020-03-25: 1180 mg via INTRAVENOUS
  Filled 2020-03-25: qty 59

## 2020-03-25 MED ORDER — SODIUM CHLORIDE 0.9 % IV SOLN
10.0000 mg | Freq: Once | INTRAVENOUS | Status: AC
  Administered 2020-03-25: 10 mg via INTRAVENOUS
  Filled 2020-03-25: qty 10

## 2020-03-25 MED ORDER — EPIRUBICIN HCL CHEMO IV INJECTION 200 MG/100ML
75.0000 mg/m2 | Freq: Once | INTRAVENOUS | Status: AC
  Administered 2020-03-25: 148 mg via INTRAVENOUS
  Filled 2020-03-25: qty 74

## 2020-03-25 NOTE — Patient Instructions (Signed)
Gua de cuidados en el hogar del dispositivo de perfusin implantable Implanted [place] Guide Un dispositivo de perfusin implantable es un dispositivo que se coloca debajo de la piel. Por lo general, se coloca en el pecho. Puede usarse para suministrar medicamentos intravenosos, tomar muestras de sangre o realizar dilisis. Puede tener un dispositivo implantable si:  Necesita administrarse un medicamento intravenoso que podra provocar una irritacin en las venas pequeas de las manos o los brazos.  Necesita medicamentos por va intravenosa a largo plazo, como antibiticos.  Necesita recibir nutricin por va intravenosa durante un largo perodo.  Debe someterse a dilisis. Si tiene un dispositivo, su mdico no tendr que recurrir a las venas de sus brazos para estos procedimientos. Puede tener algunas limitaciones ms al usar el dispositivo que si usara otro tipo de vas intravenosas a largo plazo, y es probable que pueda retomar sus actividades normales cuando cure la incisin. Un dispositivo de perfusin implantable tiene dos partes principales:  Depsito. El depsito es la parte en donde se inserta la aguja para administrar los medicamentos o extraer sangre. El depsito es redondo. Luego de colocado, se ve como un rea pequea y elevada debajo de su piel.  Catter. El catter es un tubo delgado y flexible que conecta el depsito a una vena. Los medicamentos que se introducen en el depsito, pasan a travs del catter y luego llegan a la vena. Cmo se accede al dispositivo de perfusin? Para acceder al dispositivo:  Puede que se le coloque crema anestsica sobre la piel encima del dispositivo.  El mdico se colocar una mscara y guantes estriles.  La piel sobre el dispositivo se limpiar con cuidado con un jabn antisptico y se dejar secar.  Su mdico pellizcar suavemente el dispositivo e insertar una aguja dentro.  Su mdico revisar el retorno de sangre para garantizar que  el dispositivo est en la vena y no est obstruido.  Si el dispositivo debe tener accesibilidad para un suministro continuo de medicamentos (infusin constante), su mdico colocar un vendaje (venda) claro sobre el lugar donde se coloc la aguja. El vendaje y la aguja debern cambiarse todas las semanas o segn las indicaciones del mdico. Qu es el purgado? El purgado ayuda a que el dispositivo no se obstruya. Siga las indicaciones del mdico acerca de cmo y cundo purgar el dispositivo. Los dispositivos de perfusin generalmente se purgan con una solucin salina o un medicamento llamado heparina. La necesidad de purgar depender de cmo se use el dispositivo:  Si se usa solo eventualmente para suministrar medicamentos o extraer sangre, se debe purgar el dispositivo: ? Antes y despus de la administracin de los medicamentos. ? Antes y despus de una extraccin de sangre. ? Como parte de una rutina de mantenimiento. Es recomendable que se purgue cada 4 o 6 semanas.  Si la infusin es constante, no necesitar limpiarlo.  Deseche las agujas y las jeringas en un contenedor para desechos destinado a objetos punzantes(recipiente para objetos punzantes). Puede comprar un recipiente para objetos punzantes en una farmacia, o puede hacer uno usted mismo con una botella de plstico rgida y un cobertor. Durante cunto tiempo tendr implantado el dispositivo? El dispositivo puede permanecer implantado por el tiempo que el mdico considere necesario. Cuando llega el momento de retirar el dispositivo, deber someterse a una ciruga. Esta ciruga ser similar a la realizada en el momento de la colocacin del dispositivo. Siga estas indicaciones en su casa:   Purgue el dispositivo como se lo haya indicado el   mdico.  Si debe recibir una infusin durante varios das, siga las indicaciones del mdico acerca de cmo cuidar el lugar donde tiene colocado el dispositivo. Haga lo siguiente: ? Lvese las manos  con agua y jabn antes de cambiar el apsito. Use desinfectante para manos con alcohol si no dispone de agua y jabn. ? Cambie el vendaje como se lo haya indicado el mdico. ? Coloque vendas usadas o bolsas de infusin en una bolsa plstica. Tire esa bolsa en el contenedor de basura. ? Mantenga el vendaje que cubre la aguja limpio y seco. No deje que se moje. ? No use tijeras u objetos punzantes cerca del tubo. ? Mantenga el tubo sujetado, excepto cuando se use.  Controle el lugar donde tiene colocado el dispositivo todos los das para detectar signos de infeccin. Est atento a los siguientes signos: ? Dolor, hinchazn o enrojecimiento. ? Lquido o sangre. ? Pus o mal olor.  Proteja la piel alrededor del lugar donde tiene colocado el dispositivo. ? Evite usar sostenes si los breteles rozan o irritan el lugar. ? Proteja la piel alrededor del dispositivo cuando se coloque el cinturn de seguridad. Coloque una almohadilla suave en el pecho, de ser necesario.  Dchese o bese como se lo haya indicado el mdico. Se puede mojar el lugar siempre y cuando no est recibiendo activamente una infusin.  Retome sus actividades normales como se lo haya indicado el mdico. Pregntele al mdico qu actividades son seguras para usted.  Utilice un brazalete o lleve consigo una tarjeta de alerta mdica en todo momento. Esto les permitir a los mdicos saber que tiene un dispositivo implantable en caso de una emergencia. Solicite ayuda de inmediato si:  Tiene enrojecimiento, hinchazn o dolor en el lugar donde tiene el dispositivo.  Le sale una mayor cantidad de lquido o de sangre del lugar donde tiene el dispositivo.  Tiene pus o percibe mal olor que proviene del lugar donde tiene colocado el dispositivo.  Tiene fiebre. Resumen  Por lo general, los dispositivos implantados se colocan en el pecho para un acceso intravenoso a largo plazo.  Siga las indicaciones del mdico sobre cmo purgar el  dispositivo y cambiar las vendas (vendajes).  Cuide el rea alrededor de donde tiene colocado el dispositivo, evite la ropa que presione el rea, y preste atencin a los signos de infeccin.  Proteja la piel alrededor del dispositivo cuando se coloque el cinturn de seguridad. Coloque una almohadilla suave en el pecho, de ser necesario.  Busque ayuda de inmediato si tiene fiebre o enrojecimiento, hinchazn, dolor, drenaje o mal olor en el lugar donde tiene el dispositivo. Esta informacin no tiene como fin reemplazar el consejo del mdico. Asegrese de hacerle al mdico cualquier pregunta que tenga. Document Revised: 06/01/2017 Document Reviewed: 06/01/2017 Elsevier Patient Education  2020 Elsevier Inc.  

## 2020-03-25 NOTE — Patient Instructions (Signed)
[facility] Discharge Instructions for Patients Receiving Chemotherapy  Today you received the following chemotherapy agents: epirubicin and cyclophosphamide.  To help prevent nausea and vomiting after your treatment, we encourage you to take your nausea medication as directed.   If you develop nausea and vomiting that is not controlled by your nausea medication, call the clinic.   BELOW ARE SYMPTOMS THAT SHOULD BE REPORTED IMMEDIATELY:  *FEVER GREATER THAN 100.5 F  *CHILLS WITH OR WITHOUT FEVER  NAUSEA AND VOMITING THAT IS NOT CONTROLLED WITH YOUR NAUSEA MEDICATION  *UNUSUAL SHORTNESS OF BREATH  *UNUSUAL BRUISING OR BLEEDING  TENDERNESS IN MOUTH AND THROAT WITH OR WITHOUT PRESENCE OF ULCERS  *URINARY PROBLEMS  *BOWEL PROBLEMS  UNUSUAL RASH Items with * indicate a potential emergency and should be followed up as soon as possible.  Feel free to call the clinic should you have any questions or concerns. The clinic phone number is [phone].  Please show the [facility] at check-in to the Emergency Department and triage nurse.

## 2020-03-26 ENCOUNTER — Telehealth: Payer: Self-pay | Admitting: Adult Health

## 2020-03-26 NOTE — Telephone Encounter (Signed)
No 7/14 los. No changes made to pt's schedule.  °

## 2020-03-27 ENCOUNTER — Other Ambulatory Visit: Payer: Self-pay

## 2020-03-27 ENCOUNTER — Inpatient Hospital Stay: Payer: No Typology Code available for payment source

## 2020-03-27 VITALS — BP 109/75 | HR 98 | Resp 17

## 2020-03-27 DIAGNOSIS — C50412 Malignant neoplasm of upper-outer quadrant of left female breast: Secondary | ICD-10-CM

## 2020-03-27 MED ORDER — PEGFILGRASTIM-CBQV 6 MG/0.6ML ~~LOC~~ SOSY
6.0000 mg | PREFILLED_SYRINGE | Freq: Once | SUBCUTANEOUS | Status: AC
  Administered 2020-03-27: 6 mg via SUBCUTANEOUS

## 2020-03-27 MED ORDER — PEGFILGRASTIM-CBQV 6 MG/0.6ML ~~LOC~~ SOSY
PREFILLED_SYRINGE | SUBCUTANEOUS | Status: AC
  Filled 2020-03-27: qty 0.6

## 2020-03-31 NOTE — Progress Notes (Signed)
[place]   [phone] Fax:(336) [phone]   Clinic Follow up Note   Patient Care Team: Rutherford Guys, MD as PCP - General (Family Medicine) Rolm Bookbinder, MD as Consulting Physician (General Surgery) Magrinat, Virgie Dad, MD as Consulting Physician (Oncology) Eppie Gibson, MD as Attending Physician (Radiation Oncology) Rockwell Germany, RN as Oncology Nurse Navigator Mauro Kaufmann, RN as Oncology Nurse Navigator 04/01/2020  CHIEF COMPLAINT: Follow-up triple negative breast cancer  CURRENT THERAPY: Neoadjuvant chemotherapy with epirubicin and cyclophosphamide every 2 weeks x4 starting 02/12/2020, to be followed by paclitaxel day 1, 8, 15 q. 21 days and carboplatin on day 1 of every cycle for a total of x12 weeks  INTERVAL HISTORY: [name] returns for follow-up as scheduled a remote video Spanish interpreter was utilized. She completed cycle 4 cyclophosphamide and epirubicin on 7/14 followed by Udenyca on 7/16.  She reports the cycle went "much better", without nausea or feeling down.  She did have headache and bone pain x 3 days following udenyca.  Her sinus symptoms resolved spontaneously, she never took Z-Pak.  Denies any fever, chills, cough, chest pain, dyspnea.  She denies nausea, vomiting, constipation, diarrhea.  She has 1-2 BM per day.  She does still report lower abdominal pain that is constant but fluctuates.  She did not have this pain before chemo.  After the first chemo she felt a "burning from the mouth down."  Then she felt more of a dull aching pain in the lower abdomen especially with eating.  Now pain is not associated with Thomas or movement.  Pain never goes away.  She tried increasing Protonix to twice daily which was not much help.  Appetite is adequate.  Energy level is normal.  The left breast mass feels softer to her.   MEDICAL HISTORY:  Past Medical History:  Diagnosis Date  . Allergy   . Asthma   . Chronic back pain   . Hx  of migraines   . Medical history non-contributory     SURGICAL HISTORY: Past Surgical History:  Procedure Laterality Date  . CESAREAN SECTION WITH BILATERAL TUBAL LIGATION Bilateral 07/23/2015   Procedure: CESAREAN SECTION WITH BILATERAL TUBAL LIGATION;  Surgeon: Donnamae Jude, MD;  Location: [facility];  Service: Obstetrics;  Laterality: Bilateral;  . PORTACATH PLACEMENT Right 02/11/2020   Procedure: INSERTION PORT-A-CATH WITH ULTRASOUND GUIDANCE;  Surgeon: Rolm Bookbinder, MD;  Location: [facility];  Service: General;  Laterality: Right;  . TUBAL LIGATION      I have reviewed the social history and family history with the patient and they are unchanged from previous note.  ALLERGIES:  is allergic to doxorubicin hcl.  MEDICATIONS:  Current Outpatient Medications  Medication Sig Dispense Refill  . albuterol (VENTOLIN HFA) 108 (90 Base) MCG/ACT inhaler Inhale into the lungs every 6 (six) hours as needed for wheezing or shortness of breath.    Marland Kitchen azelastine (ASTELIN) 0.1 % nasal spray Place 2 sprays into both nostrils 2 (two) times daily. Use in each nostril as directed 30 mL 12  . azithromycin (ZITHROMAX) 250 MG tablet 2 tabs on day 1, then 1 tab daily until complete 6 each 0  . dexamethasone (DECADRON) 4 MG tablet Take 2 tablets (8 mg) by mouth twice daily with food starting the day after chemotherapy (day 2), continue day3, take the last dose the morning of day 4 30 tablet 1  . fluticasone (FLONASE) 50 MCG/ACT nasal spray Place 2 sprays into both  nostrils daily. 16 g 2  . ibuprofen (ADVIL,MOTRIN) 800 MG tablet Take 1 tablet (800 mg total) by mouth 3 (three) times daily. 21 tablet 0  . lidocaine-prilocaine (EMLA) cream Apply to affected area once 30 g 3  . loratadine (CLARITIN) 10 MG tablet Take 1 tablet (10 mg total) by mouth daily. 60 tablet 0  . LORazepam (ATIVAN) 0.5 MG tablet Take 1 tablet at bedtime on days 1 ,2 and 3 of each chemotherapy treatment, then at bedtime as  needed 30 tablet 0  . magic mouthwash SOLN Take 5 mLs by mouth 3 (three) times daily as needed for mouth pain. 240 mL 0  . oxyCODONE (OXY IR/ROXICODONE) 5 MG immediate release tablet Take 1 tablet (5 mg total) by mouth every 6 (six) hours as needed. 10 tablet 0  . pantoprazole (PROTONIX) 40 MG tablet Take 1 tablet (40 mg total) by mouth daily. 30 tablet 1  . sodium chloride (OCEAN) 0.65 % SOLN nasal spray Place 2 sprays into both nostrils as needed for congestion. 30 mL 0  . valACYclovir (VALTREX) 500 MG tablet Take 1 tablet (500 mg total) by mouth 2 (two) times daily. 60 tablet 1  . prochlorperazine (COMPAZINE) 10 MG tablet Take 1 tablet (10 mg total) by mouth 3 (three) times daily before Thomas for 3 days. 30 tablet 1   No current facility-administered medications for this visit.    PHYSICAL EXAMINATION: ECOG PERFORMANCE STATUS: 1 - Symptomatic but completely ambulatory  Vitals:   04/01/20 1306  BP: (!) 114/56  Pulse: 79  Resp: 18  Temp: 97.6 F (36.4 C)  SpO2: 100%   Filed Weights   04/01/20 1306  Weight: 188 lb 9.6 oz (85.5 kg)    GENERAL:alert, no distress and comfortable SKIN: No rash to exposed skin EYES:  sclera clear LUNGS:  normal breathing effort HEART:  no lower extremity edema ABDOMEN:abdomen soft, non-tender, hypoactive bowel sounds.  No hepatosplenomegaly NEURO: alert & oriented x 3 with fluent speech, normal gait PAC without erythema Breast: Soft tissue density in the left breast 1 o'clock position without discrete mass  LABORATORY DATA:  I have reviewed the data as listed CBC Latest Ref Rng & Units 04/01/2020 03/25/2020 03/18/2020  WBC 4.0 - 10.5 K/uL 4.1 7.2 2.8(L)  Hemoglobin 12.0 - 15.0 g/dL 10.0(L) 10.6(L) 10.8(L)  Hematocrit 36 - 46 % 30.6(L) 32.6(L) 33.0(L)  Platelets 150 - 400 K/uL 239 216 178     CMP Latest Ref Rng & Units 04/01/2020 03/25/2020 03/18/2020  Glucose 70 - 99 mg/dL 100(H) 110(H) 96  BUN 6 - 20 mg/dL 15 13 11   Creatinine 0.44 - 1.00 mg/dL  0.75 0.72 0.74  Sodium 135 - 145 mmol/L 140 143 141  Potassium 3.5 - 5.1 mmol/L 4.0 3.8 4.0  Chloride 98 - 111 mmol/L 107 107 106  CO2 22 - 32 mmol/L 25 25 27   Calcium 8.9 - 10.3 mg/dL 10.1 9.5 9.5  Total Protein 6.5 - 8.1 g/dL 6.7 6.5 6.8  Total Bilirubin 0.3 - 1.2 mg/dL <0.2(L) <0.2(L) 0.3  Alkaline Phos 38 - 126 U/L 126 108 125  AST 15 - 41 U/L 12(L) 20 16  ALT 0 - 44 U/L 16 35 26      RADIOGRAPHIC STUDIES: I have personally reviewed the radiological images as listed and agreed with the findings in the report. No results found.   ASSESSMENT & PLAN: 44 y.o.woman   1. Malignant neoplasm of upper outer quadrant of left breast  -s/p left breast  upper outer quadrant biopsy 01/23/2020 - invasive ductal carcinoma, G3, functionally triple negative with MIB-1 of 40%, cT3 N1, stage IIIC -staging work up 02/07/2020 - no evidence of metastatic disease -baseline echo 02/07/20 showed EF 55-60% range  -genetic testing 02/07/20 - no pathogenic mutations ([facility] common hereditary panel) -began neoadjuvant chemotherapy dose dense doxorubicin and cyclophosphamide q2 weeks x4 starting 02/12/2020- 03/25/20, to be followed by weekly paclitaxel and carboplatin q3 weeks for total of 12 weeks per the care plan.  -[name] appears stable.  She completed cycle 4 epirubicin and cyclophosphamide.  She tolerated well overall except bone pain and headache from Udenyca.  She continues to have low abdominal pain that began with chemo.  She is able to recover and function well. -Breast exam shows soft tissue density at the 1 o'clock position, no discrete mass, indicating a clinical response to treatment -CBC and CMP stable -She will return for follow-up and cycle 1 day 1 Taxol/carbo next week.  We reviewed potential side effects and symptom management. -treatment plan includes definitive surgery to follow chemo, then adjuvant radiation   2.  Lower abdominal pain -She developed severe reflux with cycle 1 AC,  resolved with Protonix -With cycle 2 (first cycle epirubicin), she developed persistent dull lower abdominal pain, nausea, and dry cough.  She did not have this pain prior to chemo. -CXR negative on 7/7, cough resolved in the interval  -abd film on 03/18/20 was negative except moderate stool in the colon.  This is consistent with her lower abdominal pain and hypoactive bowel sounds on exam today. -Pain did not improve with Protonix twice daily.  This could be related to chemo and/or constipation versus other.   -I recommend to use stool softener and/or laxative once daily, she agrees to try. -Overall, her pain is mild, no significant weight loss, able to function normally, and does not require pain medicine.  Will monitor closely  3.  Sinusitis -Symptoms resolved spontaneously, she did not require antibiotic  4. Allergic rash secondary to doxorubicin, changed to epirubicin and rash resolved  Plan: -Labs reviewed -Breast exam consistent with clinical response to treatment -Given stool softener and/or laxative for stool burden in the colon, monitor abdominal pain -Lab, follow-up next week to begin Taxol/carbo  All questions were answered. The patient knows to call the clinic with any problems, questions or concerns. No barriers to learning were detected with the use of Spanish interpreter. Total encounter time is 30 minutes.    Alla Feeling, NP 04/01/20

## 2020-04-01 ENCOUNTER — Inpatient Hospital Stay: Payer: No Typology Code available for payment source

## 2020-04-01 ENCOUNTER — Inpatient Hospital Stay (HOSPITAL_BASED_OUTPATIENT_CLINIC_OR_DEPARTMENT_OTHER): Payer: Self-pay | Admitting: Nurse Practitioner

## 2020-04-01 ENCOUNTER — Other Ambulatory Visit: Payer: Self-pay

## 2020-04-01 ENCOUNTER — Encounter: Payer: Self-pay | Admitting: Nurse Practitioner

## 2020-04-01 VITALS — BP 114/56 | HR 79 | Temp 97.6°F | Resp 18 | Ht 62.0 in | Wt 188.6 lb

## 2020-04-01 DIAGNOSIS — Z170 Estrogen receptor positive status [ER+]: Secondary | ICD-10-CM

## 2020-04-01 DIAGNOSIS — C50412 Malignant neoplasm of upper-outer quadrant of left female breast: Secondary | ICD-10-CM

## 2020-04-01 LAB — CBC WITH DIFFERENTIAL/PLATELET
Abs Immature Granulocytes: 0.25 10*3/uL — ABNORMAL HIGH (ref 0.00–0.07)
Basophils Absolute: 0.1 10*3/uL (ref 0.0–0.1)
Basophils Relative: 1 %
Eosinophils Absolute: 0 10*3/uL (ref 0.0–0.5)
Eosinophils Relative: 0 %
HCT: 30.6 % — ABNORMAL LOW (ref 36.0–46.0)
Hemoglobin: 10 g/dL — ABNORMAL LOW (ref 12.0–15.0)
Immature Granulocytes: 6 %
Lymphocytes Relative: 24 %
Lymphs Abs: 1 10*3/uL (ref 0.7–4.0)
MCH: 28.2 pg (ref 26.0–34.0)
MCHC: 32.7 g/dL (ref 30.0–36.0)
MCV: 86.4 fL (ref 80.0–100.0)
Monocytes Absolute: 0.3 10*3/uL (ref 0.1–1.0)
Monocytes Relative: 8 %
Neutro Abs: 2.4 10*3/uL (ref 1.7–7.7)
Neutrophils Relative %: 61 %
Platelets: 239 10*3/uL (ref 150–400)
RBC: 3.54 MIL/uL — ABNORMAL LOW (ref 3.87–5.11)
RDW: 14.5 % (ref 11.5–15.5)
WBC: 4.1 10*3/uL (ref 4.0–10.5)
nRBC: 0 % (ref 0.0–0.2)

## 2020-04-01 LAB — COMPREHENSIVE METABOLIC PANEL
ALT: 16 U/L (ref 0–44)
AST: 12 U/L — ABNORMAL LOW (ref 15–41)
Albumin: 3.6 g/dL (ref 3.5–5.0)
Alkaline Phosphatase: 126 U/L (ref 38–126)
Anion gap: 8 (ref 5–15)
BUN: 15 mg/dL (ref 6–20)
CO2: 25 mmol/L (ref 22–32)
Calcium: 10.1 mg/dL (ref 8.9–10.3)
Chloride: 107 mmol/L (ref 98–111)
Creatinine, Ser: 0.75 mg/dL (ref 0.44–1.00)
GFR calc Af Amer: >60 mL/min (ref >60)
GFR calc non Af Amer: >60 mL/min (ref >60)
Glucose, Bld: 100 mg/dL — ABNORMAL HIGH (ref 70–99)
Potassium: 4 mmol/L (ref 3.5–5.1)
Sodium: 140 mmol/L (ref 135–145)
Total Bilirubin: <0.2 mg/dL — ABNORMAL LOW (ref 0.3–1.2)
Total Protein: 6.7 g/dL (ref 6.5–8.1)

## 2020-04-01 NOTE — Progress Notes (Signed)
Patient given update with xray and last visit with this visit in office pt had no questions and understands

## 2020-04-02 ENCOUNTER — Other Ambulatory Visit: Payer: Self-pay | Admitting: Oncology

## 2020-04-03 ENCOUNTER — Telehealth: Payer: Self-pay | Admitting: Nurse Practitioner

## 2020-04-03 NOTE — Telephone Encounter (Signed)
No 7/21 los

## 2020-04-07 ENCOUNTER — Encounter: Payer: Self-pay | Admitting: Oncology

## 2020-04-07 ENCOUNTER — Telehealth: Payer: Self-pay | Admitting: *Deleted

## 2020-04-07 NOTE — Progress Notes (Signed)
Spoke w/ [name] w/[employer] whom advised patient not eligible due to citizenship status.  Patient called to inquire about how to proceed with Financial Assistance application previously submitted being that she received call stating not eligible for Medicaid. With interpreting assistance from [facility] in registration, advised patient I would check on and have someone call her.  Went to office of [employer](Brittany) where patient's accounts were referred. She states she would call patient to screen for Medicaid and call me back. She called patient and found patient not eligible and referred accounts back to customer service for financial assistance.  Called Customer Service(Janice) to advise of update and asked if anything else was needed from patient for financial assistance review other than what was sent in May 2021. She checked w/ financial assistance contact and advised me that nothing was needed once [employer] refers account back to them.   Customer Service should send patient correspondence regarding updated status of application.

## 2020-04-07 NOTE — Progress Notes (Signed)
[name]  Telephone:(336) [phone] Fax:(336) [phone]     ID: [name] DOB: 23-Nov-1975  MR#: [id number]  JIR#:[id number]  Patient Care Team: Rutherford Guys, MD as PCP - General (Family Medicine) Rolm Bookbinder, MD as Consulting Physician (General Surgery) Magrinat, Virgie Dad, MD as Consulting Physician (Oncology) Eppie Gibson, MD as Attending Physician (Radiation Oncology) Rockwell Germany, RN as Oncology Nurse Navigator Mauro Kaufmann, RN as Oncology Nurse Navigator Scot Dock, NP OTHER MD:  CHIEF COMPLAINT: Triple negative breast cancer  CURRENT TREATMENT: Neoadjuvant chemotherapy   INTERVAL HISTORY:  [name] returns today for follow up and treatment of her triple negative breast cancer.  She is accompanied by Sunday Spillers, a [place] interpreter.  She is undergoing neoadjuvant chemotherapy with Epirubicin and Cyclophosphamide given every 2 weeks x 4 cycles, to be followed by weekly Paclitaxel and Carboplatin x 12 cycles.  She was changed from Doxorubicin to Epirubicin due to a rash.  She is here today for cycle 1 of weekly Paclitaxel and Carboplatin.  This will be given weekly x 12 weeks.    REVIEW OF SYSTEMS: [name] is feeling well overall.  She has a mild stomach ache.  She says this is a generalized achiness.  She is having occasional constipation.  She denies any other issues such as fever,chills, chest pain, palpitations, cough, shortness of breath, bowel/bladder changes, headaches, vision issues, or any other concerns.  A detailed ROS was otherwise non contributory.  HISTORY OF CURRENT ILLNESS: From the original intake note:  [name] herself palpated a left beast lump with associated pain. She underwent bilateral diagnostic mammography with tomography and left breast ultrasonography at [facility] on 01/23/2020 showing: breast density category B; palpable 3.6 cm left breast mass with associated calcifications at 1 o'clock; 0.8 cm left  breast mass at 2 o'clock, concerning for abnormal intramammary lymph node; at least two abnormal left axillary lymph nodes.  Accordingly on 01/23/2020 she proceeded to biopsy of the left breast area in question. The pathology from this procedure ([id number]) showed: invasive ductal carcinoma at 1 o'clock, grade 3. Prognostic indicators significant for: estrogen receptor, 20% positive with moderate staining intensity and progesterone receptor, 0% negative. Proliferation marker Ki67 at 40%. HER2 negative by immunohistochemistry (1+).  The left axillary lymph node confirmed metastatic carcinoma.  The left intramammary lymph node was negative for carcinoma.  The patient's subsequent history is as detailed below.   PAST MEDICAL HISTORY: Past Medical History:  Diagnosis Date  . Allergy   . Asthma   . Chronic back pain   . Hx of migraines   . Medical history non-contributory     PAST SURGICAL HISTORY: Past Surgical History:  Procedure Laterality Date  . CESAREAN SECTION WITH BILATERAL TUBAL LIGATION Bilateral 07/23/2015   Procedure: CESAREAN SECTION WITH BILATERAL TUBAL LIGATION;  Surgeon: Donnamae Jude, MD;  Location: [facility];  Service: Obstetrics;  Laterality: Bilateral;  . PORTACATH PLACEMENT Right 02/11/2020   Procedure: INSERTION PORT-A-CATH WITH ULTRASOUND GUIDANCE;  Surgeon: Rolm Bookbinder, MD;  Location: [facility];  Service: General;  Laterality: Right;  . TUBAL LIGATION      FAMILY HISTORY: No family history on file.  Her parents are both living as of 01/2020, her father age 58 and her mother age 34. She has three maternal half-siblings (1 sister, 2 brothers) and four paternal half-siblings (1 sister, 3 brothers). There is no family history of cancer to her knowledge.   GYNECOLOGIC HISTORY:  Patient's last  menstrual period was 01/20/2020 (exact date). Menarche: 44 years old Age at first live birth: 44 years old GX P 3 LMP 01/2020 Contraceptive s/p tubal  ligation HRT n/a  Hysterectomy? no BSO? 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 Husband Feliz Beam works for a Holiday representative, both in [employer]. They are both from [place]. She lives at home with [name] and their three children-- [name], [name], and [name]. She attends an [employer].    ADVANCED DIRECTIVES: In the absence of any documentation to the contrary, the patient's spouse is their HCPOA.    HEALTH MAINTENANCE: Social History   Tobacco Use  . Smoking status: Never Smoker  . Smokeless tobacco: Never Used  Vaping Use  . Vaping Use: Never used  Substance Use Topics  . Alcohol use: No  . Drug use: No     Colonoscopy: n/a (age)  PAP: 11/2017, negative (per patient)  Bone density: n/a (age)   Allergies  Allergen Reactions  . Doxorubicin Hcl Rash    Experience chest tightness, abdominal pain, and redness around lips during and shortly after doxorubicin bolus.     Current Outpatient Medications  Medication Sig Dispense Refill  . albuterol (VENTOLIN HFA) 108 (90 Base) MCG/ACT inhaler Inhale into the lungs every 6 (six) hours as needed for wheezing or shortness of breath.    Marland Kitchen azelastine (ASTELIN) 0.1 % nasal spray Place 2 sprays into both nostrils 2 (two) times daily. Use in each nostril as directed 30 mL 12  . dexamethasone (DECADRON) 4 MG tablet Take 2 tablets (8 mg) by mouth twice daily with food starting the day after chemotherapy (day 2), continue day3, take the last dose the morning of day 4 30 tablet 1  . fluticasone (FLONASE) 50 MCG/ACT nasal spray Place 2 sprays into both nostrils daily. 16 g 2  . lidocaine-prilocaine (EMLA) cream Apply to affected area once 30 g 3  . LORazepam (ATIVAN) 0.5 MG tablet Take 1 tablet at bedtime on days 1 ,2 and 3 of each chemotherapy treatment, then at bedtime as needed 30 tablet 0  . magic mouthwash SOLN Take 5 mLs by  mouth 3 (three) times daily as needed for mouth pain. 240 mL 0  . pantoprazole (PROTONIX) 40 MG tablet Take 1 tablet (40 mg total) by mouth daily. 30 tablet 1  . sodium chloride (OCEAN) 0.65 % SOLN nasal spray Place 2 sprays into both nostrils as needed for congestion. 30 mL 0  . valACYclovir (VALTREX) 500 MG tablet Take 1 tablet (500 mg total) by mouth 2 (two) times daily. 60 tablet 1  . prochlorperazine (COMPAZINE) 10 MG tablet Take 1 tablet (10 mg total) by mouth 3 (three) times daily before meals for 3 days. 30 tablet 1   No current facility-administered medications for this visit.    OBJECTIVE:   Vitals:   04/08/20 1125  BP: 110/69  Pulse: 78  Resp: 18  Temp: 97.8 F (36.6 C)  SpO2: 100%     Body mass index is 34.93 kg/m.   Wt Readings from Last 3 Encounters:  04/08/20 191 lb (86.6 kg)  04/01/20 188 lb 9.6 oz (85.5 kg)  03/25/20 191 lb 4.8 oz (86.8 kg)      ECOG FS:1 - Symptomatic but completely ambulatory GENERAL: Patient is a well appearing female in no acute distress HEENT:  Sclerae anicteric. NO oropharyngeal ulceration or candida noted Neck is supple.  NODES:  No cervical, supraclavicular, or axillary lymphadenopathy palpated.  BREAST EXAM:  Difficulty palpating left breast mass, no progression noted LUNGS:  Clear to auscultation bilaterally.  No wheezes or rhonchi. HEART:  Regular rate and rhythm. No murmur appreciated. ABDOMEN:  Soft, nontender.  Positive, normoactive bowel sounds. No organomegaly palpated. MSK:  No focal spinal tenderness to palpation. Full range of motion bilaterally in the upper extremities. EXTREMITIES:  No peripheral edema.   SKIN:  Clear with no obvious rashes or skin changes. No nail dyscrasia. NEURO:  Nonfocal. Well oriented.  Appropriate affect.     LAB RESULTS:  CMP     Component Value Date/Time   NA 140 04/01/2020 1256   NA 138 08/15/2017 1525   K 4.0 04/01/2020 1256   CL 107 04/01/2020 1256   CO2 25 04/01/2020 1256    GLUCOSE 100 (H) 04/01/2020 1256   BUN 15 04/01/2020 1256   BUN 12 08/15/2017 1525   CREATININE 0.75 04/01/2020 1256   CREATININE 0.77 02/26/2020 0915   CALCIUM 10.1 04/01/2020 1256   PROT 6.7 04/01/2020 1256   PROT 7.4 01/03/2017 1642   ALBUMIN 3.6 04/01/2020 1256   ALBUMIN 4.3 01/03/2017 1642   AST 12 (L) 04/01/2020 1256   AST 17 02/26/2020 0915   ALT 16 04/01/2020 1256   ALT 21 02/26/2020 0915   ALKPHOS 126 04/01/2020 1256   BILITOT <0.2 (L) 04/01/2020 1256   BILITOT <0.2 (L) 02/26/2020 0915   GFRNONAA >60 04/01/2020 1256   GFRNONAA >60 02/26/2020 0915   GFRAA >60 04/01/2020 1256   GFRAA >60 02/26/2020 0915    No results found for: TOTALPROTELP, ALBUMINELP, A1GS, A2GS, BETS, BETA2SER, GAMS, MSPIKE, SPEI  Lab Results  Component Value Date   WBC 7.5 04/08/2020   NEUTROABS 4.8 04/08/2020   HGB 10.3 (L) 04/08/2020   HCT 31.6 (L) 04/08/2020   MCV 86.8 04/08/2020   PLT 148 (L) 04/08/2020    No results found for: LABCA2  No components found for: QVZDGL875  No results for input(s): INR in the last 168 hours.  No results found for: LABCA2  No results found for: IEP329  No results found for: JJO841  No results found for: YSA630  No results found for: CA2729  No components found for: HGQUANT  No results found for: CEA1 / No results found for: CEA1   No results found for: AFPTUMOR  No results found for: CHROMOGRNA  No results found for: KPAFRELGTCHN, LAMBDASER, KAPLAMBRATIO (kappa/lambda light chains)  No results found for: HGBA, HGBA2QUANT, HGBFQUANT, HGBSQUAN (Hemoglobinopathy evaluation)   No results found for: LDH  No results found for: IRON, TIBC, IRONPCTSAT (Iron and TIBC)  No results found for: FERRITIN  Urinalysis No results found for: COLORURINE, APPEARANCEUR, LABSPEC, PHURINE, GLUCOSEU, HGBUR, BILIRUBINUR, KETONESUR, PROTEINUR, UROBILINOGEN, NITRITE, LEUKOCYTESUR   STUDIES: DG Chest 2 View  Result Date: 03/20/2020 CLINICAL DATA:  Cough.   History of breast carcinoma EXAM: CHEST - 2 VIEW COMPARISON:  February 11, 2020 FINDINGS: Port-A-Cath tip is in the superior vena cava. No pneumothorax. The lungs are clear. The heart size and pulmonary vascularity are normal. No adenopathy. No bone lesions. IMPRESSION: Port-A-Cath tip in superior vena cava. No pneumothorax. Lungs clear. No evident adenopathy. Electronically Signed   By: Lowella Grip III M.D.   On: 03/20/2020 15:16   DG Abd 2 Views  Result Date: 03/20/2020 CLINICAL DATA:  Lower abdominal pain.  History of breast carcinoma EXAM: ABDOMEN - 2 VIEW COMPARISON:  None. FINDINGS: Supine and  upright images were obtained. There is moderate stool in the colon. There is no bowel dilatation or air-fluid level to suggest bowel obstruction. No free air. Tubal ligation clips are present in the pelvis bilaterally. Lung bases are clear. IMPRESSION: Moderate stool in colon.  No bowel obstruction or free air. Electronically Signed   By: Lowella Grip III M.D.   On: 03/20/2020 15:15     ELIGIBLE FOR AVAILABLE RESEARCH PROTOCOL: Q0347?  ASSESSMENT: 44 y.o. [place] woman status post left breast upper outer quadrant biopsy 01/23/2020 for a clinical T3 N1, 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  (a) chest CT scan and bone scan 02/07/2020 showed no evidence of metastatic disease  (1) neoadjuvant chemotherapy will consist of doxorubicin and cyclophosphamide in dose dense fashion x4 starting 02/12/2020 to be followed by paclitaxel and carboplatin weekly x12  (a) echo 02/07/2020 shows an ejection fraction in the 55-60% range  (b) Epirubicin substituted for Doxorubicin secondary to allergic rash from Doxorubicin  (2) definitive surgery to follow  (3) adjuvant radiation  (4) genetics testing  (a) Negative genetic testing. No pathogenic variants identified on the [facility] Common Hereditary Cancers Panel. VUS in MSH6 called c.2744C>G identified.  The report date is 02/07/2020.  The Common Hereditary Cancers Panel offered by [facility] includes sequencing and/or deletion duplication testing of the following 48 genes: APC, ATM, AXIN2, BARD1, BMPR1A, BRCA1, BRCA2, BRIP1, CDH1, CDKN2A (p14ARF), CDKN2A (p16INK4a), CKD4, CHEK2, CTNNA1, DICER1, EPCAM (Deletion/duplication testing only), GREM1 (promoter region deletion/duplication testing only), KIT, MEN1, MLH1, MSH2, MSH3, MSH6, MUTYH, NBN, NF1, NHTL1, PALB2, PDGFRA, PMS2, POLD1, POLE, PTEN, RAD50, RAD51C, RAD51D, RNF43, SDHB, SDHC, SDHD, SMAD4, SMARCA4. STK11, TP53, TSC1, TSC2, and VHL.  The following genes were evaluated for sequence changes only: SDHA and HOXB13 c.251G>A variant only.   PLAN: Alyzabeth will start treatment today with weekly Paclitaxel and Carbopatlin.  We reviewed common adverse effects and she understands the risks and benefits and is willing to proceed with treatment.  I counseled her specifically on peripheral neuropathy.  We are having difficulty palpating definite breast indicating clinical response to treatment.  Axillary adenopathy remains but softer.    I recommended that she continue omeprazole for her generalized abdominal discomfort and Senokot and Miralax for constipation.  She will return in 1 week for labs, f/u and cycle two of treatment.    She knows to call for any questions that may arise between now and her next appointment.  We are happy to see her sooner if needed.   Total encounter time 20 minutes*  Wilber Bihari, NP 04/08/20 11:52 AM Medical Oncology and Hematology [facility] [place], [place] [address] Tel. [phone]    Fax. [phone]  *Total Encounter Time as defined by the Centers for Medicare and Medicaid Services includes, in addition to the face-to-face time of a patient visit (documented in the note above) non-face-to-face time: obtaining and reviewing outside history, ordering and reviewing medications, tests or  procedures, care coordination (communications with other health care professionals or caregivers) and documentation in the medical record.

## 2020-04-07 NOTE — Telephone Encounter (Signed)
Received [employer] Medicaid application for patient. Called patient with Spanish interpreter [name]. Patient is not a [place] Citizen. Let patient know that she is not eligible for [employer] Medicaid. Patient given contact information for financial assistance advocates at the [facility].Patient verbalized understanding.

## 2020-04-08 ENCOUNTER — Encounter: Payer: Self-pay | Admitting: Oncology

## 2020-04-08 ENCOUNTER — Other Ambulatory Visit: Payer: Self-pay

## 2020-04-08 ENCOUNTER — Inpatient Hospital Stay: Payer: Self-pay

## 2020-04-08 ENCOUNTER — Inpatient Hospital Stay (HOSPITAL_BASED_OUTPATIENT_CLINIC_OR_DEPARTMENT_OTHER): Payer: Self-pay | Admitting: Adult Health

## 2020-04-08 VITALS — BP 110/69 | HR 78 | Temp 97.8°F | Resp 18 | Ht 62.0 in | Wt 191.0 lb

## 2020-04-08 VITALS — BP 118/50 | HR 76 | Resp 16

## 2020-04-08 DIAGNOSIS — Z95828 Presence of other vascular implants and grafts: Secondary | ICD-10-CM

## 2020-04-08 DIAGNOSIS — C50412 Malignant neoplasm of upper-outer quadrant of left female breast: Secondary | ICD-10-CM

## 2020-04-08 DIAGNOSIS — Z170 Estrogen receptor positive status [ER+]: Secondary | ICD-10-CM

## 2020-04-08 LAB — COMPREHENSIVE METABOLIC PANEL
ALT: 16 U/L (ref 0–44)
AST: 17 U/L (ref 15–41)
Albumin: 3.6 g/dL (ref 3.5–5.0)
Alkaline Phosphatase: 126 U/L (ref 38–126)
Anion gap: 7 (ref 5–15)
BUN: 12 mg/dL (ref 6–20)
CO2: 27 mmol/L (ref 22–32)
Calcium: 9.9 mg/dL (ref 8.9–10.3)
Chloride: 107 mmol/L (ref 98–111)
Creatinine, Ser: 0.71 mg/dL (ref 0.44–1.00)
GFR calc Af Amer: >60 mL/min (ref >60)
GFR calc non Af Amer: >60 mL/min (ref >60)
Glucose, Bld: 108 mg/dL — ABNORMAL HIGH (ref 70–99)
Potassium: 4 mmol/L (ref 3.5–5.1)
Sodium: 141 mmol/L (ref 135–145)
Total Bilirubin: 0.2 mg/dL — ABNORMAL LOW (ref 0.3–1.2)
Total Protein: 6.7 g/dL (ref 6.5–8.1)

## 2020-04-08 LAB — CBC WITH DIFFERENTIAL/PLATELET
Abs Immature Granulocytes: 0.9 10*3/uL — ABNORMAL HIGH (ref 0.00–0.07)
Basophils Absolute: 0 10*3/uL (ref 0.0–0.1)
Basophils Relative: 1 %
Eosinophils Absolute: 0 10*3/uL (ref 0.0–0.5)
Eosinophils Relative: 0 %
HCT: 31.6 % — ABNORMAL LOW (ref 36.0–46.0)
Hemoglobin: 10.3 g/dL — ABNORMAL LOW (ref 12.0–15.0)
Immature Granulocytes: 12 %
Lymphocytes Relative: 15 %
Lymphs Abs: 1.2 10*3/uL (ref 0.7–4.0)
MCH: 28.3 pg (ref 26.0–34.0)
MCHC: 32.6 g/dL (ref 30.0–36.0)
MCV: 86.8 fL (ref 80.0–100.0)
Monocytes Absolute: 0.6 10*3/uL (ref 0.1–1.0)
Monocytes Relative: 9 %
Neutro Abs: 4.8 10*3/uL (ref 1.7–7.7)
Neutrophils Relative %: 63 %
Platelets: 148 10*3/uL — ABNORMAL LOW (ref 150–400)
RBC: 3.64 MIL/uL — ABNORMAL LOW (ref 3.87–5.11)
RDW: 15.5 % (ref 11.5–15.5)
WBC: 7.5 10*3/uL (ref 4.0–10.5)
nRBC: 0.7 % — ABNORMAL HIGH (ref 0.0–0.2)

## 2020-04-08 MED ORDER — FAMOTIDINE IN NACL 20-0.9 MG/50ML-% IV SOLN
20.0000 mg | Freq: Once | INTRAVENOUS | Status: AC
  Administered 2020-04-08: 20 mg via INTRAVENOUS

## 2020-04-08 MED ORDER — PALONOSETRON HCL INJECTION 0.25 MG/5ML
0.2500 mg | Freq: Once | INTRAVENOUS | Status: AC
  Administered 2020-04-08: 0.25 mg via INTRAVENOUS

## 2020-04-08 MED ORDER — HEPARIN SOD (PORK) LOCK FLUSH 100 UNIT/ML IV SOLN
500.0000 [IU] | Freq: Once | INTRAVENOUS | Status: AC | PRN
  Administered 2020-04-08: 500 [IU]
  Filled 2020-04-08: qty 5

## 2020-04-08 MED ORDER — DIPHENHYDRAMINE HCL 50 MG/ML IJ SOLN
25.0000 mg | Freq: Once | INTRAMUSCULAR | Status: AC
  Administered 2020-04-08: 25 mg via INTRAVENOUS

## 2020-04-08 MED ORDER — FAMOTIDINE IN NACL 20-0.9 MG/50ML-% IV SOLN
INTRAVENOUS | Status: AC
  Filled 2020-04-08: qty 50

## 2020-04-08 MED ORDER — SODIUM CHLORIDE 0.9% FLUSH
10.0000 mL | Freq: Once | INTRAVENOUS | Status: AC
  Administered 2020-04-08: 10 mL
  Filled 2020-04-08: qty 10

## 2020-04-08 MED ORDER — DIPHENHYDRAMINE HCL 50 MG/ML IJ SOLN
INTRAMUSCULAR | Status: AC
  Filled 2020-04-08: qty 1

## 2020-04-08 MED ORDER — SODIUM CHLORIDE 0.9 % IV SOLN
80.0000 mg/m2 | Freq: Once | INTRAVENOUS | Status: AC
  Administered 2020-04-08: 156 mg via INTRAVENOUS
  Filled 2020-04-08: qty 26

## 2020-04-08 MED ORDER — SODIUM CHLORIDE 0.9% FLUSH
10.0000 mL | INTRAVENOUS | Status: DC | PRN
  Administered 2020-04-08: 10 mL
  Filled 2020-04-08: qty 10

## 2020-04-08 MED ORDER — SODIUM CHLORIDE 0.9 % IV SOLN
Freq: Once | INTRAVENOUS | Status: AC
  Filled 2020-04-08: qty 250

## 2020-04-08 MED ORDER — SODIUM CHLORIDE 0.9 % IV SOLN
20.0000 mg | Freq: Once | INTRAVENOUS | Status: AC
  Administered 2020-04-08: 20 mg via INTRAVENOUS
  Filled 2020-04-08: qty 20

## 2020-04-08 MED ORDER — PALONOSETRON HCL INJECTION 0.25 MG/5ML
INTRAVENOUS | Status: AC
  Filled 2020-04-08: qty 5

## 2020-04-08 MED ORDER — SODIUM CHLORIDE 0.9 % IV SOLN
300.0000 mg | Freq: Once | INTRAVENOUS | Status: AC
  Administered 2020-04-08: 300 mg via INTRAVENOUS
  Filled 2020-04-08: qty 30

## 2020-04-08 NOTE — Patient Instructions (Signed)
Gua de cuidados en el hogar del dispositivo de perfusin implantable Implanted [place] Guide Un dispositivo de perfusin implantable es un dispositivo que se coloca debajo de la piel. Por lo general, se coloca en el pecho. Puede usarse para suministrar medicamentos intravenosos, tomar muestras de sangre o realizar dilisis. Puede tener un dispositivo implantable si:  Necesita administrarse un medicamento intravenoso que podra provocar una irritacin en las venas pequeas de las manos o los brazos.  Necesita medicamentos por va intravenosa a largo plazo, como antibiticos.  Necesita recibir nutricin por va intravenosa durante un largo perodo.  Debe someterse a dilisis. Si tiene un dispositivo, su mdico no tendr que recurrir a las venas de sus brazos para estos procedimientos. Puede tener algunas limitaciones ms al usar el dispositivo que si usara otro tipo de vas intravenosas a largo plazo, y es probable que pueda retomar sus actividades normales cuando cure la incisin. Un dispositivo de perfusin implantable tiene dos partes principales:  Depsito. El depsito es la parte en donde se inserta la aguja para administrar los medicamentos o extraer sangre. El depsito es redondo. Luego de colocado, se ve como un rea pequea y elevada debajo de su piel.  Catter. El catter es un tubo delgado y flexible que conecta el depsito a una vena. Los medicamentos que se introducen en el depsito, pasan a travs del catter y luego llegan a la vena. Cmo se accede al dispositivo de perfusin? Para acceder al dispositivo:  Puede que se le coloque crema anestsica sobre la piel encima del dispositivo.  El mdico se colocar una mscara y guantes estriles.  La piel sobre el dispositivo se limpiar con cuidado con un jabn antisptico y se dejar secar.  Su mdico pellizcar suavemente el dispositivo e insertar una aguja dentro.  Su mdico revisar el retorno de sangre para garantizar que  el dispositivo est en la vena y no est obstruido.  Si el dispositivo debe tener accesibilidad para un suministro continuo de medicamentos (infusin constante), su mdico colocar un vendaje (venda) claro sobre el lugar donde se coloc la aguja. El vendaje y la aguja debern cambiarse todas las semanas o segn las indicaciones del mdico. Qu es el purgado? El purgado ayuda a que el dispositivo no se obstruya. Siga las indicaciones del mdico acerca de cmo y cundo purgar el dispositivo. Los dispositivos de perfusin generalmente se purgan con una solucin salina o un medicamento llamado heparina. La necesidad de purgar depender de cmo se use el dispositivo:  Si se usa solo eventualmente para suministrar medicamentos o extraer sangre, se debe purgar el dispositivo: ? Antes y despus de la administracin de los medicamentos. ? Antes y despus de una extraccin de sangre. ? Como parte de una rutina de mantenimiento. Es recomendable que se purgue cada 4 o 6 semanas.  Si la infusin es constante, no necesitar limpiarlo.  Deseche las agujas y las jeringas en un contenedor para desechos destinado a objetos punzantes(recipiente para objetos punzantes). Puede comprar un recipiente para objetos punzantes en una farmacia, o puede hacer uno usted mismo con una botella de plstico rgida y un cobertor. Durante cunto tiempo tendr implantado el dispositivo? El dispositivo puede permanecer implantado por el tiempo que el mdico considere necesario. Cuando llega el momento de retirar el dispositivo, deber someterse a una ciruga. Esta ciruga ser similar a la realizada en el momento de la colocacin del dispositivo. Siga estas indicaciones en su casa:   Purgue el dispositivo como se lo haya indicado el   mdico.  Si debe recibir una infusin durante varios das, siga las indicaciones del mdico acerca de cmo cuidar el lugar donde tiene colocado el dispositivo. Haga lo siguiente: ? Lvese las manos  con agua y jabn antes de cambiar el apsito. Use desinfectante para manos con alcohol si no dispone de agua y jabn. ? Cambie el vendaje como se lo haya indicado el mdico. ? Coloque vendas usadas o bolsas de infusin en una bolsa plstica. Tire esa bolsa en el contenedor de basura. ? Mantenga el vendaje que cubre la aguja limpio y seco. No deje que se moje. ? No use tijeras u objetos punzantes cerca del tubo. ? Mantenga el tubo sujetado, excepto cuando se use.  Controle el lugar donde tiene colocado el dispositivo todos los das para detectar signos de infeccin. Est atento a los siguientes signos: ? Dolor, hinchazn o enrojecimiento. ? Lquido o sangre. ? Pus o mal olor.  Proteja la piel alrededor del lugar donde tiene colocado el dispositivo. ? Evite usar sostenes si los breteles rozan o irritan el lugar. ? Proteja la piel alrededor del dispositivo cuando se coloque el cinturn de seguridad. Coloque una almohadilla suave en el pecho, de ser necesario.  Dchese o bese como se lo haya indicado el mdico. Se puede mojar el lugar siempre y cuando no est recibiendo activamente una infusin.  Retome sus actividades normales como se lo haya indicado el mdico. Pregntele al mdico qu actividades son seguras para usted.  Utilice un brazalete o lleve consigo una tarjeta de alerta mdica en todo momento. Esto les permitir a los mdicos saber que tiene un dispositivo implantable en caso de una emergencia. Solicite ayuda de inmediato si:  Tiene enrojecimiento, hinchazn o dolor en el lugar donde tiene el dispositivo.  Le sale una mayor cantidad de lquido o de sangre del lugar donde tiene el dispositivo.  Tiene pus o percibe mal olor que proviene del lugar donde tiene colocado el dispositivo.  Tiene fiebre. Resumen  Por lo general, los dispositivos implantados se colocan en el pecho para un acceso intravenoso a largo plazo.  Siga las indicaciones del mdico sobre cmo purgar el  dispositivo y cambiar las vendas (vendajes).  Cuide el rea alrededor de donde tiene colocado el dispositivo, evite la ropa que presione el rea, y preste atencin a los signos de infeccin.  Proteja la piel alrededor del dispositivo cuando se coloque el cinturn de seguridad. Coloque una almohadilla suave en el pecho, de ser necesario.  Busque ayuda de inmediato si tiene fiebre o enrojecimiento, hinchazn, dolor, drenaje o mal olor en el lugar donde tiene el dispositivo. Esta informacin no tiene como fin reemplazar el consejo del mdico. Asegrese de hacerle al mdico cualquier pregunta que tenga. Document Revised: 06/01/2017 Document Reviewed: 06/01/2017 Elsevier Patient Education  2020 Elsevier Inc.  

## 2020-04-08 NOTE — Progress Notes (Signed)
Patient stopped by to inquire about financial assistance application, I advised her of the conversation with Customer service on 04/07/20, and that they should be contacting her soon. She verbalized understanding.

## 2020-04-08 NOTE — Patient Instructions (Addendum)
[facility] Discharge Instructions for Patients Receiving Chemotherapy  Today you received the following chemotherapy agents: Paclitaxel and Carboplatin  To help prevent nausea and vomiting after your treatment, we encourage you to take your nausea medication as prescribed.    If you develop nausea and vomiting that is not controlled by your nausea medication, call the clinic.   BELOW ARE SYMPTOMS THAT SHOULD BE REPORTED IMMEDIATELY:  *FEVER GREATER THAN 100.5 F  *CHILLS WITH OR WITHOUT FEVER  NAUSEA AND VOMITING THAT IS NOT CONTROLLED WITH YOUR NAUSEA MEDICATION  *UNUSUAL SHORTNESS OF BREATH  *UNUSUAL BRUISING OR BLEEDING  TENDERNESS IN MOUTH AND THROAT WITH OR WITHOUT PRESENCE OF ULCERS  *URINARY PROBLEMS  *BOWEL PROBLEMS  UNUSUAL RASH Items with * indicate a potential emergency and should be followed up as soon as possible.  Feel free to call the clinic should you have any questions or concerns. The clinic phone number is [phone].  Please show the [place] at check-in to the Emergency Department and triage nurse.  Paclitaxel injection Qu es este medicamento? El PACLITAXEL es un agente quimioteraputico. Este medicamento acta sobre las clulas que se dividen rpidamente, como las clulas cancerosas, y finalmente provoca la muerte de estas clulas. Se utiliza en el tratamiento del cncer de ovario, mama, pulmn, sarcoma de [place] y otros tipos de cncer. Este medicamento puede ser utilizado para otros usos; si tiene alguna pregunta consulte con su proveedor de atencin mdica o con su farmacutico. MARCAS COMUNES: Onxol, Taxol Qu le debo informar a mi profesional de la salud antes de tomar este medicamento? Necesitan saber si usted presenta alguno de los siguientes problemas o situaciones: antecedentes de ritmo cardiaco irregular enfermedad heptica recuentos sanguneos bajos, como baja cantidad de glbulos blancos, plaquetas o glbulos  rojos enfermedad pulmonar o respiratoria, como asma hormigueo en las manos o los pies, u otro trastorno del sistema nervioso una reaccin alrgica o inusual al paclitaxel, al alcohol, al aceite de ricino polioxietilado, a otros medicamentos quimioteraputicos, a otros medicamentos, alimentos, colorantes o conservantes si est embarazada o buscando quedar embarazada si est amamantando a un beb Cmo debo [employer]? Este medicamento se administra como infusin en una vena. Un profesional de la salud especialmente capacitado lo administra en un hospital o clnica. Hable con su pediatra para informarse acerca del uso de este medicamento en nios. Puede requerir atencin especial. Sobredosis: Pngase en contacto inmediatamente con un centro toxicolgico o una sala de urgencia si usted cree que haya tomado demasiado medicamento. ATENCIN: [employer] es solo para usted. No comparta este medicamento con nadie. Qu sucede si me olvido de una dosis? Es importante no olvidar ninguna dosis. Informe a su mdico o a su profesional de la salud si no puede asistir a Photographer. Qu puede interactuar con este medicamento? No tome este medicamento con ninguno de los siguientes frmacos: disulfiram metronidazol Esta medicina tambin puede interactuar con los siguientes medicamentos: medicamentos antivirales para la hepatitis, VIH o SIDA ciertos antibiticos, tales como eritromicina y Ambulance person ciertos medicamentos para infecciones micticas, tales como itraconazol y ketoconazol ciertos medicamentos para convulsiones, tales como Bernice, fenobarbital y fenitona gemfibrozil nefazodona rifampicina hierba de [place] Puede ser que esta lista no menciona todas las posibles interacciones. Informe a su profesional de [employer]	[place] de [employer] productos a base de hierbas, medicamentos de [place] o suplementos nutritivos que est tomando. Si usted fuma, consume bebidas alcohlicas o si utiliza  drogas ilegales, indqueselo tambin a su profesional  Algunas sustancias pueden interactuar con su medicamento. A qu debo estar atento al usar este medicamento? Se supervisar su estado de salud atentamente mientras reciba este medicamento. Tendr que hacerse anlisis de sangre importantes mientras est tomando este medicamento. Este medicamento puede causar reacciones alrgicas graves. Para reducir su riesgo, necesitar tomar otro(s) medicamento(s) antes del tratamiento con este medicamento. Si tiene reacciones alrgicas como erupcin cutnea, comezn/picazn o urticaria, hinchazn del rostro, los labios, o la lengua, informe de inmediato a su mdico o profesional de la salud. En algunos casos, podra recibir medicamentos adicionales para ayudarlo con los efectos secundarios. Siga todas las instrucciones para usarlos. Este medicamento podra hacerle sentir un malestar general. Esto es normal ya que la quimioterapia afecta tanto a las clulas sanas como a las clulas cancerosas. Si presenta algn efecto secundario, infrmelo. Sin embargo, contine con el tratamiento aun si se siente enfermo, a menos que su mdico le indique que lo suspenda. Consulte a su mdico o a su profesional de la salud si tiene fiebre, escalofros o dolor de garganta, o cualquier otro sntoma de resfro o gripe. No se trate usted mismo. Este medicamento reduce la capacidad del cuerpo para combatir infecciones. Trate de no acercarse a personas que estn enfermas. Este medicamento podra aumentar el riesgo de moretones o sangrado. Consulte a su mdico o a su profesional de la salud si observa sangrados inusuales. Proceda con cuidado al cepillar sus dientes, usar hilo dental o utilizar palillos para los dientes, ya que podra contraer una infeccin o sangrar con mayor facilidad. Si se somete a algn tratamiento dental, informe a su dentista que est usando este medicamento. Evite tomar productos que contienen aspirina, acetaminofeno,  ibuprofeno, naproxeno o ketoprofeno, a menos que as lo indique su mdico. Estos productos pueden ocultar la fiebre. No debe quedar embarazada mientras reciba este medicamento. Las mujeres deben informar a su mdico si estn buscando quedar embarazadas o si creen que estn embarazadas. Existe la posibilidad de efectos secundarios graves en un beb sin nacer. Para obtener ms informacin, hable con su profesional de la salud o su farmacutico. No debe amamantar a un beb mientras est tomando este medicamento. Para los hombres, se desaconseja tener nios mientras reciben este medicamento. Este producto podra contener alcohol. Pregunte a su farmacutico o a su proveedor de atencin de la salud si este medicamento contiene alcohol. Asegrese de decirles a todos los proveedores de atencin de la salud que usted est tomando este medicamento. Ciertos medicamentos, como metronidazol y disulfiram, pueden causar una reaccin desagradable cuando se toman con alcohol. Esta reaccin incluye enrojecimiento, dolor de cabeza, nuseas, vmitos, sudoracin y aumento de la sed. La reaccin puede durar de 30 minutos a varias horas. Qu efectos secundarios puedo tener al utilizar este medicamento? Efectos secundarios que debe informar a su mdico o a su profesional de la salud tan pronto como sea posible: reacciones alrgicas, como erupcin cutnea, comezn/picazn o urticaria, e hinchazn de la cara, los labios o la lengua problemas respiratorios cambios en la visin ritmo cardiaco rpido, irregular presin sangunea alta o baja llagas en la boca dolor, hormigueo o entumecimiento de las manos o los pies signos de disminucin en la cantidad de plaquetas o sangrado: moretones, puntos rojos en la piel, heces de color negro y aspecto alquitranado, sangre en la orina signos de disminucin en la cantidad de glbulos rojos: cansancio o debilidad inusual, sensacin de desmayo o aturdimiento, cadas signos de infeccin: fiebre o  escalofros, tos, dolor de garganta, dolor o dificultad para orinar   dificultad para orinar signos y sntomas de lesin al hgado, como orina amarilla oscura o [place]; sensacin general de estar enfermo o sntomas gripales; heces claras; prdida del apetito; nuseas; dolor en la regin abdominal superior derecha; cansancio o debilidad inusual; color amarillento de los ojos o la piel hinchazn de tobillos, pies, manos ritmo cardiaco inusualmente lento Efectos secundarios que generalmente no requieren atencin mdica (infrmelos a su mdico o a su profesional de la salud si persisten o si son molestos): diarrea cada del cabello prdida del apetito dolores musculares o articulares nuseas, vmito dolor, enrojecimiento o Actor de la inyeccin cansancio Puede ser que esta lista no menciona todos los posibles efectos secundarios. Comunquese a su mdico por asesoramiento mdico [employer] Usted puede informar los efectos secundarios a la FDA por telfono al 1-800-FDA-1088. Dnde debo guardar mi medicina? Este medicamento se administra en hospitales o clnicas y no necesitar guardarlo en su domicilio. ATENCIN: Este folleto es un resumen. Puede ser que no cubra toda la posible informacin. Si usted tiene preguntas acerca de esta medicina, consulte con su mdico, su farmacutico o su profesional de Technical sales engineer.  2020 Elsevier/Gold Standard (2017-06-22 00:00:00)   Carboplatin injection Qu es este medicamento? El CARBOPLATINO es un agente quimioteraputico. Este medicamento acta sobre las clulas que se dividen rpidamente, como las clulas cancergenas, y finalmente provoca la muerte de estas clulas. Se utiliza en el tratamiento del cncer de ovario y muchos otros tipos de Hotel manager. Este medicamento puede ser utilizado para otros usos; si tiene alguna pregunta consulte con su proveedor de atencin mdica o con su farmacutico. MARCAS COMUNES: Paraplatin Qu le debo informar a mi  profesional de la salud antes de tomar este medicamento? Necesita saber si usted presenta alguno de los siguientes problemas o situaciones:  trastornos sanguneos  problemas auditivos  enfermedad renal  radioterapia reciente o continuada  una reaccin alrgica o inusual al carboplatino, al cisplatino, a otros agentes quimioteraputicos, a otros medicamentos, alimentos, colorantes o conservantes  si est embarazada o buscando quedar embarazada  si est amamantando a un beb Cmo debo [employer]? Este medicamento se administra normalmente mediante infusin por va intravenosa. Lo administra un profesional de la salud calificado en un hospital o en un entorno clnico. Hable con su pediatra para informarse acerca del uso de este medicamento en nios. Puede requerir atencin especial. Sobredosis: Pngase en contacto inmediatamente con un centro toxicolgico o una sala de urgencia si usted cree que haya tomado demasiado medicamento. ATENCIN: [employer] es solo para usted. No comparta este medicamento con nadie. Qu sucede si me olvido de una dosis? Es importante no olvidar ninguna dosis. Informe a su mdico o a su profesional de la salud si no puede asistir a Photographer. Qu puede interactuar con este medicamento?  medicamentos para convulsiones  medicamentos para incrementar los conteos sanguneos, tales como filgrastim, pegfilgrastim, sargramostim  ciertos antibiticos, tales como amicacina, gentamicina, neomicina, estreptomicina, tobramicina  vacunas Consulte a su mdico o a su profesional de la salud antes de tomar cualquiera de los siguientes medicamentos:  acetaminofeno  aspirina  ibuprofeno  quetoprofeno  naproxeno Puede ser que esta lista no menciona todas las posibles interacciones. Informe a su profesional de [employer]	[place] de [employer] productos a base de hierbas, medicamentos de [place] o suplementos nutritivos que est tomando. Si usted fuma,  consume bebidas alcohlicas o si utiliza drogas ilegales, indqueselo tambin a su profesional de [employer]	[place]. Algunas sustancias pueden interactuar con su  medicamento. A qu debo estar atento al usar [employer]? Se supervisar su estado de salud atentamente mientras reciba este medicamento. Tendr que hacerse anlisis de sangre peridicos mientras est tomando este medicamento. Este medicamento puede hacerle sentir un Nurse, mental health. Esto es normal ya que la quimioterapia afecta tanto a las clulas sanas como a las clulas cancerosas. Si presenta alguno de los [employer], infrmelos. Sin embargo, contine con el tratamiento aun si se siente enfermo, a menos que su mdico le indique que lo suspenda. En algunos casos, podr recibir [employer] para ayudarle con los efectos secundarios. Siga las instrucciones para usarlos. Consulte a su mdico o a su profesional de la salud por asesoramiento si tiene fiebre, escalofros, dolor de garganta o cualquier otro sntoma de resfro o gripe. No se trate usted mismo. Este medicamento puede reducir la capacidad del cuerpo para combatir infecciones. Trate de no acercarse a personas que estn enfermas. [employer] puede aumentar el riesgo de magulladuras o sangrado. Consulte a su mdico o a su profesional de la salud si observa sangrados inusuales. Proceda con cuidado al cepillar sus dientes, usar hilo dental o Risk manager palillos para los dientes, ya que puede contraer una infeccin o Therapist, art con mayor facilidad. Si se somete a algn tratamiento dental, informe a su dentista que est [employer]. Evite tomar productos que contienen aspirina, acetaminofeno, ibuprofeno, naproxeno o quetoprofeno a menos que as lo indique su mdico. Estos productos pueden disimular la fiebre. No se debe quedar embarazada mientras recibe este medicamento. Las mujeres deben informar a su mdico si estn buscando quedar embarazadas o si creen que  estn embarazadas. Existe la posibilidad de efectos secundarios graves a un beb sin nacer. Para ms informacin hable con su profesional de la salud o su farmacutico. No debe Economist a un beb mientras est usando este medicamento. Qu efectos secundarios puedo tener al [employer] este medicamento? Efectos secundarios que debe informar a su mdico o a Barrister's clerk de la salud tan pronto como sea posible:  Chief of Staff como erupcin cutnea, picazn o urticarias, hinchazn de la cara, labios o lengua  signos de infeccin - fiebre o escalofros, tos, dolor de garganta, dolor o dificultad para orinar  signos de reduccin de plaquetas o sangrado - magulladuras, puntos rojos en la piel, heces de color oscuro o con aspecto alquitranado, sangrando por la nariz  signos de reduccin de glbulos rojos - cansancio o debilidad inusual, desmayos, sensacin de [employer]  problemas respiratorios  cambios de audicin  cambios en la visin  dolor en el pecho  alta presin sangunea  conteos sanguneos bajos - Este medicamento puede reducir la cantidad de glbulos blancos, glbulos rojos y plaquetas. Su riesgo de infeccin y [place].  nuseas, vmito  dolor, enrojecimiento, hinchazn o irritacin en el lugar de la inyeccin  dolor, hormigueo, entumecimiento de manos o pies  problemas de coordinacin, del habla, al caminar  dificultad para orinar o cambios en el volumen de orina Efectos secundarios que, por lo general, no requieren atencin mdica (debe informarlos a su mdico o a su profesional de la salud si persisten o si son molestos):  cada del cabello  prdida del apetito  sabor metlico o cambios en el sentido del gusto Puede ser que esta lista no menciona todos los posibles efectos secundarios. Comunquese a su mdico por asesoramiento mdico [employer] Usted puede informar los efectos secundarios a la FDA por telfono al  1-800-FDA-1088. Dnde debo guardar mi medicina? [employer]  medicamento se administra en hospitales o clnicas y no necesitar guardarlo en su domicilio. ATENCIN: Este folleto es un resumen. Puede ser que no cubra toda la posible informacin. Si usted tiene preguntas acerca de esta medicina, consulte con su mdico, su farmacutico o su profesional de Technical sales engineer.  2020 Elsevier/Gold Standard (2014-10-21 00:00:00)

## 2020-04-09 ENCOUNTER — Telehealth: Payer: Self-pay | Admitting: Adult Health

## 2020-04-09 NOTE — Telephone Encounter (Signed)
No 7/28 los. No changes made to pt's schedule.

## 2020-04-10 ENCOUNTER — Encounter: Payer: Self-pay | Admitting: Adult Health

## 2020-04-13 NOTE — Telephone Encounter (Signed)
-----   Message from Jolaine Click, RN sent at 04/08/2020  2:15 PM EDT ----- Regarding: First Time Paclitaxel and Carboplatin - Dr. Jana Hakim Patient First Time Paclitaxel and Carboplatin - Dr. Jana Hakim Patient

## 2020-04-13 NOTE — Telephone Encounter (Signed)
Called pt to see how she did with her last treatment & she denies any problems & says she is doing well.

## 2020-04-15 ENCOUNTER — Encounter: Payer: Self-pay | Admitting: Oncology

## 2020-04-15 ENCOUNTER — Other Ambulatory Visit: Payer: Self-pay

## 2020-04-15 ENCOUNTER — Inpatient Hospital Stay: Payer: No Typology Code available for payment source

## 2020-04-15 ENCOUNTER — Inpatient Hospital Stay (HOSPITAL_BASED_OUTPATIENT_CLINIC_OR_DEPARTMENT_OTHER): Payer: No Typology Code available for payment source | Admitting: Adult Health

## 2020-04-15 ENCOUNTER — Inpatient Hospital Stay: Payer: Self-pay | Attending: Oncology

## 2020-04-15 ENCOUNTER — Encounter: Payer: Self-pay | Admitting: *Deleted

## 2020-04-15 ENCOUNTER — Encounter: Payer: Self-pay | Admitting: Adult Health

## 2020-04-15 VITALS — BP 93/49 | HR 85 | Temp 98.0°F | Resp 16 | Ht 62.0 in | Wt 190.9 lb

## 2020-04-15 VITALS — BP 95/63 | HR 75

## 2020-04-15 DIAGNOSIS — Z170 Estrogen receptor positive status [ER+]: Secondary | ICD-10-CM

## 2020-04-15 DIAGNOSIS — Z171 Estrogen receptor negative status [ER-]: Secondary | ICD-10-CM | POA: Insufficient documentation

## 2020-04-15 DIAGNOSIS — Z5111 Encounter for antineoplastic chemotherapy: Secondary | ICD-10-CM | POA: Insufficient documentation

## 2020-04-15 DIAGNOSIS — C50412 Malignant neoplasm of upper-outer quadrant of left female breast: Secondary | ICD-10-CM

## 2020-04-15 DIAGNOSIS — Z79899 Other long term (current) drug therapy: Secondary | ICD-10-CM | POA: Insufficient documentation

## 2020-04-15 DIAGNOSIS — Z95828 Presence of other vascular implants and grafts: Secondary | ICD-10-CM

## 2020-04-15 DIAGNOSIS — R21 Rash and other nonspecific skin eruption: Secondary | ICD-10-CM | POA: Insufficient documentation

## 2020-04-15 LAB — COMPREHENSIVE METABOLIC PANEL
ALT: 24 U/L (ref 0–44)
AST: 19 U/L (ref 15–41)
Albumin: 3.6 g/dL (ref 3.5–5.0)
Alkaline Phosphatase: 84 U/L (ref 38–126)
Anion gap: 7 (ref 5–15)
BUN: 11 mg/dL (ref 6–20)
CO2: 27 mmol/L (ref 22–32)
Calcium: 9.9 mg/dL (ref 8.9–10.3)
Chloride: 106 mmol/L (ref 98–111)
Creatinine, Ser: 0.67 mg/dL (ref 0.44–1.00)
GFR calc Af Amer: >60 mL/min (ref >60)
GFR calc non Af Amer: >60 mL/min (ref >60)
Glucose, Bld: 104 mg/dL — ABNORMAL HIGH (ref 70–99)
Potassium: 3.8 mmol/L (ref 3.5–5.1)
Sodium: 140 mmol/L (ref 135–145)
Total Bilirubin: <0.2 mg/dL — ABNORMAL LOW (ref 0.3–1.2)
Total Protein: 6.4 g/dL — ABNORMAL LOW (ref 6.5–8.1)

## 2020-04-15 LAB — CBC WITH DIFFERENTIAL/PLATELET
Abs Immature Granulocytes: 0.08 10*3/uL — ABNORMAL HIGH (ref 0.00–0.07)
Basophils Absolute: 0 10*3/uL (ref 0.0–0.1)
Basophils Relative: 1 %
Eosinophils Absolute: 0 10*3/uL (ref 0.0–0.5)
Eosinophils Relative: 0 %
HCT: 29.8 % — ABNORMAL LOW (ref 36.0–46.0)
Hemoglobin: 9.9 g/dL — ABNORMAL LOW (ref 12.0–15.0)
Immature Granulocytes: 2 %
Lymphocytes Relative: 21 %
Lymphs Abs: 0.9 10*3/uL (ref 0.7–4.0)
MCH: 28.6 pg (ref 26.0–34.0)
MCHC: 33.2 g/dL (ref 30.0–36.0)
MCV: 86.1 fL (ref 80.0–100.0)
Monocytes Absolute: 0.4 10*3/uL (ref 0.1–1.0)
Monocytes Relative: 8 %
Neutro Abs: 3.1 10*3/uL (ref 1.7–7.7)
Neutrophils Relative %: 68 %
Platelets: 313 10*3/uL (ref 150–400)
RBC: 3.46 MIL/uL — ABNORMAL LOW (ref 3.87–5.11)
RDW: 15.7 % — ABNORMAL HIGH (ref 11.5–15.5)
WBC: 4.5 10*3/uL (ref 4.0–10.5)
nRBC: 0 % (ref 0.0–0.2)

## 2020-04-15 MED ORDER — SODIUM CHLORIDE 0.9% FLUSH
10.0000 mL | Freq: Once | INTRAVENOUS | Status: AC
  Administered 2020-04-15: 10 mL
  Filled 2020-04-15: qty 10

## 2020-04-15 MED ORDER — FAMOTIDINE IN NACL 20-0.9 MG/50ML-% IV SOLN
20.0000 mg | Freq: Once | INTRAVENOUS | Status: AC
  Administered 2020-04-15: 20 mg via INTRAVENOUS

## 2020-04-15 MED ORDER — DIPHENHYDRAMINE HCL 50 MG/ML IJ SOLN
25.0000 mg | Freq: Once | INTRAMUSCULAR | Status: AC
  Administered 2020-04-15: 25 mg via INTRAVENOUS

## 2020-04-15 MED ORDER — SODIUM CHLORIDE 0.9 % IV SOLN
10.0000 mg | Freq: Once | INTRAVENOUS | Status: AC
  Administered 2020-04-15: 10 mg via INTRAVENOUS
  Filled 2020-04-15: qty 10

## 2020-04-15 MED ORDER — PALONOSETRON HCL INJECTION 0.25 MG/5ML
INTRAVENOUS | Status: AC
  Filled 2020-04-15: qty 5

## 2020-04-15 MED ORDER — SODIUM CHLORIDE 0.9 % IV SOLN
80.0000 mg/m2 | Freq: Once | INTRAVENOUS | Status: AC
  Administered 2020-04-15: 156 mg via INTRAVENOUS
  Filled 2020-04-15: qty 26

## 2020-04-15 MED ORDER — HEPARIN SOD (PORK) LOCK FLUSH 100 UNIT/ML IV SOLN
500.0000 [IU] | Freq: Once | INTRAVENOUS | Status: AC | PRN
  Administered 2020-04-15: 500 [IU]
  Filled 2020-04-15: qty 5

## 2020-04-15 MED ORDER — SODIUM CHLORIDE 0.9% FLUSH
10.0000 mL | INTRAVENOUS | Status: DC | PRN
  Administered 2020-04-15: 10 mL
  Filled 2020-04-15: qty 10

## 2020-04-15 MED ORDER — DIPHENHYDRAMINE HCL 50 MG/ML IJ SOLN
INTRAMUSCULAR | Status: AC
  Filled 2020-04-15: qty 1

## 2020-04-15 MED ORDER — FAMOTIDINE IN NACL 20-0.9 MG/50ML-% IV SOLN
INTRAVENOUS | Status: AC
  Filled 2020-04-15: qty 50

## 2020-04-15 MED ORDER — SODIUM CHLORIDE 0.9 % IV SOLN
Freq: Once | INTRAVENOUS | Status: AC
  Filled 2020-04-15: qty 250

## 2020-04-15 MED ORDER — SODIUM CHLORIDE 0.9 % IV SOLN
300.0000 mg | Freq: Once | INTRAVENOUS | Status: AC
  Administered 2020-04-15: 300 mg via INTRAVENOUS
  Filled 2020-04-15: qty 30

## 2020-04-15 MED ORDER — PALONOSETRON HCL INJECTION 0.25 MG/5ML
0.2500 mg | Freq: Once | INTRAVENOUS | Status: AC
  Administered 2020-04-15: 0.25 mg via INTRAVENOUS

## 2020-04-15 NOTE — Progress Notes (Signed)
[place]  [phone] [phone]     ID: [name] DOB: [birth year]  MR#: [id number]  ZHG#:[id number]  Patient Care Team: Rutherford Guys, MD as PCP - General (Family Medicine) Rolm Bookbinder, MD as Consulting Physician (General Surgery) Magrinat, Virgie Dad, MD as Consulting Physician (Oncology) Eppie Gibson, MD as Attending Physician (Radiation Oncology) Rockwell Germany, RN as Oncology Nurse Navigator Mauro Kaufmann, RN as Oncology Nurse Navigator Scot Dock, NP OTHER MD:  CHIEF COMPLAINT: Triple negative breast cancer  CURRENT TREATMENT: Neoadjuvant chemotherapy   INTERVAL HISTORY:  [name] returns today for follow up and treatment of her triple negative breast cancer.  She is accompanied by [name], a [place] interpreter.  She is undergoing neoadjuvant chemotherapy with Epirubicin and Cyclophosphamide given every 2 weeks x 4 cycles, to be followed by weekly Paclitaxel and Carboplatin x 12 cycles.  She was changed from Doxorubicin to Epirubicin due to a rash.  She is here today for cycle 2 of weekly Paclitaxel and Carboplatin.  This will be given weekly x 12 weeks.    REVIEW OF SYSTEMS: [name] notes that she has been experiencing lower back pain and pain in her groin since undergoing the first cycle of Paclitaxel and Carboplatin.  She denies any neuropathy, fever, chills, chest pain, palpitations, cough, shortness of breath, headaches, vision issues.  Her taste has improved.  She has not had any nausea, vomiting, bowel/bladder issues.    HISTORY OF CURRENT ILLNESS: From the original intake note:  [name] herself palpated a left beast lump with associated pain. She underwent bilateral diagnostic mammography with tomography and left breast ultrasonography at [facility] on 01/23/2020 showing: breast density category B; palpable 3.6 cm left breast mass with associated calcifications at 1 o'clock; 0.8 cm left breast mass  at 2 o'clock, concerning for abnormal intramammary lymph node; at least two abnormal left axillary lymph nodes.  Accordingly on 01/23/2020 she proceeded to biopsy of the left breast area in question. The pathology from this procedure ([id number]) showed: invasive ductal carcinoma at 1 o'clock, grade 3. Prognostic indicators significant for: estrogen receptor, 20% positive with moderate staining intensity and progesterone receptor, 0% negative. Proliferation marker Ki67 at 40%. HER2 negative by immunohistochemistry (1+).  The left axillary lymph node confirmed metastatic carcinoma.  The left intramammary lymph node was negative for carcinoma.  The patient's subsequent history is as detailed below.   PAST MEDICAL HISTORY: Past Medical History:  Diagnosis Date  . Allergy   . Asthma   . Chronic back pain   . Hx of migraines   . Medical history non-contributory     PAST SURGICAL HISTORY: Past Surgical History:  Procedure Laterality Date  . CESAREAN SECTION WITH BILATERAL TUBAL LIGATION Bilateral 07/23/2015   Procedure: CESAREAN SECTION WITH BILATERAL TUBAL LIGATION;  Surgeon: Donnamae Jude, MD;  Location: [facility];  Service: Obstetrics;  Laterality: Bilateral;  . PORTACATH PLACEMENT Right 02/11/2020   Procedure: INSERTION PORT-A-CATH WITH ULTRASOUND GUIDANCE;  Surgeon: Rolm Bookbinder, MD;  Location: [facility];  Service: General;  Laterality: Right;  . TUBAL LIGATION      FAMILY HISTORY: No family history on file.  Her parents are both living as of 01/2020, her father age 44 and her mother age 46. She has three maternal half-siblings (1 sister, 2 brothers) and four paternal half-siblings (1 sister, 3 brothers). There is no family history of cancer to her knowledge.   GYNECOLOGIC HISTORY:  Patient's last menstrual  period was 01/20/2020 (exact date). Menarche: 44 years old Age at first live birth: 44 years old [place] P 3 LMP 01/2020 Contraceptive s/p tubal ligation HRT n/a   Hysterectomy? no BSO? 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 Husband [name] works for a Financial trader, both in [employer]. They are both from [place]. She lives at home with [place] and their three children-- [name], [name], and [name]. She attends an [employer].    ADVANCED DIRECTIVES: In the absence of any documentation to the contrary, the patient's spouse is their HCPOA.    HEALTH MAINTENANCE: Social History   Tobacco Use  . Smoking status: Never Smoker  . Smokeless tobacco: Never Used  Vaping Use  . Vaping Use: Never used  Substance Use Topics  . Alcohol use: No  . Drug use: No     Colonoscopy: n/a (age)  PAP: 11/2017, negative (per patient)  Bone density: n/a (age)   Allergies  Allergen Reactions  . Doxorubicin Hcl Rash    Experience chest tightness, abdominal pain, and redness around lips during and shortly after doxorubicin bolus.     Current Outpatient Medications  Medication Sig Dispense Refill  . albuterol (VENTOLIN HFA) 108 (90 Base) MCG/ACT inhaler Inhale into the lungs every 6 (six) hours as needed for wheezing or shortness of breath.    Marland Kitchen azelastine (ASTELIN) 0.1 % nasal spray Place 2 sprays into both nostrils 2 (two) times daily. Use in each nostril as directed 30 mL 12  . dexamethasone (DECADRON) 4 MG tablet Take 2 tablets (8 mg) by mouth twice daily with food starting the day after chemotherapy (day 2), continue day3, take the last dose the morning of day 4 30 tablet 1  . fluticasone (FLONASE) 50 MCG/ACT nasal spray Place 2 sprays into both nostrils daily. 16 g 2  . lidocaine-prilocaine (EMLA) cream Apply to affected area once 30 g 3  . LORazepam (ATIVAN) 0.5 MG tablet Take 1 tablet at bedtime on days 1 ,2 and 3 of each chemotherapy treatment, then at bedtime as needed 30 tablet 0  . magic mouthwash SOLN Take 5 mLs by mouth 3 (three) times  daily as needed for mouth pain. 240 mL 0  . pantoprazole (PROTONIX) 40 MG tablet Take 1 tablet (40 mg total) by mouth daily. 30 tablet 1  . sodium chloride (OCEAN) 0.65 % SOLN nasal spray Place 2 sprays into both nostrils as needed for congestion. 30 mL 0  . valACYclovir (VALTREX) 500 MG tablet Take 1 tablet (500 mg total) by mouth 2 (two) times daily. 60 tablet 1  . prochlorperazine (COMPAZINE) 10 MG tablet Take 1 tablet (10 mg total) by mouth 3 (three) times daily before meals for 3 days. 30 tablet 1   No current facility-administered medications for this visit.   Facility-Administered Medications Ordered in Other Visits  Medication Dose Route Frequency Provider Last Rate Last Admin  . CARBOplatin (PARAPLATIN) 300 mg in sodium chloride 0.9 % 250 mL chemo infusion  300 mg Intravenous Once Neria Procter, Charlestine Massed, NP      . heparin lock flush 100 unit/mL  500 Units Intracatheter Once PRN Gardenia Phlegm, NP      . sodium chloride flush (NS) 0.9 % injection 10 mL  10 mL Intracatheter PRN Gardenia Phlegm, NP        OBJECTIVE:   Vitals:   04/15/20 1144  BP: (!) 93/49  Pulse: 85  Resp: 16  Temp: 98 F (36.7 C)  SpO2: 100%     Body mass index is 34.92 kg/m.   Wt Readings from Last 3 Encounters:  04/15/20 190 lb 14.4 oz (86.6 kg)  04/08/20 191 lb (86.6 kg)  04/01/20 188 lb 9.6 oz (85.5 kg)      ECOG FS:1 - Symptomatic but completely ambulatory GENERAL: Patient is a well appearing female in no acute distress HEENT:  Sclerae anicteric. NO oropharyngeal ulceration or candida noted Neck is supple.  NODES:  No cervical, supraclavicular, or axillary lymphadenopathy palpated.  BREAST EXAM:  Difficulty palpating left breast mass, no progression noted LUNGS:  Clear to auscultation bilaterally.  No wheezes or rhonchi. HEART:  Regular rate and rhythm. No murmur appreciated. ABDOMEN:  Soft, nontender.  Positive, normoactive bowel sounds. No organomegaly palpated. MSK:  No  focal spinal tenderness to palpation. Full range of motion bilaterally in the upper extremities. EXTREMITIES:  No peripheral edema.   SKIN:  Clear with no obvious rashes or skin changes. No nail dyscrasia. NEURO:  Nonfocal. Well oriented.  Appropriate affect.     LAB RESULTS:  CMP     Component Value Date/Time   NA 140 04/15/2020 1134   NA 138 08/15/2017 1525   K 3.8 04/15/2020 1134   CL 106 04/15/2020 1134   CO2 27 04/15/2020 1134   GLUCOSE 104 (H) 04/15/2020 1134   BUN 11 04/15/2020 1134   BUN 12 08/15/2017 1525   CREATININE 0.67 04/15/2020 1134   CREATININE 0.77 02/26/2020 0915   CALCIUM 9.9 04/15/2020 1134   PROT 6.4 (L) 04/15/2020 1134   PROT 7.4 01/03/2017 1642   ALBUMIN 3.6 04/15/2020 1134   ALBUMIN 4.3 01/03/2017 1642   AST 19 04/15/2020 1134   AST 17 02/26/2020 0915   ALT 24 04/15/2020 1134   ALT 21 02/26/2020 0915   ALKPHOS 84 04/15/2020 1134   BILITOT <0.2 (L) 04/15/2020 1134   BILITOT <0.2 (L) 02/26/2020 0915   GFRNONAA >60 04/15/2020 1134   GFRNONAA >60 02/26/2020 0915   GFRAA >60 04/15/2020 1134   GFRAA >60 02/26/2020 0915    No results found for: TOTALPROTELP, ALBUMINELP, A1GS, A2GS, BETS, BETA2SER, GAMS, MSPIKE, SPEI  Lab Results  Component Value Date   WBC 4.5 04/15/2020   NEUTROABS 3.1 04/15/2020   HGB 9.9 (L) 04/15/2020   HCT 29.8 (L) 04/15/2020   MCV 86.1 04/15/2020   PLT 313 04/15/2020    No results found for: LABCA2  No components found for: DUKGUR427  No results for input(s): INR in the last 168 hours.  No results found for: LABCA2  No results found for: CWC376  No results found for: EGB151  No results found for: VOH607  No results found for: CA2729  No components found for: HGQUANT  No results found for: CEA1 / No results found for: CEA1   No results found for: AFPTUMOR  No results found for: CHROMOGRNA  No results found for: KPAFRELGTCHN, LAMBDASER, KAPLAMBRATIO (kappa/lambda light chains)  No results found for:  HGBA, HGBA2QUANT, HGBFQUANT, HGBSQUAN (Hemoglobinopathy evaluation)   No results found for: LDH  No results found for: IRON, TIBC, IRONPCTSAT (Iron and TIBC)  No results found for: FERRITIN  Urinalysis No results found for: COLORURINE, APPEARANCEUR, LABSPEC, PHURINE, GLUCOSEU, HGBUR, BILIRUBINUR, KETONESUR, PROTEINUR, UROBILINOGEN, NITRITE, LEUKOCYTESUR   STUDIES: DG Chest 2 View  Result Date: 03/20/2020 CLINICAL DATA:  Cough.  History of breast carcinoma EXAM: CHEST - 2 VIEW COMPARISON:  February 11, 2020 FINDINGS: Port-A-Cath tip is in the superior vena cava. No pneumothorax. The lungs are clear. The heart size and pulmonary vascularity are normal. No adenopathy. No bone lesions. IMPRESSION: Port-A-Cath tip in superior vena cava. No pneumothorax. Lungs clear. No evident adenopathy. Electronically Signed   By: Lowella Grip III M.D.   On: 03/20/2020 15:16   DG Abd 2 Views  Result Date: 03/20/2020 CLINICAL DATA:  Lower abdominal pain.  History of breast carcinoma EXAM: ABDOMEN - 2 VIEW COMPARISON:  None. FINDINGS: Supine and upright images were obtained. There is moderate stool in the colon. There is no bowel dilatation or air-fluid level to suggest bowel obstruction. No free air. Tubal ligation clips are present in the pelvis bilaterally. Lung bases are clear. IMPRESSION: Moderate stool in colon.  No bowel obstruction or free air. Electronically Signed   By: Lowella Grip III M.D.   On: 03/20/2020 15:15     ELIGIBLE FOR AVAILABLE RESEARCH PROTOCOL: [id number]?  ASSESSMENT: 44 y.o. [place] woman status post left breast upper outer quadrant biopsy 01/23/2020 for a clinical T3 N1, 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  (a) chest CT scan and bone scan 02/07/2020 showed no evidence of metastatic disease  (1) neoadjuvant chemotherapy will consist of doxorubicin and cyclophosphamide in dose dense fashion x4 starting 02/12/2020 to be  followed by paclitaxel and carboplatin weekly x12  (a) echo 02/07/2020 shows an ejection fraction in the 55-60% range  (b) Epirubicin substituted for Doxorubicin secondary to allergic rash from Doxorubicin  (2) definitive surgery to follow  (3) adjuvant radiation  (4) genetics testing  (a) Negative genetic testing. No pathogenic variants identified on the [facility] Common Hereditary Cancers Panel. VUS in MSH6 called c.2744C>G identified. The report date is 02/07/2020.  The Common Hereditary Cancers Panel offered by [facility] includes sequencing and/or deletion duplication testing of the following 48 genes: APC, ATM, AXIN2, BARD1, BMPR1A, BRCA1, BRCA2, BRIP1, CDH1, CDKN2A (p14ARF), CDKN2A (p16INK4a), CKD4, CHEK2, CTNNA1, DICER1, EPCAM (Deletion/duplication testing only), GREM1 (promoter region deletion/duplication testing only), KIT, MEN1, MLH1, MSH2, MSH3, MSH6, MUTYH, NBN, NF1, NHTL1, PALB2, PDGFRA, PMS2, POLD1, POLE, PTEN, RAD50, RAD51C, RAD51D, RNF43, SDHB, SDHC, SDHD, SMAD4, SMARCA4. STK11, TP53, TSC1, TSC2, and VHL.  The following genes were evaluated for sequence changes only: SDHA and HOXB13 c.251G>A variant only.   PLAN: [name] started her second portion of neaduvant chemotherpay last week with weekly Paclitaxel and Carboplatin.  She has not noted any progression in her breast where the tumor was previously palpated.  Her labs are stable.  The achiness she experienced after chemotherapy is likely secondary to having received her first dose, and typically resolves with subsequent cycles.  She knows to let me know if she continues to have pain, because if she does I will order xrays.    Malva and I talked about diet and exercise today.    We will see her for labs and treatment weekly and she will have f/u with myself or Dr. Jana Hakim every other week.  She understands this.  She knows to call for any questions that may arise between now and her next appointment.  We are happy to see her sooner if  needed.   Total encounter time 20 minutes*  Wilber Bihari, NP 04/15/20 3:02 PM Medical Oncology and Hematology [facility] [place], [place] [address] Tel. [phone]    Fax. [phone]  *Total Encounter Time as defined by the Centers for Medicare  and Medicaid Services includes, in addition to the face-to-face time of a patient visit (documented in the note above) non-face-to-face time: obtaining and reviewing outside history, ordering and reviewing medications, tests or procedures, care coordination (communications with other health care professionals or caregivers) and documentation in the medical record.

## 2020-04-15 NOTE — Patient Instructions (Signed)
[facility] Discharge Instructions for Patients Receiving Chemotherapy  Today you received the following chemotherapy agents: taxol, carboplatin   To help prevent nausea and vomiting after your treatment, we encourage you to take your nausea medication as directed.    If you develop nausea and vomiting that is not controlled by your nausea medication, call the clinic.   BELOW ARE SYMPTOMS THAT SHOULD BE REPORTED IMMEDIATELY:  *FEVER GREATER THAN 100.5 F  *CHILLS WITH OR WITHOUT FEVER  NAUSEA AND VOMITING THAT IS NOT CONTROLLED WITH YOUR NAUSEA MEDICATION  *UNUSUAL SHORTNESS OF BREATH  *UNUSUAL BRUISING OR BLEEDING  TENDERNESS IN MOUTH AND THROAT WITH OR WITHOUT PRESENCE OF ULCERS  *URINARY PROBLEMS  *BOWEL PROBLEMS  UNUSUAL RASH Items with * indicate a potential emergency and should be followed up as soon as possible.  Feel free to call the clinic should you have any questions or concerns. The clinic phone number is [phone].  Please show the CHEMO ALERT CARD at check-in to the Emergency Department and triage nurse.   

## 2020-04-16 ENCOUNTER — Encounter: Payer: Self-pay | Admitting: *Deleted

## 2020-04-16 ENCOUNTER — Encounter: Payer: Self-pay | Admitting: Licensed Clinical Social Worker

## 2020-04-16 ENCOUNTER — Encounter: Payer: Self-pay | Admitting: Oncology

## 2020-04-16 ENCOUNTER — Telehealth: Payer: Self-pay | Admitting: Adult Health

## 2020-04-16 NOTE — Progress Notes (Signed)
Received message to contact patient regarding financial assistance.  Went to hospital counselor(Ana) for assistance with reaching patient and explaining corresponding letter from Customer Service. [name] will contact patient to explain in [place].

## 2020-04-16 NOTE — Telephone Encounter (Signed)
No 8/4 los, no changes made to pt schedule  

## 2020-04-16 NOTE — Progress Notes (Signed)
[name]  Clinical Social Thomas was referred by [employer], Theatre manager, for assessment of psychosocial needs for patient who is uninsured.  Clinical Social Worker contacted patient by phone with telephonic interpreter [phone]  to offer support and assess for needs.    Patient stressed about bills for treatment and needs any assistance available. Filled out application for assistance from [facility], has not heard back yet. CSW sent message to Stefanie Libel to contact patient with any update. CSW offered to sign patient up for [employer] and start [place] application at next visit on 8/11. Also offered referral to [place] through [place] for [employer]. Patient agreed to all.  Follow-up: CSW to see pt in infusion 8/11     [name], [place], [place]

## 2020-04-22 ENCOUNTER — Inpatient Hospital Stay: Payer: No Typology Code available for payment source

## 2020-04-22 ENCOUNTER — Other Ambulatory Visit: Payer: Self-pay

## 2020-04-22 ENCOUNTER — Encounter: Payer: Self-pay | Admitting: Licensed Clinical Social Worker

## 2020-04-22 ENCOUNTER — Inpatient Hospital Stay: Payer: No Typology Code available for payment source | Admitting: Licensed Clinical Social Worker

## 2020-04-22 VITALS — BP 101/53 | HR 79 | Temp 98.6°F | Resp 17 | Ht 62.0 in | Wt 194.8 lb

## 2020-04-22 DIAGNOSIS — Z170 Estrogen receptor positive status [ER+]: Secondary | ICD-10-CM

## 2020-04-22 DIAGNOSIS — Z95828 Presence of other vascular implants and grafts: Secondary | ICD-10-CM

## 2020-04-22 LAB — COMPREHENSIVE METABOLIC PANEL
ALT: 19 U/L (ref 0–44)
AST: 12 U/L — ABNORMAL LOW (ref 15–41)
Albumin: 3.4 g/dL — ABNORMAL LOW (ref 3.5–5.0)
Alkaline Phosphatase: 81 U/L (ref 38–126)
Anion gap: 8 (ref 5–15)
BUN: 16 mg/dL (ref 6–20)
CO2: 26 mmol/L (ref 22–32)
Calcium: 9.4 mg/dL (ref 8.9–10.3)
Chloride: 104 mmol/L (ref 98–111)
Creatinine, Ser: 0.68 mg/dL (ref 0.44–1.00)
GFR calc Af Amer: >60 mL/min (ref >60)
GFR calc non Af Amer: >60 mL/min (ref >60)
Glucose, Bld: 98 mg/dL (ref 70–99)
Potassium: 4 mmol/L (ref 3.5–5.1)
Sodium: 138 mmol/L (ref 135–145)
Total Bilirubin: 0.2 mg/dL — ABNORMAL LOW (ref 0.3–1.2)
Total Protein: 6.1 g/dL — ABNORMAL LOW (ref 6.5–8.1)

## 2020-04-22 LAB — CBC WITH DIFFERENTIAL/PLATELET
Abs Immature Granulocytes: 0.12 10*3/uL — ABNORMAL HIGH (ref 0.00–0.07)
Basophils Absolute: 0 10*3/uL (ref 0.0–0.1)
Basophils Relative: 0 %
Eosinophils Absolute: 0 10*3/uL (ref 0.0–0.5)
Eosinophils Relative: 0 %
HCT: 29 % — ABNORMAL LOW (ref 36.0–46.0)
Hemoglobin: 9.5 g/dL — ABNORMAL LOW (ref 12.0–15.0)
Immature Granulocytes: 2 %
Lymphocytes Relative: 14 %
Lymphs Abs: 0.8 10*3/uL (ref 0.7–4.0)
MCH: 28.6 pg (ref 26.0–34.0)
MCHC: 32.8 g/dL (ref 30.0–36.0)
MCV: 87.3 fL (ref 80.0–100.0)
Monocytes Absolute: 0.4 10*3/uL (ref 0.1–1.0)
Monocytes Relative: 7 %
Neutro Abs: 4.5 10*3/uL (ref 1.7–7.7)
Neutrophils Relative %: 77 %
Platelets: 266 10*3/uL (ref 150–400)
RBC: 3.32 MIL/uL — ABNORMAL LOW (ref 3.87–5.11)
RDW: 17.2 % — ABNORMAL HIGH (ref 11.5–15.5)
WBC: 5.9 10*3/uL (ref 4.0–10.5)
nRBC: 0 % (ref 0.0–0.2)

## 2020-04-22 MED ORDER — SODIUM CHLORIDE 0.9% FLUSH
10.0000 mL | Freq: Once | INTRAVENOUS | Status: AC
  Administered 2020-04-22: 10 mL
  Filled 2020-04-22: qty 10

## 2020-04-22 MED ORDER — HEPARIN SOD (PORK) LOCK FLUSH 100 UNIT/ML IV SOLN
500.0000 [IU] | Freq: Once | INTRAVENOUS | Status: AC | PRN
  Administered 2020-04-22: 500 [IU]
  Filled 2020-04-22: qty 5

## 2020-04-22 MED ORDER — PALONOSETRON HCL INJECTION 0.25 MG/5ML
INTRAVENOUS | Status: AC
  Filled 2020-04-22: qty 5

## 2020-04-22 MED ORDER — SODIUM CHLORIDE 0.9 % IV SOLN
10.0000 mg | Freq: Once | INTRAVENOUS | Status: AC
  Administered 2020-04-22: 10 mg via INTRAVENOUS
  Filled 2020-04-22: qty 10

## 2020-04-22 MED ORDER — FAMOTIDINE IN NACL 20-0.9 MG/50ML-% IV SOLN
20.0000 mg | Freq: Once | INTRAVENOUS | Status: AC
  Administered 2020-04-22: 20 mg via INTRAVENOUS

## 2020-04-22 MED ORDER — SODIUM CHLORIDE 0.9 % IV SOLN
Freq: Once | INTRAVENOUS | Status: AC
  Filled 2020-04-22: qty 250

## 2020-04-22 MED ORDER — PALONOSETRON HCL INJECTION 0.25 MG/5ML
0.2500 mg | Freq: Once | INTRAVENOUS | Status: AC
  Administered 2020-04-22: 0.25 mg via INTRAVENOUS

## 2020-04-22 MED ORDER — SODIUM CHLORIDE 0.9% FLUSH
10.0000 mL | INTRAVENOUS | Status: DC | PRN
  Administered 2020-04-22: 10 mL
  Filled 2020-04-22: qty 10

## 2020-04-22 MED ORDER — DIPHENHYDRAMINE HCL 50 MG/ML IJ SOLN
INTRAMUSCULAR | Status: AC
  Filled 2020-04-22: qty 1

## 2020-04-22 MED ORDER — SODIUM CHLORIDE 0.9 % IV SOLN
80.0000 mg/m2 | Freq: Once | INTRAVENOUS | Status: AC
  Administered 2020-04-22: 156 mg via INTRAVENOUS
  Filled 2020-04-22: qty 26

## 2020-04-22 MED ORDER — DIPHENHYDRAMINE HCL 50 MG/ML IJ SOLN
25.0000 mg | Freq: Once | INTRAMUSCULAR | Status: AC
  Administered 2020-04-22: 25 mg via INTRAVENOUS

## 2020-04-22 MED ORDER — FAMOTIDINE IN NACL 20-0.9 MG/50ML-% IV SOLN
INTRAVENOUS | Status: AC
  Filled 2020-04-22: qty 50

## 2020-04-22 MED ORDER — SODIUM CHLORIDE 0.9 % IV SOLN
300.0000 mg | Freq: Once | INTRAVENOUS | Status: AC
  Administered 2020-04-22: 300 mg via INTRAVENOUS
  Filled 2020-04-22: qty 30

## 2020-04-22 NOTE — Progress Notes (Signed)
[place] CSW Progress Note  Holiday representative met with patient in infusion with interpreter Wyatt Portela, to follow up on resources. Patient spoke with Vicente Males re: [facility] financial assistance and is working on getting last form from work. Completed [employer] application today and CSW submitted. CSW provided bag from food pantry and first installment of [employer]. Will provide next installments at subsequent infusion appointments.    Edwinna Areola [place] [facility] LCSW

## 2020-04-22 NOTE — Patient Instructions (Signed)
[facility] Discharge Instructions for Patients Receiving Chemotherapy  Today you received the following chemotherapy agents: taxol, carboplatin   To help prevent nausea and vomiting after your treatment, we encourage you to take your nausea medication as directed.    If you develop nausea and vomiting that is not controlled by your nausea medication, call the clinic.   BELOW ARE SYMPTOMS THAT SHOULD BE REPORTED IMMEDIATELY:  *FEVER GREATER THAN 100.5 F  *CHILLS WITH OR WITHOUT FEVER  NAUSEA AND VOMITING THAT IS NOT CONTROLLED WITH YOUR NAUSEA MEDICATION  *UNUSUAL SHORTNESS OF BREATH  *UNUSUAL BRUISING OR BLEEDING  TENDERNESS IN MOUTH AND THROAT WITH OR WITHOUT PRESENCE OF ULCERS  *URINARY PROBLEMS  *BOWEL PROBLEMS  UNUSUAL RASH Items with * indicate a potential emergency and should be followed up as soon as possible.  Feel free to call the clinic should you have any questions or concerns. The clinic phone number is [phone].  Please show the CHEMO ALERT CARD at check-in to the Emergency Department and triage nurse.   

## 2020-04-28 NOTE — Progress Notes (Signed)
[facility]  [phone] [phone]     ID: [name] DOB: 03/22/1976  MR#: [id number]  NIO#:[id number]  Patient Care Team: Rutherford Guys, MD as PCP - General (Family Medicine) Rolm Bookbinder, MD as Consulting Physician (General Surgery) Magrinat, Virgie Dad, MD as Consulting Physician (Oncology) Eppie Gibson, MD as Attending Physician (Radiation Oncology) Rockwell Germany, RN as Oncology Nurse Navigator Mauro Kaufmann, RN as Oncology Nurse Navigator Scot Dock, NP OTHER MD:  CHIEF COMPLAINT: Triple negative breast cancer  CURRENT TREATMENT: Neoadjuvant chemotherapy   INTERVAL HISTORY:  [name] returns today for follow up and treatment of her triple negative breast cancer.  She is accompanied by [name], a [place] interpreter.  She is undergoing neoadjuvant chemotherapy with Epirubicin and Cyclophosphamide given every 2 weeks x 4 cycles, to be followed by weekly Paclitaxel and Carboplatin x 12 cycles.  She was changed from Doxorubicin to Epirubicin due to a rash.  She is here today for cycle 4 of weekly Paclitaxel and Carboplatin.  This will be given weekly x 12 weeks.  She is not having any peripheral neuropathy.  REVIEW OF SYSTEMS: [name] is doing well today.  She notes that she is having abdominal pain worse after eating.  She notes her abdomen gets hard and inflamed.  She notes that it is happening with all foods.  She is not having nausea.  She is having regular bowel bowel movements.  She does not feel constipated and is not having diarrhea.  She notes this pain is constant, and worse with eating.    She notes a weekly generalized aching around Sundays in her face and neck, that is not severe, and does resolve.  She is unsure what this could be related to.  She denies any fever, chills, chest pain, cough ,shortness of breath, headaches, vision issues, or any other concerns.  A detailed ROS was otherwise non contributory.      HISTORY OF CURRENT ILLNESS: From the original intake note:  [name] herself palpated a left beast lump with associated pain. She underwent bilateral diagnostic mammography with tomography and left breast ultrasonography at [facility] on 01/23/2020 showing: breast density category B; palpable 3.6 cm left breast mass with associated calcifications at 1 o'clock; 0.8 cm left breast mass at 2 o'clock, concerning for abnormal intramammary lymph node; at least two abnormal left axillary lymph nodes.  Accordingly on 01/23/2020 she proceeded to biopsy of the left breast area in question. The pathology from this procedure ([id number]) showed: invasive ductal carcinoma at 1 o'clock, grade 3. Prognostic indicators significant for: estrogen receptor, 20% positive with moderate staining intensity and progesterone receptor, 0% negative. Proliferation marker Ki67 at 40%. HER2 negative by immunohistochemistry (1+).  The left axillary lymph node confirmed metastatic carcinoma.  The left intramammary lymph node was negative for carcinoma.  The patient's subsequent history is as detailed below.   PAST MEDICAL HISTORY: Past Medical History:  Diagnosis Date  . Allergy   . Asthma   . Chronic back pain   . Hx of migraines   . Medical history non-contributory     PAST SURGICAL HISTORY: Past Surgical History:  Procedure Laterality Date  . CESAREAN SECTION WITH BILATERAL TUBAL LIGATION Bilateral 07/23/2015   Procedure: CESAREAN SECTION WITH BILATERAL TUBAL LIGATION;  Surgeon: Donnamae Jude, MD;  Location: [facility];  Service: Obstetrics;  Laterality: Bilateral;  . PORTACATH PLACEMENT Right 02/11/2020   Procedure: INSERTION PORT-A-CATH WITH ULTRASOUND GUIDANCE;  Surgeon: Donne Hazel,  Rodman Key, MD;  Location: [facility];  Service: General;  Laterality: Right;  . TUBAL LIGATION      FAMILY HISTORY: History reviewed. No pertinent family history.  Her parents are both living as of  01/2020, her father age 61 and her mother age 11. She has three maternal half-siblings (1 sister, 2 brothers) and four paternal half-siblings (1 sister, 3 brothers). There is no family history of cancer to her knowledge.   GYNECOLOGIC HISTORY:  Patient's last menstrual period was 01/20/2020 (exact date). Menarche: 44 years old Age at first live birth: 44 years old [place] P 3 LMP 01/2020 Contraceptive s/p tubal ligation HRT n/a  Hysterectomy? no BSO? 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 Husband [name] works for a Financial trader, both in [employer]. They are both from [place]. She lives at home with [name] and their three children-- [name], [name], and [name]. She attends an [employer].    ADVANCED DIRECTIVES: In the absence of any documentation to the contrary, the patient's spouse is their HCPOA.    HEALTH MAINTENANCE: Social History   Tobacco Use  . Smoking status: Never Smoker  . Smokeless tobacco: Never Used  Vaping Use  . Vaping Use: Never used  Substance Use Topics  . Alcohol use: No  . Drug use: No     Colonoscopy: n/a (age)  PAP: 11/2017, negative (per patient)  Bone density: n/a (age)   Allergies  Allergen Reactions  . Doxorubicin Hcl Rash    Experience chest tightness, abdominal pain, and redness around lips during and shortly after doxorubicin bolus.     Current Outpatient Medications  Medication Sig Dispense Refill  . albuterol (VENTOLIN HFA) 108 (90 Base) MCG/ACT inhaler Inhale into the lungs every 6 (six) hours as needed for wheezing or shortness of breath.    Marland Kitchen azelastine (ASTELIN) 0.1 % nasal spray Place 2 sprays into both nostrils 2 (two) times daily. Use in each nostril as directed 30 mL 12  . dexamethasone (DECADRON) 4 MG tablet Take 1 tablets (8 mg) by mouth daily with breakfast starting the day after chemotherapy x 2 days 30 tablet 1  .  fluticasone (FLONASE) 50 MCG/ACT nasal spray Place 2 sprays into both nostrils daily. 16 g 2  . lidocaine-prilocaine (EMLA) cream Apply to affected area once 30 g 3  . LORazepam (ATIVAN) 0.5 MG tablet Take 1 tablet at bedtime on days 1 ,2 and 3 of each chemotherapy treatment, then at bedtime as needed 30 tablet 0  . magic mouthwash SOLN Take 5 mLs by mouth 3 (three) times daily as needed for mouth pain. 240 mL 0  . pantoprazole (PROTONIX) 40 MG tablet Take 1 tablet (40 mg total) by mouth 2 (two) times daily. 60 tablet 1  . sodium chloride (OCEAN) 0.65 % SOLN nasal spray Place 2 sprays into both nostrils as needed for congestion. 30 mL 0  . valACYclovir (VALTREX) 500 MG tablet Take 1 tablet (500 mg total) by mouth 2 (two) times daily. 60 tablet 1  . prochlorperazine (COMPAZINE) 10 MG tablet Take 1 tablet (10 mg total) by mouth 3 (three) times daily before meals for 3 days. 30 tablet 1   No current facility-administered medications for this visit.    OBJECTIVE:   Vitals:   04/29/20 1000  BP: 111/70  Pulse: 86  Resp: 18  Temp: 98.4 F (36.9 C)  SpO2: 100%  Body mass index is 35.74 kg/m.   Wt Readings from Last 3 Encounters:  04/29/20 195 lb 6.4 oz (88.6 kg)  04/22/20 194 lb 12.8 oz (88.4 kg)  04/15/20 190 lb 14.4 oz (86.6 kg)      ECOG FS:1 - Symptomatic but completely ambulatory GENERAL: Patient is a well appearing female in no acute distress HEENT:  Sclerae anicteric. NO oropharyngeal ulceration or candida noted Neck is supple.  NODES:  No cervical, supraclavicular, or axillary lymphadenopathy palpated.  BREAST EXAM:  Difficulty palpating left breast mass, no progression noted LUNGS:  Clear to auscultation bilaterally.  No wheezes or rhonchi. HEART:  Regular rate and rhythm. No murmur appreciated. ABDOMEN:  Soft, nontender.  Positive, normoactive bowel sounds. No organomegaly palpated. MSK:  No focal spinal tenderness to palpation. Full range of motion bilaterally in the upper  extremities. EXTREMITIES:  No peripheral edema.   SKIN:  Clear with no obvious rashes or skin changes. No nail dyscrasia. NEURO:  Nonfocal. Well oriented.  Appropriate affect.     LAB RESULTS:  CMP     Component Value Date/Time   NA 139 04/29/2020 0950   NA 138 08/15/2017 1525   K 3.6 04/29/2020 0950   CL 104 04/29/2020 0950   CO2 27 04/29/2020 0950   GLUCOSE 119 (H) 04/29/2020 0950   BUN 13 04/29/2020 0950   BUN 12 08/15/2017 1525   CREATININE 0.69 04/29/2020 0950   CREATININE 0.77 02/26/2020 0915   CALCIUM 9.3 04/29/2020 0950   PROT 6.2 (L) 04/29/2020 0950   PROT 7.4 01/03/2017 1642   ALBUMIN 3.4 (L) 04/29/2020 0950   ALBUMIN 4.3 01/03/2017 1642   AST 11 (L) 04/29/2020 0950   AST 17 02/26/2020 0915   ALT 19 04/29/2020 0950   ALT 21 02/26/2020 0915   ALKPHOS 94 04/29/2020 0950   BILITOT 0.2 (L) 04/29/2020 0950   BILITOT <0.2 (L) 02/26/2020 0915   GFRNONAA >60 04/29/2020 0950   GFRNONAA >60 02/26/2020 0915   GFRAA >60 04/29/2020 0950   GFRAA >60 02/26/2020 0915    No results found for: TOTALPROTELP, ALBUMINELP, A1GS, A2GS, BETS, BETA2SER, GAMS, MSPIKE, SPEI  Lab Results  Component Value Date   WBC 6.7 04/29/2020   NEUTROABS 5.4 04/29/2020   HGB 9.9 (L) 04/29/2020   HCT 30.4 (L) 04/29/2020   MCV 89.7 04/29/2020   PLT 208 04/29/2020    No results found for: LABCA2  No components found for: [id number]  No results for input(s): INR in the last 168 hours.  No results found for: LABCA2  No results found for: FIE332  No results found for: RJJ884  No results found for: ZYS063  No results found for: CA2729  No components found for: HGQUANT  No results found for: CEA1 / No results found for: CEA1   No results found for: AFPTUMOR  No results found for: CHROMOGRNA  No results found for: KPAFRELGTCHN, LAMBDASER, KAPLAMBRATIO (kappa/lambda light chains)  No results found for: HGBA, HGBA2QUANT, HGBFQUANT, HGBSQUAN (Hemoglobinopathy evaluation)   No  results found for: LDH  No results found for: IRON, TIBC, IRONPCTSAT (Iron and TIBC)  No results found for: FERRITIN  Urinalysis No results found for: COLORURINE, APPEARANCEUR, LABSPEC, PHURINE, GLUCOSEU, HGBUR, BILIRUBINUR, KETONESUR, PROTEINUR, UROBILINOGEN, NITRITE, LEUKOCYTESUR   STUDIES: No results found.   ELIGIBLE FOR AVAILABLE RESEARCH PROTOCOL: [phone]?  ASSESSMENT: 44 y.o. [place] woman status post left breast upper outer quadrant biopsy 01/23/2020 for a clinical T3 N1, stage IIIC functionally triple negative invasive ductal carcinoma, grade  3, with an MIB-1 of 40%.  (a) chest CT scan and bone scan 02/07/2020 showed no evidence of metastatic disease  (1) neoadjuvant chemotherapy will consist of doxorubicin and cyclophosphamide in dose dense fashion x4 starting 02/12/2020 to be followed by paclitaxel and carboplatin weekly x12  (a) echo 02/07/2020 shows an ejection fraction in the 55-60% range  (b) Epirubicin substituted for Doxorubicin secondary to allergic rash from Doxorubicin  (2) definitive surgery to follow  (3) adjuvant radiation  (4) genetics testing  (a) Negative genetic testing. No pathogenic variants identified on the [facility] Common Hereditary Cancers Panel. VUS in MSH6 called c.2744C>G identified. The report date is 02/07/2020.  The Common Hereditary Cancers Panel offered by [facility] includes sequencing and/or deletion duplication testing of the following 48 genes: APC, ATM, AXIN2, BARD1, BMPR1A, BRCA1, BRCA2, BRIP1, CDH1, CDKN2A (p14ARF), CDKN2A (p16INK4a), CKD4, CHEK2, CTNNA1, DICER1, EPCAM (Deletion/duplication testing only), GREM1 (promoter region deletion/duplication testing only), KIT, MEN1, MLH1, MSH2, MSH3, MSH6, MUTYH, NBN, NF1, NHTL1, PALB2, PDGFRA, PMS2, POLD1, POLE, PTEN, RAD50, RAD51C, RAD51D, RNF43, SDHB, SDHC, SDHD, SMAD4, SMARCA4. STK11, TP53, TSC1, TSC2, and VHL.  The following genes were evaluated for sequence changes only: SDHA and HOXB13  c.251G>A variant only.   PLAN: [name] is here for f/u and treatment of her stage IIIC triple negative invasive breast cancer.  She is undergoing neoadjuvant treatment for her cancer, and has completed 4 cycles of doxorubicin/cyclophosphamide, and is here for evaluation and clearance prior to receiving week 4 of her weekly Paclitaxel and Carboplatin.  She will proceed with therapy today, she is not experiencing any peripheral neuropathy, and her labs are within parameters (CMET still pending).  I cannot palpate a left breast mass which is indicative of continued clinical response to treatment.    [name] and I discussed her abdominal discomfort.  This is likely gastric related.  She will undergo plain films of her abdomen today.  I have dose reduced her dexamethasone to be one tablet daily x 2 days starting the day after chemotherapy, to be taken with breakfast.  I increased her Protonix.  Should her pain persist, I will order CT abdomen/pelvis.  I do not suspect that we are dealing with an acute abdomen.    She will return weekly for labs, and weekly chemotherapy, and will see Dr. Jana Hakim in 2 weeks for f/u.  She knows to call for any questions that may arise between now and her next appointment.  We are happy to see her sooner if needed.   Total encounter time 30 minutes*  Wilber Bihari, NP 04/29/20 10:29 AM Medical Oncology and Hematology [facility] [place], [place] [address] Tel. [phone]    Fax. [phone]  *Total Encounter Time as defined by the Centers for Medicare and Medicaid Services includes, in addition to the face-to-face time of a patient visit (documented in the note above) non-face-to-face time: obtaining and reviewing outside history, ordering and reviewing medications, tests or procedures, care coordination (communications with other health care professionals or caregivers) and documentation in the medical record.

## 2020-04-29 ENCOUNTER — Other Ambulatory Visit: Payer: Self-pay

## 2020-04-29 ENCOUNTER — Inpatient Hospital Stay (HOSPITAL_BASED_OUTPATIENT_CLINIC_OR_DEPARTMENT_OTHER): Payer: Self-pay | Admitting: Adult Health

## 2020-04-29 ENCOUNTER — Inpatient Hospital Stay: Payer: Self-pay

## 2020-04-29 ENCOUNTER — Encounter: Payer: Self-pay | Admitting: Adult Health

## 2020-04-29 ENCOUNTER — Ambulatory Visit (HOSPITAL_COMMUNITY)
Admission: RE | Admit: 2020-04-29 | Discharge: 2020-04-29 | Disposition: A | Discharge status: Home / Self Care | Payer: No Typology Code available for payment source | Source: Ambulatory Visit | Attending: Adult Health | Admitting: Adult Health

## 2020-04-29 VITALS — BP 111/70 | HR 86 | Temp 98.4°F | Resp 18 | Ht 62.0 in | Wt 195.4 lb

## 2020-04-29 DIAGNOSIS — C50412 Malignant neoplasm of upper-outer quadrant of left female breast: Secondary | ICD-10-CM

## 2020-04-29 DIAGNOSIS — Z170 Estrogen receptor positive status [ER+]: Secondary | ICD-10-CM

## 2020-04-29 DIAGNOSIS — Z95828 Presence of other vascular implants and grafts: Secondary | ICD-10-CM

## 2020-04-29 LAB — CBC WITH DIFFERENTIAL/PLATELET
Abs Immature Granulocytes: 0.12 10*3/uL — ABNORMAL HIGH (ref 0.00–0.07)
Basophils Absolute: 0 10*3/uL (ref 0.0–0.1)
Basophils Relative: 0 %
Eosinophils Absolute: 0 10*3/uL (ref 0.0–0.5)
Eosinophils Relative: 0 %
HCT: 30.4 % — ABNORMAL LOW (ref 36.0–46.0)
Hemoglobin: 9.9 g/dL — ABNORMAL LOW (ref 12.0–15.0)
Immature Granulocytes: 2 %
Lymphocytes Relative: 14 %
Lymphs Abs: 0.9 10*3/uL (ref 0.7–4.0)
MCH: 29.2 pg (ref 26.0–34.0)
MCHC: 32.6 g/dL (ref 30.0–36.0)
MCV: 89.7 fL (ref 80.0–100.0)
Monocytes Absolute: 0.3 10*3/uL (ref 0.1–1.0)
Monocytes Relative: 4 %
Neutro Abs: 5.4 10*3/uL (ref 1.7–7.7)
Neutrophils Relative %: 80 %
Platelets: 208 10*3/uL (ref 150–400)
RBC: 3.39 MIL/uL — ABNORMAL LOW (ref 3.87–5.11)
RDW: 17.5 % — ABNORMAL HIGH (ref 11.5–15.5)
WBC: 6.7 10*3/uL (ref 4.0–10.5)
nRBC: 0 % (ref 0.0–0.2)

## 2020-04-29 LAB — COMPREHENSIVE METABOLIC PANEL WITH GFR
ALT: 19 U/L (ref 0–44)
AST: 11 U/L — ABNORMAL LOW (ref 15–41)
Albumin: 3.4 g/dL — ABNORMAL LOW (ref 3.5–5.0)
Alkaline Phosphatase: 94 U/L (ref 38–126)
Anion gap: 8 (ref 5–15)
BUN: 13 mg/dL (ref 6–20)
CO2: 27 mmol/L (ref 22–32)
Calcium: 9.3 mg/dL (ref 8.9–10.3)
Chloride: 104 mmol/L (ref 98–111)
Creatinine, Ser: 0.69 mg/dL (ref 0.44–1.00)
GFR calc Af Amer: >60 mL/min
GFR calc non Af Amer: >60 mL/min
Glucose, Bld: 119 mg/dL — ABNORMAL HIGH (ref 70–99)
Potassium: 3.6 mmol/L (ref 3.5–5.1)
Sodium: 139 mmol/L (ref 135–145)
Total Bilirubin: 0.2 mg/dL — ABNORMAL LOW (ref 0.3–1.2)
Total Protein: 6.2 g/dL — ABNORMAL LOW (ref 6.5–8.1)

## 2020-04-29 MED ORDER — SODIUM CHLORIDE 0.9 % IV SOLN
80.0000 mg/m2 | Freq: Once | INTRAVENOUS | Status: AC
  Administered 2020-04-29: 156 mg via INTRAVENOUS
  Filled 2020-04-29: qty 26

## 2020-04-29 MED ORDER — DEXAMETHASONE SODIUM PHOSPHATE 10 MG/ML IJ SOLN
4.0000 mg | Freq: Once | INTRAMUSCULAR | Status: AC
  Administered 2020-04-29: 4 mg via INTRAVENOUS

## 2020-04-29 MED ORDER — FAMOTIDINE IN NACL 20-0.9 MG/50ML-% IV SOLN
INTRAVENOUS | Status: AC
  Filled 2020-04-29: qty 50

## 2020-04-29 MED ORDER — SODIUM CHLORIDE 0.9 % IV SOLN
Freq: Once | INTRAVENOUS | Status: AC
  Filled 2020-04-29: qty 250

## 2020-04-29 MED ORDER — DEXAMETHASONE SODIUM PHOSPHATE 10 MG/ML IJ SOLN
INTRAMUSCULAR | Status: AC
  Filled 2020-04-29: qty 1

## 2020-04-29 MED ORDER — HEPARIN SOD (PORK) LOCK FLUSH 100 UNIT/ML IV SOLN
500.0000 [IU] | Freq: Once | INTRAVENOUS | Status: AC | PRN
  Administered 2020-04-29: 500 [IU]
  Filled 2020-04-29: qty 5

## 2020-04-29 MED ORDER — DIPHENHYDRAMINE HCL 50 MG/ML IJ SOLN
25.0000 mg | Freq: Once | INTRAMUSCULAR | Status: AC
  Administered 2020-04-29: 25 mg via INTRAVENOUS

## 2020-04-29 MED ORDER — SODIUM CHLORIDE 0.9 % IV SOLN: 4.0000 mg | Freq: Once | INTRAVENOUS | Status: DC

## 2020-04-29 MED ORDER — SODIUM CHLORIDE 0.9% FLUSH
10.0000 mL | INTRAVENOUS | Status: DC | PRN
  Administered 2020-04-29: 10 mL
  Filled 2020-04-29: qty 10

## 2020-04-29 MED ORDER — FAMOTIDINE IN NACL 20-0.9 MG/50ML-% IV SOLN
20.0000 mg | Freq: Once | INTRAVENOUS | Status: AC
  Administered 2020-04-29: 20 mg via INTRAVENOUS

## 2020-04-29 MED ORDER — DIPHENHYDRAMINE HCL 50 MG/ML IJ SOLN
INTRAMUSCULAR | Status: AC
  Filled 2020-04-29: qty 1

## 2020-04-29 MED ORDER — PALONOSETRON HCL INJECTION 0.25 MG/5ML
INTRAVENOUS | Status: AC
  Filled 2020-04-29: qty 5

## 2020-04-29 MED ORDER — PANTOPRAZOLE SODIUM 40 MG PO TBEC: 40.0000 mg | DELAYED_RELEASE_TABLET | Freq: Two times a day (BID) | ORAL | 1 refills | Status: DC

## 2020-04-29 MED ORDER — PALONOSETRON HCL INJECTION 0.25 MG/5ML
0.2500 mg | Freq: Once | INTRAVENOUS | Status: AC
  Administered 2020-04-29: 0.25 mg via INTRAVENOUS

## 2020-04-29 MED ORDER — SODIUM CHLORIDE 0.9 % IV SOLN
300.0000 mg | Freq: Once | INTRAVENOUS | Status: AC
  Administered 2020-04-29: 300 mg via INTRAVENOUS
  Filled 2020-04-29: qty 30

## 2020-04-29 MED ORDER — DEXAMETHASONE 4 MG PO TABS: ORAL_TABLET | ORAL | 1 refills | Status: DC

## 2020-04-29 MED ORDER — SODIUM CHLORIDE 0.9% FLUSH
10.0000 mL | Freq: Once | INTRAVENOUS | Status: AC
  Administered 2020-04-29: 10 mL
  Filled 2020-04-29: qty 10

## 2020-04-29 NOTE — Patient Instructions (Signed)
[facility] Discharge Instructions for Patients Receiving Chemotherapy  Today you received the following chemotherapy agents: taxol, carboplatin   To help prevent nausea and vomiting after your treatment, we encourage you to take your nausea medication as directed.    If you develop nausea and vomiting that is not controlled by your nausea medication, call the clinic.   BELOW ARE SYMPTOMS THAT SHOULD BE REPORTED IMMEDIATELY:  *FEVER GREATER THAN 100.5 F  *CHILLS WITH OR WITHOUT FEVER  NAUSEA AND VOMITING THAT IS NOT CONTROLLED WITH YOUR NAUSEA MEDICATION  *UNUSUAL SHORTNESS OF BREATH  *UNUSUAL BRUISING OR BLEEDING  TENDERNESS IN MOUTH AND THROAT WITH OR WITHOUT PRESENCE OF ULCERS  *URINARY PROBLEMS  *BOWEL PROBLEMS  UNUSUAL RASH Items with * indicate a potential emergency and should be followed up as soon as possible.  Feel free to call the clinic should you have any questions or concerns. The clinic phone number is [phone].  Please show the CHEMO ALERT CARD at check-in to the Emergency Department and triage nurse.   

## 2020-04-29 NOTE — Patient Instructions (Signed)

## 2020-05-06 ENCOUNTER — Inpatient Hospital Stay: Payer: Self-pay

## 2020-05-06 ENCOUNTER — Telehealth: Payer: Self-pay

## 2020-05-06 ENCOUNTER — Other Ambulatory Visit: Payer: Self-pay

## 2020-05-06 VITALS — BP 100/67 | HR 86 | Temp 98.6°F | Resp 18

## 2020-05-06 DIAGNOSIS — Z170 Estrogen receptor positive status [ER+]: Secondary | ICD-10-CM

## 2020-05-06 LAB — CBC WITH DIFFERENTIAL/PLATELET
Abs Immature Granulocytes: 0.09 10*3/uL — ABNORMAL HIGH (ref 0.00–0.07)
Basophils Absolute: 0 10*3/uL (ref 0.0–0.1)
Basophils Relative: 0 %
Eosinophils Absolute: 0 10*3/uL (ref 0.0–0.5)
Eosinophils Relative: 0 %
HCT: 30.3 % — ABNORMAL LOW (ref 36.0–46.0)
Hemoglobin: 9.7 g/dL — ABNORMAL LOW (ref 12.0–15.0)
Immature Granulocytes: 2 %
Lymphocytes Relative: 16 %
Lymphs Abs: 0.9 10*3/uL (ref 0.7–4.0)
MCH: 28.4 pg (ref 26.0–34.0)
MCHC: 32 g/dL (ref 30.0–36.0)
MCV: 88.9 fL (ref 80.0–100.0)
Monocytes Absolute: 0.2 10*3/uL (ref 0.1–1.0)
Monocytes Relative: 3 %
Neutro Abs: 4.5 10*3/uL (ref 1.7–7.7)
Neutrophils Relative %: 79 %
Platelets: 162 10*3/uL (ref 150–400)
RBC: 3.41 MIL/uL — ABNORMAL LOW (ref 3.87–5.11)
RDW: 17.7 % — ABNORMAL HIGH (ref 11.5–15.5)
WBC: 5.7 10*3/uL (ref 4.0–10.5)
nRBC: 0 % (ref 0.0–0.2)

## 2020-05-06 LAB — COMPREHENSIVE METABOLIC PANEL
ALT: 20 U/L (ref 0–44)
AST: 14 U/L — ABNORMAL LOW (ref 15–41)
Albumin: 3.5 g/dL (ref 3.5–5.0)
Alkaline Phosphatase: 77 U/L (ref 38–126)
Anion gap: 5 (ref 5–15)
BUN: 12 mg/dL (ref 6–20)
CO2: 31 mmol/L (ref 22–32)
Calcium: 9.7 mg/dL (ref 8.9–10.3)
Chloride: 102 mmol/L (ref 98–111)
Creatinine, Ser: 0.67 mg/dL (ref 0.44–1.00)
GFR calc Af Amer: >60 mL/min (ref >60)
GFR calc non Af Amer: >60 mL/min (ref >60)
Glucose, Bld: 111 mg/dL — ABNORMAL HIGH (ref 70–99)
Potassium: 3.8 mmol/L (ref 3.5–5.1)
Sodium: 138 mmol/L (ref 135–145)
Total Bilirubin: 0.3 mg/dL (ref 0.3–1.2)
Total Protein: 6.2 g/dL — ABNORMAL LOW (ref 6.5–8.1)

## 2020-05-06 MED ORDER — SODIUM CHLORIDE 0.9 % IV SOLN
Freq: Once | INTRAVENOUS | Status: AC
  Filled 2020-05-06: qty 250

## 2020-05-06 MED ORDER — PALONOSETRON HCL INJECTION 0.25 MG/5ML
INTRAVENOUS | Status: AC
  Filled 2020-05-06: qty 5

## 2020-05-06 MED ORDER — FAMOTIDINE IN NACL 20-0.9 MG/50ML-% IV SOLN
20.0000 mg | Freq: Once | INTRAVENOUS | Status: AC
  Administered 2020-05-06: 20 mg via INTRAVENOUS

## 2020-05-06 MED ORDER — SODIUM CHLORIDE 0.9 % IV SOLN
300.0000 mg | Freq: Once | INTRAVENOUS | Status: AC
  Administered 2020-05-06: 300 mg via INTRAVENOUS
  Filled 2020-05-06: qty 30

## 2020-05-06 MED ORDER — DIPHENHYDRAMINE HCL 50 MG/ML IJ SOLN
25.0000 mg | Freq: Once | INTRAMUSCULAR | Status: AC
  Administered 2020-05-06: 25 mg via INTRAVENOUS

## 2020-05-06 MED ORDER — PALONOSETRON HCL INJECTION 0.25 MG/5ML
0.2500 mg | Freq: Once | INTRAVENOUS | Status: AC
  Administered 2020-05-06: 0.25 mg via INTRAVENOUS

## 2020-05-06 MED ORDER — DIPHENHYDRAMINE HCL 50 MG/ML IJ SOLN
INTRAMUSCULAR | Status: AC
  Filled 2020-05-06: qty 1

## 2020-05-06 MED ORDER — SODIUM CHLORIDE 0.9 % IV SOLN
80.0000 mg/m2 | Freq: Once | INTRAVENOUS | Status: AC
  Administered 2020-05-06: 156 mg via INTRAVENOUS
  Filled 2020-05-06: qty 26

## 2020-05-06 MED ORDER — SODIUM CHLORIDE 0.9 % IV SOLN: 4.0000 mg | Freq: Once | INTRAVENOUS | Status: DC

## 2020-05-06 MED ORDER — DEXAMETHASONE SODIUM PHOSPHATE 10 MG/ML IJ SOLN
4.0000 mg | Freq: Once | INTRAMUSCULAR | Status: AC
  Administered 2020-05-06: 4 mg via INTRAVENOUS

## 2020-05-06 MED ORDER — HEPARIN SOD (PORK) LOCK FLUSH 100 UNIT/ML IV SOLN
500.0000 [IU] | Freq: Once | INTRAVENOUS | Status: AC | PRN
  Administered 2020-05-06: 500 [IU]
  Filled 2020-05-06: qty 5

## 2020-05-06 MED ORDER — DEXAMETHASONE SODIUM PHOSPHATE 10 MG/ML IJ SOLN
INTRAMUSCULAR | Status: AC
  Filled 2020-05-06: qty 1

## 2020-05-06 MED ORDER — SODIUM CHLORIDE 0.9% FLUSH
10.0000 mL | INTRAVENOUS | Status: DC | PRN
  Administered 2020-05-06: 10 mL
  Filled 2020-05-06: qty 10

## 2020-05-06 MED ORDER — FAMOTIDINE IN NACL 20-0.9 MG/50ML-% IV SOLN
INTRAVENOUS | Status: AC
  Filled 2020-05-06: qty 50

## 2020-05-06 NOTE — Telephone Encounter (Signed)
-----   Message from Gardenia Phlegm, NP sent at 04/29/2020  9:16 PM EDT ----- Odette Horns shows constipation.  Recommend patient get otc miralax and take dailyx 3 days.   ----- Message ----- From: Interface, Rad Results In Sent: 04/29/2020   2:52 PM EDT To: Gardenia Phlegm, NP

## 2020-05-06 NOTE — Patient Instructions (Signed)
   [facility] Discharge Instructions for Patients Receiving Chemotherapy  Today you received the following chemotherapy agents Taxol and Carboplatin   To help prevent nausea and vomiting after your treatment, we encourage you to take your nausea medication as directed.    If you develop nausea and vomiting that is not controlled by your nausea medication, call the clinic.   BELOW ARE SYMPTOMS THAT SHOULD BE REPORTED IMMEDIATELY:  *FEVER GREATER THAN 100.5 F  *CHILLS WITH OR WITHOUT FEVER  NAUSEA AND VOMITING THAT IS NOT CONTROLLED WITH YOUR NAUSEA MEDICATION  *UNUSUAL SHORTNESS OF BREATH  *UNUSUAL BRUISING OR BLEEDING  TENDERNESS IN MOUTH AND THROAT WITH OR WITHOUT PRESENCE OF ULCERS  *URINARY PROBLEMS  *BOWEL PROBLEMS  UNUSUAL RASH Items with * indicate a potential emergency and should be followed up as soon as possible.  Feel free to call the clinic should you have any questions or concerns. The clinic phone number is [phone].  Please show the CHEMO ALERT CARD at check-in to the Emergency Department and triage nurse.   

## 2020-05-06 NOTE — Telephone Encounter (Signed)
Spoke with pt via interpreter line to provide below information spoke with interpreter [phone], who relayed information to pt. Pt verbalized agreement and thanks and understands to call back if Miralax does not help.

## 2020-05-13 ENCOUNTER — Inpatient Hospital Stay: Payer: Self-pay

## 2020-05-13 ENCOUNTER — Encounter: Payer: Self-pay | Admitting: *Deleted

## 2020-05-13 ENCOUNTER — Other Ambulatory Visit: Payer: Self-pay

## 2020-05-13 ENCOUNTER — Inpatient Hospital Stay (HOSPITAL_BASED_OUTPATIENT_CLINIC_OR_DEPARTMENT_OTHER): Payer: Self-pay | Admitting: Oncology

## 2020-05-13 ENCOUNTER — Inpatient Hospital Stay: Payer: Self-pay | Attending: Oncology

## 2020-05-13 VITALS — BP 117/63 | HR 91 | Temp 97.9°F | Resp 18 | Ht 62.0 in | Wt 195.7 lb

## 2020-05-13 DIAGNOSIS — R109 Unspecified abdominal pain: Secondary | ICD-10-CM | POA: Insufficient documentation

## 2020-05-13 DIAGNOSIS — C50412 Malignant neoplasm of upper-outer quadrant of left female breast: Secondary | ICD-10-CM

## 2020-05-13 DIAGNOSIS — Z5111 Encounter for antineoplastic chemotherapy: Secondary | ICD-10-CM | POA: Insufficient documentation

## 2020-05-13 DIAGNOSIS — K5900 Constipation, unspecified: Secondary | ICD-10-CM | POA: Insufficient documentation

## 2020-05-13 DIAGNOSIS — Z452 Encounter for adjustment and management of vascular access device: Secondary | ICD-10-CM | POA: Insufficient documentation

## 2020-05-13 DIAGNOSIS — Z170 Estrogen receptor positive status [ER+]: Secondary | ICD-10-CM

## 2020-05-13 DIAGNOSIS — R200 Anesthesia of skin: Secondary | ICD-10-CM | POA: Insufficient documentation

## 2020-05-13 DIAGNOSIS — J45909 Unspecified asthma, uncomplicated: Secondary | ICD-10-CM | POA: Insufficient documentation

## 2020-05-13 DIAGNOSIS — Z79899 Other long term (current) drug therapy: Secondary | ICD-10-CM | POA: Insufficient documentation

## 2020-05-13 DIAGNOSIS — H01009 Unspecified blepharitis unspecified eye, unspecified eyelid: Secondary | ICD-10-CM | POA: Insufficient documentation

## 2020-05-13 DIAGNOSIS — G629 Polyneuropathy, unspecified: Secondary | ICD-10-CM | POA: Insufficient documentation

## 2020-05-13 DIAGNOSIS — M549 Dorsalgia, unspecified: Secondary | ICD-10-CM | POA: Insufficient documentation

## 2020-05-13 DIAGNOSIS — C773 Secondary and unspecified malignant neoplasm of axilla and upper limb lymph nodes: Secondary | ICD-10-CM | POA: Insufficient documentation

## 2020-05-13 DIAGNOSIS — Z95828 Presence of other vascular implants and grafts: Secondary | ICD-10-CM

## 2020-05-13 LAB — CBC WITH DIFFERENTIAL/PLATELET
Abs Immature Granulocytes: 0.05 10*3/uL (ref 0.00–0.07)
Basophils Absolute: 0 10*3/uL (ref 0.0–0.1)
Basophils Relative: 0 %
Eosinophils Absolute: 0 10*3/uL (ref 0.0–0.5)
Eosinophils Relative: 1 %
HCT: 29.2 % — ABNORMAL LOW (ref 36.0–46.0)
Hemoglobin: 9.5 g/dL — ABNORMAL LOW (ref 12.0–15.0)
Immature Granulocytes: 1 %
Lymphocytes Relative: 28 %
Lymphs Abs: 1.1 10*3/uL (ref 0.7–4.0)
MCH: 29.7 pg (ref 26.0–34.0)
MCHC: 32.5 g/dL (ref 30.0–36.0)
MCV: 91.3 fL (ref 80.0–100.0)
Monocytes Absolute: 0.2 10*3/uL (ref 0.1–1.0)
Monocytes Relative: 5 %
Neutro Abs: 2.6 10*3/uL (ref 1.7–7.7)
Neutrophils Relative %: 65 %
Platelets: 218 10*3/uL (ref 150–400)
RBC: 3.2 MIL/uL — ABNORMAL LOW (ref 3.87–5.11)
RDW: 17.7 % — ABNORMAL HIGH (ref 11.5–15.5)
WBC: 4 10*3/uL (ref 4.0–10.5)
nRBC: 0 % (ref 0.0–0.2)

## 2020-05-13 LAB — COMPREHENSIVE METABOLIC PANEL
ALT: 23 U/L (ref 0–44)
AST: 17 U/L (ref 15–41)
Albumin: 3.5 g/dL (ref 3.5–5.0)
Alkaline Phosphatase: 92 U/L (ref 38–126)
Anion gap: 7 (ref 5–15)
BUN: 13 mg/dL (ref 6–20)
CO2: 25 mmol/L (ref 22–32)
Calcium: 9.8 mg/dL (ref 8.9–10.3)
Chloride: 108 mmol/L (ref 98–111)
Creatinine, Ser: 0.67 mg/dL (ref 0.44–1.00)
GFR calc Af Amer: >60 mL/min (ref >60)
GFR calc non Af Amer: >60 mL/min (ref >60)
Glucose, Bld: 107 mg/dL — ABNORMAL HIGH (ref 70–99)
Potassium: 3.6 mmol/L (ref 3.5–5.1)
Sodium: 140 mmol/L (ref 135–145)
Total Bilirubin: <0.2 mg/dL — ABNORMAL LOW (ref 0.3–1.2)
Total Protein: 6.4 g/dL — ABNORMAL LOW (ref 6.5–8.1)

## 2020-05-13 MED ORDER — SODIUM CHLORIDE 0.9% FLUSH
10.0000 mL | INTRAVENOUS | Status: DC | PRN
  Administered 2020-05-13: 10 mL
  Filled 2020-05-13: qty 10

## 2020-05-13 MED ORDER — DIPHENHYDRAMINE HCL 50 MG/ML IJ SOLN
INTRAMUSCULAR | Status: AC
  Filled 2020-05-13: qty 1

## 2020-05-13 MED ORDER — PALONOSETRON HCL INJECTION 0.25 MG/5ML
INTRAVENOUS | Status: AC
  Filled 2020-05-13: qty 5

## 2020-05-13 MED ORDER — SODIUM CHLORIDE 0.9 % IV SOLN
80.0000 mg/m2 | Freq: Once | INTRAVENOUS | Status: AC
  Administered 2020-05-13: 156 mg via INTRAVENOUS
  Filled 2020-05-13: qty 26

## 2020-05-13 MED ORDER — FAMOTIDINE IN NACL 20-0.9 MG/50ML-% IV SOLN
INTRAVENOUS | Status: AC
  Filled 2020-05-13: qty 50

## 2020-05-13 MED ORDER — PALONOSETRON HCL INJECTION 0.25 MG/5ML
0.2500 mg | Freq: Once | INTRAVENOUS | Status: AC
  Administered 2020-05-13: 0.25 mg via INTRAVENOUS

## 2020-05-13 MED ORDER — SODIUM CHLORIDE 0.9 % IV SOLN
300.0000 mg | Freq: Once | INTRAVENOUS | Status: AC
  Administered 2020-05-13: 300 mg via INTRAVENOUS
  Filled 2020-05-13: qty 30

## 2020-05-13 MED ORDER — DIPHENHYDRAMINE HCL 50 MG/ML IJ SOLN
25.0000 mg | Freq: Once | INTRAMUSCULAR | Status: AC
  Administered 2020-05-13: 25 mg via INTRAVENOUS

## 2020-05-13 MED ORDER — SODIUM CHLORIDE 0.9% FLUSH
10.0000 mL | Freq: Once | INTRAVENOUS | Status: AC
  Administered 2020-05-13: 10 mL
  Filled 2020-05-13: qty 10

## 2020-05-13 MED ORDER — DEXAMETHASONE SODIUM PHOSPHATE 10 MG/ML IJ SOLN
4.0000 mg | Freq: Once | INTRAMUSCULAR | Status: AC
  Administered 2020-05-13: 4 mg via INTRAVENOUS

## 2020-05-13 MED ORDER — DEXAMETHASONE SODIUM PHOSPHATE 10 MG/ML IJ SOLN
INTRAMUSCULAR | Status: AC
  Filled 2020-05-13: qty 1

## 2020-05-13 MED ORDER — SODIUM CHLORIDE 0.9 % IV SOLN: 4.0000 mg | Freq: Once | INTRAVENOUS | Status: DC

## 2020-05-13 MED ORDER — SODIUM CHLORIDE 0.9 % IV SOLN
Freq: Once | INTRAVENOUS | Status: AC
  Filled 2020-05-13: qty 250

## 2020-05-13 MED ORDER — HEPARIN SOD (PORK) LOCK FLUSH 100 UNIT/ML IV SOLN
500.0000 [IU] | Freq: Once | INTRAVENOUS | Status: AC | PRN
  Administered 2020-05-13: 500 [IU]
  Filled 2020-05-13: qty 5

## 2020-05-13 MED ORDER — FAMOTIDINE IN NACL 20-0.9 MG/50ML-% IV SOLN
20.0000 mg | Freq: Once | INTRAVENOUS | Status: AC
  Administered 2020-05-13: 20 mg via INTRAVENOUS

## 2020-05-13 NOTE — Patient Instructions (Signed)
[facility] Discharge Instructions for Patients Receiving Chemotherapy  Today you received the following chemotherapy agents: taxol, carboplatin   To help prevent nausea and vomiting after your treatment, we encourage you to take your nausea medication as directed.    If you develop nausea and vomiting that is not controlled by your nausea medication, call the clinic.   BELOW ARE SYMPTOMS THAT SHOULD BE REPORTED IMMEDIATELY:  *FEVER GREATER THAN 100.5 F  *CHILLS WITH OR WITHOUT FEVER  NAUSEA AND VOMITING THAT IS NOT CONTROLLED WITH YOUR NAUSEA MEDICATION  *UNUSUAL SHORTNESS OF BREATH  *UNUSUAL BRUISING OR BLEEDING  TENDERNESS IN MOUTH AND THROAT WITH OR WITHOUT PRESENCE OF ULCERS  *URINARY PROBLEMS  *BOWEL PROBLEMS  UNUSUAL RASH Items with * indicate a potential emergency and should be followed up as soon as possible.  Feel free to call the clinic should you have any questions or concerns. The clinic phone number is [phone].  Please show the CHEMO ALERT CARD at check-in to the Emergency Department and triage nurse.   

## 2020-05-13 NOTE — Patient Instructions (Signed)

## 2020-05-13 NOTE — Progress Notes (Signed)
[facility]  Telephone:([phone] Fax:([phone]) [phone]     ID: [name] DOB: 08-Jan-1976  MR#: [id number]  OBO#:[id number]  Patient Care Team: Myles Lipps, MD as PCP - General (Family Medicine) Emelia Loron, MD as Consulting Physician (General Surgery) Shamyah Stantz, Valentino Hue, MD as Consulting Physician (Oncology) Lonie Peak, MD as Attending Physician (Radiation Oncology) Donnelly Angelica, RN as Oncology Nurse Navigator Pershing Proud, RN as Oncology Nurse Navigator Lowella Dell, MD OTHER MD:  CHIEF COMPLAINT: Triple negative breast cancer  CURRENT TREATMENT: Neoadjuvant chemotherapy   INTERVAL HISTORY:  [name] returns today for follow up and treatment of her triple negative breast cancer.  She is accompanied by a [place] interpreter.  She is now receiving weekly Paclitaxel and Carboplatin. Today is week 6 of 12 planned.   REVIEW OF SYSTEMS: Calissa is tolerating the treatments moderately well.  She feels tired for a day or 2 after treatment, then beginning on day 4 and continuing through day 6 she has a strange pain in the upper back and neck which is very bothersome to her.  It is not associated with shortness of breath redness or any other symptoms.  She takes Tylenol for this.  She says that her eyes are tired.  She has lost her eyelashes.  Her dexamethasone was decreased last week to 1 tablet twice daily and despite that she had no nausea and certainly no vomiting.  She continues to have stomach problems and some groin discomfort.  She was started on MiraLAX and she is now less constipated.  She says that about day 5 she gets a little bit of numbness in both feet, not in the hands, and not in the toes.  Not completely resolves after a day or 2.  A detailed review of systems today was otherwise noncontributory   HISTORY OF CURRENT ILLNESS: From the original intake note:  [name] herself palpated a left beast lump with associated pain. She  underwent bilateral diagnostic mammography with tomography and left breast ultrasonography at [facility] on 01/23/2020 showing: breast density category B; palpable 3.6 cm left breast mass with associated calcifications at 1 o'clock; 0.8 cm left breast mass at 2 o'clock, concerning for abnormal intramammary lymph node; at least two abnormal left axillary lymph nodes.  Accordingly on 01/23/2020 she proceeded to biopsy of the left breast area in question. The pathology from this procedure ([id number]) showed: invasive ductal carcinoma at 1 o'clock, grade 3. Prognostic indicators significant for: estrogen receptor, 20% positive with moderate staining intensity and progesterone receptor, 0% negative. Proliferation marker Ki67 at 40%. HER2 negative by immunohistochemistry (1+).  The left axillary lymph node confirmed metastatic carcinoma.  The left intramammary lymph node was negative for carcinoma.  The patient's subsequent history is as detailed below.   PAST MEDICAL HISTORY: Past Medical History:  Diagnosis Date   Allergy    Asthma    Chronic back pain    Hx of migraines    Medical history non-contributory     PAST SURGICAL HISTORY: Past Surgical History:  Procedure Laterality Date   CESAREAN SECTION WITH BILATERAL TUBAL LIGATION Bilateral 07/23/2015   Procedure: CESAREAN SECTION WITH BILATERAL TUBAL LIGATION;  Surgeon: Reva Bores, MD;  Location: [facility];  Service: Obstetrics;  Laterality: Bilateral;   PORTACATH PLACEMENT Right 02/11/2020   Procedure: INSERTION PORT-A-CATH WITH ULTRASOUND GUIDANCE;  Surgeon: Emelia Loron, MD;  Location: [facility];  Service: General;  Laterality: Right;   TUBAL LIGATION  FAMILY HISTORY: No family history on file.  Her parents are both living as of 01/2020, her father age 37 and her mother age 79. She has three maternal half-siblings (1 sister, 2 brothers) and four paternal half-siblings (1 sister, 3 brothers). There  is no family history of cancer to her knowledge.   GYNECOLOGIC HISTORY:  Patient's last menstrual period was 01/20/2020 (exact date). Menarche: 44 years old Age at first live birth: 44 years old GX P 3 LMP 01/2020 Contraceptive s/p tubal ligation HRT n/a  Hysterectomy? no BSO? 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 Husband [name] works for a Holiday representative, both in [employer]. They are both from [place]. She lives at home with [place] and their three children-- [name], [name], and [address]. She attends an [employer].    ADVANCED DIRECTIVES: In the absence of any documentation to the contrary, the patient's spouse is their HCPOA.    HEALTH MAINTENANCE: Social History   Tobacco Use   Smoking status: Never Smoker   Smokeless tobacco: Never Used  Vaping Use   Vaping Use: Never used  Substance Use Topics   Alcohol use: No   Drug use: No     Colonoscopy: n/a (age)  PAP: 11/2017, negative (per patient)  Bone density: n/a (age)   Allergies  Allergen Reactions   Doxorubicin Hcl Rash    Experience chest tightness, abdominal pain, and redness around lips during and shortly after doxorubicin bolus.     Current Outpatient Medications  Medication Sig Dispense Refill   albuterol (VENTOLIN HFA) 108 (90 Base) MCG/ACT inhaler Inhale into the lungs every 6 (six) hours as needed for wheezing or shortness of breath.     azelastine (ASTELIN) 0.1 % nasal spray Place 2 sprays into both nostrils 2 (two) times daily. Use in each nostril as directed 30 mL 12   dexamethasone (DECADRON) 4 MG tablet Take 1 tablets (8 mg) by mouth daily with breakfast starting the day after chemotherapy x 2 days 30 tablet 1   fluticasone (FLONASE) 50 MCG/ACT nasal spray Place 2 sprays into both nostrils daily. 16 g 2   lidocaine-prilocaine (EMLA) cream Apply to affected area once 30 g 3    LORazepam (ATIVAN) 0.5 MG tablet Take 1 tablet at bedtime on days 1 ,2 and 3 of each chemotherapy treatment, then at bedtime as needed 30 tablet 0   magic mouthwash SOLN Take 5 mLs by mouth 3 (three) times daily as needed for mouth pain. 240 mL 0   pantoprazole (PROTONIX) 40 MG tablet Take 1 tablet (40 mg total) by mouth 2 (two) times daily. 60 tablet 1   prochlorperazine (COMPAZINE) 10 MG tablet Take 1 tablet (10 mg total) by mouth 3 (three) times daily before meals for 3 days. 30 tablet 1   sodium chloride (OCEAN) 0.65 % SOLN nasal spray Place 2 sprays into both nostrils as needed for congestion. 30 mL 0   valACYclovir (VALTREX) 500 MG tablet Take 1 tablet (500 mg total) by mouth 2 (two) times daily. 60 tablet 1   No current facility-administered medications for this visit.    OBJECTIVE: Spanish speaker in no acute distress  Vitals:   05/13/20 1325  BP: 117/63  Pulse: 91  Resp: 18  Temp: 97.9 F (36.6 C)  SpO2: 100%     Body mass index is 35.79 kg/m.   Wt Readings from Last 3 Encounters:  05/13/20  195 lb 11.2 oz (88.8 kg)  04/29/20 195 lb 6.4 oz (88.6 kg)  04/22/20 194 lb 12.8 oz (88.4 kg)      ECOG FS:1 - Symptomatic but completely ambulatory  Sclerae unicteric, EOMs intact Wearing a mask No cervical or supraclavicular adenopathy Lungs no rales or rhonchi Heart regular rate and rhythm Abd soft, nontender, positive bowel sounds MSK no focal spinal tenderness, no upper extremity lymphedema Neuro: nonfocal, well oriented, appropriate affect Breasts: I no longer palpate a mass in the left breast.  The right breast is benign.  Both axillae are benign.   LAB RESULTS:  CMP     Component Value Date/Time   NA 138 05/06/2020 1030   NA 138 08/15/2017 1525   K 3.8 05/06/2020 1030   CL 102 05/06/2020 1030   CO2 31 05/06/2020 1030   GLUCOSE 111 (H) 05/06/2020 1030   BUN 12 05/06/2020 1030   BUN 12 08/15/2017 1525   CREATININE 0.67 05/06/2020 1030   CREATININE 0.77  02/26/2020 0915   CALCIUM 9.7 05/06/2020 1030   PROT 6.2 (L) 05/06/2020 1030   PROT 7.4 01/03/2017 1642   ALBUMIN 3.5 05/06/2020 1030   ALBUMIN 4.3 01/03/2017 1642   AST 14 (L) 05/06/2020 1030   AST 17 02/26/2020 0915   ALT 20 05/06/2020 1030   ALT 21 02/26/2020 0915   ALKPHOS 77 05/06/2020 1030   BILITOT 0.3 05/06/2020 1030   BILITOT <0.2 (L) 02/26/2020 0915   GFRNONAA >60 05/06/2020 1030   GFRNONAA >60 02/26/2020 0915   GFRAA >60 05/06/2020 1030   GFRAA >60 02/26/2020 0915    No results found for: TOTALPROTELP, ALBUMINELP, A1GS, A2GS, BETS, BETA2SER, GAMS, MSPIKE, SPEI  Lab Results  Component Value Date   WBC 4.0 05/13/2020   NEUTROABS 2.6 05/13/2020   HGB 9.5 (L) 05/13/2020   HCT 29.2 (L) 05/13/2020   MCV 91.3 05/13/2020   PLT 218 05/13/2020    No results found for: LABCA2  No components found for: VPXTGG269  No results for input(s): INR in the last 168 hours.  No results found for: LABCA2  No results found for: SWN462  No results found for: VOJ500  No results found for: XFG182  No results found for: CA2729  No components found for: HGQUANT  No results found for: CEA1 / No results found for: CEA1   No results found for: AFPTUMOR  No results found for: CHROMOGRNA  No results found for: KPAFRELGTCHN, LAMBDASER, KAPLAMBRATIO (kappa/lambda light chains)  No results found for: HGBA, HGBA2QUANT, HGBFQUANT, HGBSQUAN (Hemoglobinopathy evaluation)   No results found for: LDH  No results found for: IRON, TIBC, IRONPCTSAT (Iron and TIBC)  No results found for: FERRITIN  Urinalysis No results found for: COLORURINE, APPEARANCEUR, LABSPEC, PHURINE, GLUCOSEU, HGBUR, BILIRUBINUR, KETONESUR, PROTEINUR, UROBILINOGEN, NITRITE, LEUKOCYTESUR   STUDIES: DG Abd 2 Views  Result Date: 04/29/2020 CLINICAL DATA:  Malignant neoplasm of upper-outer quadrant of left breast in female, estrogen receptor positive. Abdominal discomfort, gas. Additional history provided:  Patient reports pain across upper and lower abdomen for 1.5-2 months. Pain is increasing. EXAM: ABDOMEN - 2 VIEW COMPARISON:  Radiographs of the abdomen 03/19/2020. FINDINGS: No dilated loops of small bowel are demonstrated to suggest small bowel obstruction. No evidence of free air on the upright radiograph. There is a moderate amount of stool within the right colon. No acute bony abnormality identified. Partially imaged central venous catheter terminating within the upper right atrium. Tubal ligation clips within the pelvis. IMPRESSION: No radiographic evidence of  small-bowel obstruction. Moderate amount of stool within the right colon. Electronically Signed   By: Kellie Simmering DO   On: 04/29/2020 14:49     ELIGIBLE FOR AVAILABLE RESEARCH PROTOCOL: S9702?  ASSESSMENT: 44 y.o. [place] woman status post left breast upper outer quadrant biopsy 01/23/2020 for a clinical T3 N1, 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  (a) chest CT scan and bone scan 02/07/2020 showed no evidence of metastatic disease  (1) neoadjuvant chemotherapy will consist of doxorubicin and cyclophosphamide in dose dense fashion x4 starting 02/12/2020 to be followed by paclitaxel and carboplatin weekly x12  (a) echo 02/07/2020 shows an ejection fraction in the 55-60% range  (b) Epirubicin substituted for Doxorubicin secondary to allergic rash from Doxorubicin  (2) definitive surgery to follow  (3) adjuvant radiation  (4) genetics testing 02/07/2020 through the [facility] Common Hereditary Cancers Panel found no deleterious mutations in  APC, ATM, AXIN2, BARD1, BMPR1A, BRCA1, BRCA2, BRIP1, CDH1, CDKN2A (p14ARF), CDKN2A (p16INK4a), CKD4, CHEK2, CTNNA1, DICER1, EPCAM (Deletion/duplication testing only), GREM1 (promoter region deletion/duplication testing only), KIT, MEN1, MLH1, MSH2, MSH3, MSH6, MUTYH, NBN, NF1, NHTL1, PALB2, PDGFRA, PMS2, POLD1, POLE, PTEN, RAD50, RAD51C,  RAD51D, RNF43, SDHB, SDHC, SDHD, SMAD4, SMARCA4. STK11, TP53, TSC1, TSC2, and VHL.  The following genes were evaluated for sequence changes only: SDHA and HOXB13 c.251G>A variant only.  (a) VUS in MSH6 called c.2744C>G identified.     PLAN: Jaxson is tolerating the paclitaxel and carboplatin moderately well and will receive her sixth of 12 planned weekly doses today.  She has had some unusual symptoms including numbness in her feet (not the tips of her toes) occurring perhaps day 4 or 5 and very transiently.  I do not think this is peripheral neuropathy and accordingly we are not stopping her chemotherapy on that account.  She was reassured that her blurred vision is due to accommodation and will resolve after the chemotherapy has been completed.  I think some of the symptoms she is experiencing may be due to the dexamethasone.  Accordingly I instructed her not to take dexamethasone for nausea control.  Instead she will take Compazine before breakfast lunch and supper on day 2 and thereafter only as needed.  If she has any problems with nausea despite that she will call us.  She will return next week and we will again assess for peripheral neuropathy and check her counts.  She is already scheduled for a return visit in 2 weeks as well  Total encounter time 30 minutes.Sarajane Jews C. Dorina Ribaudo, MD 05/13/20 1:50 PM Medical Oncology and Hematology [facility] [place], [place] [address] Tel. [phone]    Fax. [phone]   I, Wilburn Mylar, am acting as scribe for Dr. Virgie Dad. Geet Hosking.  I, Lurline Del MD, have reviewed the above documentation for accuracy and completeness, and I agree with the above.    *Total Encounter Time as defined by the Centers for Medicare and Medicaid Services includes, in addition to the face-to-face time of a patient visit (documented in the note above) non-face-to-face time: obtaining and reviewing outside history,  ordering and reviewing medications, tests or procedures, care coordination (communications with other health care professionals or caregivers) and documentation in the medical record.

## 2020-05-20 ENCOUNTER — Inpatient Hospital Stay: Payer: Self-pay

## 2020-05-20 ENCOUNTER — Inpatient Hospital Stay (HOSPITAL_BASED_OUTPATIENT_CLINIC_OR_DEPARTMENT_OTHER): Payer: Self-pay | Admitting: Medical

## 2020-05-20 ENCOUNTER — Other Ambulatory Visit: Payer: Self-pay

## 2020-05-20 VITALS — BP 144/84 | HR 96 | Temp 97.8°F | Resp 20 | Ht 62.0 in | Wt 198.1 lb

## 2020-05-20 DIAGNOSIS — Z170 Estrogen receptor positive status [ER+]: Secondary | ICD-10-CM

## 2020-05-20 DIAGNOSIS — C50412 Malignant neoplasm of upper-outer quadrant of left female breast: Secondary | ICD-10-CM

## 2020-05-20 DIAGNOSIS — G629 Polyneuropathy, unspecified: Secondary | ICD-10-CM

## 2020-05-20 LAB — CBC WITH DIFFERENTIAL/PLATELET
Abs Immature Granulocytes: 0.08 10*3/uL — ABNORMAL HIGH (ref 0.00–0.07)
Basophils Absolute: 0 10*3/uL (ref 0.0–0.1)
Basophils Relative: 0 %
Eosinophils Absolute: 0 10*3/uL (ref 0.0–0.5)
Eosinophils Relative: 0 %
HCT: 28.1 % — ABNORMAL LOW (ref 36.0–46.0)
Hemoglobin: 9.3 g/dL — ABNORMAL LOW (ref 12.0–15.0)
Immature Granulocytes: 3 %
Lymphocytes Relative: 31 %
Lymphs Abs: 1 10*3/uL (ref 0.7–4.0)
MCH: 30.1 pg (ref 26.0–34.0)
MCHC: 33.1 g/dL (ref 30.0–36.0)
MCV: 90.9 fL (ref 80.0–100.0)
Monocytes Absolute: 0.2 10*3/uL (ref 0.1–1.0)
Monocytes Relative: 6 %
Neutro Abs: 1.9 10*3/uL (ref 1.7–7.7)
Neutrophils Relative %: 60 %
Platelets: 214 10*3/uL (ref 150–400)
RBC: 3.09 MIL/uL — ABNORMAL LOW (ref 3.87–5.11)
RDW: 17 % — ABNORMAL HIGH (ref 11.5–15.5)
WBC: 3.2 10*3/uL — ABNORMAL LOW (ref 4.0–10.5)
nRBC: 0 % (ref 0.0–0.2)

## 2020-05-20 LAB — COMPREHENSIVE METABOLIC PANEL
ALT: 26 U/L (ref 0–44)
AST: 18 U/L (ref 15–41)
Albumin: 3.5 g/dL (ref 3.5–5.0)
Alkaline Phosphatase: 83 U/L (ref 38–126)
Anion gap: 10 (ref 5–15)
BUN: 11 mg/dL (ref 6–20)
CO2: 25 mmol/L (ref 22–32)
Calcium: 9.5 mg/dL (ref 8.9–10.3)
Chloride: 106 mmol/L (ref 98–111)
Creatinine, Ser: 0.74 mg/dL (ref 0.44–1.00)
GFR calc Af Amer: >60 mL/min (ref >60)
GFR calc non Af Amer: >60 mL/min (ref >60)
Glucose, Bld: 111 mg/dL — ABNORMAL HIGH (ref 70–99)
Potassium: 3.6 mmol/L (ref 3.5–5.1)
Sodium: 141 mmol/L (ref 135–145)
Total Bilirubin: <0.2 mg/dL — ABNORMAL LOW (ref 0.3–1.2)
Total Protein: 6.6 g/dL (ref 6.5–8.1)

## 2020-05-20 MED ORDER — GABAPENTIN 300 MG PO CAPS: 300.0000 mg | ORAL_CAPSULE | Freq: Every day | ORAL | 3 refills | Status: DC

## 2020-05-20 MED ORDER — DIPHENHYDRAMINE HCL 50 MG/ML IJ SOLN
INTRAMUSCULAR | Status: AC
  Filled 2020-05-20: qty 1

## 2020-05-20 MED ORDER — PALONOSETRON HCL INJECTION 0.25 MG/5ML
0.2500 mg | Freq: Once | INTRAVENOUS | Status: AC
  Administered 2020-05-20: 0.25 mg via INTRAVENOUS

## 2020-05-20 MED ORDER — HEPARIN SOD (PORK) LOCK FLUSH 100 UNIT/ML IV SOLN
500.0000 [IU] | Freq: Once | INTRAVENOUS | Status: AC | PRN
  Administered 2020-05-20: 500 [IU]
  Filled 2020-05-20: qty 5

## 2020-05-20 MED ORDER — PALONOSETRON HCL INJECTION 0.25 MG/5ML
INTRAVENOUS | Status: AC
  Filled 2020-05-20: qty 5

## 2020-05-20 MED ORDER — FAMOTIDINE IN NACL 20-0.9 MG/50ML-% IV SOLN
INTRAVENOUS | Status: AC
  Filled 2020-05-20: qty 50

## 2020-05-20 MED ORDER — SODIUM CHLORIDE 0.9 % IV SOLN
80.0000 mg/m2 | Freq: Once | INTRAVENOUS | Status: AC
  Administered 2020-05-20: 156 mg via INTRAVENOUS
  Filled 2020-05-20: qty 26

## 2020-05-20 MED ORDER — SODIUM CHLORIDE 0.9% FLUSH
10.0000 mL | INTRAVENOUS | Status: DC | PRN
  Administered 2020-05-20: 10 mL
  Filled 2020-05-20: qty 10

## 2020-05-20 MED ORDER — DEXAMETHASONE SODIUM PHOSPHATE 10 MG/ML IJ SOLN
4.0000 mg | Freq: Once | INTRAMUSCULAR | Status: AC
  Administered 2020-05-20: 4 mg via INTRAVENOUS

## 2020-05-20 MED ORDER — SODIUM CHLORIDE 0.9 % IV SOLN
Freq: Once | INTRAVENOUS | Status: AC
  Filled 2020-05-20: qty 250

## 2020-05-20 MED ORDER — FAMOTIDINE IN NACL 20-0.9 MG/50ML-% IV SOLN
20.0000 mg | Freq: Once | INTRAVENOUS | Status: AC
  Administered 2020-05-20: 20 mg via INTRAVENOUS

## 2020-05-20 MED ORDER — SODIUM CHLORIDE 0.9 % IV SOLN
300.0000 mg | Freq: Once | INTRAVENOUS | Status: AC
  Administered 2020-05-20: 300 mg via INTRAVENOUS
  Filled 2020-05-20: qty 30

## 2020-05-20 MED ORDER — DIPHENHYDRAMINE HCL 50 MG/ML IJ SOLN
25.0000 mg | Freq: Once | INTRAMUSCULAR | Status: AC
  Administered 2020-05-20: 25 mg via INTRAVENOUS

## 2020-05-20 MED ORDER — DEXAMETHASONE SODIUM PHOSPHATE 10 MG/ML IJ SOLN
INTRAMUSCULAR | Status: AC
  Filled 2020-05-20: qty 1

## 2020-05-20 NOTE — Progress Notes (Signed)
[facility] Progress Note    [name] [id number] [birth year] 44 y.o.  [name] is managed by Dr. Lurline Del  Actively treated with chemotherapy/immunotherapy/hormonal therapy: yes  Current therapy: carboplatin and paclitaxel  Last treated: 05/13/2020 (cycle 10, day 1)  Next scheduled appointment with provider: 05/28/2020  Assessment: Plan:    Malignant neoplasm of upper-outer quadrant of left breast in female, estrogen receptor positive ([place])  Neuropathy   ER positive malignant neoplasm of the left breast:   The patient presents to clinic today for consideration of cycle 11, day 1 of carboplatin and paclitaxel.  We will proceed with her treatment today and will have her return as scheduled on 05/28/2020 for her next visit.  Peripheral neuropathy: The patient was given a prescription for Neurontin 300 mg p.o. nightly.  Please see After Visit Summary for patient specific instructions.  Future Appointments  Date Time Provider [place]  05/28/2020 12:45 PM [facility] LAB [facility] None  05/28/2020  1:00 PM [facility] [place] FLUSH [facility] None  05/28/2020  1:30 PM Alyjah Lovingood, Lucianne Lei E., PA-C [facility] None  05/28/2020  2:15 PM [facility] INFUSION [facility] None  06/03/2020  9:45 AM [facility] LAB [facility] None  06/03/2020 10:00 AM [facility] [place] FLUSH [facility] None  06/03/2020 10:30 AM Causey, Charlestine Massed, NP [facility] None  06/03/2020 11:15 AM [facility] INFUSION [facility] None    No orders of the defined types were placed in this encounter.      Subjective:   Patient ID:  [name] is a 44 y.o. (DOB 04/19/1976) female.  Chief Complaint: No chief complaint on file.   HPI [name]  is a 44 y.o. female with a diagnosis of an ER positive malignant neoplasm of the left breast.  She is managed by Dr. Jana Hakim and presents to clinic today for consideration of cycle 11, day 1 of carboplatin and paclitaxel.  She  was seen with the help of an interpreter today.  She reports that she is tired, has bone pain, occasional chills, sweats, and numbness and tingling in her feet.  She denies fever weight changes nausea vomiting or changes in her appetite.  Medications: I have reviewed the patient's current medications.  Allergies:  Allergies  Allergen Reactions  . Doxorubicin Hcl Rash    Experience chest tightness, abdominal pain, and redness around lips during and shortly after doxorubicin bolus.     Past Medical History:  Diagnosis Date  . Allergy   . Asthma   . Chronic back pain   . Hx of migraines   . Medical history non-contributory     Past Surgical History:  Procedure Laterality Date  . CESAREAN SECTION WITH BILATERAL TUBAL LIGATION Bilateral 07/23/2015   Procedure: CESAREAN SECTION WITH BILATERAL TUBAL LIGATION;  Surgeon: Donnamae Jude, MD;  Location: [facility];  Service: Obstetrics;  Laterality: Bilateral;  . PORTACATH PLACEMENT Right 02/11/2020   Procedure: INSERTION PORT-A-CATH WITH ULTRASOUND GUIDANCE;  Surgeon: Rolm Bookbinder, MD;  Location: [facility];  Service: General;  Laterality: Right;  . TUBAL LIGATION      No family history on file.  Social History   Socioeconomic History  . Marital status: Single    Spouse name: Not on file  . Number of children: 3  . Years of education: Not on file  . Highest education level: 9th grade  Occupational History  . Not on file  Tobacco Use  . Smoking status: Never Smoker  . Smokeless tobacco: Never Used  Vaping Use  . Vaping Use: Never used  Substance and Sexual Activity  . Alcohol use: No  . Drug use: No  . Sexual activity: Yes    Birth control/protection: Surgical  Other Topics Concern  . Not on file  Social History Narrative  . Not on file   Social Determinants of Health   Financial Resource Strain:   . Difficulty of Paying Living Expenses: Not on file  Food Insecurity:   . Worried About Sales executive in the Last Year: Not on file  . Ran Out of Food in the Last Year: Not on file  Transportation Needs: No Transportation Needs  . Lack of Transportation (Medical): No  . Lack of Transportation (Non-Medical): No  Physical Activity:   . Days of Exercise per Week: Not on file  . Minutes of Exercise per Session: Not on file  Stress:   . Feeling of Stress : Not on file  Social Connections:   . Frequency of Communication with Friends and Family: Not on file  . Frequency of Social Gatherings with Friends and Family: Not on file  . Attends Religious Services: Not on file  . Active Member of Clubs or Organizations: Not on file  . Attends Archivist Meetings: Not on file  . Marital Status: Not on file  Intimate Partner Violence:   . Fear of Current or Ex-Partner: Not on file  . Emotionally Abused: Not on file  . Physically Abused: Not on file  . Sexually Abused: Not on file    Past Medical History, Surgical history, Social history, and Family history were reviewed and updated as appropriate.   Please see review of systems for further details on the patient's review from today.   Review of Systems:  Review of Systems  Constitutional: Positive for chills, diaphoresis and fatigue. Negative for fever.  HENT: Negative for trouble swallowing and voice change.   Respiratory: Negative for cough, chest tightness, shortness of breath and wheezing.   Cardiovascular: Negative for chest pain and palpitations.  Gastrointestinal: Negative for abdominal pain, constipation, diarrhea, nausea and vomiting.  Musculoskeletal: Positive for arthralgias. Negative for back pain and myalgias.  Neurological: Positive for numbness. Negative for dizziness, light-headedness and headaches.    Objective:   Physical Exam:  BP (!) 144/84 (BP Location: Right Arm, Patient Position: Sitting)   Pulse 96   Temp 97.8 F (36.6 C)   Resp 20   Ht 5\' 2"  (1.575 m)   Wt 198 lb 1.6 oz (89.9 kg)   LMP  01/20/2020 (Exact Date)   SpO2 100%   BMI 36.23 kg/m  ECOG: 0  Physical Exam Constitutional:      General: She is not in acute distress.    Appearance: She is not diaphoretic.  HENT:     Head: Normocephalic and atraumatic.  Eyes:     General: No scleral icterus.       Right eye: No discharge.        Left eye: No discharge.     Conjunctiva/sclera: Conjunctivae normal.  Cardiovascular:     Rate and Rhythm: Normal rate and regular rhythm.     Heart sounds: Normal heart sounds. No murmur heard.  No friction rub. No gallop.   Pulmonary:     Effort: Pulmonary effort is normal. No respiratory distress.     Breath sounds: Normal breath sounds. No wheezing or rales.  Abdominal:     General: Bowel sounds are normal. There is no distension.  Tenderness: There is no abdominal tenderness. There is no guarding.  Musculoskeletal:     Right lower leg: No edema.     Left lower leg: No edema.  Skin:    General: Skin is warm and dry.     Findings: No erythema or rash.  Neurological:     Mental Status: She is alert.     Coordination: Coordination normal.     Gait: Gait normal.     Lab Review:     Component Value Date/Time   NA 141 05/20/2020 1000   NA 138 08/15/2017 1525   K 3.6 05/20/2020 1000   CL 106 05/20/2020 1000   CO2 25 05/20/2020 1000   GLUCOSE 111 (H) 05/20/2020 1000   BUN 11 05/20/2020 1000   BUN 12 08/15/2017 1525   CREATININE 0.74 05/20/2020 1000   CREATININE 0.77 02/26/2020 0915   CALCIUM 9.5 05/20/2020 1000   PROT 6.6 05/20/2020 1000   PROT 7.4 01/03/2017 1642   ALBUMIN 3.5 05/20/2020 1000   ALBUMIN 4.3 01/03/2017 1642   AST 18 05/20/2020 1000   AST 17 02/26/2020 0915   ALT 26 05/20/2020 1000   ALT 21 02/26/2020 0915   ALKPHOS 83 05/20/2020 1000   BILITOT <0.2 (L) 05/20/2020 1000   BILITOT <0.2 (L) 02/26/2020 0915   GFRNONAA >60 05/20/2020 1000   GFRNONAA >60 02/26/2020 0915   GFRAA >60 05/20/2020 1000   GFRAA >60 02/26/2020 0915       Component  Value Date/Time   WBC 3.2 (L) 05/20/2020 1000   RBC 3.09 (L) 05/20/2020 1000   HGB 9.3 (L) 05/20/2020 1000   HGB 12.0 02/26/2020 0915   HCT 28.1 (L) 05/20/2020 1000   PLT 214 05/20/2020 1000   PLT 158 02/26/2020 0915   MCV 90.9 05/20/2020 1000   MCV 80.9 01/03/2017 1651   MCH 30.1 05/20/2020 1000   MCHC 33.1 05/20/2020 1000   RDW 17.0 (H) 05/20/2020 1000   LYMPHSABS 1.0 05/20/2020 1000   MONOABS 0.2 05/20/2020 1000   EOSABS 0.0 05/20/2020 1000   BASOSABS 0.0 05/20/2020 1000   -------------------------------  Imaging from last 24 hours (if applicable):  Radiology interpretation: DG Abd 2 Views  Result Date: 04/29/2020 CLINICAL DATA:  Malignant neoplasm of upper-outer quadrant of left breast in female, estrogen receptor positive. Abdominal discomfort, gas. Additional history provided: Patient reports pain across upper and lower abdomen for 1.5-2 months. Pain is increasing. EXAM: ABDOMEN - 2 VIEW COMPARISON:  Radiographs of the abdomen 03/19/2020. FINDINGS: No dilated loops of small bowel are demonstrated to suggest small bowel obstruction. No evidence of free air on the upright radiograph. There is a moderate amount of stool within the right colon. No acute bony abnormality identified. Partially imaged central venous catheter terminating within the upper right atrium. Tubal ligation clips within the pelvis. IMPRESSION: No radiographic evidence of small-bowel obstruction. Moderate amount of stool within the right colon. Electronically Signed   By: Kellie Simmering DO   On: 04/29/2020 14:49      OK to treat.  Sandi Mealy, MHS, PA-C

## 2020-05-20 NOTE — Patient Instructions (Signed)
[facility] Discharge Instructions for Patients Receiving Chemotherapy  Today you received the following chemotherapy agents Taxol, Carboplatin  To help prevent nausea and vomiting after your treatment, we encourage you to take your nausea medication as directed  If you develop nausea and vomiting that is not controlled by your nausea medication, call the clinic.   BELOW ARE SYMPTOMS THAT SHOULD BE REPORTED IMMEDIATELY:  *FEVER GREATER THAN 100.5 F  *CHILLS WITH OR WITHOUT FEVER  NAUSEA AND VOMITING THAT IS NOT CONTROLLED WITH YOUR NAUSEA MEDICATION  *UNUSUAL SHORTNESS OF BREATH  *UNUSUAL BRUISING OR BLEEDING  TENDERNESS IN MOUTH AND THROAT WITH OR WITHOUT PRESENCE OF ULCERS  *URINARY PROBLEMS  *BOWEL PROBLEMS  UNUSUAL RASH Items with * indicate a potential emergency and should be followed up as soon as possible.  Feel free to call the clinic should you have any questions or concerns. The clinic phone number is [phone].  Please show the CHEMO ALERT CARD at check-in to the Emergency Department and triage nurse.   

## 2020-05-28 ENCOUNTER — Inpatient Hospital Stay: Payer: Self-pay

## 2020-05-28 ENCOUNTER — Other Ambulatory Visit: Payer: Self-pay

## 2020-05-28 ENCOUNTER — Encounter: Payer: Self-pay | Admitting: *Deleted

## 2020-05-28 ENCOUNTER — Inpatient Hospital Stay (HOSPITAL_BASED_OUTPATIENT_CLINIC_OR_DEPARTMENT_OTHER): Payer: Self-pay | Admitting: Medical

## 2020-05-28 VITALS — BP 123/78 | HR 88 | Temp 97.6°F | Resp 18 | Ht 62.0 in | Wt 199.5 lb

## 2020-05-28 DIAGNOSIS — Z95828 Presence of other vascular implants and grafts: Secondary | ICD-10-CM

## 2020-05-28 DIAGNOSIS — C50412 Malignant neoplasm of upper-outer quadrant of left female breast: Secondary | ICD-10-CM

## 2020-05-28 DIAGNOSIS — G629 Polyneuropathy, unspecified: Secondary | ICD-10-CM

## 2020-05-28 DIAGNOSIS — Z170 Estrogen receptor positive status [ER+]: Secondary | ICD-10-CM

## 2020-05-28 LAB — CBC WITH DIFFERENTIAL/PLATELET
Abs Immature Granulocytes: 0.1 10*3/uL — ABNORMAL HIGH (ref 0.00–0.07)
Basophils Absolute: 0 10*3/uL (ref 0.0–0.1)
Basophils Relative: 0 %
Eosinophils Absolute: 0 10*3/uL (ref 0.0–0.5)
Eosinophils Relative: 0 %
HCT: 29.8 % — ABNORMAL LOW (ref 36.0–46.0)
Hemoglobin: 9.6 g/dL — ABNORMAL LOW (ref 12.0–15.0)
Immature Granulocytes: 3 %
Lymphocytes Relative: 33 %
Lymphs Abs: 1.3 10*3/uL (ref 0.7–4.0)
MCH: 29.8 pg (ref 26.0–34.0)
MCHC: 32.2 g/dL (ref 30.0–36.0)
MCV: 92.5 fL (ref 80.0–100.0)
Monocytes Absolute: 0.4 10*3/uL (ref 0.1–1.0)
Monocytes Relative: 11 %
Neutro Abs: 2 10*3/uL (ref 1.7–7.7)
Neutrophils Relative %: 53 %
Platelets: 250 10*3/uL (ref 150–400)
RBC: 3.22 MIL/uL — ABNORMAL LOW (ref 3.87–5.11)
RDW: 16.7 % — ABNORMAL HIGH (ref 11.5–15.5)
WBC: 3.8 10*3/uL — ABNORMAL LOW (ref 4.0–10.5)
nRBC: 0.5 % — ABNORMAL HIGH (ref 0.0–0.2)

## 2020-05-28 LAB — COMPREHENSIVE METABOLIC PANEL
ALT: 25 U/L (ref 0–44)
AST: 20 U/L (ref 15–41)
Albumin: 3.6 g/dL (ref 3.5–5.0)
Alkaline Phosphatase: 91 U/L (ref 38–126)
Anion gap: 6 (ref 5–15)
BUN: 10 mg/dL (ref 6–20)
CO2: 28 mmol/L (ref 22–32)
Calcium: 9.3 mg/dL (ref 8.9–10.3)
Chloride: 105 mmol/L (ref 98–111)
Creatinine, Ser: 0.74 mg/dL (ref 0.44–1.00)
GFR calc Af Amer: >60 mL/min (ref >60)
GFR calc non Af Amer: >60 mL/min (ref >60)
Glucose, Bld: 92 mg/dL (ref 70–99)
Potassium: 3.5 mmol/L (ref 3.5–5.1)
Sodium: 139 mmol/L (ref 135–145)
Total Bilirubin: 0.2 mg/dL — ABNORMAL LOW (ref 0.3–1.2)
Total Protein: 7 g/dL (ref 6.5–8.1)

## 2020-05-28 MED ORDER — ALTEPLASE 2 MG IJ SOLR
2.0000 mg | Freq: Once | INTRAMUSCULAR | Status: AC
  Administered 2020-05-28: 2 mg
  Filled 2020-05-28: qty 2

## 2020-05-28 MED ORDER — FAMOTIDINE IN NACL 20-0.9 MG/50ML-% IV SOLN
INTRAVENOUS | Status: AC
  Filled 2020-05-28: qty 50

## 2020-05-28 MED ORDER — DEXAMETHASONE SODIUM PHOSPHATE 10 MG/ML IJ SOLN
INTRAMUSCULAR | Status: AC
  Filled 2020-05-28: qty 1

## 2020-05-28 MED ORDER — DIPHENHYDRAMINE HCL 50 MG/ML IJ SOLN
INTRAMUSCULAR | Status: AC
  Filled 2020-05-28: qty 1

## 2020-05-28 MED ORDER — SODIUM CHLORIDE 0.9% FLUSH
10.0000 mL | INTRAVENOUS | Status: DC | PRN
  Administered 2020-05-28: 10 mL
  Filled 2020-05-28: qty 10

## 2020-05-28 MED ORDER — ALTEPLASE 2 MG IJ SOLR
INTRAMUSCULAR | Status: AC
  Filled 2020-05-28: qty 2

## 2020-05-28 MED ORDER — SODIUM CHLORIDE 0.9 % IV SOLN
Freq: Once | INTRAVENOUS | Status: AC
  Filled 2020-05-28: qty 250

## 2020-05-28 MED ORDER — SODIUM CHLORIDE 0.9 % IV SOLN
80.0000 mg/m2 | Freq: Once | INTRAVENOUS | Status: AC
  Administered 2020-05-28: 156 mg via INTRAVENOUS
  Filled 2020-05-28: qty 26

## 2020-05-28 MED ORDER — DIPHENHYDRAMINE HCL 50 MG/ML IJ SOLN
25.0000 mg | Freq: Once | INTRAMUSCULAR | Status: AC
  Administered 2020-05-28: 25 mg via INTRAVENOUS

## 2020-05-28 MED ORDER — SODIUM CHLORIDE 0.9% FLUSH
10.0000 mL | Freq: Once | INTRAVENOUS | Status: AC
  Administered 2020-05-28: 10 mL
  Filled 2020-05-28: qty 10

## 2020-05-28 MED ORDER — FAMOTIDINE IN NACL 20-0.9 MG/50ML-% IV SOLN
20.0000 mg | Freq: Once | INTRAVENOUS | Status: AC
  Administered 2020-05-28: 20 mg via INTRAVENOUS

## 2020-05-28 MED ORDER — DEXAMETHASONE SODIUM PHOSPHATE 10 MG/ML IJ SOLN
4.0000 mg | Freq: Once | INTRAMUSCULAR | Status: AC
  Administered 2020-05-28: 4 mg via INTRAVENOUS

## 2020-05-28 MED ORDER — HEPARIN SOD (PORK) LOCK FLUSH 100 UNIT/ML IV SOLN
500.0000 [IU] | Freq: Once | INTRAVENOUS | Status: AC | PRN
  Administered 2020-05-28: 500 [IU]
  Filled 2020-05-28: qty 5

## 2020-05-28 MED ORDER — PALONOSETRON HCL INJECTION 0.25 MG/5ML
INTRAVENOUS | Status: AC
  Filled 2020-05-28: qty 5

## 2020-05-28 MED ORDER — PALONOSETRON HCL INJECTION 0.25 MG/5ML
0.2500 mg | Freq: Once | INTRAVENOUS | Status: AC
  Administered 2020-05-28: 0.25 mg via INTRAVENOUS

## 2020-05-28 MED ORDER — SODIUM CHLORIDE 0.9 % IV SOLN
300.0000 mg | Freq: Once | INTRAVENOUS | Status: AC
  Administered 2020-05-28: 300 mg via INTRAVENOUS
  Filled 2020-05-28: qty 30

## 2020-05-28 NOTE — Progress Notes (Signed)
Unable to get blood return from port. Blood drawn from arm by Tim RN. Patient sent back to lab to get blood drawn from arm.

## 2020-05-28 NOTE — Patient Instructions (Signed)
[facility] Discharge Instructions for Patients Receiving Chemotherapy  Today you received the following chemotherapy agents Taxol, Carboplatin  To help prevent nausea and vomiting after your treatment, we encourage you to take your nausea medication as directed  If you develop nausea and vomiting that is not controlled by your nausea medication, call the clinic.   BELOW ARE SYMPTOMS THAT SHOULD BE REPORTED IMMEDIATELY:  *FEVER GREATER THAN 100.5 F  *CHILLS WITH OR WITHOUT FEVER  NAUSEA AND VOMITING THAT IS NOT CONTROLLED WITH YOUR NAUSEA MEDICATION  *UNUSUAL SHORTNESS OF BREATH  *UNUSUAL BRUISING OR BLEEDING  TENDERNESS IN MOUTH AND THROAT WITH OR WITHOUT PRESENCE OF ULCERS  *URINARY PROBLEMS  *BOWEL PROBLEMS  UNUSUAL RASH Items with * indicate a potential emergency and should be followed up as soon as possible.  Feel free to call the clinic should you have any questions or concerns. The clinic phone number is [phone].  Please show the CHEMO ALERT CARD at check-in to the Emergency Department and triage nurse.   

## 2020-05-28 NOTE — Progress Notes (Signed)
OK to treat.  [name], MHS, PA-C 

## 2020-05-28 NOTE — Progress Notes (Signed)
Symptoms Management Clinic Progress Note    RIM THATCH [id number] 1976/01/20 44 y.o.  Deriana Vanderhoef Perdomo is managed by Dr. Lurline Del  Actively treated with chemotherapy/immunotherapy/hormonal therapy: yes  Current therapy: carboplatin and paclitaxel  Last treated: 05/13/2020 (cycle 10, day 1)  Next scheduled appointment with provider: 06/03/2020  Assessment: Plan:    No diagnosis found.   ER positive malignant neoplasm of the left breast:   The patient presents to clinic today for consideration of cycle 11, day 1 of carboplatin and paclitaxel.  We will proceed with her treatment today and will have her return as scheduled on 06/03/2020 for her next visit.  Peripheral neuropathy: The patient was given a prescription for Neurontin 300 mg p.o. nightly at her last visit.  She has not noted much difference in her lower extremity neuropathy.  She is agreeable to continue this medication for now.  Please see After Visit Summary for patient specific instructions.  Future Appointments  Date Time Provider [place]  05/28/2020  2:15 PM [facility] INFUSION [facility] None  06/03/2020  9:45 AM [facility] LAB [facility] None  06/03/2020 10:00 AM [facility] [place] FLUSH [facility] None  06/03/2020 10:30 AM Causey, Charlestine Massed, NP [facility] None  06/03/2020 11:15 AM [facility] INFUSION [facility] None    No orders of the defined types were placed in this encounter.      Subjective:   Patient ID:  VALAREE FRESQUEZ is a 44 y.o. (DOB 1975/10/22) female.  Chief Complaint: No chief complaint on file.   HPI BRITISH MOYD  is a 44 y.o. female with a diagnosis of an ER positive malignant neoplasm of the left breast.  She is managed by Dr. Jana Hakim and presents to clinic today for consideration of cycle 12, day 1 of carboplatin and paclitaxel.  She was seen with the help of an interpreter today.  She continues to have numbness and tingling in her feet despite being  placed on Neurontin 300 mg p.o. nightly at her last visit.  She reports that she is sleeping well and has a good appetite.  She reports that she is otherwise doing well with no acute issues of concern.   Medications: I have reviewed the patient's current medications.  Allergies:  Allergies  Allergen Reactions  . Doxorubicin Hcl Rash    Experience chest tightness, abdominal pain, and redness around lips during and shortly after doxorubicin bolus.     Past Medical History:  Diagnosis Date  . Allergy   . Asthma   . Chronic back pain   . Hx of migraines   . Medical history non-contributory     Past Surgical History:  Procedure Laterality Date  . CESAREAN SECTION WITH BILATERAL TUBAL LIGATION Bilateral 07/23/2015   Procedure: CESAREAN SECTION WITH BILATERAL TUBAL LIGATION;  Surgeon: Donnamae Jude, MD;  Location: [facility];  Service: Obstetrics;  Laterality: Bilateral;  . PORTACATH PLACEMENT Right 02/11/2020   Procedure: INSERTION PORT-A-CATH WITH ULTRASOUND GUIDANCE;  Surgeon: Rolm Bookbinder, MD;  Location: [facility];  Service: General;  Laterality: Right;  . TUBAL LIGATION      No family history on file.  Social History   Socioeconomic History  . Marital status: Single    Spouse name: Not on file  . Number of children: 3  . Years of education: Not on file  . Highest education level: 9th grade  Occupational History  . Not on file  Tobacco Use  . Smoking status: Never Smoker  . Smokeless  tobacco: Never Used  Vaping Use  . Vaping Use: Never used  Substance and Sexual Activity  . Alcohol use: No  . Drug use: No  . Sexual activity: Yes    Birth control/protection: Surgical  Other Topics Concern  . Not on file  Social History Narrative  . Not on file   Social Determinants of Health   Financial Resource Strain:   . Difficulty of Paying Living Expenses: Not on file  Food Insecurity:   . Worried About Charity fundraiser in the Last Year: Not on file  .  Ran Out of Food in the Last Year: Not on file  Transportation Needs: No Transportation Needs  . Lack of Transportation (Medical): No  . Lack of Transportation (Non-Medical): No  Physical Activity:   . Days of Exercise per Week: Not on file  . Minutes of Exercise per Session: Not on file  Stress:   . Feeling of Stress : Not on file  Social Connections:   . Frequency of Communication with Friends and Family: Not on file  . Frequency of Social Gatherings with Friends and Family: Not on file  . Attends Religious Services: Not on file  . Active Member of Clubs or Organizations: Not on file  . Attends Archivist Meetings: Not on file  . Marital Status: Not on file  Intimate Partner Violence:   . Fear of Current or Ex-Partner: Not on file  . Emotionally Abused: Not on file  . Physically Abused: Not on file  . Sexually Abused: Not on file    Past Medical History, Surgical history, Social history, and Family history were reviewed and updated as appropriate.   Please see review of systems for further details on the patient's review from today.   Review of Systems:  Review of Systems  Constitutional: Negative for chills, diaphoresis, fatigue and fever.  HENT: Negative for trouble swallowing and voice change.   Respiratory: Negative for cough, chest tightness, shortness of breath and wheezing.   Cardiovascular: Negative for chest pain and palpitations.  Gastrointestinal: Negative for abdominal pain, constipation, diarrhea, nausea and vomiting.  Musculoskeletal: Negative for arthralgias, back pain and myalgias.  Neurological: Positive for numbness. Negative for dizziness, light-headedness and headaches.    Objective:   Physical Exam:  BP 123/78 (BP Location: Right Arm, Patient Position: Sitting) Comment: retake notified nurse  Pulse 88   Temp 97.6 F (36.4 C) (Tympanic)   Resp 18   Ht 5\' 2"  (1.575 m)   Wt 199 lb 8 oz (90.5 kg)   LMP 01/20/2020 (Exact Date)   SpO2 100%    BMI 36.49 kg/m  ECOG: 0  Physical Exam Constitutional:      General: She is not in acute distress.    Appearance: She is not diaphoretic.  HENT:     Head: Normocephalic and atraumatic.  Eyes:     General: No scleral icterus.       Right eye: No discharge.        Left eye: No discharge.     Conjunctiva/sclera: Conjunctivae normal.  Cardiovascular:     Rate and Rhythm: Normal rate and regular rhythm.     Heart sounds: Normal heart sounds. No murmur heard.  No friction rub. No gallop.   Pulmonary:     Effort: Pulmonary effort is normal. No respiratory distress.     Breath sounds: Normal breath sounds. No wheezing or rales.  Abdominal:     General: Bowel sounds are normal. There is  no distension.     Tenderness: There is no abdominal tenderness. There is no guarding.  Musculoskeletal:     Right lower leg: No edema.     Left lower leg: No edema.  Skin:    General: Skin is warm and dry.     Findings: No erythema or rash.  Neurological:     Mental Status: She is alert.     Coordination: Coordination normal.     Gait: Gait normal.  Psychiatric:        Mood and Affect: Mood normal.        Behavior: Behavior normal.     Lab Review:     Component Value Date/Time   NA 139 05/28/2020 1323   NA 138 08/15/2017 1525   K 3.5 05/28/2020 1323   CL 105 05/28/2020 1323   CO2 28 05/28/2020 1323   GLUCOSE 92 05/28/2020 1323   BUN 10 05/28/2020 1323   BUN 12 08/15/2017 1525   CREATININE 0.74 05/28/2020 1323   CREATININE 0.77 02/26/2020 0915   CALCIUM 9.3 05/28/2020 1323   PROT 7.0 05/28/2020 1323   PROT 7.4 01/03/2017 1642   ALBUMIN 3.6 05/28/2020 1323   ALBUMIN 4.3 01/03/2017 1642   AST 20 05/28/2020 1323   AST 17 02/26/2020 0915   ALT 25 05/28/2020 1323   ALT 21 02/26/2020 0915   ALKPHOS 91 05/28/2020 1323   BILITOT 0.2 (L) 05/28/2020 1323   BILITOT <0.2 (L) 02/26/2020 0915   GFRNONAA >60 05/28/2020 1323   GFRNONAA >60 02/26/2020 0915   GFRAA >60 05/28/2020 1323   GFRAA  >60 02/26/2020 0915       Component Value Date/Time   WBC 3.8 (L) 05/28/2020 1323   RBC 3.22 (L) 05/28/2020 1323   HGB 9.6 (L) 05/28/2020 1323   HGB 12.0 02/26/2020 0915   HCT 29.8 (L) 05/28/2020 1323   PLT 250 05/28/2020 1323   PLT 158 02/26/2020 0915   MCV 92.5 05/28/2020 1323   MCV 80.9 01/03/2017 1651   MCH 29.8 05/28/2020 1323   MCHC 32.2 05/28/2020 1323   RDW 16.7 (H) 05/28/2020 1323   LYMPHSABS 1.3 05/28/2020 1323   MONOABS 0.4 05/28/2020 1323   EOSABS 0.0 05/28/2020 1323   BASOSABS 0.0 05/28/2020 1323   -------------------------------  Imaging from last 24 hours (if applicable):  Radiology interpretation: DG Abd 2 Views  Result Date: 04/29/2020 CLINICAL DATA:  Malignant neoplasm of upper-outer quadrant of left breast in female, estrogen receptor positive. Abdominal discomfort, gas. Additional history provided: Patient reports pain across upper and lower abdomen for 1.5-2 months. Pain is increasing. EXAM: ABDOMEN - 2 VIEW COMPARISON:  Radiographs of the abdomen 03/19/2020. FINDINGS: No dilated loops of small bowel are demonstrated to suggest small bowel obstruction. No evidence of free air on the upright radiograph. There is a moderate amount of stool within the right colon. No acute bony abnormality identified. Partially imaged central venous catheter terminating within the upper right atrium. Tubal ligation clips within the pelvis. IMPRESSION: No radiographic evidence of small-bowel obstruction. Moderate amount of stool within the right colon. Electronically Signed   By: Kellie Simmering DO   On: 04/29/2020 14:49      OK to treat.  Sandi Mealy, MHS, PA-C

## 2020-06-02 NOTE — Progress Notes (Signed)
[place]  [phone]) [phone] Fax:(336) [phone]     ID: [name] DOB: 1976-06-23  MR#: [id number]  YSA#:[id number]  Patient Care Team: Rutherford Guys, MD as PCP - General (Family Medicine) Rolm Bookbinder, MD as Consulting Physician (General Surgery) Magrinat, Virgie Dad, MD as Consulting Physician (Oncology) Eppie Gibson, MD as Attending Physician (Radiation Oncology) Rockwell Germany, RN as Oncology Nurse Navigator Mauro Kaufmann, RN as Oncology Nurse Navigator Scot Dock, NP OTHER MD:  CHIEF COMPLAINT: Triple negative breast cancer  CURRENT TREATMENT: Neoadjuvant chemotherapy   INTERVAL HISTORY:  [name] returns today for follow up and treatment of her triple negative breast cancer.  She is accompanied by a [place] interpreter.  She is now receiving weekly Paclitaxel and Carboplatin. Today is week 9 of 12 planned. She has numbness on the soles of her feet, starting from the toes to the heel. She says it has been going on for about 2 weeks.  She was prescribed Gabapentin last week and it is not helping.  She denies any balance changes. She notes her fingertips are numb constantly, but very mild.  She denies any motor changes in the fingertips as well.   REVIEW OF SYSTEMS: [name] is doing moderately well.  She notes periorbital edema x 1 week, and occasional twitching.  She has also had some discharge from the eye.  She denies any vision changes.  [name] denies any fever or chills.  She has no cough, shortness of breath, bowel/bladder changes, headaches, vision issues, or any other concerns.  A detailed ROS was otherwise non contributory.     HISTORY OF CURRENT ILLNESS: From the original intake note:  [name] herself palpated a left beast lump with associated pain. She underwent bilateral diagnostic mammography with tomography and left breast ultrasonography at [facility] on 01/23/2020 showing: breast density category B; palpable  3.6 cm left breast mass with associated calcifications at 1 o'clock; 0.8 cm left breast mass at 2 o'clock, concerning for abnormal intramammary lymph node; at least two abnormal left axillary lymph nodes.  Accordingly on 01/23/2020 she proceeded to biopsy of the left breast area in question. The pathology from this procedure ([id number]) showed: invasive ductal carcinoma at 1 o'clock, grade 3. Prognostic indicators significant for: estrogen receptor, 20% positive with moderate staining intensity and progesterone receptor, 0% negative. Proliferation marker Ki67 at 40%. HER2 negative by immunohistochemistry (1+).  The left axillary lymph node confirmed metastatic carcinoma.  The left intramammary lymph node was negative for carcinoma.  The patient's subsequent history is as detailed below.   PAST MEDICAL HISTORY: Past Medical History:  Diagnosis Date  . Allergy   . Asthma   . Chronic back pain   . Hx of migraines   . Medical history non-contributory     PAST SURGICAL HISTORY: Past Surgical History:  Procedure Laterality Date  . CESAREAN SECTION WITH BILATERAL TUBAL LIGATION Bilateral 07/23/2015   Procedure: CESAREAN SECTION WITH BILATERAL TUBAL LIGATION;  Surgeon: Donnamae Jude, MD;  Location: [facility];  Service: Obstetrics;  Laterality: Bilateral;  . PORTACATH PLACEMENT Right 02/11/2020   Procedure: INSERTION PORT-A-CATH WITH ULTRASOUND GUIDANCE;  Surgeon: Rolm Bookbinder, MD;  Location: [facility];  Service: General;  Laterality: Right;  . TUBAL LIGATION      FAMILY HISTORY: No family history on file.  Her parents are both living as of 01/2020, her father age 52 and her mother age 74. She has three maternal half-siblings (1 sister, 2  brothers) and four paternal half-siblings (1 sister, 3 brothers). There is no family history of cancer to her knowledge.   GYNECOLOGIC HISTORY:  Patient's last menstrual period was 01/20/2020 (exact date). Menarche: 44 years old Age at  first live birth: 44 years old [place] P 3 LMP 01/2020 Contraceptive s/p tubal ligation HRT n/a  Hysterectomy? no BSO? 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 Husband Vaughan Sine works for a Financial trader, both in [employer]. They are both from [place]. She lives at home with [place] and their three children-- [name], [place], and [name]. She attends an [employer].    ADVANCED DIRECTIVES: In the absence of any documentation to the contrary, the patient's spouse is their HCPOA.    HEALTH MAINTENANCE: Social History   Tobacco Use  . Smoking status: Never Smoker  . Smokeless tobacco: Never Used  Vaping Use  . Vaping Use: Never used  Substance Use Topics  . Alcohol use: No  . Drug use: No     Colonoscopy: n/a (age)  PAP: 11/2017, negative (per patient)  Bone density: n/a (age)   Allergies  Allergen Reactions  . Doxorubicin Hcl Rash    Experience chest tightness, abdominal pain, and redness around lips during and shortly after doxorubicin bolus.     Current Outpatient Medications  Medication Sig Dispense Refill  . albuterol (VENTOLIN HFA) 108 (90 Base) MCG/ACT inhaler Inhale into the lungs every 6 (six) hours as needed for wheezing or shortness of breath.    Marland Kitchen azelastine (ASTELIN) 0.1 % nasal spray Place 2 sprays into both nostrils 2 (two) times daily. Use in each nostril as directed 30 mL 12  . dexamethasone (DECADRON) 4 MG tablet Take 1 tablets (8 mg) by mouth daily with breakfast starting the day after chemotherapy x 2 days 30 tablet 1  . fluticasone (FLONASE) 50 MCG/ACT nasal spray Place 2 sprays into both nostrils daily. 16 g 2  . gabapentin (NEURONTIN) 300 MG capsule Take 1 capsule (300 mg total) by mouth at bedtime. 30 capsule 3  . lidocaine-prilocaine (EMLA) cream Apply to affected area once 30 g 3  . LORazepam (ATIVAN) 0.5 MG tablet Take 1 tablet at bedtime on  days 1 ,2 and 3 of each chemotherapy treatment, then at bedtime as needed 30 tablet 0  . magic mouthwash SOLN Take 5 mLs by mouth 3 (three) times daily as needed for mouth pain. 240 mL 0  . pantoprazole (PROTONIX) 40 MG tablet Take 1 tablet (40 mg total) by mouth 2 (two) times daily. 60 tablet 1  . sodium chloride (OCEAN) 0.65 % SOLN nasal spray Place 2 sprays into both nostrils as needed for congestion. 30 mL 0  . valACYclovir (VALTREX) 500 MG tablet Take 1 tablet (500 mg total) by mouth 2 (two) times daily. 60 tablet 1  . prochlorperazine (COMPAZINE) 10 MG tablet Take 1 tablet (10 mg total) by mouth 3 (three) times daily before meals for 3 days. 30 tablet 1   No current facility-administered medications for this visit.    OBJECTIVE: Spanish speaker in no acute distress  Vitals:   06/03/20 1037  BP: 108/74  Pulse: 98  Resp: 17  Temp: (!) 97 F (36.1 C)  SpO2: 100%     Body mass index is 36.43 kg/m.   Wt Readings from Last 3 Encounters:  06/03/20 199 lb 3.2 oz (90.4 kg)  05/28/20 199 lb 8 oz (  90.5 kg)  05/20/20 198 lb 1.6 oz (89.9 kg)      ECOG FS:1 - Symptomatic but completely ambulatory GENERAL: Patient is a well appearing female in no acute distress HEENT:  Sclerae anicteric.  Mask in place. Neck is supple.  NODES:  No cervical, supraclavicular, or axillary lymphadenopathy palpated.  BREAST EXAM:  Difficulty palpating a mass, ? Soft mass in left upper outer breast LUNGS:  Clear to auscultation bilaterally.  No wheezes or rhonchi. HEART:  Regular rate and rhythm. No murmur appreciated. ABDOMEN:  Soft, nontender.  Positive, normoactive bowel sounds. No organomegaly palpated. MSK:  No focal spinal tenderness to palpation. Full range of motion bilaterally in the upper extremities. EXTREMITIES:  No peripheral edema.   SKIN:  Clear with no obvious rashes or skin changes. No nail dyscrasia. NEURO:  Nonfocal. Well oriented.  Appropriate affect.    LAB RESULTS:  CMP       Component Value Date/Time   NA 140 06/03/2020 0949   NA 138 08/15/2017 1525   K 3.4 (L) 06/03/2020 0949   CL 104 06/03/2020 0949   CO2 30 06/03/2020 0949   GLUCOSE 108 (H) 06/03/2020 0949   BUN 12 06/03/2020 0949   BUN 12 08/15/2017 1525   CREATININE 0.74 06/03/2020 0949   CREATININE 0.77 02/26/2020 0915   CALCIUM 9.5 06/03/2020 0949   PROT 6.8 06/03/2020 0949   PROT 7.4 01/03/2017 1642   ALBUMIN 3.5 06/03/2020 0949   ALBUMIN 4.3 01/03/2017 1642   AST 20 06/03/2020 0949   AST 17 02/26/2020 0915   ALT 28 06/03/2020 0949   ALT 21 02/26/2020 0915   ALKPHOS 84 06/03/2020 0949   BILITOT 0.3 06/03/2020 0949   BILITOT <0.2 (L) 02/26/2020 0915   GFRNONAA >60 06/03/2020 0949   GFRNONAA >60 02/26/2020 0915   GFRAA >60 06/03/2020 0949   GFRAA >60 02/26/2020 0915    No results found for: TOTALPROTELP, ALBUMINELP, A1GS, A2GS, BETS, BETA2SER, GAMS, MSPIKE, SPEI  Lab Results  Component Value Date   WBC 2.9 (L) 06/03/2020   NEUTROABS 1.8 06/03/2020   HGB 9.3 (L) 06/03/2020   HCT 27.8 (L) 06/03/2020   MCV 91.1 06/03/2020   PLT 207 06/03/2020    No results found for: LABCA2  No components found for: ZJQBHA193  No results for input(s): INR in the last 168 hours.  No results found for: LABCA2  No results found for: XTK240  No results found for: XBD532  No results found for: DJM426  No results found for: CA2729  No components found for: HGQUANT  No results found for: CEA1 / No results found for: CEA1   No results found for: AFPTUMOR  No results found for: CHROMOGRNA  No results found for: KPAFRELGTCHN, LAMBDASER, KAPLAMBRATIO (kappa/lambda light chains)  No results found for: HGBA, HGBA2QUANT, HGBFQUANT, HGBSQUAN (Hemoglobinopathy evaluation)   No results found for: LDH  No results found for: IRON, TIBC, IRONPCTSAT (Iron and TIBC)  No results found for: FERRITIN  Urinalysis No results found for: COLORURINE, APPEARANCEUR, LABSPEC, PHURINE, GLUCOSEU, HGBUR,  BILIRUBINUR, KETONESUR, PROTEINUR, UROBILINOGEN, NITRITE, LEUKOCYTESUR   STUDIES: No results found.   ELIGIBLE FOR AVAILABLE RESEARCH PROTOCOL: [phone]?  ASSESSMENT: 44 y.o. 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  (a) chest CT scan and bone scan 02/07/2020 showed no evidence of metastatic disease  (1) neoadjuvant chemotherapy will consist of  doxorubicin and cyclophosphamide in dose dense fashion x4 starting 02/12/2020 to be followed by paclitaxel and carboplatin weekly x12  (a) echo 02/07/2020 shows an ejection fraction in the 55-60% range  (b) Epirubicin substituted for Doxorubicin secondary to allergic rash from Doxorubicin  (2) definitive surgery to follow  (3) adjuvant radiation  (4) genetics testing 02/07/2020 through the [facility] Common Hereditary Cancers Panel found no deleterious mutations in  APC, ATM, AXIN2, BARD1, BMPR1A, BRCA1, BRCA2, BRIP1, CDH1, CDKN2A (p14ARF), CDKN2A (p16INK4a), CKD4, CHEK2, CTNNA1, DICER1, EPCAM (Deletion/duplication testing only), GREM1 (promoter region deletion/duplication testing only), KIT, MEN1, MLH1, MSH2, MSH3, MSH6, MUTYH, NBN, NF1, NHTL1, PALB2, PDGFRA, PMS2, POLD1, POLE, PTEN, RAD50, RAD51C, RAD51D, RNF43, SDHB, SDHC, SDHD, SMAD4, SMARCA4. STK11, TP53, TSC1, TSC2, and VHL.  The following genes were evaluated for sequence changes only: SDHA and HOXB13 c.251G>A variant only.  (a) VUS in MSH6 called c.2744C>G identified.     PLAN: Amrie continues on treatment with weekly Paclitaxel and Carboplatin.  Her labs are stable.  She has unfortunately developed peripheral neuropathy.  She met with Dr. Jana Hakim today to review this in detail and she will stop chemotherapy.    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  Will send in eye gtts for her.    She will undergo breast MRI  and get f/u with Dr. Donne Hazel.  I have notified our breast navigators.    We will see Oliviya back ina bout 8 weeks for f/u.  She knows to call for any questions that may arise between now and her next appointment.  We are happy to see her sooner if needed.  Total encounter time 20 minutes.Wilber Bihari, NP 06/03/20 11:03 AM Medical Oncology and Hematology [facility] [place], [place] [address] Tel. [phone]    Fax. [phone]     *Total Encounter Time as defined by the Centers for Medicare and Medicaid Services includes, in addition to the face-to-face time of a patient visit (documented in the note above) non-face-to-face time: obtaining and reviewing outside history, ordering and reviewing medications, tests or procedures, care coordination (communications with other health care professionals or caregivers) and documentation in the medical record.

## 2020-06-03 ENCOUNTER — Inpatient Hospital Stay: Payer: Self-pay

## 2020-06-03 ENCOUNTER — Encounter: Payer: Self-pay | Admitting: *Deleted

## 2020-06-03 ENCOUNTER — Encounter: Payer: Self-pay | Admitting: Pharmacy Technician

## 2020-06-03 ENCOUNTER — Encounter: Payer: Self-pay | Admitting: Adult Health

## 2020-06-03 ENCOUNTER — Inpatient Hospital Stay (HOSPITAL_BASED_OUTPATIENT_CLINIC_OR_DEPARTMENT_OTHER): Payer: Self-pay | Admitting: Adult Health

## 2020-06-03 ENCOUNTER — Other Ambulatory Visit: Payer: Self-pay

## 2020-06-03 ENCOUNTER — Telehealth: Payer: Self-pay | Admitting: Oncology

## 2020-06-03 VITALS — BP 108/74 | HR 98 | Temp 97.0°F | Resp 17 | Ht 62.0 in | Wt 199.2 lb

## 2020-06-03 DIAGNOSIS — C50412 Malignant neoplasm of upper-outer quadrant of left female breast: Secondary | ICD-10-CM

## 2020-06-03 DIAGNOSIS — Z95828 Presence of other vascular implants and grafts: Secondary | ICD-10-CM

## 2020-06-03 DIAGNOSIS — Z170 Estrogen receptor positive status [ER+]: Secondary | ICD-10-CM

## 2020-06-03 LAB — CBC WITH DIFFERENTIAL/PLATELET
Abs Immature Granulocytes: 0.06 10*3/uL (ref 0.00–0.07)
Basophils Absolute: 0 10*3/uL (ref 0.0–0.1)
Basophils Relative: 1 %
Eosinophils Absolute: 0 10*3/uL (ref 0.0–0.5)
Eosinophils Relative: 0 %
HCT: 27.8 % — ABNORMAL LOW (ref 36.0–46.0)
Hemoglobin: 9.3 g/dL — ABNORMAL LOW (ref 12.0–15.0)
Immature Granulocytes: 2 %
Lymphocytes Relative: 29 %
Lymphs Abs: 0.8 10*3/uL (ref 0.7–4.0)
MCH: 30.5 pg (ref 26.0–34.0)
MCHC: 33.5 g/dL (ref 30.0–36.0)
MCV: 91.1 fL (ref 80.0–100.0)
Monocytes Absolute: 0.2 10*3/uL (ref 0.1–1.0)
Monocytes Relative: 8 %
Neutro Abs: 1.8 10*3/uL (ref 1.7–7.7)
Neutrophils Relative %: 60 %
Platelets: 207 10*3/uL (ref 150–400)
RBC: 3.05 MIL/uL — ABNORMAL LOW (ref 3.87–5.11)
RDW: 16.3 % — ABNORMAL HIGH (ref 11.5–15.5)
WBC: 2.9 10*3/uL — ABNORMAL LOW (ref 4.0–10.5)
nRBC: 0 % (ref 0.0–0.2)

## 2020-06-03 LAB — COMPREHENSIVE METABOLIC PANEL
ALT: 28 U/L (ref 0–44)
AST: 20 U/L (ref 15–41)
Albumin: 3.5 g/dL (ref 3.5–5.0)
Alkaline Phosphatase: 84 U/L (ref 38–126)
Anion gap: 6 (ref 5–15)
BUN: 12 mg/dL (ref 6–20)
CO2: 30 mmol/L (ref 22–32)
Calcium: 9.5 mg/dL (ref 8.9–10.3)
Chloride: 104 mmol/L (ref 98–111)
Creatinine, Ser: 0.74 mg/dL (ref 0.44–1.00)
GFR calc Af Amer: >60 mL/min (ref >60)
GFR calc non Af Amer: >60 mL/min (ref >60)
Glucose, Bld: 108 mg/dL — ABNORMAL HIGH (ref 70–99)
Potassium: 3.4 mmol/L — ABNORMAL LOW (ref 3.5–5.1)
Sodium: 140 mmol/L (ref 135–145)
Total Bilirubin: 0.3 mg/dL (ref 0.3–1.2)
Total Protein: 6.8 g/dL (ref 6.5–8.1)

## 2020-06-03 MED ORDER — SODIUM CHLORIDE 0.9% FLUSH
10.0000 mL | Freq: Once | INTRAVENOUS | Status: AC
  Administered 2020-06-03: 10 mL
  Filled 2020-06-03: qty 10

## 2020-06-03 MED ORDER — TOBRAMYCIN-DEXAMETHASONE 0.3-0.1 % OP SUSP: 1.0000 [drp] | Freq: Four times a day (QID) | OPHTHALMIC | 0 refills | Status: DC

## 2020-06-03 MED ORDER — HEPARIN SOD (PORK) LOCK FLUSH 100 UNIT/ML IV SOLN
500.0000 [IU] | Freq: Once | INTRAVENOUS | Status: AC
  Administered 2020-06-03: 500 [IU]
  Filled 2020-06-03: qty 5

## 2020-06-03 NOTE — Patient Instructions (Signed)
Gua de cuidados en el hogar del dispositivo de perfusin implantable Implanted [employer] Un dispositivo de perfusin implantable es un dispositivo que se coloca debajo de la piel. Por lo general, se coloca en el pecho. Puede usarse para suministrar medicamentos intravenosos, tomar muestras de sangre o Optometrist dilisis. Puede tener un dispositivo implantable si:  Necesita administrarse un medicamento intravenoso que podra provocar una irritacin en las venas pequeas de las manos o los brazos.  Necesita medicamentos por va intravenosa a largo plazo, como antibiticos.  Necesita recibir nutricin por va intravenosa durante un largo perodo.  Debe someterse a dilisis. Si tiene un dispositivo, su mdico no tendr que recurrir a las venas de sus brazos para estos procedimientos. Puede tener algunas limitaciones ms al usar el dispositivo que si usara otro tipo de vas intravenosas a Barrister's clerk, y es probable que pueda retomar sus actividades normales cuando cure la incisin. Un dispositivo de perfusin implantable tiene dos partes principales:  Depsito. El depsito es la parte en donde se inserta la aguja para Architectural technologist los medicamentos o Merchant navy officer. El depsito es redondo. Luego de colocado, se ve como un rea pequea y elevada debajo de su piel.  Catter. El catter es un tubo delgado y flexible que conecta el depsito a una vena. Los medicamentos que se introducen en el depsito, pasan a travs del catter y luego llegan a la vena. Cmo se accede al dispositivo de perfusin? Para acceder al dispositivo:  Puede que se le coloque crema anestsica sobre la piel encima del dispositivo.  El mdico se colocar Judene Companion y guantes estriles.  La piel sobre el dispositivo se limpiar con cuidado con un jabn antisptico y se Psychiatric nurse.  Su mdico pellizcar suavemente el dispositivo e insertar una aguja dentro.  Su mdico revisar el retorno de sangre para garantizar que  el dispositivo est en la vena y no est obstruido.  Si el dispositivo debe tener accesibilidad para un suministro continuo de medicamentos (infusin constante), su mdico colocar un vendaje (venda) claro Dance movement psychotherapist donde se coloc la aguja. El vendaje y la aguja debern cambiarse todas las semanas o segn las indicaciones del mdico. Qu es el purgado? El purgado ayuda a que el dispositivo no se [place]. Siga las indicaciones del mdico acerca de cmo y cundo purgar el dispositivo. Los dispositivos de perfusin generalmente se purgan con una solucin salina o un medicamento llamado heparina. La necesidad de Corporate treasurer de cmo se use el dispositivo:  Si se [place] solo eventualmente para suministrar medicamentos o Merchant navy officer, se debe purgar el dispositivo: ? Antes y despus de la administracin de los medicamentos. ? Antes y despus de [place] extraccin de [place]. ? Como parte de una rutina de Theatre manager. Es recomendable que se purgue cada 4 o 6 semanas.  Si la infusin es constante, no necesitar limpiarlo.  Deseche las agujas y las jeringas en un contenedor para desechos destinado a objetos punzantes(recipiente para objetos punzantes). Puede comprar un recipiente para objetos punzantes en [place], o puede hacer uno usted mismo con una botella de plstico rgida y Risk manager. Durante cunto tiempo tendr [employer] dispositivo? El dispositivo puede permanecer implantado por el tiempo que el mdico considere necesario. Cuando llega el momento de retirar el dispositivo, deber someterse a [place]. Esta ciruga ser similar a la Teacher, adult education de la colocacin del dispositivo. Siga estas indicaciones en su casa:   Purgue el dispositivo como se lo haya indicado el  mdico.  Si debe recibir una infusin [place] indicaciones del mdico acerca de cmo cuidar el lugar donde tiene colocado el dispositivo. Haga lo siguiente: ? Lvese las manos  con agua y [place] antes de cambiar el apsito. Use desinfectante para manos con alcohol si no dispone de [place] y [place]. ? Cambie el vendaje como se lo haya indicado el mdico. ? Coloque vendas usadas o bolsas de infusin en una bolsa plstica. Tire esa bolsa en el contenedor de basura. ? Mantenga el vendaje que cubre la aguja limpio y seco. No deje que se moje. ? No use tijeras u objetos punzantes cerca del tubo. ? Mantenga el tubo sujetado, excepto cuando se use.  Controle TEFL teacher donde tiene colocado el dispositivo todos los das para detectar signos de infeccin. Est atento a los siguientes signos: ? Dolor, hinchazn o enrojecimiento. ? Lquido o sangre. ? Pus o mal olor.  Proteja la piel alrededor del lugar donde tiene colocado el dispositivo. ? Evite usar sostenes si los breteles rozan o Loss adjuster, chartered. ? Proteja la piel alrededor del dispositivo cuando se coloque el cinturn de seguridad. Coloque una almohadilla suave en el pecho, de ser necesario.  Dchese o bese como se lo haya indicado el mdico. Se puede mojar el lugar siempre y cuando no est recibiendo activamente una infusin.  Retome sus actividades normales como se lo haya indicado el mdico. Pregntele al mdico qu actividades son seguras para usted.  Utilice un brazalete o lleve consigo una tarjeta de alerta mdica en todo momento. Esto les permitir a los mdicos saber que tiene un dispositivo implantable en caso de Engineer, maintenance (IT). Solicite ayuda de inmediato si:  Tiene enrojecimiento, hinchazn o Management consultant donde tiene el dispositivo.  Le sale una mayor cantidad de lquido o de sangre del lugar donde tiene el dispositivo.  Tiene pus o percibe mal olor que proviene del lugar donde tiene colocado el dispositivo.  Tiene fiebre. Resumen  Por lo general, los dispositivos implantados se colocan en el pecho para un acceso intravenoso a Barrister's clerk.  Siga las indicaciones del mdico sobre cmo purgar el  dispositivo y [place] vendas (vendajes).  Cuide el rea alrededor de donde tiene colocado el dispositivo, evite la ropa que presione el rea, y preste atencin a los signos de infeccin.  Proteja la piel alrededor del dispositivo cuando se coloque el cinturn de seguridad. Coloque una almohadilla suave en el pecho, de ser necesario.  Busque ayuda de inmediato si tiene fiebre o enrojecimiento, hinchazn, dolor, drenaje o mal Editor, commissioning donde tiene el dispositivo. Esta informacin no tiene Marine scientist el consejo del mdico. Asegrese de hacerle al mdico cualquier pregunta que tenga. Document Revised: 06/01/2017 Document Reviewed: 06/01/2017 Elsevier Patient Education  2020 [employer].

## 2020-06-03 NOTE — Telephone Encounter (Signed)
Scheduled appts per 9/22 los. Gave pt a print out of appt calendar.

## 2020-06-03 NOTE — Progress Notes (Signed)
Patient has been approved for drug assistance by [employer] for [employer]. The enrollment period is from 05/08/20-09/11/20 based on self pay.

## 2020-06-04 ENCOUNTER — Telehealth: Payer: Self-pay | Admitting: *Deleted

## 2020-06-04 ENCOUNTER — Encounter: Payer: Self-pay | Admitting: *Deleted

## 2020-06-04 NOTE — Telephone Encounter (Signed)
Spoke with patient through interpreter services to inform her of an appointment with Dr. Donne Hazel 10/4 at 1015am and confirm her appointment for her MRI on 9/30. Patient verbalized understanding.

## 2020-06-11 ENCOUNTER — Other Ambulatory Visit: Payer: Self-pay

## 2020-06-11 ENCOUNTER — Ambulatory Visit (HOSPITAL_COMMUNITY)
Admission: RE | Admit: 2020-06-11 | Discharge: 2020-06-11 | Disposition: A | Discharge status: Home / Self Care | Payer: Self-pay | Source: Ambulatory Visit | Attending: Adult Health | Admitting: Adult Health

## 2020-06-11 DIAGNOSIS — Z170 Estrogen receptor positive status [ER+]: Secondary | ICD-10-CM | POA: Insufficient documentation

## 2020-06-11 DIAGNOSIS — C50412 Malignant neoplasm of upper-outer quadrant of left female breast: Secondary | ICD-10-CM | POA: Insufficient documentation

## 2020-06-11 MED ORDER — GADOBUTROL 1 MMOL/ML IV SOLN
9.0000 mL | Freq: Once | INTRAVENOUS | Status: AC | PRN
  Administered 2020-06-11: 9 mL via INTRAVENOUS

## 2020-06-12 ENCOUNTER — Encounter: Payer: Self-pay | Admitting: *Deleted

## 2020-06-15 ENCOUNTER — Other Ambulatory Visit: Payer: Self-pay | Admitting: General Surgery

## 2020-06-15 DIAGNOSIS — C50912 Malignant neoplasm of unspecified site of left female breast: Secondary | ICD-10-CM

## 2020-06-15 DIAGNOSIS — Z170 Estrogen receptor positive status [ER+]: Secondary | ICD-10-CM

## 2020-06-18 ENCOUNTER — Encounter (HOSPITAL_BASED_OUTPATIENT_CLINIC_OR_DEPARTMENT_OTHER): Payer: Self-pay | Admitting: General Surgery

## 2020-06-18 ENCOUNTER — Other Ambulatory Visit: Payer: Self-pay

## 2020-06-18 ENCOUNTER — Other Ambulatory Visit: Payer: Self-pay | Admitting: General Surgery

## 2020-06-18 ENCOUNTER — Encounter: Payer: Self-pay | Admitting: *Deleted

## 2020-06-18 DIAGNOSIS — C50412 Malignant neoplasm of upper-outer quadrant of left female breast: Secondary | ICD-10-CM

## 2020-06-19 ENCOUNTER — Encounter: Payer: Self-pay | Admitting: *Deleted

## 2020-06-19 DIAGNOSIS — Z170 Estrogen receptor positive status [ER+]: Secondary | ICD-10-CM

## 2020-06-20 ENCOUNTER — Other Ambulatory Visit (HOSPITAL_COMMUNITY)
Admission: RE | Admit: 2020-06-20 | Discharge: 2020-06-20 | Disposition: A | Discharge status: Home / Self Care | Payer: HRSA Program | Source: Ambulatory Visit | Attending: General Surgery | Admitting: General Surgery

## 2020-06-20 DIAGNOSIS — Z01812 Encounter for preprocedural laboratory examination: Secondary | ICD-10-CM | POA: Diagnosis present

## 2020-06-20 DIAGNOSIS — U071 COVID-19: Secondary | ICD-10-CM | POA: Insufficient documentation

## 2020-06-20 LAB — SARS CORONAVIRUS 2 (TAT 6-24 HRS): SARS Coronavirus 2: POSITIVE — AB

## 2020-06-21 ENCOUNTER — Telehealth: Payer: Self-pay | Admitting: Physician Assistant

## 2020-06-21 ENCOUNTER — Encounter: Payer: Self-pay | Admitting: Physician Assistant

## 2020-06-21 DIAGNOSIS — Z853 Personal history of malignant neoplasm of breast: Secondary | ICD-10-CM | POA: Insufficient documentation

## 2020-06-21 NOTE — Progress Notes (Signed)
Notified Dr. Bobbye Morton, on call for general surgery, of covid test results.  She stated she would pass the information on to Dr. Donne Hazel.

## 2020-06-21 NOTE — Telephone Encounter (Signed)
Called to discuss with Astrid Drafts Perdomo about Covid symptoms and the use of casirivimab/imdevimab or bamlanivimab/etesevimab infusion for those with mild to moderate Covid symptoms and at a high risk of hospitalization.    Pt is qualified for this infusion at the monoclonal antibody infusion center due to co-morbid conditions and/or a member of an at-risk group (breast cancer, BMI>25, high SVI), however would like to think more about the infusion at this time. Symptoms tier reviewed as well as criteria for ending isolation.  Symptoms reviewed that would warrant ED/Hospital evaluation. Preventative practices reviewed. Patient verbalized understanding. Patient advised to call back if he decides that he does want to get infusion. Callback number to the infusion center given. Patient advised to go to Urgent care or ED with severe symptoms. Last date pt would be eligible for infusion is 10/16.   Pt requires a spanish interpreter.   Patient Active Problem List   Diagnosis Date Noted  . Morbid obesity ([place])   . History of breast cancer   . Port-A-Cath in place 03/25/2020  . Genetic testing 02/13/2020  . Malignant neoplasm of upper-outer quadrant of left breast in female, estrogen receptor positive ([place]) 01/27/2020  . SOB (shortness of breath) 01/10/2017  . Pregnancy 07/23/2015  . Previous cesarean delivery, antepartum condition or complication 97/35/3299  . Encounter for sterilization 07/08/2015  . Advanced maternal age in multigravida   . Impaired ability to use community resources due to language barrier 07/13/2013    Angelena Form PA-C

## 2020-06-22 ENCOUNTER — Other Ambulatory Visit: Payer: Self-pay | Admitting: Oncology

## 2020-06-22 ENCOUNTER — Other Ambulatory Visit: Payer: Self-pay | Admitting: Physician Assistant

## 2020-06-22 DIAGNOSIS — U071 COVID-19: Secondary | ICD-10-CM

## 2020-06-22 DIAGNOSIS — C50412 Malignant neoplasm of upper-outer quadrant of left female breast: Secondary | ICD-10-CM

## 2020-06-22 DIAGNOSIS — Z170 Estrogen receptor positive status [ER+]: Secondary | ICD-10-CM

## 2020-06-22 NOTE — Progress Notes (Signed)
I connected by phone with [name] on 06/22/2020 at 9:23 PM to discuss the potential use of a new treatment for mild to moderate COVID-19 viral infection in non-hospitalized patients.  This patient is a 44 y.o. female that meets the FDA criteria for Emergency Use Authorization of COVID monoclonal antibody bamlamivimab/estevimab.Marland Kitchen  Has a (+) direct SARS-CoV-2 viral test result  Has mild or moderate COVID-19   Is NOT hospitalized due to COVID-19  Is within 10 days of symptom onset  Has at least one of the high risk factor(s) for progression to severe COVID-19 and/or hospitalization as defined in EUA.  Specific high risk criteria : BMI > 25, Immunosuppressive Disease or Treatment and Other high risk medical condition per CDC:  socially vulnerable population    I have spoken and communicated the following to the patient or parent/caregiver regarding COVID monoclonal antibody treatment:  1. FDA has authorized the emergency use for the treatment of mild to moderate COVID-19 in adults and pediatric patients with positive results of direct SARS-CoV-2 viral testing who are 42 years of age and older weighing at least 40 kg, and who are at high risk for progressing to severe COVID-19 and/or hospitalization.  2. The significant known and potential risks and benefits of COVID monoclonal antibody, and the extent to which such potential risks and benefits are unknown.  3. Information on available alternative treatments and the risks and benefits of those alternatives, including clinical trials.  4. Patients treated with COVID monoclonal antibody should continue to self-isolate and use infection control measures (e.g., wear mask, isolate, social distance, avoid sharing personal items, clean and disinfect "high touch" surfaces, and frequent handwashing) according to CDC guidelines.   5. The patient or parent/caregiver has the option to accept or refuse COVID monoclonal antibody treatment.  After  reviewing this information with the patient, the patient has agreed to receive one of the available covid 19 monoclonal antibodies and will be provided an appropriate fact sheet prior to infusion.  Sx onset 10/6. Set up for infusion on 10/12 @ 10:30am. Directions given to [facility]. Pt is aware that insurance will be charged an infusion fee.   Angelena Form 06/22/2020 9:23 PM

## 2020-06-23 ENCOUNTER — Ambulatory Visit (HOSPITAL_COMMUNITY): Payer: Self-pay

## 2020-06-23 ENCOUNTER — Ambulatory Visit (HOSPITAL_COMMUNITY)
Admission: RE | Admit: 2020-06-23 | Discharge: 2020-06-23 | Disposition: A | Discharge status: Home / Self Care | Payer: Self-pay | Source: Ambulatory Visit | Attending: Pulmonary Disease | Admitting: Pulmonary Disease

## 2020-06-23 DIAGNOSIS — U071 COVID-19: Secondary | ICD-10-CM | POA: Insufficient documentation

## 2020-06-23 DIAGNOSIS — C50412 Malignant neoplasm of upper-outer quadrant of left female breast: Secondary | ICD-10-CM | POA: Insufficient documentation

## 2020-06-23 DIAGNOSIS — Z170 Estrogen receptor positive status [ER+]: Secondary | ICD-10-CM | POA: Insufficient documentation

## 2020-06-23 MED ORDER — FAMOTIDINE IN NACL 20-0.9 MG/50ML-% IV SOLN: 20.0000 mg | Freq: Once | INTRAVENOUS | Status: DC | PRN

## 2020-06-23 MED ORDER — SODIUM CHLORIDE 0.9 % IV SOLN
Freq: Once | INTRAVENOUS | Status: AC
  Filled 2020-06-23: qty 20

## 2020-06-23 MED ORDER — SODIUM CHLORIDE 0.9 % IV SOLN: INTRAVENOUS | Status: DC | PRN

## 2020-06-23 MED ORDER — EPINEPHRINE 0.3 MG/0.3ML IJ SOAJ: 0.3000 mg | Freq: Once | INTRAMUSCULAR | Status: DC | PRN

## 2020-06-23 MED ORDER — DIPHENHYDRAMINE HCL 50 MG/ML IJ SOLN: 50.0000 mg | Freq: Once | INTRAMUSCULAR | Status: DC | PRN

## 2020-06-23 MED ORDER — ALBUTEROL SULFATE HFA 108 (90 BASE) MCG/ACT IN AERS: 2.0000 | INHALATION_SPRAY | Freq: Once | RESPIRATORY_TRACT | Status: DC | PRN

## 2020-06-23 MED ORDER — METHYLPREDNISOLONE SODIUM SUCC 125 MG IJ SOLR: 125.0000 mg | Freq: Once | INTRAMUSCULAR | Status: DC | PRN

## 2020-06-23 NOTE — Progress Notes (Signed)
  Diagnosis: COVID-19  Physician:dr wright  Procedure: Covid Infusion Clinic Med: bamlanivimab\etesevimab infusion - Provided patient with bamlanimivab\etesevimab fact sheet for patients, parents and caregivers prior to infusion.  Complications: No immediate complications noted.  Discharge: Discharged home   [place] 06/23/2020

## 2020-06-23 NOTE — Discharge Instructions (Signed)
Preguntas frecuentes sobre el COVID-19 COVID-19 Frequently Asked Questions El COVID-19 (enfermedad por coronavirus) es una infeccin causada por una gran familia de virus. Algunos virus causan [employer] y otros causan enfermedades en animales tales como los camellos, los gatos y los murcilagos. En algunos casos, los virus que causan [employer] pueden transmitirse a los seres humanos. De dnde provino el coronavirus? En diciembre de 2019, [place] le inform a Advertising account executive (Organizacin Mundial de la [place], Medical laboratory scientific officer) acerca de varios casos de enfermedad pulmonar (enfermedad respiratoria humana). Estos casos estaban vinculados con un mercado abierto de frutos de mar y [place] en [place]. El vnculo con el mercado de ganado y [employer] sugiere que el virus puede haberse propagado de los animales a los [place]. Sin embargo, desde Engineer, materials brote en diciembre, tambin se ha demostrado que el virus se contagia de [place] persona a Theatre manager. Cul es el nombre de la enfermedad y del virus? Nombre de la enfermedad Al principio, esta enfermedad se llam nuevo coronavirus. Esto se debe a que los cientficos determinaron que la enfermedad era causada por un nuevo virus respiratorio. [employer] (Organizacin Mundial de la [place], [place]) ahora ha dado a la enfermedad el nombre de COVID-19, o enfermedad por coronavirus. Nombre del virus El virus causante de la enfermedad se conoce como coronavirus de tipo 2 causante del sndrome respiratorio agudo grave (SARS-CoV-2). Ms informacin sobre el nombre de la enfermedad y el virus Workd Health Organization (Organizacin Mundial de la [place]) (OMS): www.who.int/emergencies/diseases/novel-coronavirus-2019/technical-guidance/naming-the-coronavirus-disease-(covid-2019)-and-the-virus-that-causes-it Quines estn en riesgo de sufrir complicaciones debido a la enfermedad por coronavirus? Algunas personas pueden  tener un riesgo ms alto de tener complicaciones debido a la enfermedad por coronavirus. Entre ellas se encuentran los [employer] y las personas que tienen enfermedades crnicas, como enfermedad cardaca, diabetes y enfermedad pulmonar. Si tiene un riesgo ms alto de Advice worker, tome estas precauciones adicionales:  Public affairs consultant en su casa todo lo que sea posible.  [place] reuniones sociales y los viajes.  Evitar el contacto cercano con Producer, television/film/video. Permanecer a una distancia de al menos 6 pies (2 m) de las [employer] Pacific, si es posible.  Lavarse las manos frecuentemente con agua y [place] durante al menos 20segundos.  Evitar tocarse la cara, la boca, la nariz y los ojos.  Tener a [employer] su casa, como alimentos, medicamentos y productos de limpieza.  Si debe salir de su casa, use un barbijo de tela o una mascarilla facial. Asegrese de que le cubra la nariz y la boca. Cmo se transmite la enfermedad causada por el coronavirus? El virus que causa la enfermedad por coronavirus se transmite fcilmente de Ardelia Mems persona a otra (es contagioso). Usted puede contagiarse con este virus:  Al inspirar las gotitas de una persona infectada. Las [employer] pueden diseminarse cuando una persona respira, habla, canta, tose o estornuda.  Al tocar algo, como una mesa o el picaportes de [place], que estuvo expuesto al virus (contaminado) y luego tocarse la boca, nariz o los ojos. Puedo contraer al virus al tocar superficies u objetos? Todava hay mucho que no se conoce acerca del virus que causa la enfermedad por coronavirus. Los cientficos basan gran parte de la informacin en lo que saben sobre virus similares, por ejemplo:  En general, los virus no sobreviven en superficies durante mucho tiempo. Necesitan un cuerpo humano (husped) para sobrevivir.  Es ms probable que el virus se contagie por contacto cercano con  personas que estn enfermas (contacto directo), por  ejemplo: ? Al estrechar las manos o abrazarse. ? Al inhalar las gotitas respiratorias que se desplazan por el aire. Las [employer] Stores pueden diseminarse cuando una persona respira, habla, canta, tose o estornuda.  Es menos probable que el virus se propague cuando una persona toca una superficie o un objeto sobre el que est el virus (contacto indirecto). El virus puede ingresar al cuerpo si la persona toca una superficie o un objeto y [employer] se toca la cara, los ojos, la nariz o la boca. Una persona puede contagiar el virus sin tener sntomas de la enfermedad? Puede ser posible que el virus se contagie antes de que la persona tenga sntomas de la enfermedad, pero muy probablemente esta no sea la principal forma en que el virus se est propagando. Es ms probable que el virus se propague al estar en contacto estrecho con personas que estn enfermas e inhalar las gotas respiratorias que una persona disemina al respirar, Electrical engineer, cantar, toser o estornudar. Cules son los sntomas de la enfermedad causada por el coronavirus? Los sntomas varan de Ardelia Mems persona a otra y pueden variar de leves a graves. [employer], se pueden incluir los siguientes:  Systems analyst o escalofros.  Tos.  Dificultad para respirar o falta de aire.  Dolores de [place], dolores en el cuerpo o dolores musculares.  Secrecin o congestin nasal.  El dolor de garganta.  Nueva prdida del sentido del gusto o del olfato.  Nuseas, vmitos o diarrea. Estos sntomas pueden aparecer en el trmino de 2 a [address] despus de haber estado expuesto al virus. Algunas personas quizs no tengan sntomas. Si presenta sntomas, llame al mdico. Las personas con sntomas graves pueden necesitar atencin hospitalaria. Debo hacerme un anlisis de deteccin del virus? El mdico decidir si debe realizarse un anlisis en funcin de sus sntomas, antecedentes de exposicin y factores de [place]. Cmo realiza el mdico el anlisis para detectar  este virus? Los mdicos obtienen muestras para enviar a Physiological scientist. Estas muestras pueden incluir lo siguiente:  Tomar con un hisopo Truddie Coco de lquido de la parte posterior de la nariz y la garganta, la nariz o la garganta.  Pedirle que tosa mucosidad (esputo) para extraer lquido de los pulmones en un recipiente estril.  Tomar una muestra de [place]. Hay algn tratamiento o vacuna para este virus? Actualmente, no existe ninguna vacuna para prevenir la enfermedad por coronavirus. Adems, no existen [employer] antibiticos o los antivirales para tratar el virus. Una persona que se enferma recibe tratamiento de apoyo, lo que significa reposo y lquidos. Una persona tambin puede aliviar sus sntomas con medicamentos de venta libre para tratar los estornudos, la tos y el goteo nasal. Son los mismos medicamentos que se toman para el resfro comn. Si presenta sntomas, llame al mdico. Las personas con sntomas graves pueden necesitar atencin hospitalaria. Qu puedo hacer para protegerme y proteger a mi familia de este virus?     Puede protegerse y proteger a su familia tomando las mismas medidas que tomara para prevenir el contagio de otros virus. [employer] las siguientes medidas:  Lavarse las manos frecuentemente con agua y [place] durante al menos 20segundos. Usar desinfectante para manos con alcohol si no dispone de [place] y [place].  Evitar tocarse la cara, la boca, la nariz y los ojos.  Toser o estornudar en un pauelo descartable, sobre su manga o codo. No toser o estornudar al aire ni cubrirse con la [place]. ? Si tose  o estornuda en un pauelo de papel, deschelo inmediatamente y [employer].  Desinfectar los [employer] y las superficies que se tocan con frecuencia todos [place].  Aljese de [place].  Evite salir de su casa, siga las indicaciones de su estado y [place] autoridades sanitarias locales.  Evite los espacios interiores repletos de gente. Permanezca  a una distancia de al menos 6 pies (2 m) de las Standard Pacific.  Si debe salir de su casa, use un barbijo de tela o una mascarilla facial. Asegrese de que le cubra la nariz y la boca.  Jimmye Norman en su casa si est enfermo, excepto para obtener atencin mdica. Llame al mdico antes de buscar atencin mdica. El mdico le indicar cunto tiempo debe quedarse en casa.  Asegrese de [place] vacunas al da. Pregntele al mdico qu vacunas necesita. Qu debo hacer si tengo que viajar? Siga las recomendaciones relacionadas con los viajes de la autoridad de Arboriculturist, los CDC y Heritage manager. Informacin y consejos para Archivist for Disease Control and Prevention Librarian, academic) (Centros para el Control y la Prevencin de Arboriculturist): [email or web address].hu  Organizacin [place] (OMS): ThirdIncome.ca [employer] riesgos y tome medidas para proteger su salud  El riesgo de Museum/gallery curator la enfermedad por coronavirus es ms alto si viaja a zonas con un brote o si est en contacto con viajeros que provienen de zonas donde hay un brote.  Lvese las manos con frecuencia y [place] higiene [place] para reducir el riesgo de contagiarse o transmitir el virus. Qu debo hacer si estoy enfermo? Instrucciones generales para detener la propagacin de la infeccin  Lavarse las manos frecuentemente con agua y jabn durante al menos 20segundos. Usar desinfectante para manos con alcohol si no dispone de [place] y [place].  Toser o estornudar en un pauelo descartable, sobre su manga o codo. No toser o estornudar al aire ni cubrirse con la [place].  Si tose o estornuda en un pauelo de papel, deschelo inmediatamente y [employer] Electric.  Foy [place] en su casa a menos que deba recibir [employer]. Llame al mdico o a la autoridad de salud local antes de buscar atencin mdica.  Evite las zonas pblicas. No viaje en  transporte pblico, de ser posible.  Si puede, use un barbijo si debe salir de la casa o si est en contacto cercano con alguien que no est enfermo. Asegrese de que le cubra la nariz y la boca. Mantenga su casa limpia  Desinfecte los objetos y las superficies que se tocan con frecuencia todos [place]. Pueden incluir: ? Encimeras y [place]. ? Picaportes e interruptores de luz. ? Lavabos, fregaderos y grifos. ? Aparatos electrnicos tales como telfonos, controles remotos, teclados, computadoras y tabletas.  Lave los platos con agua jabonosa caliente o en el lavavajillas. Deje los platos para que se sequen al aire.  Lave la ropa con agua caliente. Evite infectar a otros miembros de la familia  Permita que los miembros de la familia sanos cuiden a los nios y las [place], si es posible. Si tiene que cuidar a los nios o las mascotas, lvese las manos con frecuencia y use un barbijo.  Duerma en una habitacin o cama diferentes, si es posible.  No comparta elementos personales, como afeitadoras, cepillos de dientes, desodorantes, peines, cepillos, toallas y toallitas de mano. Dnde buscar ms informacin Centers for Disease Control and Prevention (CDC)  Actualizaciones de informacin y novedades: https://www.butler-gonzalez.com/ Organizacin Mundial de la Salud (OMS)  Actualizaciones de informacin y novedades: MissExecutive.com.ee  Tema de salud relacionado con el coronavirus: https://www.castaneda.info/  Preguntas y [employer] COVID-19: OpportunityDebt.at  Registro mundial: who.sprinklr.com American Academy of Pediatrics (AAP) ([place])  Informacin para familias: www.healthychildren.org/English/health-issues/conditions/chest-lungs/Pages/2019-Novel-Coronavirus.aspx La situacin del coronavirus cambia rpidamente. Consulte el sitio web de su autoridad de Arboriculturist o  los sitios web de los CDC y la OMS para enterarse [place] novedades y noticias. Cundo debo comunicarme con un mdico?  Comunquese con su mdico si tiene sntomas de infeccin, como fiebre o tos, y: ? [place] de alguien que sabe que tiene la enfermedad por coronavirus. ? Devota Pace en contacto con una persona que presuntamente sufra de la enfermedad por coronavirus. ? Ha viajado a una zona donde hay un brote de COVID-19. Cundo debo buscar asistencia mdica inmediata?  Busque ayuda de inmediato llamando al servicio de emergencias de su localidad (911 en los [place]) si tiene lo siguiente: ? Dificultad para respirar. ? Dolor u opresin en el pecho. ? Confusin. ? Labios y uas de [employer]. ? Dificultad para despertarse. ? Sntomas que empeoran. Informe al personal mdico de emergencias si cree que tiene la enfermedad por coronavirus. Resumen  Un nuevo virus respiratorio se propaga de Ardelia Mems persona a otra y causa COVID-19 (enfermedad por coronavirus).  El virus que causa el COVID-19 parece diseminarse fcilmente. Se transmite de [place] persona a otra a travs de las [employer] se despiden al respirar, Electrical engineer, cantar, toser o estornudar.  Los [employer] Corporation y las personas que tienen enfermedades crnicas tienen mayor riesgo de Emergency planning/management officer enfermedad. Si tiene un riesgo ms alto de tener complicaciones, tome Geophysical data processor.  Actualmente, no existe ninguna vacuna para prevenir la enfermedad por coronavirus. No existen medicamentos, como los antibiticos o los antivirales, para tratar el virus.  Puede protegerse y proteger a su familia al lavarse las manos con frecuencia, evitar tocarse la cara y cubrirse al toser y Brewing technologist. Esta informacin no tiene Marine scientist el consejo del mdico. Asegrese de hacerle al mdico cualquier pregunta que tenga. Document Revised: 07/04/2019 Document Reviewed: 01/07/2019 Elsevier Patient Education  [place] frecuentes sobre el COVID-19 COVID-19 Frequently Asked Questions El COVID-19 (enfermedad por coronavirus) es una infeccin causada por una gran familia de virus. Algunos virus causan [facility] y otros causan enfermedades en animales tales como los camellos, los gatos y los murcilagos. En algunos casos, los virus que causan [employer] Inc pueden transmitirse a los seres humanos. De dnde provino el coronavirus? En diciembre de 2019, [place] le inform a Advertising account executive (Organizacin Mundial de la [place], Medical laboratory scientific officer) acerca de varios casos de enfermedad pulmonar (enfermedad respiratoria humana). Estos casos estaban vinculados con un mercado abierto de frutos de mar y [place] en [place]. El vnculo con el mercado de ganado y [employer] sugiere que el virus puede haberse propagado de los animales a los [place]. Sin embargo, desde Engineer, materials brote en diciembre, tambin se ha demostrado que el virus se contagia de [place] persona a Theatre manager. Cul es el nombre de la enfermedad y del virus? Nombre de la enfermedad Al principio, esta enfermedad se llam nuevo coronavirus. Esto se debe a que los cientficos determinaron que la enfermedad era causada por un nuevo virus respiratorio. [employer] (Organizacin Mundial de la [place], [place]) ahora ha dado a la enfermedad el nombre de COVID-19, o enfermedad por coronavirus. Nombre del virus El  virus causante de la enfermedad se conoce como coronavirus de tipo 2 causante del sndrome respiratorio agudo grave (SARS-CoV-2). Ms informacin sobre el nombre de la enfermedad y el virus Workd Health Organization (Organizacin Mundial de la [place]) (OMS): www.who.int/emergencies/diseases/novel-coronavirus-2019/technical-guidance/naming-the-coronavirus-disease-(covid-2019)-and-the-virus-that-causes-it Quines estn en riesgo de sufrir complicaciones debido a la enfermedad por coronavirus? Algunas personas  pueden tener un riesgo ms alto de tener complicaciones debido a la enfermedad por coronavirus. Entre ellas se encuentran los [employer] y las personas que tienen enfermedades crnicas, como enfermedad cardaca, diabetes y enfermedad pulmonar. Si tiene un riesgo ms alto de Advice worker, tome estas precauciones adicionales:  Public affairs consultant en su casa todo lo que sea posible.  [place] reuniones sociales y los viajes.  Evitar el contacto cercano con Producer, television/film/video. Permanecer a una distancia de al menos 6 pies (2 m) de las Standard Pacific, si es posible.  Lavarse las manos frecuentemente con agua y [place] durante al menos 20segundos.  Evitar tocarse la cara, la boca, la nariz y los ojos.  Tener a American [employer] Group su casa, como alimentos, medicamentos y productos de limpieza.  Si debe salir de su casa, use un barbijo de tela o una mascarilla facial. Asegrese de que le cubra la nariz y la boca. Cmo se transmite la enfermedad causada por el coronavirus? El virus que causa la enfermedad por coronavirus se transmite fcilmente de Ardelia Mems persona a otra (es contagioso). Usted puede contagiarse con este virus:  Al inspirar las gotitas de una persona infectada. Las [employer] pueden diseminarse cuando una persona respira, habla, canta, tose o estornuda.  Al tocar algo, como una mesa o el picaportes de [place], que estuvo expuesto al virus (contaminado) y luego tocarse la boca, nariz o los ojos. Puedo contraer al virus al tocar superficies u objetos? Todava hay mucho que no se conoce acerca del virus que causa la enfermedad por coronavirus. Los cientficos basan gran parte de la informacin en lo que saben sobre virus similares, por ejemplo:  En general, los virus no sobreviven en superficies durante mucho tiempo. Necesitan un cuerpo humano (husped) para sobrevivir.  Es ms probable que el virus se contagie por contacto cercano con personas que estn enfermas (contacto directo),  por ejemplo: ? Al estrechar las manos o abrazarse. ? Al inhalar las gotitas respiratorias que se desplazan por el aire. Las [employer] pueden diseminarse cuando una persona respira, habla, canta, tose o estornuda.  Es menos probable que el virus se propague cuando una persona toca una superficie o un objeto sobre el que est el virus (contacto indirecto). El virus puede ingresar al cuerpo si la persona toca una superficie o un objeto y [employer] se toca la cara, los ojos, la nariz o la boca. Una persona puede contagiar el virus sin tener sntomas de la enfermedad? Puede ser posible que el virus se contagie antes de que la persona tenga sntomas de la enfermedad, pero muy probablemente esta no sea la principal forma en que el virus se est propagando. Es ms probable que el virus se propague al estar en contacto estrecho con personas que estn enfermas e inhalar las gotas respiratorias que una persona disemina al respirar, Electrical engineer, cantar, toser o estornudar. Cules son los sntomas de la enfermedad causada por el coronavirus? Los sntomas varan de Ardelia Mems persona a otra y pueden variar de leves a graves. [employer], se pueden incluir los siguientes:  Systems analyst o escalofros.  Tos.  Dificultad para respirar o falta de aire.  Dolores de [place],  dolores en el cuerpo o dolores musculares.  Secrecin o congestin nasal.  El dolor de garganta.  Nueva prdida del sentido del gusto o del olfato.  Nuseas, vmitos o diarrea. Estos sntomas pueden aparecer en el trmino de 2 a [address] despus de haber estado expuesto al virus. Algunas personas quizs no tengan sntomas. Si presenta sntomas, llame al mdico. Las personas con sntomas graves pueden necesitar atencin hospitalaria. Debo hacerme un anlisis de deteccin del virus? El mdico decidir si debe realizarse un anlisis en funcin de sus sntomas, antecedentes de exposicin y factores de [employer]. Cmo realiza el mdico el anlisis para detectar  este virus? Los mdicos obtienen muestras para enviar a Physiological scientist. Estas muestras pueden incluir lo siguiente:  Tomar con un hisopo Truddie Coco de lquido de la parte posterior de la nariz y la garganta, la nariz o la garganta.  Pedirle que tosa mucosidad (esputo) para extraer lquido de los pulmones en un recipiente estril.  Tomar una muestra de [place]. Hay algn tratamiento o vacuna para este virus? Actualmente, no existe ninguna vacuna para prevenir la enfermedad por coronavirus. Adems, no existen [employer] antibiticos o los antivirales para tratar el virus. Una persona que se enferma recibe tratamiento de apoyo, lo que significa reposo y lquidos. Una persona tambin puede aliviar sus sntomas con medicamentos de venta libre para tratar los estornudos, la tos y el goteo nasal. Son los mismos medicamentos que se toman para el resfro comn. Si presenta sntomas, llame al mdico. Las personas con sntomas graves pueden necesitar atencin hospitalaria. Qu puedo hacer para protegerme y proteger a mi familia de este virus?     Puede protegerse y proteger a su familia tomando las mismas medidas que tomara para prevenir el contagio de otros virus. [employer] las siguientes medidas:  Lavarse las manos frecuentemente con agua y [place] durante al menos 20segundos. Usar desinfectante para manos con alcohol si no dispone de [place] y [place].  Evitar tocarse la cara, la boca, la nariz y los ojos.  Toser o estornudar en un pauelo descartable, sobre su manga o codo. No toser o estornudar al aire ni cubrirse con la [place]. ? Si tose o estornuda en un pauelo de papel, deschelo inmediatamente y [employer].  Desinfectar los [employer]ie y las superficies que se tocan con frecuencia todos [place].  Aljese de [place].  Evite salir de su casa, siga las indicaciones de su estado y [place] autoridades sanitarias locales.  Evite los espacios interiores repletos de gente. Permanezca  a una distancia de al menos 6 pies (2 m) de las [employer] Pacific.  Si debe salir de su casa, use un barbijo de tela o una mascarilla facial. Asegrese de que le cubra la nariz y la boca.  Jimmye Norman en su casa si est enfermo, excepto para obtener atencin mdica. Llame al mdico antes de buscar atencin mdica. El mdico le indicar cunto tiempo debe quedarse en casa.  Asegrese de [place] vacunas al da. Pregntele al mdico qu vacunas necesita. Qu debo hacer si tengo que viajar? Siga las recomendaciones relacionadas con los viajes de la autoridad de Arboriculturist, los CDC y Heritage manager. Informacin y consejos para Archivist for Disease Control and Prevention Librarian, academic) (Centros para Building surveyor y la Prevencin de Arboriculturist): BodyEditor.hu  Organizacin [place] (OMS): ThirdIncome.ca [employer] riesgos y tome medidas para proteger su salud  El riesgo de contraer la enfermedad por coronavirus es ms alto si viaja  a zonas con un brote o si est en contacto con viajeros que provienen de zonas donde hay un brote.  Lvese las manos con frecuencia y [place] higiene [place] para reducir el riesgo de contagiarse o transmitir el virus. Qu debo hacer si estoy enfermo? Instrucciones generales para detener la propagacin de la infeccin  Lavarse las manos frecuentemente con agua y jabn durante al menos 20segundos. Usar desinfectante para manos con alcohol si no dispone de [place] y [place].  Toser o estornudar en un pauelo descartable, sobre su manga o codo. No toser o estornudar al aire ni cubrirse con la [place].  Si tose o estornuda en un pauelo de papel, deschelo inmediatamente y [employer].  Foy [place] en su casa a menos que deba recibir [employer]. Llame al mdico o a la autoridad de salud local antes de buscar atencin mdica.  Evite las zonas pblicas. No viaje en  transporte pblico, de ser posible.  Si puede, use un barbijo si debe salir de la casa o si est en contacto cercano con alguien que no est enfermo. Asegrese de que le cubra la nariz y la boca. Mantenga su casa limpia  Desinfecte los objetos y las superficies que se tocan con frecuencia todos [place]. Pueden incluir: ? Encimeras y [place]. ? Picaportes e interruptores de luz. ? Lavabos, fregaderos y grifos. ? Aparatos electrnicos tales como telfonos, controles remotos, teclados, computadoras y tabletas.  Lave los platos con agua jabonosa caliente o en el lavavajillas. Deje los platos para que se sequen al aire.  Lave la ropa con agua caliente. Evite infectar a otros miembros de la familia  Permita que los miembros de la familia sanos cuiden a los nios y las [place], si es posible. Si tiene que cuidar a los nios o las mascotas, lvese las manos con frecuencia y use un barbijo.  Duerma en una habitacin o cama diferentes, si es posible.  No comparta elementos personales, como afeitadoras, cepillos de dientes, desodorantes, peines, cepillos, toallas y toallitas de mano. Dnde buscar ms informacin Centers for Disease Control and Prevention (CDC)  Actualizaciones de informacin y novedades: https://www.butler-gonzalez.com/ Organizacin Mundial de la Salud (OMS)  Actualizaciones de informacin y novedades: MissExecutive.com.ee  Tema de salud relacionado con el coronavirus: https://www.castaneda.info/  Preguntas y Chartered certified accountant sobre COVID-19: OpportunityDebt.at  Registro mundial: who.sprinklr.com American Academy of Pediatrics (AAP) ([place])  Informacin para familias: www.healthychildren.org/English/health-issues/conditions/chest-lungs/Pages/2019-Novel-Coronavirus.aspx La situacin del coronavirus cambia rpidamente. Consulte el sitio web de su autoridad de Arboriculturist o  los sitios web de los CDC y la OMS para enterarse Wynona novedades y noticias. Cundo debo comunicarme con un mdico?  Comunquese con su mdico si tiene sntomas de infeccin, como fiebre o tos, y: ? [place] de alguien que sabe que tiene la enfermedad por coronavirus. ? Devota Pace en contacto con una persona que presuntamente sufra de la enfermedad por coronavirus. ? Ha viajado a una zona donde hay un brote de COVID-19. Cundo debo buscar asistencia mdica inmediata?  Busque ayuda de inmediato llamando al servicio de emergencias de su localidad (911 en los [place]) si tiene lo siguiente: ? Dificultad para respirar. ? Dolor u opresin en el pecho. ? Confusin. ? Labios y uas de [employer]. ? Dificultad para despertarse. ? Sntomas que empeoran. Informe al personal mdico de emergencias si cree que tiene la enfermedad por coronavirus. Resumen  Un nuevo virus respiratorio se propaga de Ardelia Mems persona a otra y causa COVID-19 (enfermedad por coronavirus).  El virus que causa el COVID-19 parece diseminarse fcilmente. Se transmite de [place] persona a otra a travs de las [employer] se despiden al respirar, Electrical engineer, cantar, toser o estornudar.  Los [employer] y las personas que tienen enfermedades crnicas tienen mayor riesgo de Emergency planning/management officer enfermedad. Si tiene un riesgo ms alto de tener complicaciones, tome Geophysical data processor.  Actualmente, no existe ninguna vacuna para prevenir la enfermedad por coronavirus. No existen medicamentos, como los antibiticos o los antivirales, para tratar el virus.  Puede protegerse y proteger a su familia al lavarse las manos con frecuencia, evitar tocarse la cara y cubrirse al toser y Brewing technologist. Esta informacin no tiene Marine scientist el consejo del mdico. Asegrese de hacerle al mdico cualquier pregunta que tenga. Document Revised: 07/04/2019 Document Reviewed: 01/07/2019 Elsevier Patient Education  [place].  What types of side effects do monoclonal antibody drugs cause?  Common side effects  In general, the more common side effects caused by monoclonal antibody drugs include: . Allergic reactions, such as hives or itching . Flu-like signs and symptoms, including chills, fatigue, fever, and muscle aches and pains . Nausea, vomiting . Diarrhea . Skin rashes . Low blood pressure   The CDC is recommending patients who receive monoclonal antibody treatments wait at least 90 days before being vaccinated.  Currently, there are no data on the safety and efficacy of mRNA COVID-19 vaccines in persons who received monoclonal antibodies or convalescent plasma as part of COVID-19 treatment. Based on the estimated half-life of such therapies as well as evidence suggesting that reinfection is uncommon in the 90 days after initial infection, vaccination should be deferred for at least 90 days, as a precautionary measure until additional information becomes available, to avoid interference of the antibody treatment with vaccine-induced immune responses.

## 2020-06-24 ENCOUNTER — Other Ambulatory Visit (HOSPITAL_COMMUNITY): Payer: Self-pay

## 2020-06-24 ENCOUNTER — Ambulatory Visit (HOSPITAL_BASED_OUTPATIENT_CLINIC_OR_DEPARTMENT_OTHER): Admission: RE | Admit: 2020-06-24 | Payer: Self-pay | Source: Home / Self Care | Admitting: General Surgery

## 2020-06-24 SURGERY — BREAST LUMPECTOMY WITH RADIOACTIVE SEED AND SENTINEL LYMPH NODE BIOPSY
Anesthesia: General | Site: Breast | Laterality: Left

## 2020-06-30 ENCOUNTER — Encounter: Payer: Self-pay | Admitting: *Deleted

## 2020-07-01 ENCOUNTER — Encounter: Payer: Self-pay | Admitting: *Deleted

## 2020-07-01 ENCOUNTER — Telehealth: Payer: Self-pay | Admitting: Oncology

## 2020-07-01 NOTE — Telephone Encounter (Signed)
Per 10/20 sch msg rescheduled appt to 11/24 called patient - she is aware

## 2020-07-06 NOTE — Progress Notes (Signed)
[place], [place] [address] Phone: [phone] Fax: [phone]  [place], [place] - [place] [address] Phone: [phone] Fax: [phone]      Your procedure is scheduled on Wednesday, November 3rd.  Report to [facility] Main Entrance "A" at 9:45 A.M., and check in at the Admitting office.  Call this number if you have problems the morning of surgery:  [phone]  Call [phone] if you have any questions prior to your surgery date Monday-Friday 8am-4pm    Remember:  Do not eat after midnight the night before your surgery  You may drink clear liquids until 8:45 AM the morning of your surgery.   Clear liquids allowed are: Water, Non-Citrus Juices (without pulp), Carbonated Beverages, Clear Tea, Black Coffee Only, and Gatorade  Please finish your pre-surgery Ensure by 8:45 AM, the morning of surgery.  Please drink all in one sitting.  Do not sip.  Nothing else to drink after you finish the Ensure.    Take these medicines the morning of surgery with A SIP OF WATER   Tylenol - if needed  Albuterol inhaler - if needed (bring with you on day of surgery)  As of today, STOP taking any Aspirin (unless otherwise instructed by your surgeon) Aleve, Naproxen, Ibuprofen, Motrin, Advil, Goody's, BC's, all herbal medications, fish oil, and all vitamins.                      Do not wear jewelry, make up, or nail polish            Do not wear lotions, powders, perfumes, or deodorant.            Do not shave 48 hours prior to surgery.              Do not bring valuables to the hospital.            [facility] is not responsible for any belongings or valuables.  Do NOT Smoke (Tobacco/Vaping) or drink Alcohol 24 hours prior to your procedure If you use a CPAP at night, you may bring all equipment for your overnight stay.    Contacts, glasses, dentures or bridgework may not be worn into surgery.      For patients admitted to the hospital, discharge time will be determined by your treatment team.   Patients discharged the day of surgery will not be allowed to drive home, and someone needs to stay with them for 24 hours.    Special instructions:   [place]- Preparing For Surgery  Before surgery, you can play an important role. Because skin is not sterile, your skin needs to be as free of germs as possible. You can reduce the number of germs on your skin by washing with CHG (chlorahexidine gluconate) Soap before surgery.  CHG is an antiseptic cleaner which kills germs and bonds with the skin to continue killing germs even after washing.    Oral Hygiene is also important to reduce your risk of infection.  Remember - BRUSH YOUR TEETH THE MORNING OF SURGERY WITH YOUR REGULAR TOOTHPASTE  Please do not use if you have an allergy to CHG or antibacterial soaps. If your skin becomes reddened/irritated stop using the CHG.  Do not shave (including legs and underarms) for at least 48 hours prior to first CHG shower. It is  OK to shave your face.  Please follow these instructions carefully.   1. Shower the NIGHT BEFORE SURGERY and the MORNING OF SURGERY with CHG Soap.   2. If you chose to wash your hair, wash your hair first as usual with your normal shampoo.  3. After you shampoo, rinse your hair and body thoroughly to remove the shampoo.  4. Use CHG as you would any other liquid soap. You can apply CHG directly to the skin and wash gently with a scrungie or a clean washcloth.   5. Apply the CHG Soap to your body ONLY FROM THE NECK DOWN.  Do not use on open wounds or open sores. Avoid contact with your eyes, ears, mouth and genitals (private parts). Wash Face and genitals (private parts)  with your normal soap.   6. Wash thoroughly, paying special attention to the area where your surgery will be  performed.  7. Thoroughly rinse your body with warm water from the neck down.  8. DO NOT shower/wash with your normal soap after using and rinsing off the CHG Soap.  9. Pat yourself dry with a CLEAN TOWEL.  10. Wear CLEAN PAJAMAS to bed the night before surgery  11. Place CLEAN SHEETS on your bed the night of your first shower and DO NOT SLEEP WITH PETS.   Day of Surgery: Wear Clean/Comfortable clothing the morning of surgery Do not apply any deodorants/lotions.   Remember to brush your teeth WITH YOUR REGULAR TOOTHPASTE.   Please read over the following fact sheets that you were given.

## 2020-07-07 ENCOUNTER — Other Ambulatory Visit: Payer: Self-pay

## 2020-07-07 ENCOUNTER — Encounter (HOSPITAL_COMMUNITY)
Admission: RE | Admit: 2020-07-07 | Discharge: 2020-07-07 | Disposition: A | Discharge status: Home / Self Care | Payer: Self-pay | Source: Ambulatory Visit | Attending: General Surgery | Admitting: General Surgery

## 2020-07-07 ENCOUNTER — Encounter (HOSPITAL_COMMUNITY): Payer: Self-pay

## 2020-07-07 DIAGNOSIS — Z01812 Encounter for preprocedural laboratory examination: Secondary | ICD-10-CM | POA: Insufficient documentation

## 2020-07-07 HISTORY — DX: Malignant neoplasm of unspecified site of unspecified female breast: C50.919

## 2020-07-07 LAB — CBC
HCT: 35.7 % — ABNORMAL LOW (ref 36.0–46.0)
Hemoglobin: 11.3 g/dL — ABNORMAL LOW (ref 12.0–15.0)
MCH: 29.3 pg (ref 26.0–34.0)
MCHC: 31.7 g/dL (ref 30.0–36.0)
MCV: 92.5 fL (ref 80.0–100.0)
Platelets: 222 10*3/uL (ref 150–400)
RBC: 3.86 MIL/uL — ABNORMAL LOW (ref 3.87–5.11)
RDW: 14.6 % (ref 11.5–15.5)
WBC: 3.7 10*3/uL — ABNORMAL LOW (ref 4.0–10.5)
nRBC: 0 % (ref 0.0–0.2)

## 2020-07-07 LAB — POCT PREGNANCY, URINE: Preg Test, Ur: NEGATIVE

## 2020-07-07 NOTE — Progress Notes (Signed)
All Information received with an in-house interpreter.  PCP - Dr. Pamella Pert  Cardiologist - Denies  Chest x-ray - Denies  EKG - Denies  Stress Test - Denies  ECHO - 02/07/20 (E)  Cardiac Cath - Denies  AICD-na PM-na LOOP-na  Sleep Study - Denies CPAP - Denies  LABS- 06/20/20: COVID- Positive- no covid test needed for 90 days 07/07/20: CBC, POC UPreg (negative)  ASA- Denies  ERAS- Yes- Ensure x1  HA1C- Denies  Anesthesia- No  Pt denies having chest pain, sob, or fever at this time. All instructions explained to the pt, with a verbal understanding of the material. Pt agrees to go over the instructions while at home for a better understanding. Pt also instructed to self quarantine after being tested for COVID-19. The opportunity to ask questions was provided.   Coronavirus Screening  Have you experienced the following symptoms:  Cough yes/no: No Fever (>100.41F)  yes/no: No Runny nose yes/no: No Sore throat yes/no: No Difficulty breathing/shortness of breath  yes/no: No  Have you or a family member traveled in the last 14 days and where? yes/no: No   If the patient indicates "YES" to the above questions, their PAT will be rescheduled to limit the exposure to others and, the surgeon will be notified. THE PATIENT WILL NEED TO BE ASYMPTOMATIC FOR 14 DAYS.   If the patient is not experiencing any of these symptoms, the PAT nurse will instruct them to NOT bring anyone with them to their appointment since they may have these symptoms or traveled as well.   Please remind your patients and families that hospital visitation restrictions are in effect and the importance of the restrictions.

## 2020-07-09 ENCOUNTER — Encounter: Payer: Self-pay | Admitting: *Deleted

## 2020-07-14 ENCOUNTER — Ambulatory Visit
Admission: RE | Admit: 2020-07-14 | Discharge: 2020-07-14 | Disposition: A | Discharge status: Home / Self Care | Payer: No Typology Code available for payment source | Source: Ambulatory Visit | Attending: General Surgery | Admitting: General Surgery

## 2020-07-14 ENCOUNTER — Other Ambulatory Visit: Payer: Self-pay

## 2020-07-14 DIAGNOSIS — Z170 Estrogen receptor positive status [ER+]: Secondary | ICD-10-CM

## 2020-07-14 NOTE — Anesthesia Preprocedure Evaluation (Addendum)
Anesthesia Evaluation  Patient identified by MRN, date of birth, ID band Patient awake    Reviewed: Allergy & Precautions, NPO status , Patient's Chart, lab work & pertinent test results  Airway Mallampati: III  TM Distance: >3 FB Neck ROM: Full    Dental  (+) Teeth Intact, Dental Advisory Given,    Pulmonary asthma ,  COVID + 3wks ago  hasn't needed rescue inhaler in a year    Pulmonary exam normal breath sounds clear to auscultation       Cardiovascular negative cardio ROS Normal cardiovascular exam Rhythm:Regular Rate:Normal     Neuro/Psych negative neurological ROS  negative psych ROS   GI/Hepatic negative GI ROS, Neg liver ROS,   Endo/Other  Obesity BMI 37  Renal/GU negative Renal ROS  negative genitourinary   Musculoskeletal negative musculoskeletal ROS (+)   Abdominal (+) + obese,   Peds  Hematology  (+) Blood dyscrasia, anemia , hct 35.7    Anesthesia Other Findings Left breast ca   Reproductive/Obstetrics negative OB ROS                           Anesthesia Physical Anesthesia Plan  ASA: II  Anesthesia Plan: General and Regional   Post-op Pain Management:    Induction: Intravenous  PONV Risk Score and Plan: 3 and Ondansetron, Dexamethasone, Midazolam and Treatment may vary due to age or medical condition  Airway Management Planned: LMA  Additional Equipment: None  Intra-op Plan:   Post-operative Plan: Extubation in OR  Informed Consent: I have reviewed the patients History and Physical, chart, labs and discussed the procedure including the risks, benefits and alternatives for the proposed anesthesia with the patient or authorized representative who has indicated his/her understanding and acceptance.     Dental advisory given  Plan Discussed with: CRNA  Anesthesia Plan Comments:        Anesthesia Quick Evaluation

## 2020-07-15 ENCOUNTER — Encounter (HOSPITAL_COMMUNITY): Payer: Self-pay | Admitting: General Surgery

## 2020-07-15 ENCOUNTER — Encounter (HOSPITAL_COMMUNITY)
Admission: RE | Disposition: A | Discharge status: Home / Self Care | Payer: Self-pay | Source: Home / Self Care | Attending: General Surgery

## 2020-07-15 ENCOUNTER — Other Ambulatory Visit: Payer: Self-pay

## 2020-07-15 ENCOUNTER — Ambulatory Visit
Admission: RE | Admit: 2020-07-15 | Discharge: 2020-07-15 | Disposition: A | Discharge status: Home / Self Care | Payer: No Typology Code available for payment source | Source: Ambulatory Visit | Attending: General Surgery | Admitting: General Surgery

## 2020-07-15 ENCOUNTER — Ambulatory Visit: Payer: Self-pay | Admitting: Oncology

## 2020-07-15 ENCOUNTER — Ambulatory Visit (HOSPITAL_COMMUNITY): Payer: No Typology Code available for payment source | Admitting: Anesthesiology

## 2020-07-15 ENCOUNTER — Ambulatory Visit (HOSPITAL_COMMUNITY)
Admission: RE | Admit: 2020-07-15 | Discharge: 2020-07-15 | Disposition: A | Discharge status: Home / Self Care | Payer: No Typology Code available for payment source | Source: Ambulatory Visit | Attending: General Surgery | Admitting: General Surgery

## 2020-07-15 ENCOUNTER — Ambulatory Visit (HOSPITAL_COMMUNITY)
Admission: RE | Admit: 2020-07-15 | Discharge: 2020-07-15 | Disposition: A | Discharge status: Home / Self Care | Payer: No Typology Code available for payment source | Attending: General Surgery | Admitting: General Surgery

## 2020-07-15 DIAGNOSIS — Z170 Estrogen receptor positive status [ER+]: Secondary | ICD-10-CM

## 2020-07-15 DIAGNOSIS — C50412 Malignant neoplasm of upper-outer quadrant of left female breast: Secondary | ICD-10-CM

## 2020-07-15 DIAGNOSIS — C50912 Malignant neoplasm of unspecified site of left female breast: Secondary | ICD-10-CM | POA: Insufficient documentation

## 2020-07-15 DIAGNOSIS — Z9221 Personal history of antineoplastic chemotherapy: Secondary | ICD-10-CM | POA: Insufficient documentation

## 2020-07-15 DIAGNOSIS — Z8616 Personal history of COVID-19: Secondary | ICD-10-CM | POA: Insufficient documentation

## 2020-07-15 DIAGNOSIS — C773 Secondary and unspecified malignant neoplasm of axilla and upper limb lymph nodes: Secondary | ICD-10-CM | POA: Insufficient documentation

## 2020-07-15 HISTORY — PX: BREAST LUMPECTOMY WITH RADIOACTIVE SEED AND SENTINEL LYMPH NODE BIOPSY: SHX6550

## 2020-07-15 LAB — POCT PREGNANCY, URINE: Preg Test, Ur: NEGATIVE

## 2020-07-15 SURGERY — BREAST LUMPECTOMY WITH RADIOACTIVE SEED AND SENTINEL LYMPH NODE BIOPSY
Anesthesia: Regional | Site: Breast | Laterality: Left

## 2020-07-15 MED ORDER — OXYCODONE HCL 5 MG/5ML PO SOLN: 5.0000 mg | Freq: Once | ORAL | Status: DC | PRN

## 2020-07-15 MED ORDER — LIDOCAINE 2% (20 MG/ML) 5 ML SYRINGE
INTRAMUSCULAR | Status: DC | PRN
  Administered 2020-07-15: 20 mg via INTRAVENOUS

## 2020-07-15 MED ORDER — FENTANYL CITRATE (PF) 100 MCG/2ML IJ SOLN
INTRAMUSCULAR | Status: AC
  Administered 2020-07-15: 100 ug via INTRAVENOUS
  Filled 2020-07-15: qty 2

## 2020-07-15 MED ORDER — ORAL CARE MOUTH RINSE: 15.0000 mL | Freq: Once | OROMUCOSAL | Status: AC

## 2020-07-15 MED ORDER — KETOROLAC TROMETHAMINE 30 MG/ML IJ SOLN
INTRAMUSCULAR | Status: AC
  Filled 2020-07-15: qty 1

## 2020-07-15 MED ORDER — MEPERIDINE HCL 25 MG/ML IJ SOLN: 6.2500 mg | INTRAMUSCULAR | Status: DC | PRN

## 2020-07-15 MED ORDER — BUPIVACAINE HCL (PF) 0.5 % IJ SOLN
INTRAMUSCULAR | Status: DC | PRN
  Administered 2020-07-15: 20 mL via PERINEURAL

## 2020-07-15 MED ORDER — ONDANSETRON HCL 4 MG/2ML IJ SOLN
INTRAMUSCULAR | Status: DC | PRN
  Administered 2020-07-15: 4 mg via INTRAVENOUS

## 2020-07-15 MED ORDER — FENTANYL CITRATE (PF) 250 MCG/5ML IJ SOLN
INTRAMUSCULAR | Status: AC
  Filled 2020-07-15: qty 5

## 2020-07-15 MED ORDER — PHENYLEPHRINE 40 MCG/ML (10ML) SYRINGE FOR IV PUSH (FOR BLOOD PRESSURE SUPPORT)
PREFILLED_SYRINGE | INTRAVENOUS | Status: DC | PRN
  Administered 2020-07-15: 80 ug via INTRAVENOUS
  Administered 2020-07-15: 120 ug via INTRAVENOUS
  Administered 2020-07-15: 80 ug via INTRAVENOUS

## 2020-07-15 MED ORDER — MIDAZOLAM HCL 2 MG/2ML IJ SOLN
INTRAMUSCULAR | Status: AC
  Administered 2020-07-15: 2 mg via INTRAVENOUS
  Filled 2020-07-15: qty 2

## 2020-07-15 MED ORDER — KETOROLAC TROMETHAMINE 15 MG/ML IJ SOLN
INTRAMUSCULAR | Status: AC
  Administered 2020-07-15: 15 mg via INTRAVENOUS
  Filled 2020-07-15: qty 1

## 2020-07-15 MED ORDER — PROPOFOL 10 MG/ML IV BOLUS
INTRAVENOUS | Status: AC
  Filled 2020-07-15: qty 20

## 2020-07-15 MED ORDER — ACETAMINOPHEN 500 MG PO TABS
ORAL_TABLET | ORAL | Status: AC
  Administered 2020-07-15: 1000 mg via ORAL
  Filled 2020-07-15: qty 2

## 2020-07-15 MED ORDER — KETOROLAC TROMETHAMINE 15 MG/ML IJ SOLN: 15.0000 mg | INTRAMUSCULAR | Status: AC

## 2020-07-15 MED ORDER — HEMOSTATIC AGENTS (NO CHARGE) OPTIME
TOPICAL | Status: DC | PRN
  Administered 2020-07-15: 1 via TOPICAL

## 2020-07-15 MED ORDER — 0.9 % SODIUM CHLORIDE (POUR BTL) OPTIME
TOPICAL | Status: DC | PRN
  Administered 2020-07-15: 1000 mL

## 2020-07-15 MED ORDER — CEFAZOLIN SODIUM-DEXTROSE 2-4 GM/100ML-% IV SOLN
INTRAVENOUS | Status: AC
  Filled 2020-07-15: qty 100

## 2020-07-15 MED ORDER — DEXAMETHASONE SODIUM PHOSPHATE 10 MG/ML IJ SOLN
INTRAMUSCULAR | Status: DC | PRN
  Administered 2020-07-15: 5 mg via INTRAVENOUS

## 2020-07-15 MED ORDER — PROMETHAZINE HCL 25 MG/ML IJ SOLN: 6.2500 mg | INTRAMUSCULAR | Status: DC | PRN

## 2020-07-15 MED ORDER — OXYCODONE HCL 5 MG PO TABS: 5.0000 mg | ORAL_TABLET | Freq: Once | ORAL | Status: DC | PRN

## 2020-07-15 MED ORDER — HYDROMORPHONE HCL 1 MG/ML IJ SOLN: 0.2500 mg | INTRAMUSCULAR | Status: DC | PRN

## 2020-07-15 MED ORDER — CEFAZOLIN SODIUM-DEXTROSE 2-4 GM/100ML-% IV SOLN
2.0000 g | INTRAVENOUS | Status: AC
  Administered 2020-07-15: 2 g via INTRAVENOUS

## 2020-07-15 MED ORDER — GABAPENTIN 100 MG PO CAPS: 100.0000 mg | ORAL_CAPSULE | ORAL | Status: AC

## 2020-07-15 MED ORDER — MIDAZOLAM HCL 2 MG/2ML IJ SOLN
INTRAMUSCULAR | Status: AC
  Filled 2020-07-15: qty 2

## 2020-07-15 MED ORDER — BUPIVACAINE LIPOSOME 1.3 % IJ SUSP
INTRAMUSCULAR | Status: DC | PRN
  Administered 2020-07-15: 10 mL

## 2020-07-15 MED ORDER — BUPIVACAINE HCL 0.25 % IJ SOLN
INTRAMUSCULAR | Status: DC | PRN
  Administered 2020-07-15: 5 mL

## 2020-07-15 MED ORDER — GABAPENTIN 100 MG PO CAPS
ORAL_CAPSULE | ORAL | Status: AC
  Administered 2020-07-15: 100 mg via ORAL
  Filled 2020-07-15: qty 1

## 2020-07-15 MED ORDER — METHYLENE BLUE 0.5 % INJ SOLN
INTRAVENOUS | Status: AC
  Filled 2020-07-15: qty 10

## 2020-07-15 MED ORDER — CHLORHEXIDINE GLUCONATE 0.12 % MT SOLN
OROMUCOSAL | Status: AC
  Administered 2020-07-15: 15 mL via OROMUCOSAL
  Filled 2020-07-15: qty 15

## 2020-07-15 MED ORDER — KETOROLAC TROMETHAMINE 30 MG/ML IJ SOLN
30.0000 mg | Freq: Once | INTRAMUSCULAR | Status: AC | PRN
  Administered 2020-07-15: 30 mg via INTRAVENOUS

## 2020-07-15 MED ORDER — BUPIVACAINE HCL (PF) 0.25 % IJ SOLN
INTRAMUSCULAR | Status: AC
  Filled 2020-07-15: qty 30

## 2020-07-15 MED ORDER — LACTATED RINGERS IV SOLN: INTRAVENOUS | Status: DC

## 2020-07-15 MED ORDER — CHLORHEXIDINE GLUCONATE 0.12 % MT SOLN: 15.0000 mL | Freq: Once | OROMUCOSAL | Status: AC

## 2020-07-15 MED ORDER — PHENYLEPHRINE HCL-NACL 10-0.9 MG/250ML-% IV SOLN
INTRAVENOUS | Status: DC | PRN
  Administered 2020-07-15: 20 ug/min via INTRAVENOUS

## 2020-07-15 MED ORDER — MIDAZOLAM HCL 2 MG/2ML IJ SOLN: 2.0000 mg | Freq: Once | INTRAMUSCULAR | Status: AC

## 2020-07-15 MED ORDER — ACETAMINOPHEN 500 MG PO TABS: 1000.0000 mg | ORAL_TABLET | ORAL | Status: AC

## 2020-07-15 MED ORDER — ROCURONIUM BROMIDE 10 MG/ML (PF) SYRINGE
PREFILLED_SYRINGE | INTRAVENOUS | Status: AC
  Filled 2020-07-15: qty 10

## 2020-07-15 MED ORDER — OXYCODONE HCL 5 MG PO TABS
5.0000 mg | ORAL_TABLET | Freq: Four times a day (QID) | ORAL | 0 refills | Status: DC | PRN
Start: 2020-07-15 — End: 2020-09-16

## 2020-07-15 MED ORDER — FENTANYL CITRATE (PF) 100 MCG/2ML IJ SOLN: 100.0000 ug | Freq: Once | INTRAMUSCULAR | Status: AC

## 2020-07-15 MED ORDER — SODIUM CHLORIDE (PF) 0.9 % IJ SOLN
INTRAVENOUS | Status: DC | PRN
  Administered 2020-07-15: 5 mL via INTRAMUSCULAR

## 2020-07-15 MED ORDER — PROPOFOL 10 MG/ML IV BOLUS
INTRAVENOUS | Status: DC | PRN
  Administered 2020-07-15: 200 mg via INTRAVENOUS

## 2020-07-15 MED ORDER — LIDOCAINE 2% (20 MG/ML) 5 ML SYRINGE
INTRAMUSCULAR | Status: AC
  Filled 2020-07-15: qty 5

## 2020-07-15 MED ORDER — ONDANSETRON HCL 4 MG/2ML IJ SOLN
INTRAMUSCULAR | Status: AC
  Filled 2020-07-15: qty 2

## 2020-07-15 MED ORDER — TECHNETIUM TC 99M TILMANOCEPT KIT
1.0000 | PACK | Freq: Once | INTRAVENOUS | Status: AC | PRN
  Administered 2020-07-15: 1 via INTRADERMAL

## 2020-07-15 MED ORDER — FENTANYL CITRATE (PF) 250 MCG/5ML IJ SOLN
INTRAMUSCULAR | Status: DC | PRN
  Administered 2020-07-15 (×3): 25 ug via INTRAVENOUS

## 2020-07-15 MED ORDER — ENSURE PRE-SURGERY PO LIQD
296.0000 mL | Freq: Once | ORAL | Status: AC
  Administered 2020-07-15: 296 mL via ORAL

## 2020-07-15 SURGICAL SUPPLY — 48 items
ADH SKN CLS APL DERMABOND .7 (GAUZE/BANDAGES/DRESSINGS) ×1
APL PRP STRL LF DISP 70% ISPRP (MISCELLANEOUS) ×1
APPLIER CLIP 9.375 MED OPEN (MISCELLANEOUS)
APR CLP MED 9.3 20 MLT OPN (MISCELLANEOUS)
BINDER BREAST LRG (GAUZE/BANDAGES/DRESSINGS) IMPLANT
BINDER BREAST XLRG (GAUZE/BANDAGES/DRESSINGS) IMPLANT
CANISTER SUCT 3000ML PPV (MISCELLANEOUS) ×2 IMPLANT
CHLORAPREP W/TINT 26 (MISCELLANEOUS) ×2 IMPLANT
CLIP APPLIE 9.375 MED OPEN (MISCELLANEOUS) IMPLANT
CLOSURE STERI-STRIP 1/4X4 (GAUZE/BANDAGES/DRESSINGS) ×1 IMPLANT
COVER PROBE W GEL 5X96 (DRAPES) ×2 IMPLANT
COVER SURGICAL LIGHT HANDLE (MISCELLANEOUS) ×2 IMPLANT
DERMABOND ADVANCED (GAUZE/BANDAGES/DRESSINGS) ×1
DERMABOND ADVANCED .7 DNX12 (GAUZE/BANDAGES/DRESSINGS) ×1 IMPLANT
DEVICE DUBIN SPECIMEN MAMMOGRA (MISCELLANEOUS) ×4 IMPLANT
DRAPE CHEST BREAST 15X10 FENES (DRAPES) ×2 IMPLANT
ELECT COATED BLADE 2.86 ST (ELECTRODE) ×2 IMPLANT
ELECT REM PT RETURN 9FT ADLT (ELECTROSURGICAL) ×2
ELECTRODE REM PT RTRN 9FT ADLT (ELECTROSURGICAL) ×1 IMPLANT
GLOVE BIO SURGEON STRL SZ7 (GLOVE) ×2 IMPLANT
GLOVE BIOGEL PI IND STRL 7.5 (GLOVE) ×1 IMPLANT
GLOVE BIOGEL PI INDICATOR 7.5 (GLOVE) ×1
GOWN STRL REUS W/ TWL LRG LVL3 (GOWN DISPOSABLE) ×2 IMPLANT
GOWN STRL REUS W/TWL LRG LVL3 (GOWN DISPOSABLE) ×4
HEMOSTAT ARISTA ABSORB 3G PWDR (HEMOSTASIS) ×1 IMPLANT
ILLUMINATOR WAVEGUIDE N/F (MISCELLANEOUS) IMPLANT
KIT BASIN OR (CUSTOM PROCEDURE TRAY) ×2 IMPLANT
KIT MARKER MARGIN INK (KITS) ×2 IMPLANT
NDL 18GX1X1/2 (RX/OR ONLY) (NEEDLE) IMPLANT
NDL FILTER BLUNT 18X1 1/2 (NEEDLE) IMPLANT
NDL HYPO 25GX1X1/2 BEV (NEEDLE) ×1 IMPLANT
NEEDLE 18GX1X1/2 (RX/OR ONLY) (NEEDLE) IMPLANT
NEEDLE FILTER BLUNT 18X 1/2SAF (NEEDLE)
NEEDLE FILTER BLUNT 18X1 1/2 (NEEDLE) IMPLANT
NEEDLE HYPO 25GX1X1/2 BEV (NEEDLE) ×2 IMPLANT
NS IRRIG 1000ML POUR BTL (IV SOLUTION) ×2 IMPLANT
PACK GENERAL/GYN (CUSTOM PROCEDURE TRAY) ×2 IMPLANT
RETRACTOR ONETRAX LX 90X20 (MISCELLANEOUS) ×1 IMPLANT
SUT MNCRL AB 4-0 PS2 18 (SUTURE) ×4 IMPLANT
SUT MON AB 5-0 PS2 18 (SUTURE) IMPLANT
SUT SILK 2 0 SH (SUTURE) IMPLANT
SUT VIC AB 2-0 SH 27 (SUTURE) ×4
SUT VIC AB 2-0 SH 27XBRD (SUTURE) ×2 IMPLANT
SUT VIC AB 3-0 SH 27 (SUTURE) ×4
SUT VIC AB 3-0 SH 27X BRD (SUTURE) ×2 IMPLANT
SYR CONTROL 10ML LL (SYRINGE) ×2 IMPLANT
TOWEL GREEN STERILE (TOWEL DISPOSABLE) ×2 IMPLANT
TOWEL GREEN STERILE FF (TOWEL DISPOSABLE) ×2 IMPLANT

## 2020-07-15 NOTE — Op Note (Signed)
Preoperative diagnosis: Left breast cancer status post primary chemotherapy Postoperative diagnosis: Same as above Procedure: 1.  Left breast radioactive seed guided lumpectomy 2.  Left intramammary lymph node radioactive seed guided excision 3.  Left targeted axillary lymph node dissection (left deep axillary sentinel lymph node biopsy and a left axillary node radioactive seed guided excision) Surgeon: Dr. Serita Grammes Anesthesia: General with pectoral block Specimens: 1.  Left breast tissue marked with paint containing seed and clip 2.  Additional superior, lateral, posterior margins marked short stitch superior, long stitch lateral, double stitch deep 3.  Left intramammary node excision containing seed and clip 4.  Left deep axillary sentinel lymph nodes and seed with highest count of 712 Complications: None Sponge needle count was correct completion Drains: None Disposition to recovery stable condition  Indications:44 yof who I saw months ago with new breast cancer she noted a left breast mass in February. no discharge. no prior breast history. no family history of breast or ovarian cancer. she underwent evaluation with mm/us. this shows a 3.6x3.4x2.1 cm breast mass laterally with a 8 mm IM Node. there are at least 2 abnormal axillary nodes. the biopsy of the IM node is benign but discordant. the ax node biopsy is positive. 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 MRI initially showed a 6 cm mass with 0.8 cm IM node and 3 abnormal ax nodes. she has undergone primary chemotherapy stopped 30 september which she has tolerated well. 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 repeat mri shows all normal nodes and a 1 cm breast mass. she is here to discuss surgery. she notes mass has gone away. this is done via [employer] interpreter in my office. She was cancelled due to positive covid previously and is now rescheduled again.   Procedure: After informed  consent was obtained the patient first underwent a pectoral block.  She had undergone placement of 3 seeds prior to beginning.  I had all these mammograms available for my review.  She was injected with Lymphoseek in the standard periareolar fashion.  She was given antibiotics.  SCDs were placed.  She was placed under general anesthesia without complication.  She was prepped and draped in the standard sterile surgical fashion.  Surgical timeout was then performed.  I filtrated 5 cc of a mixture of methylene blue dye and saline in the periareolar position.  This was then massaged.  Identified the tumor that was anterior in the upper pole of the breast.  I made a curvilinear incision overlying this as I thought it was close to the skin.  I then used cautery to remove the seed and the surrounding tissue with an attempt to get a clear margin.  I then painted this.  Mammogram confirmed removal of the clip and the seed.  I thought I was close to 3 margins so I remove them.  I then placed clips in the cavity.  This was then closed with 2-0 Vicryl, 3-0 Vicryl, and 4-0 Monocryl.  Glue and Steri-Strips were applied.  I then made an incision in the low axilla.  The IM node was very close to this and I tunneled to it using the lighted retractor.  I then used the neoprobe to remove this radioactive seed and the surrounding tissue.  This was sent off as specimen #3.  I then entered into the axilla.  I removed this radioactive seed as well as the lymph node that it  was associated with.  This was the prior positive node.  This was also a sentinel node.  I then remove the remaining sentinel nodes with the highest count as listed above.  There was no background radioactivity.  Hemostasis was obtained.  I closed the axillary fascia with 2-0 Vicryl.  The skin was then closed with 3-0 Vicryl for Monocryl.  Glue and Steri-Strips were applied.  She tolerated this well.  She was extubated and transferred to the recovery room.

## 2020-07-15 NOTE — Transfer of Care (Signed)
Immediate Anesthesia Transfer of Care Note  Patient: [name]  Procedure(s) Performed: LEFT BREAST LUMPECTOMY WITH RADIOACTIVE SEED AND LEFT AXILLARY SENTINEL LYMPH NODE BIOPSY, LEFT AXILLARY NODE RADIOACTIVE SEED GUIDED EXCISION, LEFT BREAST RADIOACTIVE SEED GUIDED EXCISION IM NODE (Left Breast)  Patient Location: PACU  Anesthesia Type:General  Level of Consciousness: awake, alert  and oriented  Airway & Oxygen Therapy: Patient Spontanous Breathing and Patient connected to face mask oxygen  Post-op Assessment: Report given to RN and Post -op Vital signs reviewed and stable  Post vital signs: Reviewed and stable  Last Vitals:  Vitals Value Taken Time  BP 120/69 07/15/20 1340  Temp 36.9 C 07/15/20 1340  Pulse 90 07/15/20 1345  Resp 20 07/15/20 1345  SpO2 99 % 07/15/20 1345  Vitals shown include unvalidated device data.  Last Pain:  Vitals:   07/15/20 1340  TempSrc:   PainSc: Asleep      Patients Stated Pain Goal: 3 (62/86/38 1771)  Complications: No complications documented.

## 2020-07-15 NOTE — Anesthesia Procedure Notes (Signed)
Anesthesia Regional Block: Pectoralis block   Pre-Anesthetic Checklist: ,, timeout performed, Correct Patient, Correct Site, Correct Laterality, Correct Procedure, Correct Position, site marked, Risks and benefits discussed,  Surgical consent,  Pre-op evaluation,  At surgeon's request and post-op pain management  Laterality: Left  Prep: Maximum Sterile Barrier Precautions used, chloraprep       Needles:  Injection technique: Single-shot  Needle Type: Echogenic Stimulator Needle     Needle Length: 9cm  Needle Gauge: 22     Additional Needles:   Procedures:,,,, ultrasound used (permanent image in chart),,,,  Narrative:  Start time: 07/15/2020 11:35 AM End time: 07/15/2020 11:25 AM Injection made incrementally with aspirations every 5 mL.  Performed by: Personally  Anesthesiologist: Pervis Hocking, DO  Additional Notes: Monitors applied. No increased pain on injection. No increased resistance to injection. Injection made in 5cc increments. Good needle visualization. Patient tolerated procedure well.

## 2020-07-15 NOTE — H&P (Signed)
44 yof who I saw months ago with new breast cancer she noted a left breast mass in February. no discharge. no prior breast history. no family history of breast or ovarian cancer. she underwent evaluation with mm/us. this shows a 3.6x3.4x2.1 cm breast mass laterally with a 8 mm IM Node. there are at least 2 abnormal axillary nodes. the biopsy of the IM node is benign but discordant. the ax node biopsy is positive. 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 MRI initially showed a 6 cm mass with 0.8 cm IM node and 3 abnormal ax nodes. she has undergone primary chemotherapy stopped 30 september which she has tolerated well. 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 repeat mri shows all normal nodes and a 1 cm breast mass. she is here to discuss surgery. she notes mass has gone away. this is done via [employer] interpreter in my office. She was cancelled due to positive covid previously and is now rescheduled again.    Past Surgical History (Chanel Teressa Senter, [place]; 06/15/2020 10:39 AM) Breast Biopsy  Left.  Diagnostic Studies History (Chanel Teressa Senter, [place]; 06/15/2020 10:39 AM) Colonoscopy  never Pap Smear  1-5 years ago  Allergies (Taylor, CMA; 06/15/2020 10:39 AM) No Known Allergies  [01/27/2020]: Allergies Reconciled   Medication History (Chanel Teressa Senter, [place]; 06/15/2020 10:39 AM) No Current Medications Medications Reconciled  Social History (Chanel Teressa Senter, CMA; 06/15/2020 10:39 AM) Caffeine use  Coffee. No alcohol use  No drug use  Tobacco use  Never smoker.  Family History Antonietta Jewel, [place]; 06/15/2020 10:39 AM) First Degree Relatives  No pertinent family history   Pregnancy / Birth History Antonietta Jewel, [place]; 06/15/2020 10:39 AM) Age at menarche  5 years. Gravida  4 Irregular periods  Maternal age  57-35 Para  3  Other Problems (Chanel Teressa Senter, CMA; 06/15/2020 10:39 AM) Back Pain  Breast Cancer  Gastroesophageal Reflux Disease   Lump In Breast    Review of Systems (Chanel Nolan CMA; 06/15/2020 10:39 AM) General Present- Appetite Loss, Fatigue, Fever and Night Sweats. Not Present- Chills, Weight Gain and Weight Loss. Skin Not Present- Change in Wart/Mole, Dryness, Hives, Jaundice, New Lesions, Non-Healing Wounds, Rash and Ulcer. HEENT Not Present- Earache, Hearing Loss, Hoarseness, Nose Bleed, Oral Ulcers, Ringing in the Ears, Seasonal Allergies, Sinus Pain, Sore Throat, Visual Disturbances, Wears glasses/contact lenses and Yellow Eyes. Respiratory Not Present- Bloody sputum, Chronic Cough, Difficulty Breathing, Snoring and Wheezing. Breast Not Present- Breast Mass, Breast Pain, Nipple Discharge and Skin Changes. Cardiovascular Not Present- Chest Pain, Difficulty Breathing Lying Down, Leg Cramps, Palpitations, Rapid Heart Rate, Shortness of Breath and Swelling of Extremities. Gastrointestinal Not Present- Abdominal Pain, Bloating, Bloody Stool, Change in Bowel Habits, Chronic diarrhea, Constipation, Difficulty Swallowing, Excessive gas, Gets full quickly at meals, Hemorrhoids, Indigestion, Nausea, Rectal Pain and Vomiting. Musculoskeletal Present- Back Pain. Not Present- Joint Pain, Joint Stiffness, Muscle Pain, Muscle Weakness and Swelling of Extremities. Neurological Present- Headaches and Trouble walking. Not Present- Decreased Memory, Fainting, Numbness, Seizures, Tingling, Tremor and Weakness. Psychiatric Not Present- Anxiety, Bipolar, Change in Sleep Pattern, Depression, Fearful and Frequent crying. Endocrine Not Present- Cold Intolerance, Excessive Hunger, Hair Changes, Heat Intolerance, Hot flashes and New Diabetes. Hematology Not Present- Blood Thinners, Easy Bruising, Excessive bleeding, Gland problems, HIV and Persistent Infections.  Vitals (Chanel Nolan CMA; 06/15/2020 10:39 AM) 06/15/2020 10:39 AM Weight: 202.13 lb Height: 62in Body Surface Area: 1.92 m Body Mass Index: 36.97 kg/m  Temp.: 97.41F  Pulse: 117 (Regular)  BP: 126/78(Sitting, Left Arm, Standard) Physical Exam Rolm Bookbinder MD; 06/15/2020 11:35 AM) General Mental Status-Alert. Orientation-Oriented X3. Breast Nipples-No Discharge. Breast Lump-No Palpable Breast Mass. Lymphatic Head & Neck General Head & Neck Lymphatics: Bilateral - Description - Normal. Axillary General Axillary Region: Bilateral - Description - Normal. Note: no  adenopathy   Assessment & Plan Rolm Bookbinder MD; 06/15/2020 11:38 AM) BREAST CANCER METASTASIZED TO AXILLARY LYMPH NODE, LEFT (C50.912) Story: Left breast seed guided lumpectomy, left breast IM node seed guided excision (was discordant and recommended), left TAD We discussed a sentinel lymph node biopsy as she does not appear to having lymph node involvement right now. I also discussed a seed guided excision of the prior positive node for prognostics. We discussed up to a 5% risk lifetime of chronic shoulder pain as well as lymphedema associated with a sentinel lymph node biopsy. We discussed the options for treatment of the breast cancer which included lumpectomy versus a mastectomy. We discussed the performance of the lumpectomy with radioactive seed placement. We discussed a 5-10% chance of a positive margin requiring reexcision in the operating room.We discussed mastectomy and the postoperative care for that as well. Mastectomy can be followed by reconstruction. The decision for lumpectomy vs mastectomy has no impact on decision for chemotherapy. Most mastectomy patients will not need radiation therapy. We discussed that there is no difference in her survival whether she undergoes lumpectomy with radiation therapy or antiestrogen therapy versus a mastectomy. There is also no real difference between her recurrence in the breast. her genetics was negative and had a great response to chemo.

## 2020-07-15 NOTE — Discharge Instructions (Signed)
[facility] Office Phone Number [phone]  BREAST BIOPSY/ PARTIAL MASTECTOMY: POST OP INSTRUCTIONS Take 400 mg of ibuprofen every 8 hours or 650 mg tylenol every 6 hours for next 72 hours then as needed. Use ice several times daily also. Always review your discharge instruction sheet given to you by the facility where your surgery was performed.  IF YOU HAVE DISABILITY OR FAMILY LEAVE FORMS, YOU MUST BRING THEM TO THE OFFICE FOR PROCESSING.  DO NOT GIVE THEM TO YOUR DOCTOR.  1. A prescription for pain medication may be given to you upon discharge.  Take your pain medication as prescribed, if needed.  If narcotic pain medicine is not needed, then you may take acetaminophen (Tylenol), naprosyn (Alleve) or ibuprofen (Advil) as needed. 2. Take your usually prescribed medications unless otherwise directed 3. If you need a refill on your pain medication, please contact your pharmacy.  They will contact our office to request authorization.  Prescriptions will not be filled after 5pm or on week-ends. 4. You should eat very light the first 24 hours after surgery, such as soup, crackers, pudding, etc.  Resume your normal diet the day after surgery. 5. Most patients will experience some swelling and bruising in the breast.  Ice packs and a good support bra will help.  Wear the breast binder provided or a sports bra for 72 hours day and night.  After that wear a sports bra during the day until you return to the office. Swelling and bruising can take several days to resolve.  6. It is common to experience some constipation if taking pain medication after surgery.  Increasing fluid intake and taking a stool softener will usually help or prevent this problem from occurring.  A mild laxative (Milk of Magnesia or Miralax) should be taken according to package directions if there are no bowel movements after 48 hours. 7. Unless discharge instructions indicate otherwise, you may remove your bandages 48  hours after surgery and you may shower at that time.  You may have steri-strips (small skin tapes) in place directly over the incision.  These strips should be left on the skin for 7-10 days and will come off on their own.  If your surgeon used skin glue on the incision, you may shower in 24 hours.  The glue will flake off over the next 2-3 weeks.  Any sutures or staples will be removed at the office during your follow-up visit. 8. ACTIVITIES:  You may resume regular daily activities (gradually increasing) beginning the next day.  Wearing a good support bra or sports bra minimizes pain and swelling.  You may have sexual intercourse when it is comfortable. a. You may drive when you no longer are taking prescription pain medication, you can comfortably wear a seatbelt, and you can safely maneuver your car and apply brakes. b. RETURN TO WORK:  ______________________________________________________________________________________ 9. You should see your doctor in the office for a follow-up appointment approximately two weeks after your surgery.  Your doctor's nurse will typically make your follow-up appointment when she calls you with your pathology report.  Expect your pathology report 3-4 business days after your surgery.  You may call to check if you do not hear from us after three days. 10. OTHER INSTRUCTIONS: _______________________________________________________________________________________________ _____________________________________________________________________________________________________________________________________ _____________________________________________________________________________________________________________________________________ _____________________________________________________________________________________________________________________________________  WHEN TO CALL DR Sherryl Valido: 1. Fever over 101.0 2. Nausea and/or vomiting. 3. Extreme swelling or  bruising. 4. Continued bleeding from incision. 5. Increased pain, redness, or drainage from the incision.  The clinic   staff is available to answer your questions during regular business hours.  Please don't hesitate to call and ask to speak to one of the nurses for clinical concerns.  If you have a medical emergency, go to the nearest emergency room or call 911.  A surgeon from [facility] is always on call at the hospital.  For further questions, please visit [email or web address]  

## 2020-07-15 NOTE — Anesthesia Procedure Notes (Signed)
Procedure Name: LMA Insertion Date/Time: 07/15/2020 12:15 PM Performed by: Dorthea Cove, CRNA Pre-anesthesia Checklist: Patient identified, Emergency Drugs available, Suction available and Patient being monitored Patient Re-evaluated:Patient Re-evaluated prior to induction Oxygen Delivery Method: Circle System Utilized Preoxygenation: Pre-oxygenation with 100% oxygen Induction Type: IV induction Ventilation: Mask ventilation without difficulty LMA: LMA inserted LMA Size: 4.0 Number of attempts: 1 Airway Equipment and Method: Bite block Placement Confirmation: positive ETCO2 Tube secured with: Tape Dental Injury: Teeth and Oropharynx as per pre-operative assessment

## 2020-07-15 NOTE — Anesthesia Postprocedure Evaluation (Signed)
Anesthesia Post Note  Patient: [name]  Procedure(s) Performed: LEFT BREAST LUMPECTOMY WITH RADIOACTIVE SEED AND LEFT AXILLARY SENTINEL LYMPH NODE BIOPSY, LEFT AXILLARY NODE RADIOACTIVE SEED GUIDED EXCISION, LEFT BREAST RADIOACTIVE SEED GUIDED EXCISION IM NODE (Left Breast)     Patient location during evaluation: PACU Anesthesia Type: Regional and General Level of consciousness: awake and alert, oriented and patient cooperative Pain management: pain level controlled Vital Signs Assessment: post-procedure vital signs reviewed and stable Respiratory status: spontaneous breathing, nonlabored ventilation and respiratory function stable Cardiovascular status: blood pressure returned to baseline and stable Postop Assessment: no apparent nausea or vomiting Anesthetic complications: no   No complications documented.  Last Vitals:  Vitals:   07/15/20 1410 07/15/20 1425  BP: 122/77 116/76  Pulse: 89 87  Resp: (!) 24 (!) 21  Temp:  36.9 C  SpO2: 99% 99%    Last Pain:  Vitals:   07/15/20 1425  TempSrc:   PainSc: [place]

## 2020-07-16 ENCOUNTER — Encounter (HOSPITAL_COMMUNITY): Payer: Self-pay | Admitting: General Surgery

## 2020-07-20 ENCOUNTER — Encounter: Payer: Self-pay | Admitting: *Deleted

## 2020-07-22 LAB — SURGICAL PATHOLOGY

## 2020-08-04 NOTE — Progress Notes (Signed)
[place]  [phone] [phone]     ID: [name] DOB: 1975/09/18  MR#: [id number]  YWV#:[id number]  Patient Care Team: Rutherford Guys, MD (Inactive) as PCP - General (Family Medicine) Rolm Bookbinder, MD as Consulting Physician (General Surgery) Makenzie Weisner, Virgie Dad, MD as Consulting Physician (Oncology) Eppie Gibson, MD as Attending Physician (Radiation Oncology) Rockwell Germany, RN as Oncology Nurse Navigator Mauro Kaufmann, RN as Oncology Nurse Navigator Chauncey Cruel, MD OTHER MD:  CHIEF COMPLAINT: Triple negative breast cancer  CURRENT TREATMENT: Adjuvant radiation pending   INTERVAL HISTORY:  Annaya returns today for follow up of her triple negative breast cancer.  She is accompanied by a [place] interpreter.  Since her last visit, she underwent breast MRI on 06/11/2020 showing: breast composition B; treatment response with upper-outer left breast malignancy now measuring 1 cm (previously 6 cm); treatment response with previously identified abnormal left axillary lymph nodes now with normal appearance; no evidence of right breast malignancy.  She was scheduled to undergo definitive surgery in 06/2020, but she instead tested positive for Covid on 06/20/2020.  She underwent left lumpectomy on 07/15/2020 under Dr. Donne Hazel. Pathology from the procedure [id number]) showed: invasive ductal carcinoma s/p neoadjuvant treatment, 0.8 cm, grade 3; margins not involved. Prognostic panel was repeated on the final surgical sample. This again showed triple negative disease.  All four biopsied lymph nodes were negative for carcinoma (0/4).   Her port was not removed in light of possible pembrolizumab infusions to follow.  Her case was also reviewed at the 07/29/2020 breast cancer multidisciplinary conference and pembrolizumab plus or minus fluorouracil was recommended.   REVIEW OF SYSTEMS: Tersa is generally quite well with the  surgery.  She says she was very scared and that to give her something to calm down but actually she did fine with no fever no unusual pain no bleeding.  She has a good range of motion of the left upper extremity and no swelling.   COVID 19 VACCINATION STATUS: She had the J&J vaccine x1., infection 06/20/2020   HISTORY OF CURRENT ILLNESS: From the original intake note:  [name] herself palpated a left beast lump with associated pain. She underwent bilateral diagnostic mammography with tomography and left breast ultrasonography at [facility] on 01/23/2020 showing: breast density category B; palpable 3.6 cm left breast mass with associated calcifications at 1 o'clock; 0.8 cm left breast mass at 2 o'clock, concerning for abnormal intramammary lymph node; at least two abnormal left axillary lymph nodes.  Accordingly on 01/23/2020 she proceeded to biopsy of the left breast area in question. The pathology from this procedure ([id number]) showed: invasive ductal carcinoma at 1 o'clock, grade 3. Prognostic indicators significant for: estrogen receptor, 20% positive with moderate staining intensity and progesterone receptor, 0% negative. Proliferation marker Ki67 at 40%. HER2 negative by immunohistochemistry (1+).  The left axillary lymph node confirmed metastatic carcinoma.  The left intramammary lymph node was negative for carcinoma.  The patient's subsequent history is as detailed below.   PAST MEDICAL HISTORY: Past Medical History:  Diagnosis Date  . Allergy   . Asthma   . Breast cancer in female [facility])    Left  . Chronic back pain   . History of breast cancer   . Hx of migraines   . Morbid obesity ([place])     PAST SURGICAL HISTORY: Past Surgical History:  Procedure Laterality Date  . BREAST LUMPECTOMY WITH RADIOACTIVE SEED AND SENTINEL LYMPH NODE  BIOPSY Left 07/15/2020   Procedure: LEFT BREAST LUMPECTOMY WITH RADIOACTIVE SEED AND LEFT AXILLARY SENTINEL LYMPH NODE BIOPSY, LEFT  AXILLARY NODE RADIOACTIVE SEED GUIDED EXCISION, LEFT BREAST RADIOACTIVE SEED GUIDED EXCISION IM NODE;  Surgeon: Emelia Loron, MD;  Location: [facility] OR;  Service: General;  Laterality: Left;  PEC BLOCK  . CESAREAN SECTION WITH BILATERAL TUBAL LIGATION Bilateral 07/23/2015   Procedure: CESAREAN SECTION WITH BILATERAL TUBAL LIGATION;  Surgeon: Reva Bores, MD;  Location: [facility] ORS;  Service: Obstetrics;  Laterality: Bilateral;  . PORTACATH PLACEMENT Right 02/11/2020   Procedure: INSERTION PORT-A-CATH WITH ULTRASOUND GUIDANCE;  Surgeon: Emelia Loron, MD;  Location: [facility];  Service: General;  Laterality: Right;  . TUBAL LIGATION      FAMILY HISTORY: No family history on file.  Her parents are both living as of 01/2020, her father age 62 and her mother age 33. She has three maternal half-siblings (1 sister, 2 brothers) and four paternal half-siblings (1 sister, 3 brothers). There is no family history of cancer to her knowledge.   GYNECOLOGIC HISTORY:  Patient's last menstrual period was 01/20/2020 (exact date). Menarche: 44 years old Age at first live birth: 44 years old GX P 3 LMP 01/2020 Contraceptive s/p tubal ligation HRT n/a  Hysterectomy? no BSO? 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 Husband Feliz Beam works for a Holiday representative, both in [employer]. They are both from [place]. She lives at home with [place] and their three children-- [name], Conway, and [address]. She attends an [employer].    ADVANCED DIRECTIVES: In the absence of any documentation to the contrary, the patient's spouse is their HCPOA.    HEALTH MAINTENANCE: Social History   Tobacco Use  . Smoking status: Never Smoker  . Smokeless tobacco: Never Used  Vaping Use  . Vaping Use: Never used  Substance Use Topics  . Alcohol use: No  . Drug use: No     Colonoscopy: n/a  (age)  PAP: 11/2017, negative (per patient)  Bone density: n/a (age)   Allergies  Allergen Reactions  . Doxorubicin Hcl Rash    Experience chest tightness, abdominal pain, and redness around lips during and shortly after doxorubicin bolus.     Current Outpatient Medications  Medication Sig Dispense Refill  . acetaminophen (TYLENOL) 500 MG tablet Take 500-1,000 mg by mouth every 6 (six) hours as needed for moderate pain or headache.    . albuterol (VENTOLIN HFA) 108 (90 Base) MCG/ACT inhaler Inhale 1-2 puffs into the lungs every 6 (six) hours as needed for wheezing or shortness of breath.    . capecitabine (XELODA) 500 MG tablet Take 2 tablets (1,000 mg total) by mouth 2 (two) times daily after a meal. Take only or radiation treatment days. 84 tablet 0  . ibuprofen (ADVIL) 200 MG tablet Take 200 mg by mouth every 6 (six) hours as needed for headache or moderate pain.    Marland Kitchen oxyCODONE (OXY IR/ROXICODONE) 5 MG immediate release tablet Take 1 tablet (5 mg total) by mouth every 6 (six) hours as needed for moderate pain, severe pain or breakthrough pain. 12 tablet 0   No current facility-administered medications for this visit.    OBJECTIVE: Spanish speaker in no acute distress  Vitals:   08/05/20 1309  BP: 117/71  Pulse: 87  Resp: 18  Temp: (!) 97.3 F (36.3 C)  SpO2: 100%     Body mass  index is 37.13 kg/m.   Wt Readings from Last 3 Encounters:  08/05/20 203 lb (92.1 kg)  07/15/20 201 lb (91.2 kg)  07/07/20 201 lb (91.2 kg)      ECOG FS:1 - Symptomatic but completely ambulatory  Sclerae unicteric, EOMs intact Wearing a mask No cervical or supraclavicular adenopathy Lungs no rales or rhonchi Heart regular rate and rhythm Abd soft, nontender, positive bowel sounds MSK no focal spinal tenderness, no upper extremity lymphedema Neuro: nonfocal, well oriented, appropriate affect Breasts: The right breast is unremarkable.  The left breast is status post recent lumpectomy.  The  cosmetic result is excellent.  There is no dehiscence erythema or swelling.  Both axillae are benign   LAB RESULTS:  CMP     Component Value Date/Time   NA 141 08/05/2020 1244   NA 138 08/15/2017 1525   K 3.8 08/05/2020 1244   CL 105 08/05/2020 1244   CO2 27 08/05/2020 1244   GLUCOSE 115 (H) 08/05/2020 1244   BUN 18 08/05/2020 1244   BUN 12 08/15/2017 1525   CREATININE 0.76 08/05/2020 1244   CREATININE 0.77 02/26/2020 0915   CALCIUM 9.4 08/05/2020 1244   PROT 7.2 08/05/2020 1244   PROT 7.4 01/03/2017 1642   ALBUMIN 3.7 08/05/2020 1244   ALBUMIN 4.3 01/03/2017 1642   AST 23 08/05/2020 1244   AST 17 02/26/2020 0915   ALT 26 08/05/2020 1244   ALT 21 02/26/2020 0915   ALKPHOS 104 08/05/2020 1244   BILITOT 0.3 08/05/2020 1244   BILITOT <0.2 (L) 02/26/2020 0915   GFRNONAA >60 08/05/2020 1244   GFRNONAA >60 02/26/2020 0915   GFRAA >60 06/03/2020 0949   GFRAA >60 02/26/2020 0915    No results found for: TOTALPROTELP, ALBUMINELP, A1GS, A2GS, BETS, BETA2SER, GAMS, MSPIKE, SPEI  Lab Results  Component Value Date   WBC 5.0 08/05/2020   NEUTROABS 2.6 08/05/2020   HGB 11.7 (L) 08/05/2020   HCT 36.6 08/05/2020   MCV 90.8 08/05/2020   PLT 252 08/05/2020    No results found for: LABCA2  No components found for: ELFYBO175  No results for input(s): INR in the last 168 hours.  No results found for: LABCA2  No results found for: ZWC585  No results found for: IDP824  No results found for: MPN361  No results found for: CA2729  No components found for: HGQUANT  No results found for: CEA1 / No results found for: CEA1   No results found for: AFPTUMOR  No results found for: CHROMOGRNA  No results found for: KPAFRELGTCHN, LAMBDASER, KAPLAMBRATIO (kappa/lambda light chains)  No results found for: HGBA, HGBA2QUANT, HGBFQUANT, HGBSQUAN (Hemoglobinopathy evaluation)   No results found for: LDH  No results found for: IRON, TIBC, IRONPCTSAT (Iron and TIBC)  No results  found for: FERRITIN  Urinalysis No results found for: COLORURINE, APPEARANCEUR, LABSPEC, PHURINE, GLUCOSEU, HGBUR, BILIRUBINUR, KETONESUR, PROTEINUR, UROBILINOGEN, NITRITE, LEUKOCYTESUR   STUDIES: NM Sentinel Node Inj-No Rpt (Breast)  Result Date: 07/15/2020 Sulfur colloid was injected by the nuclear medicine technologist for melanoma sentinel node.   MM Breast Surgical Specimen  Result Date: 07/15/2020 CLINICAL DATA:  Left lumpectomy for invasive ductal carcinoma in the 1 o'clock position of the left breast marked with a ribbon shaped clip and surgical excision of a previously biopsied metastatic left axillary lymph node marked with a Q shaped clip and a previously biopsied intramammary lymph node in the 2 o'clock position of the breast marked with a coil shaped clip that was negative for  metastatic disease. EXAM: SPECIMEN RADIOGRAPH OF THE LEFT BREAST X 2 SPECIMEN RADIOGRAPH OF THE LEFT AXILLA COMPARISON:  Previous exam(s). FINDINGS: Status post excision of the left breast and left axilla. The ribbon shaped biopsy marker clip, radioactive seed and small calcifications within an ill-defined irregular mass are present in one breast specimen, completely intact, and were marked for pathology. The coil shaped biopsy marker clip, radioactive seed and a small oval mass are present in the other breast specimen, completely intact, and marked for pathology. The Q shaped biopsy marker clip within an oval mass are present in the axillary specimen, completely intact and more marked for pathology. The left axillary radioactive seed is in the specimen container separate from the specimen. This is also completely intact. IMPRESSION: Specimen radiographs of the left breast and left axilla. Electronically Signed   By: Beckie Salts M.D.   On: 07/15/2020 16:57   MM Breast Surgical Specimen  Result Date: 07/15/2020 CLINICAL DATA:  Left lumpectomy for invasive ductal carcinoma in the 1 o'clock position of the left  breast marked with a ribbon shaped clip and surgical excision of a previously biopsied metastatic left axillary lymph node marked with a Q shaped clip and a previously biopsied intramammary lymph node in the 2 o'clock position of the breast marked with a coil shaped clip that was negative for metastatic disease. EXAM: SPECIMEN RADIOGRAPH OF THE LEFT BREAST X 2 SPECIMEN RADIOGRAPH OF THE LEFT AXILLA COMPARISON:  Previous exam(s). FINDINGS: Status post excision of the left breast and left axilla. The ribbon shaped biopsy marker clip, radioactive seed and small calcifications within an ill-defined irregular mass are present in one breast specimen, completely intact, and were marked for pathology. The coil shaped biopsy marker clip, radioactive seed and a small oval mass are present in the other breast specimen, completely intact, and marked for pathology. The Q shaped biopsy marker clip within an oval mass are present in the axillary specimen, completely intact and more marked for pathology. The left axillary radioactive seed is in the specimen container separate from the specimen. This is also completely intact. IMPRESSION: Specimen radiographs of the left breast and left axilla. Electronically Signed   By: Beckie Salts M.D.   On: 07/15/2020 16:57   MM Breast Surgical Specimen  Result Date: 07/15/2020 CLINICAL DATA:  Left lumpectomy for invasive ductal carcinoma in the 1 o'clock position of the left breast marked with a ribbon shaped clip and surgical excision of a previously biopsied metastatic left axillary lymph node marked with a Q shaped clip and a previously biopsied intramammary lymph node in the 2 o'clock position of the breast marked with a coil shaped clip that was negative for metastatic disease. EXAM: SPECIMEN RADIOGRAPH OF THE LEFT BREAST X 2 SPECIMEN RADIOGRAPH OF THE LEFT AXILLA COMPARISON:  Previous exam(s). FINDINGS: Status post excision of the left breast and left axilla. The ribbon shaped biopsy  marker clip, radioactive seed and small calcifications within an ill-defined irregular mass are present in one breast specimen, completely intact, and were marked for pathology. The coil shaped biopsy marker clip, radioactive seed and a small oval mass are present in the other breast specimen, completely intact, and marked for pathology. The Q shaped biopsy marker clip within an oval mass are present in the axillary specimen, completely intact and more marked for pathology. The left axillary radioactive seed is in the specimen container separate from the specimen. This is also completely intact. IMPRESSION: Specimen radiographs of the left breast and left  axilla. Electronically Signed   By: Claudie Revering M.D.   On: 07/15/2020 16:57   MM LT RADIOACTIVE SEED LOC MAMMO GUIDE  Result Date: 07/14/2020 CLINICAL DATA:  44 year old female presenting for radioactive seed localization of a left breast mass prior to lumpectomy. EXAM: MAMMOGRAPHIC GUIDED RADIOACTIVE SEED LOCALIZATION OF THE LEFT BREAST COMPARISON:  Previous exam(s). FINDINGS: Patient presents for radioactive seed localization prior to left breast lumpectomy. I met with the patient and we discussed the procedure of seed localization including benefits and alternatives. We discussed the high likelihood of a successful procedure. We discussed the risks of the procedure including infection, bleeding, tissue injury and further surgery. We discussed the low dose of radioactivity involved in the procedure. Informed, written consent was given. The usual time-out protocol was performed immediately prior to the procedure. Using mammographic guidance, sterile technique, 1% lidocaine and an I-125 radioactive seed, the ribbon shaped biopsy marking clip at the site of the known malignancy in the upper-outer quadrant of the left breast was localized using a lateral approach. The follow-up mammogram images confirm the seed in the expected location and were marked for Dr.  Donne Hazel. Follow-up survey of the patient confirms presence of the radioactive seed. Order number of I-125 seed:  [id number]. Total activity:  4.782 millicuries reference Date: 06/16/2020 The patient tolerated the procedure well and was released from the [facility]. She was given instructions regarding seed removal. IMPRESSION: Radioactive seed localization left breast. No apparent complications. Electronically Signed   By: Ammie Ferrier M.D.   On: 07/14/2020 11:51   [place] LT RADIOACTIVE SEED LOC  Result Date: 07/14/2020 CLINICAL DATA:  44 year old female presenting for radioactive seed localization of a left intramammary lymph node and a left axillary lymph node. EXAM: ULTRASOUND GUIDED RADIOACTIVE SEED LOCALIZATION OF THE LEFT BREAST COMPARISON:  Previous exam(s). FINDINGS: Patient presents for radioactive seed localization prior to . I met with the patient and we discussed the procedure of seed localization including benefits and alternatives. We discussed the high likelihood of a successful procedure. We discussed the risks of the procedure including infection, bleeding, tissue injury and further surgery. We discussed the low dose of radioactivity involved in the procedure. Informed, written consent was given. The usual time-out protocol was performed immediately prior to the procedure. #1 Using ultrasound guidance, sterile technique, 1% lidocaine and an I-125 radioactive seed, the intramammary lymph node in the left breast at 2 o'clock, 10 cm from the nipple was localized using a lateral approach. The follow-up mammogram images confirm the seed in the expected location and were marked for Dr. Donne Hazel. Follow-up survey of the patient confirms presence of the radioactive seed. Order number of I-125 seed:  [id number]. Total activity:  5.784 millicuries reference Date: 06/16/2020 ---------------------------------------------------------------------------------------------- #2 Using ultrasound guidance,  sterile technique, 1% lidocaine and an I-125 radioactive seed, the left axillary lymph node with the biopsy marking clip was localized using a lateral approach. The follow-up mammogram images confirm the seed in the expected location and were marked for Dr. Donne Hazel. Follow-up survey of the patient confirms presence of the radioactive seed. Order number of I-125 seed:  [id number]. Total activity:  1.324 millicuries reference Date: 06/16/2020 The patient tolerated the procedure well and was released from the [facility]. She was given instructions regarding seed removal. IMPRESSION: 1. Radioactive seed localization of the intramammary lymph node in the left breast at 2 o'clock. No apparent complications. 2. Radioactive seed localization of the left axillary lymph node. No apparent complications. Electronically Signed  By: Ammie Ferrier M.D.   On: 07/14/2020 11:52   [place] LT RADIOACTIVE SEED EA ADD LESION  Result Date: 07/14/2020 CLINICAL DATA:  44 year old female presenting for radioactive seed localization of a left intramammary lymph node and a left axillary lymph node. EXAM: ULTRASOUND GUIDED RADIOACTIVE SEED LOCALIZATION OF THE LEFT BREAST COMPARISON:  Previous exam(s). FINDINGS: Patient presents for radioactive seed localization prior to . I met with the patient and we discussed the procedure of seed localization including benefits and alternatives. We discussed the high likelihood of a successful procedure. We discussed the risks of the procedure including infection, bleeding, tissue injury and further surgery. We discussed the low dose of radioactivity involved in the procedure. Informed, written consent was given. The usual time-out protocol was performed immediately prior to the procedure. #1 Using ultrasound guidance, sterile technique, 1% lidocaine and an I-125 radioactive seed, the intramammary lymph node in the left breast at 2 o'clock, 10 cm from the nipple was localized using a lateral  approach. The follow-up mammogram images confirm the seed in the expected location and were marked for Dr. Donne Hazel. Follow-up survey of the patient confirms presence of the radioactive seed. Order number of I-125 seed:  [id number]. Total activity:  9.323 millicuries reference Date: 06/16/2020 ---------------------------------------------------------------------------------------------- #2 Using ultrasound guidance, sterile technique, 1% lidocaine and an I-125 radioactive seed, the left axillary lymph node with the biopsy marking clip was localized using a lateral approach. The follow-up mammogram images confirm the seed in the expected location and were marked for Dr. Donne Hazel. Follow-up survey of the patient confirms presence of the radioactive seed. Order number of I-125 seed:  [id number]. Total activity:  4.270 millicuries reference Date: 06/16/2020 The patient tolerated the procedure well and was released from the [facility]. She was given instructions regarding seed removal. IMPRESSION: 1. Radioactive seed localization of the intramammary lymph node in the left breast at 2 o'clock. No apparent complications. 2. Radioactive seed localization of the left axillary lymph node. No apparent complications. Electronically Signed   By: Ammie Ferrier M.D.   On: 07/14/2020 11:52     ELIGIBLE FOR AVAILABLE RESEARCH PROTOCOL: [id number]?  ASSESSMENT: 44 y.o. 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  (a) chest CT scan and bone scan 02/07/2020 showed no evidence of metastatic disease  (1) neoadjuvant chemotherapy will consist of doxorubicin and cyclophosphamide in dose dense fashion x4 starting 02/12/2020 to be followed by paclitaxel and carboplatin weekly x12  (a) echo 02/07/2020 shows an ejection fraction in the 55-60% range  (b) Epirubicin substituted  for Doxorubicin secondary to allergic rash from Doxorubicin  (2) status post left lumpectomy and sentinel lymph node sampling 07/15/2020 for a pT1b pN0 residual invasive ductal carcinoma, grade 3, with negative margins.  (a) a total of 4 left axillary lymph nodes were removed  (b) repeat prognostic panel on the final pathology again triple negative, with an MIB-1 of 50%.  (3) adjuvant radiation  (4) genetics testing 02/07/2020 through the [facility] Common Hereditary Cancers Panel found no deleterious mutations in  APC, ATM, AXIN2, BARD1, BMPR1A, BRCA1, BRCA2, BRIP1, CDH1, CDKN2A (p14ARF), CDKN2A (p16INK4a), CKD4, CHEK2, CTNNA1, DICER1, EPCAM (Deletion/duplication testing only), GREM1 (promoter region deletion/duplication testing only), KIT, MEN1, MLH1, MSH2, MSH3, MSH6, MUTYH, NBN, NF1, NHTL1, PALB2, PDGFRA, PMS2, POLD1, POLE, PTEN, RAD50, RAD51C, RAD51D, RNF43, SDHB, SDHC, SDHD, SMAD4, SMARCA4. STK11, TP53, TSC1, TSC2, and  VHL.  The following genes were evaluated for sequence changes only: SDHA and HOXB13 c.251G>A variant only.  (a) VUS in MSH6 called c.2744C>G identified.   (5) adjuvant capecitabine at sensitizing doses to be given during radiation  (6) adjuvant pertuzumab to start after radiation is completed   PLAN: Eola did well with her neoadjuvant chemotherapy and had a good response although not a complete pathologic response.  She is recovering well from her surgery and is ready to start radiation in December.  Today we discussed capecitabine sensitization.  She will take 2 tablets twice daily of that medication, a total of 1000 mg twice daily, on radiation treatment days only.  We discussed the possible toxicities side effects and complications and I wrote all that information down for her in Spanish.  When she completes the radiation treatments we will start pembrolizumab/Keytruda.  Very likely this will be early February or so.  We will continue that for a minimum of a year  She has  a good understanding of the overall plan and is in agreement with it  Total encounter time 35 minutes.Sarajane Jews C. Genisis Sonnier, MD 08/05/20 1:40 PM Medical Oncology and Hematology [facility] [place], [place] [address] Tel. [phone]    Fax. [phone]   I, Wilburn Mylar, am acting as scribe for Dr. Virgie Dad. Lynton Crescenzo.  I, Lurline Del MD, have reviewed the above documentation for accuracy and completeness, and I agree with the above.    *Total Encounter Time as defined by the Centers for Medicare and Medicaid Services includes, in addition to the face-to-face time of a patient visit (documented in the note above) non-face-to-face time: obtaining and reviewing outside history, ordering and reviewing medications, tests or procedures, care coordination (communications with other health care professionals or caregivers) and documentation in the medical record.

## 2020-08-05 ENCOUNTER — Inpatient Hospital Stay: Payer: No Typology Code available for payment source | Attending: Oncology

## 2020-08-05 ENCOUNTER — Inpatient Hospital Stay (HOSPITAL_BASED_OUTPATIENT_CLINIC_OR_DEPARTMENT_OTHER): Payer: Self-pay | Admitting: Oncology

## 2020-08-05 ENCOUNTER — Telehealth: Payer: Self-pay | Admitting: Pharmacist

## 2020-08-05 ENCOUNTER — Other Ambulatory Visit: Payer: Self-pay

## 2020-08-05 ENCOUNTER — Other Ambulatory Visit: Payer: Self-pay | Admitting: Oncology

## 2020-08-05 VITALS — BP 117/71 | HR 87 | Temp 97.3°F | Resp 18 | Ht 62.0 in | Wt 203.0 lb

## 2020-08-05 DIAGNOSIS — C50412 Malignant neoplasm of upper-outer quadrant of left female breast: Secondary | ICD-10-CM

## 2020-08-05 DIAGNOSIS — Z171 Estrogen receptor negative status [ER-]: Secondary | ICD-10-CM | POA: Insufficient documentation

## 2020-08-05 DIAGNOSIS — Z79899 Other long term (current) drug therapy: Secondary | ICD-10-CM | POA: Insufficient documentation

## 2020-08-05 DIAGNOSIS — Z8616 Personal history of COVID-19: Secondary | ICD-10-CM | POA: Insufficient documentation

## 2020-08-05 DIAGNOSIS — Z170 Estrogen receptor positive status [ER+]: Secondary | ICD-10-CM

## 2020-08-05 DIAGNOSIS — C773 Secondary and unspecified malignant neoplasm of axilla and upper limb lymph nodes: Secondary | ICD-10-CM | POA: Insufficient documentation

## 2020-08-05 DIAGNOSIS — Z9221 Personal history of antineoplastic chemotherapy: Secondary | ICD-10-CM | POA: Insufficient documentation

## 2020-08-05 DIAGNOSIS — E669 Obesity, unspecified: Secondary | ICD-10-CM | POA: Insufficient documentation

## 2020-08-05 LAB — COMPREHENSIVE METABOLIC PANEL
ALT: 26 U/L (ref 0–44)
AST: 23 U/L (ref 15–41)
Albumin: 3.7 g/dL (ref 3.5–5.0)
Alkaline Phosphatase: 104 U/L (ref 38–126)
Anion gap: 9 (ref 5–15)
BUN: 18 mg/dL (ref 6–20)
CO2: 27 mmol/L (ref 22–32)
Calcium: 9.4 mg/dL (ref 8.9–10.3)
Chloride: 105 mmol/L (ref 98–111)
Creatinine, Ser: 0.76 mg/dL (ref 0.44–1.00)
GFR, Estimated: >60 mL/min (ref >60)
Glucose, Bld: 115 mg/dL — ABNORMAL HIGH (ref 70–99)
Potassium: 3.8 mmol/L (ref 3.5–5.1)
Sodium: 141 mmol/L (ref 135–145)
Total Bilirubin: 0.3 mg/dL (ref 0.3–1.2)
Total Protein: 7.2 g/dL (ref 6.5–8.1)

## 2020-08-05 LAB — CBC WITH DIFFERENTIAL/PLATELET
Abs Immature Granulocytes: 0.03 10*3/uL (ref 0.00–0.07)
Basophils Absolute: 0 10*3/uL (ref 0.0–0.1)
Basophils Relative: 0 %
Eosinophils Absolute: 0.1 10*3/uL (ref 0.0–0.5)
Eosinophils Relative: 1 %
HCT: 36.6 % (ref 36.0–46.0)
Hemoglobin: 11.7 g/dL — ABNORMAL LOW (ref 12.0–15.0)
Immature Granulocytes: 1 %
Lymphocytes Relative: 35 %
Lymphs Abs: 1.7 10*3/uL (ref 0.7–4.0)
MCH: 29 pg (ref 26.0–34.0)
MCHC: 32 g/dL (ref 30.0–36.0)
MCV: 90.8 fL (ref 80.0–100.0)
Monocytes Absolute: 0.5 10*3/uL (ref 0.1–1.0)
Monocytes Relative: 10 %
Neutro Abs: 2.6 10*3/uL (ref 1.7–7.7)
Neutrophils Relative %: 53 %
Platelets: 252 10*3/uL (ref 150–400)
RBC: 4.03 MIL/uL (ref 3.87–5.11)
RDW: 13.9 % (ref 11.5–15.5)
WBC: 5 10*3/uL (ref 4.0–10.5)
nRBC: 0 % (ref 0.0–0.2)

## 2020-08-05 MED ORDER — CAPECITABINE 500 MG PO TABS: 1000.0000 mg | ORAL_TABLET | Freq: Two times a day (BID) | ORAL | 0 refills | Status: DC

## 2020-08-05 NOTE — Telephone Encounter (Signed)
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  Prescription dose and frequency assessed for appropriateness. Appropriate for therapy initiation.   CBC w/ Diff and CMP from 08/05/20 assessed, labs overall stable for treatment initiation.  Current medication list in Epic reviewed, no relevant/significant DDIs with Xeloda identified.  Evaluated chart and no patient barriers to medication adherence noted.   Prescription has been e-scribed to the [facility] for benefits analysis and approval.  Oral Oncology Clinic will continue to follow for insurance authorization, copayment issues, initial counseling and start date.  Leron Croak, PharmD, BCPS Hematology/Oncology Clinical Pharmacist [facility] [phone] 08/05/2020 2:09 PM

## 2020-08-07 ENCOUNTER — Telehealth: Payer: Self-pay | Admitting: Oncology

## 2020-08-07 NOTE — Telephone Encounter (Signed)
Scheduled appts per 11/24 los. Pt confirmed appt date and time.

## 2020-08-10 ENCOUNTER — Ambulatory Visit: Payer: No Typology Code available for payment source | Attending: Oncology | Admitting: Physical Therapy

## 2020-08-10 ENCOUNTER — Encounter: Payer: Self-pay | Admitting: Physical Therapy

## 2020-08-10 ENCOUNTER — Other Ambulatory Visit: Payer: Self-pay

## 2020-08-10 DIAGNOSIS — R293 Abnormal posture: Secondary | ICD-10-CM | POA: Insufficient documentation

## 2020-08-10 DIAGNOSIS — C50412 Malignant neoplasm of upper-outer quadrant of left female breast: Secondary | ICD-10-CM | POA: Insufficient documentation

## 2020-08-10 DIAGNOSIS — Z483 Aftercare following surgery for neoplasm: Secondary | ICD-10-CM | POA: Insufficient documentation

## 2020-08-10 DIAGNOSIS — Z170 Estrogen receptor positive status [ER+]: Secondary | ICD-10-CM | POA: Insufficient documentation

## 2020-08-10 NOTE — Therapy (Signed)
[place], [place], [address] Phone: [phone]   Fax:  [phone]  Physical Therapy Treatment  Patient Details  Name: [name] MRN: [id number] Date of Birth: 1976-05-09 Referring Provider (PT): Dr. Rolm Bookbinder   Encounter Date: 08/10/2020   PT End of Session - 08/10/20 1355    Visit Number 2    Number of Visits 2    PT Start Time 1304    PT Stop Time 1358    PT Time Calculation (min) 54 min    Activity Tolerance Patient tolerated treatment well    Behavior During Therapy [facility] for tasks assessed/performed           Past Medical History:  Diagnosis Date  . Allergy   . Asthma   . Breast cancer in female [facility])    Left  . Chronic back pain   . History of breast cancer   . Hx of migraines   . Morbid obesity ([place])     Past Surgical History:  Procedure Laterality Date  . BREAST LUMPECTOMY WITH RADIOACTIVE SEED AND SENTINEL LYMPH NODE BIOPSY Left 07/15/2020   Procedure: LEFT BREAST LUMPECTOMY WITH RADIOACTIVE SEED AND LEFT AXILLARY SENTINEL LYMPH NODE BIOPSY, LEFT AXILLARY NODE RADIOACTIVE SEED GUIDED EXCISION, LEFT BREAST RADIOACTIVE SEED GUIDED EXCISION IM NODE;  Surgeon: Rolm Bookbinder, MD;  Location: [place];  Service: General;  Laterality: Left;  PEC BLOCK  . CESAREAN SECTION WITH BILATERAL TUBAL LIGATION Bilateral 07/23/2015   Procedure: CESAREAN SECTION WITH BILATERAL TUBAL LIGATION;  Surgeon: Donnamae Jude, MD;  Location: [facility];  Service: Obstetrics;  Laterality: Bilateral;  . PORTACATH PLACEMENT Right 02/11/2020   Procedure: INSERTION PORT-A-CATH WITH ULTRASOUND GUIDANCE;  Surgeon: Rolm Bookbinder, MD;  Location: [place];  Service: General;  Laterality: Right;  . TUBAL LIGATION      There were no vitals filed for this visit.   Subjective Assessment - 08/10/20 1309    Subjective Patient with interpreter present. She reports she underwent  neoadjuvant chemotherapy from 02/12/2020 - 05/28/2020. 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 She underwent a left lumpectomy and sentinel node biopsy ( 4 negative nodes) on 07/15/2020. She will undergo radiation and anti-estrogen therapy. She reports her neuropathy has resolved and has some limitation in shoulder ROM.    Patient is accompained by: Interpreter    Pertinent History Patient was diagnosed on 01/23/2020 with left grade III invasive ductal carcinoma breast cancer. It is ER positive, PR negative, and HER2 negative with a Ki67 of 40%. She has 2 abnormal appearing lymph nodes and 1 was biopsied and found to be positive at time of diagnosis. Patient with interpreter present. She underwent neoadjuvant chemotherapy from 02/12/2020 - 05/28/2020. 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 She underwent a left lumpectomy and sentinel node biopsy ( 4 negative nodes) on 07/15/2020. She will undergo radiation and anti-estrogen therapy. She reports her neuropathy has resolved and has some limitation in shoulder ROM.    Patient Stated Goals See if my arm is ok    Currently in Pain? Yes    Pain Score 3     Pain Location Axilla    Pain Orientation Left    Pain Descriptors / Indicators Tightness;Aching    Pain Type Surgical pain    Pain Onset 1 to 4 weeks ago    Pain Frequency Intermittent    Aggravating Factors  Reaching overhead  Pain Relieving Factors Rest              [facility] PT Assessment - 08/10/20 0001      Assessment   Medical Diagnosis s/p left lumpectomy and SLNB    Referring Provider (PT) Dr. Rolm Bookbinder    Onset Date/Surgical Date 07/15/20    Hand Dominance Right    Prior Therapy Baselines      Precautions   Precautions Other (comment)    Precaution Comments recent surgery; left arm lymphedema      Restrictions   Weight Bearing Restrictions No      Balance Screen   Has the patient fallen in the past 6 months No    Has the patient had a  decrease in activity level because of a fear of falling?  No    Is the patient reluctant to leave their home because of a fear of falling?  No      Home Social worker Private residence    Living Arrangements Spouse/significant other;Children   Husband, 76, 2, and 93 y.o. girls   Available Help at Discharge Family      Prior Function   Level of [place] Unemployed    Leisure She is not exercising      Cognition   Overall Cognitive Status Within Functional Limits for tasks assessed      Observation/Other Assessments   Observations Incisions appear to be healing well with some scar tissue thickening and edema present. Slightly tender to palpation at incision sites; no sign of infection.      Posture/Postural Control   Posture/Postural Control Postural limitations    Postural Limitations Rounded Shoulders;Forward head      ROM / Strength   AROM / PROM / Strength AROM      AROM   AROM Assessment Site Shoulder    Right/Left Shoulder Left    Left Shoulder Extension 46 Degrees    Left Shoulder Flexion 146 Degrees    Left Shoulder ABduction 152 Degrees    Left Shoulder Internal Rotation 81 Degrees    Left Shoulder External Rotation 90 Degrees             LYMPHEDEMA/ONCOLOGY QUESTIONNAIRE - 08/10/20 0001      Type   Cancer Type Left breast cancer      Surgeries   Lumpectomy Date 07/15/20    Sentinel Lymph Node Biopsy Date 07/15/20    Number Lymph Nodes Removed 4      Treatment   Active Chemotherapy Treatment No    Past Chemotherapy Treatment Yes    Date 05/28/20    Active Radiation Treatment No    Past Radiation Treatment No    Current Hormone Treatment No    Past Hormone Therapy No      What other symptoms do you have   Are you Having Heaviness or Tightness Yes    Are you having Pain Yes    Are you having pitting edema No    Is it Hard or Difficult finding clothes that fit No    Do you have infections No    Is  there Decreased scar mobility Yes    [place] Sign No      Lymphedema Assessments   Lymphedema Assessments Upper extremities      Right Upper Extremity Lymphedema   10 cm Proximal to Olecranon Process 35.2 cm    Olecranon Process 28.8 cm    10 cm Proximal to Ulnar  Styloid Process 27.7 cm    Just Proximal to Ulnar Styloid Process 19.7 cm    Across Hand at [employer] 19.8 cm    At [place] of 2nd Digit 6.4 cm      Left Upper Extremity Lymphedema   10 cm Proximal to Olecranon Process 35.2 cm    Olecranon Process 28.5 cm    10 cm Proximal to Ulnar Styloid Process 26.5 cm    Just Proximal to Ulnar Styloid Process 19.1 cm    Across Hand at [employer] 19.6 cm    At [place] of 2nd Digit 6.4 cm              Quick Dash - 08/10/20 0001    Open a tight or new jar Moderate difficulty    Do heavy household chores (wash walls, wash floors) Moderate difficulty    Carry a shopping bag or briefcase No difficulty    Wash your back Moderate difficulty    Use a knife to cut food No difficulty    Recreational activities in which you take some force or impact through your arm, shoulder, or hand (golf, hammering, tennis) Moderate difficulty    During the past week, to what extent has your arm, shoulder or hand problem interfered with your normal social activities with family, friends, neighbors, or groups? Slightly    During the past week, to what extent has your arm, shoulder or hand problem limited your work or other regular daily activities Not at all    Arm, shoulder, or hand pain. Moderate    Tingling (pins and needles) in your arm, shoulder, or hand Moderate    Difficulty Sleeping Mild difficulty    DASH Score 31.82 %                          PT Education - 08/10/20 1353    Education Details Follow up care; scar massage; HEP    Person(s) Educated Patient    Methods Explanation;Demonstration;Handout    Comprehension Verbalized understanding;Returned demonstration                PT Long Term Goals - 08/10/20 1400      PT LONG TERM GOAL #1   Title Patient will be able to demonstrate she has regained full shoulder ROM and function post operatively compared to baselines.    Time 6    Period Months    Status Achieved                 Plan - 08/10/20 1355    Clinical Impression Statement Patient is healing well s/p left lumpectomy and sentinel node biopsy. She is still recovering from chemotherapy and plans to begin radiation in the next 2 weeks. She will likely have radiation to inlcude her axilla as she had a positive axillary lymph node at the time of her diagnosis. This will increase her risk significantly for lymphedema. Her shoulder ROM is nearly back to baseline, incisions are healing well, and there are currently no signs of lymphedema. She was given information today on lymphedema risk reduction in Spanish, educated on doing her HEP, and scar massage. She does not have other needs for PT at this time. She was also given a chip pack placed between her lateral bra and breast to reduce fibrosis and edema.    PT Treatment/Interventions ADLs/Self Care Home Management;Therapeutic exercise;Patient/family education    PT Next Visit Plan D/C  PT Home Exercise Plan Post op shoulder ROM HEP    Consulted and Agree with Plan of Care Patient           Patient will benefit from skilled therapeutic intervention in order to improve the following deficits and impairments:  Postural dysfunction, Decreased range of motion, Pain, Impaired UE functional use, Decreased knowledge of precautions, Increased edema, Decreased scar mobility  Visit Diagnosis: Malignant neoplasm of upper-outer quadrant of left breast in female, estrogen receptor positive (HCC)  Abnormal posture  Aftercare following surgery for neoplasm     Problem List Patient Active Problem List   Diagnosis Date Noted  . Morbid obesity ([place])   . History of breast cancer   . Port-A-Cath in  place 03/25/2020  . Genetic testing 02/13/2020  . Malignant neoplasm of upper-outer quadrant of left breast in female, estrogen receptor positive ([place]) 01/27/2020  . SOB (shortness of breath) 01/10/2017  . Pregnancy 07/23/2015  . Previous cesarean delivery, antepartum condition or complication 69/79/4801  . Encounter for sterilization 07/08/2015  . Advanced maternal age in multigravida   . Impaired ability to use community resources due to language barrier 07/13/2013   PHYSICAL THERAPY DISCHARGE SUMMARY  Visits from Start of Care: 2  Current functional level related to goals / functional outcomes: Goals met. See above for objective findings.   Remaining deficits: Mild breast edema; scar tissue tightness and fibrosis   Education / Equipment: Lymphedema risk reduction.  Plan: Patient agrees to discharge.  Patient goals were met. Patient is being discharged due to meeting the stated rehab goals.  ?????         Annia Friendly, [place] 08/10/20 2:02 PM  [place], [place], [address] Phone: [phone]   Fax:  [phone]  Name: Mady Oubre MRN: [id number] Date of Birth: August 12, 1976

## 2020-08-10 NOTE — Patient Instructions (Addendum)
            [facility]         [address], [place] [address]         [phone]         Annia Friendly, PT, CLT   After Breast Cancer Class It is recommended you attend the ABC class to be educated on lymphedema risk reduction. This class is free of charge and lasts for 1 hour. It is a 1-time class.  Because you don't speak English, I'll give you the information in Spanish.  Scar massage You can begin gentle scar massage to your incisions using coconut oil. Do this for a few minutes each day, twice a day. Do not use coconut oil on your skin once you begin radiation.   Compression garment It would be good for you to buy a good sports or compression bra that zips up the front. Wearing this will help decrease your swelling   Home exercise Program Begin doing the exercises I gave you so you can get rid of the tightness in your shoulder.   Follow up PT: It is recommended you return every 3 months for the first 3 years following surgery to be assessed on the SOZO machine for an L-Dex score. This helps prevent clinically significant lymphedema in 95% of patients. These follow up screens are 15 minute appointments that you are not billed for. You are scheduled for February 14th at 8:15.  Despus de la clase de cncer de mama Se recomienda que asista a la clase ABC para recibir [employer] reduccin del riesgo de [place]. Esta clase es gratuita y tiene una duracin de 1 hora. Es una clase de una sola vez. Como no habla ingls, le dar la informacin en espaol.  Masaje de cicatrices Puede comenzar un suave masaje de cicatrices en sus incisiones con aceite de coco. Haga esto durante unos minutos [place]. No use aceite de coco en su piel una vez que comience la radiacin.   Prenda de compresin Sera bueno que compraras un buen sujetador deportivo o de compresin que se cierre con Scientist, physiological en la parte  delantera. Usar esto ayudar a disminuir su hinchazn.   Programa de ejercicios en casa Empiece a hacer los ejercicios que le di para que pueda deshacerse de la tensin en su hombro.   PT de seguimiento: Se recomienda que regrese cada 3 meses durante los primeros 3 aos despus de la ciruga para ser [employer] en la mquina SOZO para obtener una puntuacin L-Dex. Esto ayuda a IT consultant significativo en el 95% de los pacientes. Estas pantallas de seguimiento son citas de 15 minutos por las que no se Web designer. Est programado para el 14 de febrero a las 8:15.

## 2020-08-17 NOTE — Progress Notes (Signed)
Radiation Oncology         [phone]) [phone] ________________________________  Name: [name] MRN: [id number]  Date: 08/18/2020  DOB: 04-16-1976  Follow-Up Visit Note - The patient opted for telemedicine to maximize safety during the pandemic.  MyChart video was used; an interpreter was present.   Outpatient  CC: Jacelyn Pi, Lilia Argue, MD  Magrinat, Virgie Dad, MD  Diagnosis:      ICD-10-CM   1. Malignant neoplasm of upper-outer quadrant of left breast in female, estrogen receptor positive ([place])  C50.412 Pregnancy, urine   Z17.0    Cancer Staging Malignant neoplasm of upper-outer quadrant of left breast in female, estrogen receptor positive ([place]) Staging form: Breast, AJCC 8th Edition - Clinical stage from 01/29/2020: Stage IIIA (cT2, cN1, cM0, G3, ER+, PR-, HER2-) - Unsigned  [id number], ypN0  CHIEF COMPLAINT: Here to discuss management of left breast cancer  Narrative:  The patient returns today for follow-up. She was seen in the multidisciplinary breast conference on 01/29/2020, during which time it was recommended that she proceed with  staging scans, neoadjuvant chemotherapy, breast conserving surgery, and radiotherapy.   Since consultation date, she underwent the following imaging (dates and results as follows): 1. CT scan of chest on 02/07/2020 showed a dominant 3.7 cm mass in the upper outer left breast that likely corresponded to the patient's newly diagnosed breast cancer. Small nodules/nodes in the lateral left breast were indeterminate. However, there was a dominant 2.5 cm left axillary node that was compatible with nodal metastases. Additionally left axillary nodes and 7 mm short axis left subpectoral node were also suspicious. There was no evidence of distant metastases, nor were there any suspicious osseous lesions.  2. Bone scan on 02/07/2020 did not show any scintigraphic evidence of skeletal metastasis.  3. MRI of bilateral breasts on 02/13/2020 showed the  biopsy-proven malignancy in the upper outer left breast that measured 6.0 x 3.1 x 2.3 cm. There were no additional enhancing masses or abnormal areas of enhancement identified in the left breast to suggest additional sites of disease. The previously biopsied 0.8 cm intramammary lymph node in the upper outer left breast remained suspicious for metastatic disease despite benign pathology results. Finally, there were three morphologically abnormal level 1 axillary lymph nodes, the largest of which was previously biopsied and measured up to 3 cm. There was no MRI evidence of malignancy in the right breast. 4. MRI of bilateral breasts on 06/11/2020 showed treatment response with the upper outer left breast malignancy measuring 1.0 x 0.8 x 1.0 cm, previously 6.0 x 3.1 x 2.3 cm. Treatment response with previously identified abnormal left axillary lymph nodes then had a normal appearance. There was no MRI evidence of right breast malignancy.  Systemic therapy, if applicable, involved (dates and therapy as follows): Neoadjuvant chemotherapy with Doxorubicin and Cyclophosphamide in dose dense fashion x4 beginning on 02/12/2020. Epirubicin was substituted for Doxorubicin secondary to allergic rash.  Breast/nodal surgery on the date of 07/15/2020 revealed: tumor size of 0.8 cm; histology of invasive ductal carcinoma; margin status to invasive disease of 3 mm to the posterior, lateral, and inferior margins; nodal status of negative (0/4); ER status: 20% moderate; PR status: 0% negative; Her2 status: negative; Grade: 3.  Symptomatically, the patient reports: Reports generalized body aches. She is also experiencing some redness and tenderness around lumpectomy incision  SAFETY ISSUES:  Prior radiation? No  Pacemaker/ICD? No  Possible current pregnancy? No  Is the patient on methotrexate? No  Current Complaints /  other details: Medical oncology has prescribed Xeloda for her to take with radiation therapy  She  received the [facility] vaccine against Covid in May and has not yet received a booster        ALLERGIES:  is allergic to doxorubicin hcl.  Meds: Current Outpatient Medications  Medication Sig Dispense Refill  . acetaminophen (TYLENOL) 500 MG tablet Take 500-1,000 mg by mouth every 6 (six) hours as needed for moderate pain or headache.    . albuterol (VENTOLIN HFA) 108 (90 Base) MCG/ACT inhaler Inhale 1-2 puffs into the lungs every 6 (six) hours as needed for wheezing or shortness of breath.    . capecitabine (XELODA) 500 MG tablet Take 2 tablets (1,000 mg total) by mouth 2 (two) times daily after a meal. Take only or radiation treatment days. 84 tablet 0  . ibuprofen (ADVIL) 200 MG tablet Take 200 mg by mouth every 6 (six) hours as needed for headache or moderate pain.    Marland Kitchen oxyCODONE (OXY IR/ROXICODONE) 5 MG immediate release tablet Take 1 tablet (5 mg total) by mouth every 6 (six) hours as needed for moderate pain, severe pain or breakthrough pain. 12 tablet 0   No current facility-administered medications for this encounter.    Physical Findings:  vitals were not taken for this visit. .     General: Alert and oriented, in no acute distress  Lab Findings: Lab Results  Component Value Date   WBC 5.0 08/05/2020   HGB 11.7 (L) 08/05/2020   HCT 36.6 08/05/2020   MCV 90.8 08/05/2020   PLT 252 08/05/2020       Radiographic Findings:  as above   Impression/Plan: Left breast cancer  We discussed adjuvant radiotherapy today.  I recommend radiation therapy to the left breast and regional nodes in order to reduce risk of local regional recurrence by two thirds.  I reviewed the logistics, benefits, risks, and potential side effects of this treatment in detail. Risks may include but not necessary be limited to acute and late injury tissue in the radiation fields such as skin irritation (change in color/pigmentation, itching, dryness, pain, peeling). She may experience fatigue. We  also discussed possible risk of long term cosmetic changes or scar tissue. There is also a smaller risk for lung toxicity, cardiac toxicity, brachial plexopathy, lymphedema, musculoskeletal changes, rib fragility or induction of a second malignancy, late chronic non-healing soft tissue wound.    The patient asked good questions which I answered to her satisfaction. She is enthusiastic about proceeding with treatment.  She will come back later this week for treatment planning.   We discussed measures to reduce the risk of infection during the COVID-19 pandemic.  She is due for her booster shot.  She understands she is eligible for the [place] vaccine if she wishes.  She would like to proceed with that we will arrange for her booster shot to be given when we see her at the cancer center for treatment planning later this week.   This encounter was provided by telemedicine platform; patient desired telemedicine during pandemic precautions.  MyChart video, in addition to the assistance of an interpreter, was used. The patient has given verbal consent for this type of encounter and has been advised to only accept a meeting of this type in a secure network environment. On date of service, in total, I spent 30 minutes on this encounter.   The attendants for this meeting include Eppie Gibson  and [name] During  the encounter, Eppie Gibson was located at [facility] Radiation Oncology Department.  [name] was located at home.   _____________________________________   Eppie Gibson, MD  This document serves as a record of services personally performed by Eppie Gibson, MD. It was created on his behalf by Clerance Lav, a trained medical scribe. The creation of this record is based on the scribe's personal observations and the provider's statements to them. This document has been checked and approved by the attending provider.

## 2020-08-17 NOTE — Progress Notes (Signed)
Location of Breast Cancer: Malignant neoplasm of upper-outer quadrant of LEFT breast, estrogen receptor negative  Histology per Pathology Report: 07/15/2020 FINAL MICROSCOPIC DIAGNOSIS:  A. 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  - Margins of resection are not involved (Closest margins: 3 mm, posterior, lateral and inferior).  - Biopsy clip site.  - See oncology table.  B. INTRAMAMMARY LYMPH NODE, LEFT, BIOPSY:  - One lymph node, negative for carcinoma (0/1).  - Biopsy clip site.  C. SENTINEL LYMPH NODE, LEFT AXILLARY, BIOPSY:  - One lymph node, negative for carcinoma (0/1).  - Biopsy clip site.  D. SENTINEL LYMPH NODE, LEFT AXILLARY, BIOPSY:  - One lymph node, negative for carcinoma (0/1).  E. SENTINEL LYMPH NODE, LEFT AXILLARY, BIOPSY:  - One lymph node, negative for carcinoma (0/1).  F. BREAST, LEFT, ADDITIONAL POSTERIOR MARGIN, EXCISION:  - Breast tissue, negative for carcinoma.  G. BREAST, LEFT, ADDITIONAL SUPERIOR MARGIN, EXCISION:  - Breast tissue, negative for carcinoma.  H. BREAST, LEFT, ADDITIONAL LATERAL MARGIN, EXCISION:  - Breast tissue, negative for carcinoma  Receptor Status: ER(NEGATIVE--Prognostic panel was repeated on the final surgical sample), PR (NEGATIVE), Her2-neu (NEGATIVE), Ki-67(40%)   Did patient present with symptoms (if so, please note symptoms) or was this found on screening mammography?:  Patient palpated a left beast lump with associated pain. She underwent bilateral diagnostic mammography with tomography and left breast ultrasonography at [facility] on 01/23/2020 showing: breast density category B; palpable 3.6 cm left breast mass with associated calcifications at 1 o'clock; 0.8 cm left breast mass at 2 o'clock, concerning for abnormal intramammary lymph node; at least two abnormal left axillary lymph nodes.  Past/Anticipated interventions by surgeon, if any: 07/15/2020 Dr. Rolm Bookbinder 1.  Left breast radioactive seed guided lumpectomy 2.  Left intramammary lymph node radioactive seed guided excision 3.  Left targeted axillary lymph node dissection (left deep axillary sentinel lymph node biopsy and a left axillary node radioactive seed guided excision)  Past/Anticipated interventions by medical oncology, if any:  Under care of Dr. Sarajane Jews Magrinat 08/05/2020 (1) neoadjuvant chemotherapy will consist of doxorubicin and cyclophosphamide in dose dense fashion x4 starting 02/12/2020 to be followed by paclitaxel and carboplatin weekly x12 (final cycle 05/28/2020)             (a) echo 02/07/2020 shows an ejection fraction in the 55-60% range             (b) Epirubicin substituted for Doxorubicin secondary to allergic rash from Doxorubicin (2) status post left lumpectomy and sentinel lymph node sampling 07/15/2020 for a pT1b pN0 residual invasive ductal carcinoma, grade 3, with negative margins.             (a) a total of 4 left axillary lymph nodes were removed             (b) repeat prognostic panel on the final pathology again triple negative, with an MIB-1 of 50%. (3) adjuvant radiation (4) genetics testing 02/07/2020 through the [facility] Common Hereditary Cancers Panel found no deleterious mutations in  APC, ATM, AXIN2, BARD1, BMPR1A, BRCA1, BRCA2, BRIP1, CDH1, CDKN2A (p14ARF), CDKN2A (p16INK4a), CKD4, CHEK2, CTNNA1, DICER1, EPCAM (Deletion/duplication testing only), GREM1 (promoter region deletion/duplication testing only), KIT, MEN1, MLH1, MSH2, MSH3, MSH6, MUTYH, NBN, NF1, NHTL1, PALB2, PDGFRA, PMS2, POLD1, POLE, PTEN, RAD50, RAD51C, RAD51D, RNF43, SDHB, SDHC, SDHD, SMAD4, SMARCA4. STK11, TP53, TSC1, TSC2, and VHL. The following genes were evaluated for sequence changes only: SDHA and HOXB13 c.251G>A variant only.             (  a) VUS in MSH6 called c.2744C>G identified.  (5) adjuvant capecitabine at sensitizing doses to be given during radiation (6) adjuvant pertuzumab  to start after radiation is completed  Lymphedema issues, if any:  Patient denies    Pain issues, if any:  Reports generalized body aches. She is also experiencing some redness and tenderness around lumpectomy insision  SAFETY ISSUES:  Prior radiation? No  Pacemaker/ICD? No  Possible current pregnancy? No  Is the patient on methotrexate? No  Current Complaints / other details:  Nothing of note

## 2020-08-18 ENCOUNTER — Encounter: Payer: Self-pay | Admitting: Radiation Oncology

## 2020-08-18 ENCOUNTER — Ambulatory Visit
Admission: RE | Admit: 2020-08-18 | Discharge: 2020-08-18 | Disposition: A | Discharge status: Home / Self Care | Payer: Self-pay | Source: Ambulatory Visit | Attending: Radiation Oncology | Admitting: Radiation Oncology

## 2020-08-18 ENCOUNTER — Telehealth: Payer: Self-pay | Admitting: *Deleted

## 2020-08-18 DIAGNOSIS — C50412 Malignant neoplasm of upper-outer quadrant of left female breast: Secondary | ICD-10-CM

## 2020-08-18 DIAGNOSIS — Z170 Estrogen receptor positive status [ER+]: Secondary | ICD-10-CM

## 2020-08-18 NOTE — Telephone Encounter (Signed)
CALLED PATIENT TO ASK ABOUT COMING IN FOR LABS ON 08-19-20 @ 12:30 PM @ [facility] , PATIENT AGREED TO DO SO

## 2020-08-19 ENCOUNTER — Ambulatory Visit: Payer: No Typology Code available for payment source

## 2020-08-19 ENCOUNTER — Ambulatory Visit
Admission: RE | Admit: 2020-08-19 | Discharge: 2020-08-19 | Disposition: A | Discharge status: Home / Self Care | Payer: No Typology Code available for payment source | Source: Ambulatory Visit | Attending: Radiation Oncology | Admitting: Radiation Oncology

## 2020-08-19 ENCOUNTER — Other Ambulatory Visit: Payer: Self-pay

## 2020-08-19 ENCOUNTER — Inpatient Hospital Stay: Payer: No Typology Code available for payment source | Attending: Oncology

## 2020-08-19 DIAGNOSIS — C50412 Malignant neoplasm of upper-outer quadrant of left female breast: Secondary | ICD-10-CM | POA: Insufficient documentation

## 2020-08-19 DIAGNOSIS — Z170 Estrogen receptor positive status [ER+]: Secondary | ICD-10-CM | POA: Insufficient documentation

## 2020-08-19 DIAGNOSIS — Z452 Encounter for adjustment and management of vascular access device: Secondary | ICD-10-CM | POA: Insufficient documentation

## 2020-08-19 DIAGNOSIS — Z510 Encounter for antineoplastic radiation therapy: Secondary | ICD-10-CM | POA: Insufficient documentation

## 2020-08-19 DIAGNOSIS — Z23 Encounter for immunization: Secondary | ICD-10-CM | POA: Insufficient documentation

## 2020-08-19 LAB — PREGNANCY, URINE: Preg Test, Ur: NEGATIVE

## 2020-08-19 NOTE — Progress Notes (Signed)
   Covid-19 Vaccination Clinic  Name:  [name]    MRN: [id number] DOB: 1976-05-21  08/19/2020  [name] was observed post Covid-19 immunization for 15 minutes without incident. She was provided with Vaccine Information Sheet and instruction to access the V-Safe system.   [name] was instructed to call 911 with any severe reactions post vaccine: Marland Kitchen Difficulty breathing  . Swelling of face and throat  . A fast heartbeat  . A bad rash all over body  . Dizziness and weakness   Immunizations Administered    Name Date Dose VIS Date Route   Pfizer COVID-19 Vaccine 08/19/2020  2:10 PM 0.3 mL 07/01/2020 Intramuscular   Manufacturer: [place]   Lot: [id number]   [place]: [id number]

## 2020-08-20 NOTE — Telephone Encounter (Signed)
Oral Chemotherapy Pharmacist Encounter   Attempted to reach patient to provide update and offer for initial counseling on oral medication: Xeloda (capecitabine).   No answer. Left voicemail for patient to call back to discuss details of medication acquisition and initial counseling session.  Leron Croak, PharmD, BCPS Hematology/Oncology Clinical Pharmacist [facility] [phone] 08/20/2020 10:35 AM

## 2020-08-21 ENCOUNTER — Encounter: Payer: Self-pay | Admitting: Radiation Oncology

## 2020-08-21 NOTE — Telephone Encounter (Signed)
Oral Chemotherapy Pharmacist Encounter  I spoke with patient with interpreter from [place] interpreters (ID [phone]) for overview of: Xeloda for the treatment of stage IIIC triple negative breast cancer in conjunction with radiation, planned for duration of radiation therapy.   Counseled patient on administration, dosing, side effects, monitoring, drug-food interactions, safe handling, storage, and disposal.  Patient will take Xeloda 500mg  tablets, 2 tablets (1000mg ) by mouth in AM and 2 tabs (1000mg ) by mouth in PM, within 30 minutes of finishing meals, on days of radiation only.  Xeloda and radiation start date: 08/26/20  Adverse effects of Xeloda include but are not limited to: fatigue, decreased blood counts, GI upset, diarrhea, mouth sores, and hand-foot syndrome.  Patient will obtain anti diarrheal and alert the office of 4 or more loose stools above baseline.  Noted baseline pregnancy test that was obtained 08/19/20 is negative.   Reviewed with patient importance of keeping a medication schedule and plan for any missed doses. No barriers to medication adherence identified.  Medication reconciliation performed and medication/allergy list updated.  Insurance authorization for Xeloda has been obtained. Test claim at the pharmacy revealed copayment $0 for 1st fill of Xeloda. Patient will pick up medication from the [facility] on 08/22/20.  Patient informed the pharmacy will reach out 5-7 days prior to needing next fill of Xeloda to coordinate continued medication acquisition to prevent break in therapy.  All questions answered.  Ms. Ladell Pier voiced understanding and appreciation.   Medication education handout placed in mail for patient. Patient knows to call the office with questions or concerns. Oral Chemotherapy Clinic phone number provided to patient.   Leron Croak, PharmD, BCPS Hematology/Oncology Clinical Pharmacist [facility] [phone] 08/21/2020 3:04 PM

## 2020-08-21 NOTE — Telephone Encounter (Signed)
Oral Chemotherapy Pharmacist Encounter   Attempted to reach patient to provide update and offer for initial counseling on oral medication: Xeloda (capecitabine).   No answer. Left voicemail for patient to call back to discuss details of medication acquisition and initial counseling session.  Leron Croak, PharmD, BCPS Hematology/Oncology Clinical Pharmacist [facility] [phone] 08/21/2020 11:58 AM

## 2020-08-25 ENCOUNTER — Encounter: Payer: Self-pay | Admitting: *Deleted

## 2020-08-25 ENCOUNTER — Inpatient Hospital Stay: Payer: No Typology Code available for payment source

## 2020-08-26 ENCOUNTER — Other Ambulatory Visit: Payer: Self-pay

## 2020-08-26 ENCOUNTER — Inpatient Hospital Stay: Payer: Self-pay

## 2020-08-26 ENCOUNTER — Ambulatory Visit
Admission: RE | Admit: 2020-08-26 | Discharge: 2020-08-26 | Disposition: A | Discharge status: Home / Self Care | Payer: No Typology Code available for payment source | Source: Ambulatory Visit | Attending: Radiation Oncology | Admitting: Radiation Oncology

## 2020-08-26 DIAGNOSIS — Z95828 Presence of other vascular implants and grafts: Secondary | ICD-10-CM

## 2020-08-26 MED ORDER — HEPARIN SOD (PORK) LOCK FLUSH 100 UNIT/ML IV SOLN
500.0000 [IU] | Freq: Once | INTRAVENOUS | Status: AC
  Administered 2020-08-26: 14:00:00 500 [IU]
  Filled 2020-08-26: qty 5

## 2020-08-26 MED ORDER — SODIUM CHLORIDE 0.9% FLUSH
10.0000 mL | Freq: Once | INTRAVENOUS | Status: AC
  Administered 2020-08-26: 14:00:00 10 mL
  Filled 2020-08-26: qty 10

## 2020-08-27 ENCOUNTER — Ambulatory Visit
Admission: RE | Admit: 2020-08-27 | Discharge: 2020-08-27 | Disposition: A | Discharge status: Home / Self Care | Payer: No Typology Code available for payment source | Source: Ambulatory Visit | Attending: Radiation Oncology | Admitting: Radiation Oncology

## 2020-08-27 ENCOUNTER — Other Ambulatory Visit: Payer: Self-pay

## 2020-08-28 ENCOUNTER — Ambulatory Visit
Admission: RE | Admit: 2020-08-28 | Discharge: 2020-08-28 | Disposition: A | Discharge status: Home / Self Care | Payer: No Typology Code available for payment source | Source: Ambulatory Visit | Attending: Radiation Oncology | Admitting: Radiation Oncology

## 2020-08-28 ENCOUNTER — Other Ambulatory Visit: Payer: Self-pay | Admitting: Oncology

## 2020-08-28 MED ORDER — CEPHALEXIN 500 MG PO CAPS: 500.0000 mg | ORAL_CAPSULE | Freq: Two times a day (BID) | ORAL | 0 refills | Status: DC

## 2020-08-28 NOTE — Progress Notes (Unsigned)
[name] stopped in today to let us know that she had a painful area in the left chest wall that she wanted [place] to see.  There was also a "knot" under the incision in her axilla.  Area under the incision feels like small area of scarring and it is of no consequence.  The area of redness is imaged below.  It is very tender to palpation superficially.  There is no vesiculation.  I have to believe that this is a form of cellulitis.  She says it has been present since her surgery.  I am going to treat her with cephalexin starting today.  We are also stopping the Xeloda since she says that is giving her strange headaches.  She is continuing radiation daily next week.  I have asked her to stop in in the middle of the week without an appointment just to show me how this area is getting along.

## 2020-08-31 ENCOUNTER — Ambulatory Visit
Admission: RE | Admit: 2020-08-31 | Discharge: 2020-08-31 | Disposition: A | Discharge status: Home / Self Care | Payer: No Typology Code available for payment source | Source: Ambulatory Visit | Attending: Radiation Oncology | Admitting: Radiation Oncology

## 2020-08-31 ENCOUNTER — Other Ambulatory Visit: Payer: Self-pay

## 2020-08-31 DIAGNOSIS — C50412 Malignant neoplasm of upper-outer quadrant of left female breast: Secondary | ICD-10-CM

## 2020-08-31 MED ORDER — RADIAPLEXRX EX GEL: Freq: Once | CUTANEOUS | Status: AC

## 2020-08-31 MED ORDER — ALRA NON-METALLIC DEODORANT (RAD-ONC)
1.0000 "application " | Freq: Once | TOPICAL | Status: AC
  Administered 2020-08-31: 1 via TOPICAL

## 2020-08-31 NOTE — Progress Notes (Signed)
Pt here for patient teaching.    Pt given Radiation and You booklet, skin care instructions, Alra deodorant and Radiaplex gel.    Reviewed areas of pertinence such as fatigue, hair loss, skin changes, breast tenderness and breast swelling .   Pt able to give teach back of to pat skin and use unscented/gentle soap,apply Radiaplex bid, avoid applying anything to skin within 4 hours of treatment, avoid wearing an under wire bra and to use an electric razor if they must shave.   Pt demonstrated understanding of information given and will contact nursing with any questions or concerns.    [email or web address]         

## 2020-09-01 ENCOUNTER — Ambulatory Visit
Admission: RE | Admit: 2020-09-01 | Discharge: 2020-09-01 | Disposition: A | Discharge status: Home / Self Care | Payer: No Typology Code available for payment source | Source: Ambulatory Visit | Attending: Radiation Oncology | Admitting: Radiation Oncology

## 2020-09-02 ENCOUNTER — Ambulatory Visit
Admission: RE | Admit: 2020-09-02 | Discharge: 2020-09-02 | Disposition: A | Discharge status: Home / Self Care | Payer: No Typology Code available for payment source | Source: Ambulatory Visit | Attending: Radiation Oncology | Admitting: Radiation Oncology

## 2020-09-03 ENCOUNTER — Ambulatory Visit
Admission: RE | Admit: 2020-09-03 | Discharge: 2020-09-03 | Disposition: A | Discharge status: Home / Self Care | Payer: No Typology Code available for payment source | Source: Ambulatory Visit | Attending: Radiation Oncology | Admitting: Radiation Oncology

## 2020-09-03 ENCOUNTER — Ambulatory Visit (HOSPITAL_BASED_OUTPATIENT_CLINIC_OR_DEPARTMENT_OTHER): Payer: No Typology Code available for payment source | Admitting: Medical

## 2020-09-03 DIAGNOSIS — Z170 Estrogen receptor positive status [ER+]: Secondary | ICD-10-CM

## 2020-09-03 DIAGNOSIS — L03313 Cellulitis of chest wall: Secondary | ICD-10-CM

## 2020-09-03 DIAGNOSIS — C50412 Malignant neoplasm of upper-outer quadrant of left female breast: Secondary | ICD-10-CM

## 2020-09-03 MED ORDER — PREDNISONE 5 MG PO TABS: ORAL_TABLET | ORAL | 0 refills | Status: DC

## 2020-09-03 MED ORDER — SULFAMETHOXAZOLE-TRIMETHOPRIM 800-160 MG PO TABS: 1.0000 | ORAL_TABLET | Freq: Two times a day (BID) | ORAL | 0 refills | Status: DC

## 2020-09-07 ENCOUNTER — Ambulatory Visit
Admission: RE | Admit: 2020-09-07 | Discharge: 2020-09-07 | Disposition: A | Discharge status: Home / Self Care | Payer: No Typology Code available for payment source | Source: Ambulatory Visit | Attending: Radiation Oncology | Admitting: Radiation Oncology

## 2020-09-07 NOTE — Progress Notes (Signed)
Symptoms Management Clinic Progress Note   [name] [id number] 1975/09/16 44 y.o.  Darlin Priestly is managed by Dr. Darnelle Catalan  Actively treated with chemotherapy/immunotherapy/hormonal therapy: yes  Current therapy: Concurrent chemoradiation with Xeloda  Next scheduled appointment with provider: 10/08/2020 Assessment: Plan:    Cellulitis of chest wall - Plan: sulfamethoxazole-trimethoprim (BACTRIM DS) 800-160 MG tablet, predniSONE (DELTASONE) 5 MG tablet  Malignant neoplasm of upper-outer quadrant of left breast in female, estrogen receptor positive (HCC)   Cellulitis of the right chest wall: Patient was told to stop Keflex and was placed on Bactrim DS p.o. twice daily x7 days. She was also given a prescription for a prednisone taper as it appears there is some allergic reaction along her left chest wall also.  Triple negative invasive ductal carcinoma of the left breast: The patient is currently being treated with radiation therapy and Xeloda. Plans are to begin pembrolizumab/Keytruda after her radiation therapy is completed. She is scheduled to see Dr. Darnelle Catalan in follow-up on 10/08/2020.  Please see After Visit Summary for patient specific instructions.  Future Appointments  Date Time Provider Department Center  09/09/2020 11:00 AM [facility] LINAC 3 [facility] None  09/10/2020  3:15 PM [facility] LINAC 3 [facility] None  09/14/2020 11:00 AM [facility] [id number] [facility] None  09/15/2020 11:00 AM [facility] [id number] [facility] None  09/16/2020 11:00 AM [facility] [id number] [facility] None  09/17/2020 11:00 AM [facility] [id number] [facility] None  09/18/2020 11:00 AM [facility] LINAC 4 [facility] None  09/21/2020 11:00 AM [facility] [id number] [facility] None  09/22/2020 11:00 AM [facility] [id number] [facility] None  09/23/2020 11:00 AM [facility] LINAC 4 [facility] None  09/24/2020 11:00 AM [facility] [id number] [facility] None  09/25/2020 11:00  AM [facility] [id number] [facility] None  09/28/2020 11:00 AM [facility] [id number] [facility] None  09/28/2020 11:15 AM Lonie Peak, MD [facility] None  09/29/2020 11:00 AM [facility] [id number] [facility] None  09/30/2020 11:00 AM [facility] [id number] [facility] None  10/01/2020 11:00 AM [facility] [id number] [facility] None  10/02/2020 11:00 AM [facility] [id number] [facility] None  10/05/2020 11:00 AM Lonie Peak, MD [facility] None  10/06/2020 11:00 AM [facility] [id number] [facility] None  10/07/2020 11:00 AM [facility] [id number] [facility] None  10/08/2020 11:00 AM [facility] [id number] [facility] None  10/08/2020 11:15 AM [facility] LAB [facility] None  10/08/2020 11:30 AM [facility] FLUSH [facility] None  10/08/2020 12:00 PM Magrinat, Valentino Hue, MD [facility] None  10/09/2020 11:00 AM [facility] [id number] [facility] None  10/26/2020  8:15 AM Lorette Ang, Raenette Rover, PTA [facility] None    No orders of the defined types were placed in this encounter.      Subjective:   Patient ID:  [name] is a 44 y.o. (DOB 03/12/1976) female.  Chief Complaint: No chief complaint on file.   HPI [name]  is a 44 y.o. female with a diagnosis of a triple negative invasive ductal carcinoma of the left breast. She is managed by Dr. Darnelle Catalan and is currently being treated with radiation therapy and Xeloda. She has recently been treated for cellulitis over her left chest wall. She was given a prescription for Keflex and has a "couple"  of days left of this therapy. She continues to have redness and warmth along her left chest wall. She denies fevers, chills, sweats, trauma, or changes in activity. She is seen today with a Spanish interpreter.  Medications: I have reviewed the patient's current medications.  Allergies:  Allergies  Allergen Reactions   Doxorubicin Hcl Rash    Experience chest tightness, abdominal  pain, and redness around lips during and shortly  after doxorubicin bolus.     Past Medical History:  Diagnosis Date   Allergy    Asthma    Breast cancer in female [facility])    Left   Chronic back pain    History of breast cancer    Hx of migraines    Morbid obesity (HCC)     Past Surgical History:  Procedure Laterality Date   BREAST LUMPECTOMY WITH RADIOACTIVE SEED AND SENTINEL LYMPH NODE BIOPSY Left 07/15/2020   Procedure: LEFT BREAST LUMPECTOMY WITH RADIOACTIVE SEED AND LEFT AXILLARY SENTINEL LYMPH NODE BIOPSY, LEFT AXILLARY NODE RADIOACTIVE SEED GUIDED EXCISION, LEFT BREAST RADIOACTIVE SEED GUIDED EXCISION IM NODE;  Surgeon: Emelia Loron, MD;  Location: [facility] OR;  Service: General;  Laterality: Left;  PEC BLOCK   CESAREAN SECTION WITH BILATERAL TUBAL LIGATION Bilateral 07/23/2015   Procedure: CESAREAN SECTION WITH BILATERAL TUBAL LIGATION;  Surgeon: Reva Bores, MD;  Location: [facility];  Service: Obstetrics;  Laterality: Bilateral;   PORTACATH PLACEMENT Right 02/11/2020   Procedure: INSERTION PORT-A-CATH WITH ULTRASOUND GUIDANCE;  Surgeon: Emelia Loron, MD;  Location: [facility];  Service: General;  Laterality: Right;   TUBAL LIGATION      No family history on file.  Social History   Socioeconomic History   Marital status: Single    Spouse name: Not on file   Number of children: 3   Years of education: Not on file   Highest education level: 9th grade  Occupational History   Not on file  Tobacco Use   Smoking status: Never Smoker   Smokeless tobacco: Never Used  Vaping Use   Vaping Use: Never used  Substance and Sexual Activity   Alcohol use: No   Drug use: No   Sexual activity: Yes    Birth control/protection: Surgical  Other Topics Concern   Not on file  Social History Narrative   Not on file   Social Determinants of Health   Financial Resource Strain: Not on file  Food Insecurity: Not on file  Transportation Needs: No Transportation Needs   Lack of  Transportation (Medical): No   Lack of Transportation (Non-Medical): No  Physical Activity: Not on file  Stress: Not on file  Social Connections: Not on file  Intimate Partner Violence: Not on file    Past Medical History, Surgical history, Social history, and Family history were reviewed and updated as appropriate.   Please see review of systems for further details on the patient's review from today.   Review of Systems:  Review of Systems  Constitutional: Negative for chills, diaphoresis and fever.  HENT: Negative for facial swelling and trouble swallowing.   Respiratory: Negative for cough, chest tightness and shortness of breath.   Cardiovascular: Negative for chest pain.  Skin: Positive for color change. Negative for rash.    Objective:   Physical Exam:  LMP 01/20/2020 (Exact Date)  ECOG: 0  Physical Exam Constitutional:      General: She is not in acute distress.    Appearance: She is not ill-appearing, toxic-appearing or diaphoretic.  HENT:     Head: Normocephalic and atraumatic.  Skin:    Findings: Erythema present.     Comments: There is erythema and increased warmth along the left lateral chest wall. No exudate is noted. There is a area of raised tissue consistent with hives.  Neurological:     Coordination: Coordination normal.     Gait: Gait normal.  Psychiatric:  Mood and Affect: Mood normal.        Behavior: Behavior normal.        Thought Content: Thought content normal.        Judgment: Judgment normal.     Lab Review:     Component Value Date/Time   NA 141 08/05/2020 1244   NA 138 08/15/2017 1525   K 3.8 08/05/2020 1244   CL 105 08/05/2020 1244   CO2 27 08/05/2020 1244   GLUCOSE 115 (H) 08/05/2020 1244   BUN 18 08/05/2020 1244   BUN 12 08/15/2017 1525   CREATININE 0.76 08/05/2020 1244   CREATININE 0.77 02/26/2020 0915   CALCIUM 9.4 08/05/2020 1244   PROT 7.2 08/05/2020 1244   PROT 7.4 01/03/2017 1642   ALBUMIN 3.7 08/05/2020 1244    ALBUMIN 4.3 01/03/2017 1642   AST 23 08/05/2020 1244   AST 17 02/26/2020 0915   ALT 26 08/05/2020 1244   ALT 21 02/26/2020 0915   ALKPHOS 104 08/05/2020 1244   BILITOT 0.3 08/05/2020 1244   BILITOT <0.2 (L) 02/26/2020 0915   GFRNONAA >60 08/05/2020 1244   GFRNONAA >60 02/26/2020 0915   GFRAA >60 06/03/2020 0949   GFRAA >60 02/26/2020 0915       Component Value Date/Time   WBC 5.0 08/05/2020 1244   RBC 4.03 08/05/2020 1244   HGB 11.7 (L) 08/05/2020 1244   HGB 12.0 02/26/2020 0915   HCT 36.6 08/05/2020 1244   PLT 252 08/05/2020 1244   PLT 158 02/26/2020 0915   MCV 90.8 08/05/2020 1244   MCV 80.9 01/03/2017 1651   MCH 29.0 08/05/2020 1244   MCHC 32.0 08/05/2020 1244   RDW 13.9 08/05/2020 1244   LYMPHSABS 1.7 08/05/2020 1244   MONOABS 0.5 08/05/2020 1244   EOSABS 0.1 08/05/2020 1244   BASOSABS 0.0 08/05/2020 1244   -------------------------------  Imaging from last 24 hours (if applicable):  Radiology interpretation: No results found.

## 2020-09-08 ENCOUNTER — Other Ambulatory Visit: Payer: Self-pay | Admitting: Oncology

## 2020-09-08 ENCOUNTER — Ambulatory Visit: Payer: No Typology Code available for payment source

## 2020-09-09 ENCOUNTER — Ambulatory Visit
Admission: RE | Admit: 2020-09-09 | Discharge: 2020-09-09 | Disposition: A | Discharge status: Home / Self Care | Payer: No Typology Code available for payment source | Source: Ambulatory Visit | Attending: Radiation Oncology | Admitting: Radiation Oncology

## 2020-09-10 ENCOUNTER — Ambulatory Visit
Admission: RE | Admit: 2020-09-10 | Discharge: 2020-09-10 | Disposition: A | Discharge status: Home / Self Care | Payer: No Typology Code available for payment source | Source: Ambulatory Visit | Attending: Radiation Oncology | Admitting: Radiation Oncology

## 2020-09-14 ENCOUNTER — Ambulatory Visit
Admission: RE | Admit: 2020-09-14 | Discharge: 2020-09-14 | Disposition: A | Discharge status: Home / Self Care | Payer: No Typology Code available for payment source | Source: Ambulatory Visit | Attending: Radiation Oncology | Admitting: Radiation Oncology

## 2020-09-14 ENCOUNTER — Telehealth: Payer: Self-pay | Admitting: *Deleted

## 2020-09-14 DIAGNOSIS — Z510 Encounter for antineoplastic radiation therapy: Secondary | ICD-10-CM | POA: Insufficient documentation

## 2020-09-14 DIAGNOSIS — Z170 Estrogen receptor positive status [ER+]: Secondary | ICD-10-CM | POA: Insufficient documentation

## 2020-09-14 DIAGNOSIS — C50412 Malignant neoplasm of upper-outer quadrant of left female breast: Secondary | ICD-10-CM | POA: Insufficient documentation

## 2020-09-14 NOTE — Telephone Encounter (Signed)
Per communication with Rad Onc nurse - pt states cellulitis under right arm is improved since use of abx started 09/03/2020.  Pt stopped the capecitibine per her understanding ( note provider who saw her on 12/23 did not advise her to stop ) - when she started the abx and has not restarted.  She is currently had 11 rad treatments of 25.  Inquiry is should pt resume capecitibine.  Plan at present is for MD to review tomorrow and advise regarding resuming capecitibine for completion of radiation.

## 2020-09-15 ENCOUNTER — Ambulatory Visit
Admission: RE | Admit: 2020-09-15 | Discharge: 2020-09-15 | Disposition: A | Discharge status: Home / Self Care | Payer: No Typology Code available for payment source | Source: Ambulatory Visit | Attending: Radiation Oncology | Admitting: Radiation Oncology

## 2020-09-15 NOTE — Progress Notes (Signed)
[place]  [phone] [phone]     ID: [name] DOB: 13-Feb-1976  MR#: [id number]  ZYY#:[id number]  Patient Care Team: Jacelyn Pi, Lilia Argue, MD as PCP - General (Family Medicine) Rolm Bookbinder, MD as Consulting Physician (General Surgery) Elham Fini, Virgie Dad, MD as Consulting Physician (Oncology) Eppie Gibson, MD as Attending Physician (Radiation Oncology) Rockwell Germany, RN as Oncology Nurse Navigator Mauro Kaufmann, RN as Oncology Nurse Navigator Chauncey Cruel, MD OTHER MD:  CHIEF COMPLAINT: Triple negative breast cancer  CURRENT TREATMENT: Adjuvant radiation   INTERVAL HISTORY:  [name] returns today for follow up of her triple negative breast cancer.  She is accompanied by a [place] interpreter.  Since her last visit, she received her Covid vaccine booster Therapist, music) on 08/19/2020.  She was referred back to Dr. Isidore Moos on 08/18/2020 to discuss radiation therapy. She subsequently began treatment on 08/26/2020 and is scheduled to finish on 10/09/2020.  [name] by on 08/28/2020 to inform us of a painful area in her left chest wall. Photo of the area is in my note from that day. I started her on keflex at that time. She returned per my request on 09/03/2020 and was seen in our symptom management clinic. She was switched to Bactrim by Sandi Mealy, PA-C, and also prescribed a prednisone taper for some allergic reaction in the area.  She was seen by a nurse following her radiation treatment yesterday, 09/15/2020 (see Egbert Garibaldi, RN's notes from that day).   She was started on capecitabine at her last visit on 08/05/2020. This was Thomas, however, on 08/28/2020 when she informed me of having strange headaches.  Her port was not removed in light of possible pembrolizumab infusions to follow.  Her case was also reviewed at the 07/29/2020 breast cancer multidisciplinary conference and pembrolizumab plus or minus fluorouracil  was recommended.   REVIEW OF SYSTEMS: [name] tells me sometimes she has palpitations and they wake her up at night.  She does say that she feels anxious and that "this is not the way I normally am".  The area of concern in the left lateral chest wall, be on the lateral axillary line, is not itchy or painful.  It does not cause her any distress.  She denies any fevers, cough, phlegm production, or pleurisy.  A detailed review of systems today was otherwise stable   COVID 19 VACCINATION STATUS: She had the J&J vaccine x1., infection 06/20/2020, and Pfizer booster on 08/19/2020   HISTORY OF CURRENT ILLNESS: From the original intake note:  [name] herself palpated a left beast lump with associated pain. She underwent bilateral diagnostic mammography with tomography and left breast ultrasonography at [facility] on 01/23/2020 showing: breast density category B; palpable 3.6 cm left breast mass with associated calcifications at 1 o'clock; 0.8 cm left breast mass at 2 o'clock, concerning for abnormal intramammary lymph node; at least two abnormal left axillary lymph nodes.  Accordingly on 01/23/2020 she proceeded to biopsy of the left breast area in question. The pathology from this procedure ([id number]) showed: invasive ductal carcinoma at 1 o'clock, grade 3. Prognostic indicators significant for: estrogen receptor, 20% positive with moderate staining intensity and progesterone receptor, 0% negative. Proliferation marker Ki67 at 40%. HER2 negative by immunohistochemistry (1+).  The left axillary lymph node confirmed metastatic carcinoma.  The left intramammary lymph node was negative for carcinoma.  The patient's subsequent history is as detailed below.   PAST MEDICAL HISTORY: Past Medical  History:  Diagnosis Date  . Allergy   . Asthma   . Breast cancer in female [facility])    Left  . Chronic back pain   . History of breast cancer   . Hx of migraines   . Morbid obesity ([place])      PAST SURGICAL HISTORY: Past Surgical History:  Procedure Laterality Date  . BREAST LUMPECTOMY WITH RADIOACTIVE SEED AND SENTINEL LYMPH NODE BIOPSY Left 07/15/2020   Procedure: LEFT BREAST LUMPECTOMY WITH RADIOACTIVE SEED AND LEFT AXILLARY SENTINEL LYMPH NODE BIOPSY, LEFT AXILLARY NODE RADIOACTIVE SEED GUIDED EXCISION, LEFT BREAST RADIOACTIVE SEED GUIDED EXCISION IM NODE;  Surgeon: Rolm Bookbinder, MD;  Location: [facility];  Service: General;  Laterality: Left;  PEC BLOCK  . CESAREAN SECTION WITH BILATERAL TUBAL LIGATION Bilateral 07/23/2015   Procedure: CESAREAN SECTION WITH BILATERAL TUBAL LIGATION;  Surgeon: Donnamae Jude, MD;  Location: [facility];  Service: Obstetrics;  Laterality: Bilateral;  . PORTACATH PLACEMENT Right 02/11/2020   Procedure: INSERTION PORT-A-CATH WITH ULTRASOUND GUIDANCE;  Surgeon: Rolm Bookbinder, MD;  Location: [facility];  Service: General;  Laterality: Right;  . TUBAL LIGATION      FAMILY HISTORY: No family history on file.  Her parents are both living as of 01/2020, her father age 11 and her mother age 9. She has three maternal half-siblings (1 sister, 2 brothers) and four paternal half-siblings (1 sister, 3 brothers). There is no family history of cancer to her knowledge.   GYNECOLOGIC HISTORY:  Patient's last menstrual period was 01/20/2020 (exact date). Menarche: 45 years old Age at first live birth: 45 years old [place] P 3 LMP 01/2020 Contraceptive s/p tubal ligation HRT n/a  Hysterectomy? no BSO? 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 Husband Vaughan Sine works for a Financial trader, both in [employer]. They are both from [place]. She lives at home with [place] and their three children-- [name], [place], and [name]. She attends an [employer].    ADVANCED DIRECTIVES: In the absence of any documentation to the contrary, the patient's  spouse is their HCPOA.    HEALTH MAINTENANCE: Social History   Tobacco Use  . Smoking status: Never Smoker  . Smokeless tobacco: Never Used  Vaping Use  . Vaping Use: Never used  Substance Use Topics  . Alcohol use: No  . Drug use: No     Colonoscopy: n/a (age)  PAP: 11/2017, negative (per patient)  Bone density: n/a (age)   Allergies  Allergen Reactions  . Doxorubicin Hcl Rash    Experience chest tightness, abdominal pain, and redness around lips during and shortly after doxorubicin bolus.     Current Outpatient Medications  Medication Sig Dispense Refill  . acetaminophen (TYLENOL) 500 MG tablet Take 500-1,000 mg by mouth every 6 (six) hours as needed for moderate pain or headache.    . albuterol (VENTOLIN HFA) 108 (90 Base) MCG/ACT inhaler Inhale 1-2 puffs into the lungs every 6 (six) hours as needed for wheezing or shortness of breath.    . capecitabine (XELODA) 500 MG tablet Take 2 tablets (1,000 mg total) by mouth 2 (two) times daily after a meal. Take only or radiation treatment days. 84 tablet 0  . cephALEXin (KEFLEX) 500 MG capsule Take 1 capsule (500 mg total) by mouth 2 (two) times daily. 14 capsule 0  . ibuprofen (ADVIL) 200 MG tablet Take 200 mg by mouth every 6 (six) hours  as needed for headache or moderate pain.    Marland Kitchen oxyCODONE (OXY IR/ROXICODONE) 5 MG immediate release tablet Take 1 tablet (5 mg total) by mouth every 6 (six) hours as needed for moderate pain, severe pain or breakthrough pain. 12 tablet 0  . predniSONE (DELTASONE) 5 MG tablet 6 tab x 1 day, 5 tab x 1 day, 4 tab x 1 day, 3 tab x 1 day, 2 tab x 1 day, 1 tab x 1 day, stop 21 tablet 0  . sulfamethoxazole-trimethoprim (BACTRIM DS) 800-160 MG tablet Take 1 tablet by mouth 2 (two) times daily. 14 tablet 0   No current facility-administered medications for this visit.    OBJECTIVE: Spanish speaker who appears stated age  40:   09/16/20 1146  BP: 113/60  Pulse: 76  Resp: 18  Temp: (!) 97.5 F  (36.4 C)  SpO2: 98%     Body mass index is 37.73 kg/m.   Wt Readings from Last 3 Encounters:  09/16/20 206 lb 4.8 oz (93.6 kg)  08/05/20 203 lb (92.1 kg)  07/15/20 201 lb (91.2 kg)      ECOG FS:1 - Symptomatic but completely ambulatory  Sclerae unicteric, EOMs intact Wearing a mask No cervical or supraclavicular adenopathy Lungs no rales or rhonchi Heart regular rate and rhythm Abd soft, obese, nontender, positive bowel sounds MSK no focal spinal tenderness, no upper extremity lymphedema Neuro: nonfocal, well oriented, appropriate affect Breasts: The right breast is unremarkable.  The left breast is status post lumpectomy and is currently receiving radiation.  The incisions are healing very nicely.  There is minimal erythema over the breast and only the beginning of some skin changes.  There is no evidence of residual recurrent disease. Skin: The area of concern is reimaged below.  There is no palpable component, no tenderness to palpation, and in general it looks improved as compared to prior.  Left breast and chest wall 09/17/2019    LAB RESULTS:  CMP     Component Value Date/Time   NA 141 08/05/2020 1244   NA 138 08/15/2017 1525   K 3.8 08/05/2020 1244   CL 105 08/05/2020 1244   CO2 27 08/05/2020 1244   GLUCOSE 115 (H) 08/05/2020 1244   BUN 18 08/05/2020 1244   BUN 12 08/15/2017 1525   CREATININE 0.76 08/05/2020 1244   CREATININE 0.77 02/26/2020 0915   CALCIUM 9.4 08/05/2020 1244   PROT 7.2 08/05/2020 1244   PROT 7.4 01/03/2017 1642   ALBUMIN 3.7 08/05/2020 1244   ALBUMIN 4.3 01/03/2017 1642   AST 23 08/05/2020 1244   AST 17 02/26/2020 0915   ALT 26 08/05/2020 1244   ALT 21 02/26/2020 0915   ALKPHOS 104 08/05/2020 1244   BILITOT 0.3 08/05/2020 1244   BILITOT <0.2 (L) 02/26/2020 0915   GFRNONAA >60 08/05/2020 1244   GFRNONAA >60 02/26/2020 0915   GFRAA >60 06/03/2020 0949   GFRAA >60 02/26/2020 0915    No results found for: TOTALPROTELP, ALBUMINELP,  A1GS, A2GS, BETS, BETA2SER, GAMS, MSPIKE, SPEI  Lab Results  Component Value Date   WBC 5.0 08/05/2020   NEUTROABS 2.6 08/05/2020   HGB 11.7 (L) 08/05/2020   HCT 36.6 08/05/2020   MCV 90.8 08/05/2020   PLT 252 08/05/2020    No results found for: LABCA2  No components found for: [id number]  No results for input(s): INR in the last 168 hours.  No results found for: LABCA2  No results found for: OHY073  No results found for:  EXH371  No results found for: IRC789  No results found for: CA2729  No components found for: HGQUANT  No results found for: CEA1 / No results found for: CEA1   No results found for: AFPTUMOR  No results found for: CHROMOGRNA  No results found for: KPAFRELGTCHN, LAMBDASER, KAPLAMBRATIO (kappa/lambda light chains)  No results found for: HGBA, HGBA2QUANT, HGBFQUANT, HGBSQUAN (Hemoglobinopathy evaluation)   No results found for: LDH  No results found for: IRON, TIBC, IRONPCTSAT (Iron and TIBC)  No results found for: FERRITIN  Urinalysis No results found for: COLORURINE, APPEARANCEUR, LABSPEC, PHURINE, GLUCOSEU, HGBUR, BILIRUBINUR, KETONESUR, PROTEINUR, UROBILINOGEN, NITRITE, LEUKOCYTESUR   STUDIES: No results found.   ELIGIBLE FOR AVAILABLE RESEARCH PROTOCOL: [phone]?  ASSESSMENT: 45 y.o. [place] woman status post left breast upper outer quadrant biopsy 01/23/2020 for a clinical T3 N1, 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  (a) chest CT scan and bone scan 02/07/2020 showed no evidence of metastatic disease  (1) neoadjuvant chemotherapy will consist of doxorubicin and cyclophosphamide in dose dense fashion x4 starting 02/12/2020 to be followed by paclitaxel and carboplatin weekly x12  (a) echo 02/07/2020 shows an ejection fraction in the 55-60% range  (b) Epirubicin substituted for Doxorubicin secondary to allergic rash from Doxorubicin  (2) status post left lumpectomy and  sentinel lymph node sampling 07/15/2020 for a ypT1b ypN0 residual invasive ductal carcinoma, grade 3, with negative margins.  (a) a total of 4 left axillary lymph nodes were removed  (b) repeat prognostic panel on the final pathology again triple negative, with an MIB-1 of 50%.  (3) adjuvant radiation  (4) genetics testing 02/07/2020 through the [facility] Common Hereditary Cancers Panel found no deleterious mutations in  APC, ATM, AXIN2, BARD1, BMPR1A, BRCA1, BRCA2, BRIP1, CDH1, CDKN2A (p14ARF), CDKN2A (p16INK4a), CKD4, CHEK2, CTNNA1, DICER1, EPCAM (Deletion/duplication testing only), GREM1 (promoter region deletion/duplication testing only), KIT, MEN1, MLH1, MSH2, MSH3, MSH6, MUTYH, NBN, NF1, NHTL1, PALB2, PDGFRA, PMS2, POLD1, POLE, PTEN, RAD50, RAD51C, RAD51D, RNF43, SDHB, SDHC, SDHD, SMAD4, SMARCA4. STK11, TP53, TSC1, TSC2, and VHL.  The following genes were evaluated for sequence changes only: SDHA and HOXB13 c.251G>A variant only.  (a) VUS in MSH6 called c.2744C>G identified.   (5) adjuvant capecitabine at sensitizing doses to be given during radiation  (6) adjuvant pertuzumab to start after radiation is completed   PLAN: [name] is tolerating her radiation well.  She will complete treatments at the end of this month.  I am still not sure what caused the nonpalpable confluent erythema in the lateral left chest wall.  It is however improved.  I am writing for hydrocortisone ointment for her to use over that area only to see if we can get it more definitively cleared.  I am holding the capecitabine given the fact that I do not know what caused this localized rash and also because she is complaining of palpitations  I rechecked her pulse during the visit today.  It was always around 80 and regular.  I think she may have anxiety but in any case I taught her how to check her own pulse and to call us if she notices that it is faster than 100 beats a minute or very irregular.  I do think that she is  understandably anxious.  I think she will benefit from venlafaxine and I wrote a prescription for her to start that today.  When she sees me later this month we will likely increase the dose  She  knows to call for any other reason that may develop before the next visit  Total encounter time 25 minutes.Sarajane Jews C. Charleston Hankin, MD 09/16/20 12:15 PM Medical Oncology and Hematology [facility] [place], [place] [address] Tel. [phone]    Fax. [phone]   I, Wilburn Mylar, am acting as scribe for Dr. Virgie Dad. Analayah Brooke.  I, Lurline Del MD, have reviewed the above documentation for accuracy and completeness, and I agree with the above.   *Total Encounter Time as defined by the Centers for Medicare and Medicaid Services includes, in addition to the face-to-face time of a patient visit (documented in the note above) non-face-to-face time: obtaining and reviewing outside history, ordering and reviewing medications, tests or procedures, care coordination (communications with other health care professionals or caregivers) and documentation in the medical record.

## 2020-09-15 NOTE — Progress Notes (Signed)
After assessing patient's left axilla, I was notified by therapist on L2 that patient was complaining of heart palpitations, as well as pain down her left arm that ended in her palm. Patient told the therapist that the heart palpitations kept her up last night while trying to sleep, and she feels like she's been experiencing them since she got her COVID booster on 08/19/2020. Went over to dressing room again to assess patient. She did not appear to be in distress. Vital signs stable. When asked if she had any headaches she mentioned some discomfort along the left side of her head and intermittent nausea any time she eats. Patient denied any shortness of breath or symptoms of GERD. Obtained an EKG which showed NSR (see image under ECG tab in chart review). Instructed patient to go to ED should symptoms worsen, otherwise I would pass information along to Dr. Darnelle Catalan to make him aware during his visit with patient tomorrow. Patient verbalized understanding and agreement of plan. Ambulated out of clinic unassisted and without incident.    09/15/20 1125  Vitals  BP 119/78  BP Location Right Arm  Patient Position Sitting  Pulse Rate 97  Resp 17  Pulse Oximetry  SpO2 100 %

## 2020-09-15 NOTE — Progress Notes (Signed)
Saw patient in dressing room before XRT today to assess left axilla. Area appears swollen with mild erythema. Patient states area is still very tender to the touch, and she doesn't feel like it has improved since completing antibiotic. Updated Val Dodd-RN with Dr. Darnelle Catalan who advised that patient should continue to hold capecitabine prescription, and that she would make an appointment for Dr. Darnelle Catalan to assess area tomorrow after patient's XRT treatment. Patient updated on plan. She verbalized understanding/agreement.   Spanish Interpreter [name] contacted and updated on plan. She will be on site tomorrow to help interpret for patient during visit with Dr. Darnelle Catalan.

## 2020-09-16 ENCOUNTER — Other Ambulatory Visit: Payer: Self-pay | Admitting: Oncology

## 2020-09-16 ENCOUNTER — Ambulatory Visit
Admission: RE | Admit: 2020-09-16 | Discharge: 2020-09-16 | Disposition: A | Discharge status: Home / Self Care | Payer: No Typology Code available for payment source | Source: Ambulatory Visit | Attending: Radiation Oncology | Admitting: Radiation Oncology

## 2020-09-16 ENCOUNTER — Inpatient Hospital Stay: Payer: Self-pay | Attending: Oncology | Admitting: Oncology

## 2020-09-16 ENCOUNTER — Other Ambulatory Visit: Payer: Self-pay

## 2020-09-16 VITALS — BP 113/60 | HR 76 | Temp 97.5°F | Resp 18 | Ht 62.0 in | Wt 206.3 lb

## 2020-09-16 DIAGNOSIS — Z171 Estrogen receptor negative status [ER-]: Secondary | ICD-10-CM | POA: Insufficient documentation

## 2020-09-16 DIAGNOSIS — Z170 Estrogen receptor positive status [ER+]: Secondary | ICD-10-CM

## 2020-09-16 DIAGNOSIS — Z79899 Other long term (current) drug therapy: Secondary | ICD-10-CM | POA: Insufficient documentation

## 2020-09-16 DIAGNOSIS — Z923 Personal history of irradiation: Secondary | ICD-10-CM | POA: Insufficient documentation

## 2020-09-16 DIAGNOSIS — J45909 Unspecified asthma, uncomplicated: Secondary | ICD-10-CM | POA: Insufficient documentation

## 2020-09-16 DIAGNOSIS — C773 Secondary and unspecified malignant neoplasm of axilla and upper limb lymph nodes: Secondary | ICD-10-CM | POA: Insufficient documentation

## 2020-09-16 DIAGNOSIS — C50412 Malignant neoplasm of upper-outer quadrant of left female breast: Secondary | ICD-10-CM | POA: Insufficient documentation

## 2020-09-16 MED ORDER — HYDROCORTISONE 1 % EX OINT: 1.0000 "application " | TOPICAL_OINTMENT | Freq: Two times a day (BID) | CUTANEOUS | 0 refills | Status: DC

## 2020-09-16 MED ORDER — VENLAFAXINE HCL ER 37.5 MG PO CP24: 37.5000 mg | ORAL_CAPSULE | Freq: Every day | ORAL | 4 refills | Status: DC

## 2020-09-17 ENCOUNTER — Ambulatory Visit
Admission: RE | Admit: 2020-09-17 | Discharge: 2020-09-17 | Disposition: A | Discharge status: Home / Self Care | Payer: No Typology Code available for payment source | Source: Ambulatory Visit | Attending: Radiation Oncology | Admitting: Radiation Oncology

## 2020-09-18 ENCOUNTER — Other Ambulatory Visit: Payer: Self-pay

## 2020-09-18 ENCOUNTER — Ambulatory Visit
Admission: RE | Admit: 2020-09-18 | Discharge: 2020-09-18 | Disposition: A | Discharge status: Home / Self Care | Payer: No Typology Code available for payment source | Source: Ambulatory Visit | Attending: Radiation Oncology | Admitting: Radiation Oncology

## 2020-09-21 ENCOUNTER — Ambulatory Visit
Admission: RE | Admit: 2020-09-21 | Discharge: 2020-09-21 | Disposition: A | Discharge status: Home / Self Care | Payer: No Typology Code available for payment source | Source: Ambulatory Visit | Attending: Radiation Oncology | Admitting: Radiation Oncology

## 2020-09-21 ENCOUNTER — Telehealth: Payer: Self-pay | Admitting: Oncology

## 2020-09-21 ENCOUNTER — Encounter (HOSPITAL_COMMUNITY): Payer: Self-pay

## 2020-09-21 ENCOUNTER — Other Ambulatory Visit: Payer: Self-pay

## 2020-09-21 DIAGNOSIS — R079 Chest pain, unspecified: Secondary | ICD-10-CM | POA: Insufficient documentation

## 2020-09-21 DIAGNOSIS — Z853 Personal history of malignant neoplasm of breast: Secondary | ICD-10-CM | POA: Insufficient documentation

## 2020-09-21 DIAGNOSIS — Z5321 Procedure and treatment not carried out due to patient leaving prior to being seen by health care provider: Secondary | ICD-10-CM | POA: Insufficient documentation

## 2020-09-21 LAB — CBC
HCT: 39 % (ref 36.0–46.0)
Hemoglobin: 12.5 g/dL (ref 12.0–15.0)
MCH: 28 pg (ref 26.0–34.0)
MCHC: 32.1 g/dL (ref 30.0–36.0)
MCV: 87.4 fL (ref 80.0–100.0)
Platelets: 222 10*3/uL (ref 150–400)
RBC: 4.46 MIL/uL (ref 3.87–5.11)
RDW: 14 % (ref 11.5–15.5)
WBC: 3.8 10*3/uL — ABNORMAL LOW (ref 4.0–10.5)
nRBC: 0 % (ref 0.0–0.2)

## 2020-09-21 LAB — BASIC METABOLIC PANEL
Anion gap: 9 (ref 5–15)
BUN: 12 mg/dL (ref 6–20)
CO2: 26 mmol/L (ref 22–32)
Calcium: 9.6 mg/dL (ref 8.9–10.3)
Chloride: 102 mmol/L (ref 98–111)
Creatinine, Ser: 0.67 mg/dL (ref 0.44–1.00)
GFR, Estimated: >60 mL/min (ref >60)
Glucose, Bld: 101 mg/dL — ABNORMAL HIGH (ref 70–99)
Potassium: 3.7 mmol/L (ref 3.5–5.1)
Sodium: 137 mmol/L (ref 135–145)

## 2020-09-21 LAB — TROPONIN I (HIGH SENSITIVITY): Troponin I (High Sensitivity): 9 ng/L (ref <18)

## 2020-09-21 LAB — I-STAT BETA HCG BLOOD, ED (MC, WL, AP ONLY): I-stat hCG, quantitative: <5 m[IU]/mL (ref <5)

## 2020-09-21 NOTE — ED Triage Notes (Signed)
Pt presents with c/o chest pain that radiates to her back and left arm for approx one week. Pt is a cancer pt, last chemo treatment was in September of 2021. Pt is currently undergoing radiation, breast cancer.

## 2020-09-21 NOTE — Telephone Encounter (Signed)
No 1/5 los. No changes made to pt's schedule.  

## 2020-09-22 ENCOUNTER — Other Ambulatory Visit: Payer: Self-pay

## 2020-09-22 ENCOUNTER — Telehealth: Payer: Self-pay | Admitting: *Deleted

## 2020-09-22 ENCOUNTER — Other Ambulatory Visit: Payer: Self-pay | Admitting: Oncology

## 2020-09-22 ENCOUNTER — Emergency Department (HOSPITAL_COMMUNITY)
Admission: EM | Admit: 2020-09-22 | Discharge: 2020-09-22 | Disposition: A | Discharge status: Home / Self Care | Payer: No Typology Code available for payment source | Attending: Emergency Medicine | Admitting: Emergency Medicine

## 2020-09-22 ENCOUNTER — Ambulatory Visit
Admission: RE | Admit: 2020-09-22 | Discharge: 2020-09-22 | Disposition: A | Discharge status: Home / Self Care | Payer: No Typology Code available for payment source | Source: Ambulatory Visit | Attending: Radiation Oncology | Admitting: Radiation Oncology

## 2020-09-22 DIAGNOSIS — C50412 Malignant neoplasm of upper-outer quadrant of left female breast: Secondary | ICD-10-CM

## 2020-09-22 DIAGNOSIS — R0789 Other chest pain: Secondary | ICD-10-CM

## 2020-09-22 DIAGNOSIS — Z170 Estrogen receptor positive status [ER+]: Secondary | ICD-10-CM

## 2020-09-22 DIAGNOSIS — R0602 Shortness of breath: Secondary | ICD-10-CM

## 2020-09-22 NOTE — Telephone Encounter (Signed)
Called [name] the interp. to ask that she call this patient and inform of appt. On 09-25-20 - arrival time- 3:20 pm @ Dr[facility] Eleon[facility] Ch[facility] Office, address- [place] spoke with [name] and she agreed to do so.

## 2020-09-22 NOTE — ED Notes (Signed)
Called 3x for room placement. Eloped from waiting area.  

## 2020-09-23 ENCOUNTER — Other Ambulatory Visit: Payer: Self-pay | Admitting: Oncology

## 2020-09-23 ENCOUNTER — Ambulatory Visit
Admission: RE | Admit: 2020-09-23 | Discharge: 2020-09-23 | Disposition: A | Discharge status: Home / Self Care | Payer: No Typology Code available for payment source | Source: Ambulatory Visit | Attending: Radiation Oncology | Admitting: Radiation Oncology

## 2020-09-23 DIAGNOSIS — C50412 Malignant neoplasm of upper-outer quadrant of left female breast: Secondary | ICD-10-CM

## 2020-09-23 NOTE — Progress Notes (Signed)
I asked [name] to stop in today with her interpreter after radiation just to find out exactly what was going on.  There are several things going on.  Of course she is having some redness in the chest wall from the radiation port.  In the lateral left breast she still has a squarish looking erythematous area which is faint, skin with very well-defined borders suggesting that it is related to pressure, with no palpable component.  I think this is going to be related to her bra but we do not have the same thing on the other side.  She says her bra really does not pinch in that area she feels dizzy sometimes and she says her eyes also do not focus very well she has a little bit of a cough and she wonders if she should take Robitussin.  She has pain in the chest wall really is not really cardiac type pain and is not exercise or activity related.  It is associated with her scars and we discussed the fact that this is due to inflammation from the surgery and the radiation.  I reassured her that this is not cardiac.  She says sometimes she has palpitations.  At the last visit I showed her how to take her pulse.  During 1 of these episodes she took her pulse and she found it to be 86 and regular.  I told her it was not really a big concern unless it is greater than 120 or irregular.  She has a little bit of a headache.  I think she would benefit from Claritin.  She will also try a little bit of Robitussin for her cough.  I reassured her that I do not see any evidence of cancer and I do not hear anything that suggests a primary heart condition either.

## 2020-09-24 ENCOUNTER — Ambulatory Visit
Admission: RE | Admit: 2020-09-24 | Discharge: 2020-09-24 | Disposition: A | Discharge status: Home / Self Care | Payer: No Typology Code available for payment source | Source: Ambulatory Visit | Attending: Radiation Oncology | Admitting: Radiation Oncology

## 2020-09-24 NOTE — Progress Notes (Signed)
Cardiology Office Note:   Date:  09/25/2020  NAME:  [name]    MRN: [id number] DOB:  1976/01/06   PCP:  Jacelyn Pi, Lilia Argue, MD   Referring MD: Eppie Gibson, MD   Chief Complaint  Patient presents with  . Chest Pain   History of Present Illness:   [name] is a 45 y.o. female with a hx of breast CA s/p chemo/lumpectomy on adjuvant radiation who is being seen today for the evaluation of chest pain/SOB/palpitations at the request of Eppie Gibson, MD. She reports for the last 2 weeks she has had episodes of chest pain, shortness of breath and palpitations.  She describes dull sensation in her chest.  Can occur anytime.  Can last hours.  She reports the pain is really not gone away.  She was seen in the emergency room with negative cardiac enzymes.  Her EKG today is normal with no ischemic changes or evidence of prior infarction.  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  She only received doxorubicin.  She is not on adjuvant for extended chemoprophylaxis that would be detrimental to her heart.  She also reports she gets daily episodes of palpitations.  Symptoms have improved over the last 2 to 3 days.  Describes rapid heartbeat sensation that can also occur at any time.  She reports she is stressed as well.  She also is depressed.  She is going through a lot.  She also has been diagnosed with a rash.  This is on the left chest and left arm.  Her radiation oncologist had given her a cream for this.  She feels that the rash is different than the pain she is having.  The pain can also going to the back.  She also reports she is short of breath.  No evidence of heart failure on examination today.  She really has no CVD risk factors other than obesity.  BMI is 37.  Blood pressure 110/66.  She has no lower extremity edema.  She reports her grandmother likely died of heart disease.  She does not smoke, drink alcohol or use drugs.  She did have an echocardiogram prior to chemotherapy  which showed normal LV function.  She is never worn a monitor.  She has been seen by her oncologist who does believe her symptoms are stress related.  I suspect they are but we do need further evaluation.  No recent TSH in her system.  She is not anemic with a hemoglobin of 12.5.  Problem List 1. 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 Allergy   . Asthma   . Breast cancer in female [facility])    Left  . Chronic back pain   . History of breast cancer   . Hx of migraines   . Morbid obesity ([place])     Past Surgical History: Past Surgical History:  Procedure Laterality Date  . BREAST LUMPECTOMY WITH RADIOACTIVE SEED AND SENTINEL LYMPH NODE BIOPSY Left 07/15/2020   Procedure: LEFT BREAST LUMPECTOMY WITH RADIOACTIVE SEED AND LEFT AXILLARY SENTINEL LYMPH NODE BIOPSY, LEFT AXILLARY NODE RADIOACTIVE SEED GUIDED EXCISION, LEFT BREAST RADIOACTIVE SEED GUIDED EXCISION IM NODE;  Surgeon: Rolm Bookbinder, MD;  Location: [facility];  Service: General;  Laterality: Left;  PEC BLOCK  . CESAREAN SECTION WITH BILATERAL TUBAL LIGATION Bilateral 07/23/2015  Procedure: CESAREAN SECTION WITH BILATERAL TUBAL LIGATION;  Surgeon: Donnamae Jude, MD;  Location: [facility];  Service: Obstetrics;  Laterality: Bilateral;  . PORTACATH PLACEMENT Right 02/11/2020   Procedure: INSERTION PORT-A-CATH WITH ULTRASOUND GUIDANCE;  Surgeon: Rolm Bookbinder, MD;  Location: [facility];  Service: General;  Laterality: Right;  . TUBAL LIGATION      Current Medications: Current Meds  Medication Sig  . venlafaxine XR (EFFEXOR-XR) 37.5 MG 24 hr capsule Take 1 capsule (37.5 mg total) by mouth daily with breakfast.  . [DISCONTINUED] acetaminophen (TYLENOL) 500 MG tablet Take 500-1,000 mg by mouth every 6 (six) hours as needed for moderate pain or headache.  .  [DISCONTINUED] capecitabine (XELODA) 500 MG tablet Take 2 tablets (1,000 mg total) by mouth 2 (two) times daily after a meal. Take only or radiation treatment days.  . [DISCONTINUED] hydrocortisone 1 % ointment Apply 1 application topically 2 (two) times daily.  . [DISCONTINUED] ibuprofen (ADVIL) 200 MG tablet Take 200 mg by mouth every 6 (six) hours as needed for headache or moderate pain.     Allergies:    Doxorubicin hcl   Social History: Social History   Socioeconomic History  . Marital status: Legally Separated    Spouse name: Not on file  . Number of children: 3  . Years of education: Not on file  . Highest education level: 9th grade  Occupational History  . Occupation: Architect  Tobacco Use  . Smoking status: Never Smoker  . Smokeless tobacco: Never Used  Vaping Use  . Vaping Use: Never used  Substance and Sexual Activity  . Alcohol use: No  . Drug use: No  . Sexual activity: Yes    Birth control/protection: Surgical  Other Topics Concern  . Not on file  Social History Narrative  . Not on file   Social Determinants of Health   Financial Resource Strain: Not on file  Food Insecurity: Not on file  Transportation Needs: No Transportation Needs  . Lack of Transportation (Medical): No  . Lack of Transportation (Non-Medical): No  Physical Activity: Not on file  Stress: Not on file  Social Connections: Not on file     Family History: The patient's family history includes Heart disease in her maternal grandmother.  ROS:   All other ROS reviewed and negative. Pertinent positives noted in the HPI.     EKGs/Labs/Other Studies Reviewed:   The following studies were personally reviewed by me today:  EKG:  EKG is ordered today.  The ekg ordered today demonstrates normal sinus rhythm heart rate 75, incomplete left bundle branch block, and was personally reviewed by me.   TTE 02/07/2020 1. Left ventricular ejection fraction, by estimation, is 55 to 60%. The  left  ventricle has normal function. The left ventricle has no regional  wall motion abnormalities. Left ventricular diastolic parameters were  normal.  2. Right ventricular systolic function is normal. The right ventricular  size is normal.  3. The mitral valve is normal in structure. No evidence of mitral valve  regurgitation. No evidence of mitral stenosis.  4. The aortic valve is normal in structure. Aortic valve regurgitation is  not visualized. No aortic stenosis is present.   Recent Labs: 08/05/2020: ALT 26 09/21/2020: BUN 12; Creatinine, Ser 0.67; Hemoglobin 12.5; Platelets 222; Potassium 3.7; Sodium 137   Recent Lipid Panel No results found for: CHOL, TRIG, HDL, CHOLHDL, VLDL, LDLCALC, LDLDIRECT  Physical Exam:   VS:  BP 110/66   Pulse 75  Ht 5\' 2"  (1.575 m)   Wt 205 lb 12.8 oz (93.4 kg)   LMP 01/20/2020 (Exact Date)   SpO2 99%   BMI 37.64 kg/m    Wt Readings from Last 3 Encounters:  09/25/20 205 lb 12.8 oz (93.4 kg)  09/21/20 206 lb (93.4 kg)  09/16/20 206 lb 4.8 oz (93.6 kg)    General: Well nourished, well developed, in no acute distress Head: Atraumatic, normal size  Eyes: PEERLA, EOMI  Neck: Supple, no JVD Endocrine: No thryomegaly Cardiac: Normal S1, S2; RRR; no murmurs, rubs, or gallops Lungs: Clear to auscultation bilaterally, no wheezing, rhonchi or rales  Abd: Soft, nontender, no hepatomegaly  Ext: No edema, pulses 2+ Musculoskeletal: No deformities, BUE and BLE strength normal and equal Skin: Warm and dry, no rashes   Neuro: Alert and oriented to person, place, time, and situation, CNII-XII grossly intact, no focal deficits  Psych: Normal mood and affect   ASSESSMENT:   [name] is a 45 y.o. female who presents for the following: 1. Chest pain, unspecified type   2. SOB (shortness of breath) on exertion   3. Palpitations   4. Obesity (BMI 30-39.9)     PLAN:   1. Chest pain, unspecified type -Atypical chest pain described as dull and  achy and occurs for hours.  Associated with stress.  EKG in office demonstrates an incomplete right bundle branch block with no acute ischemic changes.  This is a normal finding in a young healthy person.  She had an echocardiogram earlier in 2021 prior to chemotherapy that demonstrated normal LV function.  She has completed chemotherapy.  She is not on chemoprophylaxis that would have any detrimental heart function.  She is undergone lumpectomy and she is undergoing adjuvant radiation therapy.  She does have a rash in the left chest and left arm.  Unclear how much the rash is playing into this.  She was seen in the emergency room with negative cardiac enzymes.  Overall she really has minimal CAD risk factors.  Her chemotherapy and radiation do not put her at risk for obstructive CAD.  Symptoms are not concerning for angina. -Symptoms are stress related.  I recommended stress reduction strategies.  I want to repeat an echocardiogram to make sure heart is normal.  She also endorses shortness of breath but has no evidence of heart failure examination.  An echo will be warranted.  We will also check a BNP.  She also describes palpitations and back pain.  Overall symptoms are highly consistent with stress.  2. SOB (shortness of breath) on exertion -Shortness of breath with activity.  No evidence of heart failure.  Normal EKG in office today.  BNP and echo as above.  3. Palpitations -Rapid heartbeat sensation.  Worse with stress.  We will exclude arrhythmia.  7-day Zio patch.  She also needs a TSH.  She is not anemic.  4. Obesity (BMI 30-39.9) -Diet and exercise recommended.  Disposition: Return if symptoms worsen or fail to improve.  Medication Adjustments/Labs and Tests Ordered: Current medicines are reviewed at length with the patient today.  Concerns regarding medicines are outlined above.  Orders Placed This Encounter  Procedures  . TSH  . Brain natriuretic peptide  . LONG TERM MONITOR (3-14  DAYS)  . EKG 12-Lead  . ECHOCARDIOGRAM COMPLETE   No orders of the defined types were placed in this encounter.   Patient Instructions  Medication Instructions:  The current medical regimen is effective;  continue  present plan and medications.  *If you need a refill on your cardiac medications before your next appointment, please call your pharmacy*   Lab Work: TSH, BNP  If you have labs (blood work) drawn today and your tests are completely normal, you will receive your results only by: Marland Kitchen MyChart Message (if you have MyChart) OR . A paper copy in the mail If you have any lab test that is abnormal or we need to change your treatment, we will call you to review the results.   Testing/Procedures: Echocardiogram - Your physician has requested that you have an echocardiogram. Echocardiography is a painless test that uses sound waves to create images of your heart. It provides your doctor with information about the size and shape of your heart and how well your heart's chambers and valves are working. This procedure takes approximately one hour. There are no restrictions for this procedure. This will be performed at our [facility] location - [address], [address].  ZIO XT- Long Term Monitor Instructions   Your physician has requested you wear your ZIO patch monitor____7___days.   This is a single patch monitor.  [employer] supplies one patch monitor per enrollment.  Additional stickers are not available.   Please do not apply patch if you will be having a Nuclear Stress Test, Echocardiogram, Cardiac CT, MRI, or Chest Xray during the time frame you would be wearing the monitor. The patch cannot be worn during these tests.  You cannot remove and re-apply the ZIO XT patch monitor.   Your ZIO patch monitor will be sent USPS Priority mail from [facility] directly to your home address. The monitor may also be mailed to a PO BOX if home delivery is not available.   It may take 3-5  days to receive your monitor after you have been enrolled.   Once you have received you monitor, please review enclosed instructions.  Your monitor has already been registered assigning a specific monitor serial # to you.   Applying the monitor   Shave hair from upper left chest.   Hold abrader disc by orange tab.  Rub abrader in 40 strokes over left upper chest as indicated in your monitor instructions.   Clean area with 4 enclosed alcohol pads .  Use all pads to assure are is cleaned thoroughly.  Let dry.   Apply patch as indicated in monitor instructions.  Patch will be place under collarbone on left side of chest with arrow pointing upward.   Rub patch adhesive wings for 2 minutes.Remove white label marked "1".  Remove white label marked "2".  Rub patch adhesive wings for 2 additional minutes.   While looking in a mirror, press and release button in center of patch.  A small green light will flash 3-4 times .  This will be your only indicator the monitor has been turned on.     Do not shower for the first 24 hours.  You may shower after the first 24 hours.   Press button if you feel a symptom. You will hear a small click.  Record Date, Time and Symptom in the Patient Log Book.   When you are ready to remove patch, follow instructions on last 2 pages of Patient Log Book.  Stick patch monitor onto last page of Patient Log Book.   Place Patient Log Book in [place] box.  Use locking tab on box and tape box closed securely.  The [facility] and [employer] has prepaid  postage on it.  Please place in mailbox as soon as possible.  Your physician should have your test results approximately 7 days after the monitor has been mailed back to [facility].   Call [place] at [phone] if you have questions regarding your ZIO XT patch monitor.  Call them immediately if you see an orange light blinking on your monitor.   If your monitor falls off in less than 4 days contact our  Monitor department at [phone].  If your monitor becomes loose or falls off after 4 days call [employer] at [phone] for suggestions on securing your monitor.     Follow-Up: At [facility], you and your health needs are our priority.  As part of our continuing mission to provide you with exceptional heart care, we have created designated Provider Care Teams.  These Care Teams include your primary Cardiologist (physician) and Advanced Practice Providers (APPs -  Physician Assistants and Nurse Practitioners) who all work together to provide you with the care you need, when you need it.  We recommend signing up for the patient portal called "MyChart".  Sign up information is provided on this After Visit Summary.  MyChart is used to connect with patients for Virtual Visits (Telemedicine).  Patients are able to view lab/test results, encounter notes, upcoming appointments, etc.  Non-urgent messages can be sent to your provider as well.   To learn more about what you can do with MyChart, go to [email or web address].    Your next appointment:   As needed  The format for your next appointment:   In Person  Provider:   Eleonore Chiquito, MD       Signed, Addison Naegeli. Audie Box, [place]  [address], [place], [place] [address] [phone]  09/25/2020 4:19 PM

## 2020-09-25 ENCOUNTER — Encounter: Payer: Self-pay | Admitting: Cardiovascular Disease

## 2020-09-25 ENCOUNTER — Other Ambulatory Visit: Payer: Self-pay

## 2020-09-25 ENCOUNTER — Encounter: Payer: Self-pay | Admitting: Radiology

## 2020-09-25 ENCOUNTER — Ambulatory Visit: Payer: No Typology Code available for payment source

## 2020-09-25 ENCOUNTER — Ambulatory Visit (INDEPENDENT_AMBULATORY_CARE_PROVIDER_SITE_OTHER): Payer: Self-pay | Admitting: Cardiovascular Disease

## 2020-09-25 ENCOUNTER — Ambulatory Visit
Admission: RE | Admit: 2020-09-25 | Discharge: 2020-09-25 | Disposition: A | Discharge status: Home / Self Care | Payer: No Typology Code available for payment source | Source: Ambulatory Visit | Attending: Radiation Oncology | Admitting: Radiation Oncology

## 2020-09-25 VITALS — BP 110/66 | HR 75 | Ht 62.0 in | Wt 205.8 lb

## 2020-09-25 DIAGNOSIS — R002 Palpitations: Secondary | ICD-10-CM

## 2020-09-25 DIAGNOSIS — R079 Chest pain, unspecified: Secondary | ICD-10-CM

## 2020-09-25 DIAGNOSIS — R0602 Shortness of breath: Secondary | ICD-10-CM

## 2020-09-25 DIAGNOSIS — E669 Obesity, unspecified: Secondary | ICD-10-CM

## 2020-09-25 NOTE — Patient Instructions (Signed)
Medication Instructions:  The current medical regimen is effective;  continue present plan and medications.  *If you need a refill on your cardiac medications before your next appointment, please call your pharmacy*   Lab Work: TSH, BNP  If you have labs (blood work) drawn today and your tests are completely normal, you will receive your results only by: Marland Kitchen MyChart Message (if you have MyChart) OR . A paper copy in the mail If you have any lab test that is abnormal or we need to change your treatment, we will call you to review the results.   Testing/Procedures: Echocardiogram - Your physician has requested that you have an echocardiogram. Echocardiography is a painless test that uses sound waves to create images of your heart. It provides your doctor with information about the size and shape of your heart and how well your heart's chambers and valves are working. This procedure takes approximately one hour. There are no restrictions for this procedure. This will be performed at our [facility] location - [address], [address].  ZIO XT- Long Term Monitor Instructions   Your physician has requested you wear your ZIO patch monitor____7___days.   This is a single patch monitor.  [employer] supplies one patch monitor per enrollment.  Additional stickers are not available.   Please do not apply patch if you will be having a Nuclear Stress Test, Echocardiogram, Cardiac CT, MRI, or Chest Xray during the time frame you would be wearing the monitor. The patch cannot be worn during these tests.  You cannot remove and re-apply the ZIO XT patch monitor.   Your ZIO patch monitor will be sent USPS Priority mail from [facility] directly to your home address. The monitor may also be mailed to a PO BOX if home delivery is not available.   It may take 3-5 days to receive your monitor after you have been enrolled.   Once you have received you monitor, please review enclosed instructions.  Your  monitor has already been registered assigning a specific monitor serial # to you.   Applying the monitor   Shave hair from upper left chest.   Hold abrader disc by orange tab.  Rub abrader in 40 strokes over left upper chest as indicated in your monitor instructions.   Clean area with 4 enclosed alcohol pads .  Use all pads to assure are is cleaned thoroughly.  Let dry.   Apply patch as indicated in monitor instructions.  Patch will be place under collarbone on left side of chest with arrow pointing upward.   Rub patch adhesive wings for 2 minutes.Remove white label marked "1".  Remove white label marked "2".  Rub patch adhesive wings for 2 additional minutes.   While looking in a mirror, press and release button in center of patch.  A small green light will flash 3-4 times .  This will be your only indicator the monitor has been turned on.     Do not shower for the first 24 hours.  You may shower after the first 24 hours.   Press button if you feel a symptom. You will hear a small click.  Record Date, Time and Symptom in the Patient Log Book.   When you are ready to remove patch, follow instructions on last 2 pages of Patient Log Book.  Stick patch monitor onto last page of Patient Log Book.   Place Patient Log Book in [place] box.  Use locking tab on box and tape  box closed securely.  The [place] and [employer] has [employer] on it.  Please place in mailbox as soon as possible.  Your physician should have your test results approximately 7 days after the monitor has been mailed back to [facility].   Call [place] at [phone] if you have questions regarding your ZIO XT patch monitor.  Call them immediately if you see an orange light blinking on your monitor.   If your monitor falls off in less than 4 days contact our Monitor department at [phone].  If your monitor becomes loose or falls off after 4 days call [employer] at [phone] for suggestions on  securing your monitor.     Follow-Up: At [facility], you and your health needs are our priority.  As part of our continuing mission to provide you with exceptional heart care, we have created designated Provider Care Teams.  These Care Teams include your primary Cardiologist (physician) and Advanced Practice Providers (APPs -  Physician Assistants and Nurse Practitioners) who all work together to provide you with the care you need, when you need it.  We recommend signing up for the patient portal called "MyChart".  Sign up information is provided on this After Visit Summary.  MyChart is used to connect with patients for Virtual Visits (Telemedicine).  Patients are able to view lab/test results, encounter notes, upcoming appointments, etc.  Non-urgent messages can be sent to your provider as well.   To learn more about what you can do with MyChart, go to [email or web address].    Your next appointment:   As needed  The format for your next appointment:   In Person  Provider:   Eleonore Chiquito, MD

## 2020-09-25 NOTE — Progress Notes (Signed)
Enrolled patient for a 7 day Zio XT Monitor to be mailed to patients home. She has been instructed to call [employer] and get hardship/selfpay quote.

## 2020-09-26 LAB — BRAIN NATRIURETIC PEPTIDE: BNP: 15.2 pg/mL (ref 0.0–100.0)

## 2020-09-26 LAB — TSH: TSH: 2.5 u[IU]/mL (ref 0.450–4.500)

## 2020-09-28 ENCOUNTER — Ambulatory Visit: Payer: No Typology Code available for payment source | Admitting: Radiation Oncology

## 2020-09-28 ENCOUNTER — Other Ambulatory Visit: Payer: Self-pay

## 2020-09-28 ENCOUNTER — Ambulatory Visit
Admission: RE | Admit: 2020-09-28 | Discharge: 2020-09-28 | Disposition: A | Discharge status: Home / Self Care | Payer: No Typology Code available for payment source | Source: Ambulatory Visit | Attending: Radiation Oncology | Admitting: Radiation Oncology

## 2020-09-29 ENCOUNTER — Ambulatory Visit
Admission: RE | Admit: 2020-09-29 | Discharge: 2020-09-29 | Disposition: A | Discharge status: Home / Self Care | Payer: No Typology Code available for payment source | Source: Ambulatory Visit | Attending: Radiation Oncology | Admitting: Radiation Oncology

## 2020-09-29 ENCOUNTER — Telehealth: Payer: Self-pay | Admitting: Cardiovascular Disease

## 2020-09-29 NOTE — Telephone Encounter (Signed)
    [name] with radiation oncology called, pt was there today and she wanted to know if she still need to wear the heart monitor since all of her symptoms are gone now. Per past encounter zio was mailed on 01/14. [name] said to call pt back for Dr. Kathalene Frames recommendation.

## 2020-09-29 NOTE — Telephone Encounter (Signed)
Spoke with patient and advised patient to wear heart monitor for the full 7 days if able. Advised if any further questions please call us back. Patient verbalized understanding.

## 2020-09-30 ENCOUNTER — Ambulatory Visit
Admission: RE | Admit: 2020-09-30 | Discharge: 2020-09-30 | Disposition: A | Discharge status: Home / Self Care | Payer: No Typology Code available for payment source | Source: Ambulatory Visit | Attending: Radiation Oncology | Admitting: Radiation Oncology

## 2020-09-30 DIAGNOSIS — C50412 Malignant neoplasm of upper-outer quadrant of left female breast: Secondary | ICD-10-CM

## 2020-09-30 DIAGNOSIS — Z170 Estrogen receptor positive status [ER+]: Secondary | ICD-10-CM

## 2020-09-30 MED ORDER — RADIAPLEXRX EX GEL: Freq: Once | CUTANEOUS | Status: AC

## 2020-10-01 ENCOUNTER — Ambulatory Visit
Admission: RE | Admit: 2020-10-01 | Discharge: 2020-10-01 | Disposition: A | Discharge status: Home / Self Care | Payer: No Typology Code available for payment source | Source: Ambulatory Visit | Attending: Radiation Oncology | Admitting: Radiation Oncology

## 2020-10-01 ENCOUNTER — Other Ambulatory Visit: Payer: Self-pay

## 2020-10-01 NOTE — Telephone Encounter (Signed)
Kaitlyn with Radiation Oncology states that the patient's zio monitor is on her radiation site. She states they have to remove it to continue her treatment and she has 7 more days left of radiotion. She would like to know if the patient can reattach it or if the monitor company will need to send her a new one. She states to call her direct number: [phone]

## 2020-10-01 NOTE — Telephone Encounter (Signed)
Informed [employer] has been contacted.  They will cancel original ZIO XT monitor and ship out a replacement.   She will inform patient she can apply replacement monitor next week after her radiation series is complete.  She will also inform patient to mail original monitor back to Agency,

## 2020-10-02 ENCOUNTER — Ambulatory Visit: Payer: No Typology Code available for payment source | Admitting: Radiation Oncology

## 2020-10-02 ENCOUNTER — Ambulatory Visit
Admission: RE | Admit: 2020-10-02 | Discharge: 2020-10-02 | Disposition: A | Discharge status: Home / Self Care | Payer: No Typology Code available for payment source | Source: Ambulatory Visit | Attending: Radiation Oncology | Admitting: Radiation Oncology

## 2020-10-05 ENCOUNTER — Ambulatory Visit (INDEPENDENT_AMBULATORY_CARE_PROVIDER_SITE_OTHER): Payer: Self-pay

## 2020-10-05 ENCOUNTER — Ambulatory Visit: Payer: No Typology Code available for payment source

## 2020-10-05 ENCOUNTER — Ambulatory Visit
Admission: RE | Admit: 2020-10-05 | Discharge: 2020-10-05 | Disposition: A | Discharge status: Home / Self Care | Payer: No Typology Code available for payment source | Source: Ambulatory Visit | Attending: Radiation Oncology | Admitting: Radiation Oncology

## 2020-10-05 ENCOUNTER — Other Ambulatory Visit: Payer: Self-pay | Admitting: *Deleted

## 2020-10-05 DIAGNOSIS — R002 Palpitations: Secondary | ICD-10-CM

## 2020-10-05 DIAGNOSIS — Z170 Estrogen receptor positive status [ER+]: Secondary | ICD-10-CM

## 2020-10-05 MED ORDER — RADIAPLEXRX EX GEL: Freq: Once | CUTANEOUS | Status: AC

## 2020-10-05 NOTE — Telephone Encounter (Signed)
Niochtka with [employer] is following up regarding replacement monitor. She states a new order will be needed for the new Zio monitor.  Phone #: [phone] (reference #: [id number])

## 2020-10-05 NOTE — Telephone Encounter (Signed)
Original order cancelled.   New order entered for replacement 7 day ZIO XT long term holter monitor to be shipped to patient.

## 2020-10-06 ENCOUNTER — Ambulatory Visit
Admission: RE | Admit: 2020-10-06 | Discharge: 2020-10-06 | Disposition: A | Discharge status: Home / Self Care | Payer: No Typology Code available for payment source | Source: Ambulatory Visit | Attending: Radiation Oncology | Admitting: Radiation Oncology

## 2020-10-07 ENCOUNTER — Ambulatory Visit
Admission: RE | Admit: 2020-10-07 | Discharge: 2020-10-07 | Disposition: A | Discharge status: Home / Self Care | Payer: No Typology Code available for payment source | Source: Ambulatory Visit | Attending: Radiation Oncology | Admitting: Radiation Oncology

## 2020-10-07 ENCOUNTER — Other Ambulatory Visit: Payer: Self-pay

## 2020-10-07 NOTE — Progress Notes (Signed)
[facility]  [phone] [phone]) [phone]     ID: [name] DOB: 10-24-1975  MR#: [id number]  ATF#:[id number]  Patient Care Team: Jacelyn Pi, Lilia Argue, MD as PCP - General (Family Medicine) Rolm Bookbinder, MD as Consulting Physician (General Surgery) Kaithlyn Teagle, Virgie Dad, MD as Consulting Physician (Oncology) Eppie Gibson, MD as Attending Physician (Radiation Oncology) Rockwell Germany, RN as Oncology Nurse Navigator Mauro Kaufmann, RN as Oncology Nurse Navigator Chauncey Cruel, MD OTHER MD:  CHIEF COMPLAINT: Triple negative breast cancer  CURRENT TREATMENT: Completing adjuvant radiation, to start pembrolizumab   INTERVAL HISTORY:  [name] returns today for follow up of her triple negative breast cancer.   Since her last visit, she reported chest pain and palpitations. She was seen by Dr. Audie Box in cardiology on 09/25/2020 and was given a heart monitor to wear. Lillyann reports her symptoms resolved by the time her monitor arrived in the mail on 09/29/2020, but she was still recommended to wear the heart monitor. However, the monitor was found to be in the radiation field. It was removed, and she will wear the monitor following treatment, which she is scheduled to finish on 10/09/2020.  She is also scheduled for echocardiogram on 10/21/2020.  Her port was not removed in light of possible pembrolizumab infusions to follow.  Her case was also reviewed at the 07/29/2020 breast cancer multidisciplinary conference and pembrolizumab plus or minus fluorouracil was recommended.   REVIEW OF SYSTEMS: Joss was not able to take the sensitizing fluorouracil more than 3 days because she had a series of unusual symptoms including palpitations and pain in the left side of the chest which have been variously evaluated.  At the last visit with me I started her on very Thomas-dose venlafaxine.  She says that has been very helpful.  She feels much less anxious, has  had no further palpitations and does not have chest pain at present.  She has had some skin redness and some peeling but no significant fatigue from the radiation which ends tomorrow.  Detailed review of systems was otherwise stable   COVID 19 VACCINATION STATUS: She had the J&J vaccine x1., infection 06/20/2020, and Pfizer booster on 08/19/2020   HISTORY OF CURRENT ILLNESS: From the original intake note:  [name] herself palpated a left beast lump with associated pain. She underwent bilateral diagnostic mammography with tomography and left breast ultrasonography at [facility] on 01/23/2020 showing: breast density category B; palpable 3.6 cm left breast mass with associated calcifications at 1 o'clock; 0.8 cm left breast mass at 2 o'clock, concerning for abnormal intramammary lymph node; at least two abnormal left axillary lymph nodes.  Accordingly on 01/23/2020 she proceeded to biopsy of the left breast area in question. The pathology from this procedure ([id number]) showed: invasive ductal carcinoma at 1 o'clock, grade 3. Prognostic indicators significant for: estrogen receptor, 20% positive with moderate staining intensity and progesterone receptor, 0% negative. Proliferation marker Ki67 at 40%. HER2 negative by immunohistochemistry (1+).  The left axillary lymph node confirmed metastatic carcinoma.  The left intramammary lymph node was negative for carcinoma.  The patient's subsequent history is as detailed below.   PAST MEDICAL HISTORY: Past Medical History:  Diagnosis Date  . Allergy   . Asthma   . Breast cancer in female [facility])    Left  . Chronic back pain   . History of breast cancer   . Hx of migraines   . Morbid obesity ([place])  PAST SURGICAL HISTORY: Past Surgical History:  Procedure Laterality Date  . BREAST LUMPECTOMY WITH RADIOACTIVE SEED AND SENTINEL LYMPH NODE BIOPSY Left 07/15/2020   Procedure: LEFT BREAST LUMPECTOMY WITH RADIOACTIVE SEED AND LEFT  AXILLARY SENTINEL LYMPH NODE BIOPSY, LEFT AXILLARY NODE RADIOACTIVE SEED GUIDED EXCISION, LEFT BREAST RADIOACTIVE SEED GUIDED EXCISION IM NODE;  Surgeon: Rolm Bookbinder, MD;  Location: [facility];  Service: General;  Laterality: Left;  PEC BLOCK  . CESAREAN SECTION WITH BILATERAL TUBAL LIGATION Bilateral 07/23/2015   Procedure: CESAREAN SECTION WITH BILATERAL TUBAL LIGATION;  Surgeon: Donnamae Jude, MD;  Location: [facility];  Service: Obstetrics;  Laterality: Bilateral;  . PORTACATH PLACEMENT Right 02/11/2020   Procedure: INSERTION PORT-A-CATH WITH ULTRASOUND GUIDANCE;  Surgeon: Rolm Bookbinder, MD;  Location: [facility];  Service: General;  Laterality: Right;  . TUBAL LIGATION      FAMILY HISTORY: Family History  Problem Relation Age of Onset  . Heart disease Maternal Grandmother    Her parents are both living as of 01/2020, her father age 4 and her mother age 45. She has three maternal half-siblings (1 sister, 2 brothers) and four paternal half-siblings (1 sister, 3 brothers). There is no family history of cancer to her knowledge.   GYNECOLOGIC HISTORY:  Patient's last menstrual period was 01/20/2020 (exact date). Menarche: 45 years old Age at first live birth: 45 years old [place] P 3 LMP 01/2020 Contraceptive s/p tubal ligation HRT n/a  Hysterectomy? no BSO? 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 Husband [name] works for a Financial trader, both in [employer]. They are both from [place]. She lives at home with Napoleon and their three children-- [name], [name], and [name]. She attends an [employer].    ADVANCED DIRECTIVES: In the absence of any documentation to the contrary, the patient's spouse is their HCPOA.    HEALTH MAINTENANCE: Social History   Tobacco Use  . Smoking status: Never Smoker  . Smokeless tobacco: Never Used  Vaping Use  . Vaping Use:  Never used  Substance Use Topics  . Alcohol use: No  . Drug use: No     Colonoscopy: n/a (age)  PAP: 11/2017, negative (per patient)  Bone density: n/a (age)   Allergies  Allergen Reactions  . Doxorubicin Hcl Rash    Experience chest tightness, abdominal pain, and redness around lips during and shortly after doxorubicin bolus.     Current Outpatient Medications  Medication Sig Dispense Refill  . venlafaxine XR (EFFEXOR-XR) 37.5 MG 24 hr capsule Take 1 capsule (37.5 mg total) by mouth daily with breakfast. 90 capsule 4   No current facility-administered medications for this visit.    OBJECTIVE: Spanish speaker who appears stated age  30:   10/08/20 1217  BP: (!) 128/55  Pulse: 76  Resp: 18  Temp: (!) 97.5 F (36.4 C)  SpO2: 100%     Body mass index is 37.4 kg/m.   Wt Readings from Last 3 Encounters:  10/08/20 204 lb 8 oz (92.8 kg)  09/25/20 205 lb 12.8 oz (93.4 kg)  09/21/20 206 lb (93.4 kg)      ECOG FS:1 - Symptomatic but completely ambulatory  Sclerae unicteric, EOMs intact Wearing a mask No cervical or supraclavicular adenopathy Lungs no rales or rhonchi Heart regular rate and rhythm Abd soft, nontender, positive bowel sounds MSK no focal spinal tenderness, no upper extremity lymphedema Neuro: nonfocal, well oriented, appropriate  affect Breasts: The right breast is benign.  The left breast is status post lumpectomy and completing radiation.  There is erythema, minimal dry desquamation, and the breast is slightly swollen although overall slightly shorter than the right breast.  There is no evidence of residual recurrent disease.  Both axillae are benign   LAB RESULTS:  CMP     Component Value Date/Time   NA 137 09/21/2020 1825   NA 138 08/15/2017 1525   K 3.7 09/21/2020 1825   CL 102 09/21/2020 1825   CO2 26 09/21/2020 1825   GLUCOSE 101 (H) 09/21/2020 1825   BUN 12 09/21/2020 1825   BUN 12 08/15/2017 1525   CREATININE 0.67 09/21/2020 1825    CREATININE 0.77 02/26/2020 0915   CALCIUM 9.6 09/21/2020 1825   PROT 7.2 08/05/2020 1244   PROT 7.4 01/03/2017 1642   ALBUMIN 3.7 08/05/2020 1244   ALBUMIN 4.3 01/03/2017 1642   AST 23 08/05/2020 1244   AST 17 02/26/2020 0915   ALT 26 08/05/2020 1244   ALT 21 02/26/2020 0915   ALKPHOS 104 08/05/2020 1244   BILITOT 0.3 08/05/2020 1244   BILITOT <0.2 (L) 02/26/2020 0915   GFRNONAA >60 09/21/2020 1825   GFRNONAA >60 02/26/2020 0915   GFRAA >60 06/03/2020 0949   GFRAA >60 02/26/2020 0915    No results found for: TOTALPROTELP, ALBUMINELP, A1GS, A2GS, BETS, BETA2SER, GAMS, MSPIKE, SPEI  Lab Results  Component Value Date   WBC 2.9 (L) 10/08/2020   NEUTROABS 1.9 10/08/2020   HGB 11.8 (L) 10/08/2020   HCT 36.1 10/08/2020   MCV 84.1 10/08/2020   PLT 192 10/08/2020    No results found for: LABCA2  No components found for: SEGBTD176  No results for input(s): INR in the last 168 hours.  No results found for: LABCA2  No results found for: HYW737  No results found for: TGG269  No results found for: SWN462  No results found for: CA2729  No components found for: HGQUANT  No results found for: CEA1 / No results found for: CEA1   No results found for: AFPTUMOR  No results found for: CHROMOGRNA  No results found for: KPAFRELGTCHN, LAMBDASER, KAPLAMBRATIO (kappa/lambda light chains)  No results found for: HGBA, HGBA2QUANT, HGBFQUANT, HGBSQUAN (Hemoglobinopathy evaluation)   No results found for: LDH  No results found for: IRON, TIBC, IRONPCTSAT (Iron and TIBC)  No results found for: FERRITIN  Urinalysis No results found for: COLORURINE, APPEARANCEUR, LABSPEC, PHURINE, GLUCOSEU, HGBUR, BILIRUBINUR, KETONESUR, PROTEINUR, UROBILINOGEN, NITRITE, LEUKOCYTESUR   STUDIES: No results found.   ELIGIBLE FOR AVAILABLE RESEARCH PROTOCOL: [phone]?  ASSESSMENT: 45 y.o. [place] woman status post left breast upper outer quadrant biopsy 01/23/2020 for a clinical T3 N1, stage  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  (a) chest CT scan and bone scan 02/07/2020 showed no evidence of metastatic disease  (1) neoadjuvant chemotherapy consisting of doxorubicin and cyclophosphamide in dose dense fashion x4 started 02/12/2020, completed 03/25/2020, followed by paclitaxel and carboplatin weekly x12 started 04/08/2020 and completed on 05/28/2020  (a) echo 02/07/2020 shows an ejection fraction in the 55-60% range  (b) Epirubicin substituted for Doxorubicin secondary to allergic rash from Doxorubicin  (2) status post left lumpectomy and sentinel lymph node sampling 07/15/2020 for a ypT1b ypN0 residual invasive ductal carcinoma, grade 3, with negative margins.  (a) a total of 4 left axillary lymph nodes were removed  (b) repeat prognostic panel on the final pathology again triple negative, with  an MIB-1 of 50%.  (3) adjuvant radiation completed 10/09/2020  (a) sensitizing capecitabine attempted but not tolerated by the patient  (4) genetics testing 02/07/2020 through the [facility] Common Hereditary Cancers Panel found no deleterious mutations in  APC, ATM, AXIN2, BARD1, BMPR1A, BRCA1, BRCA2, BRIP1, CDH1, CDKN2A (p14ARF), CDKN2A (p16INK4a), CKD4, CHEK2, CTNNA1, DICER1, EPCAM (Deletion/duplication testing only), GREM1 (promoter region deletion/duplication testing only), KIT, MEN1, MLH1, MSH2, MSH3, MSH6, MUTYH, NBN, NF1, NHTL1, PALB2, PDGFRA, PMS2, POLD1, POLE, PTEN, RAD50, RAD51C, RAD51D, RNF43, SDHB, SDHC, SDHD, SMAD4, SMARCA4. STK11, TP53, TSC1, TSC2, and VHL.  The following genes were evaluated for sequence changes only: SDHA and HOXB13 c.251G>A variant only.  (a) VUS in MSH6 called c.2744C>G identified.   (5)  adjuvant pertuzumab to start 10/21/2020, to continue for 1 year   PLAN: Redell has completed her chemotherapy and will complete her radiation tomorrow.  She no longer is experiencing palpitations but in any case she will  wear a monitor for 7 days beginning on 10/10/2020.  Anticipate no significant findings there.  If that is so then we will start pembrolizumab the following Wednesday, 10/21/2020.  Today we discussed the possible benefits as well as the possible toxicities side effects and complications of that agent.  I am also refilling her EMLA cream.  She wanted to know if she was cancer free and we discussed the difference in meeting between being cancer free and being in remission.  She understands the reason why she needs additional therapy.  Total encounter time 25 minutes.Sarajane Jews C. Deysi Soldo, MD 10/08/20 12:22 PM Medical Oncology and Hematology [facility] [place], [place] [address] Tel. [phone]    Fax. [phone]   I, Wilburn Mylar, am acting as scribe for Dr. Virgie Dad. Rees Santistevan.  I, Lurline Del MD, have reviewed the above documentation for accuracy and completeness, and I agree with the above.   *Total Encounter Time as defined by the Centers for Medicare and Medicaid Services includes, in addition to the face-to-face time of a patient visit (documented in the note above) non-face-to-face time: obtaining and reviewing outside history, ordering and reviewing medications, tests or procedures, care coordination (communications with other health care professionals or caregivers) and documentation in the medical record.

## 2020-10-08 ENCOUNTER — Other Ambulatory Visit: Payer: Self-pay | Admitting: Oncology

## 2020-10-08 ENCOUNTER — Inpatient Hospital Stay: Payer: No Typology Code available for payment source

## 2020-10-08 ENCOUNTER — Ambulatory Visit: Payer: No Typology Code available for payment source

## 2020-10-08 ENCOUNTER — Encounter: Payer: Self-pay | Admitting: *Deleted

## 2020-10-08 ENCOUNTER — Ambulatory Visit
Admission: RE | Admit: 2020-10-08 | Discharge: 2020-10-08 | Disposition: A | Discharge status: Home / Self Care | Payer: No Typology Code available for payment source | Source: Ambulatory Visit | Attending: Radiation Oncology | Admitting: Radiation Oncology

## 2020-10-08 ENCOUNTER — Inpatient Hospital Stay (HOSPITAL_BASED_OUTPATIENT_CLINIC_OR_DEPARTMENT_OTHER): Payer: No Typology Code available for payment source | Admitting: Oncology

## 2020-10-08 ENCOUNTER — Other Ambulatory Visit: Payer: Self-pay

## 2020-10-08 VITALS — BP 128/55 | HR 76 | Temp 97.5°F | Resp 18 | Ht 62.0 in | Wt 204.5 lb

## 2020-10-08 DIAGNOSIS — Z95828 Presence of other vascular implants and grafts: Secondary | ICD-10-CM

## 2020-10-08 DIAGNOSIS — Z170 Estrogen receptor positive status [ER+]: Secondary | ICD-10-CM

## 2020-10-08 DIAGNOSIS — C50412 Malignant neoplasm of upper-outer quadrant of left female breast: Secondary | ICD-10-CM

## 2020-10-08 LAB — CBC WITH DIFFERENTIAL/PLATELET
Abs Immature Granulocytes: 0.02 10*3/uL (ref 0.00–0.07)
Basophils Absolute: 0 10*3/uL (ref 0.0–0.1)
Basophils Relative: 0 %
Eosinophils Absolute: 0 10*3/uL (ref 0.0–0.5)
Eosinophils Relative: 1 %
HCT: 36.1 % (ref 36.0–46.0)
Hemoglobin: 11.8 g/dL — ABNORMAL LOW (ref 12.0–15.0)
Immature Granulocytes: 1 %
Lymphocytes Relative: 25 %
Lymphs Abs: 0.7 10*3/uL (ref 0.7–4.0)
MCH: 27.5 pg (ref 26.0–34.0)
MCHC: 32.7 g/dL (ref 30.0–36.0)
MCV: 84.1 fL (ref 80.0–100.0)
Monocytes Absolute: 0.3 10*3/uL (ref 0.1–1.0)
Monocytes Relative: 9 %
Neutro Abs: 1.9 10*3/uL (ref 1.7–7.7)
Neutrophils Relative %: 64 %
Platelets: 192 10*3/uL (ref 150–400)
RBC: 4.29 MIL/uL (ref 3.87–5.11)
RDW: 13.6 % (ref 11.5–15.5)
WBC: 2.9 10*3/uL — ABNORMAL LOW (ref 4.0–10.5)
nRBC: 0 % (ref 0.0–0.2)

## 2020-10-08 LAB — COMPREHENSIVE METABOLIC PANEL
ALT: 24 U/L (ref 0–44)
AST: 22 U/L (ref 15–41)
Albumin: 3.7 g/dL (ref 3.5–5.0)
Alkaline Phosphatase: 93 U/L (ref 38–126)
Anion gap: 6 (ref 5–15)
BUN: 13 mg/dL (ref 6–20)
CO2: 26 mmol/L (ref 22–32)
Calcium: 9.2 mg/dL (ref 8.9–10.3)
Chloride: 106 mmol/L (ref 98–111)
Creatinine, Ser: 0.74 mg/dL (ref 0.44–1.00)
GFR, Estimated: >60 mL/min (ref >60)
Glucose, Bld: 137 mg/dL — ABNORMAL HIGH (ref 70–99)
Potassium: 3.6 mmol/L (ref 3.5–5.1)
Sodium: 138 mmol/L (ref 135–145)
Total Bilirubin: 0.3 mg/dL (ref 0.3–1.2)
Total Protein: 7 g/dL (ref 6.5–8.1)

## 2020-10-08 MED ORDER — LIDOCAINE-PRILOCAINE 2.5-2.5 % EX CREA: TOPICAL_CREAM | CUTANEOUS | 3 refills | Status: DC

## 2020-10-08 MED ORDER — HEPARIN SOD (PORK) LOCK FLUSH 100 UNIT/ML IV SOLN
500.0000 [IU] | Freq: Once | INTRAVENOUS | Status: AC
  Administered 2020-10-08: 500 [IU]
  Filled 2020-10-08: qty 5

## 2020-10-08 MED ORDER — SODIUM CHLORIDE 0.9% FLUSH
10.0000 mL | Freq: Once | INTRAVENOUS | Status: AC
  Administered 2020-10-08: 10 mL
  Filled 2020-10-08: qty 10

## 2020-10-08 NOTE — Patient Instructions (Signed)

## 2020-10-09 ENCOUNTER — Encounter: Payer: Self-pay | Admitting: Radiation Oncology

## 2020-10-09 ENCOUNTER — Other Ambulatory Visit: Payer: Self-pay

## 2020-10-09 ENCOUNTER — Ambulatory Visit
Admission: RE | Admit: 2020-10-09 | Discharge: 2020-10-09 | Disposition: A | Discharge status: Home / Self Care | Payer: No Typology Code available for payment source | Source: Ambulatory Visit | Attending: Radiation Oncology | Admitting: Radiation Oncology

## 2020-10-21 ENCOUNTER — Ambulatory Visit (HOSPITAL_COMMUNITY): Payer: Self-pay | Attending: Cardiovascular Disease

## 2020-10-21 ENCOUNTER — Other Ambulatory Visit: Payer: Self-pay

## 2020-10-21 DIAGNOSIS — R079 Chest pain, unspecified: Secondary | ICD-10-CM

## 2020-10-21 LAB — ECHOCARDIOGRAM COMPLETE
Area-P 1/2: 2.82 cm2
S' Lateral: 3.4 cm

## 2020-10-26 ENCOUNTER — Ambulatory Visit: Payer: No Typology Code available for payment source | Attending: Oncology

## 2020-10-26 ENCOUNTER — Other Ambulatory Visit: Payer: Self-pay

## 2020-10-26 DIAGNOSIS — Z483 Aftercare following surgery for neoplasm: Secondary | ICD-10-CM

## 2020-10-26 NOTE — Therapy (Signed)
[place], [place], [address] Phone: [phone]   Fax:  [phone]  Physical Therapy Treatment  Patient Details  Name: [name] MRN: [id number] Date of Birth: Aug 09, 1976 Referring Provider (PT): Dr. Rolm Bookbinder   Encounter Date: 10/26/2020   PT End of Session - 10/26/20 0825    Visit Number 2    Number of Visits 2    Date for PT Re-Evaluation 07/31/20    PT Start Time 0816    PT Stop Time 0826    PT Time Calculation (min) 10 min    Activity Tolerance Patient tolerated treatment well    Behavior During Therapy [facility] for tasks assessed/performed           Past Medical History:  Diagnosis Date  . Allergy   . Asthma   . Breast cancer in female [facility])    Left  . Chronic back pain   . History of breast cancer   . Hx of migraines   . Morbid obesity ([place])     Past Surgical History:  Procedure Laterality Date  . BREAST LUMPECTOMY WITH RADIOACTIVE SEED AND SENTINEL LYMPH NODE BIOPSY Left 07/15/2020   Procedure: LEFT BREAST LUMPECTOMY WITH RADIOACTIVE SEED AND LEFT AXILLARY SENTINEL LYMPH NODE BIOPSY, LEFT AXILLARY NODE RADIOACTIVE SEED GUIDED EXCISION, LEFT BREAST RADIOACTIVE SEED GUIDED EXCISION IM NODE;  Surgeon: Rolm Bookbinder, MD;  Location: [facility];  Service: General;  Laterality: Left;  PEC BLOCK  . CESAREAN SECTION WITH BILATERAL TUBAL LIGATION Bilateral 07/23/2015   Procedure: CESAREAN SECTION WITH BILATERAL TUBAL LIGATION;  Surgeon: Donnamae Jude, MD;  Location: [facility];  Service: Obstetrics;  Laterality: Bilateral;  . PORTACATH PLACEMENT Right 02/11/2020   Procedure: INSERTION PORT-A-CATH WITH ULTRASOUND GUIDANCE;  Surgeon: Rolm Bookbinder, MD;  Location: [facility];  Service: General;  Laterality: Right;  . TUBAL LIGATION      There were no vitals filed for this visit.   Subjective Assessment - 10/26/20 0820    Subjective Pt returns for 3 month L-Dex  screen.    Pertinent History Patient was diagnosed on 01/23/2020 with left grade III invasive ductal carcinoma breast cancer. It is ER positive, PR negative, and HER2 negative with a Ki67 of 40%. She has 2 abnormal appearing lymph nodes and 1 was biopsied and found to be positive at time of diagnosis. Patient with interpreter present. She underwent neoadjuvant chemotherapy from 02/12/2020 - 05/28/2020. 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 She underwent a left lumpectomy and sentinel node biopsy ( 4 negative nodes) on 07/15/2020. She will undergo radiation and anti-estrogen therapy. She reports her neuropathy has resolved and has some limitation in shoulder ROM.                  L-DEX FLOWSHEETS - 10/26/20 0800      L-DEX LYMPHEDEMA SCREENING   Measurement Type Unilateral    L-DEX MEASUREMENT EXTREMITY Upper Extremity    POSITION  Standing    DOMINANT SIDE Right    At Risk Side Left    BASELINE SCORE (UNILATERAL) 3    L-DEX SCORE (UNILATERAL) 2.3    VALUE CHANGE (UNILAT) -0.7                                  PT Long Term Goals - 08/10/20 1400      PT LONG TERM GOAL #  1   Title Patient will be able to demonstrate she has regained full shoulder ROM and function post operatively compared to baselines.    Time 6    Period Months    Status Achieved                 Plan - 10/26/20 7001    Clinical Impression Statement Pt returns for her 3 month L-Dex screen. Her change from baseline of -0.7 is WNLs so no further treatment is required at this time except to cont every 3 month L-Dex screens which pt is agreeable to .    PT Next Visit Plan Cont every 3 month L-Dex screens for up to 2 years from [facility].    Consulted and Agree with Plan of Care Patient;Other (Comment)   interpreter          Patient will benefit from skilled therapeutic intervention in order to improve the following deficits and impairments:     Visit  Diagnosis: Aftercare following surgery for neoplasm     Problem List Patient Active Problem List   Diagnosis Date Noted  . Morbid obesity ([place])   . History of breast cancer   . Port-A-Cath in place 03/25/2020  . Genetic testing 02/13/2020  . Malignant neoplasm of upper-outer quadrant of left breast in female, estrogen receptor positive ([place]) 01/27/2020  . SOB (shortness of breath) 01/10/2017  . Pregnancy 07/23/2015  . Previous cesarean delivery, antepartum condition or complication 74/94/4967  . Encounter for sterilization 07/08/2015  . Advanced maternal age in multigravida   . Impaired ability to use community resources due to language barrier 07/13/2013    Otelia Limes, PTA 10/26/2020, 8:28 AM  [place], [place], [address] Phone: [phone]   Fax:  [phone]  Name: [name] MRN: [id number] Date of Birth: Feb 21, 1976

## 2020-10-28 ENCOUNTER — Telehealth: Payer: Self-pay | Admitting: Oncology

## 2020-10-28 NOTE — Telephone Encounter (Signed)
Scheduled appt per 2/16 sch msg - left message for patient with appt date and time   interp ID # -  [id number] [facility]

## 2020-10-30 ENCOUNTER — Telehealth: Payer: Self-pay

## 2020-10-30 NOTE — Telephone Encounter (Signed)
Patient notified that appointment for 2/21 has been cancelled due to insurance.  Awaiting pharmacy to provide feedback regarding financial assistance.

## 2020-11-02 ENCOUNTER — Other Ambulatory Visit: Payer: No Typology Code available for payment source

## 2020-11-02 ENCOUNTER — Other Ambulatory Visit: Payer: Self-pay | Admitting: Oncology

## 2020-11-02 ENCOUNTER — Ambulatory Visit: Payer: No Typology Code available for payment source

## 2020-11-02 NOTE — Progress Notes (Unsigned)
[name]  [phone] [phone]     ID: [name] DOB: May 25, 1976  MR#: [id number]  FHL#:[id number]  Patient Care Team: Jacelyn Pi, Lilia Argue, MD as PCP - General (Family Medicine) Rolm Bookbinder, MD as Consulting Physician (General Surgery) Treydon Henricks, Virgie Dad, MD as Consulting Physician (Oncology) Eppie Gibson, MD as Attending Physician (Radiation Oncology) Rockwell Germany, RN as Oncology Nurse Navigator Mauro Kaufmann, RN as Oncology Nurse Navigator Chauncey Cruel, MD OTHER MD:  CHIEF COMPLAINT: Triple negative breast cancer  CURRENT TREATMENT: Completing adjuvant radiation, to start pembrolizumab   INTERVAL HISTORY:  [name] returns today for follow up of her triple negative breast cancer.   Since her last visit, she reported chest pain and palpitations. She was seen by Dr. Audie Box in cardiology on 09/25/2020 and was given a heart monitor to wear. Elanda reports her symptoms resolved by the time her monitor arrived in the mail on 09/29/2020, but she was still recommended to wear the heart monitor. However, the monitor was found to be in the radiation field. It was removed, and she will wear the monitor following treatment, which she is scheduled to finish on 10/09/2020.  She is also scheduled for echocardiogram on 10/21/2020.  Her port was not removed in light of possible pembrolizumab infusions to follow.  Her case was also reviewed at the 07/29/2020 breast cancer multidisciplinary conference and pembrolizumab plus or minus fluorouracil was recommended.   REVIEW OF SYSTEMS: Adonis was not able to take the sensitizing fluorouracil more than 3 days because she had a series of unusual symptoms including palpitations and pain in the left side of the chest which have been variously evaluated.  At the last visit with me I started her on very low-dose venlafaxine.  She says that has been very helpful.  She feels much less anxious, has  had no further palpitations and does not have chest pain at present.  She has had some skin redness and some peeling but no significant fatigue from the radiation which ends tomorrow.  Detailed review of systems was otherwise stable   COVID 19 VACCINATION STATUS: She had the J&J vaccine x1., infection 06/20/2020, and Pfizer booster on 08/19/2020   HISTORY OF CURRENT ILLNESS: From the original intake note:  [name] herself palpated a left beast lump with associated pain. She underwent bilateral diagnostic mammography with tomography and left breast ultrasonography at [facility] on 01/23/2020 showing: breast density category B; palpable 3.6 cm left breast mass with associated calcifications at 1 o'clock; 0.8 cm left breast mass at 2 o'clock, concerning for abnormal intramammary lymph node; at least two abnormal left axillary lymph nodes.  Accordingly on 01/23/2020 she proceeded to biopsy of the left breast area in question. The pathology from this procedure ([id number]) showed: invasive ductal carcinoma at 1 o'clock, grade 3. Prognostic indicators significant for: estrogen receptor, 20% positive with moderate staining intensity and progesterone receptor, 0% negative. Proliferation marker Ki67 at 40%. HER2 negative by immunohistochemistry (1+).  The left axillary lymph node confirmed metastatic carcinoma.  The left intramammary lymph node was negative for carcinoma.  The patient's subsequent history is as detailed below.   PAST MEDICAL HISTORY: Past Medical History:  Diagnosis Date  . Allergy   . Asthma   . Breast cancer in female [facility])    Left  . Chronic back pain   . History of breast cancer   . Hx of migraines   . Morbid obesity ([place])  PAST SURGICAL HISTORY: Past Surgical History:  Procedure Laterality Date  . BREAST LUMPECTOMY WITH RADIOACTIVE SEED AND SENTINEL LYMPH NODE BIOPSY Left 07/15/2020   Procedure: LEFT BREAST LUMPECTOMY WITH RADIOACTIVE SEED AND LEFT  AXILLARY SENTINEL LYMPH NODE BIOPSY, LEFT AXILLARY NODE RADIOACTIVE SEED GUIDED EXCISION, LEFT BREAST RADIOACTIVE SEED GUIDED EXCISION IM NODE;  Surgeon: Rolm Bookbinder, MD;  Location: [facility];  Service: General;  Laterality: Left;  PEC BLOCK  . CESAREAN SECTION WITH BILATERAL TUBAL LIGATION Bilateral 07/23/2015   Procedure: CESAREAN SECTION WITH BILATERAL TUBAL LIGATION;  Surgeon: Donnamae Jude, MD;  Location: [facility];  Service: Obstetrics;  Laterality: Bilateral;  . PORTACATH PLACEMENT Right 02/11/2020   Procedure: INSERTION PORT-A-CATH WITH ULTRASOUND GUIDANCE;  Surgeon: Rolm Bookbinder, MD;  Location: [place];  Service: General;  Laterality: Right;  . TUBAL LIGATION      FAMILY HISTORY: Family History  Problem Relation Age of Onset  . Heart disease Maternal Grandmother    Her parents are both living as of 01/2020, her father age 30 and her mother age 59. She has three maternal half-siblings (1 sister, 2 brothers) and four paternal half-siblings (1 sister, 3 brothers). There is no family history of cancer to her knowledge.   GYNECOLOGIC HISTORY:  Patient's last menstrual period was 01/20/2020 (exact date). Menarche: 45 years old Age at first live birth: 45 years old [place] P 3 LMP 01/2020 Contraceptive s/p tubal ligation HRT n/a  Hysterectomy? no BSO? 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 Husband [name] works for a Financial trader, both in [employer]. They are both from [place]. She lives at home with [place] and their three children-- [name], [name], and [name]. She attends an [employer].    ADVANCED DIRECTIVES: In the absence of any documentation to the contrary, the patient's spouse is their HCPOA.    HEALTH MAINTENANCE: Social History   Tobacco Use  . Smoking status: Never Smoker  . Smokeless tobacco: Never Used  Vaping Use  . Vaping Use:  Never used  Substance Use Topics  . Alcohol use: No  . Drug use: No     Colonoscopy: n/a (age)  PAP: 11/2017, negative (per patient)  Bone density: n/a (age)   Allergies  Allergen Reactions  . Doxorubicin Hcl Rash    Experience chest tightness, abdominal pain, and redness around lips during and shortly after doxorubicin bolus.     Current Outpatient Medications  Medication Sig Dispense Refill  . lidocaine-prilocaine (EMLA) cream APPLY  CREAM TOPICALLY TO AFFECTED AREA ONCE 30 g 3  . venlafaxine XR (EFFEXOR-XR) 37.5 MG 24 hr capsule Take 1 capsule (37.5 mg total) by mouth daily with breakfast. 90 capsule 4   No current facility-administered medications for this visit.    OBJECTIVE: Spanish speaker who appears stated age  There were no vitals filed for this visit.   There is no height or weight on file to calculate BMI.   Wt Readings from Last 3 Encounters:  10/08/20 204 lb 8 oz (92.8 kg)  09/25/20 205 lb 12.8 oz (93.4 kg)  09/21/20 206 lb (93.4 kg)      ECOG FS:1 - Symptomatic but completely ambulatory  Sclerae unicteric, EOMs intact Wearing a mask No cervical or supraclavicular adenopathy Lungs no rales or rhonchi Heart regular rate and rhythm Abd soft, nontender, positive bowel sounds MSK no focal spinal tenderness, no upper extremity lymphedema Neuro: nonfocal, well  oriented, appropriate affect Breasts: The right breast is benign.  The left breast is status post lumpectomy and completing radiation.  There is erythema, minimal dry desquamation, and the breast is slightly swollen although overall slightly shorter than the right breast.  There is no evidence of residual recurrent disease.  Both axillae are benign   LAB RESULTS:  CMP     Component Value Date/Time   NA 138 10/08/2020 1204   NA 138 08/15/2017 1525   K 3.6 10/08/2020 1204   CL 106 10/08/2020 1204   CO2 26 10/08/2020 1204   GLUCOSE 137 (H) 10/08/2020 1204   BUN 13 10/08/2020 1204   BUN 12 08/15/2017  1525   CREATININE 0.74 10/08/2020 1204   CREATININE 0.77 02/26/2020 0915   CALCIUM 9.2 10/08/2020 1204   PROT 7.0 10/08/2020 1204   PROT 7.4 01/03/2017 1642   ALBUMIN 3.7 10/08/2020 1204   ALBUMIN 4.3 01/03/2017 1642   AST 22 10/08/2020 1204   AST 17 02/26/2020 0915   ALT 24 10/08/2020 1204   ALT 21 02/26/2020 0915   ALKPHOS 93 10/08/2020 1204   BILITOT 0.3 10/08/2020 1204   BILITOT <0.2 (L) 02/26/2020 0915   GFRNONAA >60 10/08/2020 1204   GFRNONAA >60 02/26/2020 0915   GFRAA >60 06/03/2020 0949   GFRAA >60 02/26/2020 0915    No results found for: TOTALPROTELP, ALBUMINELP, A1GS, A2GS, BETS, BETA2SER, GAMS, MSPIKE, SPEI  Lab Results  Component Value Date   WBC 2.9 (L) 10/08/2020   NEUTROABS 1.9 10/08/2020   HGB 11.8 (L) 10/08/2020   HCT 36.1 10/08/2020   MCV 84.1 10/08/2020   PLT 192 10/08/2020    No results found for: LABCA2  No components found for: EYCXKG818  No results for input(s): INR in the last 168 hours.  No results found for: LABCA2  No results found for: HUD149  No results found for: FWY637  No results found for: CHY850  No results found for: CA2729  No components found for: HGQUANT  No results found for: CEA1 / No results found for: CEA1   No results found for: AFPTUMOR  No results found for: CHROMOGRNA  No results found for: KPAFRELGTCHN, LAMBDASER, KAPLAMBRATIO (kappa/lambda light chains)  No results found for: HGBA, HGBA2QUANT, HGBFQUANT, HGBSQUAN (Hemoglobinopathy evaluation)   No results found for: LDH  No results found for: IRON, TIBC, IRONPCTSAT (Iron and TIBC)  No results found for: FERRITIN  Urinalysis No results found for: COLORURINE, APPEARANCEUR, LABSPEC, PHURINE, GLUCOSEU, HGBUR, BILIRUBINUR, KETONESUR, PROTEINUR, UROBILINOGEN, NITRITE, LEUKOCYTESUR   STUDIES: ECHOCARDIOGRAM COMPLETE  Result Date: 10/21/2020    ECHOCARDIOGRAM REPORT   Patient Name:   LOVEDA COLAIZZI [facility] Date of Exam: 10/21/2020 Medical Rec #:   [id number]             Height:       62.0 in Accession #:    [id number]            Weight:       204.5 lb Date of Birth:  1976/04/22             BSA:          1.930 m Patient Age:    22 years              BP:           128/55 mmHg Patient Gender: F                     HR:  73 bpm. Exam Location:  [facility] Procedure: 2D Thomas, Cardiac Doppler and Color Doppler Indications:    R07.9* Chest pain, unspecified  History:        Patient has prior history of Echocardiogram examinations, most                 recent 02/07/2020. Signs/Symptoms:Shortness of Breath. History of                 breast cancer s/p chemotherapy/lumpectomy. palpitations.                 Obesity. Chronic low back pain.  Sonographer:    Diamond Nickel RCS Referring Phys: [id number] [place]  1. Left ventricular ejection fraction, by estimation, is 60 to 65%. The left ventricle has normal function. The left ventricle has no regional wall motion abnormalities. Left ventricular diastolic parameters were normal.  2. Right ventricular systolic function is normal. The right ventricular size is normal.  3. The mitral valve is normal in structure. No evidence of mitral valve regurgitation. No evidence of mitral stenosis.  4. The aortic valve is normal in structure. Aortic valve regurgitation is not visualized. No aortic stenosis is present.  5. The inferior vena cava is normal in size with greater than 50% respiratory variability, suggesting right atrial pressure of 3 mmHg. FINDINGS  Left Ventricle: Left ventricular ejection fraction, by estimation, is 60 to 65%. The left ventricle has normal function. The left ventricle has no regional wall motion abnormalities. The left ventricular internal cavity size was normal in size. There is  no left ventricular hypertrophy. Left ventricular diastolic parameters were normal. Normal left ventricular filling pressure. Right Ventricle: The right ventricular size is normal. No increase in  right ventricular wall thickness. Right ventricular systolic function is normal. Left Atrium: Left atrial size was normal in size. Right Atrium: Right atrial size was normal in size. Pericardium: There is no evidence of pericardial effusion. Mitral Valve: The mitral valve is normal in structure. No evidence of mitral valve regurgitation. No evidence of mitral valve stenosis. Tricuspid Valve: The tricuspid valve is normal in structure. Tricuspid valve regurgitation is not demonstrated. No evidence of tricuspid stenosis. Aortic Valve: The aortic valve is normal in structure. Aortic valve regurgitation is not visualized. No aortic stenosis is present. Pulmonic Valve: The pulmonic valve was normal in structure. Pulmonic valve regurgitation is not visualized. No evidence of pulmonic stenosis. Aorta: The aortic root is normal in size and structure. Venous: The inferior vena cava is normal in size with greater than 50% respiratory variability, suggesting right atrial pressure of 3 mmHg. IAS/Shunts: No atrial level shunt detected by color flow Doppler.  LEFT VENTRICLE PLAX 2D LVIDd:         4.70 cm  Diastology LVIDs:         3.40 cm  LV e' medial:    8.67 cm/s LV PW:         1.00 cm  LV E/e' medial:  8.7 LV IVS:        0.80 cm  LV e' lateral:   11.27 cm/s LVOT diam:     1.95 cm  LV E/e' lateral: 6.7 LV SV:         59 LV SV Index:   30 LVOT Area:     2.99 cm  RIGHT VENTRICLE RV Basal diam:  1.80 cm RV S prime:     10.80 cm/s TAPSE (M-mode): 2.4 cm LEFT ATRIUM  Index       RIGHT ATRIUM           Index LA diam:        3.10 cm 1.61 cm/m  RA Area:     13.30 cm LA Vol (A2C):   27.4 ml 14.20 ml/m RA Volume:   30.10 ml  15.60 ml/m LA Vol (A4C):   34.9 ml 18.08 ml/m LA Biplane Vol: 31.6 ml 16.37 ml/m  AORTIC VALVE LVOT Vmax:   82.20 cm/s LVOT Vmean:  56.000 cm/s LVOT VTI:    0.197 m  AORTA Ao Root diam: 2.50 cm MITRAL VALVE MV Area (PHT): 2.82 cm    SHUNTS MV Decel Time: 269 msec    Systemic VTI:  0.20 m MV E  velocity: 75.00 cm/s  Systemic Diam: 1.95 cm MV A velocity: 63.40 cm/s MV E/A ratio:  1.18 Mihai Croitoru MD Electronically signed by Sanda Klein MD Signature Date/Time: 10/21/2020/2:51:25 PM    Final    LONG TERM MONITOR (3-14 DAYS)  Result Date: 10/26/2020 Enrollment 09/30/2020-10/17/2020. Minimum heart rate 51 bpm (sinus bradycardia), max heart rate 154 (sinus tachycardia), and average heart rate 84 bpm (normal sinus rhythm). Predominant underlying rhythm was sinus rhythm. Isolated SVEs were rare (<1.0%), SVE Couplets were rare (<1.0%), and no SVE Triplets were present. No Isolated VEs, VE Couplets, or VE Triplets were present. No diary submitted. Impression: 1. No significant arrhythmias detected. 2. Rare ectopy. [place] B[place] T. Audie Box, MD, [place] [address] [place], [place] [address] [phone] 5:45 PM     ELIGIBLE FOR AVAILABLE RESEARCH PROTOCOL: [phone]?  ASSESSMENT: 45 y.o. [place] woman status post left breast upper outer quadrant biopsy 01/23/2020 for a clinical T3 N1, 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  (a) chest CT scan and bone scan 02/07/2020 showed no evidence of metastatic disease  (1) neoadjuvant chemotherapy consisting of doxorubicin and cyclophosphamide in dose dense fashion x4 started 02/12/2020, completed 03/25/2020, followed by paclitaxel and carboplatin weekly x12 started 04/08/2020 and completed on 05/28/2020  (a) Thomas 02/07/2020 shows an ejection fraction in the 55-60% range  (b) Epirubicin substituted for Doxorubicin secondary to allergic rash from Doxorubicin  (2) status post left lumpectomy and sentinel lymph node sampling 07/15/2020 for a ypT1b ypN0 residual invasive ductal carcinoma, grade 3, with negative margins.  (a) a total of 4 left axillary lymph nodes were removed  (b) repeat prognostic panel on the final pathology again triple negative, with an MIB-1 of  50%.  (3) adjuvant radiation completed 10/09/2020  (a) sensitizing capecitabine attempted but not tolerated by the patient  (4) genetics testing 02/07/2020 through the [facility] Common Hereditary Cancers Panel found no deleterious mutations in  APC, ATM, AXIN2, BARD1, BMPR1A, BRCA1, BRCA2, BRIP1, CDH1, CDKN2A (p14ARF), CDKN2A (p16INK4a), CKD4, CHEK2, CTNNA1, DICER1, EPCAM (Deletion/duplication testing only), GREM1 (promoter region deletion/duplication testing only), KIT, MEN1, MLH1, MSH2, MSH3, MSH6, MUTYH, NBN, NF1, NHTL1, PALB2, PDGFRA, PMS2, POLD1, POLE, PTEN, RAD50, RAD51C, RAD51D, RNF43, SDHB, SDHC, SDHD, SMAD4, SMARCA4. STK11, TP53, TSC1, TSC2, and VHL.  The following genes were evaluated for sequence changes only: SDHA and HOXB13 c.251G>A variant only.  (a) VUS in MSH6 called c.2744C>G identified.   (5)  adjuvant pertuzumab to start 10/21/2020, to continue for 1 year   PLAN: Zonya has completed her chemotherapy and will complete her radiation tomorrow.  She no longer is experiencing palpitations but in any case she will wear a monitor for  7 days beginning on 10/10/2020.  Anticipate no significant findings there.  If that is so then we will start pembrolizumab the following Wednesday, 10/21/2020.  Today we discussed the possible benefits as well as the possible toxicities side effects and complications of that agent.  I am also refilling her EMLA cream.  She wanted to know if she was cancer free and we discussed the difference in meeting between being cancer free and being in remission.  She understands the reason why she needs additional therapy.  Total encounter time 25 minutes.Sarajane Jews C. Josanne Boerema, MD 11/02/20 2:05 PM Medical Oncology and Hematology [facility] [place], [place] [address] Tel. [phone]    Fax. [phone]   I, Wilburn Mylar, am acting as scribe for Dr. Virgie Dad. Nakeda Lebron.  I, Lurline Del MD, have reviewed the above  documentation for accuracy and completeness, and I agree with the above.   *Total Encounter Time as defined by the Centers for Medicare and Medicaid Services includes, in addition to the face-to-face time of a patient visit (documented in the note above) non-face-to-face time: obtaining and reviewing outside history, ordering and reviewing medications, tests or procedures, care coordination (communications with other health care professionals or caregivers) and documentation in the medical record.

## 2020-11-05 ENCOUNTER — Telehealth: Payer: Self-pay

## 2020-11-05 NOTE — Telephone Encounter (Signed)
I called the patient today with the help of a Spanish interpreter about her upcoming follow-up appointment in radiation oncology.   Given the state of the COVID-19 pandemic, concerning case numbers in our community, and guidance from [facility], I offered a phone assessment with the patient to determine if coming to the clinic was necessary. She accepted.  I let the patient know that I had spoken with Dr. Isidore Moos, and she wanted them to know the importance of washing their hands for at least 20 seconds at a time, especially after going out in public, and before they eat.  Limit going out in public whenever possible. Do not touch your face, unless your hands are clean, such as when bathing. Get plenty of rest, eat well, and stay hydrated. Patient verbalized understanding and agreement  The patient reports a few on-going symptomatic concerns. She continues to struggle with severe fatigue, joint pain, and vision changes (the latter two she states have been on-going since she was receiving chemotherapy). She continues to have discomfort at her surgical incision under her axilla, but states discomfort has not worsened. She reports she often wakes up 2-3 times during the night, and feels like she is constantly hungry/snacking. Her fatigue can be activity limiting, but she verbalizes that she is in good spirits. She does  report good healing of her skin in the radiation fields. Skin is intact and almost completely back to her baseline coloring/texture. I recommended that she continue skin care by applying oil or lotion with vitamin E to the skin in the radiation fields, BID, for 2 more months. Patient verbalized understanding and agreement.   Continue follow-up with medical oncology - follow-up is scheduled on 11/23/2020 with Dr. Lurline Del.  I explained that yearly mammograms are important for patients with intact breast tissue, and physical exams are important after mastectomy for patients that  cannot undergo mammography.  I encouraged her to call if she had further questions or concerns about her healing. Otherwise, she will follow-up PRN in radiation oncology. Patient is pleased with this plan, and we will cancel her upcoming follow-up to reduce the risk of COVID-19 transmission.   Will make Medical Oncology team aware of on-going issues patient mentioned so they can be addressed at her next F/U appointment

## 2020-11-05 NOTE — Telephone Encounter (Signed)
I will call patient via interpreter line. Monitor/ECHO results were mailed out today 02/24 per our protocol for normal results- I will attempt to switch to spanish next time. Thank you!

## 2020-11-05 NOTE — Telephone Encounter (Signed)
Thank you for the heads-up  GM

## 2020-11-05 NOTE — Telephone Encounter (Signed)
I will have my nurse [name] help with this.  We tried to contact her about this.  [place] T. Audie Box, MD, [facility]  [address], [place], [place] [address] [phone]  3:08 PM

## 2020-11-06 ENCOUNTER — Ambulatory Visit: Payer: Self-pay | Admitting: Radiation Oncology

## 2020-11-10 ENCOUNTER — Telehealth: Payer: Self-pay

## 2020-11-10 NOTE — Telephone Encounter (Signed)
Called patient via interpreter line- discussed ECHO/MONITOR results.  Patient verbalized understanding

## 2020-11-16 NOTE — Progress Notes (Signed)
..  The following Medication: Beryle Flock has been approved thru [employer] as Risk manager. Enrollment period is 11/16/2020 to 11/16/2021.  Assistance ID: [id number]. Reason for Assistance: Self Pay First DOS: 11/23/2020.

## 2020-11-17 ENCOUNTER — Other Ambulatory Visit: Payer: Self-pay | Admitting: Oncology

## 2020-11-17 NOTE — Progress Notes (Signed)
From: Tempie Donning  Sent: 11/17/2020 10:20 AM EST  To: Chauncey Cruel, MD, Rennis Harding, RN, *  Subject: Patient Assistance                Patient was approved for free Keytruda from [employer]. I have spoke with them this morning to confirm medication to ship to be on hand for appt on 11/23/2020. They have confirmed and she is ready for her appt.  Thanks,  Mickel Baas

## 2020-11-22 NOTE — Progress Notes (Signed)
[name]  [phone] [phone]     ID: [name] DOB: 1976-06-01  MR#: [id number]  [id number]#:[id number]  Patient Care Team: Jacelyn Pi, Lilia Argue, MD as PCP - General (Family Medicine) Rolm Bookbinder, MD as Consulting Physician (General Surgery) Chatham Howington, Virgie Dad, MD as Consulting Physician (Oncology) Eppie Gibson, MD as Attending Physician (Radiation Oncology) Rockwell Germany, RN as Oncology Nurse Navigator Mauro Kaufmann, RN as Oncology Nurse Navigator Chauncey Cruel, MD OTHER MD:  CHIEF COMPLAINT: Triple negative breast cancer  CURRENT TREATMENT: pembrolizumab   INTERVAL HISTORY:  [name] returns today for follow up and treatment of her triple negative breast cancer.   Since her last visit, she underwent echocardiogram on 10/21/2020 showing an ejection fraction of 60-65%.  She was scheduled to begin [place] on 11/02/2020, but her insurance denied coverage of the medication. Fortunately, she has been approved for assistance through the manufacturer.    REVIEW OF SYSTEMS: [name] tells me all her joints hurt.  The right forearm particularly is very uncomfortable just below the thumb.  She feels very tired.  Her vision is blurry.  She is not doing any exercise at present.  A detailed review of systems was otherwise stable   COVID 19 VACCINATION STATUS: She had the J&J vaccine x1., infection 06/20/2020, and Pfizer booster on 08/19/2020   HISTORY OF CURRENT ILLNESS: From the original intake note:  [name] herself palpated a left beast lump with associated pain. She underwent bilateral diagnostic mammography with tomography and left breast ultrasonography at [facility] on 01/23/2020 showing: breast density category B; palpable 3.6 cm left breast mass with associated calcifications at 1 o'clock; 0.8 cm left breast mass at 2 o'clock, concerning for abnormal intramammary lymph node; at least two abnormal left axillary  lymph nodes.  Accordingly on 01/23/2020 she proceeded to biopsy of the left breast area in question. The pathology from this procedure ([id number]) showed: invasive ductal carcinoma at 1 o'clock, grade 3. Prognostic indicators significant for: estrogen receptor, 20% positive with moderate staining intensity and progesterone receptor, 0% negative. Proliferation marker Ki67 at 40%. HER2 negative by immunohistochemistry (1+).  The left axillary lymph node confirmed metastatic carcinoma.  The left intramammary lymph node was negative for carcinoma.  The patient's subsequent history is as detailed below.   PAST MEDICAL HISTORY: Past Medical History:  Diagnosis Date   Allergy    Asthma    Breast cancer in female [facility])    Left   Chronic back pain    History of breast cancer    Hx of migraines    Morbid obesity ([place])     PAST SURGICAL HISTORY: Past Surgical History:  Procedure Laterality Date   BREAST LUMPECTOMY WITH RADIOACTIVE SEED AND SENTINEL LYMPH NODE BIOPSY Left 07/15/2020   Procedure: LEFT BREAST LUMPECTOMY WITH RADIOACTIVE SEED AND LEFT AXILLARY SENTINEL LYMPH NODE BIOPSY, LEFT AXILLARY NODE RADIOACTIVE SEED GUIDED EXCISION, LEFT BREAST RADIOACTIVE SEED GUIDED EXCISION IM NODE;  Surgeon: Rolm Bookbinder, MD;  Location: [facility];  Service: General;  Laterality: Left;  PEC BLOCK   CESAREAN SECTION WITH BILATERAL TUBAL LIGATION Bilateral 07/23/2015   Procedure: CESAREAN SECTION WITH BILATERAL TUBAL LIGATION;  Surgeon: Donnamae Jude, MD;  Location: [facility];  Service: Obstetrics;  Laterality: Bilateral;   PORTACATH PLACEMENT Right 02/11/2020   Procedure: INSERTION PORT-A-CATH WITH ULTRASOUND GUIDANCE;  Surgeon: Rolm Bookbinder, MD;  Location: [facility];  Service: General;  Laterality: Right;   TUBAL LIGATION  FAMILY HISTORY: Family History  Problem Relation Age of Onset   Heart disease Maternal Grandmother    Her parents are both living as of 01/2020,  her father age 25 and her mother age 72. She has three maternal half-siblings (1 sister, 2 brothers) and four paternal half-siblings (1 sister, 3 brothers). There is no family history of cancer to her knowledge.   GYNECOLOGIC HISTORY:  Patient's last menstrual period was 01/20/2020 (exact date). Menarche: 45 years old Age at first live birth: 45 years old [place] P 3 LMP 01/2020 Contraceptive s/p tubal ligation HRT n/a  Hysterectomy? no BSO? 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 Husband [name] works for a Financial trader, both in [employer]. They are both from [place]. She lives at home with [name] and their three children-- [name], [name], and [name]. She attends an [employer].    ADVANCED DIRECTIVES: In the absence of any documentation to the contrary, the patient's spouse is their HCPOA.    HEALTH MAINTENANCE: Social History   Tobacco Use   Smoking status: Never Smoker   Smokeless tobacco: Never Used  Vaping Use   Vaping Use: Never used  Substance Use Topics   Alcohol use: No   Drug use: No     Colonoscopy: n/a (age)  PAP: 11/2017, negative (per patient)  Bone density: n/a (age)   Allergies  Allergen Reactions   Doxorubicin Hcl Rash    Experience chest tightness, abdominal pain, and redness around lips during and shortly after doxorubicin bolus.     Current Outpatient Medications  Medication Sig Dispense Refill   lidocaine-prilocaine (EMLA) cream APPLY  CREAM TOPICALLY TO AFFECTED AREA ONCE 30 g 3   venlafaxine XR (EFFEXOR-XR) 37.5 MG 24 hr capsule Take 1 capsule (37.5 mg total) by mouth daily with breakfast. 90 capsule 4   No current facility-administered medications for this visit.    OBJECTIVE: Spanish speaker who appears stated age  45:   11/23/20 0907  BP: 123/81  Pulse: 76  Resp: 17  Temp: 97.6 F (36.4 C)  SpO2: 100%      Body mass index is 37.35 kg/m.   Wt Readings from Last 3 Encounters:  11/23/20 204 lb 3.2 oz (92.6 kg)  10/08/20 204 lb 8 oz (92.8 kg)  09/25/20 205 lb 12.8 oz (93.4 kg)      ECOG FS:1 - Symptomatic but completely ambulatory  Sclerae unicteric, EOMs intact Wearing a mask No cervical or supraclavicular adenopathy Lungs no rales or rhonchi Heart regular rate and rhythm Abd soft, nontender, positive bowel sounds MSK no focal spinal tenderness, no upper extremity lymphedema Neuro: nonfocal, well oriented, appropriate affect Breasts: The right breast is benign per the left breast is status post lumpectomy and radiation.  There is minimal hyperpigmentation.  The cosmetic result is excellent.  There is no evidence of residual recurrent disease.  Both axillae are benign   LAB RESULTS:  CMP     Component Value Date/Time   NA 138 10/08/2020 1204   NA 138 08/15/2017 1525   K 3.6 10/08/2020 1204   CL 106 10/08/2020 1204   CO2 26 10/08/2020 1204   GLUCOSE 137 (H) 10/08/2020 1204   BUN 13 10/08/2020 1204   BUN 12 08/15/2017 1525   CREATININE 0.74 10/08/2020 1204   CREATININE 0.77 02/26/2020 0915   CALCIUM 9.2 10/08/2020 1204   PROT 7.0 10/08/2020 1204   PROT 7.4  01/03/2017 1642   ALBUMIN 3.7 10/08/2020 1204   ALBUMIN 4.3 01/03/2017 1642   AST 22 10/08/2020 1204   AST 17 02/26/2020 0915   ALT 24 10/08/2020 1204   ALT 21 02/26/2020 0915   ALKPHOS 93 10/08/2020 1204   BILITOT 0.3 10/08/2020 1204   BILITOT <0.2 (L) 02/26/2020 0915   GFRNONAA >60 10/08/2020 1204   GFRNONAA >60 02/26/2020 0915   GFRAA >60 06/03/2020 0949   GFRAA >60 02/26/2020 0915    No results found for: TOTALPROTELP, ALBUMINELP, A1GS, A2GS, BETS, BETA2SER, GAMS, MSPIKE, SPEI  Lab Results  Component Value Date   WBC 2.9 (L) 11/23/2020   NEUTROABS 1.6 (L) 11/23/2020   HGB 11.5 (L) 11/23/2020   HCT 34.3 (L) 11/23/2020   MCV 82.9 11/23/2020   PLT 196 11/23/2020    No results found for: LABCA2  No  components found for: LPFXTK240  No results for input(s): INR in the last 168 hours.  No results found for: LABCA2  No results found for: XBD532  No results found for: DJM426  No results found for: STM196  No results found for: CA2729  No components found for: HGQUANT  No results found for: CEA1 / No results found for: CEA1   No results found for: AFPTUMOR  No results found for: CHROMOGRNA  No results found for: KPAFRELGTCHN, LAMBDASER, KAPLAMBRATIO (kappa/lambda light chains)  No results found for: HGBA, HGBA2QUANT, HGBFQUANT, HGBSQUAN (Hemoglobinopathy evaluation)   No results found for: LDH  No results found for: IRON, TIBC, IRONPCTSAT (Iron and TIBC)  No results found for: FERRITIN  Urinalysis No results found for: COLORURINE, APPEARANCEUR, LABSPEC, PHURINE, GLUCOSEU, HGBUR, BILIRUBINUR, KETONESUR, PROTEINUR, UROBILINOGEN, NITRITE, LEUKOCYTESUR   STUDIES: LONG TERM MONITOR (3-14 DAYS)  Result Date: 10/26/2020 Enrollment 09/30/2020-10/17/2020. Minimum heart rate 51 bpm (sinus bradycardia), max heart rate 154 (sinus tachycardia), and average heart rate 84 bpm (normal sinus rhythm). Predominant underlying rhythm was sinus rhythm. Isolated SVEs were rare (<1.0%), SVE Couplets were rare (<1.0%), and no SVE Triplets were present. No Isolated VEs, VE Couplets, or VE Triplets were present. No diary submitted. Impression: 1. No significant arrhythmias detected. 2. Rare ectopy. Gerri Spore T. Flora Lipps, MD, [facility]   [facility] [address] [place], [place] [address] [phone] 5:45 PM     ELIGIBLE FOR AVAILABLE RESEARCH PROTOCOL: [phone]?  ASSESSMENT: 46 y.o. [place] woman status post left breast upper outer quadrant biopsy 01/23/2020 for a clinical T3 N1, 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  (a) chest CT scan and bone scan 02/07/2020 showed no evidence of metastatic disease  (1) neoadjuvant  chemotherapy consisting of doxorubicin and cyclophosphamide in dose dense fashion x4 started 02/12/2020, completed 03/25/2020, followed by paclitaxel and carboplatin weekly x12 started 04/08/2020 and completed on 05/28/2020  (a) echo 02/07/2020 shows an ejection fraction in the 55-60% range  (b) Epirubicin substituted for Doxorubicin secondary to allergic rash from Doxorubicin  (2) status post left lumpectomy and sentinel lymph node sampling 07/15/2020 for a ypT1b ypN0 residual invasive ductal carcinoma, grade 3, with negative margins.  (a) a total of 4 left axillary lymph nodes were removed  (b) repeat prognostic panel on the final pathology again triple negative, with an MIB-1 of 50%.  (3) adjuvant radiation completed 10/09/2020  (a) sensitizing capecitabine attempted but not tolerated by the patient  (4) genetics testing 02/07/2020 through the [facility] Common Hereditary Cancers Panel found no deleterious mutations in  APC, ATM, AXIN2,  BARD1, BMPR1A, BRCA1, BRCA2, BRIP1, CDH1, CDKN2A (p14ARF), CDKN2A (p16INK4a), CKD4, CHEK2, CTNNA1, DICER1, EPCAM (Deletion/duplication testing only), GREM1 (promoter region deletion/duplication testing only), KIT, MEN1, MLH1, MSH2, MSH3, MSH6, MUTYH, NBN, NF1, NHTL1, PALB2, PDGFRA, PMS2, POLD1, POLE, PTEN, RAD50, RAD51C, RAD51D, RNF43, SDHB, SDHC, SDHD, SMAD4, SMARCA4. STK11, TP53, TSC1, TSC2, and VHL.  The following genes were evaluated for sequence changes only: SDHA and HOXB13 c.251G>A variant only.  (a) VUS in MSH6 called c.2744C>G identified.   (5)  adjuvant pertuzumab to start 10/21/2020, to continue for 1 year   PLAN: [name] will start pembrolizumab today.  She understands this is not chemotherapy but a form of immunotherapy.  We discussed the possible side effects toxicities and complications which include diarrhea, rash, hypothyroidism, dry cough, and really almost anything depending on how her body processes to this drug.  She will need to be alert to any  symptoms and let me know when she returns 12/14/2020 how she tolerated the first dose.  Assuming she tolerates this well we would do it for 1year.  Otherwise we will simply discontinue and observe off treatment  She really needs to start an exercise program.  We discussed this at length today.  I also suggested she get a tai chi video out of YouTube and do this on a daily basis.  That is what will help her body aches the most.    The eye blurriness is due to accommodation issues and that will clear with time.  However she can also be developing cataracts from her treatment and she will benefit from ophthalmologic examination  She will see me again in 3 weeks with her second cycle.  She knows to call for any other issues that may develop before then.  Total encounter time 25 minutes.Sarajane Jews C. Cathy Crounse, MD 11/23/20 9:17 AM Medical Oncology and Hematology [facility] [place], [place] [address] Tel. [phone]    Fax. [phone]   I, Wilburn Mylar, am acting as scribe for Dr. Virgie Dad. Jalil Lorusso.  I, Lurline Del MD, have reviewed the above documentation for accuracy and completeness, and I agree with the above.   *Total Encounter Time as defined by the Centers for Medicare and Medicaid Services includes, in addition to the face-to-face time of a patient visit (documented in the note above) non-face-to-face time: obtaining and reviewing outside history, ordering and reviewing medications, tests or procedures, care coordination (communications with other health care professionals or caregivers) and documentation in the medical record.

## 2020-11-23 ENCOUNTER — Other Ambulatory Visit: Payer: Self-pay

## 2020-11-23 ENCOUNTER — Inpatient Hospital Stay: Payer: No Typology Code available for payment source

## 2020-11-23 ENCOUNTER — Inpatient Hospital Stay (HOSPITAL_BASED_OUTPATIENT_CLINIC_OR_DEPARTMENT_OTHER): Payer: Self-pay | Admitting: Oncology

## 2020-11-23 ENCOUNTER — Inpatient Hospital Stay: Payer: No Typology Code available for payment source | Attending: Oncology

## 2020-11-23 VITALS — BP 123/81 | HR 76 | Temp 97.6°F | Resp 17 | Ht 62.0 in | Wt 204.2 lb

## 2020-11-23 DIAGNOSIS — C773 Secondary and unspecified malignant neoplasm of axilla and upper limb lymph nodes: Secondary | ICD-10-CM | POA: Insufficient documentation

## 2020-11-23 DIAGNOSIS — Z923 Personal history of irradiation: Secondary | ICD-10-CM | POA: Insufficient documentation

## 2020-11-23 DIAGNOSIS — Z95828 Presence of other vascular implants and grafts: Secondary | ICD-10-CM

## 2020-11-23 DIAGNOSIS — Z170 Estrogen receptor positive status [ER+]: Secondary | ICD-10-CM

## 2020-11-23 DIAGNOSIS — Z5112 Encounter for antineoplastic immunotherapy: Secondary | ICD-10-CM | POA: Insufficient documentation

## 2020-11-23 DIAGNOSIS — C50412 Malignant neoplasm of upper-outer quadrant of left female breast: Secondary | ICD-10-CM

## 2020-11-23 DIAGNOSIS — M2550 Pain in unspecified joint: Secondary | ICD-10-CM | POA: Insufficient documentation

## 2020-11-23 DIAGNOSIS — H539 Unspecified visual disturbance: Secondary | ICD-10-CM | POA: Insufficient documentation

## 2020-11-23 DIAGNOSIS — Z79899 Other long term (current) drug therapy: Secondary | ICD-10-CM | POA: Insufficient documentation

## 2020-11-23 DIAGNOSIS — Z8616 Personal history of COVID-19: Secondary | ICD-10-CM | POA: Insufficient documentation

## 2020-11-23 DIAGNOSIS — Z171 Estrogen receptor negative status [ER-]: Secondary | ICD-10-CM | POA: Insufficient documentation

## 2020-11-23 LAB — CBC WITH DIFFERENTIAL/PLATELET
Abs Immature Granulocytes: 0.02 10*3/uL (ref 0.00–0.07)
Basophils Absolute: 0 10*3/uL (ref 0.0–0.1)
Basophils Relative: 1 %
Eosinophils Absolute: 0.1 10*3/uL (ref 0.0–0.5)
Eosinophils Relative: 3 %
HCT: 34.3 % — ABNORMAL LOW (ref 36.0–46.0)
Hemoglobin: 11.5 g/dL — ABNORMAL LOW (ref 12.0–15.0)
Immature Granulocytes: 1 %
Lymphocytes Relative: 29 %
Lymphs Abs: 0.9 10*3/uL (ref 0.7–4.0)
MCH: 27.8 pg (ref 26.0–34.0)
MCHC: 33.5 g/dL (ref 30.0–36.0)
MCV: 82.9 fL (ref 80.0–100.0)
Monocytes Absolute: 0.3 10*3/uL (ref 0.1–1.0)
Monocytes Relative: 10 %
Neutro Abs: 1.6 10*3/uL — ABNORMAL LOW (ref 1.7–7.7)
Neutrophils Relative %: 56 %
Platelets: 196 10*3/uL (ref 150–400)
RBC: 4.14 MIL/uL (ref 3.87–5.11)
RDW: 14.1 % (ref 11.5–15.5)
WBC: 2.9 10*3/uL — ABNORMAL LOW (ref 4.0–10.5)
nRBC: 0 % (ref 0.0–0.2)

## 2020-11-23 LAB — COMPREHENSIVE METABOLIC PANEL
ALT: 26 U/L (ref 0–44)
AST: 21 U/L (ref 15–41)
Albumin: 3.5 g/dL (ref 3.5–5.0)
Alkaline Phosphatase: 91 U/L (ref 38–126)
Anion gap: 6 (ref 5–15)
BUN: 12 mg/dL (ref 6–20)
CO2: 25 mmol/L (ref 22–32)
Calcium: 9.1 mg/dL (ref 8.9–10.3)
Chloride: 108 mmol/L (ref 98–111)
Creatinine, Ser: 0.77 mg/dL (ref 0.44–1.00)
GFR, Estimated: >60 mL/min (ref >60)
Glucose, Bld: 109 mg/dL — ABNORMAL HIGH (ref 70–99)
Potassium: 3.7 mmol/L (ref 3.5–5.1)
Sodium: 139 mmol/L (ref 135–145)
Total Bilirubin: 0.3 mg/dL (ref 0.3–1.2)
Total Protein: 6.6 g/dL (ref 6.5–8.1)

## 2020-11-23 MED ORDER — HEPARIN SOD (PORK) LOCK FLUSH 100 UNIT/ML IV SOLN
500.0000 [IU] | Freq: Once | INTRAVENOUS | Status: AC | PRN
  Administered 2020-11-23: 500 [IU]
  Filled 2020-11-23: qty 5

## 2020-11-23 MED ORDER — SODIUM CHLORIDE 0.9 % IV SOLN
Freq: Once | INTRAVENOUS | Status: AC
  Filled 2020-11-23: qty 250

## 2020-11-23 MED ORDER — SODIUM CHLORIDE 0.9 % IV SOLN
200.0000 mg | Freq: Once | INTRAVENOUS | Status: AC
  Administered 2020-11-23: 200 mg via INTRAVENOUS
  Filled 2020-11-23: qty 8

## 2020-11-23 MED ORDER — SODIUM CHLORIDE 0.9% FLUSH
10.0000 mL | INTRAVENOUS | Status: DC | PRN
  Administered 2020-11-23: 10 mL
  Filled 2020-11-23: qty 10

## 2020-11-23 MED ORDER — SODIUM CHLORIDE 0.9% FLUSH
10.0000 mL | Freq: Once | INTRAVENOUS | Status: AC
  Administered 2020-11-23: 10 mL
  Filled 2020-11-23: qty 10

## 2020-11-23 NOTE — Patient Instructions (Signed)
[facility] Discharge Instructions for Patients Receiving Chemotherapy  Today you received the following chemotherapy agents: Pembrolizumab (Keytruda)  To help prevent nausea and vomiting after your treatment, we encourage you to take your nausea medication  as prescribed.    If you develop nausea and vomiting that is not controlled by your nausea medication, call the clinic.   BELOW ARE SYMPTOMS THAT SHOULD BE REPORTED IMMEDIATELY:  *FEVER GREATER THAN 100.5 F  *CHILLS WITH OR WITHOUT FEVER  NAUSEA AND VOMITING THAT IS NOT CONTROLLED WITH YOUR NAUSEA MEDICATION  *UNUSUAL SHORTNESS OF BREATH  *UNUSUAL BRUISING OR BLEEDING  TENDERNESS IN MOUTH AND THROAT WITH OR WITHOUT PRESENCE OF ULCERS  *URINARY PROBLEMS  *BOWEL PROBLEMS  UNUSUAL RASH Items with * indicate a potential emergency and should be followed up as soon as possible.  Feel free to call the clinic should you have any questions or concerns. The clinic phone number is [phone].  Please show the CHEMO ALERT CARD at check-in to the Emergency Department and triage nurse.  Pembrolizumab injection Qu es este medicamento? El PEMBROLIZUMAB es un anticuerpo monoclonal. Se usa para tratar ciertos tipos de cncer. Este medicamento puede ser utilizado para otros usos; si tiene alguna pregunta consulte con su proveedor de atencin mdica o con su farmacutico. MARCAS COMUNES: Keytruda Qu le debo informar a mi profesional de la salud antes de tomar este medicamento? Necesitan saber si usted presenta alguno de los siguientes problemas o situaciones: enfermedades autoinmunes tales como enfermedad de Crohn, colitis ulcerativa o lupus ha tenido o planea tener un trasplante alognico de clulas madre (usa las clulas madre de otra persona) antecedentes de trasplante de rganos antecedentes de radiacin en el pecho problemas del sistema nervioso, tales como miastenia grave o sndrome de Guillain-Barr una reaccin  alrgica o inusual al pembrolizumab, a otros medicamentos, alimentos, colorantes o conservantes si est embarazada o buscando quedar embarazada si est amamantando a un beb Cmo debo utilizar este medicamento? Este medicamento se administra mediante infusin en una vena. Lo administra un profesional de la salud en un hospital o en un entorno clnico. Se le entregar una Gua del medicamento (MedGuide, su nombre en ingls) especial antes de cada tratamiento. Asegrese de leer esta informacin cada vez cuidadosamente. Hable con su pediatra para informarse acerca del uso de este medicamento en nios. Aunque este medicamento se puede recetar a nios tan pequeos como de 6 meses de edad con ciertas afecciones, existen precauciones que deben tomarse. Sobredosis: Pngase en contacto inmediatamente con un centro toxicolgico o una sala de urgencia si usted cree que haya tomado demasiado medicamento. ATENCIN: Este medicamento es solo para usted. No comparta este medicamento con nadie. Qu sucede si me olvido de una dosis? Es importante no olvidar ninguna dosis. Informe a su mdico o a su profesional de la salud si no puede asistir a una cita. Qu puede interactuar con este medicamento? No se han estudiado las interacciones. Puede ser que esta lista no menciona todas las posibles interacciones. Informe a su profesional de la salud de todos los productos a base de hierbas, medicamentos de venta libre o suplementos nutritivos que est tomando. Si usted fuma, consume bebidas alcohlicas o si utiliza drogas ilegales, indqueselo tambin a su profesional de la salud. Algunas sustancias pueden interactuar con su medicamento. A qu debo estar atento al usar este medicamento? Se supervisar su estado de salud atentamente mientras reciba este medicamento. Usted podra necesitar realizarse anlisis de sangre mientras est usando este medicamento. No debe   quedar embarazada mientras est usando este medicamento o por  4 meses despus de dejar de usarlo. Las mujeres deben informar a su mdico si estn buscando quedar embarazadas o si creen que podran estar embarazadas. Existe la posibilidad de efectos secundarios graves en un beb sin nacer. Para obtener ms informacin, hable con su profesional de la salud o su farmacutico. No debe amamantar a un beb mientras est usando este medicamento o durante 4 meses despus de la ltima dosis. Qu efectos secundarios puedo tener al utilizar este medicamento? Efectos secundarios que debe informar a su mdico o a su profesional de la salud tan pronto como sea posible: reacciones alrgicas, tales como erupcin cutnea, comezn/picazn o urticaria, e hinchazn de la cara, los labios o la lengua con sangre o de color negro y aspecto alquitranado problemas para respirar cambios en la visin dolor en el pecho escalofros confusin estreimiento tos diarrea mareo, sensacin de desmayo o aturdimiento frecuencia cardiaca rpida o irregular fiebre enrojecimiento dolor en las articulaciones recuentos sanguneos bajos: este medicamento podra reducir la cantidad de glbulos blancos, glbulos rojos y plaquetas. Su riesgo de infeccin y sangrado podra ser mayor. dolor muscular debilidad muscular dolor, hormigueo o entumecimiento de las manos o los pies dolor de cabeza persistente enrojecimiento, formacin de ampollas, descamacin o distensin de la piel, incluso dentro de la boca signos y sntomas de niveles elevados de azcar en la sangre, tales como mareo; boca seca; piel seca; aliento frutal; nuseas; dolor de estmago; aumento del apetito o la sed; aumento de la frecuencia urinaria signos y sntomas de lesin renal, tales como dificultad para orinar o cambios en la cantidad de orina signos y sntomas de lesin en el hgado, como orina oscura, heces claras, prdida del apetito, nuseas, dolor en la regin abdominal superior derecha, color amarillento de los ojos o la piel sudoracin ganglios  linfticos inflamados prdida de peso Efectos secundarios que generalmente no requieren atencin mdica (infrmelos a su mdico o a su profesional de la salud si persisten o si son molestos): disminucin del apetito cada del cabello cansancio Puede ser que esta lista no menciona todos los posibles efectos secundarios. Comunquese a su mdico por asesoramiento mdico sobre los efectos secundarios. Usted puede informar los efectos secundarios a la FDA por telfono al 1-800-FDA-1088. Dnde debo guardar mi medicina? Este medicamento se administra en hospitales o clnicas, y no necesitar guardarlo en su domicilio. ATENCIN: Este folleto es un resumen. Puede ser que no cubra toda la posible informacin. Si usted tiene preguntas acerca de esta medicina, consulte con su mdico, su farmacutico o su profesional de la salud.  2021 Elsevier/Gold Standard (2020-01-02 00:00:00)   

## 2020-11-25 ENCOUNTER — Telehealth: Payer: Self-pay | Admitting: *Deleted

## 2020-11-25 NOTE — Telephone Encounter (Signed)
-----   Message from Jolaine Click, RN sent at 11/23/2020  9:50 AM EDT ----- Regarding: First Time Keytruda - Dr. Jana Hakim Patient First Time Keytruda - Dr. Jana Hakim Patient Preferred language - Spanish

## 2020-11-25 NOTE — Telephone Encounter (Signed)
Tried to reach pt by phone with [name] to see how she did with her first [place].  Mail box full & unable to leave a message.

## 2020-11-27 NOTE — Progress Notes (Signed)
  Patient Name: [name] MRN: [id number] DOB: 12-16-1975 Referring Physician: Lurline Del (Profile Not Attached) Date of Service: 10/09/2020 [facility], [place]                                                        End Of Treatment Note  Diagnoses: C50.412-Malignant neoplasm of upper-outer quadrant of left female breast  Cancer Staging: Cancer Staging Malignant neoplasm of upper-outer quadrant of left breast in female, estrogen receptor positive ([place]) Staging form: Breast, AJCC 8th Edition - Clinical stage from 01/29/2020: Stage IIIA (cT2, cN1, cM0, G3, ER+, PR-, HER2-) - Unsigned Stage prefix: Initial diagnosis Histologic grading system: 3 grade system  ypT1b, ypN0  Intent: Curative  Radiation Treatment Dates: 08/26/2020 through 10/09/2020 Site Technique Total Dose (Gy) Dose per Fx (Gy) Completed Fx Beam Energies  Breast, Left: Breast_Lt 3D 50/50 2 25/25 15X  Breast, Left: Breast_Lt_PAB_SCV 3D 50/50 2 25/25 10X, 15X  Breast, Left: Breast_Lt_Bst 3D 10/10 2 5/5 6X, 10X   Narrative: The patient tolerated radiation therapy relatively well.   Plan: The patient will follow-up with radiation oncology in 73mo, or as needed. -----------------------------------  Eppie Gibson, MD

## 2020-12-11 ENCOUNTER — Other Ambulatory Visit (HOSPITAL_COMMUNITY)
Admission: RE | Admit: 2020-12-11 | Discharge: 2020-12-11 | Disposition: A | Discharge status: Home / Self Care | Payer: No Typology Code available for payment source | Source: Ambulatory Visit | Attending: Obstetrics and Gynecology | Admitting: Obstetrics and Gynecology

## 2020-12-11 ENCOUNTER — Ambulatory Visit (INDEPENDENT_AMBULATORY_CARE_PROVIDER_SITE_OTHER): Payer: Self-pay | Admitting: Obstetrics and Gynecology

## 2020-12-11 ENCOUNTER — Other Ambulatory Visit: Payer: Self-pay

## 2020-12-11 ENCOUNTER — Encounter: Payer: Self-pay | Admitting: Obstetrics and Gynecology

## 2020-12-11 VITALS — BP 115/74 | HR 86 | Ht 62.0 in | Wt 201.2 lb

## 2020-12-11 DIAGNOSIS — Z789 Other specified health status: Secondary | ICD-10-CM

## 2020-12-11 DIAGNOSIS — Z01419 Encounter for gynecological examination (general) (routine) without abnormal findings: Secondary | ICD-10-CM | POA: Insufficient documentation

## 2020-12-11 NOTE — Progress Notes (Signed)
Obstetrics and Gynecology Annual Patient Evaluation  Appointment Date: 12/11/2020  OBGYN Clinic: [facility]  Primary Care Provider: Jacelyn Pi, Lilia Argue  Referring Provider: Jacelyn Pi, Lilia Argue, *  Chief Complaint:  Chief Complaint  Patient presents with  . Gynecologic Exam    History of Present Illness: [name] is a 45 y.o. Hispanic [phone] (Patient's last menstrual period was 01/20/2019.), seen for the above chief complaint. Her past medical history is significant for breast cancer s/p chemo, RT and surgery, h/o c-section and BTL   Review of Systems: A comprehensive review of systems was negative.   Patient Active Problem List   Diagnosis Date Noted  . Morbid obesity ([place])   . History of breast cancer   . Port-A-Cath in place 03/25/2020  . Genetic testing 02/13/2020  . Malignant neoplasm of upper-outer quadrant of left breast in female, estrogen receptor positive ([place]) 01/27/2020  . Pregnancy 07/23/2015  . Impaired ability to use community resources due to language barrier 07/13/2013     Past Medical History:  Past Medical History:  Diagnosis Date  . Allergy   . Asthma   . Breast cancer in female [facility])    Left  . Breast disorder    breast cancer May 2021  . Chronic back pain   . History of breast cancer   . Hx of migraines   . Morbid obesity ([place])     Past Surgical History:  Past Surgical History:  Procedure Laterality Date  . BREAST LUMPECTOMY WITH RADIOACTIVE SEED AND SENTINEL LYMPH NODE BIOPSY Left 07/15/2020   Procedure: LEFT BREAST LUMPECTOMY WITH RADIOACTIVE SEED AND LEFT AXILLARY SENTINEL LYMPH NODE BIOPSY, LEFT AXILLARY NODE RADIOACTIVE SEED GUIDED EXCISION, LEFT BREAST RADIOACTIVE SEED GUIDED EXCISION IM NODE;  Surgeon: Rolm Bookbinder, MD;  Location: [place];  Service: General;  Laterality: Left;  PEC BLOCK  . CESAREAN SECTION WITH BILATERAL TUBAL LIGATION Bilateral 07/23/2015   Procedure: CESAREAN  SECTION WITH BILATERAL TUBAL LIGATION;  Surgeon: Donnamae Jude, MD;  Location: [place] [facility];  Service: Obstetrics;  Laterality: Bilateral;  . PORTACATH PLACEMENT Right 02/11/2020   Procedure: INSERTION PORT-A-CATH WITH ULTRASOUND GUIDANCE;  Surgeon: Rolm Bookbinder, MD;  Location: [place];  Service: General;  Laterality: Right;  . TUBAL LIGATION      Past Obstetrical History:  OB History  Gravida Para Term Preterm AB Living  4 3 3  0 1 3  SAB IAB Ectopic Multiple Live Births  1 0 0 0 3    # Outcome Date GA Lbr Len/2nd Weight Sex Delivery Anes PTL Lv  4 Term 07/23/15 [redacted]w[redacted]d  5 lb 15.2 oz (2.7 kg) F CS-LTranv Spinal  LIV  3 SAB           2 Term      CS-LTranv   LIV  1 Term      CS-LTranv   LIV    Past Gynecological History: As per HPI. Periods: LMP 01/2019. Pt started chemo 02/2020 History of Pap Smear(s): Yes.   Date: unknown History of HRT use: No.   Social History:  Social History   Socioeconomic History  . Marital status: Legally Separated    Spouse name: Not on file  . Number of children: 3  . Years of education: Not on file  . Highest education level: 9th grade  Occupational History  . Occupation: Architect  Tobacco Use  . Smoking status: Never Smoker  . Smokeless tobacco: Never Used  Vaping  Use  . Vaping Use: Never used  Substance and Sexual Activity  . Alcohol use: No  . Drug use: No  . Sexual activity: Yes    Birth control/protection: Surgical  Other Topics Concern  . Not on file  Social History Narrative  . Not on file   Social Determinants of Health   Financial Resource Strain: Not on file  Food Insecurity: Not on file  Transportation Needs: No Transportation Needs  . Lack of Transportation (Medical): No  . Lack of Transportation (Non-Medical): No  Physical Activity: Not on file  Stress: Not on file  Social Connections: Not on file  Intimate Partner Violence: Not on file    Family History:  Family History  Problem Relation Age of  Onset  . Heart disease Maternal Grandmother     Medications [name] had no medications administered during this visit. Current Outpatient Medications  Medication Sig Dispense Refill  . lidocaine-prilocaine (EMLA) cream APPLY  CREAM TOPICALLY TO AFFECTED AREA ONCE (Patient taking differently: APPLY  CREAM TOPICALLY TO AFFECTED AREA ONCE) 30 g 3  . venlafaxine XR (EFFEXOR-XR) 37.5 MG 24 hr capsule Take 1 capsule (37.5 mg total) by mouth daily with breakfast. (Patient not taking: Reported on 12/11/2020) 90 capsule 4   No current facility-administered medications for this visit.    Allergies Doxorubicin hcl   Physical Exam:  BP 115/74   Pulse 86   Ht 5\' 2"  (1.575 m)   Wt 201 lb 3.2 oz (91.3 kg)   LMP 01/20/2019   BMI 36.80 kg/m  Body mass index is 36.8 kg/m. General appearance: Well nourished, well developed female in no acute distress.  Cardiovascular: normal s1 and s2.  No murmurs, rubs or gallops. Respiratory:  Clear to auscultation bilateral. Normal respiratory effort Abdomen: positive bowel sounds and no masses, hernias; diffusely non tender to palpation, non distended Neuro/Psych:  Normal mood and affect.  Skin:  Warm and dry.  Lymphatic:  No inguinal lymphadenopathy.   Pelvic exam: is limited by body habitus EGBUS: within normal limits Vagina: within normal limits and with no blood or discharge in the vault Cervix: normal appearing cervix without tenderness, discharge or lesions. Uterus:  nonenlarged and non tender Adnexa:  normal adnexa and no mass, fullness, tenderness Rectovaginal: deferred  Laboratory: none  Radiology: none  Assessment: pt doing well  Plan:  1. Well woman exam with routine gynecological exam Routine care. I told her that if she ever has any bleeding or spotting in the future to let us know as this is not normal for someone who is post menopausal. Patient amenable to STD swab screening. - Cytology - PAP( [place]) -  Cervicovaginal ancillary only( [place])  Interpreter used  RTC PRN  Durene Romans MD Attending Center for [employer])

## 2020-12-11 NOTE — Patient Instructions (Signed)
cy

## 2020-12-13 LAB — CERVICOVAGINAL ANCILLARY ONLY
Bacterial Vaginitis (gardnerella): NEGATIVE
Candida Glabrata: NEGATIVE
Candida Vaginitis: NEGATIVE
Chlamydia: NEGATIVE
Comment: NEGATIVE
Comment: NEGATIVE
Comment: NEGATIVE
Comment: NEGATIVE
Comment: NEGATIVE
Comment: NORMAL
Neisseria Gonorrhea: NEGATIVE
Trichomonas: NEGATIVE

## 2020-12-13 NOTE — Progress Notes (Addendum)
[facility]  [phone] [phone]     ID: [name] DOB: 28-Sep-1975  MR#: [id number]  JYN#:[id number]  Patient Care Team: Lezlie Lye, Meda Coffee, MD as PCP - General (Family Medicine) Emelia Loron, MD as Consulting Physician (General Surgery) Kenadi Miltner, Valentino Hue, MD as Consulting Physician (Oncology) Lonie Peak, MD as Attending Physician (Radiation Oncology) Donnelly Angelica, RN as Oncology Nurse Navigator Pershing Proud, RN as Oncology Nurse Navigator Lowella Dell, MD OTHER MD:  CHIEF COMPLAINT: Triple negative breast cancer  CURRENT TREATMENT: pembrolizumab   INTERVAL HISTORY:  [name] returns today for follow up and treatment of her triple negative breast cancer.   She began pembrolizumab at her last visit on 11/23/2020. Today is day 1 cycle 2.  Her most recent echocardiogram on 10/21/2020 showed an ejection fraction of 60-65%.  Of note, she underwent routine pap smear on 12/11/2020. The results are pending.  We are following her TSH:  Results for [facility] DORIA, FERN (MRN [id number]) as of 12/14/2020 08:34  Ref. Range 07/13/2013 12:49 09/25/2020 16:24  TSH Latest Ref Range: 0.450 - 4.500 uIU/mL 2.493 2.500    REVIEW OF SYSTEMS: Aaliah tells me she has a bit of a headache, which is worse at night.  She has a cough as well.  She says her cough has been improving but now it is a little bit worse and it keeps her from sleeping.  She said a little bit of noise in her neck sometimes when she breathes she says.  She also has aches, like she has been exercising.  She says she has been doing a little walking but really no other exercise.  She has had no diarrhea no fever and no rash.  She has had very minimal nausea.  Her appetite is down a bit and things do not taste normal.  A detailed review of systems was otherwise stable.   COVID 19 VACCINATION STATUS: She had the J&J vaccine x1., infection 06/20/2020, and Pfizer booster on  08/19/2020   HISTORY OF CURRENT ILLNESS: From the original intake note:  [name] herself palpated a left beast lump with associated pain. She underwent bilateral diagnostic mammography with tomography and left breast ultrasonography at [facility] on 01/23/2020 showing: breast density category B; palpable 3.6 cm left breast mass with associated calcifications at 1 o'clock; 0.8 cm left breast mass at 2 o'clock, concerning for abnormal intramammary lymph node; at least two abnormal left axillary lymph nodes.  Accordingly on 01/23/2020 she proceeded to biopsy of the left breast area in question. The pathology from this procedure ([id number]) showed: invasive ductal carcinoma at 1 o'clock, grade 3. Prognostic indicators significant for: estrogen receptor, 20% positive with moderate staining intensity and progesterone receptor, 0% negative. Proliferation marker Ki67 at 40%. HER2 negative by immunohistochemistry (1+).  The left axillary lymph node confirmed metastatic carcinoma.  The left intramammary lymph node was negative for carcinoma.  The patient's subsequent history is as detailed below.   PAST MEDICAL HISTORY: Past Medical History:  Diagnosis Date  . Allergy   . Asthma   . Breast cancer in female [facility])    Left  . Breast disorder    breast cancer May 2021  . Chronic back pain   . History of breast cancer   . Hx of migraines   . Morbid obesity ([facility])     PAST SURGICAL HISTORY: Past Surgical History:  Procedure Laterality Date  . BREAST LUMPECTOMY WITH RADIOACTIVE SEED  AND SENTINEL LYMPH NODE BIOPSY Left 07/15/2020   Procedure: LEFT BREAST LUMPECTOMY WITH RADIOACTIVE SEED AND LEFT AXILLARY SENTINEL LYMPH NODE BIOPSY, LEFT AXILLARY NODE RADIOACTIVE SEED GUIDED EXCISION, LEFT BREAST RADIOACTIVE SEED GUIDED EXCISION IM NODE;  Surgeon: Rolm Bookbinder, MD;  Location: [place];  Service: General;  Laterality: Left;  PEC BLOCK  . CESAREAN SECTION WITH BILATERAL TUBAL LIGATION  Bilateral 07/23/2015   Procedure: CESAREAN SECTION WITH BILATERAL TUBAL LIGATION;  Surgeon: Donnamae Jude, MD;  Location: [place] [facility];  Service: Obstetrics;  Laterality: Bilateral;  . PORTACATH PLACEMENT Right 02/11/2020   Procedure: INSERTION PORT-A-CATH WITH ULTRASOUND GUIDANCE;  Surgeon: Rolm Bookbinder, MD;  Location: [place];  Service: General;  Laterality: Right;  . TUBAL LIGATION      FAMILY HISTORY: Family History  Problem Relation Age of Onset  . Heart disease Maternal Grandmother    Her parents are both living as of 01/2020, her father age 55 and her mother age 74. She has three maternal half-siblings (1 sister, 2 brothers) and four paternal half-siblings (1 sister, 3 brothers). There is no family history of cancer to her knowledge.   GYNECOLOGIC HISTORY:  Patient's last menstrual period was 01/20/2020 (exact date). Menarche: 45 years old Age at first live birth: 45 years old [place] P 3 LMP 01/2020 Contraceptive s/p tubal ligation HRT n/a  Hysterectomy? no BSO? 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 Husband [name] works for a Financial trader, both in [employer]. They are both from [place]. She lives at home with Deshler and their three children-- [name], [name], and [name]. She attends an [employer].    ADVANCED DIRECTIVES: In the absence of any documentation to the contrary, the patient's spouse is their HCPOA.    HEALTH MAINTENANCE: Social History   Tobacco Use  . Smoking status: Never Smoker  . Smokeless tobacco: Never Used  Vaping Use  . Vaping Use: Never used  Substance Use Topics  . Alcohol use: No  . Drug use: No     Colonoscopy: n/a (age)  PAP: 12/2020  Bone density: n/a (age)   Allergies  Allergen Reactions  . Doxorubicin Hcl Rash    Experience chest tightness, abdominal pain, and redness around lips during and shortly after  doxorubicin bolus.     Current Outpatient Medications  Medication Sig Dispense Refill  . lidocaine-prilocaine (EMLA) cream APPLY  CREAM TOPICALLY TO AFFECTED AREA ONCE (Patient taking differently: APPLY  CREAM TOPICALLY TO AFFECTED AREA ONCE) 30 g 3  . venlafaxine XR (EFFEXOR-XR) 37.5 MG 24 hr capsule TAKE 1 CAPSULE (37.5 MG TOTAL) BY MOUTH DAILY WITH BREAKFAST. (Patient not taking: Reported on 12/11/2020) 90 capsule 4   No current facility-administered medications for this visit.    OBJECTIVE: Spanish speaker who appears stated age  23:   12/14/20 1241  BP: 122/77  Pulse: 86  Resp: 17  Temp: (!) 96.8 F (36 C)  SpO2: 99%     Body mass index is 36.75 kg/m.   Wt Readings from Last 3 Encounters:  12/14/20 200 lb 14.4 oz (91.1 kg)  12/11/20 201 lb 3.2 oz (91.3 kg)  11/23/20 204 lb 3.2 oz (92.6 kg)      ECOG FS:1 - Symptomatic but completely ambulatory  Sclerae unicteric, EOMs intact Wearing a mask No cervical or supraclavicular adenopathy Lungs no rales or rhonchi Heart regular rate and rhythm Abd soft, nontender, positive bowel sounds  MSK no focal spinal tenderness, no upper extremity lymphedema Neuro: nonfocal, well oriented, appropriate affect Breasts: The right breast is benign.  The left breast is status post lumpectomy and radiation.  The cosmetic result is excellent.  There is still some hyperpigmentation but it is fading.  Both axillae are benign   LAB RESULTS:  CMP     Component Value Date/Time   NA 139 11/23/2020 0855   NA 138 08/15/2017 1525   K 3.7 11/23/2020 0855   CL 108 11/23/2020 0855   CO2 25 11/23/2020 0855   GLUCOSE 109 (H) 11/23/2020 0855   BUN 12 11/23/2020 0855   BUN 12 08/15/2017 1525   CREATININE 0.77 11/23/2020 0855   CREATININE 0.77 02/26/2020 0915   CALCIUM 9.1 11/23/2020 0855   PROT 6.6 11/23/2020 0855   PROT 7.4 01/03/2017 1642   ALBUMIN 3.5 11/23/2020 0855   ALBUMIN 4.3 01/03/2017 1642   AST 21 11/23/2020 0855   AST 17  02/26/2020 0915   ALT 26 11/23/2020 0855   ALT 21 02/26/2020 0915   ALKPHOS 91 11/23/2020 0855   BILITOT 0.3 11/23/2020 0855   BILITOT <0.2 (L) 02/26/2020 0915   GFRNONAA >60 11/23/2020 0855   GFRNONAA >60 02/26/2020 0915   GFRAA >60 06/03/2020 0949   GFRAA >60 02/26/2020 0915    No results found for: Ronnald Ramp, A1GS, A2GS, BETS, BETA2SER, GAMS, MSPIKE, SPEI  Lab Results  Component Value Date   WBC 4.3 12/14/2020   NEUTROABS 2.5 12/14/2020   HGB 12.5 12/14/2020   HCT 38.0 12/14/2020   MCV 83.9 12/14/2020   PLT 215 12/14/2020    No results found for: LABCA2  No components found for: [id number]  No results for input(s): INR in the last 168 hours.  No results found for: LABCA2  No results found for: SWH675  No results found for: FFM384  No results found for: YKZ993  No results found for: CA2729  No components found for: HGQUANT  No results found for: CEA1 / No results found for: CEA1   No results found for: AFPTUMOR  No results found for: CHROMOGRNA  No results found for: KPAFRELGTCHN, LAMBDASER, KAPLAMBRATIO (kappa/lambda light chains)  No results found for: HGBA, HGBA2QUANT, HGBFQUANT, HGBSQUAN (Hemoglobinopathy evaluation)   No results found for: LDH  No results found for: IRON, TIBC, IRONPCTSAT (Iron and TIBC)  No results found for: FERRITIN  Urinalysis No results found for: COLORURINE, APPEARANCEUR, LABSPEC, PHURINE, GLUCOSEU, HGBUR, BILIRUBINUR, KETONESUR, PROTEINUR, UROBILINOGEN, NITRITE, LEUKOCYTESUR   STUDIES: No results found.   ELIGIBLE FOR AVAILABLE RESEARCH PROTOCOL: [phone]?  ASSESSMENT: 45 y.o. 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  (a) chest CT scan and bone scan 02/07/2020 showed no evidence of metastatic disease  (1) neoadjuvant chemotherapy consisting of  doxorubicin and cyclophosphamide in dose dense fashion x4 started 02/12/2020, completed 03/25/2020, followed by paclitaxel and carboplatin weekly x12 started 04/08/2020 and completed on 05/28/2020  (a) echo 02/07/2020 shows an ejection fraction in the 55-60% range  (b) Epirubicin substituted for Doxorubicin secondary to allergic rash from Doxorubicin  (2) status post left lumpectomy and sentinel lymph node sampling 07/15/2020 for a ypT1b ypN0 residual invasive ductal carcinoma, grade 3, with negative margins.  (a) a total of 4 left axillary lymph nodes were removed  (b) repeat prognostic panel on the final pathology again triple negative, with an MIB-1 of  50%.  (3) adjuvant radiation completed 10/09/2020  (a) sensitizing capecitabine attempted but not tolerated by the patient  (4) genetics testing 02/07/2020 through the [facility] Common Hereditary Cancers Panel found no deleterious mutations in  APC, ATM, AXIN2, BARD1, BMPR1A, BRCA1, BRCA2, BRIP1, CDH1, CDKN2A (p14ARF), CDKN2A (p16INK4a), CKD4, CHEK2, CTNNA1, DICER1, EPCAM (Deletion/duplication testing only), GREM1 (promoter region deletion/duplication testing only), KIT, MEN1, MLH1, MSH2, MSH3, MSH6, MUTYH, NBN, NF1, NHTL1, PALB2, PDGFRA, PMS2, POLD1, POLE, PTEN, RAD50, RAD51C, RAD51D, RNF43, SDHB, SDHC, SDHD, SMAD4, SMARCA4. STK11, TP53, TSC1, TSC2, and VHL.  The following genes were evaluated for sequence changes only: SDHA and HOXB13 c.251G>A variant only.  (a) VUS in MSH6 called c.2744C>G identified.   (5)  adjuvant pembrolizumab started 11/23/2020, to continue for 1 year   PLAN: Krissy appears to be tolerating pembrolizumab well.  She does have a cough, but she has a history of pollen allergy and the problem currently is quite overwhelming.  She normally treats this with Claritin but she has not started herself on that medication I think the headache could also be due to sinus and not due to the medication.  I have encouraged her to exercise  more regularly.  She may be starting to get a little bit of rosacea and if she does we will start her on MetroGel.  I reassured her that the hyperpigmentation on the irradiated area will resolve.  She does have some soreness shooting pains and altered feeling in the left axilla area and I reassured her that that is not due to cancer.  I put Claritin in for her at our pharmacy where I believe she will be able to pick it up at no cost.  She will return every 21 days for her pembrolizumab.  I am going to see her in 6 weeks but if she is no better in terms of the cough 3 weeks from today after being on Claritin she will make sure I go back to the treatment area and see her there  Total encounter time 25 minutes.Sarajane Jews C. Euclide Granito, MD 12/14/20 12:57 PM Medical Oncology and Hematology [facility] [place], [place] [address] Tel. [phone]    Fax. [phone]   I, Wilburn Mylar, am acting as scribe for Dr. Virgie Dad. Breon Diss.  I, Lurline Del MD, have reviewed the above documentation for accuracy and completeness, and I agree with the above.   *Total Encounter Time as defined by the Centers for Medicare and Medicaid Services includes, in addition to the face-to-face time of a patient visit (documented in the note above) non-face-to-face time: obtaining and reviewing outside history, ordering and reviewing medications, tests or procedures, care coordination (communications with other health care professionals or caregivers) and documentation in the medical record.

## 2020-12-14 ENCOUNTER — Other Ambulatory Visit (HOSPITAL_COMMUNITY): Payer: Self-pay

## 2020-12-14 ENCOUNTER — Inpatient Hospital Stay: Payer: No Typology Code available for payment source | Attending: Oncology

## 2020-12-14 ENCOUNTER — Other Ambulatory Visit: Payer: Self-pay

## 2020-12-14 ENCOUNTER — Inpatient Hospital Stay: Payer: No Typology Code available for payment source

## 2020-12-14 ENCOUNTER — Inpatient Hospital Stay (HOSPITAL_BASED_OUTPATIENT_CLINIC_OR_DEPARTMENT_OTHER): Payer: Self-pay | Admitting: Oncology

## 2020-12-14 VITALS — BP 122/77 | HR 86 | Temp 96.8°F | Resp 17 | Ht 62.0 in | Wt 200.9 lb

## 2020-12-14 DIAGNOSIS — Z95828 Presence of other vascular implants and grafts: Secondary | ICD-10-CM

## 2020-12-14 DIAGNOSIS — Z853 Personal history of malignant neoplasm of breast: Secondary | ICD-10-CM | POA: Insufficient documentation

## 2020-12-14 DIAGNOSIS — E669 Obesity, unspecified: Secondary | ICD-10-CM | POA: Insufficient documentation

## 2020-12-14 DIAGNOSIS — C50412 Malignant neoplasm of upper-outer quadrant of left female breast: Secondary | ICD-10-CM

## 2020-12-14 DIAGNOSIS — Z171 Estrogen receptor negative status [ER-]: Secondary | ICD-10-CM | POA: Insufficient documentation

## 2020-12-14 DIAGNOSIS — Z170 Estrogen receptor positive status [ER+]: Secondary | ICD-10-CM

## 2020-12-14 DIAGNOSIS — Z923 Personal history of irradiation: Secondary | ICD-10-CM | POA: Insufficient documentation

## 2020-12-14 DIAGNOSIS — J4520 Mild intermittent asthma, uncomplicated: Secondary | ICD-10-CM | POA: Insufficient documentation

## 2020-12-14 DIAGNOSIS — Z5112 Encounter for antineoplastic immunotherapy: Secondary | ICD-10-CM | POA: Insufficient documentation

## 2020-12-14 DIAGNOSIS — C773 Secondary and unspecified malignant neoplasm of axilla and upper limb lymph nodes: Secondary | ICD-10-CM | POA: Insufficient documentation

## 2020-12-14 LAB — COMPREHENSIVE METABOLIC PANEL
ALT: 30 U/L (ref 0–44)
AST: 24 U/L (ref 15–41)
Albumin: 3.9 g/dL (ref 3.5–5.0)
Alkaline Phosphatase: 106 U/L (ref 38–126)
Anion gap: 11 (ref 5–15)
BUN: 11 mg/dL (ref 6–20)
CO2: 23 mmol/L (ref 22–32)
Calcium: 9.1 mg/dL (ref 8.9–10.3)
Chloride: 105 mmol/L (ref 98–111)
Creatinine, Ser: 0.71 mg/dL (ref 0.44–1.00)
GFR, Estimated: >60 mL/min (ref >60)
Glucose, Bld: 99 mg/dL (ref 70–99)
Potassium: 3.8 mmol/L (ref 3.5–5.1)
Sodium: 139 mmol/L (ref 135–145)
Total Bilirubin: 0.3 mg/dL (ref 0.3–1.2)
Total Protein: 7.4 g/dL (ref 6.5–8.1)

## 2020-12-14 LAB — CYTOLOGY - PAP
Comment: NEGATIVE
Diagnosis: NEGATIVE
High risk HPV: NEGATIVE

## 2020-12-14 LAB — CBC WITH DIFFERENTIAL/PLATELET
Abs Immature Granulocytes: 0.02 10*3/uL (ref 0.00–0.07)
Basophils Absolute: 0 10*3/uL (ref 0.0–0.1)
Basophils Relative: 1 %
Eosinophils Absolute: 0.2 10*3/uL (ref 0.0–0.5)
Eosinophils Relative: 5 %
HCT: 38 % (ref 36.0–46.0)
Hemoglobin: 12.5 g/dL (ref 12.0–15.0)
Immature Granulocytes: 1 %
Lymphocytes Relative: 29 %
Lymphs Abs: 1.2 10*3/uL (ref 0.7–4.0)
MCH: 27.6 pg (ref 26.0–34.0)
MCHC: 32.9 g/dL (ref 30.0–36.0)
MCV: 83.9 fL (ref 80.0–100.0)
Monocytes Absolute: 0.4 10*3/uL (ref 0.1–1.0)
Monocytes Relative: 8 %
Neutro Abs: 2.5 10*3/uL (ref 1.7–7.7)
Neutrophils Relative %: 56 %
Platelets: 215 10*3/uL (ref 150–400)
RBC: 4.53 MIL/uL (ref 3.87–5.11)
RDW: 14.3 % (ref 11.5–15.5)
WBC: 4.3 10*3/uL (ref 4.0–10.5)
nRBC: 0 % (ref 0.0–0.2)

## 2020-12-14 LAB — TSH: TSH: 3.924 u[IU]/mL (ref 0.308–3.960)

## 2020-12-14 MED ORDER — SODIUM CHLORIDE 0.9% FLUSH
10.0000 mL | Freq: Once | INTRAVENOUS | Status: AC
  Administered 2020-12-14: 10 mL
  Filled 2020-12-14: qty 10

## 2020-12-14 MED ORDER — SODIUM CHLORIDE 0.9 % IV SOLN
Freq: Once | INTRAVENOUS | Status: AC
  Filled 2020-12-14: qty 250

## 2020-12-14 MED ORDER — HEPARIN SOD (PORK) LOCK FLUSH 100 UNIT/ML IV SOLN
500.0000 [IU] | Freq: Once | INTRAVENOUS | Status: AC | PRN
  Administered 2020-12-14: 500 [IU]
  Filled 2020-12-14: qty 5

## 2020-12-14 MED ORDER — LORATADINE 10 MG PO TABS
10.0000 mg | ORAL_TABLET | Freq: Every day | ORAL | 4 refills | Status: DC
  Filled 2020-12-14: qty 90, 90d supply, fill #0

## 2020-12-14 MED ORDER — SODIUM CHLORIDE 0.9 % IV SOLN
200.0000 mg | Freq: Once | INTRAVENOUS | Status: AC
  Administered 2020-12-14: 200 mg via INTRAVENOUS
  Filled 2020-12-14: qty 8

## 2020-12-14 MED ORDER — SODIUM CHLORIDE 0.9% FLUSH
10.0000 mL | INTRAVENOUS | Status: DC | PRN
  Administered 2020-12-14: 10 mL
  Filled 2020-12-14: qty 10

## 2020-12-14 NOTE — Patient Instructions (Signed)
[facility] Discharge Instructions for Patients Receiving Chemotherapy  Today you received the following chemotherapy agents: Pembrolizumab Beryle Flock)  To help prevent nausea and vomiting after your treatment, we encourage you to take your nausea medication  as prescribed.    If you develop nausea and vomiting that is not controlled by your nausea medication, call the clinic.   BELOW ARE SYMPTOMS THAT SHOULD BE REPORTED IMMEDIATELY:  *FEVER GREATER THAN 100.5 F  *CHILLS WITH OR WITHOUT FEVER  NAUSEA AND VOMITING THAT IS NOT CONTROLLED WITH YOUR NAUSEA MEDICATION  *UNUSUAL SHORTNESS OF BREATH  *UNUSUAL BRUISING OR BLEEDING  TENDERNESS IN MOUTH AND THROAT WITH OR WITHOUT PRESENCE OF ULCERS  *URINARY PROBLEMS  *BOWEL PROBLEMS  UNUSUAL RASH Items with * indicate a potential emergency and should be followed up as soon as possible.  Feel free to call the clinic should you have any questions or concerns. The clinic phone number is [phone].  Please show the [place] at check-in to the Emergency Department and triage nurse.  Pembrolizumab injection Qu es este medicamento? El PEMBROLIZUMAB es un anticuerpo monoclonal. [place] para tratar ciertos tipos de cncer. Este medicamento puede ser utilizado para otros usos; si tiene alguna pregunta consulte con su proveedor de atencin mdica o con su farmacutico. MARCAS COMUNES: Keytruda Qu le debo informar a mi profesional de la salud antes de tomar este medicamento? Necesitan saber si usted presenta alguno de los siguientes problemas o situaciones: enfermedades autoinmunes tales como enfermedad de Crohn, colitis ulcerativa o lupus ha tenido o planea tener un trasplante alognico de Social research officer, government ([place] las clulas madre de Theatre manager persona) antecedentes de trasplante de rganos antecedentes de radiacin en el pecho problemas del sistema nervioso, tales como miastenia grave o sndrome de Curator una reaccin  alrgica o inusual al pembrolizumab, a otros medicamentos, alimentos, colorantes o conservantes si est embarazada o buscando quedar embarazada si est amamantando a un beb Cmo debo utilizar este medicamento? Este medicamento se administra mediante infusin en una vena. Lo administra un profesional de Technical sales engineer en un hospital o en un entorno clnico. Se le entregar una Gua del medicamento (MedGuide, su nombre en ingls) especial antes de cada tratamiento. Asegrese de leer esta informacin cada vez cuidadosamente. Hable con su pediatra para informarse acerca del uso de este medicamento en nios. Aunque este medicamento se puede recetar a nios tan pequeos como de 6 meses de edad con ciertas afecciones, existen precauciones que deben tomarse. Sobredosis: Pngase en contacto inmediatamente con un centro toxicolgico o una sala de urgencia si usted cree que haya tomado demasiado medicamento. ATENCIN: [employer] es solo para usted. No comparta este medicamento con nadie. Qu sucede si me olvido de una dosis? Es importante no olvidar ninguna dosis. Informe a su mdico o a su profesional de la salud si no puede asistir a Photographer. Qu puede interactuar con este medicamento? No se han estudiado las interacciones. Puede ser que esta lista no menciona todas las posibles interacciones. Informe a su profesional de [employer]	[place] de [employer] productos a base de hierbas, medicamentos de [place] o suplementos nutritivos que est tomando. Si usted fuma, consume bebidas alcohlicas o si utiliza drogas ilegales, indqueselo tambin a su profesional de [employer]	[place]. Algunas sustancias pueden interactuar con su medicamento. A qu debo estar atento al usar [employer]? Se supervisar su estado de salud atentamente mientras reciba este medicamento. Usted podra necesitar realizarse [employer] de sangre mientras est usando [place]. No debe  quedar embarazada mientras est usando este medicamento o por  4 meses despus de dejar de usarlo. Las mujeres deben informar a su mdico si estn buscando quedar embarazadas o si creen que podran estar embarazadas. Existe la posibilidad de efectos secundarios graves en un beb sin nacer. Para obtener ms informacin, hable con su profesional de la salud o su farmacutico. No debe amamantar a un beb mientras est usando este medicamento o durante 4 meses despus de la ltima dosis. Qu efectos secundarios puedo tener al [employer] este medicamento? Efectos secundarios que debe informar a su mdico o a Barrister's clerk de la salud tan pronto como sea posible: Chief of Staff, tales como erupcin cutnea, comezn/picazn o urticaria, e hinchazn de la cara, los labios o la lengua con sangre o de color negro y aspecto alquitranado problemas para respirar cambios en la visin dolor en el pecho escalofros confusin estreimiento tos diarrea mareo, sensacin de desmayo o aturdimiento frecuencia cardiaca rpida o irregular fiebre enrojecimiento dolor en las articulaciones recuentos sanguneos bajos: este medicamento podra reducir la cantidad de glbulos blancos, glbulos rojos y plaquetas. Su riesgo de infeccin y sangrado podra ser mayor. dolor muscular debilidad muscular dolor, hormigueo o entumecimiento de las manos o los pies dolor de cabeza persistente enrojecimiento, formacin de ampollas, descamacin o distensin de la piel, incluso dentro de la boca signos y sntomas de niveles elevados de azcar en la sangre, tales como mareo; boca seca; piel seca; aliento frutal; nuseas; dolor de estmago; aumento del apetito o la sed; aumento de la frecuencia urinaria signos y sntomas de lesin renal, tales como dificultad para Garment/textile technologist o cambios en la cantidad de orina signos y sntomas de lesin en el hgado, como orina oscura, heces claras, prdida del apetito, nuseas, dolor en la regin abdominal superior derecha, color amarillento de los ojos o la piel sudoracin ganglios  linfticos inflamados prdida de peso Efectos secundarios que generalmente no requieren atencin mdica (infrmelos a su mdico o a su profesional de la salud si persisten o si son molestos): disminucin del apetito cada del cabello cansancio Puede ser que esta lista no menciona todos los posibles efectos secundarios. Comunquese a su mdico por asesoramiento mdico [employer] Usted puede informar los efectos secundarios a la FDA por telfono al 1-800-FDA-1088. Dnde debo guardar mi medicina? Este medicamento se administra en hospitales o clnicas, y no necesitar guardarlo en su domicilio. ATENCIN: Este folleto es un resumen. Puede ser que no cubra toda la posible informacin. Si usted tiene preguntas acerca de esta medicina, consulte con su mdico, su farmacutico o su profesional de Technical sales engineer.  2021 Elsevier/Gold Standard (2020-01-02 00:00:00)

## 2020-12-14 NOTE — Patient Instructions (Signed)

## 2020-12-16 ENCOUNTER — Other Ambulatory Visit (HOSPITAL_COMMUNITY): Payer: Self-pay

## 2020-12-16 ENCOUNTER — Inpatient Hospital Stay (HOSPITAL_BASED_OUTPATIENT_CLINIC_OR_DEPARTMENT_OTHER): Payer: No Typology Code available for payment source | Admitting: Medical

## 2020-12-16 ENCOUNTER — Other Ambulatory Visit: Payer: Self-pay

## 2020-12-16 VITALS — BP 119/92 | HR 100 | Temp 97.9°F | Resp 18 | Wt 202.2 lb

## 2020-12-16 DIAGNOSIS — J4520 Mild intermittent asthma, uncomplicated: Secondary | ICD-10-CM

## 2020-12-16 DIAGNOSIS — C50412 Malignant neoplasm of upper-outer quadrant of left female breast: Secondary | ICD-10-CM

## 2020-12-16 DIAGNOSIS — T7840XA Allergy, unspecified, initial encounter: Secondary | ICD-10-CM

## 2020-12-16 DIAGNOSIS — Z170 Estrogen receptor positive status [ER+]: Secondary | ICD-10-CM

## 2020-12-16 MED ORDER — ALBUTEROL SULFATE HFA 108 (90 BASE) MCG/ACT IN AERS: 1.0000 | INHALATION_SPRAY | RESPIRATORY_TRACT | 5 refills | Status: DC | PRN

## 2020-12-16 MED ORDER — PREDNISONE 5 MG PO TABS: ORAL_TABLET | ORAL | 0 refills | Status: DC

## 2020-12-16 NOTE — Progress Notes (Signed)
[facility] Progress Note   [name] [id number] 06/03/1976 45 y.o.  [name] is managed by Dr. 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 Jana Hakim and is status post cycle 2, day 1 of Keytruda which was dosed on 12/14/2020.  She is scheduled to be seen in follow-up on 01/25/2021.  History of asthma with suspected seasonal allergies: Patient was given a prescription for an albuterol inhaler and was given a prescription for a prednisone taper.  Please see After Visit Summary for patient specific instructions.  Future Appointments  Date Time Provider [place]  01/04/2021  9:00 AM [facility] LAB [facility] None  01/04/2021  9:15 AM [facility] INFUSION [facility] None  01/04/2021 10:00 AM [facility] INFUSION [facility] None  01/18/2021  9:30 AM Collie Siad A, PTA [facility] None  01/25/2021 10:00 AM [facility] LAB [facility] None  01/25/2021 10:15 AM [facility] [place] FLUSH [facility] None  01/25/2021 11:00 AM Magrinat, Virgie Dad, MD [facility] None  01/25/2021 12:00 PM [facility] INFUSION [facility] None  02/15/2021  9:30 AM [facility] LAB [facility] None  02/15/2021  9:45 AM [facility] [place] FLUSH [facility] None  02/15/2021 10:30 AM [facility] INFUSION [facility] None    No orders of the defined types were placed in this encounter.      Subjective:   Patient ID:  [name] is a 45 y.o. (DOB 1975-10-09)  female.  Chief Complaint: No chief complaint on file.   HPI [name]  is a 45 y.o. female with a diagnosis of a stage IIIc functionally triple negative invasive ductal carcinoma of the left breast.  She is followed by Dr. Jana Hakim and is status post cycle 2, day 1 of Keytruda which was dosed on 12/14/2020.  She presents to the clinic today as a walk-in.  She reports that she is having allergy-like symptoms and is having some shortness of breath and feels as though she cannot catch her breath at night.  She also has had some chest pressure and reports anxiety.  She has been using an old albuterol inhaler at home which helps.  She reports that she has a history of seasonal allergies and asthma.  She denies fevers, chills, sweats, postnasal drainage or a productive cough.   Medications: I have reviewed the patient's current medications.  Allergies:  Allergies  Allergen Reactions  . Doxorubicin Hcl Rash    Experience chest tightness, abdominal pain, and redness around lips during and shortly after doxorubicin bolus.     Past Medical History:  Diagnosis Date  . Allergy   . Asthma   . Breast cancer in female [facility])    Left  . Breast disorder    breast cancer May 2021  . Chronic back pain   . History of breast cancer   . Hx of migraines   . Morbid obesity ([place])     Past Surgical History:  Procedure Laterality Date  . BREAST LUMPECTOMY WITH RADIOACTIVE SEED AND SENTINEL LYMPH NODE BIOPSY Left 07/15/2020   Procedure: LEFT BREAST LUMPECTOMY WITH RADIOACTIVE SEED AND LEFT AXILLARY  SENTINEL LYMPH NODE BIOPSY, LEFT AXILLARY NODE RADIOACTIVE SEED GUIDED EXCISION, LEFT BREAST RADIOACTIVE SEED GUIDED EXCISION IM NODE;  Surgeon: Rolm Bookbinder, MD;  Location: [facility];  Service: General;  Laterality: Left;  PEC BLOCK  . CESAREAN SECTION WITH BILATERAL TUBAL LIGATION Bilateral 07/23/2015   Procedure: CESAREAN SECTION WITH BILATERAL TUBAL LIGATION;  Surgeon: Donnamae Jude, MD;   Location: [facility];  Service: Obstetrics;  Laterality: Bilateral;  . PORTACATH PLACEMENT Right 02/11/2020   Procedure: INSERTION PORT-A-CATH WITH ULTRASOUND GUIDANCE;  Surgeon: Rolm Bookbinder, MD;  Location: [facility];  Service: General;  Laterality: Right;  . TUBAL LIGATION      Family History  Problem Relation Age of Onset  . Heart disease Maternal Grandmother     Social History   Socioeconomic History  . Marital status: Legally Separated    Spouse name: Not on file  . Number of children: 3  . Years of education: Not on file  . Highest education level: 9th grade  Occupational History  . Occupation: Architect  Tobacco Use  . Smoking status: Never Smoker  . Smokeless tobacco: Never Used  Vaping Use  . Vaping Use: Never used  Substance and Sexual Activity  . Alcohol use: No  . Drug use: No  . Sexual activity: Yes    Birth control/protection: Surgical  Other Topics Concern  . Not on file  Social History Narrative  . Not on file   Social Determinants of Health   Financial Resource Strain: Not on file  Food Insecurity: Not on file  Transportation Needs: No Transportation Needs  . Lack of Transportation (Medical): No  . Lack of Transportation (Non-Medical): No  Physical Activity: Not on file  Stress: Not on file  Social Connections: Not on file  Intimate Partner Violence: Not on file    Past Medical History, Surgical history, Social history, and Family history were reviewed and updated as appropriate.   Please see review of systems for further details on the patient's review from today.   Review of Systems:  Review of Systems  Constitutional: Negative for chills, diaphoresis and fever.  HENT: Positive for congestion, rhinorrhea and sneezing. Negative for trouble swallowing.   Respiratory: Positive for chest tightness and shortness of breath. Negative for cough, choking, wheezing and stridor.   Cardiovascular: Positive for chest pain. Negative  for palpitations.    Objective:   Physical Exam:  BP (!) 119/92   Pulse 100   Temp 97.9 F (36.6 C) (Tympanic)   Resp 18   Wt 202 lb 4 oz (91.7 kg)   LMP 01/20/2020 (Exact Date)   SpO2 100%   BMI 36.99 kg/m  ECOG: 0  Physical Exam Constitutional:      General: She is not in acute distress.    Appearance: She is not diaphoretic.  HENT:     Ears:     Comments: The patient was noted to have allergic shiners. Cardiovascular:     Rate and Rhythm: Normal rate and regular rhythm.     Heart sounds: Normal heart sounds. No murmur heard. No friction rub. No gallop.   Pulmonary:     Effort: Pulmonary effort is normal. No respiratory distress.     Breath sounds: Normal breath sounds. No wheezing or rales.  Skin:    General: Skin is warm and dry.     Findings: No erythema or rash.  Neurological:     Mental Status: She is alert.     Coordination: Coordination normal.  Psychiatric:        Behavior: Behavior normal.        Thought Content: Thought content normal.        Judgment: Judgment normal.    The patient was seen with the interpreter present.  Lab Review:     Component Value Date/Time   NA 139 12/14/2020 1230   NA 138 08/15/2017 1525   K 3.8 12/14/2020 1230   CL 105 12/14/2020 1230   CO2 23 12/14/2020 1230   GLUCOSE 99 12/14/2020 1230   BUN 11 12/14/2020 1230   BUN 12 08/15/2017 1525   CREATININE 0.71 12/14/2020 1230   CREATININE 0.77 02/26/2020 0915   CALCIUM 9.1 12/14/2020 1230   PROT 7.4 12/14/2020 1230   PROT 7.4 01/03/2017 1642   ALBUMIN 3.9 12/14/2020 1230   ALBUMIN 4.3 01/03/2017 1642   AST 24 12/14/2020 1230   AST 17 02/26/2020 0915   ALT 30 12/14/2020 1230   ALT 21 02/26/2020 0915   ALKPHOS 106 12/14/2020 1230   BILITOT 0.3 12/14/2020 1230   BILITOT <0.2 (L) 02/26/2020 0915   GFRNONAA >60 12/14/2020 1230   GFRNONAA >60 02/26/2020 0915   GFRAA >60 06/03/2020 0949   GFRAA >60 02/26/2020 0915       Component Value Date/Time   WBC 4.3  12/14/2020 1230   RBC 4.53 12/14/2020 1230   HGB 12.5 12/14/2020 1230   HGB 12.0 02/26/2020 0915   HCT 38.0 12/14/2020 1230   PLT 215 12/14/2020 1230   PLT 158 02/26/2020 0915   MCV 83.9 12/14/2020 1230   MCV 80.9 01/03/2017 1651   MCH 27.6 12/14/2020 1230   MCHC 32.9 12/14/2020 1230   RDW 14.3 12/14/2020 1230   LYMPHSABS 1.2 12/14/2020 1230   MONOABS 0.4 12/14/2020 1230   EOSABS 0.2 12/14/2020 1230   BASOSABS 0.0 12/14/2020 1230   -------------------------------  Imaging from last 24 hours (if applicable):  Radiology interpretation: No results found.

## 2021-01-04 ENCOUNTER — Ambulatory Visit (HOSPITAL_COMMUNITY)
Admission: RE | Admit: 2021-01-04 | Discharge: 2021-01-04 | Disposition: A | Discharge status: Home / Self Care | Payer: No Typology Code available for payment source | Source: Ambulatory Visit | Attending: Medical | Admitting: Medical

## 2021-01-04 ENCOUNTER — Other Ambulatory Visit: Payer: Self-pay

## 2021-01-04 ENCOUNTER — Ambulatory Visit (HOSPITAL_BASED_OUTPATIENT_CLINIC_OR_DEPARTMENT_OTHER): Payer: No Typology Code available for payment source | Admitting: Medical

## 2021-01-04 ENCOUNTER — Inpatient Hospital Stay: Payer: No Typology Code available for payment source

## 2021-01-04 ENCOUNTER — Other Ambulatory Visit: Payer: Self-pay | Admitting: Medical

## 2021-01-04 VITALS — BP 120/68 | HR 73 | Temp 98.3°F | Resp 18 | Wt 202.0 lb

## 2021-01-04 DIAGNOSIS — Z170 Estrogen receptor positive status [ER+]: Secondary | ICD-10-CM

## 2021-01-04 DIAGNOSIS — C50412 Malignant neoplasm of upper-outer quadrant of left female breast: Secondary | ICD-10-CM

## 2021-01-04 DIAGNOSIS — R059 Cough, unspecified: Secondary | ICD-10-CM | POA: Insufficient documentation

## 2021-01-04 DIAGNOSIS — Z95828 Presence of other vascular implants and grafts: Secondary | ICD-10-CM

## 2021-01-04 DIAGNOSIS — R0602 Shortness of breath: Secondary | ICD-10-CM

## 2021-01-04 LAB — CBC WITH DIFFERENTIAL/PLATELET
Abs Immature Granulocytes: 0.02 10*3/uL (ref 0.00–0.07)
Basophils Absolute: 0 10*3/uL (ref 0.0–0.1)
Basophils Relative: 1 %
Eosinophils Absolute: 0.4 10*3/uL (ref 0.0–0.5)
Eosinophils Relative: 12 %
HCT: 36.9 % (ref 36.0–46.0)
Hemoglobin: 12 g/dL (ref 12.0–15.0)
Immature Granulocytes: 1 %
Lymphocytes Relative: 31 %
Lymphs Abs: 1.1 10*3/uL (ref 0.7–4.0)
MCH: 27.8 pg (ref 26.0–34.0)
MCHC: 32.5 g/dL (ref 30.0–36.0)
MCV: 85.4 fL (ref 80.0–100.0)
Monocytes Absolute: 0.3 10*3/uL (ref 0.1–1.0)
Monocytes Relative: 8 %
Neutro Abs: 1.7 10*3/uL (ref 1.7–7.7)
Neutrophils Relative %: 47 %
Platelets: 204 10*3/uL (ref 150–400)
RBC: 4.32 MIL/uL (ref 3.87–5.11)
RDW: 14.1 % (ref 11.5–15.5)
WBC: 3.6 10*3/uL — ABNORMAL LOW (ref 4.0–10.5)
nRBC: 0 % (ref 0.0–0.2)

## 2021-01-04 LAB — COMPREHENSIVE METABOLIC PANEL
ALT: 22 U/L (ref 0–44)
AST: 22 U/L (ref 15–41)
Albumin: 3.7 g/dL (ref 3.5–5.0)
Alkaline Phosphatase: 108 U/L (ref 38–126)
Anion gap: 10 (ref 5–15)
BUN: 10 mg/dL (ref 6–20)
CO2: 25 mmol/L (ref 22–32)
Calcium: 9.1 mg/dL (ref 8.9–10.3)
Chloride: 105 mmol/L (ref 98–111)
Creatinine, Ser: 0.69 mg/dL (ref 0.44–1.00)
GFR, Estimated: >60 mL/min (ref >60)
Glucose, Bld: 98 mg/dL (ref 70–99)
Potassium: 4.3 mmol/L (ref 3.5–5.1)
Sodium: 140 mmol/L (ref 135–145)
Total Bilirubin: 0.3 mg/dL (ref 0.3–1.2)
Total Protein: 6.6 g/dL (ref 6.5–8.1)

## 2021-01-04 LAB — TSH: TSH: 1.72 u[IU]/mL (ref 0.308–3.960)

## 2021-01-04 MED ORDER — SODIUM CHLORIDE 0.9 % IV SOLN
Freq: Once | INTRAVENOUS | Status: AC
  Filled 2021-01-04: qty 250

## 2021-01-04 MED ORDER — SODIUM CHLORIDE 0.9% FLUSH
10.0000 mL | Freq: Once | INTRAVENOUS | Status: AC
  Administered 2021-01-04: 10 mL
  Filled 2021-01-04: qty 10

## 2021-01-04 MED ORDER — FLUTICASONE PROPIONATE HFA 110 MCG/ACT IN AERO: 1.0000 | INHALATION_SPRAY | Freq: Two times a day (BID) | RESPIRATORY_TRACT | 2 refills | Status: DC

## 2021-01-04 MED ORDER — SODIUM CHLORIDE 0.9 % IV SOLN
200.0000 mg | Freq: Once | INTRAVENOUS | Status: AC
  Administered 2021-01-04: 200 mg via INTRAVENOUS
  Filled 2021-01-04: qty 8

## 2021-01-04 MED ORDER — BENZONATATE 200 MG PO CAPS: 200.0000 mg | ORAL_CAPSULE | Freq: Three times a day (TID) | ORAL | 0 refills | Status: DC | PRN

## 2021-01-04 MED ORDER — GUAIFENESIN-CODEINE 100-10 MG/5ML PO SOLN: 5.0000 mL | ORAL | 0 refills | Status: DC | PRN

## 2021-01-04 MED ORDER — AZITHROMYCIN 250 MG PO TABS: ORAL_TABLET | ORAL | 0 refills | Status: DC

## 2021-01-04 MED ORDER — HEPARIN SOD (PORK) LOCK FLUSH 100 UNIT/ML IV SOLN
500.0000 [IU] | Freq: Once | INTRAVENOUS | Status: AC | PRN
  Administered 2021-01-04: 500 [IU]
  Filled 2021-01-04: qty 5

## 2021-01-04 MED ORDER — SODIUM CHLORIDE 0.9% FLUSH
10.0000 mL | INTRAVENOUS | Status: DC | PRN
  Administered 2021-01-04: 10 mL
  Filled 2021-01-04: qty 10

## 2021-01-04 NOTE — Patient Instructions (Signed)
[facility]  Discharge Instructions: Thank you for choosing [facility] to provide your oncology and hematology care.   If you have a lab appointment with the [facility], please go directly to the [facility] and check in at the registration area.   Wear comfortable clothing and clothing appropriate for easy access to any Portacath or PICC line.   We strive to give you quality time with your provider. You may need to reschedule your appointment if you arrive late (15 or more minutes).  Arriving late affects you and other patients whose appointments are after yours.  Also, if you miss three or more appointments without notifying the office, you may be dismissed from the clinic at the provider's discretion.      For prescription refill requests, have your pharmacy contact our office and allow 72 hours for refills to be completed.    Today you received the following chemotherapy and/or immunotherapy agents pembroilizumab    To help prevent nausea and vomiting after your treatment, we encourage you to take your nausea medication as directed.  BELOW ARE SYMPTOMS THAT SHOULD BE REPORTED IMMEDIATELY: . *FEVER GREATER THAN 100.4 F (38 C) OR HIGHER . *CHILLS OR SWEATING . *NAUSEA AND VOMITING THAT IS NOT CONTROLLED WITH YOUR NAUSEA MEDICATION . *UNUSUAL SHORTNESS OF BREATH . *UNUSUAL BRUISING OR BLEEDING . *URINARY PROBLEMS (pain or burning when urinating, or frequent urination) . *BOWEL PROBLEMS (unusual diarrhea, constipation, pain near the anus) . TENDERNESS IN MOUTH AND THROAT WITH OR WITHOUT PRESENCE OF ULCERS (sore throat, sores in mouth, or a toothache) . UNUSUAL RASH, SWELLING OR PAIN  . UNUSUAL VAGINAL DISCHARGE OR ITCHING   Items with * indicate a potential emergency and should be followed up as soon as possible or go to the Emergency Department if any problems should occur.  Please show the CHEMOTHERAPY ALERT CARD or IMMUNOTHERAPY  ALERT CARD at check-in to the Emergency Department and triage nurse.  Should you have questions after your visit or need to cancel or reschedule your appointment, please contact [facility]  Dept: [phone]  and follow the prompts.  Office hours are 8:00 a.m. to 4:30 p.m. Monday - Friday. Please note that voicemails left after 4:00 p.m. may not be returned until the following business day.  We are closed weekends and major holidays. You have access to a nurse at all times for urgent questions. Please call the main number to the clinic Dept: [phone] and follow the prompts.   For any non-urgent questions, you may also contact your provider using MyChart. We now offer e-Visits for anyone 79 and older to request care online for non-urgent symptoms. For details visit [email or web address].   Also download the MyChart app! Go to the app store, search "MyChart", open the app, select [facility], and log in with your MyChart username and password.  Due to Covid, a mask is required upon entering the hospital/clinic. If you do not have a mask, one will be given to you upon arrival. For doctor visits, patients may have 1 support person aged 40 or older with them. For treatment visits, patients cannot have anyone with them due to current Covid guidelines and our immunocompromised population.

## 2021-01-11 NOTE — Progress Notes (Signed)
These preliminary result these preliminary results were noted.  Awaiting final report.

## 2021-01-11 NOTE — Progress Notes (Signed)
[facility]pt[facility] Progress Note   [name] [id number] Feb 02, 1976 45 y.o.  [name] is managed by Dr. 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 Jana Hakim and is status post cycle 2, day 1 of Keytruda which was dosed on 12/14/2020.  She was seen today while she was receiving cycle 3, day 1 of Keytruda.  She is scheduled to be seen in follow-up on 01/25/2021.  History of asthma with suspected seasonal allergies: Patient was given a prescription for an albuterol inhaler and was given a prescription for a prednisone taper at her last visit.  She continues to have some shortness of breath and a cough despite her use of an albuterol inhaler and prednisone taper.  She was given prescriptions for a steroid inhaler, azithromycin, and Tessalon Perles.  She was referred for a chest x-ray.  Please see After Visit Summary for patient specific instructions.  Future Appointments  Date Time Provider [place]  01/18/2021  9:30 AM Suanne Marker, PTA [facility] None  01/25/2021 10:15 AM [facility] [place] FLUSH [facility] None  01/25/2021 11:00 AM Magrinat, Virgie Dad, MD [facility] None  01/25/2021 12:00 PM [facility] INFUSION [facility] None  02/15/2021  9:45 AM [facility] [place] FLUSH [facility] None  02/15/2021 10:30 AM [facility] INFUSION [facility] None    No orders of the defined types were placed in this encounter.      Subjective:   Patient ID:  [name] is a 45 y.o. (DOB 08/11/1976)  female.  Chief Complaint: No chief complaint on file.   HPI [name]  is a 45 y.o. female with a diagnosis of a stage IIIc functionally triple negative invasive ductal carcinoma of the left breast.  She is followed by Dr. Jana Hakim and is status post cycle 2, day 1 of Keytruda which was dosed on 12/14/2020.  She was seen in infusion today while she was receiving Keytruda.  Despite her recent treatment with an albuterol inhaler and a prednisone taper she continues to have shortness of breath and a cough.  She reported at her last visit that she had a history of asthma.  She denies fevers, chills, or sweats.  Medications: I have reviewed the patient's current medications.  Allergies:  Allergies  Allergen Reactions  . Doxorubicin Hcl Rash    Experience chest tightness, abdominal pain, and redness around lips during and shortly after doxorubicin bolus.     Past Medical History:  Diagnosis Date  . Allergy   . Asthma   . Breast cancer in female [facility])    Left  . Breast disorder    breast cancer May 2021  . Chronic back pain   . History of breast cancer   . Hx of migraines   . Morbid obesity ([place])     Past Surgical History:  Procedure Laterality Date  . BREAST LUMPECTOMY WITH RADIOACTIVE SEED AND SENTINEL LYMPH NODE BIOPSY Left 07/15/2020   Procedure: LEFT BREAST LUMPECTOMY WITH RADIOACTIVE SEED AND LEFT AXILLARY SENTINEL LYMPH NODE BIOPSY, LEFT AXILLARY NODE RADIOACTIVE SEED GUIDED EXCISION, LEFT BREAST RADIOACTIVE SEED GUIDED  EXCISION IM NODE;  Surgeon: Rolm Bookbinder, MD;  Location: [place];  Service: General;  Laterality: Left;  PEC BLOCK  . CESAREAN SECTION WITH BILATERAL TUBAL LIGATION Bilateral 07/23/2015   Procedure: CESAREAN SECTION WITH BILATERAL TUBAL LIGATION;  Surgeon: Donnamae Jude, MD;  Location: [place] [facility];  Service: Obstetrics;  Laterality: Bilateral;  . PORTACATH PLACEMENT Right 02/11/2020   Procedure: INSERTION PORT-A-CATH WITH ULTRASOUND GUIDANCE;  Surgeon:  Rolm Bookbinder, MD;  Location: [place];  Service: General;  Laterality: Right;  . TUBAL LIGATION      Family History  Problem Relation Age of Onset  . Heart disease Maternal Grandmother     Social History   Socioeconomic History  . Marital status: Legally Separated    Spouse name: Not on file  . Number of children: 3  . Years of education: Not on file  . Highest education level: 9th grade  Occupational History  . Occupation: Architect  Tobacco Use  . Smoking status: Never Smoker  . Smokeless tobacco: Never Used  Vaping Use  . Vaping Use: Never used  Substance and Sexual Activity  . Alcohol use: No  . Drug use: No  . Sexual activity: Yes    Birth control/protection: Surgical  Other Topics Concern  . Not on file  Social History Narrative  . Not on file   Social Determinants of Health   Financial Resource Strain: Not on file  Food Insecurity: Not on file  Transportation Needs: No Transportation Needs  . Lack of Transportation (Medical): No  . Lack of Transportation (Non-Medical): No  Physical Activity: Not on file  Stress: Not on file  Social Connections: Not on file  Intimate Partner Violence: Not on file    Past Medical History, Surgical history, Social history, and Family history were reviewed and updated as appropriate.   Please see review of systems for further details on the patient's review from today.   Review of Systems:  Review of Systems  Constitutional: Negative for chills, diaphoresis and fever.  HENT: Negative for congestion, rhinorrhea, sneezing and trouble swallowing.   Respiratory: Positive for cough, chest tightness and shortness of breath. Negative for choking, wheezing and stridor.   Cardiovascular: Negative for chest pain and palpitations.    Objective:   Physical Exam:  LMP 01/20/2020 (Exact Date)  ECOG: 0  Physical Exam Constitutional:      General: She is not in acute distress.    Appearance: She is not  diaphoretic.  Cardiovascular:     Rate and Rhythm: Normal rate and regular rhythm.     Heart sounds: Normal heart sounds. No murmur heard. No friction rub. No gallop.   Pulmonary:     Effort: Pulmonary effort is normal. No respiratory distress.     Breath sounds: Normal breath sounds. No wheezing or rales.  Skin:    General: Skin is warm and dry.     Findings: No erythema or rash.  Neurological:     Mental Status: She is alert.     Coordination: Coordination normal.  Psychiatric:        Behavior: Behavior normal.        Thought Content: Thought content normal.        Judgment: Judgment normal.    The patient was seen with the interpreter present.  Lab Review:     Component Value Date/Time   NA 140 01/04/2021 0940   NA 138 08/15/2017 1525   K 4.3 01/04/2021 0940   CL 105 01/04/2021  0940   CO2 25 01/04/2021 0940   GLUCOSE 98 01/04/2021 0940   BUN 10 01/04/2021 0940   BUN 12 08/15/2017 1525   CREATININE 0.69 01/04/2021 0940   CREATININE 0.77 02/26/2020 0915   CALCIUM 9.1 01/04/2021 0940   PROT 6.6 01/04/2021 0940   PROT 7.4 01/03/2017 1642   ALBUMIN 3.7 01/04/2021 0940   ALBUMIN 4.3 01/03/2017 1642   AST 22 01/04/2021 0940   AST 17 02/26/2020 0915   ALT 22 01/04/2021 0940   ALT 21 02/26/2020 0915   ALKPHOS 108 01/04/2021 0940   BILITOT 0.3 01/04/2021 0940   BILITOT <0.2 (L) 02/26/2020 0915   GFRNONAA >60 01/04/2021 0940   GFRNONAA >60 02/26/2020 0915   GFRAA >60 06/03/2020 0949   GFRAA >60 02/26/2020 0915       Component Value Date/Time   WBC 3.6 (L) 01/04/2021 0940   RBC 4.32 01/04/2021 0940   HGB 12.0 01/04/2021 0940   HGB 12.0 02/26/2020 0915   HCT 36.9 01/04/2021 0940   PLT 204 01/04/2021 0940   PLT 158 02/26/2020 0915   MCV 85.4 01/04/2021 0940   MCV 80.9 01/03/2017 1651   MCH 27.8 01/04/2021 0940   MCHC 32.5 01/04/2021 0940   RDW 14.1 01/04/2021 0940   LYMPHSABS 1.1 01/04/2021 0940   MONOABS 0.3 01/04/2021 0940   EOSABS 0.4 01/04/2021 0940    BASOSABS 0.0 01/04/2021 0940   -------------------------------  Imaging from last 24 hours (if applicable):  Radiology interpretation: DG Chest 2 View  Result Date: 01/04/2021 CLINICAL DATA:  Cough and wheezing.  History of breast cancer. EXAM: CHEST - 2 VIEW COMPARISON:  03/19/2020 FINDINGS: A right jugular Port-A-Cath terminates over the lower SVC, unchanged. The cardiomediastinal silhouette is unchanged with normal heart size. The lungs are well inflated. No airspace consolidation, edema, pleural effusion, pneumothorax is identified. Surgical clips project in the left breast. No acute osseous abnormality is seen. IMPRESSION: No active cardiopulmonary disease. Electronically Signed   By: Logan Bores M.D.   On: 01/04/2021 13:27

## 2021-01-12 ENCOUNTER — Telehealth: Payer: Self-pay | Admitting: *Deleted

## 2021-01-12 ENCOUNTER — Telehealth: Payer: Self-pay | Admitting: Oncology

## 2021-01-12 DIAGNOSIS — Z170 Estrogen receptor positive status [ER+]: Secondary | ICD-10-CM

## 2021-01-12 DIAGNOSIS — C50412 Malignant neoplasm of upper-outer quadrant of left female breast: Secondary | ICD-10-CM

## 2021-01-12 NOTE — Telephone Encounter (Signed)
[name] came by to see if patient can see Dr Jana Hakim. Pt is having severe cough, chest sore from coughing, no energy-fatigue.  They saw Lucianne Lei last week .  Message to scheduler to see Dr Jana Hakim tomorrow with labs

## 2021-01-12 NOTE — Telephone Encounter (Signed)
Scheduled appt per 5/3 sch msg. Pt aware.  

## 2021-01-13 ENCOUNTER — Inpatient Hospital Stay: Payer: No Typology Code available for payment source | Attending: Oncology

## 2021-01-13 ENCOUNTER — Inpatient Hospital Stay (HOSPITAL_BASED_OUTPATIENT_CLINIC_OR_DEPARTMENT_OTHER): Payer: Self-pay | Admitting: Oncology

## 2021-01-13 ENCOUNTER — Other Ambulatory Visit: Payer: Self-pay

## 2021-01-13 ENCOUNTER — Encounter: Payer: Self-pay | Admitting: Oncology

## 2021-01-13 VITALS — BP 132/74 | HR 90 | Temp 97.7°F | Resp 20 | Ht 62.0 in | Wt 196.3 lb

## 2021-01-13 DIAGNOSIS — K219 Gastro-esophageal reflux disease without esophagitis: Secondary | ICD-10-CM | POA: Insufficient documentation

## 2021-01-13 DIAGNOSIS — C773 Secondary and unspecified malignant neoplasm of axilla and upper limb lymph nodes: Secondary | ICD-10-CM | POA: Insufficient documentation

## 2021-01-13 DIAGNOSIS — Z170 Estrogen receptor positive status [ER+]: Secondary | ICD-10-CM | POA: Insufficient documentation

## 2021-01-13 DIAGNOSIS — M549 Dorsalgia, unspecified: Secondary | ICD-10-CM | POA: Insufficient documentation

## 2021-01-13 DIAGNOSIS — E669 Obesity, unspecified: Secondary | ICD-10-CM | POA: Insufficient documentation

## 2021-01-13 DIAGNOSIS — C50412 Malignant neoplasm of upper-outer quadrant of left female breast: Secondary | ICD-10-CM

## 2021-01-13 DIAGNOSIS — Z923 Personal history of irradiation: Secondary | ICD-10-CM | POA: Insufficient documentation

## 2021-01-13 DIAGNOSIS — Z79899 Other long term (current) drug therapy: Secondary | ICD-10-CM | POA: Insufficient documentation

## 2021-01-13 DIAGNOSIS — R5383 Other fatigue: Secondary | ICD-10-CM | POA: Insufficient documentation

## 2021-01-13 DIAGNOSIS — R059 Cough, unspecified: Secondary | ICD-10-CM | POA: Insufficient documentation

## 2021-01-13 DIAGNOSIS — R1013 Epigastric pain: Secondary | ICD-10-CM | POA: Insufficient documentation

## 2021-01-13 LAB — CBC WITH DIFFERENTIAL (CANCER CENTER ONLY)
Abs Immature Granulocytes: 0.01 10*3/uL (ref 0.00–0.07)
Basophils Absolute: 0 10*3/uL (ref 0.0–0.1)
Basophils Relative: 0 %
Eosinophils Absolute: 0.3 10*3/uL (ref 0.0–0.5)
Eosinophils Relative: 8 %
HCT: 38.8 % (ref 36.0–46.0)
Hemoglobin: 12.9 g/dL (ref 12.0–15.0)
Immature Granulocytes: 0 %
Lymphocytes Relative: 29 %
Lymphs Abs: 1.2 10*3/uL (ref 0.7–4.0)
MCH: 27.9 pg (ref 26.0–34.0)
MCHC: 33.2 g/dL (ref 30.0–36.0)
MCV: 84 fL (ref 80.0–100.0)
Monocytes Absolute: 0.3 10*3/uL (ref 0.1–1.0)
Monocytes Relative: 7 %
Neutro Abs: 2.3 10*3/uL (ref 1.7–7.7)
Neutrophils Relative %: 56 %
Platelet Count: 210 10*3/uL (ref 150–400)
RBC: 4.62 MIL/uL (ref 3.87–5.11)
RDW: 13.9 % (ref 11.5–15.5)
WBC Count: 4.1 10*3/uL (ref 4.0–10.5)
nRBC: 0 % (ref 0.0–0.2)

## 2021-01-13 LAB — CMP (CANCER CENTER ONLY)
ALT: 24 U/L (ref 0–44)
AST: 24 U/L (ref 15–41)
Albumin: 3.9 g/dL (ref 3.5–5.0)
Alkaline Phosphatase: 100 U/L (ref 38–126)
Anion gap: 7 (ref 5–15)
BUN: 13 mg/dL (ref 6–20)
CO2: 26 mmol/L (ref 22–32)
Calcium: 9.5 mg/dL (ref 8.9–10.3)
Chloride: 107 mmol/L (ref 98–111)
Creatinine: 0.78 mg/dL (ref 0.44–1.00)
GFR, Estimated: >60 mL/min (ref >60)
Glucose, Bld: 113 mg/dL — ABNORMAL HIGH (ref 70–99)
Potassium: 3.9 mmol/L (ref 3.5–5.1)
Sodium: 140 mmol/L (ref 135–145)
Total Bilirubin: 0.4 mg/dL (ref 0.3–1.2)
Total Protein: 7.3 g/dL (ref 6.5–8.1)

## 2021-01-13 LAB — TSH: TSH: 1.149 u[IU]/mL (ref 0.350–4.500)

## 2021-01-13 MED ORDER — OMEPRAZOLE 40 MG PO CPDR
40.0000 mg | DELAYED_RELEASE_CAPSULE | Freq: Every day | ORAL | 0 refills | Status: DC
Start: 2021-01-13 — End: 2021-11-29

## 2021-01-13 NOTE — Progress Notes (Signed)
[place]  [phone] [phone]     ID: Marlette Curvin DOB: [birth year]  MR#: [id number]  BTD#:[id number]  Patient Care Team: Jacelyn Pi, Lilia Argue, MD as PCP - General (Family Medicine) Rolm Bookbinder, MD as Consulting Physician (General Surgery) Tevin Shillingford, Virgie Dad, MD as Consulting Physician (Oncology) Eppie Gibson, MD as Attending Physician (Radiation Oncology) Rockwell Germany, RN as Oncology Nurse Navigator Mauro Kaufmann, RN as Oncology Nurse Navigator Chauncey Cruel, MD OTHER MD:  CHIEF COMPLAINT: Triple negative breast cancer  CURRENT TREATMENT: pembrolizumab   INTERVAL HISTORY:  Kalynne returns today for follow up and treatment of her triple negative breast cancer.  She is accompanied by one of our interpreters, Christia Reading.  She requested to be seen today for severe cough and fatigue. She was previously seen in the symptom management clinic on 01/04/2021 for the same issue. Chest x-ray was performed that day showing no active cardiopulmonary disease.  She began pembrolizumab on 11/23/2020.   Her most recent echocardiogram on 10/21/2020 showed an ejection fraction of 60-65%.  We are following her TSH: Lab Results  Component Value Date   TSH 1.720 01/04/2021   TSH 3.924 12/14/2020   TSH 2.500 09/25/2020   TSH 2.493 07/13/2013    REVIEW OF SYSTEMS: Sumire continues to have an uncontrollable cough and she coughed quite a bit throughout our visit today.  She is using inhalers and has had a round of antibiotics and steroids through her primary care physician none of which took care of the problem.  Today she tells me that she had a severe epigastric pain which lasted about an hour after eating.  She has had no fevers, there has been no phlegm production, she does not feel short of breath.  With these complaints last visit we obtained a chest x-ray which did not show any pneumonia or any other obvious abnormality.  A detailed  review of systems today was otherwise stable   COVID 19 VACCINATION STATUS: She had the J&J vaccine x1., infection 06/20/2020, and Pfizer booster on 08/19/2020   HISTORY OF CURRENT ILLNESS: From the original intake note:  Ahnesty Finfrock Perdomo herself palpated a left beast lump with associated pain. She underwent bilateral diagnostic mammography with tomography and left breast ultrasonography at [facility] on 01/23/2020 showing: breast density category B; palpable 3.6 cm left breast mass with associated calcifications at 1 o'clock; 0.8 cm left breast mass at 2 o'clock, concerning for abnormal intramammary lymph node; at least two abnormal left axillary lymph nodes.  Accordingly on 01/23/2020 she proceeded to biopsy of the left breast area in question. The pathology from this procedure ([id number]) showed: invasive ductal carcinoma at 1 o'clock, grade 3. Prognostic indicators significant for: estrogen receptor, 20% positive with moderate staining intensity and progesterone receptor, 0% negative. Proliferation marker Ki67 at 40%. HER2 negative by immunohistochemistry (1+).  The left axillary lymph node confirmed metastatic carcinoma.  The left intramammary lymph node was negative for carcinoma.  The patient's subsequent history is as detailed below.   PAST MEDICAL HISTORY: Past Medical History:  Diagnosis Date  . Allergy   . Asthma   . Breast cancer in female [facility])    Left  . Breast disorder    breast cancer May 2021  . Chronic back pain   . History of breast cancer   . Hx of migraines   . Morbid obesity ([place])     PAST SURGICAL HISTORY: Past Surgical History:  Procedure Laterality Date  .  BREAST LUMPECTOMY WITH RADIOACTIVE SEED AND SENTINEL LYMPH NODE BIOPSY Left 07/15/2020   Procedure: LEFT BREAST LUMPECTOMY WITH RADIOACTIVE SEED AND LEFT AXILLARY SENTINEL LYMPH NODE BIOPSY, LEFT AXILLARY NODE RADIOACTIVE SEED GUIDED EXCISION, LEFT BREAST RADIOACTIVE SEED GUIDED EXCISION IM NODE;   Surgeon: Rolm Bookbinder, MD;  Location: [place];  Service: General;  Laterality: Left;  PEC BLOCK  . CESAREAN SECTION WITH BILATERAL TUBAL LIGATION Bilateral 07/23/2015   Procedure: CESAREAN SECTION WITH BILATERAL TUBAL LIGATION;  Surgeon: Donnamae Jude, MD;  Location: [place] [facility];  Service: Obstetrics;  Laterality: Bilateral;  . PORTACATH PLACEMENT Right 02/11/2020   Procedure: INSERTION PORT-A-CATH WITH ULTRASOUND GUIDANCE;  Surgeon: Rolm Bookbinder, MD;  Location: [place];  Service: General;  Laterality: Right;  . TUBAL LIGATION      FAMILY HISTORY: Family History  Problem Relation Age of Onset  . Heart disease Maternal Grandmother    Her parents are both living as of 01/2020, her father age 21 and her mother age 42. She has three maternal half-siblings (1 sister, 2 brothers) and four paternal half-siblings (1 sister, 3 brothers). There is no family history of cancer to her knowledge.   GYNECOLOGIC HISTORY:  Patient's last menstrual period was 01/20/2020 (exact date). Menarche: 45 years old Age at first live birth: 45 years old Jal P 3 LMP 01/2020 Contraceptive s/p tubal ligation HRT n/a  Hysterectomy? no BSO? 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 Husband Vaughan Sine works for a Financial trader, both in [employer]. They are both from [place]. She lives at home with [place] and their three children-- [name], Ste. Marie, and [name]. She attends an [employer].    ADVANCED DIRECTIVES: In the absence of any documentation to the contrary, the patient's spouse is their HCPOA.    HEALTH MAINTENANCE: Social History   Tobacco Use  . Smoking status: Never Smoker  . Smokeless tobacco: Never Used  Vaping Use  . Vaping Use: Never used  Substance Use Topics  . Alcohol use: No  . Drug use: No     Colonoscopy: n/a (age)  PAP: 12/2020  Bone density: n/a  (age)   Allergies  Allergen Reactions  . Doxorubicin Hcl Rash    Experience chest tightness, abdominal pain, and redness around lips during and shortly after doxorubicin bolus.     Current Outpatient Medications  Medication Sig Dispense Refill  . omeprazole (PRILOSEC) 40 MG capsule Take 1 capsule (40 mg total) by mouth at bedtime. 30 capsule 0  . albuterol (VENTOLIN HFA) 108 (90 Base) MCG/ACT inhaler Inhale 1-2 puffs into the lungs every 4 (four) hours as needed for wheezing or shortness of breath. 8 g 5  . benzonatate (TESSALON) 200 MG capsule Take 1 capsule (200 mg total) by mouth 3 (three) times daily as needed for cough. 20 capsule 0  . fluticasone (FLOVENT HFA) 110 MCG/ACT inhaler Inhale 1 puff into the lungs in the morning and at bedtime. 1 each 2  . guaiFENesin-codeine 100-10 MG/5ML syrup Take 5 mLs by mouth every 4 (four) hours as needed for cough. Do not drive while taking this medication. 120 mL 0  . lidocaine-prilocaine (EMLA) cream APPLY  CREAM TOPICALLY TO AFFECTED AREA ONCE (Patient taking differently: APPLY  CREAM TOPICALLY TO AFFECTED AREA ONCE) 30 g 3  . loratadine (CLARITIN) 10 MG tablet Take 1 tablet (10 mg total) by mouth daily. 90 tablet 4   No current  facility-administered medications for this visit.    OBJECTIVE: Spanish speaker who appears stated age  85:   01/13/21 1338  BP: 132/74  Pulse: 90  Resp: 20  Temp: 97.7 F (36.5 C)  SpO2: 98%     Body mass index is 35.9 kg/m.   Wt Readings from Last 3 Encounters:  01/13/21 196 lb 4.8 oz (89 kg)  01/04/21 202 lb (91.6 kg)  12/16/20 202 lb 4 oz (91.7 kg)      ECOG FS:1 - Symptomatic but completely ambulatory  Sclerae unicteric, EOMs intact Wearing a mask No cervical or supraclavicular adenopathy Lungs no rales or rhonchi Heart regular rate and rhythm Abd soft, nontender, positive bowel sounds MSK no focal spinal tenderness, no upper extremity lymphedema Neuro: nonfocal, well oriented, appropriate  affect Breasts: The right breast is unremarkable.  The left breast has undergone lumpectomy and radiation with no evidence of local recurrence.  Both axillae are benign.   LAB RESULTS:  CMP     Component Value Date/Time   NA 140 01/13/2021 1305   NA 138 08/15/2017 1525   K 3.9 01/13/2021 1305   CL 107 01/13/2021 1305   CO2 26 01/13/2021 1305   GLUCOSE 113 (H) 01/13/2021 1305   BUN 13 01/13/2021 1305   BUN 12 08/15/2017 1525   CREATININE 0.78 01/13/2021 1305   CALCIUM 9.5 01/13/2021 1305   PROT 7.3 01/13/2021 1305   PROT 7.4 01/03/2017 1642   ALBUMIN 3.9 01/13/2021 1305   ALBUMIN 4.3 01/03/2017 1642   AST 24 01/13/2021 1305   ALT 24 01/13/2021 1305   ALKPHOS 100 01/13/2021 1305   BILITOT 0.4 01/13/2021 1305   GFRNONAA >60 01/13/2021 1305   GFRAA >60 06/03/2020 0949   GFRAA >60 02/26/2020 0915    No results found for: TOTALPROTELP, ALBUMINELP, A1GS, A2GS, BETS, BETA2SER, GAMS, MSPIKE, SPEI  Lab Results  Component Value Date   WBC 4.1 01/13/2021   NEUTROABS 2.3 01/13/2021   HGB 12.9 01/13/2021   HCT 38.8 01/13/2021   MCV 84.0 01/13/2021   PLT 210 01/13/2021    No results found for: LABCA2  No components found for: ZOXWRU045  No results for input(s): INR in the last 168 hours.  No results found for: LABCA2  No results found for: WUJ811  No results found for: BJY782  No results found for: NFA213  No results found for: CA2729  No components found for: HGQUANT  No results found for: CEA1 / No results found for: CEA1   No results found for: AFPTUMOR  No results found for: CHROMOGRNA  No results found for: KPAFRELGTCHN, LAMBDASER, KAPLAMBRATIO (kappa/lambda light chains)  No results found for: HGBA, HGBA2QUANT, HGBFQUANT, HGBSQUAN (Hemoglobinopathy evaluation)   No results found for: LDH  No results found for: IRON, TIBC, IRONPCTSAT (Iron and TIBC)  No results found for: FERRITIN  Urinalysis No results found for: COLORURINE, APPEARANCEUR,  LABSPEC, PHURINE, GLUCOSEU, HGBUR, BILIRUBINUR, KETONESUR, PROTEINUR, UROBILINOGEN, NITRITE, LEUKOCYTESUR   STUDIES: DG Chest 2 View  Result Date: 01/04/2021 CLINICAL DATA:  Cough and wheezing.  History of breast cancer. EXAM: CHEST - 2 VIEW COMPARISON:  03/19/2020 FINDINGS: A right jugular Port-A-Cath terminates over the lower SVC, unchanged. The cardiomediastinal silhouette is unchanged with normal heart size. The lungs are well inflated. No airspace consolidation, edema, pleural effusion, pneumothorax is identified. Surgical clips project in the left breast. No acute osseous abnormality is seen. IMPRESSION: No active cardiopulmonary disease. Electronically Signed   By: Logan Bores M.D.   On:  01/04/2021 13:27     ELIGIBLE FOR AVAILABLE RESEARCH PROTOCOL: [id number]?  ASSESSMENT: 45 y.o.  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  (a) chest CT scan and bone scan 02/07/2020 showed no evidence of metastatic disease  (1) neoadjuvant chemotherapy consisting of doxorubicin and cyclophosphamide in dose dense fashion x4 started 02/12/2020, completed 03/25/2020, followed by paclitaxel and carboplatin weekly x12 started 04/08/2020 and completed on 05/28/2020  (a) echo 02/07/2020 shows an ejection fraction in the 55-60% range  (b) Epirubicin substituted for Doxorubicin secondary to allergic rash from Doxorubicin  (2) status post left lumpectomy and sentinel lymph node sampling 07/15/2020 for a ypT1b ypN0 residual invasive ductal carcinoma, grade 3, with negative margins.  (a) a total of 4 left axillary lymph nodes were removed  (b) repeat prognostic panel on the final pathology again triple negative, with an MIB-1 of 50%.  (3) adjuvant radiation completed 10/09/2020  (a) sensitizing capecitabine attempted but not tolerated by the patient  (4)  genetics testing 02/07/2020 through the [facility] Common Hereditary Cancers Panel found no deleterious mutations in  APC, ATM, AXIN2, BARD1, BMPR1A, BRCA1, BRCA2, BRIP1, CDH1, CDKN2A (p14ARF), CDKN2A (p16INK4a), CKD4, CHEK2, CTNNA1, DICER1, EPCAM (Deletion/duplication testing only), GREM1 (promoter region deletion/duplication testing only), KIT, MEN1, MLH1, MSH2, MSH3, MSH6, MUTYH, NBN, NF1, NHTL1, PALB2, PDGFRA, PMS2, POLD1, POLE, PTEN, RAD50, RAD51C, RAD51D, RNF43, SDHB, SDHC, SDHD, SMAD4, SMARCA4. STK11, TP53, TSC1, TSC2, and VHL.  The following genes were evaluated for sequence changes only: SDHA and HOXB13 c.251G>A variant only.  (a) VUS in MSH6 called c.2744C>G identified.   (5)  adjuvant pembrolizumab started 11/23/2020, to continue for 1 year   PLAN: Adeline is now a half a year out from definitive surgery for her breast cancer with no evidence of disease recurrence.  She is tolerating pembrolizumab well.  Some days she says she has an itchy feeling all over and possibly that could be due to pembrolizumab.  Pembrolizumab certainly can cause pneumonitis.  The cough associated with that in my experience is very different, much milder than what Drea is experiencing and we did not see evidence of pneumonitis on chest x-ray most recently.  Suspect she has significant reflux.  I am going to start her on omeprazole which she will take every night for the next 4 weeks.  If after 2 or 3 weeks she finds that her cough is pretty much gone then that was the cause and no further evaluation is needed.  In any case however I am placing a referral to one of our pulmonologists Dr. Melvyn Novas, who has quite a bit of expertise on asthma and I am sure will be helpful to her  She will be set up for mammography in approximately 1 month.  Otherwise she will see me again in 12 weeks.  She knows to call for any other issue that may develop before then.  Total encounter time 25 minutes.* Total encounter time 25  minutes.Sarajane Jews C. Christelle Igoe, MD 01/13/21 2:37 PM Medical Oncology and Hematology [facility] [place], [place] [address] Tel. [phone]    Fax. [phone]   I, Wilburn Mylar, am acting as scribe for Dr. Virgie Dad. Trea Latner.  I, Lurline Del MD, have reviewed the above documentation for accuracy and completeness, and I agree with the above.   *Total Encounter Time as defined by the  Centers for Medicare and Medicaid Services includes, in addition to the face-to-face time of a patient visit (documented in the note above) non-face-to-face time: obtaining and reviewing outside history, ordering and reviewing medications, tests or procedures, care coordination (communications with other health care professionals or caregivers) and documentation in the medical record.

## 2021-01-18 ENCOUNTER — Other Ambulatory Visit: Payer: Self-pay

## 2021-01-18 ENCOUNTER — Telehealth: Payer: Self-pay | Admitting: Oncology

## 2021-01-18 ENCOUNTER — Ambulatory Visit: Payer: No Typology Code available for payment source | Attending: Oncology

## 2021-01-18 DIAGNOSIS — Z483 Aftercare following surgery for neoplasm: Secondary | ICD-10-CM | POA: Insufficient documentation

## 2021-01-18 NOTE — Telephone Encounter (Signed)
Scheduled per 5/4 los. Pt will receive an updated appt calendar at next visit

## 2021-01-18 NOTE — Therapy (Signed)
[place], [place], [address] Phone: [phone]   Fax:  [phone]  Physical Therapy Treatment  Patient Details  Name: [name] MRN: [id number] Date of Birth: [birth year] Referring Provider (PT): Dr. Rolm Bookbinder   Encounter Date: 01/18/2021   PT End of Session - 01/18/21 0933    Visit Number 2   # unchanged due to screen only   Number of Visits 2    PT Start Time 0928    PT Stop Time 0938    PT Time Calculation (min) 10 min    Activity Tolerance Patient tolerated treatment well    Behavior During Therapy [facility] for tasks assessed/performed           Past Medical History:  Diagnosis Date  . Allergy   . Asthma   . Breast cancer in female [facility])    Left  . Breast disorder    breast cancer May 2021  . Chronic back pain   . History of breast cancer   . Hx of migraines   . Morbid obesity ([place])     Past Surgical History:  Procedure Laterality Date  . BREAST LUMPECTOMY WITH RADIOACTIVE SEED AND SENTINEL LYMPH NODE BIOPSY Left 07/15/2020   Procedure: LEFT BREAST LUMPECTOMY WITH RADIOACTIVE SEED AND LEFT AXILLARY SENTINEL LYMPH NODE BIOPSY, LEFT AXILLARY NODE RADIOACTIVE SEED GUIDED EXCISION, LEFT BREAST RADIOACTIVE SEED GUIDED EXCISION IM NODE;  Surgeon: Rolm Bookbinder, MD;  Location: [facility];  Service: General;  Laterality: Left;  PEC BLOCK  . CESAREAN SECTION WITH BILATERAL TUBAL LIGATION Bilateral 07/23/2015   Procedure: CESAREAN SECTION WITH BILATERAL TUBAL LIGATION;  Surgeon: Donnamae Jude, MD;  Location: [facility];  Service: Obstetrics;  Laterality: Bilateral;  . PORTACATH PLACEMENT Right 02/11/2020   Procedure: INSERTION PORT-A-CATH WITH ULTRASOUND GUIDANCE;  Surgeon: Rolm Bookbinder, MD;  Location: [place];  Service: General;  Laterality: Right;  . TUBAL LIGATION      There were no vitals filed for this visit.   Subjective Assessment - 01/18/21 0931     Subjective Pt returns for 3 month L-Dex screen.    Pertinent History Patient was diagnosed on 01/23/2020 with left grade III invasive ductal carcinoma breast cancer. It is ER positive, PR negative, and HER2 negative with a Ki67 of 40%. She has 2 abnormal appearing lymph nodes and 1 was biopsied and found to be positive at time of diagnosis. Patient with interpreter present. She underwent neoadjuvant chemotherapy from 02/12/2020 - 05/28/2020. 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 She underwent a left lumpectomy and sentinel node biopsy ( 4 negative nodes) on 07/15/2020. She will undergo radiation and anti-estrogen therapy. She reports her neuropathy has resolved and has some limitation in shoulder ROM.                  L-DEX FLOWSHEETS - 01/18/21 0900      L-DEX LYMPHEDEMA SCREENING   Measurement Type Unilateral    L-DEX MEASUREMENT EXTREMITY Upper Extremity    POSITION  Standing    DOMINANT SIDE Right    At Risk Side Left    BASELINE SCORE (UNILATERAL) 3    L-DEX SCORE (UNILATERAL) 7    VALUE CHANGE (UNILAT) 4                                  PT Long Term Goals - 08/10/20  [place] #1   Title Patient will be able to demonstrate she has regained full shoulder ROM and function post operatively compared to baselines.    Time 6    Period Months    Status Achieved                 Plan - 01/18/21 0935    Clinical Impression Statement Pt returns for her 3 month L-Dex screen. Her change from baseline of 4 is WNLs so no further treatment is required at this time except to cont every 3 month L-Dex screens which pt is agreeable to.    PT Next Visit Plan Cont every 3 month L-Dex screens for up to 2 years from [facility].    Consulted and Agree with Plan of Care Patient;Other (Comment)   interpreter          Patient will benefit from skilled therapeutic intervention in order to improve the following deficits and  impairments:     Visit Diagnosis: Aftercare following surgery for neoplasm     Problem List Patient Active Problem List   Diagnosis Date Noted  . Language barrier 12/11/2020  . Morbid obesity ([place])   . History of breast cancer   . Port-A-Cath in place 03/25/2020  . Genetic testing 02/13/2020  . Malignant neoplasm of upper-outer quadrant of left breast in female, estrogen receptor positive ([place]) 01/27/2020  . Pregnancy 07/23/2015  . Impaired ability to use community resources due to language barrier 07/13/2013    Otelia Limes, PTA 01/18/2021, 9:43 AM  [place], [place], [address] Phone: [phone]   Fax:  [phone]  Name: Jisela Merlino MRN: [id number] Date of Birth: [birth year]

## 2021-01-24 NOTE — Progress Notes (Signed)
[place]  [phone] [phone]     ID: [name] DOB: [birth year]  MR#: [id number]  DJT#:[id number]  Patient Care Team: Jacelyn Pi, Lilia Argue, MD as PCP - General (Family Medicine) Rolm Bookbinder, MD as Consulting Physician (General Surgery) Toriano Aikey, Virgie Dad, MD as Consulting Physician (Oncology) Eppie Gibson, MD as Attending Physician (Radiation Oncology) Rockwell Germany, RN as Oncology Nurse Navigator Mauro Kaufmann, RN as Oncology Nurse Navigator Chauncey Cruel, MD OTHER MD:  CHIEF COMPLAINT: Triple negative breast cancer  CURRENT TREATMENT: pembrolizumab   INTERVAL HISTORY:  [name] returns today for follow up and treatment of her triple negative breast cancer.    She began pembrolizumab on 11/23/2020. She has tolerated that well, though she has complained of an itchy feeling at times. She also had a cough, felt likely to be due to reflux. A CXR 01/04/2021 showed no evidence of pneumonitis  Her most recent echocardiogram on 10/21/2020 showed an ejection fraction of 60-65%.  We are following her TSH: Lab Results  Component Value Date   TSH 1.149 01/13/2021   TSH 1.720 01/04/2021   TSH 3.924 12/14/2020   TSH 2.500 09/25/2020   TSH 2.493 07/13/2013    REVIEW OF SYSTEMS: [name] continues to have significant cough although she tells me she has been taking Prilosec every night for the last 3 weeks.  She is having so much cough that her throat is very raw.  She also has a very runny nose, which she usually gets with pollen but not lasting as long as this is lasted and of course she is also on Claritin and that has not helped.  She has not had any further reflux symptoms.  She continues to have a funny itchy feeling particularly when she is out in the sun or near heat of any kind.  This is very hard for her to describe.  Detailed review of systems today was otherwise stable   COVID 19 VACCINATION STATUS: She had the J&J  vaccine x1., infection 06/20/2020, and Pfizer booster on 08/19/2020   HISTORY OF CURRENT ILLNESS: From the original intake note:  [name] herself palpated a left beast lump with associated pain. She underwent bilateral diagnostic mammography with tomography and left breast ultrasonography at [facility] on 01/23/2020 showing: breast density category B; palpable 3.6 cm left breast mass with associated calcifications at 1 o'clock; 0.8 cm left breast mass at 2 o'clock, concerning for abnormal intramammary lymph node; at least two abnormal left axillary lymph nodes.  Accordingly on 01/23/2020 she proceeded to biopsy of the left breast area in question. The pathology from this procedure ([id number]) showed: invasive ductal carcinoma at 1 o'clock, grade 3. Prognostic indicators significant for: estrogen receptor, 20% positive with moderate staining intensity and progesterone receptor, 0% negative. Proliferation marker Ki67 at 40%. HER2 negative by immunohistochemistry (1+).  The left axillary lymph node confirmed metastatic carcinoma.  The left intramammary lymph node was negative for carcinoma.  The patient's subsequent history is as detailed below.   PAST MEDICAL HISTORY: Past Medical History:  Diagnosis Date  . Allergy   . Asthma   . Breast cancer in female [facility])    Left  . Breast disorder    breast cancer May 2021  . Chronic back pain   . History of breast cancer   . Hx of migraines   . Morbid obesity ([place])     PAST SURGICAL HISTORY: Past Surgical History:  Procedure Laterality Date  .  BREAST LUMPECTOMY WITH RADIOACTIVE SEED AND SENTINEL LYMPH NODE BIOPSY Left 07/15/2020   Procedure: LEFT BREAST LUMPECTOMY WITH RADIOACTIVE SEED AND LEFT AXILLARY SENTINEL LYMPH NODE BIOPSY, LEFT AXILLARY NODE RADIOACTIVE SEED GUIDED EXCISION, LEFT BREAST RADIOACTIVE SEED GUIDED EXCISION IM NODE;  Surgeon: Rolm Bookbinder, MD;  Location: [facility];  Service: General;  Laterality: Left;  PEC  BLOCK  . CESAREAN SECTION WITH BILATERAL TUBAL LIGATION Bilateral 07/23/2015   Procedure: CESAREAN SECTION WITH BILATERAL TUBAL LIGATION;  Surgeon: Donnamae Jude, MD;  Location: [facility];  Service: Obstetrics;  Laterality: Bilateral;  . PORTACATH PLACEMENT Right 02/11/2020   Procedure: INSERTION PORT-A-CATH WITH ULTRASOUND GUIDANCE;  Surgeon: Rolm Bookbinder, MD;  Location: [facility];  Service: General;  Laterality: Right;  . TUBAL LIGATION      FAMILY HISTORY: Family History  Problem Relation Age of Onset  . Heart disease Maternal Grandmother    Her parents are both living as of 01/2020, her father age 38 and her mother age 69. She has three maternal half-siblings (1 sister, 2 brothers) and four paternal half-siblings (1 sister, 3 brothers). There is no family history of cancer to her knowledge.   GYNECOLOGIC HISTORY:  Patient's last menstrual period was 01/20/2020 (exact date). Menarche: 45 years old Age at first live birth: 45 years old Amelia P 3 LMP 01/2020 Contraceptive s/p tubal ligation HRT n/a  Hysterectomy? no BSO? 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 Husband Vaughan Sine works for a Financial trader, both in [employer]. They are both from [place]. She lives at home with [place] and their three children-- [name], [place], and [name]. She attends an [employer].    ADVANCED DIRECTIVES: In the absence of any documentation to the contrary, the patient's spouse is their HCPOA.    HEALTH MAINTENANCE: Social History   Tobacco Use  . Smoking status: Never Smoker  . Smokeless tobacco: Never Used  Vaping Use  . Vaping Use: Never used  Substance Use Topics  . Alcohol use: No  . Drug use: No     Colonoscopy: n/a (age)  PAP: 12/2020  Bone density: n/a (age)   Allergies  Allergen Reactions  . Doxorubicin Hcl Rash    Experience chest tightness,  abdominal pain, and redness around lips during and shortly after doxorubicin bolus.     Current Outpatient Medications  Medication Sig Dispense Refill  . albuterol (VENTOLIN HFA) 108 (90 Base) MCG/ACT inhaler Inhale 1-2 puffs into the lungs every 4 (four) hours as needed for wheezing or shortness of breath. 8 g 5  . benzonatate (TESSALON) 200 MG capsule Take 1 capsule (200 mg total) by mouth 3 (three) times daily as needed for cough. 20 capsule 0  . fluticasone (FLOVENT HFA) 110 MCG/ACT inhaler Inhale 1 puff into the lungs in the morning and at bedtime. 1 each 2  . guaiFENesin-codeine 100-10 MG/5ML syrup Take 5 mLs by mouth every 4 (four) hours as needed for cough. Do not drive while taking this medication. 120 mL 0  . lidocaine-prilocaine (EMLA) cream APPLY  CREAM TOPICALLY TO AFFECTED AREA ONCE (Patient taking differently: APPLY  CREAM TOPICALLY TO AFFECTED AREA ONCE) 30 g 3  . loratadine (CLARITIN) 10 MG tablet Take 1 tablet (10 mg total) by mouth daily. 90 tablet 4  . omeprazole (PRILOSEC) 40 MG capsule Take 1 capsule (40 mg total) by mouth at bedtime. 30 capsule 0   No current  facility-administered medications for this visit.    OBJECTIVE: Spanish speaker who appears well  Vitals:   01/25/21 1058  BP: 102/77  Pulse: 88  Resp: 18  Temp: (!) 97.4 F (36.3 C)  SpO2: 100%     Body mass index is 36.31 kg/m.   Wt Readings from Last 3 Encounters:  01/25/21 198 lb 8 oz (90 kg)  01/13/21 196 lb 4.8 oz (89 kg)  01/04/21 202 lb (91.6 kg)      ECOG FS:1 - Symptomatic but completely ambulatory  Sclerae unicteric, EOMs intact Minimal cough during exam No cervical or supraclavicular adenopathy Lungs no rales or rhonchi Heart regular rate and rhythm Abd soft, nontender, positive bowel sounds MSK no focal spinal tenderness, no upper extremity lymphedema Neuro: nonfocal, well oriented, appropriate affect Breasts: The right breast is benign.  The left breast is status postlumpectomy and  radiation.  The cosmetic result is very good.  The hyperpigmentation is fading.   LAB RESULTS:  CMP     Component Value Date/Time   NA 140 01/13/2021 1305   NA 138 08/15/2017 1525   K 3.9 01/13/2021 1305   CL 107 01/13/2021 1305   CO2 26 01/13/2021 1305   GLUCOSE 113 (H) 01/13/2021 1305   BUN 13 01/13/2021 1305   BUN 12 08/15/2017 1525   CREATININE 0.78 01/13/2021 1305   CALCIUM 9.5 01/13/2021 1305   PROT 7.3 01/13/2021 1305   PROT 7.4 01/03/2017 1642   ALBUMIN 3.9 01/13/2021 1305   ALBUMIN 4.3 01/03/2017 1642   AST 24 01/13/2021 1305   ALT 24 01/13/2021 1305   ALKPHOS 100 01/13/2021 1305   BILITOT 0.4 01/13/2021 1305   GFRNONAA >60 01/13/2021 1305   GFRAA >60 06/03/2020 0949   GFRAA >60 02/26/2020 0915    No results found for: TOTALPROTELP, ALBUMINELP, A1GS, A2GS, BETS, BETA2SER, GAMS, MSPIKE, SPEI  Lab Results  Component Value Date   WBC 2.7 (L) 01/25/2021   NEUTROABS 1.3 (L) 01/25/2021   HGB 11.9 (L) 01/25/2021   HCT 36.2 01/25/2021   MCV 84.8 01/25/2021   PLT 174 01/25/2021    No results found for: LABCA2  No components found for: LGXQJJ941  No results for input(s): INR in the last 168 hours.  No results found for: LABCA2  No results found for: DEY814  No results found for: GYJ856  No results found for: DJS970  No results found for: CA2729  No components found for: HGQUANT  No results found for: CEA1 / No results found for: CEA1   No results found for: AFPTUMOR  No results found for: CHROMOGRNA  No results found for: KPAFRELGTCHN, LAMBDASER, KAPLAMBRATIO (kappa/lambda light chains)  No results found for: HGBA, HGBA2QUANT, HGBFQUANT, HGBSQUAN (Hemoglobinopathy evaluation)   No results found for: LDH  No results found for: IRON, TIBC, IRONPCTSAT (Iron and TIBC)  No results found for: FERRITIN  Urinalysis No results found for: COLORURINE, APPEARANCEUR, LABSPEC, PHURINE, GLUCOSEU, HGBUR, BILIRUBINUR, KETONESUR, PROTEINUR, UROBILINOGEN,  NITRITE, LEUKOCYTESUR   STUDIES: DG Chest 2 View  Result Date: 01/04/2021 CLINICAL DATA:  Cough and wheezing.  History of breast cancer. EXAM: CHEST - 2 VIEW COMPARISON:  03/19/2020 FINDINGS: A right jugular Port-A-Cath terminates over the lower SVC, unchanged. The cardiomediastinal silhouette is unchanged with normal heart size. The lungs are well inflated. No airspace consolidation, edema, pleural effusion, pneumothorax is identified. Surgical clips project in the left breast. No acute osseous abnormality is seen. IMPRESSION: No active cardiopulmonary disease. Electronically Signed   By: Zenia Resides  Jeralyn Ruths M.D.   On: 01/04/2021 13:27     ELIGIBLE FOR AVAILABLE RESEARCH PROTOCOL: [id number]?  ASSESSMENT: 45 y.o. [place] woman status post left breast upper outer quadrant biopsy 01/23/2020 for a clinical T3 N1, 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  (a) chest CT scan and bone scan 02/07/2020 showed no evidence of metastatic disease  (1) neoadjuvant chemotherapy consisting of doxorubicin and cyclophosphamide in dose dense fashion x4 started 02/12/2020, completed 03/25/2020, followed by paclitaxel and carboplatin weekly x12 started 04/08/2020 and completed on 05/28/2020  (a) echo 02/07/2020 shows an ejection fraction in the 55-60% range  (b) Epirubicin substituted for Doxorubicin starting with cycle 2 of AC secondary to allergic rash from Doxorubicin  (2) status post left lumpectomy and sentinel lymph node sampling 07/15/2020 for a ypT1b ypN0 residual invasive ductal carcinoma, grade 3, with negative margins.  (a) a total of 4 left axillary lymph nodes were removed  (b) repeat prognostic panel on the final pathology again triple negative, with an MIB-1 of 50%.  (3) adjuvant radiation completed 10/09/2020  (a) sensitizing capecitabine attempted but not tolerated by the patient  (4) genetics testing 02/07/2020 through the [facility] Common  Hereditary Cancers Panel found no deleterious mutations in  APC, ATM, AXIN2, BARD1, BMPR1A, BRCA1, BRCA2, BRIP1, CDH1, CDKN2A (p14ARF), CDKN2A (p16INK4a), CKD4, CHEK2, CTNNA1, DICER1, EPCAM (Deletion/duplication testing only), GREM1 (promoter region deletion/duplication testing only), KIT, MEN1, MLH1, MSH2, MSH3, MSH6, MUTYH, NBN, NF1, NHTL1, PALB2, PDGFRA, PMS2, POLD1, POLE, PTEN, RAD50, RAD51C, RAD51D, RNF43, SDHB, SDHC, SDHD, SMAD4, SMARCA4. STK11, TP53, TSC1, TSC2, and VHL.  The following genes were evaluated for sequence changes only: SDHA and HOXB13 c.251G>A variant only.  (a) VUS in MSH6 called c.2744C>G identified.   (5)  adjuvant pembrolizumab started 11/23/2020, to continue for 1 year   PLAN: Jessa continues to have significant cough problems.  This is affecting her sleep and her daytime activities.  It is making her throat very raw.  She has other symptoms which also might be related namely the runny nose and a strange itchy fatigue feeling which she gets with heat.  There was no improvement with Prilosec after 3 weeks so I do not think this is going to be reflux.  She is on Claritin so she should have improved if this were a simple allergy.  I have to begin to believe that this may be due to the pembrolizumab.  We are not going to treat her today.  We will have a virtual visit on June 1 to decide whether or not she will be treated on June 6.  I will see her on June 27 and at that point we will make a definitive decision whether or not to continue the pembrolizumab.  In the meantime I am putting her on a Medrol Dosepak.  She will call with any other issues that may develop before the next visit.  Total encounter time 25 minutes.Sarajane Jews C. Bocephus Cali, MD 01/25/21 11:06 AM Medical Oncology and Hematology [facility] [place], [place] [address] Tel. [phone]    Fax. [phone]   I, Wilburn Mylar, am acting as scribe for Dr. Virgie Dad.  Katherene Dinino.  I, Lurline Del MD, have reviewed the above documentation for accuracy and completeness, and I agree with the above.   *Total Encounter Time as defined by the Centers for Medicare and Medicaid Services includes, in addition to the face-to-face time of a patient visit (documented in  the note above) non-face-to-face time: obtaining and reviewing outside history, ordering and reviewing medications, tests or procedures, care coordination (communications with other health care professionals or caregivers) and documentation in the medical record.

## 2021-01-25 ENCOUNTER — Inpatient Hospital Stay: Payer: Self-pay

## 2021-01-25 ENCOUNTER — Other Ambulatory Visit: Payer: Self-pay

## 2021-01-25 ENCOUNTER — Inpatient Hospital Stay (HOSPITAL_BASED_OUTPATIENT_CLINIC_OR_DEPARTMENT_OTHER): Payer: Self-pay | Admitting: Oncology

## 2021-01-25 VITALS — BP 102/77 | HR 88 | Temp 97.4°F | Resp 18 | Ht 62.0 in | Wt 198.5 lb

## 2021-01-25 DIAGNOSIS — Z95828 Presence of other vascular implants and grafts: Secondary | ICD-10-CM

## 2021-01-25 DIAGNOSIS — Z170 Estrogen receptor positive status [ER+]: Secondary | ICD-10-CM

## 2021-01-25 DIAGNOSIS — C50412 Malignant neoplasm of upper-outer quadrant of left female breast: Secondary | ICD-10-CM

## 2021-01-25 LAB — CBC WITH DIFFERENTIAL/PLATELET
Abs Immature Granulocytes: 0.02 10*3/uL (ref 0.00–0.07)
Basophils Absolute: 0 10*3/uL (ref 0.0–0.1)
Basophils Relative: 0 %
Eosinophils Absolute: 0.1 10*3/uL (ref 0.0–0.5)
Eosinophils Relative: 5 %
HCT: 36.2 % (ref 36.0–46.0)
Hemoglobin: 11.9 g/dL — ABNORMAL LOW (ref 12.0–15.0)
Immature Granulocytes: 1 %
Lymphocytes Relative: 37 %
Lymphs Abs: 1 10*3/uL (ref 0.7–4.0)
MCH: 27.9 pg (ref 26.0–34.0)
MCHC: 32.9 g/dL (ref 30.0–36.0)
MCV: 84.8 fL (ref 80.0–100.0)
Monocytes Absolute: 0.2 10*3/uL (ref 0.1–1.0)
Monocytes Relative: 8 %
Neutro Abs: 1.3 10*3/uL — ABNORMAL LOW (ref 1.7–7.7)
Neutrophils Relative %: 49 %
Platelets: 174 10*3/uL (ref 150–400)
RBC: 4.27 MIL/uL (ref 3.87–5.11)
RDW: 13.7 % (ref 11.5–15.5)
WBC: 2.7 10*3/uL — ABNORMAL LOW (ref 4.0–10.5)
nRBC: 0 % (ref 0.0–0.2)

## 2021-01-25 LAB — COMPREHENSIVE METABOLIC PANEL
ALT: 23 U/L (ref 0–44)
AST: 24 U/L (ref 15–41)
Albumin: 3.5 g/dL (ref 3.5–5.0)
Alkaline Phosphatase: 100 U/L (ref 38–126)
Anion gap: 6 (ref 5–15)
BUN: 10 mg/dL (ref 6–20)
CO2: 27 mmol/L (ref 22–32)
Calcium: 9.4 mg/dL (ref 8.9–10.3)
Chloride: 107 mmol/L (ref 98–111)
Creatinine, Ser: 0.7 mg/dL (ref 0.44–1.00)
GFR, Estimated: >60 mL/min (ref >60)
Glucose, Bld: 110 mg/dL — ABNORMAL HIGH (ref 70–99)
Potassium: 3.8 mmol/L (ref 3.5–5.1)
Sodium: 140 mmol/L (ref 135–145)
Total Bilirubin: 0.3 mg/dL (ref 0.3–1.2)
Total Protein: 6.7 g/dL (ref 6.5–8.1)

## 2021-01-25 LAB — TSH: TSH: 0.644 u[IU]/mL (ref 0.308–3.960)

## 2021-01-25 MED ORDER — PREDNISONE 5 MG PO TABS: ORAL_TABLET | ORAL | 0 refills | Status: DC

## 2021-01-25 MED ORDER — SODIUM CHLORIDE 0.9% FLUSH
10.0000 mL | Freq: Once | INTRAVENOUS | Status: AC
  Administered 2021-01-25: 10 mL
  Filled 2021-01-25: qty 10

## 2021-01-27 ENCOUNTER — Telehealth: Payer: Self-pay | Admitting: Oncology

## 2021-01-27 NOTE — Telephone Encounter (Signed)
Scheduled appointment per 05/16 los. Patient is aware.  

## 2021-01-28 ENCOUNTER — Other Ambulatory Visit: Payer: Self-pay

## 2021-01-28 DIAGNOSIS — Z170 Estrogen receptor positive status [ER+]: Secondary | ICD-10-CM

## 2021-02-10 ENCOUNTER — Inpatient Hospital Stay: Payer: Self-pay | Attending: Oncology | Admitting: Oncology

## 2021-02-10 ENCOUNTER — Telehealth: Payer: Self-pay | Admitting: Oncology

## 2021-02-10 DIAGNOSIS — R21 Rash and other nonspecific skin eruption: Secondary | ICD-10-CM | POA: Insufficient documentation

## 2021-02-10 DIAGNOSIS — Z171 Estrogen receptor negative status [ER-]: Secondary | ICD-10-CM | POA: Insufficient documentation

## 2021-02-10 DIAGNOSIS — Z9221 Personal history of antineoplastic chemotherapy: Secondary | ICD-10-CM | POA: Insufficient documentation

## 2021-02-10 DIAGNOSIS — C773 Secondary and unspecified malignant neoplasm of axilla and upper limb lymph nodes: Secondary | ICD-10-CM | POA: Insufficient documentation

## 2021-02-10 DIAGNOSIS — Z170 Estrogen receptor positive status [ER+]: Secondary | ICD-10-CM

## 2021-02-10 DIAGNOSIS — Z7952 Long term (current) use of systemic steroids: Secondary | ICD-10-CM | POA: Insufficient documentation

## 2021-02-10 DIAGNOSIS — C50412 Malignant neoplasm of upper-outer quadrant of left female breast: Secondary | ICD-10-CM | POA: Insufficient documentation

## 2021-02-10 DIAGNOSIS — Z923 Personal history of irradiation: Secondary | ICD-10-CM | POA: Insufficient documentation

## 2021-02-10 NOTE — Telephone Encounter (Signed)
Cancelled appointment per 06/01 los Left a detailed message.

## 2021-02-10 NOTE — Progress Notes (Signed)
[place]  [phone] [phone]     ID: Ladrea Holladay DOB: 06-23-1976  MR#: [id number]  LFY#:[id number]  Patient Care Team: Jacelyn Pi, Lilia Argue, MD as PCP - General (Family Medicine) Rolm Bookbinder, MD as Consulting Physician (General Surgery) Pansey Pinheiro, Virgie Dad, MD as Consulting Physician (Oncology) Eppie Gibson, MD as Attending Physician (Radiation Oncology) Rockwell Germany, RN as Oncology Nurse Navigator Mauro Kaufmann, RN as Oncology Nurse Navigator Chauncey Cruel, MD OTHER MD:  I connected with [name] on 02/10/21 at  9:30 AM EDT by telephone visit and verified that I am speaking with the correct person using two identifiers.   I discussed the limitations, risks, security and privacy concerns of performing an evaluation and management service by telemedicine and the availability of in-person appointments. I also discussed with the patient that there may be a patient responsible charge related to this service. The patient expressed understanding and agreed to proceed.   Other persons participating in the visit and their role in the encounter: None  Patient's location: Home Provider's location: [place]  Total time spent: 15 min   CHIEF COMPLAINT: Triple negative breast cancer  CURRENT TREATMENT: pembrolizumab   INTERVAL HISTORY:  [name] was contacted today for follow up of her triple negative breast cancer.    She began pembrolizumab on 11/23/2020. She has tolerated that well, though she has complained of an itchy feeling at times. She also had a cough, felt likely to be due to reflux. A CXR 01/04/2021 showed no evidence of pneumonitis  Her most recent echocardiogram on 10/21/2020 showed an ejection fraction of 60-65%.  She is scheduled for annual mammography on 02/23/2021.  We are following her TSH: Lab Results  Component Value Date   TSH 0.644 01/25/2021   TSH 1.149 01/13/2021    TSH 1.720 01/04/2021   TSH 3.924 12/14/2020   TSH 2.500 09/25/2020    REVIEW OF SYSTEMS: [name] tells me she is still having a little bit of a cough but it is considerably better.  Sometimes it "grabs her chest", particularly at night, but there is no chest pressure as such and no phlegm no fever and no pleurisy.  Overall much better but not completely resolved.  The itching is also pretty much gone   COVID 19 VACCINATION STATUS: She had the J&J vaccine x1., infection 06/20/2020, and Pfizer booster on 08/19/2020   HISTORY OF CURRENT ILLNESS: From the original intake note:  [name] herself palpated a left beast lump with associated pain. She underwent bilateral diagnostic mammography with tomography and left breast ultrasonography at [facility] on 01/23/2020 showing: breast density category B; palpable 3.6 cm left breast mass with associated calcifications at 1 o'clock; 0.8 cm left breast mass at 2 o'clock, concerning for abnormal intramammary lymph node; at least two abnormal left axillary lymph nodes.  Accordingly on 01/23/2020 she proceeded to biopsy of the left breast area in question. The pathology from this procedure ([id number]) showed: invasive ductal carcinoma at 1 o'clock, grade 3. Prognostic indicators significant for: estrogen receptor, 20% positive with moderate staining intensity and progesterone receptor, 0% negative. Proliferation marker Ki67 at 40%. HER2 negative by immunohistochemistry (1+).  The left axillary lymph node confirmed metastatic carcinoma.  The left intramammary lymph node was negative for carcinoma.  The patient's subsequent history is as detailed below.   PAST MEDICAL HISTORY: Past Medical History:  Diagnosis Date  . Allergy   . Asthma   .  Breast cancer in female [name])    Left  . Breast disorder    breast cancer May 2021  . Chronic back pain   . History of breast cancer   . Hx of migraines   . Morbid obesity ([place])     PAST SURGICAL  HISTORY: Past Surgical History:  Procedure Laterality Date  . BREAST LUMPECTOMY WITH RADIOACTIVE SEED AND SENTINEL LYMPH NODE BIOPSY Left 07/15/2020   Procedure: LEFT BREAST LUMPECTOMY WITH RADIOACTIVE SEED AND LEFT AXILLARY SENTINEL LYMPH NODE BIOPSY, LEFT AXILLARY NODE RADIOACTIVE SEED GUIDED EXCISION, LEFT BREAST RADIOACTIVE SEED GUIDED EXCISION IM NODE;  Surgeon: Rolm Bookbinder, MD;  Location: [facility];  Service: General;  Laterality: Left;  PEC BLOCK  . CESAREAN SECTION WITH BILATERAL TUBAL LIGATION Bilateral 07/23/2015   Procedure: CESAREAN SECTION WITH BILATERAL TUBAL LIGATION;  Surgeon: Donnamae Jude, MD;  Location: [facility];  Service: Obstetrics;  Laterality: Bilateral;  . PORTACATH PLACEMENT Right 02/11/2020   Procedure: INSERTION PORT-A-CATH WITH ULTRASOUND GUIDANCE;  Surgeon: Rolm Bookbinder, MD;  Location: [facility];  Service: General;  Laterality: Right;  . TUBAL LIGATION      FAMILY HISTORY: Family History  Problem Relation Age of Onset  . Heart disease Maternal Grandmother    Her parents are both living as of 01/2020, her father age 9 and her mother age 1. She has three maternal half-siblings (1 sister, 2 brothers) and four paternal half-siblings (1 sister, 3 brothers). There is no family history of cancer to her knowledge.   GYNECOLOGIC HISTORY:  Patient's last menstrual period was 01/20/2020 (exact date). Menarche: 45 years old Age at first live birth: 45 years old [place] P 3 LMP 01/2020 Contraceptive s/p tubal ligation HRT n/a  Hysterectomy? no BSO? 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 Husband [name] works for a Financial trader, both in [employer]. They are both from [place]. She lives at home with [place] and their three children-- [name], [name], and [name]. She attends an [employer].    ADVANCED DIRECTIVES: In the absence of any  documentation to the contrary, the patient's spouse is their HCPOA.    HEALTH MAINTENANCE: Social History   Tobacco Use  . Smoking status: Never Smoker  . Smokeless tobacco: Never Used  Vaping Use  . Vaping Use: Never used  Substance Use Topics  . Alcohol use: No  . Drug use: No     Colonoscopy: n/a (age)  PAP: 12/2020  Bone density: n/a (age)   Allergies  Allergen Reactions  . Doxorubicin Hcl Rash    Experience chest tightness, abdominal pain, and redness around lips during and shortly after doxorubicin bolus.     Current Outpatient Medications  Medication Sig Dispense Refill  . albuterol (VENTOLIN HFA) 108 (90 Base) MCG/ACT inhaler Inhale 1-2 puffs into the lungs every 4 (four) hours as needed for wheezing or shortness of breath. 8 g 5  . lidocaine-prilocaine (EMLA) cream APPLY  CREAM TOPICALLY TO AFFECTED AREA ONCE (Patient taking differently: APPLY  CREAM TOPICALLY TO AFFECTED AREA ONCE) 30 g 3  . loratadine (CLARITIN) 10 MG tablet Take 1 tablet (10 mg total) by mouth daily. 90 tablet 4  . omeprazole (PRILOSEC) 40 MG capsule Take 1 capsule (40 mg total) by mouth at bedtime. 30 capsule 0  . predniSONE (DELTASONE) 5 MG tablet Take 6 tablets day 1, 5 tablets day 2, 4 tablets day 3, 3 tablets  day 4, 2 tablets day 5 and ine tablet day 6 21 tablet 0   No current facility-administered medications for this visit.    OBJECTIVE: Spanish speaker who appears well  There were no vitals filed for this visit.   There is no height or weight on file to calculate BMI.   Wt Readings from Last 3 Encounters:  01/25/21 198 lb 8 oz (90 kg)  01/13/21 196 lb 4.8 oz (89 kg)  01/04/21 202 lb (91.6 kg)      ECOG FS:1 - Symptomatic but completely ambulatory  Telemedicine visit 02/10/2021  LAB RESULTS:  CMP     Component Value Date/Time   NA 140 01/25/2021 1042   NA 138 08/15/2017 1525   K 3.8 01/25/2021 1042   CL 107 01/25/2021 1042   CO2 27 01/25/2021 1042   GLUCOSE 110 (H)  01/25/2021 1042   BUN 10 01/25/2021 1042   BUN 12 08/15/2017 1525   CREATININE 0.70 01/25/2021 1042   CREATININE 0.78 01/13/2021 1305   CALCIUM 9.4 01/25/2021 1042   PROT 6.7 01/25/2021 1042   PROT 7.4 01/03/2017 1642   ALBUMIN 3.5 01/25/2021 1042   ALBUMIN 4.3 01/03/2017 1642   AST 24 01/25/2021 1042   AST 24 01/13/2021 1305   ALT 23 01/25/2021 1042   ALT 24 01/13/2021 1305   ALKPHOS 100 01/25/2021 1042   BILITOT 0.3 01/25/2021 1042   BILITOT 0.4 01/13/2021 1305   GFRNONAA >60 01/25/2021 1042   GFRNONAA >60 01/13/2021 1305   GFRAA >60 06/03/2020 0949   GFRAA >60 02/26/2020 0915    No results found for: TOTALPROTELP, ALBUMINELP, A1GS, A2GS, BETS, BETA2SER, GAMS, MSPIKE, SPEI  Lab Results  Component Value Date   WBC 2.7 (L) 01/25/2021   NEUTROABS 1.3 (L) 01/25/2021   HGB 11.9 (L) 01/25/2021   HCT 36.2 01/25/2021   MCV 84.8 01/25/2021   PLT 174 01/25/2021    No results found for: LABCA2  No components found for: [id number]  No results for input(s): INR in the last 168 hours.  No results found for: LABCA2  No results found for: ZHG992  No results found for: EQA834  No results found for: HDQ222  No results found for: CA2729  No components found for: HGQUANT  No results found for: CEA1 / No results found for: CEA1   No results found for: AFPTUMOR  No results found for: CHROMOGRNA  No results found for: KPAFRELGTCHN, LAMBDASER, KAPLAMBRATIO (kappa/lambda light chains)  No results found for: HGBA, HGBA2QUANT, HGBFQUANT, HGBSQUAN (Hemoglobinopathy evaluation)   No results found for: LDH  No results found for: IRON, TIBC, IRONPCTSAT (Iron and TIBC)  No results found for: FERRITIN  Urinalysis No results found for: COLORURINE, APPEARANCEUR, LABSPEC, PHURINE, GLUCOSEU, HGBUR, BILIRUBINUR, KETONESUR, PROTEINUR, UROBILINOGEN, NITRITE, LEUKOCYTESUR   STUDIES: No results found.   ELIGIBLE FOR AVAILABLE RESEARCH PROTOCOL: [phone]?  ASSESSMENT: 45 y.o.  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  (a) chest CT scan and bone scan 02/07/2020 showed no evidence of metastatic disease  (1) neoadjuvant chemotherapy consisting of doxorubicin and cyclophosphamide in dose dense fashion x4 started 02/12/2020, completed 03/25/2020, followed by paclitaxel and carboplatin weekly x12 started 04/08/2020 and completed on 05/28/2020  (a) echo 02/07/2020 shows an ejection fraction in the 55-60% range  (b) Epirubicin substituted for Doxorubicin starting with cycle 2 of AC secondary to allergic rash  from Doxorubicin  (2) status post left lumpectomy and sentinel lymph node sampling 07/15/2020 for a ypT1b ypN0 residual invasive ductal carcinoma, grade 3, with negative margins.  (a) a total of 4 left axillary lymph nodes were removed  (b) repeat prognostic panel on the final pathology again triple negative, with an MIB-1 of 50%.  (3) adjuvant radiation completed 10/09/2020  (a) sensitizing capecitabine attempted but not tolerated by the patient  (4) genetics testing 02/07/2020 through the [facility] Common Hereditary Cancers Panel found no deleterious mutations in  APC, ATM, AXIN2, BARD1, BMPR1A, BRCA1, BRCA2, BRIP1, CDH1, CDKN2A (p14ARF), CDKN2A (p16INK4a), CKD4, CHEK2, CTNNA1, DICER1, EPCAM (Deletion/duplication testing only), GREM1 (promoter region deletion/duplication testing only), KIT, MEN1, MLH1, MSH2, MSH3, MSH6, MUTYH, NBN, NF1, NHTL1, PALB2, PDGFRA, PMS2, POLD1, POLE, PTEN, RAD50, RAD51C, RAD51D, RNF43, SDHB, SDHC, SDHD, SMAD4, SMARCA4. STK11, TP53, TSC1, TSC2, and VHL.  The following genes were evaluated for sequence changes only: SDHA and HOXB13 c.251G>A variant only.  (a) VUS in MSH6 called c.2744C>G identified.   (5)  adjuvant pembrolizumab started 11/23/2020, to continue for 1 year   PLAN: Kemper's cough  is improved but not completely resolved.  I am going to cancel her treatment 02/15/2021 and give her little bit more time to recover.  She will see me 03/08/2021 and if everything is back to normal then we will resume the pembrolizumab at that point.  She knows to call for any other issue that may develop before then    Sarajane Jews C. Larosa Rhines, MD 02/10/21 9:56 AM Medical Oncology and Hematology [facility] [address], [place] [address] Tel. [phone]    Fax. [phone]   I, Wilburn Mylar, am acting as scribe for Dr. Virgie Dad. Anaija Wissink.  I, Lurline Del MD, have reviewed the above documentation for accuracy and completeness, and I agree with the above.   *Total Encounter Time as defined by the Centers for Medicare and Medicaid Services includes, in addition to the face-to-face time of a patient visit (documented in the note above) non-face-to-face time: obtaining and reviewing outside history, ordering and reviewing medications, tests or procedures, care coordination (communications with other health care professionals or caregivers) and documentation in the medical record.

## 2021-02-15 ENCOUNTER — Inpatient Hospital Stay: Payer: Self-pay

## 2021-02-15 ENCOUNTER — Other Ambulatory Visit: Payer: Self-pay

## 2021-02-23 ENCOUNTER — Encounter: Payer: Self-pay | Admitting: Oncology

## 2021-02-23 ENCOUNTER — Other Ambulatory Visit: Payer: Self-pay

## 2021-02-23 ENCOUNTER — Ambulatory Visit
Admission: RE | Admit: 2021-02-23 | Discharge: 2021-02-23 | Disposition: A | Discharge status: Home / Self Care | Payer: No Typology Code available for payment source | Source: Ambulatory Visit | Attending: Obstetrics and Gynecology | Admitting: Obstetrics and Gynecology

## 2021-02-23 ENCOUNTER — Ambulatory Visit: Payer: No Typology Code available for payment source | Admitting: *Deleted

## 2021-02-23 VITALS — BP 122/78 | Wt 194.4 lb

## 2021-02-23 DIAGNOSIS — C50412 Malignant neoplasm of upper-outer quadrant of left female breast: Secondary | ICD-10-CM

## 2021-02-23 DIAGNOSIS — Z1239 Encounter for other screening for malignant neoplasm of breast: Secondary | ICD-10-CM

## 2021-02-23 DIAGNOSIS — N6321 Unspecified lump in the left breast, upper outer quadrant: Secondary | ICD-10-CM

## 2021-02-23 NOTE — Progress Notes (Signed)
[name] is a 45 y.o. female who presents to [facility] clinic today with complaint of left breast pain and lump versus firm area at lumpectomy scars left breast. Patient states the pain is a sharp pain that comes and goes. Patient rates the pain at a 5-6 out of 10.    Pap Smear: Pap smear not completed today. Last Pap smear was 12/11/2020 at [facility] for [name] clinic and was normal with negative HPV. Per patient has no history of an abnormal Pap smear. Last Pap smear result is available in Epic.   Physical exam: Breasts Right breast slightly larger than left breast that per patient is normal for her. No skin abnormalities right breast. Scar observed left breast at lumpectomy site between 12 o'clock - 2 o'clock and at axilla between 1 o'clock - 3 o'clock. Radiation skin changes observed to left breast due to history of radiation to left breast for breast cancer. No nipple retraction bilateral breasts. No nipple discharge bilateral breasts. No lymphadenopathy. No lumps palpated right breast. Palpated a lump versus firm area at lumpectomy scars at 2 o'clock 11 cm from the nipple and and 1 o'clock 3 cm from the nipple. No complaints of pain or tenderness on exam.    MM Breast Surgical Specimen  Result Date: 07/15/2020 CLINICAL DATA:  Left lumpectomy for invasive ductal carcinoma in the 1 o'clock position of the left breast marked with a ribbon shaped clip and surgical excision of a previously biopsied metastatic left axillary lymph node marked with a Q shaped clip and a previously biopsied intramammary lymph node in the 2 o'clock position of the breast marked with a coil shaped clip that was negative for metastatic disease. EXAM: SPECIMEN RADIOGRAPH OF THE LEFT BREAST X 2 SPECIMEN RADIOGRAPH OF THE LEFT AXILLA COMPARISON:  Previous exam(s). FINDINGS: Status post excision of the left breast and left axilla. The ribbon shaped biopsy marker clip, radioactive seed and small  calcifications within an ill-defined irregular mass are present in one breast specimen, completely intact, and were marked for pathology. The coil shaped biopsy marker clip, radioactive seed and a small oval mass are present in the other breast specimen, completely intact, and marked for pathology. The Q shaped biopsy marker clip within an oval mass are present in the axillary specimen, completely intact and more marked for pathology. The left axillary radioactive seed is in the specimen container separate from the specimen. This is also completely intact. IMPRESSION: Specimen radiographs of the left breast and left axilla. Electronically Signed   By: Claudie Revering M.D.   On: 07/15/2020 16:57   MM Breast Surgical Specimen  Result Date: 07/15/2020 CLINICAL DATA:  Left lumpectomy for invasive ductal carcinoma in the 1 o'clock position of the left breast marked with a ribbon shaped clip and surgical excision of a previously biopsied metastatic left axillary lymph node marked with a Q shaped clip and a previously biopsied intramammary lymph node in the 2 o'clock position of the breast marked with a coil shaped clip that was negative for metastatic disease. EXAM: SPECIMEN RADIOGRAPH OF THE LEFT BREAST X 2 SPECIMEN RADIOGRAPH OF THE LEFT AXILLA COMPARISON:  Previous exam(s). FINDINGS: Status post excision of the left breast and left axilla. The ribbon shaped biopsy marker clip, radioactive seed and small calcifications within an ill-defined irregular mass are present in one breast specimen, completely intact, and were marked for pathology. The coil shaped biopsy marker clip, radioactive seed and a small oval mass are  present in the other breast specimen, completely intact, and marked for pathology. The Q shaped biopsy marker clip within an oval mass are present in the axillary specimen, completely intact and more marked for pathology. The left axillary radioactive seed is in the specimen container separate from the  specimen. This is also completely intact. IMPRESSION: Specimen radiographs of the left breast and left axilla. Electronically Signed   By: Claudie Revering M.D.   On: 07/15/2020 16:57   MM Breast Surgical Specimen  Result Date: 07/15/2020 CLINICAL DATA:  Left lumpectomy for invasive ductal carcinoma in the 1 o'clock position of the left breast marked with a ribbon shaped clip and surgical excision of a previously biopsied metastatic left axillary lymph node marked with a Q shaped clip and a previously biopsied intramammary lymph node in the 2 o'clock position of the breast marked with a coil shaped clip that was negative for metastatic disease. EXAM: SPECIMEN RADIOGRAPH OF THE LEFT BREAST X 2 SPECIMEN RADIOGRAPH OF THE LEFT AXILLA COMPARISON:  Previous exam(s). FINDINGS: Status post excision of the left breast and left axilla. The ribbon shaped biopsy marker clip, radioactive seed and small calcifications within an ill-defined irregular mass are present in one breast specimen, completely intact, and were marked for pathology. The coil shaped biopsy marker clip, radioactive seed and a small oval mass are present in the other breast specimen, completely intact, and marked for pathology. The Q shaped biopsy marker clip within an oval mass are present in the axillary specimen, completely intact and more marked for pathology. The left axillary radioactive seed is in the specimen container separate from the specimen. This is also completely intact. IMPRESSION: Specimen radiographs of the left breast and left axilla. Electronically Signed   By: Claudie Revering M.D.   On: 07/15/2020 16:57   MM LT RADIOACTIVE SEED LOC MAMMO GUIDE  Result Date: 07/14/2020 CLINICAL DATA:  45 year old female presenting for radioactive seed localization of a left breast mass prior to lumpectomy. EXAM: MAMMOGRAPHIC GUIDED RADIOACTIVE SEED LOCALIZATION OF THE LEFT BREAST COMPARISON:  Previous exam(s). FINDINGS: Patient presents for radioactive  seed localization prior to left breast lumpectomy. I met with the patient and we discussed the procedure of seed localization including benefits and alternatives. We discussed the high likelihood of a successful procedure. We discussed the risks of the procedure including infection, bleeding, tissue injury and further surgery. We discussed the low dose of radioactivity involved in the procedure. Informed, written consent was given. The usual time-out protocol was performed immediately prior to the procedure. Using mammographic guidance, sterile technique, 1% lidocaine and an I-125 radioactive seed, the ribbon shaped biopsy marking clip at the site of the known malignancy in the upper-outer quadrant of the left breast was localized using a lateral approach. The follow-up mammogram images confirm the seed in the expected location and were marked for Dr. Donne Hazel. Follow-up survey of the patient confirms presence of the radioactive seed. Order number of I-125 seed:  [id number]. Total activity:  7.510 millicuries reference Date: 06/16/2020 The patient tolerated the procedure well and was released from the [facility]. She was given instructions regarding seed removal. IMPRESSION: Radioactive seed localization left breast. No apparent complications. Electronically Signed   By: Ammie Ferrier M.D.   On: 07/14/2020 11:51   MS DIGITAL DIAG TOMO BILAT  Result Date: 01/23/2020 CLINICAL DATA:  45 year old female presenting with a new lump in the left breast. EXAM: DIGITAL DIAGNOSTIC BILATERAL MAMMOGRAM WITH CAD AND TOMO ULTRASOUND LEFT BREAST COMPARISON:  None.  ACR Breast Density Category b: There are scattered areas of fibroglandular density. FINDINGS: Mammogram: Right breast: No suspicious mass, distortion, or microcalcifications are identified to suggest presence of malignancy. Left breast: Spot compression tomosynthesis views were performed in addition to full field standard views. There is an irregular spiculated  mass at the palpable site of concern in the upper-outer quadrant of the left breast. The mass spans approximately 3.5 cm. Associated with the mass are several pleomorphic microcalcifications. In the far upper outer quadrant of the left breast there is an oval circumscribed mass measuring 0.9 cm, likely an intramammary lymph node. There are several enlarged left axillary lymph nodes. Mammographic images were processed with CAD. On physical exam, I palpate a fixed discrete mass at the palpable site of concern in the upper outer left breast. Ultrasound: Targeted ultrasound is performed in the left breast at 1 o'clock 5 cm from nipple demonstrating an irregular hypoechoic mass measuring 3.4 x 2.1 x 3.6 cm, which corresponds to the palpable abnormality. There is internal vascularity within the mass. Targeted ultrasound in the left breast at 2 o'clock 10 cm from the nipple demonstrates a round circumscribed hypoechoic mass with hilar flow measuring 0.8 x 0.8 x 0.8 cm, consistent with an intramammary lymph node. There is loss of the normal fatty hilum. Targeted ultrasound of the left axilla demonstrates at least two abnormal lymph nodes with cortical thickening measuring up to 1.7 cm. IMPRESSION: 1. Palpable left breast mass with associated calcifications overall measuring 3.6 cm is highly suspicious. 2. Left breast mass at 2 o'clock 10 cm from nipple is concerning for an abnormal intramammary lymph node. 3.  Left axillary adenopathy (at least 2 lymph nodes). RECOMMENDATION: Ultrasound-guided core needle biopsy x 3 for the above findings. This will be performed same day. I have discussed the findings and recommendations with the patient. BI-RADS CATEGORY  5: Highly suggestive of malignancy. Electronically Signed   By: Audie Pinto M.D.   On: 01/23/2020 13:36   MM CLIP PLACEMENT LEFT  Result Date: 01/23/2020 CLINICAL DATA:  Status post 3 ultrasound-guided biopsies today. EXAM: DIAGNOSTIC LEFT MAMMOGRAM POST  ULTRASOUND BIOPSY x3 COMPARISON:  Previous exam(s). FINDINGS: Mammographic images were obtained following ultrasound guided biopsies of masses in the LEFT breast at the 1 o'clock axis and 2 o'clock axis followed by ultrasound-guided biopsy of an enlarged lymph node in the LEFT axilla. All 3 biopsy marking clips are in expected position at the sites of biopsy. IMPRESSION: 1. Appropriate positioning of the ribbon shaped biopsy marking clip at the site of biopsy in the upper-outer quadrant of the LEFT breast corresponding to the mass at the 1 o'clock axis. 2. Appropriate positioning of the coil shaped biopsy marking clip at the site of biopsy in the upper-outer quadrant of the LEFT breast corresponding to the mass at the 2 o'clock axis. 3. Appropriate positioning of the Q shaped biopsy marking clip at the site of biopsy in the LEFT axilla. Final Assessment: Post Procedure Mammograms for Marker Placement Electronically Signed   By: Franki Cabot M.D.   On: 01/23/2020 15:49    Pelvic/Bimanual Pap is not indicated today per BCCCP guidelines.   Smoking History: Patient has never smoked.   Patient Navigation: Patient education provided. Access to services provided for patient through [facility] program. Spanish interpreter Rebeca Allegra from [facility] provided.   Colorectal Cancer Screening: Per patient has never had colonoscopy completed. No complaints today.    Breast and Cervical Cancer Risk Assessment: Patient does not have family history  of breast cancer, known genetic mutations, or radiation treatment to the chest before age 53. Patient does not have history of cervical dysplasia, immunocompromised, or DES exposure in-utero.  Risk Assessment     Risk Scores       02/23/2021 01/23/2020   Last edited by: Demetrius Revel, LPN [place]ill, Sherie Mamie Nick, LPN   5-year risk: 2.1 % 0.7 %   Lifetime risk: 14.4 % 9.4 %            A: BCCCP exam without pap smear Complaint of lump versus scar tissue at lumpectomy  site.  P: Referred patient to the [facility] for a diagnostic mammogram per recommendation. Appointment scheduled Tuesday, February 23, 2021 at 1030.  Loletta Parish, RN 02/23/2021 9:19 AM

## 2021-02-23 NOTE — Patient Instructions (Signed)
Explained breast self awareness with [name]. Patient did not need a Pap smear today due to last Pap smear and HPV typing was 12/11/2020. Let her know BCCCP will cover Pap smears and HPV typing every 5 years unless has a history of abnormal Pap smears. Referred patient to the [facility] for a diagnostic mammogram per recommendation. Appointment scheduled Tuesday, February 23, 2021 at 1030. Patient aware of appointment and will be there. [place] verbalized understanding.  Venna Berberich, Arvil Chaco, RN 9:20 AM

## 2021-03-04 ENCOUNTER — Inpatient Hospital Stay (HOSPITAL_BASED_OUTPATIENT_CLINIC_OR_DEPARTMENT_OTHER): Payer: No Typology Code available for payment source | Admitting: Oncology

## 2021-03-04 ENCOUNTER — Other Ambulatory Visit: Payer: Self-pay

## 2021-03-04 VITALS — BP 123/77 | HR 85 | Temp 97.7°F | Resp 18

## 2021-03-04 DIAGNOSIS — C50412 Malignant neoplasm of upper-outer quadrant of left female breast: Secondary | ICD-10-CM

## 2021-03-04 DIAGNOSIS — Z170 Estrogen receptor positive status [ER+]: Secondary | ICD-10-CM

## 2021-03-04 MED ORDER — PREDNISONE 20 MG PO TABS: 20.0000 mg | ORAL_TABLET | Freq: Every day | ORAL | 1 refills | Status: DC

## 2021-03-04 MED ORDER — TOBRAMYCIN-DEXAMETHASONE 0.3-0.1 % OP SUSP: 1.0000 [drp] | Freq: Two times a day (BID) | OPHTHALMIC | 0 refills | Status: DC

## 2021-03-04 NOTE — Progress Notes (Addendum)
[place]  Telephone:([phone] Fax:(336) [phone]     ID: Jaquetta Currier DOB: [birth year]  MR#: [id number]  QHU#:[id number]  Patient Care Team: Jacelyn Pi, Lilia Argue, MD as PCP - General (Family Medicine) Rolm Bookbinder, MD as Consulting Physician (General Surgery) Allyna Pittsley, Virgie Dad, MD as Consulting Physician (Oncology) Eppie Gibson, MD as Attending Physician (Radiation Oncology) Rockwell Germany, RN as Oncology Nurse Navigator Mauro Kaufmann, RN as Oncology Nurse Navigator Chauncey Cruel, MD OTHER MD:  CHIEF COMPLAINT: Triple negative breast cancer  CURRENT TREATMENT: Observation   INTERVAL HISTORY:  Vernida returns today for follow up and treatment of her triple negative breast cancer.    She began pembrolizumab on 11/23/2020.  Initially she tolerated that well, though she complained of an itchy feeling at times. She also had a cough, felt likely to be due to reflux. A CXR 01/04/2021 showed no evidence of pneumonitis.  Subsequently she developed a rash, which was treated with a Medrol Dosepak, with some improvement.  She was seen last week with worsening facial rash and she was started on prednisone 20 mg daily.  Her pembrolizumab has been discontinued.  Her last dose was 01/04/2021  Her most recent echocardiogram on 10/21/2020 showed an ejection fraction of 60-65%.  Since her last visit, she underwent routine bilateral diagnostic mammography with tomography at [facility] on 02/23/21 showing: breast density category C; no evidence of malignancy in either breast.   We are following her TSH: Lab Results  Component Value Date   TSH 0.644 01/25/2021   TSH 1.149 01/13/2021   TSH 1.720 01/04/2021   TSH 3.924 12/14/2020   TSH 2.500 09/25/2020    REVIEW OF SYSTEMS: Deanna started her prednisone the day after her visit here and within 24 hours her rash disappeared.  She will complete her 5 days of treatment tomorrow.  She is having no side  effects from the prednisone.   COVID 19 VACCINATION STATUS: She had the J&J vaccine x1, infection 06/20/2020, and Pfizer booster on 08/19/2020   HISTORY OF CURRENT ILLNESS: From the original intake note:  Ahmani Daoud Perdomo herself palpated a left beast lump with associated pain. She underwent bilateral diagnostic mammography with tomography and left breast ultrasonography at [facility] on 01/23/2020 showing: breast density category B; palpable 3.6 cm left breast mass with associated calcifications at 1 o'clock; 0.8 cm left breast mass at 2 o'clock, concerning for abnormal intramammary lymph node; at least two abnormal left axillary lymph nodes.  Accordingly on 01/23/2020 she proceeded to biopsy of the left breast area in question. The pathology from this procedure ([id number]) showed: invasive ductal carcinoma at 1 o'clock, grade 3. Prognostic indicators significant for: estrogen receptor, 20% positive with moderate staining intensity and progesterone receptor, 0% negative. Proliferation marker Ki67 at 40%. HER2 negative by immunohistochemistry (1+).  The left axillary lymph node confirmed metastatic carcinoma.  The left intramammary lymph node was negative for carcinoma.  The patient's subsequent history is as detailed below.   PAST MEDICAL HISTORY: Past Medical History:  Diagnosis Date   Allergy    Asthma    Breast cancer in female [facility])    Left   Breast disorder    breast cancer May 2021   Chronic back pain    History of breast cancer    Hx of migraines    Morbid obesity ([place])     PAST SURGICAL HISTORY: Past Surgical History:  Procedure Laterality Date   BREAST LUMPECTOMY WITH  RADIOACTIVE SEED AND SENTINEL LYMPH NODE BIOPSY Left 07/15/2020   Procedure: LEFT BREAST LUMPECTOMY WITH RADIOACTIVE SEED AND LEFT AXILLARY SENTINEL LYMPH NODE BIOPSY, LEFT AXILLARY NODE RADIOACTIVE SEED GUIDED EXCISION, LEFT BREAST RADIOACTIVE SEED GUIDED EXCISION IM NODE;  Surgeon: Rolm Bookbinder,  MD;  Location: [facility];  Service: General;  Laterality: Left;  PEC BLOCK   CESAREAN SECTION WITH BILATERAL TUBAL LIGATION Bilateral 07/23/2015   Procedure: CESAREAN SECTION WITH BILATERAL TUBAL LIGATION;  Surgeon: Donnamae Jude, MD;  Location: [facility];  Service: Obstetrics;  Laterality: Bilateral;   PORTACATH PLACEMENT Right 02/11/2020   Procedure: INSERTION PORT-A-CATH WITH ULTRASOUND GUIDANCE;  Surgeon: Rolm Bookbinder, MD;  Location: [facility];  Service: General;  Laterality: Right;   TUBAL LIGATION      FAMILY HISTORY: Family History  Problem Relation Age of Onset   Heart disease Maternal Grandmother    Her parents are both living as of 01/2020, her father age 35 and her mother age 81. She has three maternal half-siblings (1 sister, 2 brothers) and four paternal half-siblings (1 sister, 3 brothers). There is no family history of cancer to her knowledge.   GYNECOLOGIC HISTORY:  Patient's last menstrual period was 01/20/2020 (exact date). Menarche: 45 years old Age at first live birth: 45 years old [place] P 3 LMP 01/2020 Contraceptive s/p tubal ligation HRT n/a  Hysterectomy? no BSO? 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 Husband Vaughan Sine works for a Financial trader, both in [employer]. They are both from [place]. She lives at home with [place] and their three children-- [name], Bluewell, and [name]. She attends an [employer].    ADVANCED DIRECTIVES: In the absence of any documentation to the contrary, the patient's spouse is their HCPOA.    HEALTH MAINTENANCE: Social History   Tobacco Use   Smoking status: Never   Smokeless tobacco: Never  Vaping Use   Vaping Use: Never used  Substance Use Topics   Alcohol use: No   Drug use: Never     Colonoscopy: n/a (age)  PAP: 12/2020  Bone density: n/a (age)   Allergies  Allergen Reactions   Doxorubicin  Hcl Rash    Experience chest tightness, abdominal pain, and redness around lips during and shortly after doxorubicin bolus.     Current Outpatient Medications  Medication Sig Dispense Refill   albuterol (VENTOLIN HFA) 108 (90 Base) MCG/ACT inhaler Inhale 1-2 puffs into the lungs every 4 (four) hours as needed for wheezing or shortness of breath. 8 g 5   loratadine (CLARITIN) 10 MG tablet Take 1 tablet (10 mg total) by mouth daily. 90 tablet 4   omeprazole (PRILOSEC) 40 MG capsule Take 1 capsule (40 mg total) by mouth at bedtime. 30 capsule 0   predniSONE (DELTASONE) 20 MG tablet Take 1 tablet (20 mg total) by mouth daily with breakfast. 5 tablet 1   predniSONE (DELTASONE) 5 MG tablet Take 6 tablets day 1, 5 tablets day 2, 4 tablets day 3, 3 tablets day 4, 2 tablets day 5 and ine tablet day 6 21 tablet 0   tobramycin-dexamethasone (TOBRADEX) ophthalmic solution Place 1 drop into both eyes in the morning and at bedtime. 5 mL 0   No current facility-administered medications for this visit.    OBJECTIVE: Spanish speaker in no acute distress  Vitals:   03/08/21 0843  BP: 124/74  Pulse: 80  Resp: 16  Temp:  97.7 F (36.5 C)  SpO2: 100%     Body mass index is 36.45 kg/m.   Wt Readings from Last 3 Encounters:  03/08/21 199 lb 4.8 oz (90.4 kg)  02/23/21 194 lb 6.4 oz (88.2 kg)  01/25/21 198 lb 8 oz (90 kg)     ECOG FS:1 - Symptomatic but completely ambulatory  Sclerae unicteric, EOMs intact Wearing a mask No cervical or supraclavicular adenopathy Lungs no rales or rhonchi Heart regular rate and rhythm Abd soft, nontender, positive bowel sounds MSK no focal spinal tenderness, no upper extremity lymphedema Neuro: nonfocal, well oriented, appropriate affect Breasts: The right breast is benign.  The left breast is status post lumpectomy and radiation.  The cosmetic result is excellent.  There is no evidence of residual or recurrent disease.  Both axillae are benign.   LAB  RESULTS:  CMP     Component Value Date/Time   NA 140 01/25/2021 1042   NA 138 08/15/2017 1525   K 3.8 01/25/2021 1042   CL 107 01/25/2021 1042   CO2 27 01/25/2021 1042   GLUCOSE 110 (H) 01/25/2021 1042   BUN 10 01/25/2021 1042   BUN 12 08/15/2017 1525   CREATININE 0.70 01/25/2021 1042   CREATININE 0.78 01/13/2021 1305   CALCIUM 9.4 01/25/2021 1042   PROT 6.7 01/25/2021 1042   PROT 7.4 01/03/2017 1642   ALBUMIN 3.5 01/25/2021 1042   ALBUMIN 4.3 01/03/2017 1642   AST 24 01/25/2021 1042   AST 24 01/13/2021 1305   ALT 23 01/25/2021 1042   ALT 24 01/13/2021 1305   ALKPHOS 100 01/25/2021 1042   BILITOT 0.3 01/25/2021 1042   BILITOT 0.4 01/13/2021 1305   GFRNONAA >60 01/25/2021 1042   GFRNONAA >60 01/13/2021 1305   GFRAA >60 06/03/2020 0949   GFRAA >60 02/26/2020 0915    No results found for: TOTALPROTELP, ALBUMINELP, A1GS, A2GS, BETS, BETA2SER, GAMS, MSPIKE, SPEI  Lab Results  Component Value Date   WBC 2.7 (L) 01/25/2021   NEUTROABS 1.3 (L) 01/25/2021   HGB 11.9 (L) 01/25/2021   HCT 36.2 01/25/2021   MCV 84.8 01/25/2021   PLT 174 01/25/2021    No results found for: LABCA2  No components found for: OXBDZH299  No results for input(s): INR in the last 168 hours.  No results found for: LABCA2  No results found for: MEQ683  No results found for: MHD622  No results found for: WLN989  No results found for: CA2729  No components found for: HGQUANT  No results found for: CEA1 / No results found for: CEA1   No results found for: AFPTUMOR  No results found for: CHROMOGRNA  No results found for: KPAFRELGTCHN, LAMBDASER, KAPLAMBRATIO (kappa/lambda light chains)  No results found for: HGBA, HGBA2QUANT, HGBFQUANT, HGBSQUAN (Hemoglobinopathy evaluation)   No results found for: LDH  No results found for: IRON, TIBC, IRONPCTSAT (Iron and TIBC)  No results found for: FERRITIN  Urinalysis No results found for: COLORURINE, APPEARANCEUR, LABSPEC, PHURINE,  GLUCOSEU, HGBUR, BILIRUBINUR, KETONESUR, PROTEINUR, UROBILINOGEN, NITRITE, LEUKOCYTESUR   STUDIES: MS DIGITAL DIAG TOMO BILAT  Result Date: 02/23/2021 CLINICAL DATA:  Patient with history of left breast lumpectomy. EXAM: DIGITAL DIAGNOSTIC BILATERAL MAMMOGRAM WITH TOMOSYNTHESIS AND CAD TECHNIQUE: Bilateral digital diagnostic mammography and breast tomosynthesis was performed. The images were evaluated with computer-aided detection. COMPARISON:  Previous exam(s). ACR Breast Density Category c: The breast tissue is heterogeneously dense, which may obscure small masses. FINDINGS: Interval postlumpectomy changes left breast. No concerning masses, calcifications or nonsurgical distortion  identified within the left breast. IMPRESSION: No mammographic evidence for malignancy. Postlumpectomy changes left breast. RECOMMENDATION: Bilateral diagnostic mammography 1 year. I have discussed the findings and recommendations with the patient. If applicable, a reminder letter will be sent to the patient regarding the next appointment. BI-RADS CATEGORY  2: Benign. Electronically Signed   By: Lovey Newcomer M.D.   On: 02/23/2021 10:54    ELIGIBLE FOR AVAILABLE RESEARCH PROTOCOL: [id number]?  ASSESSMENT: 45 y.o. [place] woman status post left breast upper outer quadrant biopsy 01/23/2020 for a clinical T3 N1, 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  (a) chest CT scan and bone scan 02/07/2020 showed no evidence of metastatic disease  (1) neoadjuvant chemotherapy consisting of doxorubicin and cyclophosphamide in dose dense fashion x4 started 02/12/2020, completed 03/25/2020, followed by paclitaxel and carboplatin weekly x12 started 04/08/2020 and completed on 05/28/2020  (a) echo 02/07/2020 shows an ejection fraction in the 55-60% range  (b) Epirubicin substituted for Doxorubicin starting with cycle 2 of AC secondary to allergic rash from Doxorubicin  (2) status post  left lumpectomy and sentinel lymph node sampling 07/15/2020 for a ypT1b ypN0 residual invasive ductal carcinoma, grade 3, with negative margins.  (a) a total of 4 left axillary lymph nodes were removed  (b) repeat prognostic panel on the final pathology again triple negative, with an MIB-1 of 50%.  (3) adjuvant radiation completed 10/09/2020  (a) sensitizing capecitabine attempted but not tolerated by the patient  (4) genetics testing 02/07/2020 through the [facility] Common Hereditary Cancers Panel found no deleterious mutations in  APC, ATM, AXIN2, BARD1, BMPR1A, BRCA1, BRCA2, BRIP1, CDH1, CDKN2A (p14ARF), CDKN2A (p16INK4a), CKD4, CHEK2, CTNNA1, DICER1, EPCAM (Deletion/duplication testing only), GREM1 (promoter region deletion/duplication testing only), KIT, MEN1, MLH1, MSH2, MSH3, MSH6, MUTYH, NBN, NF1, NHTL1, PALB2, PDGFRA, PMS2, POLD1, POLE, PTEN, RAD50, RAD51C, RAD51D, RNF43, SDHB, SDHC, SDHD, SMAD4, SMARCA4. STK11, TP53, TSC1, TSC2, and VHL.  The following genes were evaluated for sequence changes only: SDHA and HOXB13 c.251G>A variant only.  (a) VUS in MSH6 called c.2744C>G identified.   (5)  adjuvant pembrolizumab started 11/23/2020, to continue for 1 year  (a) discontinued after 01/04/2021 dose with multiple side effects requiring steroids for resolution   PLAN: Naquisha's rash resolved immediately with steroids, indicating that indeed it is due to hyperactivity of her immune system due to the pembrolizumab.  She has been off that medication now for more than 2 months and I would expect its activity will continue to wane with time.  She still has a feeling of itching all over, which is likely related to that as well.  This is worse with heat and sun and of course she has learned to stay away from that.  If the rash recurs when she completes her 5 days of prednisone she has a refill for 5 more days.  I did ask her to call us if that happens.  Assuming the problem is resolved she will have a  bone density in about a month then return to see me in about 3 months.  If she has osteopenia we will consider zoledronate.  She would like to have her port removed.  I have sent her surgeon a note regarding that.  Total encounter time 20 minutes.Sarajane Jews C. Angelyna Henderson, MD 03/08/21 9:07 AM Medical Oncology and Hematology [facility] [place],  [address] Tel. [phone]    Fax. [phone]   IWilburn Mylar, am acting  as scribe for Dr. Sarajane Jews C. Lavana Huckeba.  I, Lurline Del MD, have reviewed the above documentation for accuracy and completeness, and I agree with the above.   *Total Encounter Time as defined by the Centers for Medicare and Medicaid Services includes, in addition to the face-to-face time of a patient visit (documented in the note above) non-face-to-face time: obtaining and reviewing outside history, ordering and reviewing medications, tests or procedures, care coordination (communications with other health care professionals or caregivers) and documentation in the medical record.

## 2021-03-04 NOTE — Progress Notes (Signed)
[name] dropped in today for an unannounced visit.  She has been having itching and redness in both eyes but today it seems to be spreading over her whole face and she was very concerned and came in.  She notices that whenever she gets sun exposed at the problem gets worse.  I think this is all going to be still related to her pembrolizumab treatment even though it has been 2 months since the last dose.  She did improve briefly when we gave her a Medrol Dosepak.  I am giving her 20 mg of prednisone daily beginning today continuing for 5 days and if she is not significantly better she will take 5 more days after that.  I am also starting her on TobraDex.  Of course we are discontinuing all future pembrolizumab treatment in her case.  She has a repeat appointment with me 03/08/2021.  Total encounter time 15 minutes.*

## 2021-03-05 NOTE — Progress Notes (Signed)
..  The following Assist/Replace Program for [facility] from [employer] has been terminated due to Dr. Jana Hakim discontinued on 03/04/2021.  Last DOS: 01/04/2021.

## 2021-03-08 ENCOUNTER — Telehealth: Payer: Self-pay | Admitting: Oncology

## 2021-03-08 ENCOUNTER — Ambulatory Visit: Payer: Self-pay

## 2021-03-08 ENCOUNTER — Other Ambulatory Visit: Payer: Self-pay

## 2021-03-08 ENCOUNTER — Inpatient Hospital Stay: Payer: No Typology Code available for payment source

## 2021-03-08 ENCOUNTER — Inpatient Hospital Stay (HOSPITAL_BASED_OUTPATIENT_CLINIC_OR_DEPARTMENT_OTHER): Payer: No Typology Code available for payment source | Admitting: Oncology

## 2021-03-08 VITALS — BP 124/74 | HR 80 | Temp 97.7°F | Resp 16 | Ht 62.0 in | Wt 199.3 lb

## 2021-03-08 DIAGNOSIS — C50412 Malignant neoplasm of upper-outer quadrant of left female breast: Secondary | ICD-10-CM

## 2021-03-08 DIAGNOSIS — Z170 Estrogen receptor positive status [ER+]: Secondary | ICD-10-CM

## 2021-03-08 NOTE — Telephone Encounter (Signed)
Scheduled appointment per 06/27 los. Patient is aware.

## 2021-03-29 ENCOUNTER — Other Ambulatory Visit: Payer: Self-pay

## 2021-03-29 ENCOUNTER — Ambulatory Visit: Payer: Self-pay

## 2021-03-30 ENCOUNTER — Other Ambulatory Visit: Payer: Self-pay | Admitting: Oncology

## 2021-03-30 DIAGNOSIS — Z170 Estrogen receptor positive status [ER+]: Secondary | ICD-10-CM

## 2021-03-30 DIAGNOSIS — C50412 Malignant neoplasm of upper-outer quadrant of left female breast: Secondary | ICD-10-CM

## 2021-04-19 ENCOUNTER — Ambulatory Visit: Payer: No Typology Code available for payment source | Attending: Oncology

## 2021-04-19 ENCOUNTER — Other Ambulatory Visit: Payer: Self-pay

## 2021-04-19 VITALS — Wt 193.1 lb

## 2021-04-19 DIAGNOSIS — Z483 Aftercare following surgery for neoplasm: Secondary | ICD-10-CM | POA: Insufficient documentation

## 2021-04-19 NOTE — Therapy (Signed)
[place], [place], [address] Phone: [phone]   Fax:  [phone]  Physical Therapy Treatment  Patient Details  Name: [name] MRN: [id number] Date of Birth: 1976-04-09 Referring Provider (PT): Dr. Rolm Bookbinder   Encounter Date: 04/19/2021   PT End of Session - 04/19/21 1000     Visit Number 2   # unchanged due to screen only   PT Start Time 0957    PT Stop Time 1001    PT Time Calculation (min) 4 min    Activity Tolerance Patient tolerated treatment well    Behavior During Therapy [facility] for tasks assessed/performed             Past Medical History:  Diagnosis Date   Allergy    Asthma    Breast cancer in female [facility])    Left   Breast disorder    breast cancer May 2021   Chronic back pain    History of breast cancer    Hx of migraines    Morbid obesity ([place])     Past Surgical History:  Procedure Laterality Date   BREAST LUMPECTOMY WITH RADIOACTIVE SEED AND SENTINEL LYMPH NODE BIOPSY Left 07/15/2020   Procedure: LEFT BREAST LUMPECTOMY WITH RADIOACTIVE SEED AND LEFT AXILLARY SENTINEL LYMPH NODE BIOPSY, LEFT AXILLARY NODE RADIOACTIVE SEED GUIDED EXCISION, LEFT BREAST RADIOACTIVE SEED GUIDED EXCISION IM NODE;  Surgeon: Rolm Bookbinder, MD;  Location: [facility];  Service: General;  Laterality: Left;  PEC BLOCK   CESAREAN SECTION WITH BILATERAL TUBAL LIGATION Bilateral 07/23/2015   Procedure: CESAREAN SECTION WITH BILATERAL TUBAL LIGATION;  Surgeon: Donnamae Jude, MD;  Location: [facility];  Service: Obstetrics;  Laterality: Bilateral;   PORTACATH PLACEMENT Right 02/11/2020   Procedure: INSERTION PORT-A-CATH WITH ULTRASOUND GUIDANCE;  Surgeon: Rolm Bookbinder, MD;  Location: [facility];  Service: General;  Laterality: Right;   TUBAL LIGATION      Vitals:   04/19/21 0958  Weight: 193 lb 2 oz (87.6 kg)     Subjective Assessment - 04/19/21 0959     Subjective Pt  returns for 3 month L-Dex screen.    Pertinent History Patient was diagnosed on 01/23/2020 with left grade III invasive ductal carcinoma breast cancer. It is ER positive, PR negative, and HER2 negative with a Ki67 of 40%. She has 2 abnormal appearing lymph nodes and 1 was biopsied and found to be positive at time of diagnosis. Patient with interpreter present. She underwent neoadjuvant chemotherapy from 02/12/2020 - 05/28/2020. 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 She underwent a left lumpectomy and sentinel node biopsy ( 4 negative nodes) on 07/15/2020. She will undergo radiation and anti-estrogen therapy. She reports her neuropathy has resolved and has some limitation in shoulder ROM.                    L-DEX FLOWSHEETS - 04/19/21 0900       L-DEX LYMPHEDEMA SCREENING   Measurement Type Unilateral    L-DEX MEASUREMENT EXTREMITY Upper Extremity    POSITION  Standing    DOMINANT SIDE Right    At Risk Side Left    BASELINE SCORE (UNILATERAL) 3    L-DEX SCORE (UNILATERAL) 2.8    VALUE CHANGE (UNILAT) -0.2  PT Long Term Goals - 08/10/20 1400       PT LONG TERM GOAL #1   Title Patient will be able to demonstrate she has regained full shoulder ROM and function post operatively compared to baselines.    Time 6    Period Months    Status Achieved                   Plan - 04/19/21 1001     Clinical Impression Statement Pt returns for her 3 month L-Dex screen. Her change from baseline of -0.2 is WNLs so no further treatment is required at this time except to cont every 3 month L-Dex screens which pt is agreeable to.    PT Next Visit Plan Cont every 3 month L-Dex screens for up to 2 years from [facility].    Consulted and Agree with Plan of Care Patient;Other (Comment)   interpreter            Patient will benefit from skilled therapeutic intervention in order to improve the following deficits  and impairments:     Visit Diagnosis: Aftercare following surgery for neoplasm     Problem List Patient Active Problem List   Diagnosis Date Noted   Language barrier 12/11/2020   Morbid obesity ([place])    History of breast cancer    Port-A-Cath in place 03/25/2020   Genetic testing 02/13/2020   Malignant neoplasm of upper-outer quadrant of left breast in female, estrogen receptor positive ([place]) 01/27/2020   Pregnancy 07/23/2015   Impaired ability to use community resources due to language barrier 07/13/2013    Otelia Limes, PTA 04/19/2021, 10:12 AM  [address], [place], [address] Phone: [phone]   Fax:  [phone]  Name: [name] MRN: [id number] Date of Birth: February 29, 1976

## 2021-04-20 DIAGNOSIS — Z8616 Personal history of COVID-19: Secondary | ICD-10-CM

## 2021-04-20 HISTORY — DX: Personal history of COVID-19: Z86.16

## 2021-04-22 ENCOUNTER — Other Ambulatory Visit: Payer: Self-pay | Admitting: General Surgery

## 2021-04-22 ENCOUNTER — Encounter (HOSPITAL_BASED_OUTPATIENT_CLINIC_OR_DEPARTMENT_OTHER): Payer: Self-pay | Admitting: General Surgery

## 2021-04-22 ENCOUNTER — Other Ambulatory Visit: Payer: Self-pay

## 2021-04-22 NOTE — Progress Notes (Signed)
Pre op call completed with [place] on the line.  Pt reports headache and body ache on 04/20/21 with + Covid 19 home test.  Pt states that she has had no symptoms since. Reviewed with Amedeo Plenty RN and Dr Elgie Congo.  If pt remains asymptomatic we can proceed with surgery scheduled on 04/30/21 per current protocol.  If pt does display any symptoms between now and the day of surgery the patient is to call Dr Cristal Generous office or [facility] to postpone surgery. Pt verbalized understanding of plan of care with the assistance of interpreter.

## 2021-04-26 ENCOUNTER — Telehealth: Payer: Self-pay | Admitting: Oncology

## 2021-04-26 NOTE — Telephone Encounter (Signed)
Scheduled appt per 8/12 sch msg. Called pt, no answer. Left msg with appt date and time.

## 2021-04-28 ENCOUNTER — Inpatient Hospital Stay: Payer: No Typology Code available for payment source | Attending: Oncology | Admitting: Adult Health

## 2021-04-28 DIAGNOSIS — Z5329 Procedure and treatment not carried out because of patient's decision for other reasons: Secondary | ICD-10-CM

## 2021-04-28 NOTE — Progress Notes (Signed)
Kewanda did not show for her appointment today.   We do not have a spanish version of the no show letter, I have requested one from translation services.  Wilber Bihari, NP

## 2021-04-29 NOTE — Anesthesia Preprocedure Evaluation (Addendum)
Anesthesia Evaluation  Patient identified by MRN, date of birth, ID band Patient awake    Reviewed: Allergy & Precautions, NPO status , Patient's Chart, lab work & pertinent test results  History of Anesthesia Complications Negative for: history of anesthetic complications  Airway Mallampati: II  TM Distance: >3 FB Neck ROM: Full    Dental  (+) Dental Advisory Given   Pulmonary asthma ,  04/20/2021 Covid POS: asymptomatic presently   Pulmonary exam normal        Cardiovascular negative cardio ROS   Rhythm:Regular Rate:Normal  10/2020 ECHO: EF 60-65%, normal LVF, no significant valvular abnormalities   Neuro/Psych negative neurological ROS     GI/Hepatic Neg liver ROS, GERD  Medicated and Controlled,  Endo/Other  Morbid obesity  Renal/GU negative Renal ROS     Musculoskeletal   Abdominal (+) + obese,   Peds  Hematology negative hematology ROS (+)   Anesthesia Other Findings Breast cancer  Reproductive/Obstetrics                            Anesthesia Physical Anesthesia Plan  ASA: 3  Anesthesia Plan: MAC   Post-op Pain Management:    Induction:   PONV Risk Score and Plan: 2 and Ondansetron, Dexamethasone and Treatment may vary due to age or medical condition  Airway Management Planned: Simple Face Mask and Natural Airway  Additional Equipment: None  Intra-op Plan:   Post-operative Plan:   Informed Consent: I have reviewed the patients History and Physical, chart, labs and discussed the procedure including the risks, benefits and alternatives for the proposed anesthesia with the patient or authorized representative who has indicated his/her understanding and acceptance.     Dental advisory given and Interpreter used for interveiw  Plan Discussed with: CRNA and Surgeon  Anesthesia Plan Comments:        Anesthesia Quick Evaluation

## 2021-04-30 ENCOUNTER — Ambulatory Visit (HOSPITAL_BASED_OUTPATIENT_CLINIC_OR_DEPARTMENT_OTHER): Payer: No Typology Code available for payment source | Admitting: Anesthesiology

## 2021-04-30 ENCOUNTER — Ambulatory Visit (HOSPITAL_BASED_OUTPATIENT_CLINIC_OR_DEPARTMENT_OTHER)
Admission: RE | Admit: 2021-04-30 | Discharge: 2021-04-30 | Disposition: A | Discharge status: Home / Self Care | Payer: No Typology Code available for payment source | Attending: General Surgery | Admitting: General Surgery

## 2021-04-30 ENCOUNTER — Encounter (HOSPITAL_BASED_OUTPATIENT_CLINIC_OR_DEPARTMENT_OTHER)
Admission: RE | Disposition: A | Discharge status: Home / Self Care | Payer: Self-pay | Source: Home / Self Care | Attending: General Surgery

## 2021-04-30 ENCOUNTER — Other Ambulatory Visit: Payer: Self-pay

## 2021-04-30 ENCOUNTER — Encounter (HOSPITAL_BASED_OUTPATIENT_CLINIC_OR_DEPARTMENT_OTHER): Payer: Self-pay | Admitting: General Surgery

## 2021-04-30 DIAGNOSIS — Z888 Allergy status to other drugs, medicaments and biological substances status: Secondary | ICD-10-CM | POA: Insufficient documentation

## 2021-04-30 DIAGNOSIS — Z6835 Body mass index (BMI) 35.0-35.9, adult: Secondary | ICD-10-CM | POA: Insufficient documentation

## 2021-04-30 DIAGNOSIS — Z853 Personal history of malignant neoplasm of breast: Secondary | ICD-10-CM | POA: Insufficient documentation

## 2021-04-30 DIAGNOSIS — Z452 Encounter for adjustment and management of vascular access device: Secondary | ICD-10-CM | POA: Insufficient documentation

## 2021-04-30 DIAGNOSIS — Z8616 Personal history of COVID-19: Secondary | ICD-10-CM | POA: Insufficient documentation

## 2021-04-30 DIAGNOSIS — Z8249 Family history of ischemic heart disease and other diseases of the circulatory system: Secondary | ICD-10-CM | POA: Insufficient documentation

## 2021-04-30 HISTORY — PX: PORT-A-CATH REMOVAL: SHX5289

## 2021-04-30 SURGERY — REMOVAL PORT-A-CATH
Anesthesia: Monitor Anesthesia Care | Site: Chest

## 2021-04-30 MED ORDER — CHLORHEXIDINE GLUCONATE CLOTH 2 % EX PADS: 6.0000 | MEDICATED_PAD | Freq: Once | CUTANEOUS | Status: DC

## 2021-04-30 MED ORDER — ACETAMINOPHEN 500 MG PO TABS
ORAL_TABLET | ORAL | Status: AC
  Filled 2021-04-30: qty 2

## 2021-04-30 MED ORDER — LIDOCAINE HCL (PF) 2 % IJ SOLN
INTRAMUSCULAR | Status: AC
  Filled 2021-04-30: qty 5

## 2021-04-30 MED ORDER — BUPIVACAINE-EPINEPHRINE (PF) 0.5% -1:200000 IJ SOLN
INTRAMUSCULAR | Status: AC
  Filled 2021-04-30: qty 30

## 2021-04-30 MED ORDER — PROPOFOL 500 MG/50ML IV EMUL
INTRAVENOUS | Status: AC
  Filled 2021-04-30: qty 50

## 2021-04-30 MED ORDER — BUPIVACAINE HCL (PF) 0.25 % IJ SOLN
INTRAMUSCULAR | Status: AC
  Filled 2021-04-30: qty 30

## 2021-04-30 MED ORDER — ONDANSETRON HCL 4 MG/2ML IJ SOLN
INTRAMUSCULAR | Status: DC | PRN
  Administered 2021-04-30: 4 mg via INTRAVENOUS

## 2021-04-30 MED ORDER — MIDAZOLAM HCL 5 MG/5ML IJ SOLN
INTRAMUSCULAR | Status: DC | PRN
  Administered 2021-04-30: 2 mg via INTRAVENOUS

## 2021-04-30 MED ORDER — OXYCODONE HCL 5 MG PO TABS: 5.0000 mg | ORAL_TABLET | Freq: Once | ORAL | Status: DC | PRN

## 2021-04-30 MED ORDER — ONDANSETRON HCL 4 MG/2ML IJ SOLN
INTRAMUSCULAR | Status: AC
  Filled 2021-04-30: qty 2

## 2021-04-30 MED ORDER — ACETAMINOPHEN 500 MG PO TABS: 1000.0000 mg | ORAL_TABLET | Freq: Once | ORAL | Status: DC

## 2021-04-30 MED ORDER — FENTANYL CITRATE (PF) 100 MCG/2ML IJ SOLN
INTRAMUSCULAR | Status: DC | PRN
  Administered 2021-04-30: 25 ug via INTRAVENOUS
  Administered 2021-04-30: 50 ug via INTRAVENOUS
  Administered 2021-04-30: 25 ug via INTRAVENOUS

## 2021-04-30 MED ORDER — FENTANYL CITRATE (PF) 100 MCG/2ML IJ SOLN
INTRAMUSCULAR | Status: AC
  Filled 2021-04-30: qty 2

## 2021-04-30 MED ORDER — PROPOFOL 500 MG/50ML IV EMUL
INTRAVENOUS | Status: DC | PRN
  Administered 2021-04-30: 50 ug/kg/min via INTRAVENOUS

## 2021-04-30 MED ORDER — MEPERIDINE HCL 25 MG/ML IJ SOLN: 6.2500 mg | INTRAMUSCULAR | Status: DC | PRN

## 2021-04-30 MED ORDER — LIDOCAINE-EPINEPHRINE 1 %-1:100000 IJ SOLN
INTRAMUSCULAR | Status: AC
  Filled 2021-04-30: qty 1

## 2021-04-30 MED ORDER — ACETAMINOPHEN 500 MG PO TABS
1000.0000 mg | ORAL_TABLET | ORAL | Status: AC
  Administered 2021-04-30: 1000 mg via ORAL

## 2021-04-30 MED ORDER — DEXAMETHASONE SODIUM PHOSPHATE 10 MG/ML IJ SOLN
INTRAMUSCULAR | Status: DC | PRN
  Administered 2021-04-30: 8 mg via INTRAVENOUS

## 2021-04-30 MED ORDER — ENSURE PRE-SURGERY PO LIQD: 296.0000 mL | Freq: Once | ORAL | Status: DC

## 2021-04-30 MED ORDER — PROMETHAZINE HCL 25 MG/ML IJ SOLN: 6.2500 mg | INTRAMUSCULAR | Status: DC | PRN

## 2021-04-30 MED ORDER — MIDAZOLAM HCL 2 MG/2ML IJ SOLN: 0.5000 mg | Freq: Once | INTRAMUSCULAR | Status: DC | PRN

## 2021-04-30 MED ORDER — LIDOCAINE HCL 1 % IJ SOLN: INTRAMUSCULAR | Status: DC | PRN

## 2021-04-30 MED ORDER — LACTATED RINGERS IV SOLN: INTRAVENOUS | Status: DC

## 2021-04-30 MED ORDER — DEXAMETHASONE SODIUM PHOSPHATE 10 MG/ML IJ SOLN
INTRAMUSCULAR | Status: AC
  Filled 2021-04-30: qty 1

## 2021-04-30 MED ORDER — CEFAZOLIN SODIUM-DEXTROSE 2-4 GM/100ML-% IV SOLN
2.0000 g | INTRAVENOUS | Status: AC
  Administered 2021-04-30: 2 g via INTRAVENOUS

## 2021-04-30 MED ORDER — CEFAZOLIN SODIUM-DEXTROSE 2-4 GM/100ML-% IV SOLN
INTRAVENOUS | Status: AC
  Filled 2021-04-30: qty 100

## 2021-04-30 MED ORDER — FENTANYL CITRATE (PF) 100 MCG/2ML IJ SOLN: 25.0000 ug | INTRAMUSCULAR | Status: DC | PRN

## 2021-04-30 MED ORDER — OXYCODONE HCL 5 MG/5ML PO SOLN
5.0000 mg | Freq: Once | ORAL | Status: DC | PRN
Start: 2021-04-30 — End: 2021-04-30

## 2021-04-30 MED ORDER — PROPOFOL 10 MG/ML IV BOLUS
INTRAVENOUS | Status: AC
  Filled 2021-04-30: qty 20

## 2021-04-30 MED ORDER — MIDAZOLAM HCL 2 MG/2ML IJ SOLN
INTRAMUSCULAR | Status: AC
  Filled 2021-04-30: qty 2

## 2021-04-30 MED ORDER — LIDOCAINE 2% (20 MG/ML) 5 ML SYRINGE
INTRAMUSCULAR | Status: DC | PRN
  Administered 2021-04-30: 40 mg via INTRAVENOUS

## 2021-04-30 SURGICAL SUPPLY — 29 items
ADH SKN CLS APL DERMABOND .7 (GAUZE/BANDAGES/DRESSINGS) ×1
APL PRP STRL LF DISP 70% ISPRP (MISCELLANEOUS) ×1
BLADE SURG 15 STRL LF DISP TIS (BLADE) ×1 IMPLANT
BLADE SURG 15 STRL SS (BLADE) ×2
CHLORAPREP W/TINT 26 (MISCELLANEOUS) ×2 IMPLANT
COVER BACK TABLE 60X90IN (DRAPES) ×2 IMPLANT
COVER MAYO STAND STRL (DRAPES) ×2 IMPLANT
DECANTER SPIKE VIAL GLASS SM (MISCELLANEOUS) ×2 IMPLANT
DERMABOND ADVANCED (GAUZE/BANDAGES/DRESSINGS) ×1
DERMABOND ADVANCED .7 DNX12 (GAUZE/BANDAGES/DRESSINGS) ×1 IMPLANT
DRAPE LAPAROTOMY 100X72 PEDS (DRAPES) ×2 IMPLANT
DRAPE UTILITY XL STRL (DRAPES) ×2 IMPLANT
ELECT COATED BLADE 2.86 ST (ELECTRODE) ×2 IMPLANT
ELECT REM PT RETURN 9FT ADLT (ELECTROSURGICAL) ×2
ELECTRODE REM PT RTRN 9FT ADLT (ELECTROSURGICAL) ×1 IMPLANT
GLOVE SURG ENC MOIS LTX SZ7 (GLOVE) ×2 IMPLANT
GLOVE SURG UNDER POLY LF SZ7.5 (GLOVE) ×2 IMPLANT
GOWN STRL REUS W/ TWL LRG LVL3 (GOWN DISPOSABLE) ×2 IMPLANT
GOWN STRL REUS W/TWL LRG LVL3 (GOWN DISPOSABLE) ×4
NDL HYPO 25X1 1.5 SAFETY (NEEDLE) ×1 IMPLANT
NEEDLE HYPO 25X1 1.5 SAFETY (NEEDLE) ×2 IMPLANT
PACK BASIN DAY SURGERY FS (CUSTOM PROCEDURE TRAY) ×2 IMPLANT
PENCIL SMOKE EVACUATOR (MISCELLANEOUS) ×2 IMPLANT
SLEEVE SCD COMPRESS KNEE MED (STOCKING) IMPLANT
STRIP CLOSURE SKIN 1/2X4 (GAUZE/BANDAGES/DRESSINGS) ×1 IMPLANT
SUT MNCRL AB 4-0 PS2 18 (SUTURE) ×2 IMPLANT
SUT VIC AB 3-0 SH 27 (SUTURE) ×2
SUT VIC AB 3-0 SH 27X BRD (SUTURE) ×1 IMPLANT
SYR CONTROL 10ML LL (SYRINGE) ×2 IMPLANT

## 2021-04-30 NOTE — Discharge Instructions (Addendum)
POST OP INSTRUCTIONS  Always review your discharge instruction sheet given to you by the facility where your surgery was performed.   A prescription for pain medication may be given to you upon discharge. Take your pain medication as prescribed, if needed. If narcotic pain medicine is not needed, then you make take acetaminophen (Tylenol) or ibuprofen (Advil) as needed.  Take your usually prescribed medications unless otherwise directed. If you need a refill on your pain medication, please contact our office. All narcotic pain medicine now requires a paper prescription.  Phoned in and fax refills are no longer allowed by law.  Prescriptions will not be filled after 5 pm or on weekends.  You should follow a light diet for the remainder of the day after your procedure. Most patients will experience some mild swelling and/or bruising in the area of the incision. It may take several days to resolve. It is common to experience some constipation if taking pain medication after surgery. Increasing fluid intake and taking a stool softener (such as Colace) will usually help or prevent this problem from occurring. A mild laxative (Milk of Magnesia or Miralax) should be taken according to package directions if there are no bowel movements after 48 hours.  your surgeon used Dermabond (skin glue) on the incision, you may shower in 24 hours.  The glue will flake off over the next 2-3 weeks.  ACTIVITIES: resume normal activity tomorrow 9.You may need to see your doctor in the office for a follow-up appointment.  Please       check with your doctor.    WHEN TO CALL YOUR DOCTOR [phone]): Fever over 101.0 Chills Continued bleeding from incision Increased redness and tenderness at the site Shortness of breath, difficulty breathing   The clinic staff is available to answer your questions during regular business hours. Please don't hesitate to call and ask to speak to one of the nurses or medical  assistants for clinical concerns. If you have a medical emergency, go to the nearest emergency room or call 911.  A surgeon from [facility] is always on call at the hospital.     For further information, please visit [email or web address]     Post Anesthesia Home Care Instructions  Activity: Get plenty of rest for the remainder of the day. A responsible individual must stay with you for 24 hours following the procedure.  For the next 24 hours, DO NOT: -Drive a car -Paediatric nurse -Drink alcoholic beverages -Take any medication unless instructed by your physician -Make any legal decisions or sign important papers.  Meals: Start with liquid foods such as gelatin or soup. Progress to regular foods as tolerated. Avoid greasy, spicy, heavy foods. If nausea and/or vomiting occur, drink only clear liquids until the nausea and/or vomiting subsides. Call your physician if vomiting continues.  Special Instructions/Symptoms: Your throat may feel dry or sore from the anesthesia or the breathing tube placed in your throat during surgery. If this causes discomfort, gargle with warm salt water. The discomfort should disappear within 24 hours.  If you had a scopolamine patch placed behind your ear for the management of post- operative nausea and/or vomiting:  1. The medication in the patch is effective for 72 hours, after which it should be removed.  Wrap patch in a tissue and discard in the trash. Wash hands thoroughly with soap and water. 2. You may remove the patch earlier than 72 hours if you experience unpleasant side effects which may include dry mouth,  dizziness or visual disturbances. 3. Avoid touching the patch. Wash your hands with soap and water after contact with the patch.     No tylenol until after 1pm today.

## 2021-04-30 NOTE — Op Note (Signed)
Preoperative diagnosis: Breast cancer no longer needs venous access Postoperative diagnosis: Same as above Procedure: Right internal jugular vein port removal Surgeon: Dr. Serita Grammes Anesthesia: Local with sedation Estimated blood loss: Minimal Drains: None Specimens: None Complications: None Special count was correct completion Disposition recovery stable condition  Indications: This is a 45 year old female who has completed her therapy for breast cancer and no longer desires or needs venous access.  We discussed removing her port.  She desired this removed under sedation.  Procedure: After informed consent was obtained the patient was taken to the operating room.  She was given antibiotics.  She was placed under monitored anesthesia care.  She was then prepped and draped in the standard sterile surgical fashion.  A surgical timeout was then performed.  I infiltrated a mixture of Marcaine and lidocaine at the port site.  I then reentered this.  I remove the port and the line in their entirety.  I then obtained hemostasis.  I closed this with 3-0 Vicryl and 4-0 Monocryl.  Glue and Steri-Strips were applied.  She tolerated this well and was transferred to recovery.

## 2021-04-30 NOTE — H&P (Signed)
[name] is an 45 y.o. female.   Chief Complaint: breast cancer HPI: 63 yof completed therapy for breast cancer now ready for port removal. Doing well.   Past Medical History:  Diagnosis Date   Allergy    Asthma    Breast cancer in female [facility])    Left   Breast disorder    breast cancer May 2021   Chronic back pain    History of breast cancer    History of COVID-19 04/20/2021   Hx of migraines    Morbid obesity ([place])     Past Surgical History:  Procedure Laterality Date   BREAST LUMPECTOMY WITH RADIOACTIVE SEED AND SENTINEL LYMPH NODE BIOPSY Left 07/15/2020   Procedure: LEFT BREAST LUMPECTOMY WITH RADIOACTIVE SEED AND LEFT AXILLARY SENTINEL LYMPH NODE BIOPSY, LEFT AXILLARY NODE RADIOACTIVE SEED GUIDED EXCISION, LEFT BREAST RADIOACTIVE SEED GUIDED EXCISION IM NODE;  Surgeon: Rolm Bookbinder, MD;  Location: [facility];  Service: General;  Laterality: Left;  PEC BLOCK   CESAREAN SECTION WITH BILATERAL TUBAL LIGATION Bilateral 07/23/2015   Procedure: CESAREAN SECTION WITH BILATERAL TUBAL LIGATION;  Surgeon: Donnamae Jude, MD;  Location: [facility];  Service: Obstetrics;  Laterality: Bilateral;   PORTACATH PLACEMENT Right 02/11/2020   Procedure: INSERTION PORT-A-CATH WITH ULTRASOUND GUIDANCE;  Surgeon: Rolm Bookbinder, MD;  Location: [facility];  Service: General;  Laterality: Right;   TUBAL LIGATION      Family History  Problem Relation Age of Onset   Heart disease Maternal Grandmother    Social History:  reports that she has never smoked. She has never used smokeless tobacco. She reports that she does not drink alcohol and does not use drugs.  Allergies:  Allergies  Allergen Reactions   Doxorubicin Hcl Rash    Experience chest tightness, abdominal pain, and redness around lips during and shortly after doxorubicin bolus.     Medications Prior to Admission  Medication Sig Dispense Refill   albuterol (VENTOLIN HFA) 108 (90 Base) MCG/ACT inhaler Inhale 1-2  puffs into the lungs every 4 (four) hours as needed for wheezing or shortness of breath. 8 g 5   loratadine (CLARITIN) 10 MG tablet Take 1 tablet (10 mg total) by mouth daily. 90 tablet 4   omeprazole (PRILOSEC) 40 MG capsule Take 1 capsule (40 mg total) by mouth at bedtime. 30 capsule 0   tobramycin-dexamethasone (TOBRADEX) ophthalmic solution Place 1 drop into both eyes in the morning and at bedtime. 5 mL 0    No results found for this or any previous visit (from the past 48 hour(s)). No results found.  Review of Systems  All other systems reviewed and are negative.  Blood pressure 107/61, pulse 66, temperature 98.4 F (36.9 C), temperature source Oral, resp. rate 20, height '5\' 2"'$  (1.575 m), weight 88.3 kg, last menstrual period 01/11/2019, SpO2 100 %, unknown if currently breastfeeding. Physical Exam Constitutional:      Appearance: Normal appearance.  Cardiovascular:     Rate and Rhythm: Normal rate.  Pulmonary:     Effort: Pulmonary effort is normal.     Comments: Port in place Neurological:     Mental Status: She is alert.     Assessment/Plan Port removal  Rolm Bookbinder, MD 04/30/2021, 7:18 AM

## 2021-04-30 NOTE — Transfer of Care (Signed)
Immediate Anesthesia Transfer of Care Note  Patient: [name]  Procedure(s) Performed: REMOVAL PORT-A-CATH (Chest)  Patient Location: PACU  Anesthesia Type:MAC  Level of Consciousness: drowsy  Airway & Oxygen Therapy: Patient Spontanous Breathing and Patient connected to face mask oxygen  Post-op Assessment: Report given to RN, Post -op Vital signs reviewed and stable and Patient moving all extremities X 4  Post vital signs: Reviewed and stable  Last Vitals:  Vitals Value Taken Time  BP 116/78 04/30/21 0805  Temp    Pulse 79 04/30/21 0808  Resp 14 04/30/21 0808  SpO2 100 % 04/30/21 0808  Vitals shown include unvalidated device data.  Last Pain:  Vitals:   04/30/21 0644  TempSrc: Oral  PainSc: 0-No pain         Complications: No notable events documented.

## 2021-04-30 NOTE — Anesthesia Postprocedure Evaluation (Signed)
Anesthesia Post Note  Patient: [name]  Procedure(s) Performed: REMOVAL PORT-A-CATH (Chest)     Patient location during evaluation: PACU Anesthesia Type: MAC Level of consciousness: awake and alert, patient cooperative and oriented Pain management: pain level controlled Vital Signs Assessment: post-procedure vital signs reviewed and stable Respiratory status: nonlabored ventilation, spontaneous breathing and respiratory function stable Cardiovascular status: blood pressure returned to baseline and stable Postop Assessment: no apparent nausea or vomiting Anesthetic complications: no   No notable events documented.  Last Vitals:  Vitals:   04/30/21 0815 04/30/21 0848  BP: 116/78 122/69  Pulse: 70 82  Resp: 15 14  Temp:  37.1 C  SpO2: 96% 96%    Last Pain:  Vitals:   04/30/21 0848  TempSrc:   PainSc: 0-No pain                 Silvina Hackleman,E. Nichelle Renwick

## 2021-05-03 ENCOUNTER — Encounter (HOSPITAL_BASED_OUTPATIENT_CLINIC_OR_DEPARTMENT_OTHER): Payer: Self-pay | Admitting: General Surgery

## 2021-05-18 ENCOUNTER — Ambulatory Visit: Payer: Self-pay | Admitting: Physician Assistant

## 2021-05-18 ENCOUNTER — Other Ambulatory Visit: Payer: Self-pay

## 2021-05-18 VITALS — BP 127/78 | HR 81 | Temp 98.7°F | Resp 18 | Ht 62.0 in | Wt 191.0 lb

## 2021-05-18 DIAGNOSIS — J029 Acute pharyngitis, unspecified: Secondary | ICD-10-CM

## 2021-05-18 DIAGNOSIS — Z853 Personal history of malignant neoplasm of breast: Secondary | ICD-10-CM

## 2021-05-18 LAB — POC COVID19 BINAXNOW: SARS Coronavirus 2 Ag: NEGATIVE

## 2021-05-18 MED ORDER — AMOXICILLIN 500 MG PO CAPS: 500.0000 mg | ORAL_CAPSULE | Freq: Two times a day (BID) | ORAL | 0 refills | Status: AC

## 2021-05-18 MED ORDER — BENZONATATE 200 MG PO CAPS: 200.0000 mg | ORAL_CAPSULE | Freq: Two times a day (BID) | ORAL | 0 refills | Status: DC | PRN

## 2021-05-18 NOTE — Patient Instructions (Signed)
You will take amoxicillin twice a day for the next ten days.  I sent a medication for cough as well.  I hope that you feel better soon!  Kennieth Rad, PA-C Physician Assistant [facility] [email or web address]   Faringitis Pharyngitis La faringitis es inflamacin de la garganta (faringe). Es Ardelia Mems causa muy comn de dolor de Investment banker, operational. La faringitis puede ser causada por una bacteria, pero por lo general la provoca un virus. La mayora de los casos de faringitis se curan sin tratamiento. Cules son las causas? Esta afeccin puede ser causada por lo siguiente: Infeccin por virus (viral). La faringitis viral se contagia fcilmente de Ardelia Mems persona a otra (es contagiosa) al toser, estornudar y compartir objetos o utensilios personales como tazas, tenedores, cucharas, cepillos de dientes. Infeccin por bacterias (bacteriana). La faringitis bacteriana se puede contagiar al tocarse la nariz o cara luego de entrar en contacto con las bacterias, o a travs de un contacto cercano, como por ejemplo, al besarse. Alergias. Las alergias pueden causar una acumulacin de mucosidad en la garganta (goteo posnasal) que deriva en la inflamacin e irritacin. A su vez, las alergias pueden bloquear las fosas nasales, lo cual hace que se deba respirar por la boca, y esto seca e Youth worker. Qu incrementa el riesgo? Es ms probable que contraiga esta afeccin si: Fidel Levy 5 y 37 aos. Est en lugares muy concurridos, tales como guardera, escuela o vivir en una residencia estudiantil. Vive en un ambiente de clima fro. Tiene debilitado el sistema que combate las enfermedades (inmunitario). Cules son los signos o sntomas? Los sntomas de esta afeccin varan segn la causa. Los sntomas frecuentes de esta afeccin incluyen los siguientes: Dolor de Investment banker, operational. Fatiga. Fiebre no muy alta. Nariz tapada (congestin nasal) y tos. Dolor de  [place]. Otros sntomas pueden incluir lo siguiente: Ganglios en el cuello (ganglios linfticos) que estn hinchados. Erupciones cutneas. Pelcula parecida a las placas en la garganta o amgdalas. Por lo general, esto es un sntoma de faringitis bacteriana. Vmitos. Ojos rojos con picazn (conjuntivitis). Prdida del apetito. Dolores musculares y en las articulaciones. Amgdalas agrandadas. Cmo se diagnostica? Esta afeccin se puede diagnosticar en funcin de los antecedentes mdicos y un examen fsico. El mdico le har preguntas sobre la enfermedad y sus sntomas. Puede que se haga un cultivo de su garganta para buscar bacterias (prueba rpida para estreptococos). Tambin es posible que se realicen otros anlisis de laboratorio, segn la posible causa, aunque esto es poco comn. Cmo se trata? Muchas veces, no se requiere tratamiento para esta afeccin. La faringitis generalmente mejora en 3 o 4 das sin tratamiento. La faringitis bacteriana puede tratarse con antibiticos. Siga estas instrucciones en su casa: Medicamentos Use los medicamentos de venta libre y los recetados solamente como se lo haya indicado el mdico. Si le recetaron un antibitico, tmelo como se lo haya indicado el mdico. No deje de tomar el antibitico, aunque comience a Sports administrator. Use aerosoles para Airline pilot se lo haya indicado el mdico. Los nios pueden contraer faringitis. No le d aspirina al nio por el riesgo de que contraiga el sndrome de [place]. Control del dolor Para ayudar a Best boy, intente lo siguiente: Beba lquidos calientes, como caldos, infusiones o agua caliente. Tambin puede comer o beber lquidos fros o congelados, tales como paletas de hielo congelado. Haga grgaras con una mezcla de agua y sal 3 o 4 veces al da, o cuando sea necesario. Para preparar agua con sal, disuelva  totalmente de  a 1 cucharadita (de 3 a 6 g) de sal en 1 taza (237 ml) de agua  tibia. Chupe caramelos duros o pastillas para la garganta. Ponga un humidificador de vapor fro en la habitacin por la noche para humedecer el aire. Tambin puede abrir el agua caliente de la ducha y sentarse en el bao con la puerta cerrada durante 5 a 10 minutos.  Instrucciones generales  No consuma ningn producto que contenga nicotina o tabaco. Estos productos incluyen cigarrillos, tabaco para Higher education careers adviser y aparatos de vapeo, como los Psychologist, sport and exercise. Si necesita ayuda para dejar de fumar, consulte al mdico. Haga reposo como se lo haya indicado el mdico. Beber suficiente lquido como para Theatre manager la orina de color amarillo plido. Cmo se evita? Para ayudar a evitar infectarse o que se propague la infeccin: Lvese las manos frecuentemente con agua y jabn durante al menos 20 segundos. Use desinfectante para manos si no dispone de [place] y [place]. No se toque los ojos, la nariz o la boca sin haberse [employer], y [employer] manos despus de tocarse estas zonas. No comparta vasos ni utensilios para comer. Evite el contacto cercano con personas que estn enfermas. Comunquese con un mdico si: Tiene bultos grandes y dolorosos en el cuello. Tiene una erupcin cutnea. Tose con mucosidad de color verde, amarillo amarronado o con [place]. Solicite ayuda de inmediato si: El cuello se pone rgido. Comienza a babear o no puede tragar lquidos. No puede beber ni tomar medicamentos sin vomitar. Siente un dolor intenso que no se va, incluso luego de [employer]. Tiene dificultades para respirar, y no a causa de la congestin nasal. Experimenta un nuevo dolor e hinchazn de las articulaciones como las rodillas, tobillos, [place] o codos. Estos sntomas pueden representar un problema grave que constituye Engineer, maintenance (IT). No espere a ver si los sntomas desaparecen. Solicite atencin mdica de inmediato. Comunquese con el servicio de emergencias de su localidad (911 en los  Estados [place]). No conduzca por sus propios medios [employer] hospital. Resumen La faringitis ocurre cuando hay enrojecimiento, dolor e hinchazn (inflamacin) en la garganta (faringe). Si bien la faringitis puede ser causada por una bacteria, la causa ms comn son los virus. La mayora de los casos de faringitis se curan sin tratamiento. La faringitis bacteriana se trata con antibiticos. Esta informacin no tiene Marine scientist el consejo del mdico. Asegrese de hacerle al mdico cualquier pregunta que tenga. Document Revised: 12/24/2020 Document Reviewed: 12/24/2020 Elsevier Patient Education  [place].

## 2021-05-18 NOTE — Progress Notes (Signed)
Patient reports having a cough and sore throat for the past week. Since 05/07/21 with increased hoarseness, patient had conjunctivitis. Patient has eaten today. Patient has not taken medication today. Patient reports using hot tea with ginger and lime along with robitussin for symptoms with no relief. NO other person in her home are experiencing the symptoms. Patient complains of HA, Back pain.

## 2021-05-18 NOTE — Progress Notes (Signed)
New Patient Office Visit  Subjective:  Patient ID: [name], female    DOB: 1975-12-05  Age: 45 y.o. MRN: [id number]  CC:  Chief Complaint  Patient presents with   URI    HPI [name] states that she has felt poorly since 05/09/21, states that she has been having difficulty swallowing due to sore throat, dry cough, has started to become hoarse, does endorse unmeasured fever and conjunctivitis the first couple of days that has resolved.  Also complains of body aches and headache.  Reports that she has been drinking hot tea and using Robitussin without much relief.  Denies sick contacts, does endorse a significant history of recent chemotherapy treatment for breast cancer.  Due to language barrier, an interpreter was present during the history-taking and subsequent discussion (and for part of the physical exam) with this patient.   Past Medical History:  Diagnosis Date   Allergy    Asthma    Breast cancer in female [facility])    Left   Breast disorder    breast cancer May 2021   Chronic back pain    History of breast cancer    History of COVID-19 04/20/2021   Hx of migraines    Morbid obesity ([place])     Past Surgical History:  Procedure Laterality Date   BREAST LUMPECTOMY WITH RADIOACTIVE SEED AND SENTINEL LYMPH NODE BIOPSY Left 07/15/2020   Procedure: LEFT BREAST LUMPECTOMY WITH RADIOACTIVE SEED AND LEFT AXILLARY SENTINEL LYMPH NODE BIOPSY, LEFT AXILLARY NODE RADIOACTIVE SEED GUIDED EXCISION, LEFT BREAST RADIOACTIVE SEED GUIDED EXCISION IM NODE;  Surgeon: Rolm Bookbinder, MD;  Location: [place];  Service: General;  Laterality: Left;  PEC BLOCK   CESAREAN SECTION WITH BILATERAL TUBAL LIGATION Bilateral 07/23/2015   Procedure: CESAREAN SECTION WITH BILATERAL TUBAL LIGATION;  Surgeon: Donnamae Jude, MD;  Location: [facility];  Service: Obstetrics;  Laterality: Bilateral;   PORT-A-CATH REMOVAL N/A 04/30/2021   Procedure: REMOVAL PORT-A-CATH;  Surgeon: Rolm Bookbinder, MD;  Location: [place];  Service: General;  Laterality: N/A;   PORTACATH PLACEMENT Right 02/11/2020   Procedure: INSERTION PORT-A-CATH WITH ULTRASOUND GUIDANCE;  Surgeon: Rolm Bookbinder, MD;  Location: [place];  Service: General;  Laterality: Right;   TUBAL LIGATION      Family History  Problem Relation Age of Onset   Heart disease Maternal Grandmother     Social History   Socioeconomic History   Marital status: Legally Separated    Spouse name: Not on file   Number of children: 3   Years of education: Not on file   Highest education level: 9th grade  Occupational History   Occupation: Architect  Tobacco Use   Smoking status: Never   Smokeless tobacco: Never  Vaping Use   Vaping Use: Never used  Substance and Sexual Activity   Alcohol use: No   Drug use: Never   Sexual activity: Yes    Birth control/protection: Surgical  Other Topics Concern   Not on file  Social History Narrative   Not on file   Social Determinants of Health   Financial Resource Strain: Not on file  Food Insecurity: No Food Insecurity   Worried About Running Out of Food in the Last Year: Never true   Ran Out of Food in the Last Year: Never true  Transportation Needs: No Transportation Needs   Lack of Transportation (Medical): No   Lack of Transportation (Non-Medical): No  Physical Activity: Not on  file  Stress: Not on file  Social Connections: Not on file  Intimate Partner Violence: Not on file    ROS Review of Systems  Constitutional:  Positive for chills and fever.  HENT:  Positive for sore throat, trouble swallowing and voice change. Negative for congestion, ear pain, nosebleeds, postnasal drip and sinus pressure.   Eyes:  Positive for discharge.  Respiratory:  Positive for cough. Negative for shortness of breath and wheezing.   Cardiovascular:  Negative for chest pain.  Gastrointestinal:  Negative for abdominal pain, nausea and vomiting.   Endocrine: Negative.   Genitourinary: Negative.   Musculoskeletal:  Positive for myalgias.  Skin: Negative.   Allergic/Immunologic: Negative.   Neurological:  Positive for headaches.  Hematological: Negative.   Psychiatric/Behavioral: Negative.     Objective:   Today's Vitals: BP 127/78 (BP Location: Right Arm, Patient Position: Sitting, Cuff Size: Normal)   Pulse 81   Temp 98.7 F (37.1 C) (Oral)   Resp 18   Ht '5\' 2"'$  (1.575 m)   Wt 191 lb (86.6 kg)   LMP 01/20/2020 (Exact Date)   SpO2 98%   BMI 34.93 kg/m   Physical Exam Vitals and nursing note reviewed.  Constitutional:      Appearance: Normal appearance.  HENT:     Head: Normocephalic.     Salivary Glands: Right salivary gland is diffusely enlarged. Left salivary gland is diffusely enlarged.     Right Ear: Tympanic membrane, ear canal and external ear normal.     Left Ear: Tympanic membrane, ear canal and external ear normal.     Nose: Nose normal.     Right Turbinates: Not swollen.     Left Turbinates: Not swollen.     Right Sinus: No maxillary sinus tenderness or frontal sinus tenderness.     Left Sinus: No maxillary sinus tenderness or frontal sinus tenderness.     Mouth/Throat:     Lips: Pink.     Mouth: Mucous membranes are moist.     Pharynx: Posterior oropharyngeal erythema present.     Tonsils: No tonsillar exudate. 1+ on the right. 1+ on the left.  Eyes:     Extraocular Movements: Extraocular movements intact.     Conjunctiva/sclera: Conjunctivae normal.     Pupils: Pupils are equal, round, and reactive to light.  Cardiovascular:     Rate and Rhythm: Normal rate and regular rhythm.     Pulses: Normal pulses.     Heart sounds: Normal heart sounds.  Pulmonary:     Effort: Pulmonary effort is normal.     Breath sounds: Normal breath sounds. No wheezing.  Musculoskeletal:        General: Normal range of motion.     Cervical back: Normal range of motion and neck supple.  Lymphadenopathy:     Cervical:  Cervical adenopathy present.  Skin:    General: Skin is warm and dry.  Neurological:     General: No focal deficit present.     Mental Status: She is alert and oriented to person, place, and time.  Psychiatric:        Mood and Affect: Mood normal.        Behavior: Behavior normal.        Thought Content: Thought content normal.        Judgment: Judgment normal.    Assessment & Plan:   Problem List Items Addressed This Visit       Other   History of breast cancer  Other Visit Diagnoses     Acute pharyngitis, unspecified etiology    -  Primary   Relevant Medications   amoxicillin (AMOXIL) 500 MG capsule   benzonatate (TESSALON) 200 MG capsule   Other Relevant Orders   POC COVID-19 (Completed)       Outpatient Encounter Medications as of 05/18/2021  Medication Sig   albuterol (VENTOLIN HFA) 108 (90 Base) MCG/ACT inhaler Inhale 1-2 puffs into the lungs every 4 (four) hours as needed for wheezing or shortness of breath.   amoxicillin (AMOXIL) 500 MG capsule Take 1 capsule (500 mg total) by mouth 2 (two) times daily for 10 days.   benzonatate (TESSALON) 200 MG capsule Take 1 capsule (200 mg total) by mouth 2 (two) times daily as needed for cough.   loratadine (CLARITIN) 10 MG tablet Take 1 tablet (10 mg total) by mouth daily. (Patient not taking: Reported on 05/18/2021)   omeprazole (PRILOSEC) 40 MG capsule Take 1 capsule (40 mg total) by mouth at bedtime. (Patient not taking: Reported on 05/18/2021)   tobramycin-dexamethasone (TOBRADEX) ophthalmic solution Place 1 drop into both eyes in the morning and at bedtime. (Patient not taking: Reported on 05/18/2021)   No facility-administered encounter medications on file as of 05/18/2021.   1. Acute pharyngitis, unspecified etiology Rapid COVID test negative, no POC strep testing available.  Trial amoxicillin, Tessalon Perles, patient education given on supportive care, red flags for prompt reevaluation. - POC COVID-19 - amoxicillin  (AMOXIL) 500 MG capsule; Take 1 capsule (500 mg total) by mouth 2 (two) times daily for 10 days.  Dispense: 20 capsule; Refill: 0 - benzonatate (TESSALON) 200 MG capsule; Take 1 capsule (200 mg total) by mouth 2 (two) times daily as needed for cough.  Dispense: 20 capsule; Refill: 0   I have reviewed the patient's medical history (PMH, PSH, Social History, Family History, Medications, and allergies) , and have been updated if relevant. I spent 30 minutes reviewing chart and  face to face time with patient.   Follow-up: Return if symptoms worsen or fail to improve.   Loraine Grip Mayers, PA-C

## 2021-05-19 DIAGNOSIS — J029 Acute pharyngitis, unspecified: Secondary | ICD-10-CM | POA: Insufficient documentation

## 2021-05-28 ENCOUNTER — Telehealth: Payer: Self-pay | Admitting: Oncology

## 2021-05-28 NOTE — Telephone Encounter (Signed)
R/s appt per 9/16 sch msg. Pt is aware.

## 2021-06-02 ENCOUNTER — Ambulatory Visit: Payer: No Typology Code available for payment source | Admitting: Family Medicine

## 2021-06-03 ENCOUNTER — Inpatient Hospital Stay: Payer: No Typology Code available for payment source | Admitting: Oncology

## 2021-06-03 ENCOUNTER — Inpatient Hospital Stay: Payer: No Typology Code available for payment source

## 2021-07-26 ENCOUNTER — Ambulatory Visit: Payer: No Typology Code available for payment source | Attending: Oncology

## 2021-07-26 DIAGNOSIS — Z483 Aftercare following surgery for neoplasm: Secondary | ICD-10-CM | POA: Insufficient documentation

## 2021-08-06 ENCOUNTER — Other Ambulatory Visit: Payer: Self-pay

## 2021-08-06 DIAGNOSIS — C50412 Malignant neoplasm of upper-outer quadrant of left female breast: Secondary | ICD-10-CM

## 2021-08-08 IMAGING — MR MR BREAST BILAT WO/W CM
6 of 9 series · 29 of 48 positions shown · IV contrast (gadavist)
Comparison: 02/13/2020 breast MR and prior studies

CLINICAL DATA: 44-year-old female for evaluation of response of
LEFT breast cancer/LEFT axillary lymph node metastases to
neoadjuvant chemotherapy.

LABS:  None performed today
EXAM:
BILATERAL BREAST MRI WITH AND WITHOUT CONTRAST
TECHNIQUE: Multiplanar, multisequence MR images of both breasts were obtained
prior to and following the intravenous administration of 9 ml of
Gadavist

[Series 2: T2 · axial · 3.0mm · 0.94mm/px · z∈[-77,+85]mm · 4 of 55 slices shown]
[im 1/55]
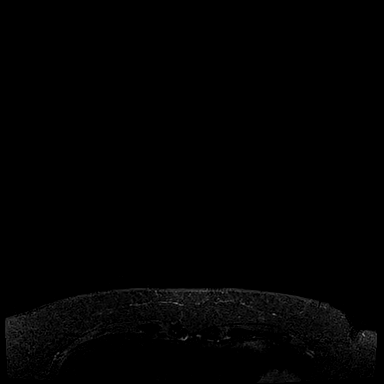
[im 19/55]
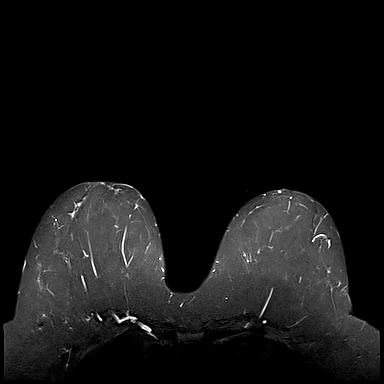
[im 37/55]
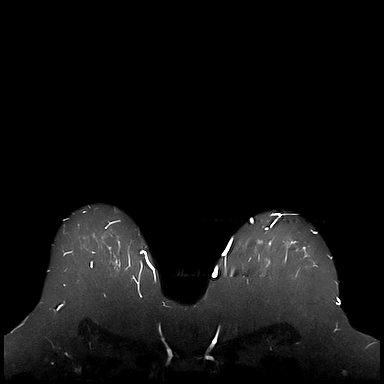
[im 55/55]
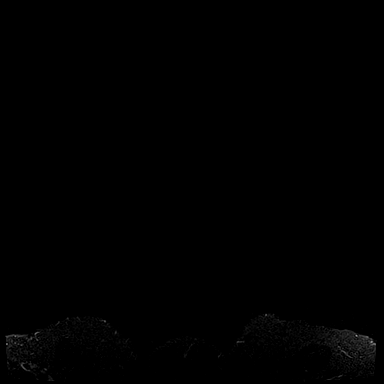

[Series 3: T1 fat-sat · axial · 1.2mm · 0.80mm/px · z∈[-83,+88]mm · 8 of 144 slices shown (1 of 4)]
[im 1/144]
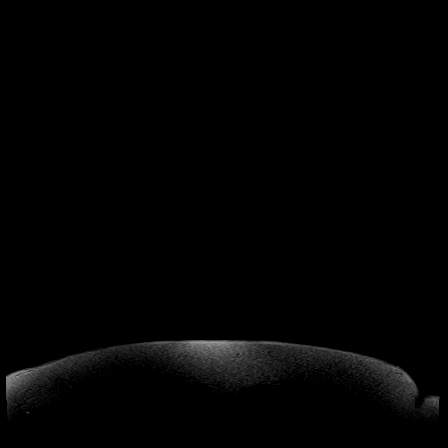
[im 21/144]
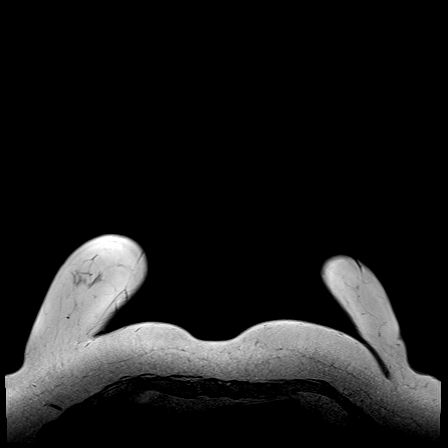
[im 41/144]
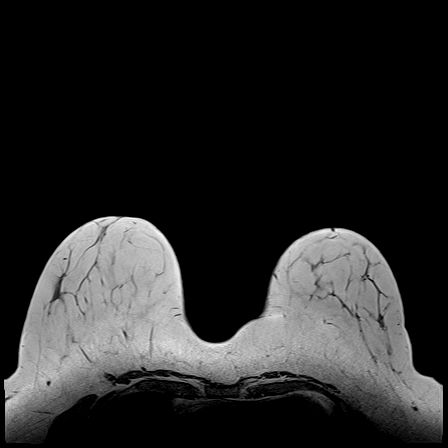
[im 62/144]
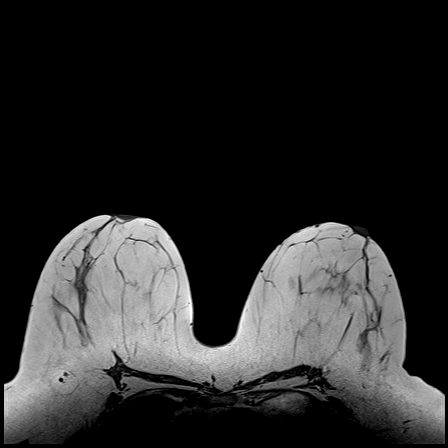
[im 82/144]
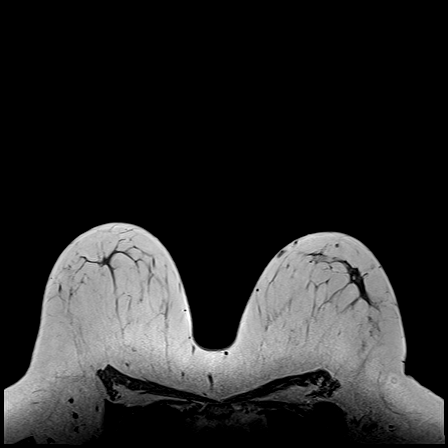
[im 103/144]
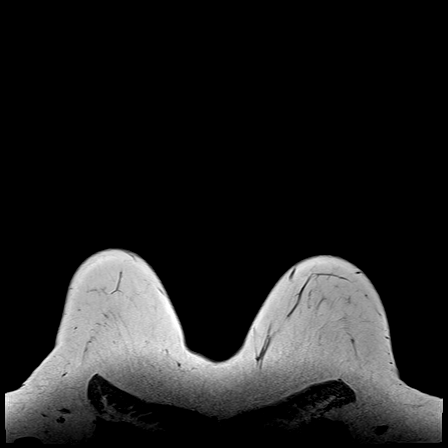
[im 123/144]
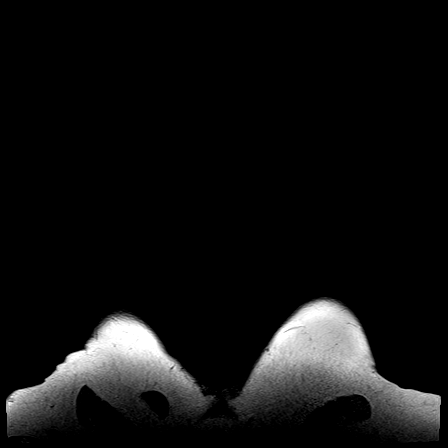
[im 144/144]
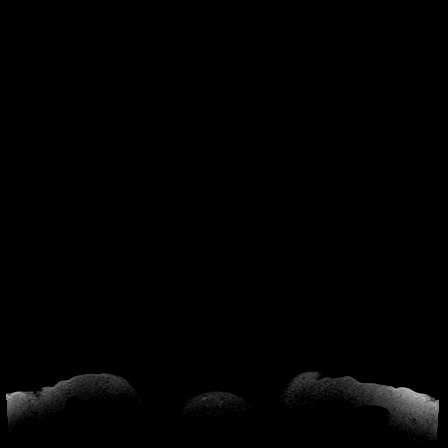

[Series 7: T1 fat-sat · axial · 1.6mm · 0.87mm/px · z∈[-89,+89]mm · 6 of 112 slices shown (2 of 4)]
[im 1/112]
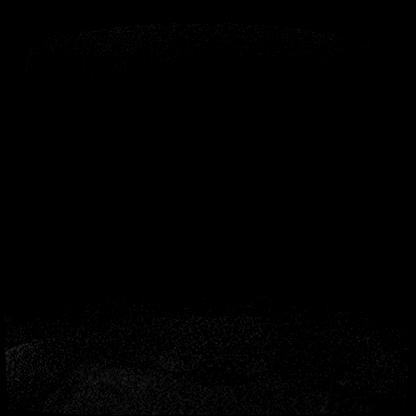
[im 23/112]
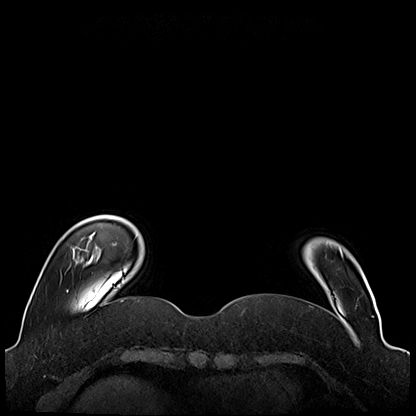
[im 45/112]
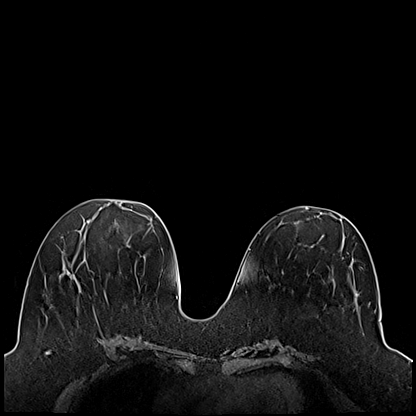
[im 67/112]
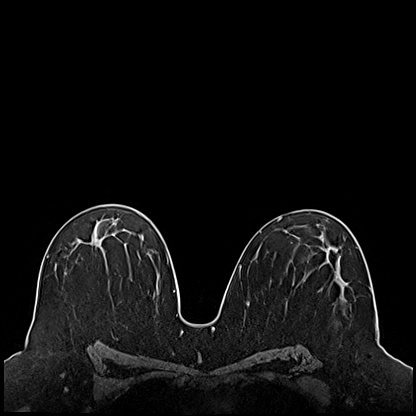
[im 89/112]
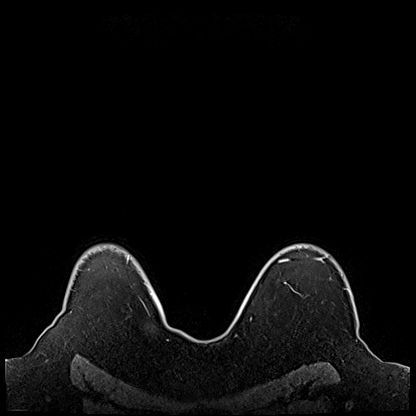
[im 112/112]
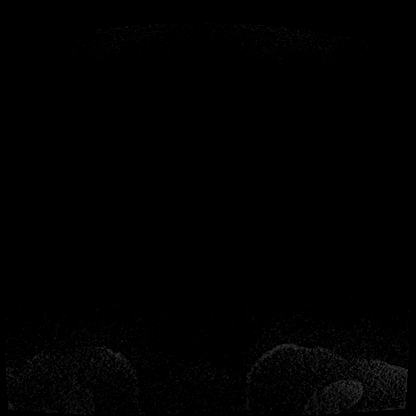

[Series 8: T1 fat-sat · axial · 1.6mm · 0.87mm/px · z∈[-89,+89]mm · 5 of 112 slices shown (3 of 4)]
[im 1/112]
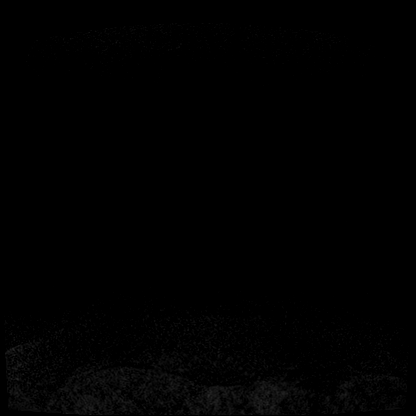
[im 28/112]
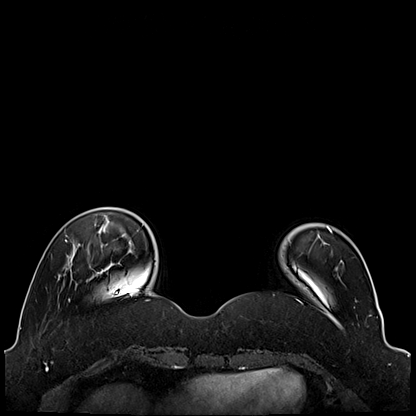
[im 56/112]
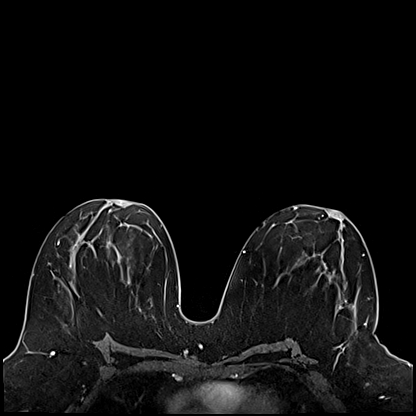
[im 84/112]
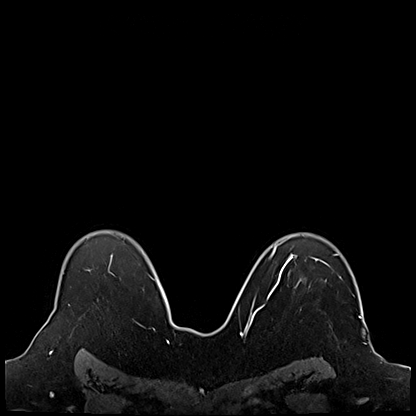
[im 112/112]
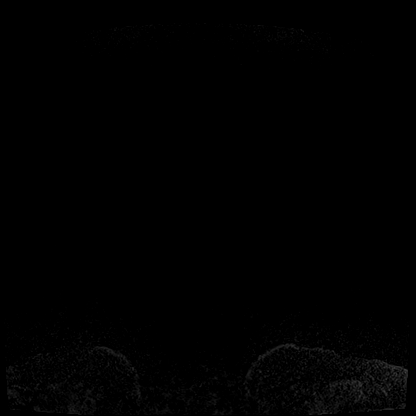

[Series 9: T1 · axial · 1.6mm · 0.87mm/px · z∈[-89,+89]mm · 5 of 112 slices shown]
[im 1/112]
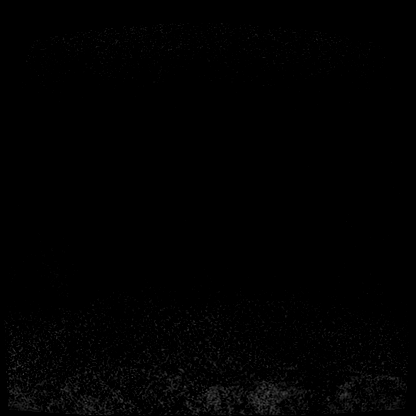
[im 28/112]
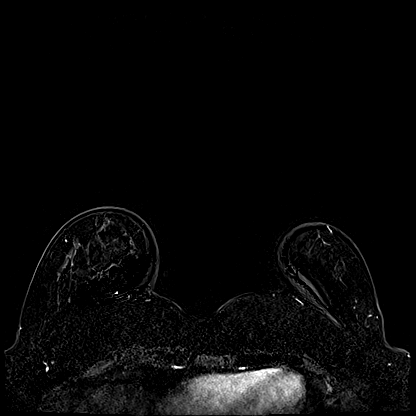
[im 56/112]
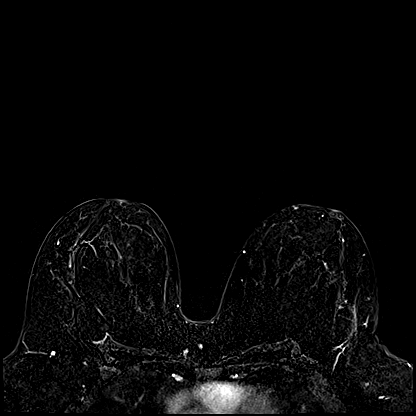
[im 84/112]
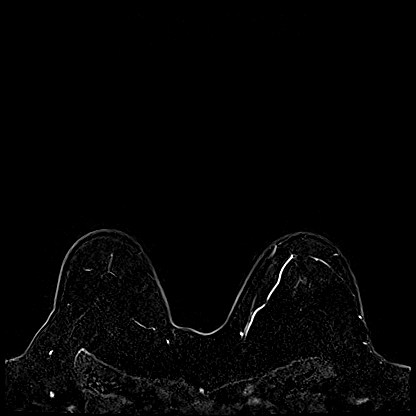
[im 112/112]
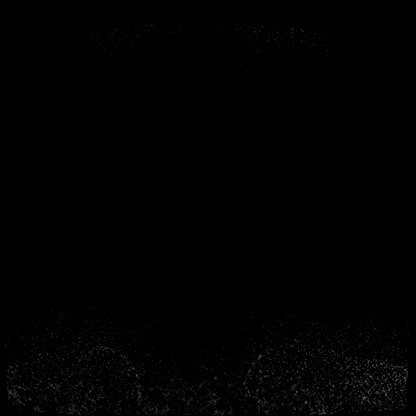

[Series 12: T1 fat-sat · axial · 1.6mm · 0.87mm/px · 1 of 112 slices shown (4 of 4)]
[im 1/112]
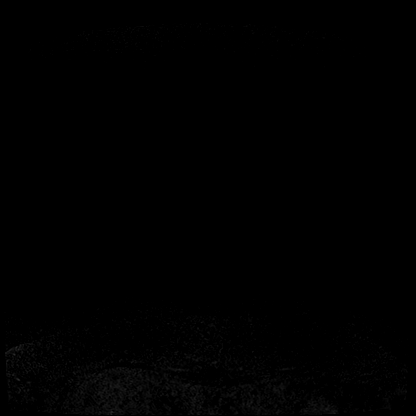

[29 of 48 positions shown; findings below may reference images not displayed]

Three-dimensional MR images were rendered by post-processing of the
original MR data on an independent workstation. The
three-dimensional MR images were interpreted, and findings are
reported in the following complete MRI report for this study. Three
dimensional images were evaluated at the independent interpreting
workstation using the DynaCAD thin client.
FINDINGS: Breast composition: b. Scattered fibroglandular tissue.

Background parenchymal enhancement: Mild

Right breast: No mass or abnormal enhancement.

Left breast: The biopsy-proven UPPER OUTER LEFT breast malignancy
now measures 1 x 0.8 x 1 cm (series 13: Image 40), previously 6 x
3.1 x 2.3 cm. Biopsy clip artifact is a difficult to identify on
this study.

No new or suspicious LEFT breast abnormalities are noted.

Lymph nodes: No abnormal lymph nodes are identified. The abnormal
appearing LEFT axillary lymph nodes on the prior study now have a
normal appearance.

Ancillary findings:  None.
IMPRESSION: 1. Treatment response with UPPER-OUTER LEFT breast malignancy now
measuring 1 x 0.8 x 1 cm, previously 6 x 3.1 x 2.3 cm on 02/13/2020.
2. Treatment response with previously identified abnormal LEFT
axillary lymph nodes now with a normal appearance.
3. No MR evidence of RIGHT breast malignancy.

RECOMMENDATION:
Treatment plan

BI-RADS CATEGORY  6: Known biopsy-proven malignancy.

## 2021-08-08 NOTE — Progress Notes (Signed)
[place]  [phone] [phone]     ID: [name] DOB: December 04, 1975  MR#: [id number]  YDX#:[id number]  Patient Care Team: Jacelyn Pi, Lilia Argue, MD as PCP - General (Family Medicine) Rolm Bookbinder, MD as Consulting Physician (General Surgery) Addysin Porco, Virgie Dad, MD as Consulting Physician (Oncology) Eppie Gibson, MD as Attending Physician (Radiation Oncology) Rockwell Germany, RN as Oncology Nurse Navigator Mauro Kaufmann, RN as Oncology Nurse Navigator Chauncey Cruel, MD OTHER MD:  CHIEF COMPLAINT: Triple negative breast cancer  CURRENT TREATMENT: Observation   INTERVAL HISTORY:  [name] returns today for follow up of her triple negative breast cancer. She is now under observation.  She had her port removed on 04/30/2021.  That went without event  An order is in place for a bone density screening, but this is not yet scheduled.  She says she was called, but she did not have her "orange card".  By the time she got the orange card the appointment time had been passed and they never called her back she said  We are following her TSH: Lab Results  Component Value Date   TSH 0.644 01/25/2021   TSH 1.149 01/13/2021   TSH 1.720 01/04/2021   TSH 3.924 12/14/2020   TSH 2.500 09/25/2020    REVIEW OF SYSTEMS: Cameka has a persistent rash in the left flank area which is not itchy and not palpable.  She just wanted me to check it out.  Otherwise a detailed review of systems today was entirely benign   COVID 19 VACCINATION STATUS: She had the J&J vaccine x1, infection 06/20/2020, and Pfizer booster on 08/19/2020   HISTORY OF CURRENT ILLNESS: From the original intake note:  [name] herself palpated a left beast lump with associated pain. She underwent bilateral diagnostic mammography with tomography and left breast ultrasonography at [facility] on 01/23/2020 showing: breast density category B; palpable 3.6 cm left  breast mass with associated calcifications at 1 o'clock; 0.8 cm left breast mass at 2 o'clock, concerning for abnormal intramammary lymph node; at least two abnormal left axillary lymph nodes.  Accordingly on 01/23/2020 she proceeded to biopsy of the left breast area in question. The pathology from this procedure ([id number]) showed: invasive ductal carcinoma at 1 o'clock, grade 3. Prognostic indicators significant for: estrogen receptor, 20% positive with moderate staining intensity and progesterone receptor, 0% negative. Proliferation marker Ki67 at 40%. HER2 negative by immunohistochemistry (1+).  The left axillary lymph node confirmed metastatic carcinoma.  The left intramammary lymph node was negative for carcinoma.  The patient's subsequent history is as detailed below.   PAST MEDICAL HISTORY: Past Medical History:  Diagnosis Date   Allergy    Asthma    Breast cancer in female [facility])    Left   Breast disorder    breast cancer May 2021   Chronic back pain    History of breast cancer    History of COVID-19 04/20/2021   Hx of migraines    Morbid obesity ([place])     PAST SURGICAL HISTORY: Past Surgical History:  Procedure Laterality Date   BREAST LUMPECTOMY WITH RADIOACTIVE SEED AND SENTINEL LYMPH NODE BIOPSY Left 07/15/2020   Procedure: LEFT BREAST LUMPECTOMY WITH RADIOACTIVE SEED AND LEFT AXILLARY SENTINEL LYMPH NODE BIOPSY, LEFT AXILLARY NODE RADIOACTIVE SEED GUIDED EXCISION, LEFT BREAST RADIOACTIVE SEED GUIDED EXCISION IM NODE;  Surgeon: Rolm Bookbinder, MD;  Location: [place];  Service: General;  Laterality: Left;  PEC BLOCK  CESAREAN SECTION WITH BILATERAL TUBAL LIGATION Bilateral 07/23/2015   Procedure: CESAREAN SECTION WITH BILATERAL TUBAL LIGATION;  Surgeon: Donnamae Jude, MD;  Location: [facility];  Service: Obstetrics;  Laterality: Bilateral;   PORT-A-CATH REMOVAL N/A 04/30/2021   Procedure: REMOVAL PORT-A-CATH;  Surgeon: Rolm Bookbinder, MD;  Location: [place];  Service: General;  Laterality: N/A;   PORTACATH PLACEMENT Right 02/11/2020   Procedure: INSERTION PORT-A-CATH WITH ULTRASOUND GUIDANCE;  Surgeon: Rolm Bookbinder, MD;  Location: [place];  Service: General;  Laterality: Right;   TUBAL LIGATION      FAMILY HISTORY: Family History  Problem Relation Age of Onset   Heart disease Maternal Grandmother    Her parents are both living as of 01/2020, her father age 29 and her mother age 35. She has three maternal half-siblings (1 sister, 2 brothers) and four paternal half-siblings (1 sister, 3 brothers). There is no family history of cancer to her knowledge.   GYNECOLOGIC HISTORY:  Patient's last menstrual period was 01/20/2020 (exact date). Menarche: 45 years old Age at first live birth: 45 years old [place] P 3 LMP 01/2020 Contraceptive s/p tubal ligation HRT n/a  Hysterectomy? no BSO? 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  Husband Vaughan Sine works for a Financial trader, both in [employer]. They are both from [place]. She lives at home with [name] and their three children-- [name], [name], and [name]. She attends an [employer].    ADVANCED DIRECTIVES: In the absence of any documentation to the contrary, the patient's spouse is their HCPOA.    HEALTH MAINTENANCE: Social History   Tobacco Use   Smoking status: Never   Smokeless tobacco: Never  Vaping Use   Vaping Use: Never used  Substance Use Topics   Alcohol use: No   Drug use: Never     Colonoscopy: n/a (age)  PAP: 12/2020  Bone density: n/a (age)   Allergies  Allergen Reactions   Doxorubicin Hcl Rash    Experience chest tightness, abdominal pain, and redness around lips during and shortly after doxorubicin bolus.     Current Outpatient Medications  Medication Sig Dispense Refill   albuterol  (VENTOLIN HFA) 108 (90 Base) MCG/ACT inhaler Inhale 1-2 puffs into the lungs every 4 (four) hours as needed for wheezing or shortness of breath. 8 g 5   benzonatate (TESSALON) 200 MG capsule Take 1 capsule (200 mg total) by mouth 2 (two) times daily as needed for cough. 20 capsule 0   loratadine (CLARITIN) 10 MG tablet Take 1 tablet (10 mg total) by mouth daily. (Patient not taking: Reported on 05/18/2021) 90 tablet 4   omeprazole (PRILOSEC) 40 MG capsule Take 1 capsule (40 mg total) by mouth at bedtime. (Patient not taking: Reported on 05/18/2021) 30 capsule 0   tobramycin-dexamethasone (TOBRADEX) ophthalmic solution Place 1 drop into both eyes in the morning and at bedtime. (Patient not taking: Reported on 05/18/2021) 5 mL 0   No current facility-administered medications for this visit.    OBJECTIVE: Spanish speaker in no acute distress  Vitals:   08/09/21 1244  BP: 128/66  Pulse: 70  Resp: 16  Temp: 97.7 F (36.5 C)  SpO2: 100%     Body mass index is 34.35 kg/m.   Wt Readings from Last 3 Encounters:  08/09/21 187 lb 12.8 oz (85.2 kg)  05/18/21 191 lb (86.6  kg)  04/30/21 194 lb 10.7 oz (88.3 kg)     ECOG FS:1 - Symptomatic but completely ambulatory  Sclerae unicteric, EOMs intact Wearing a mask No cervical or supraclavicular adenopathy Lungs no rales or rhonchi Heart regular rate and rhythm Abd soft, nontender, positive bowel sounds MSK no focal spinal tenderness, no upper extremity lymphedema Neuro: nonfocal, well oriented, appropriate affect Breasts: The right breast is unremarkable.  The left breast has undergone lumpectomy and radiation.  There is no evidence of local recurrence.  Both axillae are benign Skin: In the left flank posterior area there are 2 or 3 poorly defined slightly erythematous areas which blanch easily, are nonpalpable, and not itchy.   LAB RESULTS:  CMP     Component Value Date/Time   NA 140 01/25/2021 1042   NA 138 08/15/2017 1525   K 3.8 01/25/2021  1042   CL 107 01/25/2021 1042   CO2 27 01/25/2021 1042   GLUCOSE 110 (H) 01/25/2021 1042   BUN 10 01/25/2021 1042   BUN 12 08/15/2017 1525   CREATININE 0.70 01/25/2021 1042   CREATININE 0.78 01/13/2021 1305   CALCIUM 9.4 01/25/2021 1042   PROT 6.7 01/25/2021 1042   PROT 7.4 01/03/2017 1642   ALBUMIN 3.5 01/25/2021 1042   ALBUMIN 4.3 01/03/2017 1642   AST 24 01/25/2021 1042   AST 24 01/13/2021 1305   ALT 23 01/25/2021 1042   ALT 24 01/13/2021 1305   ALKPHOS 100 01/25/2021 1042   BILITOT 0.3 01/25/2021 1042   BILITOT 0.4 01/13/2021 1305   GFRNONAA >60 01/25/2021 1042   GFRNONAA >60 01/13/2021 1305   GFRAA >60 06/03/2020 0949   GFRAA >60 02/26/2020 0915    No results found for: TOTALPROTELP, ALBUMINELP, A1GS, A2GS, BETS, BETA2SER, GAMS, MSPIKE, SPEI  Lab Results  Component Value Date   WBC 2.7 (L) 01/25/2021   NEUTROABS 1.3 (L) 01/25/2021   HGB 11.9 (L) 01/25/2021   HCT 36.2 01/25/2021   MCV 84.8 01/25/2021   PLT 174 01/25/2021    No results found for: LABCA2  No components found for: UXYBFX832  No results for input(s): INR in the last 168 hours.  No results found for: LABCA2  No results found for: NVB166  No results found for: MAY045  No results found for: TXH741  No results found for: CA2729  No components found for: HGQUANT  No results found for: CEA1 / No results found for: CEA1   No results found for: AFPTUMOR  No results found for: CHROMOGRNA  No results found for: KPAFRELGTCHN, LAMBDASER, KAPLAMBRATIO (kappa/lambda light chains)  No results found for: HGBA, HGBA2QUANT, HGBFQUANT, HGBSQUAN (Hemoglobinopathy evaluation)   No results found for: LDH  No results found for: IRON, TIBC, IRONPCTSAT (Iron and TIBC)  No results found for: FERRITIN  Urinalysis No results found for: COLORURINE, APPEARANCEUR, LABSPEC, PHURINE, GLUCOSEU, HGBUR, BILIRUBINUR, KETONESUR, PROTEINUR, UROBILINOGEN, NITRITE, LEUKOCYTESUR   STUDIES: No results  found.   ELIGIBLE FOR AVAILABLE RESEARCH PROTOCOL: [phone]?  ASSESSMENT: 45 y.o. [place] woman status post left breast upper outer quadrant biopsy 01/23/2020 for a clinical T3 N1, 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  (a) chest CT scan and bone scan 02/07/2020 showed no evidence of metastatic disease  (1) neoadjuvant chemotherapy consisting of doxorubicin and cyclophosphamide in dose dense fashion x4 started 02/12/2020, completed 03/25/2020, followed by paclitaxel and carboplatin weekly x12 started 04/08/2020 and completed on 05/28/2020  (a) echo 02/07/2020 shows an ejection fraction in the  55-60% range  (b) Epirubicin substituted for Doxorubicin starting with cycle 2 of AC secondary to allergic rash from Doxorubicin  (2) status post left lumpectomy and sentinel lymph node sampling 07/15/2020 for a ypT1b ypN0 residual invasive ductal carcinoma, grade 3, with negative margins.  (a) a total of 4 left axillary lymph nodes were removed  (b) repeat prognostic panel on the final pathology again triple negative, with an MIB-1 of 50%.  (3) adjuvant radiation completed 10/09/2020  (a) sensitizing capecitabine attempted but not tolerated by the patient  (4) genetics testing 02/07/2020 through the [facility] Common Hereditary Cancers Panel found no deleterious mutations in  APC, ATM, AXIN2, BARD1, BMPR1A, BRCA1, BRCA2, BRIP1, CDH1, CDKN2A (p14ARF), CDKN2A (p16INK4a), CKD4, CHEK2, CTNNA1, DICER1, EPCAM (Deletion/duplication testing only), GREM1 (promoter region deletion/duplication testing only), KIT, MEN1, MLH1, MSH2, MSH3, MSH6, MUTYH, NBN, NF1, NHTL1, PALB2, PDGFRA, PMS2, POLD1, POLE, PTEN, RAD50, RAD51C, RAD51D, RNF43, SDHB, SDHC, SDHD, SMAD4, SMARCA4. STK11, TP53, TSC1, TSC2, and VHL.  The following genes were evaluated for sequence changes only: SDHA and HOXB13 c.251G>A variant only.  (a) VUS in MSH6 called c.2744C>G identified.   (5)   adjuvant pembrolizumab started 11/23/2020, to continue for 1 year  (a) discontinued after 01/04/2021 dose with multiple side effects requiring steroids for resolution   PLAN: Severina is now a year out from definitive surgery for her breast cancer with no evidence of disease activity.  This is very favorable.  I think the rash that she is experiencing on her left flank is going to be related to her earlier pembrolizumab dose.  It is not itchy or bothersome and it certainly appears benign so we are not putting her on steroids at this point.  If there are any significant changes she will let us know  She asked what I thought her risk of the cancer coming back was and I think her lifetime of this cancer returning is in the 20% range.  The good news of course is she has a 80% or perhaps slightly more chance of remaining cancer free long-term.  I would like to treat her with zoledronate every 6 months for 2 years.  I have set her up for a bone density scan to be done at the same time as her next mammogram in June.  We can then discuss zoledronate after that when she returns to see [place] in July  She knows to contact us for any other issue that may develop before then  Total encounter time 20 minutes.Sarajane Jews C. Hallelujah Wysong, MD 08/09/21 12:45 PM Medical Oncology and Hematology [facility] [place], [place] [address] Tel. [phone]    Fax. [phone]   I, Wilburn Mylar, am acting as scribe for Dr. Virgie Dad. Mozelle Remlinger.  I, Lurline Del MD, have reviewed the above documentation for accuracy and completeness, and I agree with the above.   *Total Encounter Time as defined by the Centers for Medicare and Medicaid Services includes, in addition to the face-to-face time of a patient visit (documented in the note above) non-face-to-face time: obtaining and reviewing outside history, ordering and reviewing medications, tests or procedures, care coordination  (communications with other health care professionals or caregivers) and documentation in the medical record.

## 2021-08-09 ENCOUNTER — Other Ambulatory Visit: Payer: Self-pay

## 2021-08-09 ENCOUNTER — Inpatient Hospital Stay: Payer: No Typology Code available for payment source | Attending: Oncology | Admitting: Oncology

## 2021-08-09 ENCOUNTER — Inpatient Hospital Stay: Payer: No Typology Code available for payment source

## 2021-08-09 ENCOUNTER — Other Ambulatory Visit: Payer: Self-pay | Admitting: Oncology

## 2021-08-09 ENCOUNTER — Inpatient Hospital Stay: Payer: Self-pay

## 2021-08-09 ENCOUNTER — Telehealth: Payer: Self-pay | Admitting: Oncology

## 2021-08-09 VITALS — BP 128/66 | HR 70 | Temp 97.7°F | Resp 16 | Ht 62.0 in | Wt 187.8 lb

## 2021-08-09 DIAGNOSIS — R21 Rash and other nonspecific skin eruption: Secondary | ICD-10-CM | POA: Insufficient documentation

## 2021-08-09 DIAGNOSIS — C50412 Malignant neoplasm of upper-outer quadrant of left female breast: Secondary | ICD-10-CM

## 2021-08-09 DIAGNOSIS — Z170 Estrogen receptor positive status [ER+]: Secondary | ICD-10-CM

## 2021-08-09 DIAGNOSIS — Z923 Personal history of irradiation: Secondary | ICD-10-CM | POA: Insufficient documentation

## 2021-08-09 DIAGNOSIS — Z79899 Other long term (current) drug therapy: Secondary | ICD-10-CM | POA: Insufficient documentation

## 2021-08-09 DIAGNOSIS — E669 Obesity, unspecified: Secondary | ICD-10-CM | POA: Insufficient documentation

## 2021-08-09 DIAGNOSIS — Z853 Personal history of malignant neoplasm of breast: Secondary | ICD-10-CM | POA: Insufficient documentation

## 2021-08-09 DIAGNOSIS — Z8616 Personal history of COVID-19: Secondary | ICD-10-CM | POA: Insufficient documentation

## 2021-08-09 LAB — CBC WITH DIFFERENTIAL (CANCER CENTER ONLY)
Abs Immature Granulocytes: 0.01 10*3/uL (ref 0.00–0.07)
Basophils Absolute: 0 10*3/uL (ref 0.0–0.1)
Basophils Relative: 0 %
Eosinophils Absolute: 0.1 10*3/uL (ref 0.0–0.5)
Eosinophils Relative: 1 %
HCT: 37.3 % (ref 36.0–46.0)
Hemoglobin: 12.4 g/dL (ref 12.0–15.0)
Immature Granulocytes: 0 %
Lymphocytes Relative: 42 %
Lymphs Abs: 2 10*3/uL (ref 0.7–4.0)
MCH: 28.2 pg (ref 26.0–34.0)
MCHC: 33.2 g/dL (ref 30.0–36.0)
MCV: 85 fL (ref 80.0–100.0)
Monocytes Absolute: 0.3 10*3/uL (ref 0.1–1.0)
Monocytes Relative: 7 %
Neutro Abs: 2.3 10*3/uL (ref 1.7–7.7)
Neutrophils Relative %: 50 %
Platelet Count: 204 10*3/uL (ref 150–400)
RBC: 4.39 MIL/uL (ref 3.87–5.11)
RDW: 13.5 % (ref 11.5–15.5)
WBC Count: 4.6 10*3/uL (ref 4.0–10.5)
nRBC: 0 % (ref 0.0–0.2)

## 2021-08-09 LAB — CMP (CANCER CENTER ONLY)
ALT: 15 U/L (ref 0–44)
AST: 16 U/L (ref 15–41)
Albumin: 3.7 g/dL (ref 3.5–5.0)
Alkaline Phosphatase: 116 U/L (ref 38–126)
Anion gap: 8 (ref 5–15)
BUN: 16 mg/dL (ref 6–20)
CO2: 24 mmol/L (ref 22–32)
Calcium: 9.2 mg/dL (ref 8.9–10.3)
Chloride: 107 mmol/L (ref 98–111)
Creatinine: 0.75 mg/dL (ref 0.44–1.00)
GFR, Estimated: >60 mL/min (ref >60)
Glucose, Bld: 101 mg/dL — ABNORMAL HIGH (ref 70–99)
Potassium: 3.8 mmol/L (ref 3.5–5.1)
Sodium: 139 mmol/L (ref 135–145)
Total Bilirubin: 0.2 mg/dL — ABNORMAL LOW (ref 0.3–1.2)
Total Protein: 7.1 g/dL (ref 6.5–8.1)

## 2021-08-09 LAB — TSH: TSH: 6.964 u[IU]/mL — ABNORMAL HIGH (ref 0.308–3.960)

## 2021-08-09 NOTE — Progress Notes (Signed)
I called [name] to let her know her thyroid was not working entirely correctly and we need to repeat the labs.  I put in a lab request for August 23, 2021.  I will call her with those results

## 2021-08-09 NOTE — Telephone Encounter (Signed)
Scheduled appointment per 11/28 los. Patient is aware. 

## 2021-08-10 ENCOUNTER — Telehealth: Payer: Self-pay | Admitting: Oncology

## 2021-08-10 NOTE — Telephone Encounter (Signed)
Scheduled per sch msg. Called with interpreter. Left msg

## 2021-08-20 ENCOUNTER — Other Ambulatory Visit: Payer: Self-pay

## 2021-08-20 DIAGNOSIS — Z170 Estrogen receptor positive status [ER+]: Secondary | ICD-10-CM

## 2021-08-20 DIAGNOSIS — C50412 Malignant neoplasm of upper-outer quadrant of left female breast: Secondary | ICD-10-CM

## 2021-08-23 ENCOUNTER — Other Ambulatory Visit: Payer: Self-pay

## 2021-08-23 ENCOUNTER — Inpatient Hospital Stay: Payer: No Typology Code available for payment source | Attending: Oncology

## 2021-08-23 DIAGNOSIS — Z79899 Other long term (current) drug therapy: Secondary | ICD-10-CM | POA: Insufficient documentation

## 2021-08-23 DIAGNOSIS — C50412 Malignant neoplasm of upper-outer quadrant of left female breast: Secondary | ICD-10-CM

## 2021-08-23 DIAGNOSIS — Z170 Estrogen receptor positive status [ER+]: Secondary | ICD-10-CM

## 2021-08-23 DIAGNOSIS — Z853 Personal history of malignant neoplasm of breast: Secondary | ICD-10-CM | POA: Insufficient documentation

## 2021-08-23 LAB — CMP (CANCER CENTER ONLY)
ALT: 15 U/L (ref 0–44)
AST: 16 U/L (ref 15–41)
Albumin: 3.9 g/dL (ref 3.5–5.0)
Alkaline Phosphatase: 123 U/L (ref 38–126)
Anion gap: 11 (ref 5–15)
BUN: 14 mg/dL (ref 6–20)
CO2: 23 mmol/L (ref 22–32)
Calcium: 9.3 mg/dL (ref 8.9–10.3)
Chloride: 107 mmol/L (ref 98–111)
Creatinine: 0.81 mg/dL (ref 0.44–1.00)
GFR, Estimated: >60 mL/min (ref >60)
Glucose, Bld: 108 mg/dL — ABNORMAL HIGH (ref 70–99)
Potassium: 3.8 mmol/L (ref 3.5–5.1)
Sodium: 141 mmol/L (ref 135–145)
Total Bilirubin: 0.2 mg/dL — ABNORMAL LOW (ref 0.3–1.2)
Total Protein: 7.6 g/dL (ref 6.5–8.1)

## 2021-08-23 LAB — CBC WITH DIFFERENTIAL (CANCER CENTER ONLY)
Abs Immature Granulocytes: 0.01 10*3/uL (ref 0.00–0.07)
Basophils Absolute: 0 10*3/uL (ref 0.0–0.1)
Basophils Relative: 0 %
Eosinophils Absolute: 0.2 10*3/uL (ref 0.0–0.5)
Eosinophils Relative: 3 %
HCT: 38.3 % (ref 36.0–46.0)
Hemoglobin: 12.9 g/dL (ref 12.0–15.0)
Immature Granulocytes: 0 %
Lymphocytes Relative: 36 %
Lymphs Abs: 2.2 10*3/uL (ref 0.7–4.0)
MCH: 28.7 pg (ref 26.0–34.0)
MCHC: 33.7 g/dL (ref 30.0–36.0)
MCV: 85.3 fL (ref 80.0–100.0)
Monocytes Absolute: 0.4 10*3/uL (ref 0.1–1.0)
Monocytes Relative: 6 %
Neutro Abs: 3.4 10*3/uL (ref 1.7–7.7)
Neutrophils Relative %: 55 %
Platelet Count: 202 10*3/uL (ref 150–400)
RBC: 4.49 MIL/uL (ref 3.87–5.11)
RDW: 13.5 % (ref 11.5–15.5)
WBC Count: 6.2 10*3/uL (ref 4.0–10.5)
nRBC: 0 % (ref 0.0–0.2)

## 2021-08-24 LAB — TSH: TSH: 4.191 u[IU]/mL — ABNORMAL HIGH (ref 0.308–3.960)

## 2021-10-01 ENCOUNTER — Other Ambulatory Visit: Payer: Self-pay

## 2021-10-01 ENCOUNTER — Encounter: Payer: Self-pay | Admitting: Nurse Practitioner

## 2021-10-01 ENCOUNTER — Ambulatory Visit (INDEPENDENT_AMBULATORY_CARE_PROVIDER_SITE_OTHER): Payer: Self-pay | Admitting: Nurse Practitioner

## 2021-10-01 VITALS — BP 123/64 | HR 70 | Resp 16 | Wt 189.2 lb

## 2021-10-01 DIAGNOSIS — R52 Pain, unspecified: Secondary | ICD-10-CM

## 2021-10-01 DIAGNOSIS — R053 Chronic cough: Secondary | ICD-10-CM

## 2021-10-01 DIAGNOSIS — R5383 Other fatigue: Secondary | ICD-10-CM

## 2021-10-01 DIAGNOSIS — R0789 Other chest pain: Secondary | ICD-10-CM

## 2021-10-01 DIAGNOSIS — R0602 Shortness of breath: Secondary | ICD-10-CM

## 2021-10-01 DIAGNOSIS — R071 Chest pain on breathing: Secondary | ICD-10-CM

## 2021-10-01 DIAGNOSIS — Z0000 Encounter for general adult medical examination without abnormal findings: Secondary | ICD-10-CM

## 2021-10-01 DIAGNOSIS — T07XXXA Unspecified multiple injuries, initial encounter: Secondary | ICD-10-CM

## 2021-10-01 LAB — POCT GLYCOSYLATED HEMOGLOBIN (HGB A1C): HbA1c, POC (controlled diabetic range): 5.6 % (ref 0.0–7.0)

## 2021-10-01 NOTE — Progress Notes (Signed)
Pt states she got lab work done for her thyroid and test was not right/abnormal

## 2021-10-01 NOTE — Patient Instructions (Signed)
You were seen today in the [facility] to establish care. Labs were collected, results will be available via MyChart or, if abnormal, you will be contacted by clinic staff. You were prescribed medications, please take as directed. Please follow up in 2 mths for reevaluation of symptoms.

## 2021-10-01 NOTE — Progress Notes (Signed)
[place] [address], [place]  [address] Phone:  [phone]   Fax:  [phone] Subjective:   Patient ID: [name], female    DOB: 22-Oct-1975, 46 y.o.   MRN: [id number]  Chief Complaint  Patient presents with   Establish Care   HPI [name] 46 y.o. female  has a past medical history of Allergy, Asthma, Breast cancer in female [facility]), Breast disorder, Chronic back pain, History of breast cancer, History of COVID-19 (04/20/2021), migraines, and Morbid obesity ([place]). To the [facility] to establish care, last PCP visit 2 yrs ago.  Patient states that she is concerned about cough x 3 mths, non productive. Has had some shortness of breath intermittently throughout the day with activity and at rest. Was prescribed cough syrup, with no improvement in symptoms.   Also verbalizes pain in left chest that radiates to left upper back, was informed that it was related to past radiation for breast cancer. Last visit with oncologist 07/30/21, completed labs, but never received results. Chest pain increases with deep palpation. Rates pain 5/10 and describes as stabbing. Have not taken any medications for symptoms.   Patient has history of left breast cancer, which she completed chemotherapy, surgery and radiation. Completed treatment plan, 3/22, currently in remission and under observation by Oncologist. Was supposed to complete one year of chemotherapy, but is was discontinued after two rounds due to complications.   Other complaints that patient has today, are diffuse body aches and fatigue since completion of chemotherapy and radiation. Patient has also had red spots to left side that has been worsening for the last 6 mths, no associated symptoms, pain and/ itching.  Currently unemployed, monitors meals and makes effort to exercise during the week. Denies any other complaints today. Denies any fever. Denies any fatigue, chest pain, shortness of  breath, HA or dizziness. Denies any blurred vision, numbness or tingling.   Past Medical History:  Diagnosis Date   Allergy    Asthma    Breast cancer in female [facility])    Left   Breast disorder    breast cancer May 2021   Chronic back pain    History of breast cancer    History of COVID-19 04/20/2021   Hx of migraines    Morbid obesity ([place])     Past Surgical History:  Procedure Laterality Date   BREAST LUMPECTOMY WITH RADIOACTIVE SEED AND SENTINEL LYMPH NODE BIOPSY Left 07/15/2020   Procedure: LEFT BREAST LUMPECTOMY WITH RADIOACTIVE SEED AND LEFT AXILLARY SENTINEL LYMPH NODE BIOPSY, LEFT AXILLARY NODE RADIOACTIVE SEED GUIDED EXCISION, LEFT BREAST RADIOACTIVE SEED GUIDED EXCISION IM NODE;  Surgeon: Rolm Bookbinder, MD;  Location: [place];  Service: General;  Laterality: Left;  PEC BLOCK   CESAREAN SECTION WITH BILATERAL TUBAL LIGATION Bilateral 07/23/2015   Procedure: CESAREAN SECTION WITH BILATERAL TUBAL LIGATION;  Surgeon: Donnamae Jude, MD;  Location: [facility];  Service: Obstetrics;  Laterality: Bilateral;   PORT-A-CATH REMOVAL N/A 04/30/2021   Procedure: REMOVAL PORT-A-CATH;  Surgeon: Rolm Bookbinder, MD;  Location: [facility];  Service: General;  Laterality: N/A;   PORTACATH PLACEMENT Right 02/11/2020   Procedure: INSERTION PORT-A-CATH WITH ULTRASOUND GUIDANCE;  Surgeon: Rolm Bookbinder, MD;  Location: [place];  Service: General;  Laterality: Right;   TUBAL LIGATION      Family History  Problem Relation Age of Onset   Heart disease Maternal Grandmother     Social History  Socioeconomic History   Marital status: Legally Separated    Spouse name: Not on file   Number of children: 3   Years of education: Not on file   Highest education level: 9th grade  Occupational History   Occupation: Architect  Tobacco Use   Smoking status: Never   Smokeless tobacco: Never  Vaping Use   Vaping Use: Never used  Substance and Sexual Activity    Alcohol use: No   Drug use: Never   Sexual activity: Yes    Birth control/protection: Surgical  Other Topics Concern   Not on file  Social History Narrative   Not on file   Social Determinants of Health   Financial Resource Strain: Not on file  Food Insecurity: No Food Insecurity   Worried About Running Out of Food in the Last Year: Never true   [place] in the Last Year: Never true  Transportation Needs: No Transportation Needs   Lack of Transportation (Medical): No   Lack of Transportation (Non-Medical): No  Physical Activity: Not on file  Stress: Not on file  Social Connections: Not on file  Intimate Partner Violence: Not on file    Outpatient Medications Prior to Visit  Medication Sig Dispense Refill   albuterol (VENTOLIN HFA) 108 (90 Base) MCG/ACT inhaler Inhale 1-2 puffs into the lungs every 4 (four) hours as needed for wheezing or shortness of breath. (Patient not taking: Reported on 10/01/2021) 8 g 5   benzonatate (TESSALON) 200 MG capsule Take 1 capsule (200 mg total) by mouth 2 (two) times daily as needed for cough. (Patient not taking: Reported on 10/01/2021) 20 capsule 0   loratadine (CLARITIN) 10 MG tablet Take 1 tablet (10 mg total) by mouth daily. (Patient not taking: Reported on 05/18/2021) 90 tablet 4   omeprazole (PRILOSEC) 40 MG capsule Take 1 capsule (40 mg total) by mouth at bedtime. (Patient not taking: Reported on 05/18/2021) 30 capsule 0   tobramycin-dexamethasone (TOBRADEX) ophthalmic solution Place 1 drop into both eyes in the morning and at bedtime. (Patient not taking: Reported on 05/18/2021) 5 mL 0   No facility-administered medications prior to visit.    Allergies  Allergen Reactions   Doxorubicin Hcl Rash    Experience chest tightness, abdominal pain, and redness around lips during and shortly after doxorubicin bolus.     Review of Systems  Constitutional:  Positive for malaise/fatigue. Negative for chills and fever.  HENT: Negative.     Eyes: Negative.   Respiratory:  Positive for cough and shortness of breath. Negative for hemoptysis, sputum production and wheezing.   Cardiovascular:  Positive for chest pain. Negative for palpitations and leg swelling.  Gastrointestinal: Negative.  Negative for abdominal pain, blood in stool, constipation, diarrhea, nausea and vomiting.  Genitourinary: Negative.   Musculoskeletal:  Positive for myalgias. Negative for back pain, falls, joint pain and neck pain.  Skin: Negative.   Neurological: Negative.   Psychiatric/Behavioral:  Negative for depression. The patient is not nervous/anxious.   All other systems reviewed and are negative.     Objective:    Physical Exam Constitutional:      General: She is not in acute distress.    Appearance: Normal appearance. She is obese.  HENT:     Head: Normocephalic.     Right Ear: Tympanic membrane, ear canal and external ear normal. There is no impacted cerumen.     Left Ear: Tympanic membrane, ear canal and external ear normal. There is no impacted cerumen.  Nose: Nose normal. No congestion or rhinorrhea.     Mouth/Throat:     Mouth: Mucous membranes are moist.     Pharynx: Oropharynx is clear. No oropharyngeal exudate or posterior oropharyngeal erythema.  Eyes:     Extraocular Movements: Extraocular movements intact.     Conjunctiva/sclera: Conjunctivae normal.     Pupils: Pupils are equal, round, and reactive to light.  Neck:     Vascular: No carotid bruit.  Cardiovascular:     Rate and Rhythm: Normal rate and regular rhythm.     Pulses: Normal pulses.     Heart sounds: Normal heart sounds.     Comments: No obvious peripheral edema Pulmonary:     Effort: Pulmonary effort is normal.     Breath sounds: Normal breath sounds.  Chest:     Chest wall: No mass, lacerations, deformity, swelling, tenderness, crepitus or edema. There is no dullness to percussion.  Abdominal:     General: Abdomen is flat. Bowel sounds are normal. There  is no distension.     Palpations: Abdomen is soft. There is no mass.     Tenderness: There is no abdominal tenderness. There is no right CVA tenderness, left CVA tenderness, guarding or rebound.     Hernia: No hernia is present.  Musculoskeletal:        General: No swelling, tenderness, deformity or signs of injury. Normal range of motion.     Cervical back: Normal range of motion and neck supple. No rigidity or tenderness.     Right lower leg: No edema.     Left lower leg: No edema.  Lymphadenopathy:     Cervical: No cervical adenopathy.  Skin:    General: Skin is warm and dry.     Capillary Refill: Capillary refill takes less than 2 seconds.     Findings: Bruising present.       Neurological:     General: No focal deficit present.     Mental Status: She is alert and oriented to person, place, and time.  Psychiatric:        Mood and Affect: Mood normal.        Behavior: Behavior normal.        Thought Content: Thought content normal.        Judgment: Judgment normal.    BP 123/64    Pulse 70    Resp 16    Wt 189 lb 3.2 oz (85.8 kg)    LMP 01/20/2020 (Exact Date)    SpO2 100%    BMI 34.61 kg/m  Wt Readings from Last 3 Encounters:  10/01/21 189 lb 3.2 oz (85.8 kg)  08/09/21 187 lb 12.8 oz (85.2 kg)  05/18/21 191 lb (86.6 kg)    Immunization History  Administered Date(s) Administered   Influenza,inj,Quad PF,6+ Mos 11/01/2016   Janssen (J&J) SARS-COV-2 Vaccination 01/31/2020   PFIZER(Purple Top)SARS-COV-2 Vaccination 08/19/2020   Unspecified SARS-COV-2 Vaccination 11/25/2020    Diabetic Foot Exam - Simple   No data filed     Lab Results  Component Value Date   TSH 4.191 (H) 08/23/2021   Lab Results  Component Value Date   WBC 6.2 08/23/2021   HGB 12.9 08/23/2021   HCT 38.3 08/23/2021   MCV 85.3 08/23/2021   PLT 202 08/23/2021   Lab Results  Component Value Date   NA 141 08/23/2021   K 3.8 08/23/2021   CO2 23 08/23/2021   GLUCOSE 108 (H) 08/23/2021   BUN  14 08/23/2021  CREATININE 0.81 08/23/2021   BILITOT 0.2 (L) 08/23/2021   ALKPHOS 123 08/23/2021   AST 16 08/23/2021   ALT 15 08/23/2021   PROT 7.6 08/23/2021   ALBUMIN 3.9 08/23/2021   CALCIUM 9.3 08/23/2021   ANIONGAP 11 08/23/2021   No results found for: CHOL No results found for: HDL No results found for: LDLCALC No results found for: TRIG No results found for: CHOLHDL No results found for: HGBA1C     Assessment & Plan:   Problem List Items Addressed This Visit   None Visit Diagnoses     Healthcare maintenance    -  Primary   Relevant Orders   POCT glycosylated hemoglobin (Hb A1C) Encouraged continued diet and exercise efforts  Encouraged continued compliance with medication     Other fatigue       Relevant Orders   CBC with Differential/Platelet   Comprehensive metabolic panel   Lipid panel   TSH+T4F+T3Free   Body aches       Relevant Orders   CBC with Differential/Platelet   Comprehensive metabolic panel   Lipid panel   TSH+T4F+T3Free Informed to take OTC medications as needed for pain    Other chest pain       Relevant Orders   CT Angio Chest Pulmonary Embolism (PE) W or WO Contrast   Chronic cough       Relevant Orders   CT Angio Chest Pulmonary Embolism (PE) W or WO Contrast Continue taking previously prescribed medications as needed    Shortness of breath       Relevant Orders   CT Angio Chest Pulmonary Embolism (PE) W or WO Contrast   Chest pain on breathing       Relevant Orders   CT Angio Chest Pulmonary Embolism (PE) W or WO Contrast   Follow up in 2 mths for reevaluation of symptoms, sooner as needed     I am having [name] maintain her loratadine, albuterol, omeprazole, tobramycin-dexamethasone, and benzonatate.  No orders of the defined types were placed in this encounter.    Teena Dunk, NP

## 2021-10-02 LAB — COMPREHENSIVE METABOLIC PANEL
ALT: 16 IU/L (ref 0–32)
AST: 14 IU/L (ref 0–40)
Albumin/Globulin Ratio: 1.8 (ref 1.2–2.2)
Albumin: 4.5 g/dL (ref 3.8–4.8)
Alkaline Phosphatase: 113 IU/L (ref 44–121)
BUN/Creatinine Ratio: 14 (ref 9–23)
BUN: 10 mg/dL (ref 6–24)
Bilirubin Total: 0.3 mg/dL (ref 0.0–1.2)
CO2: 22 mmol/L (ref 20–29)
Calcium: 9.6 mg/dL (ref 8.7–10.2)
Chloride: 104 mmol/L (ref 96–106)
Creatinine, Ser: 0.73 mg/dL (ref 0.57–1.00)
Globulin, Total: 2.5 g/dL (ref 1.5–4.5)
Glucose: 96 mg/dL (ref 70–99)
Potassium: 4.5 mmol/L (ref 3.5–5.2)
Sodium: 141 mmol/L (ref 134–144)
Total Protein: 7 g/dL (ref 6.0–8.5)
eGFR: 103 mL/min/{1.73_m2} (ref >59)

## 2021-10-02 LAB — LIPID PANEL
Chol/HDL Ratio: 5.4 ratio — ABNORMAL HIGH (ref 0.0–4.4)
Cholesterol, Total: 253 mg/dL — ABNORMAL HIGH (ref 100–199)
HDL: 47 mg/dL (ref >39)
LDL Chol Calc (NIH): 176 mg/dL — ABNORMAL HIGH (ref 0–99)
Triglycerides: 165 mg/dL — ABNORMAL HIGH (ref 0–149)
VLDL Cholesterol Cal: 30 mg/dL (ref 5–40)

## 2021-10-02 LAB — CBC WITH DIFFERENTIAL/PLATELET
Basophils Absolute: 0 10*3/uL (ref 0.0–0.2)
Basos: 1 %
EOS (ABSOLUTE): 0.1 10*3/uL (ref 0.0–0.4)
Eos: 2 %
Hematocrit: 40.4 % (ref 34.0–46.6)
Hemoglobin: 13.3 g/dL (ref 11.1–15.9)
Immature Grans (Abs): 0 10*3/uL (ref 0.0–0.1)
Immature Granulocytes: 0 %
Lymphocytes Absolute: 1.6 10*3/uL (ref 0.7–3.1)
Lymphs: 40 %
MCH: 28.5 pg (ref 26.6–33.0)
MCHC: 32.9 g/dL (ref 31.5–35.7)
MCV: 87 fL (ref 79–97)
Monocytes Absolute: 0.3 10*3/uL (ref 0.1–0.9)
Monocytes: 6 %
Neutrophils Absolute: 2 10*3/uL (ref 1.4–7.0)
Neutrophils: 51 %
Platelets: 212 10*3/uL (ref 150–450)
RBC: 4.67 x10E6/uL (ref 3.77–5.28)
RDW: 13.3 % (ref 11.7–15.4)
WBC: 3.9 10*3/uL (ref 3.4–10.8)

## 2021-10-02 LAB — TSH+T4F+T3FREE
Free T4: 1.1 ng/dL (ref 0.82–1.77)
T3, Free: 3.3 pg/mL (ref 2.0–4.4)
TSH: 3.96 u[IU]/mL (ref 0.450–4.500)

## 2021-10-04 ENCOUNTER — Other Ambulatory Visit: Payer: Self-pay | Admitting: Nurse Practitioner

## 2021-10-04 DIAGNOSIS — E7849 Other hyperlipidemia: Secondary | ICD-10-CM

## 2021-10-04 MED ORDER — ATORVASTATIN CALCIUM 40 MG PO TABS: 40.0000 mg | ORAL_TABLET | Freq: Every day | ORAL | 5 refills | Status: DC

## 2021-10-05 ENCOUNTER — Telehealth: Payer: Self-pay

## 2021-10-05 NOTE — Telephone Encounter (Signed)
Patient viewed previous labs results in my chart

## 2021-10-05 NOTE — Telephone Encounter (Signed)
-----   Message from Teena Dunk, NP sent at 10/04/2021  9:52 AM EST ----- Labs significant for elevated cholesterol, medication ordered. Will continue to encourage changes in diet and exercise, increased fruits and vegetables, exercise 30 minutes/ day x 5 days. Will continue to monitor. All other labs wnl. Patient notified by support staff.

## 2021-10-18 ENCOUNTER — Other Ambulatory Visit: Payer: Self-pay

## 2021-10-18 ENCOUNTER — Ambulatory Visit (HOSPITAL_COMMUNITY)
Admission: RE | Admit: 2021-10-18 | Discharge: 2021-10-18 | Disposition: A | Discharge status: Home / Self Care | Payer: No Typology Code available for payment source | Source: Ambulatory Visit | Attending: Nurse Practitioner | Admitting: Nurse Practitioner

## 2021-10-18 ENCOUNTER — Encounter (HOSPITAL_COMMUNITY): Payer: Self-pay

## 2021-10-18 DIAGNOSIS — R0789 Other chest pain: Secondary | ICD-10-CM | POA: Insufficient documentation

## 2021-10-18 DIAGNOSIS — R053 Chronic cough: Secondary | ICD-10-CM | POA: Insufficient documentation

## 2021-10-18 DIAGNOSIS — R0602 Shortness of breath: Secondary | ICD-10-CM | POA: Insufficient documentation

## 2021-10-18 DIAGNOSIS — R071 Chest pain on breathing: Secondary | ICD-10-CM | POA: Insufficient documentation

## 2021-10-18 MED ORDER — SODIUM CHLORIDE (PF) 0.9 % IJ SOLN
INTRAMUSCULAR | Status: AC
  Filled 2021-10-18: qty 50

## 2021-10-18 MED ORDER — IOHEXOL 350 MG/ML SOLN
80.0000 mL | Freq: Once | INTRAVENOUS | Status: AC | PRN
  Administered 2021-10-18: 80 mL via INTRAVENOUS

## 2021-10-18 NOTE — Progress Notes (Incomplete)
Post contrast injection patient complained of itching in her throat. Dr. Kris Hartmann assessed patient. Blood pressure 143/93. Patients complaint resolved. She was observed for 30 minutes and felt back to normal when leaving. Interpreter onsite utilized.

## 2021-10-22 ENCOUNTER — Other Ambulatory Visit: Payer: Self-pay | Admitting: Hematology and Oncology

## 2021-10-22 DIAGNOSIS — Z170 Estrogen receptor positive status [ER+]: Secondary | ICD-10-CM

## 2021-10-22 DIAGNOSIS — C50412 Malignant neoplasm of upper-outer quadrant of left female breast: Secondary | ICD-10-CM

## 2021-10-26 ENCOUNTER — Encounter (HOSPITAL_COMMUNITY): Payer: Self-pay

## 2021-11-29 ENCOUNTER — Encounter: Payer: Self-pay | Admitting: Nurse Practitioner

## 2021-11-29 ENCOUNTER — Telehealth: Payer: Self-pay

## 2021-11-29 ENCOUNTER — Ambulatory Visit (INDEPENDENT_AMBULATORY_CARE_PROVIDER_SITE_OTHER): Payer: Self-pay | Admitting: Nurse Practitioner

## 2021-11-29 ENCOUNTER — Other Ambulatory Visit: Payer: Self-pay

## 2021-11-29 VITALS — BP 125/57 | HR 65 | Temp 97.6°F | Ht 60.0 in | Wt 188.6 lb

## 2021-11-29 DIAGNOSIS — H538 Other visual disturbances: Secondary | ICD-10-CM

## 2021-11-29 DIAGNOSIS — G8929 Other chronic pain: Secondary | ICD-10-CM

## 2021-11-29 DIAGNOSIS — R350 Frequency of micturition: Secondary | ICD-10-CM

## 2021-11-29 DIAGNOSIS — E559 Vitamin D deficiency, unspecified: Secondary | ICD-10-CM

## 2021-11-29 DIAGNOSIS — R5383 Other fatigue: Secondary | ICD-10-CM

## 2021-11-29 DIAGNOSIS — M546 Pain in thoracic spine: Secondary | ICD-10-CM

## 2021-11-29 LAB — POCT URINALYSIS DIP (CLINITEK)
Bilirubin, UA: NEGATIVE
Blood, UA: NEGATIVE
Glucose, UA: NEGATIVE mg/dL
Ketones, POC UA: NEGATIVE mg/dL
Leukocytes, UA: NEGATIVE
Nitrite, UA: NEGATIVE
POC PROTEIN,UA: NEGATIVE
Spec Grav, UA: 1.02 (ref 1.010–1.025)
Urobilinogen, UA: 0.2 E.U./dL
pH, UA: 5.5 (ref 5.0–8.0)

## 2021-11-29 MED ORDER — CYCLOBENZAPRINE HCL 10 MG PO TABS
10.0000 mg | ORAL_TABLET | Freq: Three times a day (TID) | ORAL | 0 refills | Status: DC | PRN
Start: 2021-11-29 — End: 2022-06-28

## 2021-11-29 MED ORDER — NAPROXEN 500 MG PO TABS: 500.0000 mg | ORAL_TABLET | Freq: Two times a day (BID) | ORAL | 0 refills | Status: AC

## 2021-11-29 NOTE — Telephone Encounter (Signed)
Pt called and for got to ask for a DENTAL referral since she has the orange card. ? ?She speak spanish and wants to know the info. ?

## 2021-11-29 NOTE — Progress Notes (Signed)
? ?[facility] ?[place], [place]  [address] ?Phone:  [phone]   Fax:  [phone] ?Subjective:  ? Patient ID: [name], female    DOB: 10-04-1975, 46 y.o.   MRN: [id number] ? ?Chief Complaint  ?Patient presents with  ? Follow-up  ?  Pt stated she has back pain and swollen eyes and fatigue. Pt stated since the chemotherapy she has blurry vision and eyes hurts  ? ?HPI ?[name] 46 y.o. female  has a past medical history of Allergy, Asthma, Breast cancer in female [facility]), Breast disorder, Chronic back pain, History of breast cancer, History of COVID-19 (04/20/2021), migraines, and Morbid obesity ([place]). To the [facility] for reevaluation of symptoms.  ? ?Patient currently concerned about swelling to bilateral eyes. Has had swelling to bilateral lower eyelid x 2-3 mths. Both sides affected and at times only one side. Has also had blurry vision since initial chemotherapy, but now suspects that she may be losing her eye site. Was evaluated by oncologist and informed her eye exam is normal, but it feels different. States that she suspects she is still losing her vision, because she has to hold things up far away to be able to read. Has not been evaluated by an eye doctor in the past. Denies any pain, only increased pressure when straining to read.  ? ?Continues to have increased fatigue since previous visit. Also continues to have mid upper back pain, worsens in the morning. Has been taking tylenol and ibuprofen with no improvement in pain. Endorses having back pain for longer than 1 yr. Currently rates pain 8/10, increasing with prolonged standing and/ chores such as sweeping. Describes pain as burning .  ? ?Also concerned about increased urinary frequency at night. States that she has had urinary frequency and burning with urination x 3-4 wks. Suspect that she may have a UTI. Denies any recent evaluation or treatment of symptoms.  ? ?Denies any other concerns  today. Denies any  chest pain, shortness of breath, HA or dizziness. Denies any numbness or tingling. ? ? ?Past Medical History:  ?Diagnosis Date  ? Allergy   ? Asthma   ? Breast cancer in female [facility])   ? Left  ? Breast disorder   ? breast cancer May 2021  ? Chronic back pain   ? History of breast cancer   ? History of COVID-19 04/20/2021  ? Hx of migraines   ? Morbid obesity ([place])   ? ? ?Past Surgical History:  ?Procedure Laterality Date  ? BREAST LUMPECTOMY WITH RADIOACTIVE SEED AND SENTINEL LYMPH NODE BIOPSY Left 07/15/2020  ? Procedure: LEFT BREAST LUMPECTOMY WITH RADIOACTIVE SEED AND LEFT AXILLARY SENTINEL LYMPH NODE BIOPSY, LEFT AXILLARY NODE RADIOACTIVE SEED GUIDED EXCISION, LEFT BREAST RADIOACTIVE SEED GUIDED EXCISION IM NODE;  Surgeon: Rolm Bookbinder, MD;  Location: [place];  Service: General;  Laterality: Left;  PEC BLOCK  ? CESAREAN SECTION WITH BILATERAL TUBAL LIGATION Bilateral 07/23/2015  ? Procedure: CESAREAN SECTION WITH BILATERAL TUBAL LIGATION;  Surgeon: Donnamae Jude, MD;  Location: [facility];  Service: Obstetrics;  Laterality: Bilateral;  ? PORT-A-CATH REMOVAL N/A 04/30/2021  ? Procedure: REMOVAL PORT-A-CATH;  Surgeon: Rolm Bookbinder, MD;  Location: [place];  Service: General;  Laterality: N/A;  ? PORTACATH PLACEMENT Right 02/11/2020  ? Procedure: INSERTION PORT-A-CATH WITH ULTRASOUND GUIDANCE;  Surgeon: Rolm Bookbinder, MD;  Location: [place];  Service: General;  Laterality: Right;  ? TUBAL LIGATION    ? ? ?  Family History  ?Problem Relation Age of Onset  ? Heart disease Maternal Grandmother   ? ? ?Social History  ? ?Socioeconomic History  ? Marital status: Legally Separated  ?  Spouse name: Not on file  ? Number of children: 3  ? Years of education: Not on file  ? Highest education level: 9th grade  ?Occupational History  ? Occupation: Architect  ?Tobacco Use  ? Smoking status: Never  ? Smokeless tobacco: Never  ?Vaping Use  ? Vaping Use: Never used   ?Substance and Sexual Activity  ? Alcohol use: No  ? Drug use: Never  ? Sexual activity: Yes  ?  Birth control/protection: Surgical  ?Other Topics Concern  ? Not on file  ?Social History Narrative  ? Not on file  ? ?Social Determinants of Health  ? ?Financial Resource Strain: Not on file  ?Food Insecurity: No Food Insecurity  ? Worried About Charity fundraiser in the Last Year: Never true  ? Ran Out of Food in the Last Year: Never true  ?Transportation Needs: No Transportation Needs  ? Lack of Transportation (Medical): No  ? Lack of Transportation (Non-Medical): No  ?Physical Activity: Not on file  ?Stress: Not on file  ?Social Connections: Not on file  ?Intimate Partner Violence: Not on file  ? ? ?Outpatient Medications Prior to Visit  ?Medication Sig Dispense Refill  ? albuterol (VENTOLIN HFA) 108 (90 Base) MCG/ACT inhaler Inhale 1-2 puffs into the lungs every 4 (four) hours as needed for wheezing or shortness of breath. 8 g 5  ? atorvastatin (LIPITOR) 40 MG tablet Take 1 tablet (40 mg total) by mouth daily. 30 tablet 5  ? loratadine (CLARITIN) 10 MG tablet Take 1 tablet (10 mg total) by mouth daily. 90 tablet 4  ? tobramycin-dexamethasone (TOBRADEX) ophthalmic solution Place 1 drop into both eyes in the morning and at bedtime. (Patient not taking: Reported on 05/18/2021) 5 mL 0  ? benzonatate (TESSALON) 200 MG capsule Take 1 capsule (200 mg total) by mouth 2 (two) times daily as needed for cough. (Patient not taking: Reported on 10/01/2021) 20 capsule 0  ? omeprazole (PRILOSEC) 40 MG capsule Take 1 capsule (40 mg total) by mouth at bedtime. (Patient not taking: Reported on 05/18/2021) 30 capsule 0  ? ?No facility-administered medications prior to visit.  ? ? ?Allergies  ?Allergen Reactions  ? Doxorubicin Hcl Rash  ?  Experience chest tightness, abdominal pain, and redness around lips during and shortly after doxorubicin bolus.   ? ? ?Review of Systems  ?Constitutional:  Positive for malaise/fatigue. Negative for  chills and fever.  ?HENT: Negative.    ?Eyes:  Positive for blurred vision. Negative for double vision, photophobia, pain, discharge and redness.  ?     See HPI  ?Respiratory:  Negative for cough and shortness of breath.   ?Cardiovascular:  Negative for chest pain, palpitations and leg swelling.  ?Gastrointestinal:  Negative for abdominal pain, blood in stool, constipation, diarrhea, nausea and vomiting.  ?Musculoskeletal:  Positive for back pain. Negative for falls, joint pain, myalgias and neck pain.  ?Skin: Negative.   ?Neurological: Negative.   ?Psychiatric/Behavioral:  Negative for depression. The patient is not nervous/anxious.   ?All other systems reviewed and are negative. ? ?   ?Objective:  ?  ?Physical Exam ?Vitals reviewed.  ?Constitutional:   ?   General: She is not in acute distress. ?   Appearance: Normal appearance. She is obese.  ?HENT:  ?   Head: Normocephalic.  ?  Eyes:  ?   General: No scleral icterus.    ?   Right eye: No discharge.     ?   Left eye: No discharge.  ?   Extraocular Movements: Extraocular movements intact.  ?   Conjunctiva/sclera: Conjunctivae normal.  ?   Pupils: Pupils are equal, round, and reactive to light.  ?   Comments: No obvious periorbital swelling noted  ?Neck:  ?   Vascular: No carotid bruit.  ?Cardiovascular:  ?   Rate and Rhythm: Normal rate and regular rhythm.  ?   Pulses: Normal pulses.  ?   Heart sounds: Normal heart sounds.  ?   Comments: No obvious peripheral edema ?Pulmonary:  ?   Effort: Pulmonary effort is normal.  ?   Breath sounds: Normal breath sounds.  ?Musculoskeletal:     ?   General: No swelling, tenderness, deformity or signs of injury. Normal range of motion.  ?   Cervical back: Normal range of motion and neck supple. No rigidity or tenderness.  ?   Right lower leg: No edema.  ?   Left lower leg: No edema.  ?   Comments: Moderate tenderness with palpation of the diffuse thoracic area   ?Lymphadenopathy:  ?   Cervical: No cervical adenopathy.  ?Skin: ?    General: Skin is warm and dry.  ?   Capillary Refill: Capillary refill takes less than 2 seconds.  ?Neurological:  ?   General: No focal deficit present.  ?   Mental Status: She is alert and oriented to person

## 2021-11-29 NOTE — Patient Instructions (Signed)
You were seen today in the [facility] for reevaluation of chronic symptoms. Labs were collected, results will be available via MyChart or, if abnormal, you will be contacted by clinic staff. You were prescribed medications, please take as directed. Please follow up in 6 mths for reevaluation.  ? ?Eye Doctors That Accept Medicaid and/or Medicare ? ?[facility] ?[address],  ?[place], [place] [address] ?[phone] ?[email or web address]  ? ?[facility] ?[place], [place] [address] ?[phone] ?[email or web address]  ? ?[facility] ?[place] [address] ?[place], [place] [address] ?Located next to [address] ?Phone: [phone] ?[place] ?[place] ?[place], [place] [address] ?Located next to [address] ?Phone: [phone] ?[email or web address]  ? ?[facility] ?[address] ?[place], [place] [address] ?[phone] ?[email or web address]  ? ?[facility] ?[address] ?[place], [place] [address] ?[phone] ?[email or web address]  ? ?[facility] ?[address]  ?[place],  [address] ?[phone] ?[email or web address]  ? ?

## 2021-11-30 LAB — VITAMIN B12: Vitamin B-12: 433 pg/mL (ref 232–1245)

## 2021-11-30 LAB — HEMOGLOBIN A1C
Est. average glucose Bld gHb Est-mCnc: 114 mg/dL
Hgb A1c MFr Bld: 5.6 % (ref 4.8–5.6)

## 2021-11-30 LAB — VITAMIN D 25 HYDROXY (VIT D DEFICIENCY, FRACTURES): Vit D, 25-Hydroxy: 20 ng/mL — ABNORMAL LOW (ref 30.0–100.0)

## 2021-11-30 MED ORDER — VITAMIN D (ERGOCALCIFEROL) 1.25 MG (50000 UNIT) PO CAPS
50000.0000 [IU] | ORAL_CAPSULE | ORAL | 0 refills | Status: AC
Start: 2021-11-30 — End: 2022-01-19

## 2021-11-30 NOTE — Telephone Encounter (Signed)
This will have to be sent in on April 1st with the next set of orange card referrals. ?

## 2021-12-06 ENCOUNTER — Encounter: Payer: Self-pay | Admitting: Physician Assistant

## 2021-12-06 ENCOUNTER — Ambulatory Visit (INDEPENDENT_AMBULATORY_CARE_PROVIDER_SITE_OTHER): Payer: Self-pay

## 2021-12-06 ENCOUNTER — Telehealth: Payer: Self-pay | Admitting: Physician Assistant

## 2021-12-06 ENCOUNTER — Ambulatory Visit (INDEPENDENT_AMBULATORY_CARE_PROVIDER_SITE_OTHER): Payer: Self-pay | Admitting: Physician Assistant

## 2021-12-06 DIAGNOSIS — Z789 Other specified health status: Secondary | ICD-10-CM

## 2021-12-06 DIAGNOSIS — M546 Pain in thoracic spine: Secondary | ICD-10-CM

## 2021-12-06 DIAGNOSIS — G8929 Other chronic pain: Secondary | ICD-10-CM

## 2021-12-06 DIAGNOSIS — M545 Low back pain, unspecified: Secondary | ICD-10-CM

## 2021-12-06 MED ORDER — METHYLPREDNISOLONE 4 MG PO TABS: ORAL_TABLET | ORAL | 0 refills | Status: DC

## 2021-12-06 NOTE — Telephone Encounter (Signed)
Kayla from [facility] called wondering if the script that was sent in today was suppose to be the dose pack or individual pills.  ? ?CB [phone]  ?
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
## 2021-12-06 NOTE — Telephone Encounter (Signed)
Pharmacy aware  dose pak ?

## 2021-12-07 ENCOUNTER — Encounter: Payer: Self-pay | Admitting: Oncology

## 2021-12-22 ENCOUNTER — Ambulatory Visit (INDEPENDENT_AMBULATORY_CARE_PROVIDER_SITE_OTHER): Payer: No Typology Code available for payment source | Admitting: Physical Therapy

## 2021-12-22 ENCOUNTER — Encounter: Payer: Self-pay | Admitting: Physical Therapy

## 2021-12-22 DIAGNOSIS — M6281 Muscle weakness (generalized): Secondary | ICD-10-CM

## 2021-12-22 DIAGNOSIS — M546 Pain in thoracic spine: Secondary | ICD-10-CM

## 2021-12-22 DIAGNOSIS — M5459 Other low back pain: Secondary | ICD-10-CM
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
## 2021-12-31 ENCOUNTER — Encounter: Payer: Self-pay | Admitting: Rehabilitative and Restorative Service Providers"

## 2021-12-31 ENCOUNTER — Other Ambulatory Visit: Payer: Self-pay

## 2021-12-31 ENCOUNTER — Ambulatory Visit (INDEPENDENT_AMBULATORY_CARE_PROVIDER_SITE_OTHER): Payer: Self-pay | Admitting: Rehabilitative and Restorative Service Providers"

## 2021-12-31 DIAGNOSIS — M5459 Other low back pain: Secondary | ICD-10-CM

## 2021-12-31 DIAGNOSIS — M6281 Muscle weakness (generalized): Secondary | ICD-10-CM

## 2021-12-31 DIAGNOSIS — M546 Pain in thoracic spine: Secondary | ICD-10-CM

## 2021-12-31 NOTE — Therapy (Signed)
?OUTPATIENT PHYSICAL THERAPY TREATMENT NOTE ? ? ?Patient Name: [name] ?MRN: [id number] ?DOB:05-11-1976, 46 y.o., female ?Today's Date: 12/31/2021 ? ?PCP: Teena Dunk, NP ?REFERRING PROVIDER: Pete Pelt, PA-C ? ?END OF SESSION:  ? PT End of Session - 12/31/21 1257   ? ? Visit Number 2   ? Number of Visits 12   ? Date for PT Re-Evaluation 03/16/22   ? Authorization Type CAFA expires 7/28   ? PT Start Time 1258   ? PT Stop Time 1335   ? PT Time Calculation (min) 37 min   ? Activity Tolerance Patient limited by pain   ? Behavior During Therapy [facility] for tasks assessed/performed   ? ?  ?  ? ?  ? ? ?Past Medical History:  ?Diagnosis Date  ? Allergy   ? Asthma   ? Breast cancer in female [facility])   ? Left  ? Breast disorder   ? breast cancer May 2021  ? Chronic back pain   ? History of breast cancer   ? History of COVID-19 04/20/2021  ? Hx of migraines   ? Morbid obesity ([place])   ? ?Past Surgical History:  ?Procedure Laterality Date  ? BREAST LUMPECTOMY WITH RADIOACTIVE SEED AND SENTINEL LYMPH NODE BIOPSY Left 07/15/2020  ? Procedure: LEFT BREAST LUMPECTOMY WITH RADIOACTIVE SEED AND LEFT AXILLARY SENTINEL LYMPH NODE BIOPSY, LEFT AXILLARY NODE RADIOACTIVE SEED GUIDED EXCISION, LEFT BREAST RADIOACTIVE SEED GUIDED EXCISION IM NODE;  Surgeon: Rolm Bookbinder, MD;  Location: [place];  Service: General;  Laterality: Left;  PEC BLOCK  ? CESAREAN SECTION WITH BILATERAL TUBAL LIGATION Bilateral 07/23/2015  ? Procedure: CESAREAN SECTION WITH BILATERAL TUBAL LIGATION;  Surgeon: Donnamae Jude, MD;  Location: [facility];  Service: Obstetrics;  Laterality: Bilateral;  ? PORT-A-CATH REMOVAL N/A 04/30/2021  ? Procedure: REMOVAL PORT-A-CATH;  Surgeon: Rolm Bookbinder, MD;  Location: [place];  Service: General;  Laterality: N/A;  ? PORTACATH PLACEMENT Right 02/11/2020  ? Procedure: INSERTION PORT-A-CATH WITH ULTRASOUND GUIDANCE;  Surgeon: Rolm Bookbinder, MD;  Location: [place];   Service: General;  Laterality: Right;  ? TUBAL LIGATION    ? ?Patient Active Problem List  ? Diagnosis Date Noted  ? Language barrier 12/11/2020  ? Morbid obesity ([place])   ? History of breast cancer   ? Port-A-Cath in place 03/25/2020  ? Genetic testing 02/13/2020  ? Malignant neoplasm of upper-outer quadrant of left breast in female, estrogen receptor positive ([place]) 01/27/2020  ? Impaired ability to use community resources due to language barrier 07/13/2013  ? ? ?REFERRING PROVIDER: Pete Pelt, PA-C ?  ?REFERRING DIAG: M54.50,G89.29 (ICD-10-CM) - Chronic low back pain, unspecified back pain laterality, unspecified whether sciatica present ?M54.6 (ICD-10-CM) - Thoracic back pain, unspecified back pain laterality, unspecified chronicity ? ?ONSET DATE: 2 year onset of pain ? ?THERAPY DIAG:  ?Pain in thoracic spine ? ?Other low back pain ? ?Muscle weakness (generalized) ? ?PERTINENT HISTORY: JQG:BEEFEO Ca in remission,chronic back pain,obesity ? ?PRECAUTIONS: None ? ?SUBJECTIVE:  Pt indicated no real change since last viist.  Pt indicated feeling constant pain in back, sometimes worse.  Pt indicated she did try to do the exercises at home, with complaints of pain c leaning backward and forward.  ? ?Communication with interpreter service.  ? ?PAIN:  ?Are you having pain? Yes:  ?NPRS scale: 8/10 ?Pain location: L4-T6  superior area more intense  ?Pain description: tense and  hard to sleep ?Aggravating factors: constant ?Relieving  factors: nothing ? ? ?OBJECTIVE:  ?  ?DIAGNOSTIC FINDINGS:  ?12/22/2021 XR in chart, thoracic shows mild degenerative changes no acute, lumbar XR negative ?  ?PATIENT SURVEYS:  ?12/22/2021 FOTO 45% functional intake ?  ?SCREENING FOR RED FLAGS: ?Bowel or bladder incontinence: No ?Cauda equina syndrome: No ?Compression fracture: No ?  ?  ?COGNITION: ?          12/22/2021 Overall cognitive status: Within functional limits for tasks assessed               ?           ?SENSATION: ?12/22/2021 WFL ?   ?POSTURE:  ?12/22/2021 Slumped posture, thoracic kyphosis ?  ?PALPATION: ?12/22/2021 TTP widespead in T-L spine but most severe around T6-7 ?  ?LUMBAR ROM:  ?  ?Active  A/PROM  ?12/22/2021 AROM ?12/31/2021  ?Flexion 75% pain Movement to toes c increased pressure   ?Extension 25% pain 50% ERP lumbar  ?Right lateral flexion 75% pain   ?Left lateral flexion 75% pain   ?Right rotation 75% pain   ?Left rotation 75% pain   ? (Blank rows = not tested) ?  ?LE ROM:  ?12/22/2021 WFL  ?  ? ?LE MMT:  ?12/22/2021: grossly 4+, UE MMT:  ?  ? (Blank rows = not tested) ?  ?LUMBAR SPECIAL TESTS:  ? 12/22/2021 Slump test negative bilat, SLR test negative on Rt and + on left ?  ?FUNCTIONAL TESTS:  ?   ?GAIT: ?12/22/2021: Comments: WFL ?  ?  ?  ?TODAY'S TREATMENT  ?12/31/2021: ? Therex: ?  Supine LTR stretch 15 sec x 3 bilateral ?  Supine bridge 2-3 sec hold x 15 ?  Supine green band clam shells x 20 bilateral  ?  Supine marching alternating 2 x 10 c transverse ab contraction hold ?  Seated pball press down for ab activation 5 sec hold x 10 ?  Seated pball hip adductor squeeze 5 sec hold x 10 ? ? Estim:  IFC lower thoracic/upper lumbar region to tolerance 10 mins c moist heat to area in sitting.  ? ? ?12/22/21 ?HEP review ?TENS and moist heat to thoracic paraspinals X 10 min ?  ?  ?PATIENT EDUCATION:  ?12/31/2021 ?Education details: HEP progression  ?Person educated: Patient ?Education method: Explanation, Demonstration, Verbal cues, and Handouts ?Education comprehension: verbalized understanding ?  ?  ?HOME EXERCISE PROGRAM: ?12/31/2021: ?Access Code: [email or web address] ?URL: [email or web address] ?Date: 12/31/2021 ?Prepared by: Scot Jun ? ?Exercises ?- Seated Upper Thoracic Stretch  - 2 x daily - 6 x weekly - 1 sets - 10 reps - 5 hold ?- Standing Thoracic Spine Stretch  - 2 x daily - 6 x weekly - 1 sets - 10 reps - 5 hold ?- Sidelying Thoracic Rotation with Open Book  - 2 x daily - 6 x weekly - 1 sets - 10 reps - 5 sec hold ?- Standing  Scapular Retraction with External Rotation  - 2 x daily - 6 x weekly - 3 sets - 10 reps ?- Supine March with Posterior Pelvic Tilt  - 2 x daily - 7 x weekly - 3 sets - 10 reps ?- Supine Lower Trunk Rotation  - 2-3 x daily - 7 x weekly - 1 sets - 3-5 reps - 15 hold ?  ?ASSESSMENT: ?  ?CLINICAL IMPRESSION:  ?Continued increased complaints in lower thoracic and lumbar region c mobility (restrictions noted) and constant symptoms reported overall to this point. Activity tolerance within clinic today fair overall but was  able to perform interventions without noted pain increase above resting complaints.   Continued skilled PT services indicated.  ?  ?OBJECTIVE IMPAIRMENTS: decreased activity tolerance, difficulty walking, decreased balance, decreased endurance, decreased mobility, decreased ROM, decreased strength, impaired flexibility, impaired UE/LE use, postural dysfunction, and pain. ?  ?ACTIVITY LIMITATIONS: bending, lifting, carry, locomotion, cleaning, community activity, driving, and or occupation ?  ?PERSONAL FACTORS :breast Ca in remision,chronic back pain,obesity, are also affecting patient's functional outcome. ?  ?REHAB POTENTIAL: Fair   ?  ?CLINICAL DECISION MAKING: Evolving/moderate complexity ?  ?EVALUATION COMPLEXITY: Moderate ?  ?  ?  ?GOALS: ?Short term PT Goals Target date: 01/19/2022 ?Pt will be I and compliant with HEP. ?Baseline:  ?Goal status: on going - assessed 12/31/2021 ?Pt will decrease pain by 25% overall ?Baseline: ?Goal status: on going - assessed 12/31/2021 ?  ?Long term PT goals Target date: 03/16/2022 ?Pt will improve ROM to [facility] to improve functional mobility ?Baseline: ?Goal status: New ?Pt will improve UE strength 4+ grossly MMT to improve functional strength ?Baseline: ?Goal status: New ?Pt will improve FOTO to at least 56% functional to show improved function ?Baseline: ?Goal status: New ?Pt will reduce pain by overall 50% overall with usual activity, ADL's ?Baseline: ?Goal status: New ?   ?PLAN: ?PT FREQUENCY: 1-2 times per week  ?  ?PT DURATION: 6-8 weeks ?  ?PLANNED INTERVENTIONS (unless contraindicated): aquatic PT, Canalith repositioning, cryotherapy, Electrical stimulation, Iontophore

## 2022-01-03 ENCOUNTER — Telehealth: Payer: Self-pay

## 2022-01-03 ENCOUNTER — Encounter: Payer: Self-pay | Admitting: Physical Therapy

## 2022-01-03 ENCOUNTER — Encounter: Payer: Self-pay | Admitting: Adult Health

## 2022-01-03 ENCOUNTER — Ambulatory Visit: Payer: Self-pay | Attending: General Surgery

## 2022-01-03 ENCOUNTER — Inpatient Hospital Stay: Payer: Self-pay | Attending: Adult Health | Admitting: Adult Health

## 2022-01-03 ENCOUNTER — Ambulatory Visit (INDEPENDENT_AMBULATORY_CARE_PROVIDER_SITE_OTHER): Payer: Self-pay | Admitting: Physical Therapy

## 2022-01-03 ENCOUNTER — Other Ambulatory Visit: Payer: Self-pay

## 2022-01-03 VITALS — BP 118/79 | HR 83 | Temp 97.7°F | Resp 18 | Ht 60.0 in | Wt 191.3 lb

## 2022-01-03 VITALS — Wt 189.2 lb

## 2022-01-03 DIAGNOSIS — Z170 Estrogen receptor positive status [ER+]: Secondary | ICD-10-CM

## 2022-01-03 DIAGNOSIS — M546 Pain in thoracic spine: Secondary | ICD-10-CM

## 2022-01-03 DIAGNOSIS — C50412 Malignant neoplasm of upper-outer quadrant of left female breast: Secondary | ICD-10-CM | POA: Insufficient documentation

## 2022-01-03 DIAGNOSIS — M5459 Other low back pain: Secondary | ICD-10-CM

## 2022-01-03 DIAGNOSIS — R21 Rash and other nonspecific skin eruption: Secondary | ICD-10-CM | POA: Insufficient documentation

## 2022-01-03 DIAGNOSIS — M6281 Muscle weakness (generalized): Secondary | ICD-10-CM

## 2022-01-03 DIAGNOSIS — Z171 Estrogen receptor negative status [ER-]: Secondary | ICD-10-CM | POA: Insufficient documentation

## 2022-01-03 DIAGNOSIS — Z483 Aftercare following surgery for neoplasm: Secondary | ICD-10-CM | POA: Insufficient documentation

## 2022-01-03 DIAGNOSIS — Z923 Personal history of irradiation: Secondary | ICD-10-CM | POA: Insufficient documentation

## 2022-01-03 DIAGNOSIS — Z8616 Personal history of COVID-19: Secondary | ICD-10-CM | POA: Insufficient documentation

## 2022-01-03 DIAGNOSIS — Z9221 Personal history of antineoplastic chemotherapy: Secondary | ICD-10-CM | POA: Insufficient documentation

## 2022-01-03 DIAGNOSIS — M549 Dorsalgia, unspecified: Secondary | ICD-10-CM | POA: Insufficient documentation

## 2022-01-03 MED ORDER — PREDNISONE 10 MG PO TABS: 10.0000 mg | ORAL_TABLET | Freq: Every day | ORAL | 0 refills | Status: DC

## 2022-01-03 MED ORDER — AMOXICILLIN-POT CLAVULANATE 875-125 MG PO TABS: 1.0000 | ORAL_TABLET | Freq: Two times a day (BID) | ORAL | 0 refills | Status: DC

## 2022-01-03 NOTE — Assessment & Plan Note (Signed)
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  She is currently on observation. ? ?She is here today for breast redness and skin rash and back pain.  I have ordered Augmentin, prednisone, and an MRI of the spine to rule out recurrence.  I will see her back in 1 week to evaluate her further.  If the erythema remains on her breast I will send her to surgery for consideration of skin punch biopsy. ? ?She wanted a scan of her lungs, if after the prednisone taper she is still having issues with breathing I will order chest x-ray. ?

## 2022-01-03 NOTE — Progress Notes (Signed)
[facility] Follow up: ?  ? ?[name] I, NP ?[address] ?[place] [address] ? ? ?DIAGNOSIS:  Cancer Staging  ?Malignant neoplasm of upper-outer quadrant of left breast in female, estrogen receptor positive ([place]) ?Staging form: Breast, AJCC 8th Edition ?- Clinical stage from 01/29/2020: Stage IIIA (cT2, cN1, cM0, G3, ER+, PR-, HER2-) - Unsigned ?Stage prefix: Initial diagnosis ?Histologic grading system: 3 grade system ? ? 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 ?            (a) chest CT scan and bone scan 02/07/2020 showed no evidence of metastatic disease ?  ?(1) neoadjuvant chemotherapy consisting of doxorubicin and cyclophosphamide in dose dense fashion x4 started 02/12/2020, completed 03/25/2020, followed by paclitaxel and carboplatin weekly x12 started 04/08/2020 and completed on 05/28/2020 ?            (a) echo 02/07/2020 shows an ejection fraction in the 55-60% range ?            (b) Epirubicin substituted for Doxorubicin starting with cycle 2 of AC secondary to allergic rash from Doxorubicin ?  ?(2) status post left lumpectomy and sentinel lymph node sampling 07/15/2020 for a ypT1b ypN0 residual invasive ductal carcinoma, grade 3, with negative margins. ?            (a) a total of 4 left axillary lymph nodes were removed ?            (b) repeat prognostic panel on the final pathology again triple negative, with an MIB-1 of 50%. ?  ?(3) adjuvant radiation completed 10/09/2020 ?            (a) sensitizing capecitabine attempted but not tolerated by the patient ?  ?(4) genetics testing 02/07/2020 through the [facility] Common Hereditary Cancers Panel found no deleterious mutations in  APC, ATM, AXIN2, BARD1, BMPR1A, BRCA1, BRCA2, BRIP1, CDH1, CDKN2A (p14ARF), CDKN2A (p16INK4a), CKD4,  CHEK2, CTNNA1, DICER1, EPCAM (Deletion/duplication testing only), GREM1 (promoter region deletion/duplication testing only), KIT, MEN1, MLH1, MSH2, MSH3, MSH6, MUTYH, NBN, NF1, NHTL1, PALB2, PDGFRA, PMS2, POLD1, POLE, PTEN, RAD50, RAD51C, RAD51D, RNF43, SDHB, SDHC, SDHD, SMAD4, SMARCA4. STK11, TP53, TSC1, TSC2, and VHL.  The following genes were evaluated for sequence changes only: SDHA and HOXB13 c.251G>A variant only. ?            (a) VUS in MSH6 called c.2744C>G identified.  ?  ?(5)  adjuvant pembrolizumab started 11/23/2020, to continue for 1 year ?            (a) discontinued after 01/04/2021 dose with multiple side effects requiring steroids for resolution ? ?CURRENT THERAPY: Observation ? ?INTERVAL HISTORY: ?[name] 46 y.o. female returns for urgent follow-up of skin rash and back pain.  She is accompanied by spanish interpreter Lavon Paganini. She has had back pain for a couple of months.  She notes that it is 7 out of 10 she has seen Ortho for this and was given exercises which did not help.  The pain has persisted.  An x-ray was completed that was inconclusive and she was given a Medrol Dosepak.  She is still having some issues. ? ?She also has this redness on her skin that has been present for 5 months.  It is starting to worsen.  There is now  redness in her breast.  She denies any fevers or chills. ? ? ?Patient Active Problem List  ? Diagnosis Date Noted  ? Language barrier 12/11/2020  ? Morbid obesity ([place])   ? History of breast cancer   ? Genetic testing 02/13/2020  ? Malignant neoplasm of upper-outer quadrant of left breast in female, estrogen receptor positive ([place]) 01/27/2020  ? Impaired ability to use community resources due to language barrier 07/13/2013  ? ? ?is allergic to doxorubicin hcl. ? ?MEDICAL HISTORY: ?Past Medical History:  ?Diagnosis Date  ? Allergy   ? Asthma   ? Breast cancer in female [facility])   ? Left  ? Breast disorder   ? breast cancer May 2021  ? Chronic back pain   ?  History of breast cancer   ? History of COVID-19 04/20/2021  ? Hx of migraines   ? Morbid obesity ([place])   ? ? ?SURGICAL HISTORY: ?Past Surgical History:  ?Procedure Laterality Date  ? BREAST LUMPECTOMY WITH RADIOACTIVE SEED AND SENTINEL LYMPH NODE BIOPSY Left 07/15/2020  ? Procedure: LEFT BREAST LUMPECTOMY WITH RADIOACTIVE SEED AND LEFT AXILLARY SENTINEL LYMPH NODE BIOPSY, LEFT AXILLARY NODE RADIOACTIVE SEED GUIDED EXCISION, LEFT BREAST RADIOACTIVE SEED GUIDED EXCISION IM NODE;  Surgeon: Rolm Bookbinder, MD;  Location: [facility];  Service: General;  Laterality: Left;  PEC BLOCK  ? CESAREAN SECTION WITH BILATERAL TUBAL LIGATION Bilateral 07/23/2015  ? Procedure: CESAREAN SECTION WITH BILATERAL TUBAL LIGATION;  Surgeon: Donnamae Jude, MD;  Location: [facility];  Service: Obstetrics;  Laterality: Bilateral;  ? PORT-A-CATH REMOVAL N/A 04/30/2021  ? Procedure: REMOVAL PORT-A-CATH;  Surgeon: Rolm Bookbinder, MD;  Location: [facility] [place];  Service: General;  Laterality: N/A;  ? PORTACATH PLACEMENT Right 02/11/2020  ? Procedure: INSERTION PORT-A-CATH WITH ULTRASOUND GUIDANCE;  Surgeon: Rolm Bookbinder, MD;  Location: [facility];  Service: General;  Laterality: Right;  ? TUBAL LIGATION    ? ? ?SOCIAL HISTORY: ?Social History  ? ?Socioeconomic History  ? Marital status: Legally Separated  ?  Spouse name: Not on file  ? Number of children: 3  ? Years of education: Not on file  ? Highest education level: 9th grade  ?Occupational History  ? Occupation: Architect  ?Tobacco Use  ? Smoking status: Never  ? Smokeless tobacco: Never  ?Vaping Use  ? Vaping Use: Never used  ?Substance and Sexual Activity  ? Alcohol use: No  ? Drug use: Never  ? Sexual activity: Yes  ?  Birth control/protection: Surgical  ?Other Topics Concern  ? Not on file  ?Social History Narrative  ? Not on file  ? ?Social Determinants of Health  ? ?Financial Resource Strain: Not on file  ?Food Insecurity: No Food Insecurity  ? Worried  About Charity fundraiser in the Last Year: Never true  ? Ran Out of Food in the Last Year: Never true  ?Transportation Needs: No Transportation Needs  ? Lack of Transportation (Medical): No  ? Lack of Transportation (Non-Medical): No  ?Physical Activity: Not on file  ?Stress: Not on file  ?Social Connections: Not on file  ?Intimate Partner Violence: Not on file  ? ? ?FAMILY HISTORY: ?Family History  ?Problem Relation Age of Onset  ? Heart disease Maternal Grandmother   ? ? ?Review of Systems  ?Constitutional:  Negative for appetite change, chills, fatigue, fever and unexpected weight change.  ?HENT:   Negative for hearing loss, lump/mass and trouble swallowing.   ?Eyes:  Negative for eye problems and  icterus.  ?Respiratory:  Negative for chest tightness, cough and shortness of breath.   ?Cardiovascular:  Negative for chest pain, leg swelling and palpitations.  ?Gastrointestinal:  Negative for abdominal distention, abdominal pain, constipation, diarrhea, nausea and vomiting.  ?Endocrine: Negative for hot flashes.  ?Genitourinary:  Negative for difficulty urinating.   ?Musculoskeletal:  Positive for back pain. Negative for arthralgias and gait problem.  ?Skin:  Negative for itching and rash.  ?Neurological:  Negative for dizziness, extremity weakness, gait problem, headaches, light-headedness, numbness, seizures and speech difficulty.  ?Hematological:  Negative for adenopathy. Does not bruise/bleed easily.  ?Psychiatric/Behavioral:  Negative for depression. The patient is not nervous/anxious.    ? ? ?PHYSICAL EXAMINATION ? ?ECOG PERFORMANCE STATUS: 1 - Symptomatic but completely ambulatory ? ?Vitals:  ? 01/03/22 1509  ?BP: 118/79  ?Pulse: 83  ?Resp: 18  ?Temp: 97.7 ?F (36.5 ?C)  ?SpO2: 98%  ? ? ?Physical Exam ?Constitutional:   ?   General: She is not in acute distress. ?   Appearance: Normal appearance. She is not toxic-appearing.  ?HENT:  ?   Head: Normocephalic and atraumatic.  ?Eyes:  ?   General: No scleral  icterus. ?Cardiovascular:  ?   Rate and Rhythm: Normal rate and regular rhythm.  ?   Pulses: Normal pulses.  ?   Heart sounds: Normal heart sounds.  ?Pulmonary:  ?   Effort: Pulmonary effort is normal.  ?   Breath

## 2022-01-03 NOTE — Therapy (Signed)
?OUTPATIENT PHYSICAL THERAPY SOZO SCREENING NOTE ? ? ?Patient Name: [name] ?MRN: [id number] ?DOB:1976-09-10, 46 y.o., female ?Today's Date: 01/03/2022 ? ?PCP: Teena Dunk, NP ?REFERRING PROVIDER: Rolm Bookbinder, MD ? ? PT End of Session - 01/03/22 1021   ? ? Visit Number 3   # unchanged due to screen only  ? PT Start Time 1018   ? PT Stop Time 1029   ? PT Time Calculation (min) 11 min   ? Activity Tolerance Patient tolerated treatment well   ? Behavior During Therapy [facility] for tasks assessed/performed   ? ?  ?  ? ?  ? ? ?Past Medical History:  ?Diagnosis Date  ? Allergy   ? Asthma   ? Breast cancer in female [facility])   ? Left  ? Breast disorder   ? breast cancer May 2021  ? Chronic back pain   ? History of breast cancer   ? History of COVID-19 04/20/2021  ? Hx of migraines   ? Morbid obesity ([place])   ? ?Past Surgical History:  ?Procedure Laterality Date  ? BREAST LUMPECTOMY WITH RADIOACTIVE SEED AND SENTINEL LYMPH NODE BIOPSY Left 07/15/2020  ? Procedure: LEFT BREAST LUMPECTOMY WITH RADIOACTIVE SEED AND LEFT AXILLARY SENTINEL LYMPH NODE BIOPSY, LEFT AXILLARY NODE RADIOACTIVE SEED GUIDED EXCISION, LEFT BREAST RADIOACTIVE SEED GUIDED EXCISION IM NODE;  Surgeon: Rolm Bookbinder, MD;  Location: [facility];  Service: General;  Laterality: Left;  PEC BLOCK  ? CESAREAN SECTION WITH BILATERAL TUBAL LIGATION Bilateral 07/23/2015  ? Procedure: CESAREAN SECTION WITH BILATERAL TUBAL LIGATION;  Surgeon: Donnamae Jude, MD;  Location: [facility];  Service: Obstetrics;  Laterality: Bilateral;  ? PORT-A-CATH REMOVAL N/A 04/30/2021  ? Procedure: REMOVAL PORT-A-CATH;  Surgeon: Rolm Bookbinder, MD;  Location: [place];  Service: General;  Laterality: N/A;  ? PORTACATH PLACEMENT Right 02/11/2020  ? Procedure: INSERTION PORT-A-CATH WITH ULTRASOUND GUIDANCE;  Surgeon: Rolm Bookbinder, MD;  Location: [facility] [place];  Service: General;  Laterality: Right;  ? TUBAL LIGATION    ? ?Patient Active  Problem List  ? Diagnosis Date Noted  ? Language barrier 12/11/2020  ? Morbid obesity ([place])   ? History of breast cancer   ? Port-A-Cath in place 03/25/2020  ? Genetic testing 02/13/2020  ? Malignant neoplasm of upper-outer quadrant of left breast in female, estrogen receptor positive ([place]) 01/27/2020  ? Impaired ability to use community resources due to language barrier 07/13/2013  ? ? ?REFERRING DIAG: left breast cancer at risk for lymphedema ? ?THERAPY DIAG:  ?Aftercare following surgery for neoplasm ? ?PERTINENT HISTORY: Patient was diagnosed on 01/23/2020 with left grade III invasive ductal carcinoma breast cancer. It is ER positive, PR negative, and HER2 negative with a Ki67 of 40%. She has 2 abnormal appearing lymph nodes and 1 was biopsied and found to be positive at time of diagnosis. Patient with interpreter present. She underwent neoadjuvant chemotherapy from 02/12/2020 - 05/28/2020. 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 She underwent a left lumpectomy and sentinel node biopsy ( 4 negative nodes) on 07/15/2020. She will undergo radiation and anti-estrogen therapy. She reports her neuropathy has resolved and has some limitation in shoulder ROM.  ? ?PRECAUTIONS: left UE Lymphedema risk, None ? ?SUBJECTIVE: Pt returns for her 3 month L-Dex screen.  ? ?PAIN:  ?Are you having pain? No, not currently ? ?SOZO SCREENING: ?Patient was assessed today using the SOZO machine to determine the lymphedema index score. This was compared  to her baseline score. It was determined that she is within the recommended range when compared to her baseline and no further action is needed at this time. She will continue SOZO screenings. These are done every 3 months for 2 years post operatively followed by every 6 months for 2 years, and then annually. ? ? ? ?Otelia Limes, PTA ?01/03/2022, 10:30 AM ? ?  ? ?

## 2022-01-03 NOTE — Telephone Encounter (Signed)
[name], Interpreter received call from pt today stating since December she has had some left flank redness, but now the redness has travelled to her back. Pt was offered appt for 1515 today with Wilber Bihari, NP and she accepted. Knows to arrive at 1500 for check in.  ?

## 2022-01-03 NOTE — Therapy (Signed)
?OUTPATIENT PHYSICAL THERAPY TREATMENT NOTE ? ? ?Patient Name: [name] ?MRN: [id number] ?DOB:01/15/1976, 46 y.o., female ?Today's Date: 01/03/2022 ? ?PCP: Teena Dunk, NP ?REFERRING PROVIDER: Pete Pelt, PA-C ? ?END OF SESSION:  ? PT End of Session - 01/03/22 3267   ? ? Visit Number 3   ? Number of Visits 12   ? Date for PT Re-Evaluation 03/16/22   ? Authorization Type CAFA expires 7/28   ? PT Start Time 0845   ? PT Stop Time [phone]   ? PT Time Calculation (min) 38 min   ? Activity Tolerance Patient limited by pain   ? Behavior During Therapy [facility] for tasks assessed/performed   ? ?  ?  ? ?  ? ? ?Past Medical History:  ?Diagnosis Date  ? Allergy   ? Asthma   ? Breast cancer in female [facility])   ? Left  ? Breast disorder   ? breast cancer May 2021  ? Chronic back pain   ? History of breast cancer   ? History of COVID-19 04/20/2021  ? Hx of migraines   ? Morbid obesity ([place])   ? ?Past Surgical History:  ?Procedure Laterality Date  ? BREAST LUMPECTOMY WITH RADIOACTIVE SEED AND SENTINEL LYMPH NODE BIOPSY Left 07/15/2020  ? Procedure: LEFT BREAST LUMPECTOMY WITH RADIOACTIVE SEED AND LEFT AXILLARY SENTINEL LYMPH NODE BIOPSY, LEFT AXILLARY NODE RADIOACTIVE SEED GUIDED EXCISION, LEFT BREAST RADIOACTIVE SEED GUIDED EXCISION IM NODE;  Surgeon: Rolm Bookbinder, MD;  Location: [facility];  Service: General;  Laterality: Left;  PEC BLOCK  ? CESAREAN SECTION WITH BILATERAL TUBAL LIGATION Bilateral 07/23/2015  ? Procedure: CESAREAN SECTION WITH BILATERAL TUBAL LIGATION;  Surgeon: Donnamae Jude, MD;  Location: [facility];  Service: Obstetrics;  Laterality: Bilateral;  ? PORT-A-CATH REMOVAL N/A 04/30/2021  ? Procedure: REMOVAL PORT-A-CATH;  Surgeon: Rolm Bookbinder, MD;  Location: [facility];  Service: General;  Laterality: N/A;  ? PORTACATH PLACEMENT Right 02/11/2020  ? Procedure: INSERTION PORT-A-CATH WITH ULTRASOUND GUIDANCE;  Surgeon: Rolm Bookbinder, MD;  Location: [place] [facility];   Service: General;  Laterality: Right;  ? TUBAL LIGATION    ? ?Patient Active Problem List  ? Diagnosis Date Noted  ? Language barrier 12/11/2020  ? Morbid obesity ([place])   ? History of breast cancer   ? Port-A-Cath in place 03/25/2020  ? Genetic testing 02/13/2020  ? Malignant neoplasm of upper-outer quadrant of left breast in female, estrogen receptor positive ([place]) 01/27/2020  ? Impaired ability to use community resources due to language barrier 07/13/2013  ? ? ?REFERRING PROVIDER: Pete Pelt, PA-C ?  ?REFERRING DIAG: M54.50,G89.29 (ICD-10-CM) - Chronic low back pain, unspecified back pain laterality, unspecified whether sciatica present ?M54.6 (ICD-10-CM) - Thoracic back pain, unspecified back pain laterality, unspecified chronicity ? ?ONSET DATE: 2 year onset of pain ? ?THERAPY DIAG:  ?Pain in thoracic spine ? ?Other low back pain ? ?Muscle weakness (generalized) ? ?PERTINENT HISTORY: YKD:[id number] Ca in remission,chronic back pain,obesity ? ?PRECAUTIONS: None ? ?SUBJECTIVE:  Pt reports pain is about a 6 today in her mid back ? ?Communication with interpreter service.  ? ?PAIN:  ?Are you having pain? Yes:  ?NPRS scale: 6/10 ?Pain location: L4-T6  superior area more intense  ?Pain description: tense and  hard to sleep ?Aggravating factors: constant ?Relieving factors: nothing ? ? ?OBJECTIVE:  ?  ?DIAGNOSTIC FINDINGS:  ?12/22/2021 XR in chart, thoracic shows mild degenerative changes no acute, lumbar XR negative ?  ?PATIENT  SURVEYS:  ?12/22/2021 FOTO 45% functional intake ?  ?SCREENING FOR RED FLAGS: ?Bowel or bladder incontinence: No ?Cauda equina syndrome: No ?Compression fracture: No ?  ?  ?COGNITION: ?          12/22/2021 Overall cognitive status: Within functional limits for tasks assessed               ?           ?SENSATION: ?12/22/2021 WFL ?  ?POSTURE:  ?12/22/2021 Slumped posture, thoracic kyphosis ?  ?PALPATION: ?12/22/2021 TTP widespead in T-L spine but most severe around T6-7 ?  ?LUMBAR ROM:  ?  ?Active   A/PROM  ?12/22/2021 AROM ?12/31/2021  ?Flexion 75% pain Movement to toes c increased pressure   ?Extension 25% pain 50% ERP lumbar  ?Right lateral flexion 75% pain   ?Left lateral flexion 75% pain   ?Right rotation 75% pain   ?Left rotation 75% pain   ? (Blank rows = not tested) ?  ?LE ROM:  ?12/22/2021 WFL  ?  ? ?LE MMT:  ?12/22/2021: grossly 4+, UE MMT:  ?  ? (Blank rows = not tested) ?  ?LUMBAR SPECIAL TESTS:  ? 12/22/2021 Slump test negative bilat, SLR test negative on Rt and + on left ?  ?FUNCTIONAL TESTS:  ?   ?GAIT: ?12/22/2021: Comments: WFL ?  ?  ?  ?TODAY'S TREATMENT  ?01/03/2022: ? Therex: ?  UBE L5 UE/LE X 5 min ?  Standing rows and extensions green 2X10 each bilat ?  Standing bilat ER red 2X10 ?  Standing H abd with red 2X10 ?  Supine LTR stretch 10 sec x 5 bilateral ?  Standing "L" thoracic stretch 10 sec  X10 ?  Standing red ball rolling up wall into max shoulder flexion 5 sec X 10 ?  Supine bridge 5 sec hold 2X10 ?  Supine marching alternating x 10 using ball for isometric hold 3 sec ?  Sidelying Thoracic rotations 5 sec hold X 10 bilat ? Manual therapy: Percussive device to thoracolumbar paraspinals in prone  ? ?12/31/2021: ? Therex: ?  Supine LTR stretch 15 sec x 3 bilateral ?  Supine bridge 2-3 sec hold x 15 ?  Supine green band clam shells x 20 bilateral  ?  Supine marching alternating 2 x 10 c transverse ab contraction hold ?  Seated pball press down for ab activation 5 sec hold x 10 ?  Seated pball hip adductor squeeze 5 sec hold x 10 ? ? Estim:  IFC lower thoracic/upper lumbar region to tolerance 10 mins c moist heat to area in sitting.  ? ? ?12/22/21 ?HEP review ?TENS and moist heat to thoracic paraspinals X 10 min ?  ?  ?PATIENT EDUCATION:  ?12/31/2021 ?Education details: HEP progression  ?Person educated: Patient ?Education method: Explanation, Demonstration, Verbal cues, and Handouts ?Education comprehension: verbalized understanding ?  ?  ?HOME EXERCISE PROGRAM: ?12/31/2021: ?Access Code:  [email or web address] ?URL: [email or web address] ?Date: 12/31/2021 ?Prepared by: Scot Jun ? ?Exercises ?- Seated Upper Thoracic Stretch  - 2 x daily - 6 x weekly - 1 sets - 10 reps - 5 hold ?- Standing Thoracic Spine Stretch  - 2 x daily - 6 x weekly - 1 sets - 10 reps - 5 hold ?- Sidelying Thoracic Rotation with Open Book  - 2 x daily - 6 x weekly - 1 sets - 10 reps - 5 sec hold ?- Standing Scapular Retraction with External Rotation  - 2 x daily - 6 x weekly -  3 sets - 10 reps ?- Supine March with Posterior Pelvic Tilt  - 2 x daily - 7 x weekly - 3 sets - 10 reps ?- Supine Lower Trunk Rotation  - 2-3 x daily - 7 x weekly - 1 sets - 3-5 reps - 15 hold ?  ?ASSESSMENT: ?  ?CLINICAL IMPRESSION:  ?She appeared to have less pain complants today and improved activity tolerance but did have a cramp in her low back during one of the exercises. I trialed percussive device today in efforts to reduce her muscle tension and pain in her thoracolumbar paraspinals.  ?  ?OBJECTIVE IMPAIRMENTS: decreased activity tolerance, difficulty walking, decreased balance, decreased endurance, decreased mobility, decreased ROM, decreased strength, impaired flexibility, impaired UE/LE use, postural dysfunction, and pain. ?  ?ACTIVITY LIMITATIONS: bending, lifting, carry, locomotion, cleaning, community activity, driving, and or occupation ?  ?PERSONAL FACTORS :breast Ca in remision,chronic back pain,obesity, are also affecting patient's functional outcome. ?  ?REHAB POTENTIAL: Fair   ?  ?CLINICAL DECISION MAKING: Evolving/moderate complexity ?  ?EVALUATION COMPLEXITY: Moderate ?  ?  ?  ?GOALS: ?Short term PT Goals Target date: 01/19/2022 ?Pt will be I and compliant with HEP. ?Baseline:  ?Goal status: on going - assessed 12/31/2021 ?Pt will decrease pain by 25% overall ?Baseline: ?Goal status: on going - assessed 12/31/2021 ?  ?Long term PT goals Target date: 03/16/2022 ?Pt will improve ROM to [facility] to improve functional  mobility ?Baseline: ?Goal status: New ?Pt will improve UE strength 4+ grossly MMT to improve functional strength ?Baseline: ?Goal status: New ?Pt will improve FOTO to at least 56% functional to show improved function ?Baseline: ?Goal status

## 2022-01-04 ENCOUNTER — Ambulatory Visit (HOSPITAL_COMMUNITY)
Admission: RE | Admit: 2022-01-04 | Discharge: 2022-01-04 | Disposition: A | Discharge status: Home / Self Care | Payer: Self-pay | Source: Ambulatory Visit | Attending: Adult Health | Admitting: Adult Health

## 2022-01-04 ENCOUNTER — Encounter: Payer: Self-pay | Admitting: Physical Therapy

## 2022-01-04 DIAGNOSIS — Z170 Estrogen receptor positive status [ER+]: Secondary | ICD-10-CM | POA: Insufficient documentation

## 2022-01-04 DIAGNOSIS — C50412 Malignant neoplasm of upper-outer quadrant of left female breast: Secondary | ICD-10-CM | POA: Insufficient documentation

## 2022-01-04 MED ORDER — GADOBUTROL 1 MMOL/ML IV SOLN
8.0000 mL | Freq: Once | INTRAVENOUS | Status: AC | PRN
  Administered 2022-01-04: 8 mL via INTRAVENOUS

## 2022-01-06 ENCOUNTER — Telehealth: Payer: Self-pay

## 2022-01-06 NOTE — Telephone Encounter (Signed)
-----   Message from Gardenia Phlegm, NP sent at 01/06/2022  7:55 AM EDT ----- ?Regarding: FW: ?Mri normal, no worrisome lesions noted ?----- Message ----- ?From: Interface, Rad Results In ?Sent: 01/04/2022   9:15 PM EDT ?To: Gardenia Phlegm, NP ? ?

## 2022-01-06 NOTE — Telephone Encounter (Signed)
[name], interpreter per NP to give MRI results. Advised that pt will have appt 5/2 at 1315. [name] will make pt aware.  ?

## 2022-01-10 ENCOUNTER — Other Ambulatory Visit: Payer: Self-pay

## 2022-01-10 DIAGNOSIS — Z9889 Other specified postprocedural states: Secondary | ICD-10-CM

## 2022-01-11 ENCOUNTER — Ambulatory Visit (INDEPENDENT_AMBULATORY_CARE_PROVIDER_SITE_OTHER): Payer: Self-pay | Admitting: Physical Therapy

## 2022-01-11 ENCOUNTER — Other Ambulatory Visit: Payer: Self-pay | Admitting: *Deleted

## 2022-01-11 ENCOUNTER — Encounter: Payer: Self-pay | Admitting: Physical Therapy

## 2022-01-11 ENCOUNTER — Inpatient Hospital Stay: Payer: No Typology Code available for payment source | Admitting: Adult Health

## 2022-01-11 ENCOUNTER — Telehealth: Payer: Self-pay | Admitting: Adult Health

## 2022-01-11 DIAGNOSIS — M6281 Muscle weakness (generalized): Secondary | ICD-10-CM

## 2022-01-11 DIAGNOSIS — M546 Pain in thoracic spine: Secondary | ICD-10-CM

## 2022-01-11 DIAGNOSIS — R293 Abnormal posture: Secondary | ICD-10-CM

## 2022-01-11 DIAGNOSIS — M5459 Other low back pain: Secondary | ICD-10-CM

## 2022-01-11 NOTE — Therapy (Signed)
?OUTPATIENT PHYSICAL THERAPY TREATMENT NOTE ? ? ?Patient Name: [name] ?MRN: [id number] ?DOB:12-20-1975, 46 y.o., female ?Today's Date: 01/11/2022 ? ?PCP: Teena Dunk, NP ?REFERRING PROVIDER: Pete Pelt, PA-C ? ?END OF SESSION:  ? PT End of Session - 01/11/22 1634   ? ? Visit Number 4   ? Number of Visits 12   ? Date for PT Re-Evaluation 03/16/22   ? Authorization Type CAFA expires 7/28   ? PT Start Time 1600   ? PT Stop Time 1640   ? PT Time Calculation (min) 40 min   ? Activity Tolerance Patient tolerated treatment well   ? Behavior During Therapy [facility] for tasks assessed/performed   ? ?  ?  ? ?  ? ? ? ?Past Medical History:  ?Diagnosis Date  ? Allergy   ? Asthma   ? Breast cancer in female [facility])   ? Left  ? Breast disorder   ? breast cancer May 2021  ? Chronic back pain   ? History of breast cancer   ? History of COVID-19 04/20/2021  ? Hx of migraines   ? Morbid obesity ([place])   ? ?Past Surgical History:  ?Procedure Laterality Date  ? BREAST LUMPECTOMY WITH RADIOACTIVE SEED AND SENTINEL LYMPH NODE BIOPSY Left 07/15/2020  ? Procedure: LEFT BREAST LUMPECTOMY WITH RADIOACTIVE SEED AND LEFT AXILLARY SENTINEL LYMPH NODE BIOPSY, LEFT AXILLARY NODE RADIOACTIVE SEED GUIDED EXCISION, LEFT BREAST RADIOACTIVE SEED GUIDED EXCISION IM NODE;  Surgeon: Rolm Bookbinder, MD;  Location: [place];  Service: General;  Laterality: Left;  PEC BLOCK  ? CESAREAN SECTION WITH BILATERAL TUBAL LIGATION Bilateral 07/23/2015  ? Procedure: CESAREAN SECTION WITH BILATERAL TUBAL LIGATION;  Surgeon: Donnamae Jude, MD;  Location: [place] [facility];  Service: Obstetrics;  Laterality: Bilateral;  ? PORT-A-CATH REMOVAL N/A 04/30/2021  ? Procedure: REMOVAL PORT-A-CATH;  Surgeon: Rolm Bookbinder, MD;  Location: [place];  Service: General;  Laterality: N/A;  ? PORTACATH PLACEMENT Right 02/11/2020  ? Procedure: INSERTION PORT-A-CATH WITH ULTRASOUND GUIDANCE;  Surgeon: Rolm Bookbinder, MD;  Location: [place];  Service: General;  Laterality: Right;  ? TUBAL LIGATION    ? ?Patient Active Problem List  ? Diagnosis Date Noted  ? Language barrier 12/11/2020  ? Morbid obesity ([place])   ? History of breast cancer   ? Genetic testing 02/13/2020  ? Malignant neoplasm of upper-outer quadrant of left breast in female, estrogen receptor positive ([place]) 01/27/2020  ? Impaired ability to use community resources due to language barrier 07/13/2013  ? ? ?REFERRING PROVIDER: Pete Pelt, PA-C ?  ?REFERRING DIAG: M54.50,G89.29 (ICD-10-CM) - Chronic low back pain, unspecified back pain laterality, unspecified whether sciatica present ?M54.6 (ICD-10-CM) - Thoracic back pain, unspecified back pain laterality, unspecified chronicity ? ?ONSET DATE: 2 year onset of pain ? ?THERAPY DIAG:  ?Pain in thoracic spine ? ?Other low back pain ? ?Muscle weakness (generalized) ? ?Abnormal posture ? ?PERTINENT HISTORY: NGE:XBMWUX Ca in remission,chronic back pain,obesity ? ?PRECAUTIONS: None ? ?SUBJECTIVE:  Pt reporting pain today is 5/10 in mid back and shoulders.  ? ?Communication with interpreter service.  ? ?PAIN:  ?Are you having pain? Yes:  ?NPRS scale: 5/10 ?Pain location: L4-T6  superior area more intense  ?Pain description: tense and  hard to sleep ?Aggravating factors: constant ?Relieving factors: nothing ? ? ?OBJECTIVE:  ?  ?DIAGNOSTIC FINDINGS:  ?12/22/2021 XR in chart, thoracic shows mild degenerative changes no acute, lumbar XR negative ?  ?PATIENT SURVEYS:  ?  12/22/2021 FOTO 45% functional intake ?  ?SCREENING FOR RED FLAGS: ?Bowel or bladder incontinence: No ?Cauda equina syndrome: No ?Compression fracture: No ?  ?  ?COGNITION: ?          12/22/2021 Overall cognitive status: Within functional limits for tasks assessed               ?           ?SENSATION: ?12/22/2021 WFL ?  ?POSTURE:  ?12/22/2021 Slumped posture, thoracic kyphosis ?  ?PALPATION: ?12/22/2021 TTP widespead in T-L spine but most severe around T6-7 ?  ?LUMBAR ROM:  ?  ?Active   A/PROM  ?12/22/2021 AROM ?12/31/2021  ?Flexion 75% pain Movement to toes c increased pressure   ?Extension 25% pain 50% ERP lumbar  ?Right lateral flexion 75% pain   ?Left lateral flexion 75% pain   ?Right rotation 75% pain   ?Left rotation 75% pain   ? (Blank rows = not tested) ?  ?LE ROM:  ?12/22/2021 WFL  ?  ? ?LE MMT:  ?12/22/2021: grossly 4+, UE MMT:  ?  ? (Blank rows = not tested) ?  ?LUMBAR SPECIAL TESTS:  ? 12/22/2021 Slump test negative bilat, SLR test negative on Rt and + on left ?  ?FUNCTIONAL TESTS:  ?   ?GAIT: ?12/22/2021: Comments: WFL ?  ?  ?  ?TODAY'S TREATMENT  ?01/11/2022: ? Therex: ?  UBE L5 UE/LE X 5 min ?  Standing rows and extensions green 2 x 15 each bilat ?  Standing bilat ER red 2 x 10 ?  Standing horizontal abd with red 2 x 10 ?  Supine LTR stretch 10 sec x 5 bilateral ?  Door stretch 90 degrees x 4 holding 20 seconds ?  Standing red ball rolling up wall into max shoulder flexion 5 sec X 10 ?  Supine bridge 5 sec hold 2X10 ?  Supine marching alternating x 10 using ball for isometric hold 3 sec ?  Sidelying Thoracic rotations 5 sec hold X 10 bilat ?             Cat/Camel: x 10 holding 10 seconds ? Manual therapy: Percussive device to thoracolumbar paraspinals in prone x 8 minutes ? ? ? ?01/03/2022: ? Therex: ?  UBE L5 UE/LE X 5 min ?  Standing rows and extensions green 2X10 each bilat ?  Standing bilat ER red 2X10 ?  Standing H abd with red 2X10 ?  Supine LTR stretch 10 sec x 5 bilateral ?  Standing "L" thoracic stretch 10 sec  x 10 ?  Standing red ball rolling up wall into max shoulder flexion 5 sec X 10 ?  Supine bridge 5 sec hold 2X10 ?  Supine marching alternating x 10 using ball for isometric hold 3 sec ?  Sidelying Thoracic rotations 5 sec hold x 5 bilateral ? Manual therapy: Percussive device to thoracolumbar paraspinals in prone  ? ?12/31/2021: ? Therex: ?  Supine LTR stretch 15 sec x 3 bilateral ?  Supine bridge 2-3 sec hold x 15 ?  Supine green band clam shells x 20 bilateral  ?  Supine  marching alternating 2 x 10 c transverse ab contraction hold ?  Seated pball press down for ab activation 5 sec hold x 10 ?  Seated pball hip adductor squeeze 5 sec hold x 10 ? ? Estim:  IFC lower thoracic/upper lumbar region to tolerance 10 mins c moist heat to area in sitting.  ? ? ?  ?  ?  PATIENT EDUCATION:  ?12/31/2021 ?Education details: HEP progression  ?Person educated: Patient ?Education method: Explanation, Demonstration, Verbal cues, and Handouts ?Education comprehension: verbalized understanding ?  ?  ?HOME EXERCISE PROGRAM: ?12/31/2021: ?Access Code: [email or web address] ?URL: [email or web address] ?Date: 12/31/2021 ?Prepared by: Scot Jun ? ?Exercises ?- Seated Upper Thoracic Stretch  - 2 x daily - 6 x weekly - 1 sets - 10 reps - 5 hold ?- Standing Thoracic Spine Stretch  - 2 x daily - 6 x weekly - 1 sets - 10 reps - 5 hold ?- Sidelying Thoracic Rotation with Open Book  - 2 x daily - 6 x weekly - 1 sets - 10 reps - 5 sec hold ?- Standing Scapular Retraction with External Rotation  - 2 x daily - 6 x weekly - 3 sets - 10 reps ?- Supine March with Posterior Pelvic Tilt  - 2 x daily - 7 x weekly - 3 sets - 10 reps ?- Supine Lower Trunk Rotation  - 2-3 x daily - 7 x weekly - 1 sets - 3-5 reps - 15 hold ?  ?ASSESSMENT: ?  ?CLINICAL IMPRESSION:  ?Pt tolerating exercises well with complaints of mild shoulder pain with abduction activities. Pt still with tightness noted in her thoracic paraspinals. Percussion performed with mild symptoms relief at end of session. Continue skilled PT.  ?  ?OBJECTIVE IMPAIRMENTS: decreased activity tolerance, difficulty walking, decreased balance, decreased endurance, decreased mobility, decreased ROM, decreased strength, impaired flexibility, impaired UE/LE use, postural dysfunction, and pain. ?  ?ACTIVITY LIMITATIONS: bending, lifting, carry, locomotion, cleaning, community activity, driving, and or occupation ?  ?PERSONAL FACTORS :breast Ca in remision,chronic back  pain,obesity, are also affecting patient's functional outcome. ?  ?REHAB POTENTIAL: Fair   ?  ?CLINICAL DECISION MAKING: Evolving/moderate complexity ?  ?EVALUATION COMPLEXITY: Moderate ?  ?  ?  ?GOALS: ?Short term

## 2022-01-13 ENCOUNTER — Other Ambulatory Visit: Payer: Self-pay

## 2022-01-13 ENCOUNTER — Inpatient Hospital Stay: Payer: Self-pay | Attending: Physician Assistant | Admitting: Hematology and Oncology

## 2022-01-13 VITALS — BP 127/86 | HR 88 | Temp 97.8°F | Wt 192.2 lb

## 2022-01-13 DIAGNOSIS — Z170 Estrogen receptor positive status [ER+]: Secondary | ICD-10-CM | POA: Insufficient documentation

## 2022-01-13 DIAGNOSIS — E669 Obesity, unspecified: Secondary | ICD-10-CM | POA: Insufficient documentation

## 2022-01-13 DIAGNOSIS — Z9221 Personal history of antineoplastic chemotherapy: Secondary | ICD-10-CM | POA: Insufficient documentation

## 2022-01-13 DIAGNOSIS — Z8616 Personal history of COVID-19: Secondary | ICD-10-CM | POA: Insufficient documentation

## 2022-01-13 DIAGNOSIS — M549 Dorsalgia, unspecified: Secondary | ICD-10-CM | POA: Insufficient documentation

## 2022-01-13 DIAGNOSIS — C50412 Malignant neoplasm of upper-outer quadrant of left female breast: Secondary | ICD-10-CM | POA: Insufficient documentation

## 2022-01-13 DIAGNOSIS — L539 Erythematous condition, unspecified: Secondary | ICD-10-CM

## 2022-01-13 DIAGNOSIS — Z923 Personal history of irradiation: Secondary | ICD-10-CM | POA: Insufficient documentation

## 2022-01-13 DIAGNOSIS — J45909 Unspecified asthma, uncomplicated: Secondary | ICD-10-CM | POA: Insufficient documentation

## 2022-01-13 NOTE — Progress Notes (Signed)
[name]: ?  ? ?[name] I, NP ?[address] [name] ?[place] [address] ? ? ?DIAGNOSIS:  Cancer Staging  ?Malignant neoplasm of upper-outer quadrant of left breast in female, estrogen receptor positive ([place]) ?Staging form: Breast, AJCC 8th Edition ?- Clinical stage from 01/29/2020: Stage IIIA (cT2, cN1, cM0, G3, ER+, PR-, HER2-) - Unsigned ?Stage prefix: Initial diagnosis ?Histologic grading system: 3 grade system ? ? ?SUMMARY OF ONCOLOGIC HISTORY: ? 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 ?            (a) chest CT scan and bone scan 02/07/2020 showed no evidence of metastatic disease ?  ?(1) neoadjuvant chemotherapy consisting of doxorubicin and cyclophosphamide in dose dense fashion x4 started 02/12/2020, completed 03/25/2020, followed by paclitaxel and carboplatin weekly x12 started 04/08/2020 and completed on 05/28/2020 ?            (a) echo 02/07/2020 shows an ejection fraction in the 55-60% range ?            (b) Epirubicin substituted for Doxorubicin starting with cycle 2 of AC secondary to allergic rash from Doxorubicin ?  ?(2) status post left lumpectomy and sentinel lymph node sampling 07/15/2020 for a ypT1b ypN0 residual invasive ductal carcinoma, grade 3, with negative margins. ?            (a) a total of 4 left axillary lymph nodes were removed ?            (b) repeat prognostic panel on the final pathology again triple negative, with an MIB-1 of 50%. ?  ?(3) adjuvant radiation completed 10/09/2020 ?            (a) sensitizing capecitabine attempted but not tolerated by the patient ?  ?(4) genetics testing 02/07/2020 through the [facility] Common Hereditary Cancers Panel found no deleterious mutations in  APC, ATM, AXIN2, BARD1, BMPR1A, BRCA1, BRCA2, BRIP1, CDH1, CDKN2A (p14ARF), CDKN2A (p16INK4a), CKD4,  CHEK2, CTNNA1, DICER1, EPCAM (Deletion/duplication testing only), GREM1 (promoter region deletion/duplication testing only), KIT, MEN1, MLH1, MSH2, MSH3, MSH6, MUTYH, NBN, NF1, NHTL1, PALB2, PDGFRA, PMS2, POLD1, POLE, PTEN, RAD50, RAD51C, RAD51D, RNF43, SDHB, SDHC, SDHD, SMAD4, SMARCA4. STK11, TP53, TSC1, TSC2, and VHL.  The following genes were evaluated for sequence changes only: SDHA and HOXB13 c.251G>A variant only. ?            (a) VUS in MSH6 called c.2744C>G identified.  ?  ?(5)  adjuvant pembrolizumab started 11/23/2020, to continue for 1 year ?            (a) discontinued after 01/04/2021 dose with multiple side effects requiring steroids for resolution ? ?CURRENT THERAPY: Observation ? ?INTERVAL HISTORY: ?[name] 46 y.o. female currently on observation who is here for a follow Thomas. She says the left breast has gotten larger, erythematous, heavier for the past week. The back has been erythematous for almost 6 mos. She also has  back pain for the past 1.5 yrs, this became severe in the past month. She is concerned. She has completed abx prescribed but the redness didn't get better. No fevers or chills. ?Rest of the pertinent 10 point ROS reviewed and neg. ? ?Patient Active Problem List  ? Diagnosis Date Noted  ? Language barrier 12/11/2020  ? Morbid obesity ([place])   ? History of breast cancer   ?  Genetic testing 02/13/2020  ? Malignant neoplasm of upper-outer quadrant of left breast in female, estrogen receptor positive ([place]) 01/27/2020  ? Impaired ability to use community resources due to language barrier 07/13/2013  ? ? ?is allergic to doxorubicin hcl. ? ?MEDICAL HISTORY: ?Past Medical History:  ?Diagnosis Date  ? Allergy   ? Asthma   ? Breast cancer in female [facility])   ? Left  ? Breast disorder   ? breast cancer May 2021  ? Chronic back pain   ? History of breast cancer   ? History of COVID-19 04/20/2021  ? Hx of migraines   ? Morbid obesity ([place])   ? ? ?SURGICAL HISTORY: ?Past Surgical History:   ?Procedure Laterality Date  ? BREAST LUMPECTOMY WITH RADIOACTIVE SEED AND SENTINEL LYMPH NODE BIOPSY Left 07/15/2020  ? Procedure: LEFT BREAST LUMPECTOMY WITH RADIOACTIVE SEED AND LEFT AXILLARY SENTINEL LYMPH NODE BIOPSY, LEFT AXILLARY NODE RADIOACTIVE SEED GUIDED EXCISION, LEFT BREAST RADIOACTIVE SEED GUIDED EXCISION IM NODE;  Surgeon: Rolm Bookbinder, MD;  Location: [place];  Service: General;  Laterality: Left;  PEC BLOCK  ? CESAREAN SECTION WITH BILATERAL TUBAL LIGATION Bilateral 07/23/2015  ? Procedure: CESAREAN SECTION WITH BILATERAL TUBAL LIGATION;  Surgeon: Donnamae Jude, MD;  Location: [place] [facility];  Service: Obstetrics;  Laterality: Bilateral;  ? PORT-A-CATH REMOVAL N/A 04/30/2021  ? Procedure: REMOVAL PORT-A-CATH;  Surgeon: Rolm Bookbinder, MD;  Location: [place];  Service: General;  Laterality: N/A;  ? PORTACATH PLACEMENT Right 02/11/2020  ? Procedure: INSERTION PORT-A-CATH WITH ULTRASOUND GUIDANCE;  Surgeon: Rolm Bookbinder, MD;  Location: [place];  Service: General;  Laterality: Right;  ? TUBAL LIGATION    ? ? ?SOCIAL HISTORY: ?Social History  ? ?Socioeconomic History  ? Marital status: Legally Separated  ?  Spouse name: Not on file  ? Number of children: 3  ? Years of education: Not on file  ? Highest education level: 9th grade  ?Occupational History  ? Occupation: Architect  ?Tobacco Use  ? Smoking status: Never  ? Smokeless tobacco: Never  ?Vaping Use  ? Vaping Use: Never used  ?Substance and Sexual Activity  ? Alcohol use: No  ? Drug use: Never  ? Sexual activity: Yes  ?  Birth control/protection: Surgical  ?Other Topics Concern  ? Not on file  ?Social History Narrative  ? Not on file  ? ?Social Determinants of Health  ? ?Financial Resource Strain: Not on file  ?Food Insecurity: No Food Insecurity  ? Worried About Charity fundraiser in the Last Year: Never true  ? Ran Out of Food in the Last Year: Never true  ?Transportation Needs: No Transportation Needs  ?  Lack of Transportation (Medical): No  ? Lack of Transportation (Non-Medical): No  ?Physical Activity: Not on file  ?Stress: Not on file  ?Social Connections: Not on file  ?Intimate Partner Violence: Not on file  ? ? ?FAMILY HISTORY: ?Family History  ?Problem Relation Age of Onset  ? Heart disease Maternal Grandmother   ? ? ?Review of Systems  ?Constitutional:  Negative for appetite change, chills, fatigue, fever and unexpected weight change.  ?HENT:   Negative for hearing loss, lump/mass and trouble swallowing.   ?Eyes:  Negative for eye problems and icterus.  ?Respiratory:  Negative for chest tightness, cough and shortness of breath.   ?Cardiovascular:  Negative for chest pain, leg swelling and palpitations.  ?Gastrointestinal:  Negative for abdominal distention, abdominal pain, constipation, diarrhea, nausea and vomiting.  ?Endocrine: Negative  for hot flashes.  ?Genitourinary:  Negative for difficulty urinating.   ?Musculoskeletal:  Positive for back pain. Negative for arthralgias and gait problem.  ?Skin:  Positive for rash (redness of left breast and back.). Negative for itching.  ?Neurological:  Negative for dizziness, extremity weakness, gait problem, headaches, light-headedness, numbness, seizures and speech difficulty.  ?Hematological:  Negative for adenopathy. Does not bruise/bleed easily.  ?Psychiatric/Behavioral:  Negative for depression. The patient is not nervous/anxious.    ? ? ?PHYSICAL EXAMINATION ? ?ECOG PERFORMANCE STATUS: 1 - Symptomatic but completely ambulatory ? ?Vitals:  ? 01/13/22 1454  ?BP: 127/86  ?Pulse: 88  ?Temp: 97.8 ?F (36.6 ?C)  ?SpO2: 99%  ? ? ?Physical Exam ?Constitutional:   ?   General: She is not in acute distress. ?   Appearance: Normal appearance. She is not toxic-appearing.  ?HENT:  ?   Head: Normocephalic and atraumatic.  ?Eyes:  ?   General: No scleral icterus. ?Cardiovascular:  ?   Rate and Rhythm: Normal rate and regular rhythm.  ?   Pulses: Normal pulses.  ?   Heart  sounds: Normal heart sounds.  ?Pulmonary:  ?   Effort: Pulmonary effort is normal.  ?   Breath sounds: Normal breath sounds.  ?Chest:  ? ? ?   Comments: Left breast with subareolar erythema and erythema surround

## 2022-01-13 NOTE — Assessment & Plan Note (Signed)
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  She is currently on observation. ? ?She is here today for breast redness and skin rash and back pain.  ?Lindsey ordered augmentin, prednisone and MRI spine for evaluation. MRI spine didn't comment on metastatic disease. Redness however didn't resolve. Skin over the breast and flank as well some skin in the back is erythematous, indurated and tender. No palpable masses or regional adenopathy. She should be evaluated further with likely a punch biopsy to rule out inflammatory breast cancer.Will refer her back to surgery. She says its painful for her to do mammogram. ?She should RTC in 2 weeks. ?

## 2022-01-14 ENCOUNTER — Other Ambulatory Visit: Payer: Self-pay | Admitting: *Deleted

## 2022-01-14 ENCOUNTER — Telehealth: Payer: Self-pay | Admitting: *Deleted

## 2022-01-14 DIAGNOSIS — N644 Mastodynia: Secondary | ICD-10-CM

## 2022-01-14 DIAGNOSIS — C50412 Malignant neoplasm of upper-outer quadrant of left female breast: Secondary | ICD-10-CM

## 2022-01-14 NOTE — Progress Notes (Signed)
error 

## 2022-01-14 NOTE — Telephone Encounter (Signed)
This RN called [facility] per need for appt with Dr Donne Hazel and was informed the pt is scheduled for 5/9 at 1120. ? ?This RN called pt per [employer] service to verify that the patient is aware of appt. [place] stated she was called this AM from Dr Cristal Generous office and verified appt date and time. ? ?No other needs at this time. ?

## 2022-01-18 ENCOUNTER — Other Ambulatory Visit: Payer: Self-pay | Admitting: General Surgery

## 2022-01-18 ENCOUNTER — Other Ambulatory Visit: Payer: Self-pay | Admitting: Hematology and Oncology

## 2022-01-18 ENCOUNTER — Ambulatory Visit
Admission: RE | Admit: 2022-01-18 | Discharge: 2022-01-18 | Disposition: A | Discharge status: Home / Self Care | Payer: No Typology Code available for payment source | Source: Ambulatory Visit | Attending: Hematology and Oncology | Admitting: Hematology and Oncology

## 2022-01-18 ENCOUNTER — Ambulatory Visit
Admission: RE | Admit: 2022-01-18 | Discharge: 2022-01-18 | Disposition: A | Discharge status: Home / Self Care | Payer: Self-pay | Source: Ambulatory Visit | Attending: Hematology and Oncology | Admitting: Hematology and Oncology

## 2022-01-18 ENCOUNTER — Encounter: Payer: Self-pay | Admitting: Rehabilitative and Restorative Service Providers"

## 2022-01-18 DIAGNOSIS — C50412 Malignant neoplasm of upper-outer quadrant of left female breast: Secondary | ICD-10-CM

## 2022-01-18 DIAGNOSIS — N644 Mastodynia: Secondary | ICD-10-CM

## 2022-01-18 HISTORY — DX: Personal history of antineoplastic chemotherapy: Z92.21

## 2022-01-18 HISTORY — DX: Personal history of irradiation: Z92.3

## 2022-01-20 ENCOUNTER — Other Ambulatory Visit: Payer: Self-pay

## 2022-01-25 ENCOUNTER — Telehealth: Payer: Self-pay | Admitting: *Deleted

## 2022-01-25 NOTE — Telephone Encounter (Signed)
Requested prognostic panel to be run on punch bx ?

## 2022-01-28 ENCOUNTER — Ambulatory Visit (HOSPITAL_BASED_OUTPATIENT_CLINIC_OR_DEPARTMENT_OTHER)
Admission: RE | Admit: 2022-01-28 | Discharge: 2022-01-28 | Disposition: A | Discharge status: Home / Self Care | Payer: No Typology Code available for payment source | Source: Ambulatory Visit | Attending: Hematology and Oncology | Admitting: Hematology and Oncology

## 2022-01-28 ENCOUNTER — Ambulatory Visit (HOSPITAL_BASED_OUTPATIENT_CLINIC_OR_DEPARTMENT_OTHER)
Admission: RE | Admit: 2022-01-28 | Discharge: 2022-01-28 | Disposition: A | Discharge status: Home / Self Care | Payer: Self-pay | Source: Ambulatory Visit | Attending: Hematology and Oncology | Admitting: Hematology and Oncology

## 2022-01-28 ENCOUNTER — Encounter: Payer: Self-pay | Admitting: Hematology and Oncology

## 2022-01-28 ENCOUNTER — Telehealth: Payer: Self-pay | Admitting: *Deleted

## 2022-01-28 ENCOUNTER — Inpatient Hospital Stay: Payer: No Typology Code available for payment source

## 2022-01-28 ENCOUNTER — Encounter (HOSPITAL_BASED_OUTPATIENT_CLINIC_OR_DEPARTMENT_OTHER): Payer: Self-pay | Admitting: Emergency Medicine

## 2022-01-28 ENCOUNTER — Inpatient Hospital Stay (HOSPITAL_BASED_OUTPATIENT_CLINIC_OR_DEPARTMENT_OTHER): Payer: No Typology Code available for payment source | Admitting: Hematology and Oncology

## 2022-01-28 ENCOUNTER — Emergency Department (HOSPITAL_BASED_OUTPATIENT_CLINIC_OR_DEPARTMENT_OTHER)
Admission: EM | Admit: 2022-01-28 | Discharge: 2022-01-28 | Disposition: A | Discharge status: Home / Self Care | Payer: No Typology Code available for payment source | Attending: Emergency Medicine | Admitting: Emergency Medicine

## 2022-01-28 ENCOUNTER — Other Ambulatory Visit: Payer: Self-pay

## 2022-01-28 ENCOUNTER — Other Ambulatory Visit: Payer: Self-pay | Admitting: *Deleted

## 2022-01-28 VITALS — BP 122/84 | HR 79 | Temp 97.9°F | Resp 16 | Ht 60.0 in | Wt 191.1 lb

## 2022-01-28 DIAGNOSIS — C50919 Malignant neoplasm of unspecified site of unspecified female breast: Secondary | ICD-10-CM

## 2022-01-28 DIAGNOSIS — C50412 Malignant neoplasm of upper-outer quadrant of left female breast: Secondary | ICD-10-CM

## 2022-01-28 DIAGNOSIS — L299 Pruritus, unspecified: Secondary | ICD-10-CM | POA: Insufficient documentation

## 2022-01-28 DIAGNOSIS — Z170 Estrogen receptor positive status [ER+]: Secondary | ICD-10-CM | POA: Insufficient documentation

## 2022-01-28 DIAGNOSIS — T7840XA Allergy, unspecified, initial encounter: Secondary | ICD-10-CM

## 2022-01-28 LAB — PREGNANCY, URINE: Preg Test, Ur: NEGATIVE

## 2022-01-28 LAB — CMP (CANCER CENTER ONLY)
ALT: 18 U/L (ref 0–44)
AST: 20 U/L (ref 15–41)
Albumin: 4.5 g/dL (ref 3.5–5.0)
Alkaline Phosphatase: 92 U/L (ref 38–126)
Anion gap: 8 (ref 5–15)
BUN: 11 mg/dL (ref 6–20)
CO2: 30 mmol/L (ref 22–32)
Calcium: 9.9 mg/dL (ref 8.9–10.3)
Chloride: 103 mmol/L (ref 98–111)
Creatinine: 0.71 mg/dL (ref 0.44–1.00)
GFR, Estimated: >60 mL/min (ref >60)
Glucose, Bld: 104 mg/dL — ABNORMAL HIGH (ref 70–99)
Potassium: 3.4 mmol/L — ABNORMAL LOW (ref 3.5–5.1)
Sodium: 141 mmol/L (ref 135–145)
Total Bilirubin: 0.6 mg/dL (ref 0.3–1.2)
Total Protein: 8 g/dL (ref 6.5–8.1)

## 2022-01-28 LAB — CBC WITH DIFFERENTIAL (CANCER CENTER ONLY)
Abs Immature Granulocytes: 0.02 10*3/uL (ref 0.00–0.07)
Basophils Absolute: 0 10*3/uL (ref 0.0–0.1)
Basophils Relative: 1 %
Eosinophils Absolute: 0.1 10*3/uL (ref 0.0–0.5)
Eosinophils Relative: 2 %
HCT: 41.7 % (ref 36.0–46.0)
Hemoglobin: 14 g/dL (ref 12.0–15.0)
Immature Granulocytes: 0 %
Lymphocytes Relative: 37 %
Lymphs Abs: 2.1 10*3/uL (ref 0.7–4.0)
MCH: 28.5 pg (ref 26.0–34.0)
MCHC: 33.6 g/dL (ref 30.0–36.0)
MCV: 84.9 fL (ref 80.0–100.0)
Monocytes Absolute: 0.4 10*3/uL (ref 0.1–1.0)
Monocytes Relative: 7 %
Neutro Abs: 3 10*3/uL (ref 1.7–7.7)
Neutrophils Relative %: 53 %
Platelet Count: 220 10*3/uL (ref 150–400)
RBC: 4.91 MIL/uL (ref 3.87–5.11)
RDW: 13.2 % (ref 11.5–15.5)
WBC Count: 5.7 10*3/uL (ref 4.0–10.5)
nRBC: 0 % (ref 0.0–0.2)

## 2022-01-28 LAB — POCT I-STAT CREATININE: Creatinine, Ser: 0.7 mg/dL (ref 0.44–1.00)

## 2022-01-28 LAB — LACTATE DEHYDROGENASE: LDH: 136 U/L (ref 98–192)

## 2022-01-28 MED ORDER — EPINEPHRINE 0.3 MG/0.3ML IJ SOAJ: 0.3000 mg | INTRAMUSCULAR | 2 refills | Status: DC | PRN

## 2022-01-28 MED ORDER — GADOBUTROL 1 MMOL/ML IV SOLN
8.6000 mL | Freq: Once | INTRAVENOUS | Status: AC | PRN
  Administered 2022-01-28: 8.6 mL via INTRAVENOUS
  Filled 2022-01-28: qty 10

## 2022-01-28 MED ORDER — IOHEXOL 300 MG/ML  SOLN
100.0000 mL | Freq: Once | INTRAMUSCULAR | Status: AC | PRN
  Administered 2022-01-28: 100 mL via INTRAVENOUS

## 2022-01-28 MED ORDER — ACETAMINOPHEN 325 MG PO TABS
650.0000 mg | ORAL_TABLET | Freq: Once | ORAL | Status: AC
  Administered 2022-01-28: 650 mg via ORAL
  Filled 2022-01-28: qty 2

## 2022-01-28 MED ORDER — IBUPROFEN 400 MG PO TABS
600.0000 mg | ORAL_TABLET | Freq: Once | ORAL | Status: AC
  Administered 2022-01-28: 600 mg via ORAL
  Filled 2022-01-28: qty 1

## 2022-01-28 NOTE — ED Triage Notes (Signed)
Pt from outpatient at CT. Pt given IV contrast. Pt stated that she felt ichthy all over and that her throat was slightly swollen at the time the contrast was given. Pt states that the itching and swelling in her throat is now going away. Pt at 98% on room air with a calm disposition; unlabored equal expansion of chest..   Pt stated that this has happened on time before about a year ago bu the symptoms where worse last year. Pt was observed and then released

## 2022-01-28 NOTE — Telephone Encounter (Signed)
Requested prognostics on patients recent punch biopsy.

## 2022-01-28 NOTE — Progress Notes (Signed)
[facility] Follow up:    [name] I, NP [place] [address] 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             (a) chest CT scan and bone scan 02/07/2020 showed no evidence of metastatic disease   (1) neoadjuvant chemotherapy consisting of doxorubicin and cyclophosphamide in dose dense fashion x4 started 02/12/2020, completed 03/25/2020, followed by paclitaxel and carboplatin weekly x12 started 04/08/2020 and completed on 05/28/2020             (a) echo 02/07/2020 shows an ejection fraction in the 55-60% range             (b) Epirubicin substituted for Doxorubicin starting with cycle 2 of AC secondary to allergic rash from Doxorubicin   (2) status post left lumpectomy and sentinel lymph node sampling 07/15/2020 for a ypT1b ypN0 residual invasive ductal carcinoma, grade 3, with negative margins.             (a) a total of 4 left axillary lymph nodes were removed             (b) repeat prognostic panel on the final pathology again triple negative, with an MIB-1 of 50%.   (3) adjuvant radiation completed 10/09/2020             (a) sensitizing capecitabine attempted but not tolerated by the patient   (4) genetics testing 02/07/2020 through the [facility] Common Hereditary Cancers Panel found no deleterious mutations in  APC, ATM, AXIN2, BARD1, BMPR1A, BRCA1, BRCA2, BRIP1, CDH1, CDKN2A (p14ARF), CDKN2A (p16INK4a), CKD4,  CHEK2, CTNNA1, DICER1, EPCAM (Deletion/duplication testing only), GREM1 (promoter region deletion/duplication testing only), KIT, MEN1, MLH1, MSH2, MSH3, MSH6, MUTYH, NBN, NF1, NHTL1, PALB2, PDGFRA, PMS2, POLD1, POLE, PTEN, RAD50, RAD51C, RAD51D, RNF43, SDHB, SDHC, SDHD, SMAD4, SMARCA4. STK11, TP53, TSC1, TSC2, and VHL.  The following genes were evaluated for sequence changes only: SDHA and HOXB13 c.251G>A variant only.             (a) VUS in MSH6 called c.2744C>G identified.    (5)  adjuvant pembrolizumab started 11/23/2020, to continue for 1 year             (a) discontinued after 01/04/2021 dose with multiple side effects requiring steroids for resolution  CURRENT THERAPY: Observation  INTERVAL HISTORY: Jessicca Stitzer 46 y.o. female currently on observation who is here for a follow up.  She says the left breast has gotten larger, erythematous, heavier for the past week. The back has been erythematous for almost 6 mos. She also has  back pain for the past 1.5 yrs, this became severe in the past month.   She came in today to follow-up on the punch biopsy results.  As per certified [place] interpreter present during the conversation.  She tells me that she does not feel well at all, has this excruciating back pain in between her shoulder blades, has an ongoing headache.  Her left breast feels  very heavy, there is a dragging pain in the left arm. No other new skin rash.  Rest of the pertinent review of systems reviewed and negative  Patient Active Problem List   Diagnosis Date Noted   Language barrier 12/11/2020   Morbid obesity ([place])    History of breast cancer    Genetic testing 02/13/2020   Malignant neoplasm of upper-outer quadrant of left breast in female, estrogen receptor positive ([place]) 01/27/2020   Impaired ability to use community resources due to language barrier 07/13/2013    is allergic to doxorubicin hcl.  MEDICAL HISTORY: Past Medical History:  Diagnosis Date    Allergy    Asthma    Breast cancer in female [facility])    Left   Breast disorder    breast cancer May 2021   Chronic back pain    History of breast cancer    History of COVID-19 04/20/2021   Hx of migraines    Morbid obesity ([place])    Personal history of chemotherapy    Personal history of radiation therapy     SURGICAL HISTORY: Past Surgical History:  Procedure Laterality Date   BREAST LUMPECTOMY     BREAST LUMPECTOMY WITH RADIOACTIVE SEED AND SENTINEL LYMPH NODE BIOPSY Left 07/15/2020   Procedure: LEFT BREAST LUMPECTOMY WITH RADIOACTIVE SEED AND LEFT AXILLARY SENTINEL LYMPH NODE BIOPSY, LEFT AXILLARY NODE RADIOACTIVE SEED GUIDED EXCISION, LEFT BREAST RADIOACTIVE SEED GUIDED EXCISION IM NODE;  Surgeon: Rolm Bookbinder, MD;  Location: [facility];  Service: General;  Laterality: Left;  PEC BLOCK   CESAREAN SECTION WITH BILATERAL TUBAL LIGATION Bilateral 07/23/2015   Procedure: CESAREAN SECTION WITH BILATERAL TUBAL LIGATION;  Surgeon: Donnamae Jude, MD;  Location: [facility];  Service: Obstetrics;  Laterality: Bilateral;   PORT-A-CATH REMOVAL N/A 04/30/2021   Procedure: REMOVAL PORT-A-CATH;  Surgeon: Rolm Bookbinder, MD;  Location: [facility];  Service: General;  Laterality: N/A;   PORTACATH PLACEMENT Right 02/11/2020   Procedure: INSERTION PORT-A-CATH WITH ULTRASOUND GUIDANCE;  Surgeon: Rolm Bookbinder, MD;  Location: [facility];  Service: General;  Laterality: Right;   TUBAL LIGATION      SOCIAL HISTORY: Social History   Socioeconomic History   Marital status: Legally Separated    Spouse name: Not on file   Number of children: 3   Years of education: Not on file   Highest education level: 9th grade  Occupational History   Occupation: Architect  Tobacco Use   Smoking status: Never   Smokeless tobacco: Never  Vaping Use   Vaping Use: Never used  Substance and Sexual Activity   Alcohol use: No   Drug use: Never   Sexual activity: Yes    Birth  control/protection: Surgical  Other Topics Concern   Not on file  Social History Narrative   Not on file   Social Determinants of Health   Financial Resource Strain: Not on file  Food Insecurity: No Food Insecurity   Worried About Running Out of Food in the Last Year: Never true   Ran Out of Food in the Last Year: Never true  Transportation Needs: No Transportation Needs   Lack of Transportation (Medical): No   Lack of Transportation (Non-Medical): No  Physical Activity: Not on file  Stress: Not on file  Social Connections: Not on file  Intimate Partner Violence: Not on file    FAMILY HISTORY: Family History  Problem Relation Age of Onset   Heart disease Maternal Grandmother     Review of  Systems  Constitutional:  Negative for appetite change, chills, fatigue, fever and unexpected weight change.  HENT:   Negative for hearing loss, lump/mass and trouble swallowing.   Eyes:  Negative for eye problems and icterus.  Respiratory:  Negative for chest tightness, cough and shortness of breath.   Cardiovascular:  Negative for chest pain, leg swelling and palpitations.  Gastrointestinal:  Negative for abdominal distention, abdominal pain, constipation, diarrhea, nausea and vomiting.  Endocrine: Negative for hot flashes.  Genitourinary:  Negative for difficulty urinating.   Musculoskeletal:  Positive for back pain. Negative for arthralgias and gait problem.  Skin:  Positive for rash (redness of left breast and back.). Negative for itching.  Neurological:  Negative for dizziness, extremity weakness, gait problem, headaches, light-headedness, numbness, seizures and speech difficulty.  Hematological:  Negative for adenopathy. Does not bruise/bleed easily.  Psychiatric/Behavioral:  Negative for depression. The patient is not nervous/anxious.      PHYSICAL EXAMINATION  ECOG PERFORMANCE STATUS: 1 - Symptomatic but completely ambulatory  Vitals:   01/28/22 1319  BP: 122/84  Pulse: 79   Resp: 16  Temp: 97.9 F (36.6 C)  SpO2: 99%    Physical Exam Constitutional:      General: She is not in acute distress.    Appearance: Normal appearance. She is not toxic-appearing.  HENT:     Head: Normocephalic and atraumatic.  Eyes:     General: No scleral icterus. Cardiovascular:     Rate and Rhythm: Normal rate and regular rhythm.     Pulses: Normal pulses.     Heart sounds: Normal heart sounds.  Pulmonary:     Effort: Pulmonary effort is normal.     Breath sounds: Normal breath sounds.  Chest:       Comments: Erythema of the left breast appears to have progressed since her last visit.  She also has a rash on her back both upper and lower back with some induration of the skin.  No palpable masses.  Punch biopsy site noted.  No regional lymphadenopathy Abdominal:     General: Abdomen is flat. Bowel sounds are normal. There is no distension.     Palpations: Abdomen is soft.     Tenderness: There is no abdominal tenderness.  Musculoskeletal:        General: No swelling.     Cervical back: Neck supple.  Lymphadenopathy:     Cervical: No cervical adenopathy.     Upper Body:     Left upper body: No pectoral adenopathy.  Skin:    General: Skin is warm and dry.     Findings: No rash.  Neurological:     General: No focal deficit present.     Mental Status: She is alert.  Psychiatric:        Mood and Affect: Mood normal.        Behavior: Behavior normal.    LABORATORY DATA:  None for this visit  Pictures in media   ASSESSMENT and PLAN:   Malignant neoplasm of upper-outer quadrant of left breast in female, estrogen receptor positive ([place]) Clessie is a 46 year old Spanish-speaking woman who has a 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  She was most recently seen for ongoing breast redness, given a course of antibiotics but had persistent redness hence we proceeded with a punch biopsy.  This showed the presence of inflammatory breast cancer,  prognostic showed ER/PR and HER2 negative however proliferation index was noted low at 1%.  The tissue sample was thought to  be scant.  I have explained to her about the pathology and this was understandably shocking news to her.  She was very tearful, emotional, talked about her 3 daughters and how she does not have any social support.  She lives with her husband who is separated but present to support her and her 3 kids.  She also reports worsening headache, worsening back pain and lack of energy today.  We have discussed about proceeding with staging scans CT chest abdomen pelvis with contrast and MRI brain ordered. She also agreed to port placement for consideration of chemotherapy.  She understands that we have to make sure that she does not have metastatic disease, intent of treatment was not discussed today since we do not have all the information.  However since this is likely a triple negative breast cancer as discussed in the prognostics, she most likely will need chemotherapy again.  If this is not metastatic, we can consider neoadjuvant chemotherapy followed by mastectomy.  She will return to clinic in about a week, hopefully has her scans and port placed in the meantime. We have discussed about referral to social worker for support.    Total encounter time:40 minutes*in face-to-face visit time, chart review, lab review, care coordination, order entry, and documentation of the encounter time.  *Total Encounter Time as defined by the Centers for Medicare and Medicaid Services includes, in addition to the face-to-face time of a patient visit (documented in the note above) non-face-to-face time: obtaining and reviewing outside history, ordering and reviewing medications, tests or procedures, care coordination (communications with other health care professionals or caregivers) and documentation in the medical record.

## 2022-01-28 NOTE — Assessment & Plan Note (Signed)
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  She was most recently seen for ongoing breast redness, given a course of antibiotics but had persistent redness hence we proceeded with a punch biopsy.  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  The tissue sample was thought to be scant.  I have explained to her about the pathology and this was understandably shocking news to her.  She was very tearful, emotional, talked about her 3 daughters and how she does not have any social support.  She lives with her husband who is separated but present to support her and her 3 kids.  She also reports worsening headache, worsening back pain and lack of energy today.  We have discussed about proceeding with staging scans CT chest abdomen pelvis with contrast and MRI brain ordered. She also agreed to port placement for consideration of chemotherapy.  She understands that we have to make sure that she does not have metastatic disease, intent of treatment was not discussed today since we do not have all the information.  However since this is likely a triple negative breast cancer as discussed in the prognostics, she most likely will need chemotherapy again.  If this is not metastatic, we can consider neoadjuvant chemotherapy followed by mastectomy.  She will return to clinic in about a week, hopefully has her scans and port placed in the meantime. We have discussed about referral to social worker for support.

## 2022-01-28 NOTE — ED Notes (Signed)
Lungs clear bilaterally.  Good air flow through neck.  States has some itchiness in throat but getting better

## 2022-01-28 NOTE — ED Provider Notes (Signed)
[facility] EMERGENCY DEPT Provider Note   CSN: [id number] Arrival date & time: 01/28/22  1740     History {Add pertinent medical, surgical, social history, OB history to HPI:1} Chief Complaint  Patient presents with   Allergic Reaction    [name] is a 46 y.o. female presenting to the emergency department with concern for allergic reaction.  A Spanish translator was used for the entirety of my exam.  The patient reports he had a CT scan performed with IV contrast today, had forgotten that she had allergies to contrast, and immediately after the scan she began having itching all over her body as well as swelling in her throat.  She says this occurred about 3 hours ago.  Since arrival her symptoms have completely resolved.  She said she has had similar reactions to contrast in the past.  She may possibly have certain fruit or not allergies, does not know for certain, but does not carry an EpiPen.  HPI     Home Medications Prior to Admission medications   Medication Sig Start Date End Date Taking? Authorizing Provider  albuterol (VENTOLIN HFA) 108 (90 Base) MCG/ACT inhaler Inhale 1-2 puffs into the lungs every 4 (four) hours as needed for wheezing or shortness of breath. 12/16/20   Tanner, Lyndon Code., PA-C  amoxicillin-clavulanate (AUGMENTIN) 875-125 MG tablet Take 1 tablet by mouth 2 (two) times daily. 01/03/22   Gardenia Phlegm, NP  atorvastatin (LIPITOR) 40 MG tablet Take 1 tablet (40 mg total) by mouth daily. 10/04/21 04/02/22  [name] I, NP  cyclobenzaprine (FLEXERIL) 10 MG tablet Take 1 tablet (10 mg total) by mouth 3 (three) times daily as needed for muscle spasms. 11/29/21   Passmore, Jake Church I, NP  loratadine (CLARITIN) 10 MG tablet Take 1 tablet (10 mg total) by mouth daily. 12/14/20   Magrinat, Virgie Dad, MD  predniSONE (DELTASONE) 10 MG tablet Take 1 tablet (10 mg total) by mouth daily with breakfast. 01/03/22   Causey, Charlestine Massed, NP       Allergies    Omnipaque [iohexol] and Doxorubicin hcl    Review of Systems   Review of Systems  Physical Exam Updated Vital Signs BP 109/80   Pulse 83   Temp 98.3 F (36.8 C)   Resp 17   Ht '5\' 2"'$  (1.575 m)   Wt 86.6 kg   LMP 01/20/2020 (Exact Date)   SpO2 99%   BMI 34.93 kg/m  Physical Exam Constitutional:      General: She is not in acute distress. HENT:     Head: Normocephalic and atraumatic.  Eyes:     Conjunctiva/sclera: Conjunctivae normal.     Pupils: Pupils are equal, round, and reactive to light.  Cardiovascular:     Rate and Rhythm: Normal rate and regular rhythm.  Pulmonary:     Effort: Pulmonary effort is normal. No respiratory distress.     Comments: Oropharynx non-erythematous.  No tonsillar swelling or exudate.  No uvular deviation.  No drooling. No brawny edema. No stridor. Voice is not muffled. Abdominal:     General: There is no distension.     Tenderness: There is no abdominal tenderness.  Skin:    General: Skin is warm and dry.  Neurological:     General: No focal deficit present.     Mental Status: She is alert. Mental status is at baseline.  Psychiatric:        Mood and Affect: Mood normal.  Behavior: Behavior normal.    ED Results / Procedures / Treatments   Labs (all labs ordered are listed, but only abnormal results are displayed) Labs Reviewed - No data to display  EKG None  Radiology MR Brain W Wo Contrast  Result Date: 01/28/2022 CLINICAL DATA:  Inflammatory breast cancer EXAM: MRI HEAD WITHOUT AND WITH CONTRAST TECHNIQUE: Multiplanar, multiecho pulse sequences of the brain and surrounding structures were obtained without and with intravenous contrast. CONTRAST:  8.82m GADAVIST GADOBUTROL 1 MMOL/ML IV SOLN COMPARISON:  No prior MRI, correlation made with CT head 08/03/2009 FINDINGS: Brain: No abnormal parenchymal or meningeal enhancement. No restricted diffusion to suggest acute or subacute infarct. No acute hemorrhage,  mass mass effect, or midline shift. No hydrocephalus or extra-axial collection. Cerebral volume is within normal limits for age. Vascular: Normal arterial flow voids. Venous sinuses are patent on postcontrast imaging. Skull and upper cervical spine: Normal marrow signal. No abnormal enhancement. Sinuses/Orbits: Minimal mucosal thickening in the ethmoid air cells. The orbits are unremarkable. Other: Trace fluid in the mastoid air cells. IMPRESSION: No acute intracranial process. No evidence of metastatic disease in the head. Electronically Signed   By: AMerilyn BabaM.D.   On: 01/28/2022 16:40   CT CHEST ABDOMEN PELVIS W CONTRAST  Result Date: 01/28/2022 CLINICAL DATA:  Follow-up metastatic breast carcinoma. Assess treatment response. * Tracking Code: BO * EXAM: CT CHEST, ABDOMEN, AND PELVIS WITH CONTRAST TECHNIQUE: Multidetector CT imaging of the chest, abdomen and pelvis was performed following the standard protocol during bolus administration of intravenous contrast. RADIATION DOSE REDUCTION: This exam was performed according to the departmental dose-optimization program which includes automated exposure control, adjustment of the mA and/or kV according to patient size and/or use of iterative reconstruction technique. CONTRAST:  1071mOMNIPAQUE IOHEXOL 300 MG/ML  SOLN COMPARISON:  Chest CTA on 10/18/2021 FINDINGS: CT CHEST FINDINGS Cardiovascular: No acute findings. Mediastinum/Lymph Nodes: No masses or pathologically enlarged lymph nodes identified. Lungs/Pleura: No suspicious pulmonary nodules or masses identified. No evidence of infiltrate or pleural effusion. Musculoskeletal: No suspicious bone lesions identified. Increased diffuse left breast soft tissue edema and overlying skin thickening demonstrated, which could be due to inflammatory carcinoma or mastitis. CT ABDOMEN AND PELVIS FINDINGS Hepatobiliary: No masses identified. Gallbladder is unremarkable. No evidence of biliary ductal dilatation.  Pancreas:  No mass or inflammatory changes. Spleen:  Within normal limits in size and appearance. Adrenals/Urinary tract:  No masses or hydronephrosis. Stomach/Bowel: No evidence of obstruction, inflammatory process, or abnormal fluid collections. Normal appendix visualized. Vascular/Lymphatic: No pathologically enlarged lymph nodes identified. No acute vascular findings. Reproductive:  No mass or other significant abnormality identified. Other:  None. Musculoskeletal:  No suspicious bone lesions identified. IMPRESSION: Increased diffuse left breast soft tissue edema and overlying skin thickening, which could be due to inflammatory carcinoma or mastitis. No evidence of metastatic disease within the chest, abdomen, or pelvis. Electronically Signed   By: JoMarlaine Hind.D.   On: 01/28/2022 18:53    Procedures Procedures  {Document cardiac monitor, telemetry assessment procedure when appropriate:1}  Medications Ordered in ED Medications - No data to display  ED Course/ Medical Decision Making/ A&P                           Medical Decision Making  Patient presents after concern for allergic reaction to iodine contrast provided with a CT scan.  On my examination airway is intact and she is completely asymptomatic.  I do not  see an indication for epinephrine at this time.  However with her recurring history of possible throat swelling or itching, particularly with certain fruits and vegetables, I did recommend to her that we prescribe an epinephrine pen and that she carry it given instructions for when to use it.  I will also give her a phone number to contact for the allergy center for formal testing for the specific allergies.  She verbalized understanding.  A Spanish translator used for the entirety my exam.  I think she is reasonably safe and stable for discharge.  It has been 4 hours since her CT scan.  {Document critical care time when appropriate:1} {Document review of labs and clinical decision  tools ie heart score, Chads2Vasc2 etc:1}  {Document your independent review of radiology images, and any outside records:1} {Document your discussion with family members, caretakers, and with consultants:1} {Document social determinants of health affecting pt's care:1} {Document your decision making why or why not admission, treatments were needed:1} Final Clinical Impression(s) / ED Diagnoses Final diagnoses:  None    Rx / DC Orders ED Discharge Orders     None

## 2022-01-29 LAB — CANCER ANTIGEN 27.29: CA 27.29: 15.2 U/mL (ref 0.0–38.6)

## 2022-01-31 ENCOUNTER — Encounter: Payer: Self-pay | Admitting: *Deleted

## 2022-01-31 ENCOUNTER — Telehealth: Payer: Self-pay | Admitting: *Deleted

## 2022-01-31 NOTE — Telephone Encounter (Signed)
Per Dr. Chryl Heck, block send to [facility] pathology for 2nd opinion. Requisition faxed.

## 2022-02-01 ENCOUNTER — Other Ambulatory Visit: Payer: Self-pay | Admitting: Hematology and Oncology

## 2022-02-01 ENCOUNTER — Other Ambulatory Visit: Payer: Self-pay | Admitting: *Deleted

## 2022-02-01 ENCOUNTER — Telehealth: Payer: Self-pay

## 2022-02-01 DIAGNOSIS — C50919 Malignant neoplasm of unspecified site of unspecified female breast: Secondary | ICD-10-CM

## 2022-02-01 MED ORDER — TRAMADOL HCL 50 MG PO TABS
50.0000 mg | ORAL_TABLET | Freq: Three times a day (TID) | ORAL | 0 refills | Status: DC | PRN
Start: 2022-02-01 — End: 2022-06-28

## 2022-02-01 NOTE — Telephone Encounter (Signed)
-----   Message from Benay Pike, MD sent at 02/01/2022  8:04 AM EDT ----- Spanish speaking. Good news, no metastatic disease on CT and MR brain. Please let the patient know.

## 2022-02-01 NOTE — Telephone Encounter (Signed)
Patient relayed recent results via [place], [name]. Patient's recent pain and pain medication request also addressed. Patient is aware that she will be prescribed tramadol for additional pain coverage. Patient knows that we are still awaiting results of her recent biopsy and that she will be notified of the results as soon as the become available. No new patient information or questions relayed to RN via translator at this time.

## 2022-02-01 NOTE — Progress Notes (Unsigned)
New Patient Note  RE: [name] MRN: [id number] DOB: 1976/06/21 Date of Office Visit: 02/02/2022  Consult requested by: Teena Dunk, NP Primary care provider: Teena Dunk, NP  Chief Complaint: No chief complaint on file.  History of Present Illness: Thomas had the pleasure of seeing [name] for initial evaluation at the [facility] on 02/01/2022. She is a 46 y.o. female, who is referred here by Monica Merino I, NP for the evaluation of allergic reaction.  01/28/2022 ER visit: "[name] is a 46 y.o. female presenting to the emergency department with concern for allergic reaction.  A Spanish translator was used for the entirety of my exam.  The patient reports he had a CT scan performed with IV contrast today, had forgotten that she had allergies to contrast, and immediately after the scan she began having itching all over her body as well as swelling in her throat.  She says this occurred about 3 hours ago.  Since arrival her symptoms have completely resolved.  She said she has had similar reactions to contrast in the past.  She may possibly have certain fruit or not allergies, does not know for certain, but does not carry an EpiPen."  Assessment and Plan: [name] is a 46 y.o. female with: No problem-specific Assessment & Plan notes found for this encounter.  No follow-ups on file.  No orders of the defined types were placed in this encounter.  Lab Orders  No laboratory test(s) ordered today    Other allergy screening: Asthma: {Blank single:19197::"yes","no"} Rhino conjunctivitis: {Blank single:19197::"yes","no"} Food allergy: {Blank single:19197::"yes","no"} Medication allergy: {Blank single:19197::"yes","no"} Hymenoptera allergy: {Blank single:19197::"yes","no"} Urticaria: {Blank single:19197::"yes","no"} Eczema:{Blank single:19197::"yes","no"} History of recurrent infections suggestive of immunodeficency: {Blank  single:19197::"yes","no"}  Diagnostics: Spirometry:  Tracings reviewed. Her effort: {Blank single:19197::"Good reproducible efforts.","It was hard to get consistent efforts and there is a question as to whether this reflects a maximal maneuver.","Poor effort, data can not be interpreted."} FVC: ***L FEV1: ***L, ***% predicted FEV1/FVC ratio: ***% Interpretation: {Blank single:19197::"Spirometry consistent with mild obstructive disease","Spirometry consistent with moderate obstructive disease","Spirometry consistent with severe obstructive disease","Spirometry consistent with possible restrictive disease","Spirometry consistent with mixed obstructive and restrictive disease","Spirometry uninterpretable due to technique","Spirometry consistent with normal pattern","No overt abnormalities noted given today's efforts"}.  Please see scanned spirometry results for details.  Skin Testing: {Blank single:19197::"Select foods","Environmental allergy panel","Environmental allergy panel and select foods","Food allergy panel","None","Deferred due to recent antihistamines use"}. *** Results discussed with patient/family.   Past Medical History: Patient Active Problem List   Diagnosis Date Noted  . Language barrier 12/11/2020  . Morbid obesity ([place])   . History of breast cancer   . Genetic testing 02/13/2020  . Malignant neoplasm of upper-outer quadrant of left breast in female, estrogen receptor positive ([place]) 01/27/2020  . Impaired ability to use community resources due to language barrier 07/13/2013   Past Medical History:  Diagnosis Date  . Allergy   . Asthma   . Breast cancer in female [facility])    Left  . Breast disorder    breast cancer May 2021  . Chronic back pain   . History of breast cancer   . History of COVID-19 04/20/2021  . Hx of migraines   . Morbid obesity ([place])   . Personal history of chemotherapy   . Personal history of radiation therapy    Past Surgical History: Past  Surgical History:  Procedure Laterality Date  . BREAST LUMPECTOMY    .  BREAST LUMPECTOMY WITH RADIOACTIVE SEED AND SENTINEL LYMPH NODE BIOPSY Left 07/15/2020   Procedure: LEFT BREAST LUMPECTOMY WITH RADIOACTIVE SEED AND LEFT AXILLARY SENTINEL LYMPH NODE BIOPSY, LEFT AXILLARY NODE RADIOACTIVE SEED GUIDED EXCISION, LEFT BREAST RADIOACTIVE SEED GUIDED EXCISION IM NODE;  Surgeon: Rolm Bookbinder, MD;  Location: [place];  Service: General;  Laterality: Left;  PEC BLOCK  . CESAREAN SECTION WITH BILATERAL TUBAL LIGATION Bilateral 07/23/2015   Procedure: CESAREAN SECTION WITH BILATERAL TUBAL LIGATION;  Surgeon: Donnamae Jude, MD;  Location: [place] [facility];  Service: Obstetrics;  Laterality: Bilateral;  . PORT-A-CATH REMOVAL N/A 04/30/2021   Procedure: REMOVAL PORT-A-CATH;  Surgeon: Rolm Bookbinder, MD;  Location: [place];  Service: General;  Laterality: N/A;  . PORTACATH PLACEMENT Right 02/11/2020   Procedure: INSERTION PORT-A-CATH WITH ULTRASOUND GUIDANCE;  Surgeon: Rolm Bookbinder, MD;  Location: [place];  Service: General;  Laterality: Right;  . TUBAL LIGATION     Medication List:  Current Outpatient Medications  Medication Sig Dispense Refill  . albuterol (VENTOLIN HFA) 108 (90 Base) MCG/ACT inhaler Inhale 1-2 puffs into the lungs every 4 (four) hours as needed for wheezing or shortness of breath. 8 g 5  . amoxicillin-clavulanate (AUGMENTIN) 875-125 MG tablet Take 1 tablet by mouth 2 (two) times daily. 14 tablet 0  . atorvastatin (LIPITOR) 40 MG tablet Take 1 tablet (40 mg total) by mouth daily. 30 tablet 5  . cyclobenzaprine (FLEXERIL) 10 MG tablet Take 1 tablet (10 mg total) by mouth 3 (three) times daily as needed for muscle spasms. 30 tablet 0  . EPINEPHrine 0.3 mg/0.3 mL IJ SOAJ injection Inject 0.3 mg into the muscle as needed for anaphylaxis (Throat swelling/difficulty breathing). 1 each 2  . loratadine (CLARITIN) 10 MG tablet Take 1 tablet (10 mg total) by  mouth daily. 90 tablet 4  . predniSONE (DELTASONE) 10 MG tablet Take 1 tablet (10 mg total) by mouth daily with breakfast. 5 tablet 0  . traMADol (ULTRAM) 50 MG tablet Take 1 tablet (50 mg total) by mouth every 8 (eight) hours as needed. 30 tablet 0   No current facility-administered medications for this visit.   Allergies: Allergies  Allergen Reactions  . Omnipaque [Iohexol] Other (See Comments)    Throat itching, some SOB and some chest pain post administration   . Doxorubicin Hcl Rash    Experience chest tightness, abdominal pain, and redness around lips during and shortly after doxorubicin bolus.    Social History: Social History   Socioeconomic History  . Marital status: Legally Separated    Spouse name: Not on file  . Number of children: 3  . Years of education: Not on file  . Highest education level: 9th grade  Occupational History  . Occupation: Architect  Tobacco Use  . Smoking status: Never  . Smokeless tobacco: Never  Vaping Use  . Vaping Use: Never used  Substance and Sexual Activity  . Alcohol use: No  . Drug use: Never  . Sexual activity: Yes    Birth control/protection: Surgical  Other Topics Concern  . Not on file  Social History Narrative  . Not on file   Social Determinants of Health   Financial Resource Strain: Not on file  Food Insecurity: No Food Insecurity  . Worried About Charity fundraiser in the Last Year: Never true  . Ran Out of Food in the Last Year: Never true  Transportation Needs: No Transportation Needs  . Lack of Transportation (Medical): No  .  Lack of Transportation (Non-Medical): No  Physical Activity: Not on file  Stress: Not on file  Social Connections: Not on file   Lives in a ***. Smoking: *** Occupation: ***  Environmental HistoryFreight forwarder in the house: Estate agent in the family room: {Blank single:19197::"yes","no"} Carpet in the bedroom: {Blank  single:19197::"yes","no"} Heating: {Blank single:19197::"electric","gas","heat pump"} Cooling: {Blank single:19197::"central","window","heat pump"} Pet: {Blank single:19197::"yes ***","no"}  Family History: Family History  Problem Relation Age of Onset  . Heart disease Maternal Grandmother    Problem                               Relation Asthma                                   *** Eczema                                *** Food allergy                          *** Allergic rhino conjunctivitis     ***  Review of Systems  Constitutional:  Negative for appetite change, chills, fever and unexpected weight change.  HENT:  Negative for congestion and rhinorrhea.   Eyes:  Negative for itching.  Respiratory:  Negative for cough, chest tightness, shortness of breath and wheezing.   Cardiovascular:  Negative for chest pain.  Gastrointestinal:  Negative for abdominal pain.  Genitourinary:  Negative for difficulty urinating.  Skin:  Negative for rash.  Neurological:  Negative for headaches.   Objective: LMP 01/20/2020 (Exact Date)  There is no height or weight on file to calculate BMI. Physical Exam Vitals and nursing note reviewed.  Constitutional:      Appearance: Normal appearance. She is well-developed.  HENT:     Head: Normocephalic and atraumatic.     Right Ear: Tympanic membrane and external ear normal.     Left Ear: Tympanic membrane and external ear normal.     Nose: Nose normal.     Mouth/Throat:     Mouth: Mucous membranes are moist.     Pharynx: Oropharynx is clear.  Eyes:     Conjunctiva/sclera: Conjunctivae normal.  Cardiovascular:     Rate and Rhythm: Normal rate and regular rhythm.     Heart sounds: Normal heart sounds. No murmur heard.   No friction rub. No gallop.  Pulmonary:     Effort: Pulmonary effort is normal.     Breath sounds: Normal breath sounds. No wheezing, rhonchi or rales.  Musculoskeletal:     Cervical back: Neck supple.  Skin:    General:  Skin is warm.     Findings: No rash.  Neurological:     Mental Status: She is alert and oriented to person, place, and time.  Psychiatric:        Behavior: Behavior normal.  The plan was reviewed with the patient/family, and all questions/concerned were addressed.  It was my pleasure to see Lianah today and participate in her care. Please feel free to contact me with any questions or concerns.  Sincerely,  Rexene Alberts, DO Allergy & Immunology  [facility] office: [place] office: [phone]

## 2022-02-02 ENCOUNTER — Ambulatory Visit (INDEPENDENT_AMBULATORY_CARE_PROVIDER_SITE_OTHER): Payer: Self-pay | Admitting: Physical Therapy

## 2022-02-02 ENCOUNTER — Encounter: Payer: Self-pay | Admitting: Physical Therapy

## 2022-02-02 ENCOUNTER — Encounter: Payer: Self-pay | Admitting: Allergy

## 2022-02-02 ENCOUNTER — Ambulatory Visit (INDEPENDENT_AMBULATORY_CARE_PROVIDER_SITE_OTHER): Payer: No Typology Code available for payment source | Admitting: Allergy

## 2022-02-02 VITALS — BP 122/86 | HR 88 | Temp 95.8°F | Resp 16 | Ht 60.0 in | Wt 190.4 lb

## 2022-02-02 DIAGNOSIS — T50995D Adverse effect of other drugs, medicaments and biological substances, subsequent encounter: Secondary | ICD-10-CM

## 2022-02-02 DIAGNOSIS — M5459 Other low back pain: Secondary | ICD-10-CM

## 2022-02-02 DIAGNOSIS — M546 Pain in thoracic spine: Secondary | ICD-10-CM

## 2022-02-02 DIAGNOSIS — R0789 Other chest pain: Secondary | ICD-10-CM

## 2022-02-02 DIAGNOSIS — J3089 Other allergic rhinitis: Secondary | ICD-10-CM

## 2022-02-02 DIAGNOSIS — R293 Abnormal posture: Secondary | ICD-10-CM

## 2022-02-02 DIAGNOSIS — T781XXA Other adverse food reactions, not elsewhere classified, initial encounter: Secondary | ICD-10-CM

## 2022-02-02 DIAGNOSIS — H1013 Acute atopic conjunctivitis, bilateral: Secondary | ICD-10-CM | POA: Insufficient documentation

## 2022-02-02 DIAGNOSIS — M6281 Muscle weakness (generalized): Secondary | ICD-10-CM

## 2022-02-02 DIAGNOSIS — T781XXD Other adverse food reactions, not elsewhere classified, subsequent encounter: Secondary | ICD-10-CM

## 2022-02-02 MED ORDER — FLUTICASONE PROPIONATE 50 MCG/ACT NA SUSP: 1.0000 | Freq: Two times a day (BID) | NASAL | 5 refills | Status: DC | PRN

## 2022-02-02 MED ORDER — OLOPATADINE HCL 0.2 % OP SOLN: 1.0000 [drp] | Freq: Every day | OPHTHALMIC | 5 refills | Status: DC | PRN

## 2022-02-02 MED ORDER — MONTELUKAST SODIUM 10 MG PO TABS: 10.0000 mg | ORAL_TABLET | Freq: Every day | ORAL | 5 refills | Status: DC

## 2022-02-02 MED ORDER — OMEPRAZOLE MAGNESIUM 20 MG PO TBEC: 20.0000 mg | DELAYED_RELEASE_TABLET | Freq: Every day | ORAL | 1 refills | Status: DC

## 2022-02-02 NOTE — Therapy (Signed)
OUTPATIENT PHYSICAL THERAPY TREATMENT NOTE/Discharge PHYSICAL THERAPY DISCHARGE SUMMARY  Visits from Start of Care: 5  Current functional level related to goals / functional outcomes: See below   Remaining deficits: See below   Education / Equipment: HEP  Plan: Patient agrees to discharge.  Patient goals were not met. Patient is being discharged due to lack of progress and not helping with pain.        Patient Name: [name] MRN: [id number] DOB:September 27, 1975, 46 y.o., female Today's Date: 02/02/2022  PCP: [name] I, NP REFERRING PROVIDER: Pete Pelt, PA-C  END OF SESSION:   PT End of Session - 02/02/22 1152     Visit Number 5    Number of Visits 12    Date for PT Re-Evaluation 03/16/22    Authorization Type CAFA expires 7/28    PT Start Time 9643    PT Stop Time 1210    PT Time Calculation (min) 25 min    Activity Tolerance Patient tolerated treatment well    Behavior During Therapy [facility] for tasks assessed/performed              Past Medical History:  Diagnosis Date   Allergy    Asthma    Breast cancer in female [facility])    Left   Breast disorder    breast cancer May 2021   Chronic back pain    History of breast cancer    History of COVID-19 04/20/2021   Hx of migraines    Morbid obesity ([place])    Personal history of chemotherapy    Personal history of radiation therapy    Past Surgical History:  Procedure Laterality Date   BREAST LUMPECTOMY     BREAST LUMPECTOMY WITH RADIOACTIVE SEED AND SENTINEL LYMPH NODE BIOPSY Left 07/15/2020   Procedure: LEFT BREAST LUMPECTOMY WITH RADIOACTIVE SEED AND LEFT AXILLARY SENTINEL LYMPH NODE BIOPSY, LEFT AXILLARY NODE RADIOACTIVE SEED GUIDED EXCISION, LEFT BREAST RADIOACTIVE SEED GUIDED EXCISION IM NODE;  Surgeon: Rolm Bookbinder, MD;  Location: [place];  Service: General;  Laterality: Left;  PEC BLOCK   CESAREAN SECTION WITH BILATERAL TUBAL LIGATION Bilateral 07/23/2015   Procedure: CESAREAN  SECTION WITH BILATERAL TUBAL LIGATION;  Surgeon: Donnamae Jude, MD;  Location: [place] [facility];  Service: Obstetrics;  Laterality: Bilateral;   PORT-A-CATH REMOVAL N/A 04/30/2021   Procedure: REMOVAL PORT-A-CATH;  Surgeon: Rolm Bookbinder, MD;  Location: [place];  Service: General;  Laterality: N/A;   PORTACATH PLACEMENT Right 02/11/2020   Procedure: INSERTION PORT-A-CATH WITH ULTRASOUND GUIDANCE;  Surgeon: Rolm Bookbinder, MD;  Location: [place];  Service: General;  Laterality: Right;   TUBAL LIGATION     Patient Active Problem List   Diagnosis Date Noted   Language barrier 12/11/2020   Morbid obesity ([place])    History of breast cancer    Genetic testing 02/13/2020   Malignant neoplasm of upper-outer quadrant of left breast in female, estrogen receptor positive ([place]) 01/27/2020   Impaired ability to use community resources due to language barrier 07/13/2013    REFERRING PROVIDER: Pete Pelt, PA-C   REFERRING DIAG: [phone] (ICD-10-CM) - Chronic low back pain, unspecified back pain laterality, unspecified whether sciatica present M54.6 (ICD-10-CM) - Thoracic back pain, unspecified back pain laterality, unspecified chronicity  ONSET DATE: 2 year onset of pain  THERAPY DIAG:  Pain in thoracic spine  Muscle weakness (generalized)  Other low back pain  Abnormal posture  PERTINENT HISTORY: OHK:GOVPCH Ca in remission,chronic back pain,obesity  PRECAUTIONS: None  SUBJECTIVE:  Pt states she has not made any improvements in pain. She does not feel like it is a good idea to do any of the exercises today and they do not help her anyway. She does not think it is a good idea to try DN and feels she needs to see MD again due to pain/swelling around her breast.  Communication with interpreter service.   PAIN:  Are you having pain? Yes:  NPRS scale: 5/10 Pain location: L4-T6  superior area more intense  Pain description: tense and  hard to  sleep Aggravating factors: constant Relieving factors: nothing   OBJECTIVE:    DIAGNOSTIC FINDINGS:  12/22/2021 XR in chart, thoracic shows mild degenerative changes no acute, lumbar XR negative   PATIENT SURVEYS:  12/22/2021 FOTO 45% functional intake 02/02/22: 49% functional score   SCREENING FOR RED FLAGS: Bowel or bladder incontinence: No Cauda equina syndrome: No Compression fracture: No     COGNITION:           12/22/2021 Overall cognitive status: Within functional limits for tasks assessed                          SENSATION: 12/22/2021 [facility]   POSTURE:  12/22/2021 Slumped posture, thoracic kyphosis   PALPATION: 12/22/2021 TTP widespead in T-L spine but most severe around T6-7   LUMBAR ROM:    Active  A/PROM  12/22/2021 AROM 12/31/2021 AROM 02/02/22  Flexion 75% pain Movement to toes c increased pressure  WNL but with pain  Extension 25% pain 50% ERP lumbar 50% and with pain  Right lateral flexion 75% pain  75% and pain  Left lateral flexion 75% pain  75% and pain  Right rotation 75% pain  75% without pain  Left rotation 75% pain  75% without pain   (Blank rows = not tested)   LE ROM:  12/22/2021 WFL     LE MMT:  12/22/2021: grossly 4+, UE MMT: 02/02/22: 5/5 shoulder flexion, and 4+ for bilat shoulder ABD, ER, IR    (Blank rows = not tested)   LUMBAR SPECIAL TESTS:   12/22/2021 Slump test negative bilat, SLR test negative on Rt and + on left   FUNCTIONAL TESTS:     GAIT: 12/22/2021: Comments: WFL       TODAY'S TREATMENT  02/02/2022:  Therex:   UBE L5 UE retro X 5 min (stopped due to pain)   Rest of time was spent updating measurments (see above), checking progress, final FOTO functional survery and discussion in plan of care to DC from PT due to lack of progress and referral back to MD    01/11/2022:  Therex:   UBE L5 UE retro X 5 min   Standing rows and extensions green 2 x 15 each bilat   Standing bilat ER red 2 x 10   Standing horizontal abd with red  2 x 10   Supine LTR stretch 10 sec x 5 bilateral   Door stretch 90 degrees x 4 holding 20 seconds   Standing red ball rolling up wall into max shoulder flexion 5 sec X 10   Supine bridge 5 sec hold 2X10   Supine marching alternating x 10 using ball for isometric hold 3 sec   Sidelying Thoracic rotations 5 sec hold X 10 bilat              Cat/Camel: x 10 holding 10 seconds  Manual therapy: Percussive device  to thoracolumbar paraspinals in prone x 8 minutes    01/03/2022:  Therex:   UBE L5 UE/LE X 5 min   Standing rows and extensions green 2X10 each bilat   Standing bilat ER red 2X10   Standing H abd with red 2X10   Supine LTR stretch 10 sec x 5 bilateral   Standing "L" thoracic stretch 10 sec  x 10   Standing red ball rolling up wall into max shoulder flexion 5 sec X 10   Supine bridge 5 sec hold 2X10   Supine marching alternating x 10 using ball for isometric hold 3 sec   Sidelying Thoracic rotations 5 sec hold x 5 bilateral  Manual therapy: Percussive device to thoracolumbar paraspinals in prone   12/31/2021:  Therex:   Supine LTR stretch 15 sec x 3 bilateral   Supine bridge 2-3 sec hold x 15   Supine green band clam shells x 20 bilateral    Supine marching alternating 2 x 10 c transverse ab contraction hold   Seated pball press down for ab activation 5 sec hold x 10   Seated pball hip adductor squeeze 5 sec hold x 10   Estim:  IFC lower thoracic/upper lumbar region to tolerance 10 mins c moist heat to area in sitting.        PATIENT EDUCATION:  12/31/2021 Education details: HEP progression  Person educated: Patient Education method: Explanation, Demonstration, Verbal cues, and Handouts Education comprehension: verbalized understanding     HOME EXERCISE PROGRAM: 12/31/2021: Access Code: [email or web address] URL: [email or web address] Date: 12/31/2021 Prepared by: Scot Jun  Exercises - Seated Upper Thoracic Stretch  - 2 x daily - 6 x weekly - 1 sets - 10  reps - 5 hold - Standing Thoracic Spine Stretch  - 2 x daily - 6 x weekly - 1 sets - 10 reps - 5 hold - Sidelying Thoracic Rotation with Open Book  - 2 x daily - 6 x weekly - 1 sets - 10 reps - 5 sec hold - Standing Scapular Retraction with External Rotation  - 2 x daily - 6 x weekly - 3 sets - 10 reps - Supine March with Posterior Pelvic Tilt  - 2 x daily - 7 x weekly - 3 sets - 10 reps - Supine Lower Trunk Rotation  - 2-3 x daily - 7 x weekly - 1 sets - 3-5 reps - 15 hold   ASSESSMENT:   CLINICAL IMPRESSION:  Pt has not made significant progress and has not had any pain relief from PT. She feels she needs to return to see MD and does not feel it is a good idea to perform any other treatments or interventions. I agree with this plan and we will discharge her from PT and refer her back to MD.    OBJECTIVE IMPAIRMENTS: decreased activity tolerance, difficulty walking, decreased balance, decreased endurance, decreased mobility, decreased ROM, decreased strength, impaired flexibility, impaired UE/LE use, postural dysfunction, and pain.   ACTIVITY LIMITATIONS: bending, lifting, carry, locomotion, cleaning, community activity, driving, and or occupation   PERSONAL FACTORS :breast Ca in remision,chronic back pain,obesity, are also affecting patient's functional outcome.   REHAB POTENTIAL: Fair     CLINICAL DECISION MAKING: Evolving/moderate complexity   EVALUATION COMPLEXITY: Moderate       GOALS: Short term PT Goals Target date: 01/19/2022 Pt will be I and compliant with HEP. Baseline:  Goal status: MET 01/11/2022 Pt will decrease pain by 25% overall Baseline: Goal status: not met  Long term PT goals Target date: 03/16/2022 Pt will improve ROM to [facility] to improve functional mobility Baseline: Goal status: not met Pt will improve UE strength 4+ grossly MMT to improve functional strength Baseline: Goal status: MET Pt will improve FOTO to at least 56% functional to show improved  function Baseline: Goal status: not met now 49% Pt will reduce pain by overall 50% overall with usual activity, ADL's Baseline: Goal status: Not met  PLAN: PT FREQUENCY: 1-2 times per week    PT DURATION: 6-8 weeks   PLANNED INTERVENTIONS (unless contraindicated): aquatic PT, Canalith repositioning, cryotherapy, Electrical stimulation, Iontophoresis with 4 mg/ml dexamethasome, Moist heat, traction, Ultrasound, gait training, Therapeutic exercise, balance training, neuromuscular re-education, patient/family education, prosthetic training, manual techniques, passive ROM, dry needling, taping, vasopnuematic device, vestibular, spinal manipulations, joint manipulations   PLAN FOR NEXT SESSION:  DC with referral back to MD    Elsie Ra, PT, DPT 02/02/22 12:21 PM

## 2022-02-02 NOTE — Patient Instructions (Addendum)
Today's skin testing showed: Positive to grass, weed, tree, dust mites. Negative to select foods.  Results given.  Environmental allergies Start environmental control measures as below. Start Singulair (montelukast) '10mg'$  daily at night. Cautioned that in some children/adults can experience behavioral changes including hyperactivity, agitation, depression, sleep disturbances and suicidal ideations. These side effects are rare, but if you notice them you should notify me and discontinue Singulair (montelukast). Use Flonase (fluticasone) nasal spray 1 spray per nostril twice a day as needed for nasal congestion.  Use olopatadine eye drops 0.2% once a day as needed for itchy/watery eyes.  Use over the counter antihistamines such as Zyrtec (cetirizine), Claritin (loratadine), Allegra (fexofenadine), or Xyzal (levocetirizine) daily as needed. May take twice a day during allergy flares. May switch antihistamines every few months. Consider allergy injections for long term control if above medications do not help the symptoms - handout given.   Food: Discussed that her food triggered oral and throat symptoms are likely caused by oral food allergy syndrome (OFAS). This is caused by cross reactivity of pollen with fresh fruits and vegetables, and nuts. Symptoms are usually localized in the form of itching and burning in mouth and throat. Very rarely it can progress to more severe symptoms. Eating foods in cooked or processed forms usually minimizes symptoms. I recommended avoidance of eating the problem foods, especially during the peak season(s). Sometimes, OFAS can induce severe throat swelling or even a systemic reaction; with such instance, I advised them to report to a local ER. A list of common pollens and food cross-reactivities was provided to the patient.   Contrast Recommend pre-medication prior to future contrast use.  There is no bloodwork or skin testing for contrast.   Breathing/chest  tightness May use albuterol rescue inhaler 2 puffs every 4 to 6 hours as needed for shortness of breath, chest tightness, coughing, and wheezing. Monitor frequency of use.   Possibly heartburn: See handout for lifestyle and dietary modifications. Start omeprazole '20mg'$  daily in the morning for 1 month then stop.  If chest tightness not improved then recommend to see your PCP for this.   Follow up in 3 months or sooner if needed.    Reducing Pollen Exposure Pollen seasons: trees (spring), grass (summer) and ragweed/weeds (fall). Keep windows closed in your home and car to lower pollen exposure.  Install air conditioning in the bedroom and throughout the house if possible.  Avoid going out in dry windy days - especially early morning. Pollen counts are highest between 5 - 10 AM and on dry, hot and windy days.  Save outside activities for late afternoon or after a heavy rain, when pollen levels are lower.  Avoid mowing of grass if you have grass pollen allergy. Be aware that pollen can also be transported indoors on people and pets.  Dry your clothes in an automatic dryer rather than hanging them outside where they might collect pollen.  Rinse hair and eyes before bedtime. Control of House Dust Mite Allergen Dust mite allergens are a common trigger of allergy and asthma symptoms. While they can be found throughout the house, these microscopic creatures thrive in warm, humid environments such as bedding, upholstered furniture and carpeting. Because so much time is spent in the bedroom, it is essential to reduce mite levels there.  Encase pillows, mattresses, and box springs in special allergen-proof fabric covers or airtight, zippered plastic covers.  Bedding should be washed weekly in hot water (130 F) and dried in a hot dryer. Allergen-proof  covers are available for comforters and pillows that can't be regularly washed.  Wash the allergy-proof covers every few months. Minimize clutter in the  bedroom. Keep pets out of the bedroom.  Keep humidity less than 50% by using a dehumidifier or air conditioning. You can buy a humidity measuring device called a hygrometer to monitor this.  If possible, replace carpets with hardwood, linoleum, or washable area rugs. If that's not possible, vacuum frequently with a vacuum that has a HEPA filter. Remove all upholstered furniture and non-washable window drapes from the bedroom. Remove all non-washable stuffed toys from the bedroom.  Wash stuffed toys weekly.

## 2022-02-03 ENCOUNTER — Encounter: Payer: Self-pay | Admitting: Allergy

## 2022-02-03 DIAGNOSIS — T781XXD Other adverse food reactions, not elsewhere classified, subsequent encounter: Secondary | ICD-10-CM | POA: Insufficient documentation

## 2022-02-03 NOTE — Assessment & Plan Note (Signed)
.   See assessment and plan as above. 

## 2022-02-03 NOTE — Assessment & Plan Note (Addendum)
Patient concerned for asthma as she noted chest tightness at times. Uses albuterol prn with some benefit.  Today's spirometry was normal with 4% improvement in FEV1 post bronchodilator treatment. Clinically feeling improved.  . Discussed with patient that I'm not convinced that her chest tightness is due to her lungs. Some of her symptoms concerning for heartburn.  Start omeprazole '20mg'$  daily in the morning for 1 month then stop.  Gave handout on reflux in Spanish.  . May use albuterol rescue inhaler 2 puffs every 4 to 6 hours as needed for shortness of breath, chest tightness, coughing, and wheezing. Monitor frequency of use.   If chest tightness not improved then recommend to see PCP to rule out cardiac issues.

## 2022-02-03 NOTE — Assessment & Plan Note (Addendum)
Reaction to IV contrast in the form of feeling warm, throat tightness and palpitations. Symptoms resolved without any meds. She had reaction to contrast a few months prior with throat itching and coughing but forgot to mention during the second contrast administration. Medical history significant for breast cancer. . Discussed with patient that there is no skin testing or bloodwork to check for contrast allergy.  . Her symptoms resolved without any medications.  . Recommend pre-medication prior to future contrast use - if she has symptoms despite premedication then let us know.

## 2022-02-03 NOTE — Assessment & Plan Note (Signed)
Rhinitis symptoms around dust. No prior testing. Would like to get environmental testing done today.  Today's skin prick testing showed: Positive to grass, weed, tree, dust mites.  Start environmental control measures as below.  Start Singulair (montelukast) '10mg'$  daily at night.  Cautioned that in some children/adults can experience behavioral changes including hyperactivity, agitation, depression, sleep disturbances and suicidal ideations. These side effects are rare, but if you notice them you should notify me and discontinue Singulair (montelukast).  Use Flonase (fluticasone) nasal spray 1 spray per nostril twice a day as needed for nasal congestion.   Use olopatadine eye drops 0.2% once a day as needed for itchy/watery eyes.  Use over the counter antihistamines such as Zyrtec (cetirizine), Claritin (loratadine), Allegra (fexofenadine), or Xyzal (levocetirizine) daily as needed. May take twice a day during allergy flares. May switch antihistamines every few months.  Consider allergy injections for long term control if above medications do not help the symptoms - handout given.

## 2022-02-03 NOTE — Assessment & Plan Note (Signed)
Noted perioral discomfort after eating apples, plums, peaches and almonds. Has not tried processed/cooked forms. Raw shrimp causes contact rash but can ingest cooked shrimp with no issues.  Today's skin testing was negative to select foods.   Avoid foods that are bothersome and wear gloves when handling raw shrimp.  . 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 This is caused by cross reactivity of pollen with fresh fruits and vegetables, and nuts. Symptoms are usually localized in the form of itching and burning in mouth and throat. Very rarely it can progress to more severe symptoms. Eating foods in cooked or processed forms usually minimizes symptoms. I recommended avoidance of eating the problem foods, especially during the peak season(s). Sometimes, OFAS can induce severe throat swelling or even a systemic reaction; with such instance, I advised them to report to a local ER. A list of common pollens and food cross-reactivities was provided to the patient.  . The major allergen in shellfish allergy is tropomyosin, a pan-allergen that is also found in house dust mites and cockroaches which can cause cross reactivity as well.

## 2022-02-09 ENCOUNTER — Other Ambulatory Visit: Payer: Self-pay

## 2022-02-09 ENCOUNTER — Encounter: Payer: Self-pay | Admitting: *Deleted

## 2022-02-09 ENCOUNTER — Encounter: Payer: Self-pay | Admitting: Hematology and Oncology

## 2022-02-09 ENCOUNTER — Inpatient Hospital Stay (HOSPITAL_BASED_OUTPATIENT_CLINIC_OR_DEPARTMENT_OTHER): Payer: No Typology Code available for payment source | Admitting: Hematology and Oncology

## 2022-02-09 DIAGNOSIS — C50412 Malignant neoplasm of upper-outer quadrant of left female breast: Secondary | ICD-10-CM

## 2022-02-09 DIAGNOSIS — Z170 Estrogen receptor positive status [ER+]: Secondary | ICD-10-CM

## 2022-02-09 NOTE — Assessment & Plan Note (Addendum)
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  She was most recently seen for ongoing breast redness, given a course of antibiotics but had persistent redness hence we proceeded with a punch biopsy.  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  The tissue sample was thought to be scant. Pathology was sent to [facility] for second opinion as well. We have recommended neoadjuvant chemotherapy with TC every 21 days for 4 cycles followed by adjuvant xeloda. She says she couldn't tolerate [place] well at all, had intractable cough, sob and skin rash. She however doesn't remember much about xeloda adverse effects She is willing to try it again. She will have to proceed with mastectomy after neoadjuvant chemotherapy. Anticipate chemo coming Monday. FU with me in 3 weeks approximately.

## 2022-02-09 NOTE — Progress Notes (Signed)
DISCONTINUE ON PATHWAY REGIMEN - Breast     A cycle is every 14 days (cycles 1-4):     Doxorubicin      Cyclophosphamide      [id number]-xxxx    A cycle is every 21 days (cycles 5-8):     Paclitaxel      Carboplatin   **Always confirm dose/schedule in your pharmacy ordering system**  REASON: Other Reason PRIOR TREATMENT: [id number]: Dose-Dense AC [Doxorubicin + Cyclophosphamide q14 Days x 4 Cycles], Followed by Paclitaxel 80 mg/m2 Weekly + Carboplatin AUC=6 q21 Days x 12 Weeks TREATMENT RESPONSE: Unable to Evaluate  START ON PATHWAY REGIMEN - Breast     A cycle is every 21 days:     Docetaxel      Cyclophosphamide   **Always confirm dose/schedule in your pharmacy ordering system**  Patient Characteristics: Postoperative without Neoadjuvant Therapy (Pathologic Staging), Invasive Disease, Adjuvant Therapy, HER2 Negative/Unknown/Equivocal, ER Negative/Unknown, Node Negative, pT1a-c, N46mi or pT1c or Higher, pN0 Therapeutic Status: Postoperative without Neoadjuvant Therapy (Pathologic Staging) AJCC Grade: G3 AJCC N Category: pN0(i+) AJCC M Category: cM0 ER Status: Negative (-) AJCC 8 Stage Grouping: IIIC HER2 Status: Negative (-) Oncotype Dx Recurrence Score: Not Appropriate AJCC T Category: pT4 PR Status: Negative (-) Adjuvant Therapy Status: No Adjuvant Therapy Received Yet or Changing Initial Adjuvant Regimen due to Tolerance Intent of Therapy: Curative Intent, Discussed with Patient

## 2022-02-09 NOTE — Progress Notes (Signed)
[facility] Follow up:    Monica Merino I, NP [place] [address] 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             (a) chest CT scan and bone scan 02/07/2020 showed no evidence of metastatic disease   (1) neoadjuvant chemotherapy consisting of doxorubicin and cyclophosphamide in dose dense fashion x4 started 02/12/2020, completed 03/25/2020, followed by paclitaxel and carboplatin weekly x12 started 04/08/2020 and completed on 05/28/2020             (a) echo 02/07/2020 shows an ejection fraction in the 55-60% range             (b) Epirubicin substituted for Doxorubicin starting with cycle 2 of AC secondary to allergic rash from Doxorubicin   (2) status post left lumpectomy and sentinel lymph node sampling 07/15/2020 for a ypT1b ypN0 residual invasive ductal carcinoma, grade 3, with negative margins.             (a) a total of 4 left axillary lymph nodes were removed             (b) repeat prognostic panel on the final pathology again triple negative, with an MIB-1 of 50%.   (3) adjuvant radiation completed 10/09/2020             (a) sensitizing capecitabine attempted but not tolerated by the patient   (4) genetics testing 02/07/2020 through the [facility] Common Hereditary Cancers Panel found no deleterious mutations in  APC, ATM, AXIN2, BARD1, BMPR1A, BRCA1, BRCA2, BRIP1, CDH1, CDKN2A (p14ARF), CDKN2A (p16INK4a), CKD4,  CHEK2, CTNNA1, DICER1, EPCAM (Deletion/duplication testing only), GREM1 (promoter region deletion/duplication testing only), KIT, MEN1, MLH1, MSH2, MSH3, MSH6, MUTYH, NBN, NF1, NHTL1, PALB2, PDGFRA, PMS2, POLD1, POLE, PTEN, RAD50, RAD51C, RAD51D, RNF43, SDHB, SDHC, SDHD, SMAD4, SMARCA4. STK11, TP53, TSC1, TSC2, and VHL.  The following genes were evaluated for sequence changes only: SDHA and HOXB13 c.251G>A variant only.             (a) VUS in MSH6 called c.2744C>G identified.    (5)  adjuvant pembrolizumab started 11/23/2020, to continue for 1 year             (a) discontinued after 01/04/2021 dose with multiple side effects requiring steroids for resolution  CURRENT THERAPY: Observation  INTERVAL HISTORY: [name] 46 y.o. female currently on observation who is here for a follow up.  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 CT imaging as well as MRI brain imaging without any evidence of metastatic breast cancer. Pathology sent to [facility] for second opinion.  She is here for consideration of neoadjuvant chemotherapy again.  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/2022   Adverse effect of other drugs, medicaments and biological substances, subsequent encounter 02/02/2022   Other adverse food reactions, not elsewhere classified, subsequent encounter 02/02/2022   Chest tightness 02/02/2022  Other allergic rhinitis 02/02/2022   Allergic conjunctivitis of both eyes 02/02/2022   Language barrier 12/11/2020   Morbid obesity ([place])    History of breast cancer    Genetic testing 02/13/2020   Malignant neoplasm of upper-outer quadrant of left breast in female, estrogen receptor positive ([place]) 01/27/2020   Impaired ability to use community resources due to language barrier 07/13/2013    is allergic to omnipaque [iohexol] and doxorubicin hcl.  MEDICAL HISTORY: Past  Medical History:  Diagnosis Date   Allergy    Asthma    Breast cancer in female [facility])    Left   Breast disorder    breast cancer May 2021   Chronic back pain    History of breast cancer    History of COVID-19 04/20/2021   Hx of migraines    Morbid obesity ([place])    Personal history of chemotherapy    Personal history of radiation therapy     SURGICAL HISTORY: Past Surgical History:  Procedure Laterality Date   BREAST LUMPECTOMY     BREAST LUMPECTOMY WITH RADIOACTIVE SEED AND SENTINEL LYMPH NODE BIOPSY Left 07/15/2020   Procedure: LEFT BREAST LUMPECTOMY WITH RADIOACTIVE SEED AND LEFT AXILLARY SENTINEL LYMPH NODE BIOPSY, LEFT AXILLARY NODE RADIOACTIVE SEED GUIDED EXCISION, LEFT BREAST RADIOACTIVE SEED GUIDED EXCISION IM NODE;  Surgeon: Rolm Bookbinder, MD;  Location: [facility];  Service: General;  Laterality: Left;  PEC BLOCK   CESAREAN SECTION WITH BILATERAL TUBAL LIGATION Bilateral 07/23/2015   Procedure: CESAREAN SECTION WITH BILATERAL TUBAL LIGATION;  Surgeon: Donnamae Jude, MD;  Location: [facility];  Service: Obstetrics;  Laterality: Bilateral;   PORT-A-CATH REMOVAL N/A 04/30/2021   Procedure: REMOVAL PORT-A-CATH;  Surgeon: Rolm Bookbinder, MD;  Location: [facility];  Service: General;  Laterality: N/A;   PORTACATH PLACEMENT Right 02/11/2020   Procedure: INSERTION PORT-A-CATH WITH ULTRASOUND GUIDANCE;  Surgeon: Rolm Bookbinder, MD;  Location: [facility];  Service: General;  Laterality: Right;   TUBAL LIGATION      SOCIAL HISTORY: Social History   Socioeconomic History   Marital status: Legally Separated    Spouse name: Not on file   Number of children: 3   Years of education: Not on file   Highest education level: 9th grade  Occupational History   Occupation: Architect  Tobacco Use   Smoking status: Never   Smokeless tobacco: Never  Vaping Use   Vaping Use: Never used  Substance and Sexual Activity   Alcohol use: No   Drug use:  Never   Sexual activity: Yes    Birth control/protection: Surgical  Other Topics Concern   Not on file  Social History Narrative   Not on file   Social Determinants of Health   Financial Resource Strain: Not on file  Food Insecurity: No Food Insecurity   Worried About Running Out of Food in the Last Year: Never true   Ran Out of Food in the Last Year: Never true  Transportation Needs: No Transportation Needs   Lack of Transportation (Medical): No   Lack of Transportation (Non-Medical): No  Physical Activity: Not on file  Stress: Not on file  Social Connections: Not on file  Intimate Partner Violence: Not on file    FAMILY HISTORY: Family History  Problem Relation Age of Onset   Heart disease Maternal Grandmother     Review of Systems  Constitutional:  Negative for appetite change, chills, fatigue, fever and unexpected weight change.  HENT:   Negative for hearing  loss, lump/mass and trouble swallowing.   Eyes:  Negative for eye problems and icterus.  Respiratory:  Negative for chest tightness, cough and shortness of breath.   Cardiovascular:  Negative for chest pain, leg swelling and palpitations.  Gastrointestinal:  Negative for abdominal distention, abdominal pain, constipation, diarrhea, nausea and vomiting.  Endocrine: Negative for hot flashes.  Genitourinary:  Negative for difficulty urinating.   Musculoskeletal:  Positive for back pain (She complains of pain in the left breast today). Negative for arthralgias and gait problem.  Skin:  Positive for rash (redness of left breast and back.). Negative for itching.  Neurological:  Negative for dizziness, extremity weakness, gait problem, headaches, light-headedness, numbness, seizures and speech difficulty.  Hematological:  Negative for adenopathy. Does not bruise/bleed easily.  Psychiatric/Behavioral:  Negative for depression. The patient is not nervous/anxious.      PHYSICAL EXAMINATION  ECOG PERFORMANCE STATUS: 1 -  Symptomatic but completely ambulatory  Vitals:   02/09/22 0851  BP: 116/76  Pulse: 71  Resp: 18  Temp: 97.9 F (36.6 C)  SpO2: 99%    Physical Exam Constitutional:      General: She is not in acute distress.    Appearance: Normal appearance. She is not toxic-appearing.  HENT:     Head: Normocephalic and atraumatic.  Eyes:     General: No scleral icterus. Cardiovascular:     Rate and Rhythm: Normal rate and regular rhythm.     Pulses: Normal pulses.     Heart sounds: Normal heart sounds.  Pulmonary:     Effort: Pulmonary effort is normal.     Breath sounds: Normal breath sounds.  Chest:       Comments: Erythema of the left breast appears to be the same since last visit. Erythema in the back appears improved. Abdominal:     General: Abdomen is flat. Bowel sounds are normal. There is no distension.     Palpations: Abdomen is soft.     Tenderness: There is no abdominal tenderness.  Musculoskeletal:        General: No swelling.     Cervical back: Neck supple.  Lymphadenopathy:     Cervical: No cervical adenopathy.     Upper Body:     Left upper body: No pectoral adenopathy.  Skin:    General: Skin is warm and dry.     Findings: No rash.  Neurological:     General: No focal deficit present.     Mental Status: She is alert.  Psychiatric:        Mood and Affect: Mood normal.        Behavior: Behavior normal.    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  She was most recently seen for ongoing breast redness, given a course of antibiotics but had persistent redness hence we proceeded with a punch biopsy.  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  The tissue sample was thought to be scant. Pathology was sent to [facility] for second opinion as well. We have recommended neoadjuvant chemotherapy with TC every 21 days for 4 cycles followed by adjuvant xeloda. She says she couldn't tolerate [place] well at all, had intractable  cough, sob and skin rash. She however doesn't remember much about xeloda adverse effects She is willing to try it again. She will have to proceed with mastectomy after neoadjuvant chemotherapy. Anticipate chemo coming Monday. FU with me in 3 weeks approximately.   Total encounter time:40 minutes*in face-to-face visit time, chart review, lab review, care coordination, order entry, and documentation of the encounter time.  *Total Encounter Time as defined by the Centers for Medicare and Medicaid Services includes, in addition to the face-to-face time of a patient visit (documented in the note above) non-face-to-face time: obtaining and reviewing outside history, ordering and reviewing medications, tests or procedures, care coordination (communications with other health care professionals or caregivers) and documentation in the medical record.

## 2022-02-10 ENCOUNTER — Other Ambulatory Visit: Payer: Self-pay | Admitting: Radiology

## 2022-02-10 ENCOUNTER — Encounter: Payer: Self-pay | Admitting: *Deleted

## 2022-02-10 ENCOUNTER — Encounter: Payer: Self-pay | Admitting: Hematology and Oncology

## 2022-02-10 ENCOUNTER — Other Ambulatory Visit: Payer: Self-pay | Admitting: Student

## 2022-02-11 ENCOUNTER — Telehealth: Payer: Self-pay | Admitting: *Deleted

## 2022-02-11 ENCOUNTER — Other Ambulatory Visit: Payer: Self-pay | Admitting: *Deleted

## 2022-02-11 ENCOUNTER — Ambulatory Visit (HOSPITAL_COMMUNITY)
Admission: RE | Admit: 2022-02-11 | Discharge: 2022-02-11 | Disposition: A | Discharge status: Home / Self Care | Payer: No Typology Code available for payment source | Source: Ambulatory Visit | Attending: Hematology and Oncology | Admitting: Hematology and Oncology

## 2022-02-11 ENCOUNTER — Encounter (HOSPITAL_COMMUNITY): Payer: Self-pay

## 2022-02-11 ENCOUNTER — Other Ambulatory Visit: Payer: Self-pay

## 2022-02-11 DIAGNOSIS — C50919 Malignant neoplasm of unspecified site of unspecified female breast: Secondary | ICD-10-CM | POA: Insufficient documentation

## 2022-02-11 DIAGNOSIS — C50412 Malignant neoplasm of upper-outer quadrant of left female breast: Secondary | ICD-10-CM

## 2022-02-11 HISTORY — PX: IR IMAGING GUIDED PORT INSERTION: IMG5740

## 2022-02-11 MED ORDER — LIDOCAINE HCL 1 % IJ SOLN
INTRAMUSCULAR | Status: AC
  Filled 2022-02-11: qty 20

## 2022-02-11 MED ORDER — MIDAZOLAM HCL 2 MG/2ML IJ SOLN
INTRAMUSCULAR | Status: AC
  Filled 2022-02-11: qty 4

## 2022-02-11 MED ORDER — LIDOCAINE-EPINEPHRINE 1 %-1:100000 IJ SOLN
INTRAMUSCULAR | Status: AC
  Filled 2022-02-11: qty 1

## 2022-02-11 MED ORDER — LIDOCAINE-EPINEPHRINE 1 %-1:100000 IJ SOLN
INTRAMUSCULAR | Status: AC | PRN
  Administered 2022-02-11: 20 mL

## 2022-02-11 MED ORDER — MIDAZOLAM HCL 2 MG/2ML IJ SOLN
INTRAMUSCULAR | Status: AC | PRN
  Administered 2022-02-11 (×4): 1 mg via INTRAVENOUS

## 2022-02-11 MED ORDER — FENTANYL CITRATE (PF) 100 MCG/2ML IJ SOLN
INTRAMUSCULAR | Status: AC
  Filled 2022-02-11: qty 2

## 2022-02-11 MED ORDER — HEPARIN SOD (PORK) LOCK FLUSH 100 UNIT/ML IV SOLN
INTRAVENOUS | Status: AC | PRN
  Administered 2022-02-11: 500 [IU] via INTRAVENOUS

## 2022-02-11 MED ORDER — SODIUM CHLORIDE 0.9 % IV SOLN: INTRAVENOUS | Status: DC

## 2022-02-11 MED ORDER — HEPARIN SOD (PORK) LOCK FLUSH 100 UNIT/ML IV SOLN
INTRAVENOUS | Status: AC
  Filled 2022-02-11: qty 5

## 2022-02-11 MED ORDER — FENTANYL CITRATE (PF) 100 MCG/2ML IJ SOLN
INTRAMUSCULAR | Status: AC | PRN
  Administered 2022-02-11 (×2): 50 ug via INTRAVENOUS

## 2022-02-11 NOTE — H&P (Signed)
Chief Complaint: Patient was seen in consultation today for port a cath placement  Referring Physician(s): Iruku,Praveena  Supervising Physician: Markus Daft  Patient Status: [facility] - Out-pt  History of Present Illness: [name] is a 46 y.o. 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 She has plans for upcoming chemotherapy and is in need of Port-A-Cath placement at the request of Dr. Chryl Heck. She has had prior rt chest port by [facility] in past, since removed.   Patient presents to [facility] today in her usual state of health.  She has been NPO.  She does not take blood thinners.  Visit conducted with assistance from translation services.   Past Medical History:  Diagnosis Date   Allergy    Asthma    Breast cancer in female [facility])    Left   Breast disorder    breast cancer May 2021   Chronic back pain    History of breast cancer    History of COVID-19 04/20/2021   Hx of migraines    Morbid obesity ([place])    Personal history of chemotherapy    Personal history of radiation therapy     Past Surgical History:  Procedure Laterality Date   BREAST LUMPECTOMY     BREAST LUMPECTOMY WITH RADIOACTIVE SEED AND SENTINEL LYMPH NODE BIOPSY Left 07/15/2020   Procedure: LEFT BREAST LUMPECTOMY WITH RADIOACTIVE SEED AND LEFT AXILLARY SENTINEL LYMPH NODE BIOPSY, LEFT AXILLARY NODE RADIOACTIVE SEED GUIDED EXCISION, LEFT BREAST RADIOACTIVE SEED GUIDED EXCISION IM NODE;  Surgeon: Rolm Bookbinder, MD;  Location: [facility];  Service: General;  Laterality: Left;  PEC BLOCK   CESAREAN SECTION WITH BILATERAL TUBAL LIGATION Bilateral 07/23/2015   Procedure: CESAREAN SECTION WITH BILATERAL TUBAL LIGATION;  Surgeon: Donnamae Jude, MD;  Location: [facility];  Service: Obstetrics;  Laterality: Bilateral;   PORT-A-CATH REMOVAL N/A 04/30/2021   Procedure: REMOVAL PORT-A-CATH;   Surgeon: Rolm Bookbinder, MD;  Location: [facility];  Service: General;  Laterality: N/A;   PORTACATH PLACEMENT Right 02/11/2020   Procedure: INSERTION PORT-A-CATH WITH ULTRASOUND GUIDANCE;  Surgeon: Rolm Bookbinder, MD;  Location: [facility];  Service: General;  Laterality: Right;   TUBAL LIGATION      Allergies: Omnipaque [iohexol], Doxorubicin hcl, Keytruda [pembrolizumab], and Other  Medications: Prior to Admission medications   Medication Sig Start Date End Date Taking? Authorizing Provider  albuterol (VENTOLIN HFA) 108 (90 Base) MCG/ACT inhaler Inhale 1-2 puffs into the lungs every 4 (four) hours as needed for wheezing or shortness of breath. 12/16/20  Yes Tanner, Lyndon Code., PA-C  montelukast (SINGULAIR) 10 MG tablet Take 1 tablet (10 mg total) by mouth at bedtime. 02/02/22  Yes Garnet Sierras, DO  Olopatadine HCl 0.2 % SOLN Apply 1 drop to eye daily as needed (itchy/watery eyes). 02/02/22  Yes Garnet Sierras, DO  atorvastatin (LIPITOR) 40 MG tablet Take 1 tablet (40 mg total) by mouth daily. 10/04/21 04/02/22  [name] I, NP  cyclobenzaprine (FLEXERIL) 10 MG tablet Take 1 tablet (10 mg total) by mouth 3 (three) times daily as needed for muscle spasms. Patient not taking: Reported on 02/02/2022 11/29/21   [name] I, NP  EPINEPHrine 0.3 mg/0.3 mL IJ SOAJ injection Inject 0.3 mg into the muscle as needed for anaphylaxis (Throat swelling/difficulty breathing). 01/28/22   Wyvonnia Dusky, MD  fluticasone (FLONASE) 50 MCG/ACT nasal spray Place 1  spray into both nostrils 2 (two) times daily as needed (nasal congestion). 02/02/22   Garnet Sierras, DO  omeprazole (PRILOSEC OTC) 20 MG tablet Take 1 tablet (20 mg total) by mouth daily. 02/02/22   Garnet Sierras, DO  traMADol (ULTRAM) 50 MG tablet Take 1 tablet (50 mg total) by mouth every 8 (eight) hours as needed. Patient not taking: Reported on 02/02/2022 02/01/22   Benay Pike, MD     Family History  Problem  Relation Age of Onset   Heart disease Maternal Grandmother     Social History   Socioeconomic History   Marital status: Legally Separated    Spouse name: Not on file   Number of children: 3   Years of education: Not on file   Highest education level: 9th grade  Occupational History   Occupation: Architect  Tobacco Use   Smoking status: Never   Smokeless tobacco: Never  Vaping Use   Vaping Use: Never used  Substance and Sexual Activity   Alcohol use: No   Drug use: Never   Sexual activity: Yes    Birth control/protection: Surgical  Other Topics Concern   Not on file  Social History Narrative   Not on file   Social Determinants of Health   Financial Resource Strain: Not on file  Food Insecurity: No Food Insecurity   Worried About Running Out of Food in the Last Year: Never true   [place] in the Last Year: Never true  Transportation Needs: No Transportation Needs   Lack of Transportation (Medical): No   Lack of Transportation (Non-Medical): No  Physical Activity: Not on file  Stress: Not on file  Social Connections: Not on file     Review of Systems: A 12 point ROS discussed and pertinent positives are indicated in the HPI above.  All other systems are negative.  Review of Systems  Constitutional:  Negative for fatigue and fever.  Respiratory:  Negative for cough and shortness of breath.   Cardiovascular:  Negative for chest pain.  Gastrointestinal:  Negative for abdominal pain.  Musculoskeletal:  Negative for back pain.  Psychiatric/Behavioral:  Negative for behavioral problems and confusion.   Does have occ HA'S and some tenderness/erythema of left breast Vital Signs:pend LMP 01/20/2020 (Exact Date)    Physical Exam Vitals and nursing note reviewed.  Constitutional:      General: She is not in acute distress.    Appearance: Normal appearance. She is not ill-appearing.  HENT:     Mouth/Throat:     Mouth: Mucous membranes are moist.      Pharynx: Oropharynx is clear.  Cardiovascular:     Rate and Rhythm: Normal rate and regular rhythm.  Pulmonary:     Effort: Pulmonary effort is normal.  Skin:    General: Skin is warm and dry.  Neurological:     General: No focal deficit present.     Mental Status: She is alert and oriented to person, place, and time. Mental status is at baseline.  Psychiatric:        Mood and Affect: Mood normal.        Behavior: Behavior normal.        Thought Content: Thought content normal.        Judgment: Judgment normal.   Some erythema/tenderness noted left breast      Imaging: MR Brain W Wo Contrast  Result Date: 01/28/2022 CLINICAL DATA:  Inflammatory breast cancer EXAM: MRI HEAD WITHOUT AND WITH CONTRAST  TECHNIQUE: Multiplanar, multiecho pulse sequences of the brain and surrounding structures were obtained without and with intravenous contrast. CONTRAST:  8.23m GADAVIST GADOBUTROL 1 MMOL/ML IV SOLN COMPARISON:  No prior MRI, correlation made with CT head 08/03/2009 FINDINGS: Brain: No abnormal parenchymal or meningeal enhancement. No restricted diffusion to suggest acute or subacute infarct. No acute hemorrhage, mass mass effect, or midline shift. No hydrocephalus or extra-axial collection. Cerebral volume is within normal limits for age. Vascular: Normal arterial flow voids. Venous sinuses are patent on postcontrast imaging. Skull and upper cervical spine: Normal marrow signal. No abnormal enhancement. Sinuses/Orbits: Minimal mucosal thickening in the ethmoid air cells. The orbits are unremarkable. Other: Trace fluid in the mastoid air cells. IMPRESSION: No acute intracranial process. No evidence of metastatic disease in the head. Electronically Signed   By: AMerilyn BabaM.D.   On: 01/28/2022 16:40   CT CHEST ABDOMEN PELVIS W CONTRAST  Result Date: 01/28/2022 CLINICAL DATA:  Follow-up metastatic breast carcinoma. Assess treatment response. * Tracking Code: BO * EXAM: CT CHEST, ABDOMEN, AND  PELVIS WITH CONTRAST TECHNIQUE: Multidetector CT imaging of the chest, abdomen and pelvis was performed following the standard protocol during bolus administration of intravenous contrast. RADIATION DOSE REDUCTION: This exam was performed according to the departmental dose-optimization program which includes automated exposure control, adjustment of the mA and/or kV according to patient size and/or use of iterative reconstruction technique. CONTRAST:  1074mOMNIPAQUE IOHEXOL 300 MG/ML  SOLN COMPARISON:  Chest CTA on 10/18/2021 FINDINGS: CT CHEST FINDINGS Cardiovascular: No acute findings. Mediastinum/Lymph Nodes: No masses or pathologically enlarged lymph nodes identified. Lungs/Pleura: No suspicious pulmonary nodules or masses identified. No evidence of infiltrate or pleural effusion. Musculoskeletal: No suspicious bone lesions identified. Increased diffuse left breast soft tissue edema and overlying skin thickening demonstrated, which could be due to inflammatory carcinoma or mastitis. CT ABDOMEN AND PELVIS FINDINGS Hepatobiliary: No masses identified. Gallbladder is unremarkable. No evidence of biliary ductal dilatation. Pancreas:  No mass or inflammatory changes. Spleen:  Within normal limits in size and appearance. Adrenals/Urinary tract:  No masses or hydronephrosis. Stomach/Bowel: No evidence of obstruction, inflammatory process, or abnormal fluid collections. Normal appendix visualized. Vascular/Lymphatic: No pathologically enlarged lymph nodes identified. No acute vascular findings. Reproductive:  No mass or other significant abnormality identified. Other:  None. Musculoskeletal:  No suspicious bone lesions identified. IMPRESSION: Increased diffuse left breast soft tissue edema and overlying skin thickening, which could be due to inflammatory carcinoma or mastitis. No evidence of metastatic disease within the chest, abdomen, or pelvis. Electronically Signed   By: JoMarlaine Hind.D.   On: 01/28/2022 18:53    [place] LTD UNI LEFT INC AXILLA  Result Date: 01/18/2022 CLINICAL DATA:  Patient with history of left breast lumpectomy and radiation treatment in 2021. Patient presents today for follow-up and reports new redness and swelling of the left breast. Patient was recently seen by Oncology for the breast redness. The redness did not resolve with antibiotic treatment. EXAM: DIGITAL DIAGNOSTIC BILATERAL MAMMOGRAM WITH TOMOSYNTHESIS AND CAD; ULTRASOUND LEFT BREAST LIMITED TECHNIQUE: Bilateral digital diagnostic mammography and breast tomosynthesis was performed. The images were evaluated with computer-aided detection.; Targeted ultrasound examination of the left breast was performed. COMPARISON:  Previous exam(s). ACR Breast Density Category c: The breast tissue is heterogeneously dense, which may obscure small masses. FINDINGS: Interval development of diffuse skin thickening overlying the left breast. Additionally, there is increased parenchymal and trabecular thickening throughout the left breast. No definite discrete mass is identified. Postlumpectomy changes within the  left breast. Stable appearance of the right breast. On physical exam, there is increased redness of the skin of the left breast. Targeted ultrasound is performed, showing extensive skin thickening overlying the left breast. Additionally there is underlying edema throughout the left breast. No discrete mass or fluid collection. IMPRESSION: Interval development of diffuse skin thickening overlying left breast with associated edema as well as parenchyma and trabecular thickening throughout the left breast. Patient has recently taken antibiotics without interval change. Considerations for the appearance of the left breast include post radiation changes, infectious process or inflammatory carcinoma. RECOMMENDATION: Skin punch biopsy of the left breast to evaluate for the possibility of inflammatory carcinoma. Additionally, recommend continued clinical  follow-up and possible repeat antibiotic therapy as clinically indicated for possible mastitis. Additionally, recommend follow-up left breast diagnostic mammogram in 2 months to reassess after the skin punch biopsy and possible retreatment with antibiotics. I have discussed the findings and recommendations with the patient. If applicable, a reminder letter will be sent to the patient regarding the next appointment. BI-RADS CATEGORY  4: Suspicious. Electronically Signed   By: Lovey Newcomer M.D.   On: 01/18/2022 11:16  MM DIAG BREAST TOMO BILATERAL  Result Date: 01/18/2022 CLINICAL DATA:  Patient with history of left breast lumpectomy and radiation treatment in 2021. Patient presents today for follow-up and reports new redness and swelling of the left breast. Patient was recently seen by Oncology for the breast redness. The redness did not resolve with antibiotic treatment. EXAM: DIGITAL DIAGNOSTIC BILATERAL MAMMOGRAM WITH TOMOSYNTHESIS AND CAD; ULTRASOUND LEFT BREAST LIMITED TECHNIQUE: Bilateral digital diagnostic mammography and breast tomosynthesis was performed. The images were evaluated with computer-aided detection.; Targeted ultrasound examination of the left breast was performed. COMPARISON:  Previous exam(s). ACR Breast Density Category c: The breast tissue is heterogeneously dense, which may obscure small masses. FINDINGS: Interval development of diffuse skin thickening overlying the left breast. Additionally, there is increased parenchymal and trabecular thickening throughout the left breast. No definite discrete mass is identified. Postlumpectomy changes within the left breast. Stable appearance of the right breast. On physical exam, there is increased redness of the skin of the left breast. Targeted ultrasound is performed, showing extensive skin thickening overlying the left breast. Additionally there is underlying edema throughout the left breast. No discrete mass or fluid collection. IMPRESSION:  Interval development of diffuse skin thickening overlying left breast with associated edema as well as parenchyma and trabecular thickening throughout the left breast. Patient has recently taken antibiotics without interval change. Considerations for the appearance of the left breast include post radiation changes, infectious process or inflammatory carcinoma. RECOMMENDATION: Skin punch biopsy of the left breast to evaluate for the possibility of inflammatory carcinoma. Additionally, recommend continued clinical follow-up and possible repeat antibiotic therapy as clinically indicated for possible mastitis. Additionally, recommend follow-up left breast diagnostic mammogram in 2 months to reassess after the skin punch biopsy and possible retreatment with antibiotics. I have discussed the findings and recommendations with the patient. If applicable, a reminder letter will be sent to the patient regarding the next appointment. BI-RADS CATEGORY  4: Suspicious. Electronically Signed   By: Lovey Newcomer M.D.   On: 01/18/2022 11:16   Labs:  CBC: Recent Labs    08/09/21 1233 08/23/21 1544 10/01/21 1004 01/28/22 1426  WBC 4.6 6.2 3.9 5.7  HGB 12.4 12.9 13.3 14.0  HCT 37.3 38.3 40.4 41.7  PLT 204 202 212 220    COAGS: No results for input(s): INR, APTT in the last 8760  hours.  BMP: Recent Labs    08/09/21 1233 08/23/21 1544 10/01/21 1004 01/28/22 1426 01/28/22 1719  NA 139 141 141 141  --   K 3.8 3.8 4.5 3.4*  --   CL 107 107 104 103  --   CO2 '24 23 22 30  '$ --   GLUCOSE 101* 108* 96 104*  --   BUN '16 14 10 11  '$ --   CALCIUM 9.2 9.3 9.6 9.9  --   CREATININE 0.75 0.81 0.73 0.71 0.70  GFRNONAA >60 >60  --  >60  --     LIVER FUNCTION TESTS: Recent Labs    08/09/21 1233 08/23/21 1544 10/01/21 1004 01/28/22 1426  BILITOT 0.2* 0.2* 0.3 0.6  AST '16 16 14 20  '$ ALT '15 15 16 18  '$ ALKPHOS 116 123 113 92  PROT 7.1 7.6 7.0 8.0  ALBUMIN 3.7 3.9 4.5 4.5    TUMOR MARKERS: No results for  input(s): AFPTM, CEA, CA199, CHROMGRNA in the last 8760 hours.  Assessment and Plan: Patient with past medical history of left-sided breast cancer presents in need of Port-A-Cath placement for upcoming chemotherapy.  IR consulted for Port-A-Cath palcement at the request of Dr. Chryl Heck. Case reviewed by Dr. Anselm Pancoast who approves patient for procedure.  Patient presents today in their usual state of health.  She has been NPO and is not currently on blood thinners.   Risks and benefits of image guided port-a-catheter placement was discussed with the patient including, but not limited to bleeding, infection, pneumothorax, or fibrin sheath development and need for additional procedures.  All of the patient's questions were answered, patient is agreeable to proceed. Consent signed and in chart.  Thank you for this interesting consult.  I greatly enjoyed meeting [name] and look forward to participating in their care.  A copy of this report was sent to the requesting provider on this date.  Electronically Signed: Docia Barrier, PA 02/11/2022, 1:11 PM   I spent a total of 25 minutes   in face to face in clinical consultation, greater than 50% of which was counseling/coordinating care for left-sided breast cancer.

## 2022-02-11 NOTE — Telephone Encounter (Signed)
Received consult case from [place]. Noted incorrect specimen was sent for review. Called pathology, request, punch bx specimen to be sent. Dr Chryl Heck notified

## 2022-02-11 NOTE — Procedures (Signed)
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  Right external jugular port, tip at SVC/RA junction  Complications: None     EBL: Minimal, less than 10 ml  Plan: Discharge in one hour.  Keep port site and incisions dry for at least 24 hours.     Bria Sparr R. Anselm Pancoast, MD  Pager: [phone]

## 2022-02-11 NOTE — Discharge Instructions (Signed)

## 2022-02-14 ENCOUNTER — Encounter: Payer: Self-pay | Admitting: Physician Assistant

## 2022-02-14 ENCOUNTER — Encounter: Payer: Self-pay | Admitting: *Deleted

## 2022-02-14 ENCOUNTER — Ambulatory Visit (INDEPENDENT_AMBULATORY_CARE_PROVIDER_SITE_OTHER): Payer: Self-pay | Admitting: Physician Assistant

## 2022-02-14 DIAGNOSIS — M546 Pain in thoracic spine: Secondary | ICD-10-CM

## 2022-02-14 NOTE — Progress Notes (Signed)
HPI: [name] returns today for follow-up of her mid back pain.  She states that therapy really did not help prednisone was minimally helpful.  She rates her pain to be 7-8 out of 10 pain at worst is constant.  No radicular symptoms down either arm or legs.  She does have waking pain.  Unfortunately she has had a recurrence of her breast cancer and is beginning treatment as of tomorrow.  In the interim she has had an MRI of her thoracic spine which I reviewed the images with her.  This shows T12-L1 small noncompressive oppressive disc protrusion.  Otherwise disc space narrowing at T12-L1.  Canals patent foramina are patent.  No evidence of metastatic disease.   Review of systems: Denies any fevers chills or weight loss  Impression: Thoracic disc bulge at T12-L1  Plan: At this point time would recommend no intervention outside of continued exercises as taught by therapy.  Reviewed with her using interpreter the MRI images and findings.  Questions were encouraged and answered at length follow-up as needed.

## 2022-02-15 ENCOUNTER — Inpatient Hospital Stay: Payer: No Typology Code available for payment source | Attending: Physician Assistant

## 2022-02-15 ENCOUNTER — Other Ambulatory Visit: Payer: Self-pay | Admitting: *Deleted

## 2022-02-15 ENCOUNTER — Inpatient Hospital Stay: Payer: No Typology Code available for payment source

## 2022-02-15 ENCOUNTER — Other Ambulatory Visit: Payer: Self-pay

## 2022-02-15 VITALS — BP 146/75 | HR 100 | Temp 98.2°F | Resp 16 | Wt 190.0 lb

## 2022-02-15 DIAGNOSIS — C50412 Malignant neoplasm of upper-outer quadrant of left female breast: Secondary | ICD-10-CM

## 2022-02-15 DIAGNOSIS — Z5111 Encounter for antineoplastic chemotherapy: Secondary | ICD-10-CM | POA: Insufficient documentation

## 2022-02-15 DIAGNOSIS — Z95828 Presence of other vascular implants and grafts: Secondary | ICD-10-CM | POA: Insufficient documentation

## 2022-02-15 DIAGNOSIS — Z79891 Long term (current) use of opiate analgesic: Secondary | ICD-10-CM | POA: Insufficient documentation

## 2022-02-15 DIAGNOSIS — Z171 Estrogen receptor negative status [ER-]: Secondary | ICD-10-CM | POA: Insufficient documentation

## 2022-02-15 DIAGNOSIS — K0889 Other specified disorders of teeth and supporting structures: Secondary | ICD-10-CM | POA: Insufficient documentation

## 2022-02-15 DIAGNOSIS — Z923 Personal history of irradiation: Secondary | ICD-10-CM | POA: Insufficient documentation

## 2022-02-15 DIAGNOSIS — G893 Neoplasm related pain (acute) (chronic): Secondary | ICD-10-CM | POA: Insufficient documentation

## 2022-02-15 DIAGNOSIS — Z5189 Encounter for other specified aftercare: Secondary | ICD-10-CM | POA: Insufficient documentation

## 2022-02-15 LAB — CMP (CANCER CENTER ONLY)
ALT: 13 U/L (ref 0–44)
AST: 15 U/L (ref 15–41)
Albumin: 4.1 g/dL (ref 3.5–5.0)
Alkaline Phosphatase: 85 U/L (ref 38–126)
Anion gap: 6 (ref 5–15)
BUN: 13 mg/dL (ref 6–20)
CO2: 28 mmol/L (ref 22–32)
Calcium: 9.8 mg/dL (ref 8.9–10.3)
Chloride: 104 mmol/L (ref 98–111)
Creatinine: 0.69 mg/dL (ref 0.44–1.00)
GFR, Estimated: >60 mL/min (ref >60)
Glucose, Bld: 112 mg/dL — ABNORMAL HIGH (ref 70–99)
Potassium: 4.1 mmol/L (ref 3.5–5.1)
Sodium: 138 mmol/L (ref 135–145)
Total Bilirubin: 0.4 mg/dL (ref 0.3–1.2)
Total Protein: 7.3 g/dL (ref 6.5–8.1)

## 2022-02-15 LAB — CBC WITH DIFFERENTIAL (CANCER CENTER ONLY)
Abs Immature Granulocytes: 0.02 10*3/uL (ref 0.00–0.07)
Basophils Absolute: 0 10*3/uL (ref 0.0–0.1)
Basophils Relative: 0 %
Eosinophils Absolute: 0.1 10*3/uL (ref 0.0–0.5)
Eosinophils Relative: 1 %
HCT: 36.5 % (ref 36.0–46.0)
Hemoglobin: 12.4 g/dL (ref 12.0–15.0)
Immature Granulocytes: 0 %
Lymphocytes Relative: 26 %
Lymphs Abs: 1.3 10*3/uL (ref 0.7–4.0)
MCH: 28.4 pg (ref 26.0–34.0)
MCHC: 34 g/dL (ref 30.0–36.0)
MCV: 83.5 fL (ref 80.0–100.0)
Monocytes Absolute: 0.4 10*3/uL (ref 0.1–1.0)
Monocytes Relative: 8 %
Neutro Abs: 3.2 10*3/uL (ref 1.7–7.7)
Neutrophils Relative %: 65 %
Platelet Count: 194 10*3/uL (ref 150–400)
RBC: 4.37 MIL/uL (ref 3.87–5.11)
RDW: 12.7 % (ref 11.5–15.5)
WBC Count: 5 10*3/uL (ref 4.0–10.5)
nRBC: 0 % (ref 0.0–0.2)

## 2022-02-15 MED ORDER — HEPARIN SOD (PORK) LOCK FLUSH 100 UNIT/ML IV SOLN
500.0000 [IU] | Freq: Once | INTRAVENOUS | Status: AC | PRN
  Administered 2022-02-15: 500 [IU]

## 2022-02-15 MED ORDER — PROCHLORPERAZINE MALEATE 10 MG PO TABS: 10.0000 mg | ORAL_TABLET | Freq: Three times a day (TID) | ORAL | 0 refills | Status: DC | PRN

## 2022-02-15 MED ORDER — SODIUM CHLORIDE 0.9 % IV SOLN
10.0000 mg | Freq: Once | INTRAVENOUS | Status: AC
  Administered 2022-02-15: 10 mg via INTRAVENOUS
  Filled 2022-02-15: qty 10

## 2022-02-15 MED ORDER — SODIUM CHLORIDE 0.9 % IV SOLN
600.0000 mg/m2 | Freq: Once | INTRAVENOUS | Status: AC
  Administered 2022-02-15: 1140 mg via INTRAVENOUS
  Filled 2022-02-15: qty 57

## 2022-02-15 MED ORDER — SODIUM CHLORIDE 0.9% FLUSH
10.0000 mL | Freq: Once | INTRAVENOUS | Status: AC
  Administered 2022-02-15: 10 mL

## 2022-02-15 MED ORDER — SODIUM CHLORIDE 0.9 % IV SOLN: Freq: Once | INTRAVENOUS | Status: AC

## 2022-02-15 MED ORDER — DEXAMETHASONE 4 MG PO TABS: ORAL_TABLET | ORAL | 6 refills | Status: DC

## 2022-02-15 MED ORDER — PALONOSETRON HCL INJECTION 0.25 MG/5ML
0.2500 mg | Freq: Once | INTRAVENOUS | Status: AC
  Administered 2022-02-15: 0.25 mg via INTRAVENOUS
  Filled 2022-02-15: qty 5

## 2022-02-15 MED ORDER — TRAMADOL HCL 50 MG PO TABS: 50.0000 mg | ORAL_TABLET | Freq: Once | ORAL | Status: DC

## 2022-02-15 MED ORDER — TRAMADOL HCL 50 MG PO TABS
50.0000 mg | ORAL_TABLET | Freq: Once | ORAL | Status: AC
  Administered 2022-02-15: 50 mg via ORAL
  Filled 2022-02-15: qty 1

## 2022-02-15 MED ORDER — SODIUM CHLORIDE 0.9% FLUSH
10.0000 mL | INTRAVENOUS | Status: DC | PRN
  Administered 2022-02-15: 10 mL

## 2022-02-15 MED ORDER — ONDANSETRON HCL 8 MG PO TABS: ORAL_TABLET | ORAL | 3 refills | Status: DC

## 2022-02-15 MED ORDER — SODIUM CHLORIDE 0.9 % IV SOLN
75.0000 mg/m2 | Freq: Once | INTRAVENOUS | Status: AC
  Administered 2022-02-15: 140 mg via INTRAVENOUS
  Filled 2022-02-15: qty 14

## 2022-02-15 NOTE — Progress Notes (Signed)
During assessment today in inf, Pt had c/o of back pain 7/10 and breast pain 8/10.  RN informed MD.  MD ordered tramadol 50 mg to be given in inf today.

## 2022-02-15 NOTE — Patient Instructions (Signed)
[facility]  Discharge Instructions: Thank you for choosing [facility] to provide your oncology and hematology care.   If you have a lab appointment with the [facility], please go directly to the [facility] and check in at the registration area.   Wear comfortable clothing and clothing appropriate for easy access to any Portacath or PICC line.   We strive to give you quality time with your provider. You may need to reschedule your appointment if you arrive late (15 or more minutes).  Arriving late affects you and other patients whose appointments are after yours.  Also, if you miss three or more appointments without notifying the office, you may be dismissed from the clinic at the provider's discretion.      For prescription refill requests, have your pharmacy contact our office and allow 72 hours for refills to be completed.    Today you received the following chemotherapy and/or immunotherapy agents: Docetaxel & Cytoxan     To help prevent nausea and vomiting after your treatment, we encourage you to take your nausea medication as directed.  BELOW ARE SYMPTOMS THAT SHOULD BE REPORTED IMMEDIATELY: *FEVER GREATER THAN 100.4 F (38 C) OR HIGHER *CHILLS OR SWEATING *NAUSEA AND VOMITING THAT IS NOT CONTROLLED WITH YOUR NAUSEA MEDICATION *UNUSUAL SHORTNESS OF BREATH *UNUSUAL BRUISING OR BLEEDING *URINARY PROBLEMS (pain or burning when urinating, or frequent urination) *BOWEL PROBLEMS (unusual diarrhea, constipation, pain near the anus) TENDERNESS IN MOUTH AND THROAT WITH OR WITHOUT PRESENCE OF ULCERS (sore throat, sores in mouth, or a toothache) UNUSUAL RASH, SWELLING OR PAIN  UNUSUAL VAGINAL DISCHARGE OR ITCHING   Items with * indicate a potential emergency and should be followed up as soon as possible or go to the Emergency Department if any problems should occur.  Please show the CHEMOTHERAPY ALERT CARD or IMMUNOTHERAPY ALERT CARD at  check-in to the Emergency Department and triage nurse.  Should you have questions after your visit or need to cancel or reschedule your appointment, please contact [facility]  Dept: [phone]  and follow the prompts.  Office hours are 8:00 a.m. to 4:30 p.m. Monday - Friday. Please note that voicemails left after 4:00 p.m. may not be returned until the following business day.  We are closed weekends and major holidays. You have access to a nurse at all times for urgent questions. Please call the main number to the clinic Dept: [phone] and follow the prompts.   For any non-urgent questions, you may also contact your provider using MyChart. We now offer e-Visits for anyone 55 and older to request care online for non-urgent symptoms. For details visit [email or web address].   Also download the MyChart app! Go to the app store, search "MyChart", open the app, select Time, and log in with your MyChart username and password.  Due to Covid, a mask is required upon entering the hospital/clinic. If you do not have a mask, one will be given to you upon arrival. For doctor visits, patients may have 1 support person aged 41 or older with them. For treatment visits, patients cannot have anyone with them due to current Covid guidelines and our immunocompromised population.

## 2022-02-16 ENCOUNTER — Telehealth: Payer: Self-pay

## 2022-02-16 NOTE — Telephone Encounter (Signed)
LM for patient that this nurse was calling to see how they were doing after their treatment. Please call back to Dr. Iruku's nurse at [phone] if they have any questions or concerns regarding the treatment. 

## 2022-02-16 NOTE — Telephone Encounter (Signed)
-----   Message from Willis Modena, RN sent at 02/15/2022 12:55 PM EDT ----- Regarding: Dr. Chryl Heck 1st time Docetaxel/Cytoxan f/u tol well Dr. Chryl Heck 1st time Docetaxel/Cytoxan f/u [facility] well. Pt call back due 02/16/22. Pt is Spanish speaking--interpreter needed.

## 2022-02-17 ENCOUNTER — Other Ambulatory Visit: Payer: Self-pay

## 2022-02-17 ENCOUNTER — Inpatient Hospital Stay: Payer: No Typology Code available for payment source

## 2022-02-17 DIAGNOSIS — C50412 Malignant neoplasm of upper-outer quadrant of left female breast: Secondary | ICD-10-CM

## 2022-02-17 MED ORDER — PEGFILGRASTIM-CBQV 6 MG/0.6ML ~~LOC~~ SOSY
6.0000 mg | PREFILLED_SYRINGE | Freq: Once | SUBCUTANEOUS | Status: AC
  Administered 2022-02-17: 6 mg via SUBCUTANEOUS
  Filled 2022-02-17: qty 0.6

## 2022-02-21 ENCOUNTER — Other Ambulatory Visit: Payer: Self-pay | Admitting: Physician Assistant

## 2022-02-21 DIAGNOSIS — Z170 Estrogen receptor positive status [ER+]: Secondary | ICD-10-CM

## 2022-02-22 ENCOUNTER — Other Ambulatory Visit: Payer: Self-pay

## 2022-02-22 ENCOUNTER — Inpatient Hospital Stay: Payer: No Typology Code available for payment source

## 2022-02-22 ENCOUNTER — Ambulatory Visit (HOSPITAL_COMMUNITY)
Admission: RE | Admit: 2022-02-22 | Discharge: 2022-02-22 | Disposition: A | Discharge status: Home / Self Care | Payer: No Typology Code available for payment source | Source: Ambulatory Visit | Attending: Physician Assistant | Admitting: Physician Assistant

## 2022-02-22 ENCOUNTER — Inpatient Hospital Stay (HOSPITAL_BASED_OUTPATIENT_CLINIC_OR_DEPARTMENT_OTHER): Payer: No Typology Code available for payment source | Admitting: Physician Assistant

## 2022-02-22 VITALS — BP 105/51 | HR 84 | Temp 97.3°F | Resp 15 | Wt 191.3 lb

## 2022-02-22 DIAGNOSIS — Z170 Estrogen receptor positive status [ER+]: Secondary | ICD-10-CM

## 2022-02-22 DIAGNOSIS — C50919 Malignant neoplasm of unspecified site of unspecified female breast: Secondary | ICD-10-CM

## 2022-02-22 DIAGNOSIS — R051 Acute cough: Secondary | ICD-10-CM | POA: Insufficient documentation

## 2022-02-22 LAB — CBC WITH DIFFERENTIAL (CANCER CENTER ONLY)
Abs Immature Granulocytes: 2.58 10*3/uL — ABNORMAL HIGH (ref 0.00–0.07)
Basophils Absolute: 0 10*3/uL (ref 0.0–0.1)
Basophils Relative: 0 %
Eosinophils Absolute: 0 10*3/uL (ref 0.0–0.5)
Eosinophils Relative: 0 %
HCT: 35.6 % — ABNORMAL LOW (ref 36.0–46.0)
Hemoglobin: 12.1 g/dL (ref 12.0–15.0)
Immature Granulocytes: 25 %
Lymphocytes Relative: 9 %
Lymphs Abs: 0.9 10*3/uL (ref 0.7–4.0)
MCH: 28.8 pg (ref 26.0–34.0)
MCHC: 34 g/dL (ref 30.0–36.0)
MCV: 84.8 fL (ref 80.0–100.0)
Monocytes Absolute: 1.3 10*3/uL — ABNORMAL HIGH (ref 0.1–1.0)
Monocytes Relative: 13 %
Neutro Abs: 5.4 10*3/uL (ref 1.7–7.7)
Neutrophils Relative %: 53 %
Platelet Count: 133 10*3/uL — ABNORMAL LOW (ref 150–400)
RBC: 4.2 MIL/uL (ref 3.87–5.11)
RDW: 13.1 % (ref 11.5–15.5)
WBC Count: 10.2 10*3/uL (ref 4.0–10.5)
nRBC: 0.6 % — ABNORMAL HIGH (ref 0.0–0.2)

## 2022-02-22 LAB — CMP (CANCER CENTER ONLY)
ALT: 17 U/L (ref 0–44)
AST: 20 U/L (ref 15–41)
Albumin: 3.8 g/dL (ref 3.5–5.0)
Alkaline Phosphatase: 89 U/L (ref 38–126)
Anion gap: 5 (ref 5–15)
BUN: 12 mg/dL (ref 6–20)
CO2: 29 mmol/L (ref 22–32)
Calcium: 9.3 mg/dL (ref 8.9–10.3)
Chloride: 106 mmol/L (ref 98–111)
Creatinine: 0.7 mg/dL (ref 0.44–1.00)
GFR, Estimated: >60 mL/min (ref >60)
Glucose, Bld: 103 mg/dL — ABNORMAL HIGH (ref 70–99)
Potassium: 4.3 mmol/L (ref 3.5–5.1)
Sodium: 140 mmol/L (ref 135–145)
Total Bilirubin: 0.3 mg/dL (ref 0.3–1.2)
Total Protein: 6.6 g/dL (ref 6.5–8.1)

## 2022-02-22 NOTE — Progress Notes (Signed)
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  2) 02/07/2020: Chest CT scan and bone scan showed no evidence of metastatic disease  3) 02/12/2020-03/25/2020: Received neoadjuvant chemotherapy consisting of doxorubicin and cyclophosphamide in dose dense fashion x4. Epirubicin substituted for Doxorubicin starting with cycle 2 of AC secondary to allergic rash from Doxorubicin  4) 07/15/2020: Underwent left lumpectomy and sentinel lymph node sampling for a ypT1b ypN0 residual invasive ductal carcinoma, grade 3, with negative margins.             (a) a total of 4 left axillary lymph nodes were removed             (b) repeat prognostic panel on the final pathology again triple negative, with an MIB-1 of 50%.   5) 10/09/2020: Completed adjuvant radiation              (a) sensitizing capecitabine attempted but not tolerated by the patient   6) Genetics testing 02/07/2020 through the [facility] Common Hereditary Cancers Panel found no deleterious mutations in  APC, ATM, AXIN2, BARD1, BMPR1A, BRCA1, BRCA2, BRIP1, CDH1, CDKN2A (p14ARF), CDKN2A (p16INK4a), CKD4, CHEK2, CTNNA1, DICER1, EPCAM (Deletion/duplication testing only), GREM1 (promoter region deletion/duplication testing only), KIT, MEN1, MLH1, MSH2, MSH3, MSH6, MUTYH,  NBN, NF1, NHTL1, PALB2, PDGFRA, PMS2, POLD1, POLE, PTEN, RAD50, RAD51C, RAD51D, RNF43, SDHB, SDHC, SDHD, SMAD4, SMARCA4. STK11, TP53, TSC1, TSC2, and VHL.  The following genes were evaluated for sequence changes only: SDHA and HOXB13 c.251G>A variant only.             (a) VUS in MSH6 called c.2744C>G identified.    7) 11/23/2020-01/04/2021: Received adjuvant pembrolizumab, planned for 1 year but discontinued after 01/04/2021 dose with multiple side effects requiring steroids for resolution  Inflammatory Breast Cancer: 1) 01/03/2022: Presented with erythematous enlarging left breast x 6 months  2) 01/18/2022: Underwent punch biopsy of left breast skin. Pathology revealed inflammatory breast cancer. Biomarkers are reported as negative for ER, PR, Her2/neu  3)  01/28/2022: MRI brain and CT CAP were negative for metastatic disease.   4) 02/15/2022: Started chemotherapy with Taoxtere and Cytoxan.   CURRENT THERAPY:  Taxotere and Cytoxan, started on 02/15/2022  INTERVAL HISTORY: [name] 46 y.o. female returns for a follow up after completing cycle 1 of taxotere and cytoxan last week. She is unaccompanied for this visit.   She reports feeling tired but still completes her ADLs on her own. She has a good appetite and denies any weight loss. She denies any nausea or vomiting episodes. She reports the redness involving the left breast is stable over the last week. She reports occasional episodes of diarrhea or constipation. She denies easy bruising or signs of active bleeding. She experienced body aches after receiving Udenyca injection which has slowly improved. She is not taking Claritin. She has some shortness  of breath with exertion and recently has some upper respiratory congestions and cough for the last four days. She denies any fevers or chills. She has no other complaints.    Patient Active Problem List   Diagnosis Date Noted   Port-A-Cath in place 02/15/2022   Oral allergy syndrome,  subsequent encounter 02/03/2022   Adverse effect of other drugs, medicaments and biological substances, subsequent encounter 02/02/2022   Other adverse food reactions, not elsewhere classified, subsequent encounter 02/02/2022   Chest tightness 02/02/2022   Other allergic rhinitis 02/02/2022   Allergic conjunctivitis of both eyes 02/02/2022   Language barrier 12/11/2020   Morbid obesity ([place])    History of breast cancer    Genetic testing 02/13/2020   Malignant neoplasm of upper-outer quadrant of left breast in female, estrogen receptor positive ([place]) 01/27/2020   Impaired ability to use community resources due to language barrier 07/13/2013    is allergic to omnipaque [iohexol], doxorubicin hcl, keytruda [pembrolizumab], other, and tape.  MEDICAL HISTORY: Past Medical History:  Diagnosis Date   Allergy    Asthma    Breast cancer in female [facility])    Left   Breast disorder    breast cancer May 2021   Chronic back pain    History of breast cancer    History of COVID-19 04/20/2021   Hx of migraines    Morbid obesity ([place])    Personal history of chemotherapy    Personal history of radiation therapy     SURGICAL HISTORY: Past Surgical History:  Procedure Laterality Date   BREAST LUMPECTOMY     BREAST LUMPECTOMY WITH RADIOACTIVE SEED AND SENTINEL LYMPH NODE BIOPSY Left 07/15/2020   Procedure: LEFT BREAST LUMPECTOMY WITH RADIOACTIVE SEED AND LEFT AXILLARY SENTINEL LYMPH NODE BIOPSY, LEFT AXILLARY NODE RADIOACTIVE SEED GUIDED EXCISION, LEFT BREAST RADIOACTIVE SEED GUIDED EXCISION IM NODE;  Surgeon: Rolm Bookbinder, MD;  Location: [facility];  Service: General;  Laterality: Left;  PEC BLOCK   CESAREAN SECTION WITH BILATERAL TUBAL LIGATION Bilateral 07/23/2015   Procedure: CESAREAN SECTION WITH BILATERAL TUBAL LIGATION;  Surgeon: Donnamae Jude, MD;  Location: [facility];  Service: Obstetrics;  Laterality: Bilateral;   IR IMAGING GUIDED PORT INSERTION  02/11/2022   PORT-A-CATH REMOVAL N/A  04/30/2021   Procedure: REMOVAL PORT-A-CATH;  Surgeon: Rolm Bookbinder, MD;  Location: [facility];  Service: General;  Laterality: N/A;   PORTACATH PLACEMENT Right 02/11/2020   Procedure: INSERTION PORT-A-CATH WITH ULTRASOUND GUIDANCE;  Surgeon: Rolm Bookbinder, MD;  Location: [facility];  Service: General;  Laterality: Right;   TUBAL LIGATION      SOCIAL HISTORY: Social History   Socioeconomic History   Marital status: Legally Separated    Spouse name: Not on file   Number of children: 3   Years of education: Not on file   Highest education level: 9th grade  Occupational History   Occupation: Architect  Tobacco Use   Smoking status: Never   Smokeless tobacco: Never  Vaping Use   Vaping Use: Never used  Substance and Sexual Activity   Alcohol use: No   Drug use: Never   Sexual activity: Yes    Birth control/protection: Surgical  Other Topics Concern   Not on file  Social History Narrative   Not on file   Social Determinants of Health   Financial Resource Strain: Not on file  Food Insecurity: No Food Insecurity (02/23/2021)   Hunger Vital Sign    Worried About Running Out of Food in  the Last Year: Never true    [place] in the Last Year: Never true  Transportation Needs: No Transportation Needs (02/23/2021)   PRAPARE - Hydrologist (Medical): No    Lack of Transportation (Non-Medical): No  Physical Activity: Not on file  Stress: Not on file  Social Connections: Not on file  Intimate Partner Violence: Not on file    FAMILY HISTORY: Family History  Problem Relation Age of Onset   Heart disease Maternal Grandmother     Review of Systems  Constitutional:  Negative for appetite change, chills, fatigue, fever and unexpected weight change.  HENT:   Negative for hearing loss, lump/mass and trouble swallowing.   Eyes:  Negative for eye problems and icterus.  Respiratory:  Positive for cough and  shortness of breath. Negative for chest tightness.   Cardiovascular:  Negative for chest pain, leg swelling and palpitations.  Gastrointestinal:  Positive for constipation and diarrhea. Negative for abdominal distention, abdominal pain, nausea and vomiting.  Endocrine: Negative for hot flashes.  Genitourinary:  Negative for difficulty urinating.   Musculoskeletal:  Positive for myalgias (After GCSF injection). Negative for gait problem.  Skin:  Positive for rash (redness of left breast and back.). Negative for itching.  Neurological:  Negative for dizziness, extremity weakness, gait problem, headaches, light-headedness, numbness, seizures and speech difficulty.  Hematological:  Negative for adenopathy. Does not bruise/bleed easily.  Psychiatric/Behavioral:  Negative for depression. The patient is not nervous/anxious.     PHYSICAL EXAMINATION  ECOG PERFORMANCE STATUS: 1 - Symptomatic but completely ambulatory  There were no vitals filed for this visit.  Constitutional: Oriented to person, place, and time and well-developed, well-nourished, and in no distress.  HENT:  Head: Normocephalic and atraumatic.  Eyes: Conjunctivae are normal. Right eye exhibits no discharge. Left eye exhibits no discharge. No scleral icterus.  Neck: Normal range of motion. Neck supple.   Cardiovascular: Normal rate, regular rhythm, normal heart sounds and intact distal pulses.   Pulmonary/Chest: Effort normal and breath sounds normal. No respiratory distress. No wheezes. No rales.  Abdominal: Soft. Bowel sounds are normal. Exhibits no distension and no mass. There is no tenderness.  Musculoskeletal: Normal range of motion. Exhibits no edema.  Lymphadenopathy: No cervical adenopathy.  Neurological: Alert and oriented to person, place, and time. Exhibits normal muscle tone. Gait normal. Coordination normal.  Skin: Skin is warm and dry. No rash noted. Not diaphoretic. No erythema. No pallor.  Psychiatric: Mood,  memory and judgment normal.     LABORATORY DATA: Recent Results (from the past 2160 hour(s))  Vitamin D, 25-hydroxy     Status: Abnormal   Collection Time: 11/29/21 10:41 AM  Result Value Ref Range   Vit D, 25-Hydroxy 20.0 (L) 30.0 - 100.0 ng/mL    Comment: Vitamin D deficiency has been defined by the [place] practice guideline as a level of serum 25-OH vitamin D less than 20 ng/mL (1,2). The Endocrine Society went on to further define vitamin D insufficiency as a level between 21 and 29 ng/mL (2). 1. IOM (Institute of Medicine). 2010. Dietary reference    intakes for calcium and D. [place]: The    [employer]. 2. Holick MF, Binkley [place], Bischoff-Ferrari HA, et al.    Evaluation, treatment, and prevention of vitamin D    deficiency: an Endocrine Society clinical practice    guideline. JCEM. 2011 Jul; 96(7):1911-30.   Vitamin B12  Status: None   Collection Time: 11/29/21 10:41 AM  Result Value Ref Range   Vitamin B-12 433 232 - 1,245 pg/mL  Hemoglobin A1c     Status: None   Collection Time: 11/29/21 10:41 AM  Result Value Ref Range   Hgb A1c MFr Bld 5.6 4.8 - 5.6 %    Comment:          Prediabetes: 5.7 - 6.4          Diabetes: >6.4          Glycemic control for adults with diabetes: <7.0    Est. average glucose Bld gHb Est-mCnc 114 mg/dL  POCT URINALYSIS DIP (CLINITEK)     Status: None   Collection Time: 11/29/21 11:56 AM  Result Value Ref Range   Color, UA yellow yellow   Clarity, UA clear clear   Glucose, UA negative negative mg/dL   Bilirubin, UA negative negative   Ketones, POC UA negative negative mg/dL   Spec Grav, UA 1.020 1.010 - 1.025   Blood, UA negative negative   pH, UA 5.5 5.0 - 8.0   POC PROTEIN,UA negative negative, trace   Urobilinogen, UA 0.2 0.2 or 1.0 E.U./dL   Nitrite, UA Negative Negative   Leukocytes, UA Negative Negative  Pregnancy, urine     Status: None   Collection Time: 01/28/22   2:26 PM  Result Value Ref Range   Preg Test, Ur NEGATIVE NEGATIVE    Comment:        THE SENSITIVITY OF THIS METHODOLOGY IS >20 mIU/mL. Performed at [facility], [place] [address] [place], [place] [address]   CA 27.29     Status: None   Collection Time: 01/28/22  2:26 PM  Result Value Ref Range   CA 27.29 15.2 0.0 - 38.6 U/mL    Comment: (NOTE) Siemens Centaur Immunochemiluminometric Methodology [facility]) Values obtained with different assay methods or kits cannot be used interchangeably. Results cannot be interpreted as absolute evidence of the presence or absence of malignant disease. Performed At: [facility] [place], [place] [id number] Rush Farmer MD YI:[id number]   Lactate dehydrogenase (LDH)     Status: None   Collection Time: 01/28/22  2:26 PM  Result Value Ref Range   LDH 136 98 - 192 U/L    Comment: Performed at [facility], [place] [address], [place], [place] [address]  CBC with Differential ([place] Only)     Status: None   Collection Time: 01/28/22  2:26 PM  Result Value Ref Range   WBC Count 5.7 4.0 - 10.5 K/uL   RBC 4.91 3.87 - 5.11 MIL/uL   Hemoglobin 14.0 12.0 - 15.0 g/dL   HCT 41.7 36.0 - 46.0 %   MCV 84.9 80.0 - 100.0 fL   MCH 28.5 26.0 - 34.0 pg   MCHC 33.6 30.0 - 36.0 g/dL   RDW 13.2 11.5 - 15.5 %   Platelet Count 220 150 - 400 K/uL   nRBC 0.0 0.0 - 0.2 %   Neutrophils Relative % 53 %   Neutro Abs 3.0 1.7 - 7.7 K/uL   Lymphocytes Relative 37 %   Lymphs Abs 2.1 0.7 - 4.0 K/uL   Monocytes Relative 7 %   Monocytes Absolute 0.4 0.1 - 1.0 K/uL   Eosinophils Relative 2 %   Eosinophils Absolute 0.1 0.0 - 0.5 K/uL   Basophils Relative 1 %   Basophils Absolute 0.0 0.0 - 0.1 K/uL   Immature  Granulocytes 0 %   Abs Immature Granulocytes 0.02 0.00 - 0.07 K/uL    Comment: Performed at [facility], [place] [address] [place], [place] [address]  CMP ([place]  only)     Status: Abnormal   Collection Time: 01/28/22  2:26 PM  Result Value Ref Range   Sodium 141 135 - 145 mmol/L   Potassium 3.4 (L) 3.5 - 5.1 mmol/L   Chloride 103 98 - 111 mmol/L   CO2 30 22 - 32 mmol/L   Glucose, Bld 104 (H) 70 - 99 mg/dL    Comment: Glucose reference range applies only to samples taken after fasting for at least 8 hours.   BUN 11 6 - 20 mg/dL   Creatinine 0.71 0.44 - 1.00 mg/dL   Calcium 9.9 8.9 - 10.3 mg/dL   Total Protein 8.0 6.5 - 8.1 g/dL   Albumin 4.5 3.5 - 5.0 g/dL   AST 20 15 - 41 U/L   ALT 18 0 - 44 U/L   Alkaline Phosphatase 92 38 - 126 U/L   Total Bilirubin 0.6 0.3 - 1.2 mg/dL   GFR, Estimated >60 >60 mL/min    Comment: (NOTE) Calculated using the CKD-EPI Creatinine Equation (2021)    Anion gap 8 5 - 15    Comment: Performed at [facility], [place] [address] [place], [place] [address]  Thomas-STAT creatinine     Status: None   Collection Time: 01/28/22  5:19 PM  Result Value Ref Range   Creatinine, Ser 0.70 0.44 - 1.00 mg/dL  CMP (Cancer Center only)     Status: Abnormal   Collection Time: 02/15/22  8:03 AM  Result Value Ref Range   Sodium 138 135 - 145 mmol/L   Potassium 4.1 3.5 - 5.1 mmol/L   Chloride 104 98 - 111 mmol/L   CO2 28 22 - 32 mmol/L   Glucose, Bld 112 (H) 70 - 99 mg/dL    Comment: Glucose reference range applies only to samples taken after fasting for at least 8 hours.   BUN 13 6 - 20 mg/dL   Creatinine 0.69 0.44 - 1.00 mg/dL   Calcium 9.8 8.9 - 10.3 mg/dL   Total Protein 7.3 6.5 - 8.1 g/dL   Albumin 4.1 3.5 - 5.0 g/dL   AST 15 15 - 41 U/L   ALT 13 0 - 44 U/L   Alkaline Phosphatase 85 38 - 126 U/L   Total Bilirubin 0.4 0.3 - 1.2 mg/dL   GFR, Estimated >60 >60 mL/min    Comment: (NOTE) Calculated using the CKD-EPI Creatinine Equation (2021)    Anion gap 6 5 - 15    Comment: Performed at [facility], [place] [address] [place], [place] [address]  CBC with Differential ([place]  Only)     Status: None   Collection Time: 02/15/22  8:03 AM  Result Value Ref Range   WBC Count 5.0 4.0 - 10.5 K/uL   RBC 4.37 3.87 - 5.11 MIL/uL   Hemoglobin 12.4 12.0 - 15.0 g/dL   HCT 36.5 36.0 - 46.0 %   MCV 83.5 80.0 - 100.0 fL   MCH 28.4 26.0 - 34.0 pg   MCHC 34.0 30.0 - 36.0 g/dL   RDW 12.7 11.5 - 15.5 %   Platelet Count 194 150 - 400 K/uL   nRBC 0.0 0.0 - 0.2 %   Neutrophils Relative % 65 %   Neutro Abs 3.2 1.7 - 7.7 K/uL  Lymphocytes Relative 26 %   Lymphs Abs 1.3 0.7 - 4.0 K/uL   Monocytes Relative 8 %   Monocytes Absolute 0.4 0.1 - 1.0 K/uL   Eosinophils Relative 1 %   Eosinophils Absolute 0.1 0.0 - 0.5 K/uL   Basophils Relative 0 %   Basophils Absolute 0.0 0.0 - 0.1 K/uL   Immature Granulocytes 0 %   Abs Immature Granulocytes 0.02 0.00 - 0.07 K/uL    Comment: Performed at [facility], [place] [address] [place], [place] [address]  CMP ([place] only)     Status: Abnormal   Collection Time: 02/22/22  9:10 AM  Result Value Ref Range   Sodium 140 135 - 145 mmol/L   Potassium 4.3 3.5 - 5.1 mmol/L   Chloride 106 98 - 111 mmol/L   CO2 29 22 - 32 mmol/L   Glucose, Bld 103 (H) 70 - 99 mg/dL    Comment: Glucose reference range applies only to samples taken after fasting for at least 8 hours.   BUN 12 6 - 20 mg/dL   Creatinine 0.70 0.44 - 1.00 mg/dL   Calcium 9.3 8.9 - 10.3 mg/dL   Total Protein 6.6 6.5 - 8.1 g/dL   Albumin 3.8 3.5 - 5.0 g/dL   AST 20 15 - 41 U/L   ALT 17 0 - 44 U/L   Alkaline Phosphatase 89 38 - 126 U/L   Total Bilirubin 0.3 0.3 - 1.2 mg/dL   GFR, Estimated >60 >60 mL/min    Comment: (NOTE) Calculated using the CKD-EPI Creatinine Equation (2021)    Anion gap 5 5 - 15    Comment: Performed at [facility], [place] [address], [place], [place] [address]  CBC with Differential ([place] Only)     Status: Abnormal   Collection Time: 02/22/22  9:10 AM  Result Value Ref Range   WBC Count 10.2 4.0  - 10.5 K/uL   RBC 4.20 3.87 - 5.11 MIL/uL   Hemoglobin 12.1 12.0 - 15.0 g/dL   HCT 35.6 (L) 36.0 - 46.0 %   MCV 84.8 80.0 - 100.0 fL   MCH 28.8 26.0 - 34.0 pg   MCHC 34.0 30.0 - 36.0 g/dL   RDW 13.1 11.5 - 15.5 %   Platelet Count 133 (L) 150 - 400 K/uL   nRBC 0.6 (H) 0.0 - 0.2 %   Neutrophils Relative % 53 %   Neutro Abs 5.4 1.7 - 7.7 K/uL   Lymphocytes Relative 9 %   Lymphs Abs 0.9 0.7 - 4.0 K/uL   Monocytes Relative 13 %   Monocytes Absolute 1.3 (H) 0.1 - 1.0 K/uL   Eosinophils Relative 0 %   Eosinophils Absolute 0.0 0.0 - 0.5 K/uL   Basophils Relative 0 %   Basophils Absolute 0.0 0.0 - 0.1 K/uL   RBC Morphology MORPHOLOGY UNREMARKABLE    Smear Review LARGE AND GIANT PLATELETS    Immature Granulocytes 25 %    Comment: Increased IG's, likely caused by Bone Marrow Colony Stimulating Factor received within 30 days.   Abs Immature Granulocytes 2.58 (H) 0.00 - 0.07 K/uL    Comment: Performed at [facility], [place] [address] [place], [place] [address]    ASSESSMENT and PLAN:  [name] is a 46 y.o. female who returns for a follow up for breast cancer.   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  --She says she couldn't tolerate [place] well at all, had intractable cough, sob and skin rash --Started neoadjuvant chemotherapy with TC on 02/15/2022. --Presents today for toxicity check. Labs from today were reviewed and require no intervention.  --Diffuse bone pain secondary to Udenyca injeciton. Advised to take Claritin daily.   #Upper respiratory congestion/cough: --Chest xray done today was negative for any infectious process --Okay to try OTC supportive medications including antihistamines and nasal sprays --Advised to monitor for fevers, chills, etc.   #H/O Stage IIC triple negative  invasive ductal carcinoma of the left breast: --See oncologic history above  Follow up: --RTC on 03/08/2022 for labs, f/u visit with Dr. Chryl Heck before Cycle 2 of TC chemotherapy.   Patient expressed understanding and satisfaction with the plan provided.   Thomas have spent a total of 30 minutes minutes of face-to-face and non-face-to-face time, preparing to see the patient, performing a medically appropriate examination, counseling and educating the patient, ordering tests, documenting clinical information in the electronic health record,  and care coordination.   Dede Query PA-C Dept of Hematology and [place] at [facility] Phone: [phone]

## 2022-02-22 NOTE — Progress Notes (Signed)
..  Patient is receiving Replacement Medication. Medication: Udenyca Manufacture: [employer] Approval Dates: Approved from 03/02/2022 until temporary pending Medicaid approval/denial. Reason: Self Pay First DOS: 02/15/2022

## 2022-02-24 ENCOUNTER — Telehealth: Payer: Self-pay

## 2022-02-24 ENCOUNTER — Encounter: Payer: Self-pay | Admitting: Hematology and Oncology

## 2022-02-24 NOTE — Telephone Encounter (Signed)
-----   Message from Lincoln Brigham, PA-C sent at 02/24/2022  2:36 PM EDT ----- Please notify patient that xray was negative. No sign of infection or other abnormality.

## 2022-02-24 NOTE — Telephone Encounter (Signed)
Pt advised and voiced understanding.   

## 2022-02-28 ENCOUNTER — Encounter: Payer: Self-pay | Admitting: *Deleted

## 2022-03-01 ENCOUNTER — Ambulatory Visit (INDEPENDENT_AMBULATORY_CARE_PROVIDER_SITE_OTHER): Payer: Self-pay | Admitting: Nurse Practitioner

## 2022-03-01 ENCOUNTER — Encounter: Payer: Self-pay | Admitting: Nurse Practitioner

## 2022-03-01 VITALS — BP 107/74 | HR 88 | Temp 97.8°F | Ht 60.0 in | Wt 190.2 lb

## 2022-03-01 DIAGNOSIS — K0889 Other specified disorders of teeth and supporting structures: Secondary | ICD-10-CM

## 2022-03-01 DIAGNOSIS — R051 Acute cough: Secondary | ICD-10-CM

## 2022-03-01 DIAGNOSIS — K047 Periapical abscess without sinus: Secondary | ICD-10-CM

## 2022-03-01 MED ORDER — CLINDAMYCIN HCL 300 MG PO CAPS: 300.0000 mg | ORAL_CAPSULE | Freq: Three times a day (TID) | ORAL | 0 refills | Status: AC

## 2022-03-01 MED ORDER — BENZONATATE 200 MG PO CAPS: 200.0000 mg | ORAL_CAPSULE | Freq: Two times a day (BID) | ORAL | 0 refills | Status: DC | PRN

## 2022-03-01 NOTE — Patient Instructions (Signed)
You were seen today in the [facility] for reevaluation of chronic illness. Labs were collected, results will be available via MyChart or, if abnormal, you will be contacted by clinic staff. You were prescribed medications, please take as directed. Please follow up in 3 mths for reevaluation of symptoms discussed and follow up on cancer treatment/ progress

## 2022-03-01 NOTE — Progress Notes (Signed)
[address], [place]  [address] Phone:  [phone]   Fax:  [phone] Subjective:   Patient ID: [name], female    DOB: Nov 25, 1975, 46 y.o.   MRN: [id number]  Chief Complaint  Patient presents with   Follow-up    Pt is here for 3 month's follow up visit. Pt stated she has a cough with green mucus  for about for 2 week's pt hasn't taken anything for the cough. Pt stated her tooth hurts the pain goes up to her nose.   HPI [name] 46 y.o. female  has a past medical history of Allergy, Asthma, Breast cancer in female [facility]), Breast disorder, Chronic back pain, History of breast cancer, History of COVID-19 (04/20/2021), migraines, Morbid obesity ([place]), Personal history of chemotherapy, and Personal history of radiation therapy. To the [facility] for 3 mth follow up, productive cough and dental pain.   States that she has had a productive cough x 2 wks with grey sputum. Has some chest pressure with cough, bur denies any shortness of breath. Has not taken any medications for symptoms. Denies any sick contacts.   Also concerned about dental pain in diffuse teeth and molars. States that symptoms began after starting chemotherapy. Informed oncologist of symptoms and was told it was related to "bone pain." Was not given any treatment or recommendations for pain. Has also noted a mass to upper gums x 4 days, between tooth 8 and 9. Endorses some pain, rates 8/10 and describes as pulsating. Has been taking tylenol and/ aleve for pain with mild to moderate relief.   Indicates that the breast cancer has returned, restarted chemotherapy earlier this month and next session on the 21st. States that cancer is more aggressive than previously. Was in remission for 1 yr prior to recurrence. Denies any other concerns today.   Denies any fatigue, chest pain, shortness of breath, HA or dizziness. Denies any blurred vision, numbness or  tingling.  Past Medical History:  Diagnosis Date   Allergy    Asthma    Breast cancer in female [facility])    Left   Breast disorder    breast cancer May 2021   Chronic back pain    History of breast cancer    History of COVID-19 04/20/2021   Hx of migraines    Morbid obesity ([place])    Personal history of chemotherapy    Personal history of radiation therapy     Past Surgical History:  Procedure Laterality Date   BREAST LUMPECTOMY     BREAST LUMPECTOMY WITH RADIOACTIVE SEED AND SENTINEL LYMPH NODE BIOPSY Left 07/15/2020   Procedure: LEFT BREAST LUMPECTOMY WITH RADIOACTIVE SEED AND LEFT AXILLARY SENTINEL LYMPH NODE BIOPSY, LEFT AXILLARY NODE RADIOACTIVE SEED GUIDED EXCISION, LEFT BREAST RADIOACTIVE SEED GUIDED EXCISION IM NODE;  Surgeon: Rolm Bookbinder, MD;  Location: [place];  Service: General;  Laterality: Left;  PEC BLOCK   CESAREAN SECTION WITH BILATERAL TUBAL LIGATION Bilateral 07/23/2015   Procedure: CESAREAN SECTION WITH BILATERAL TUBAL LIGATION;  Surgeon: Donnamae Jude, MD;  Location: [place] [facility];  Service: Obstetrics;  Laterality: Bilateral;   IR IMAGING GUIDED PORT INSERTION  02/11/2022   PORT-A-CATH REMOVAL N/A 04/30/2021   Procedure: REMOVAL PORT-A-CATH;  Surgeon: Rolm Bookbinder, MD;  Location: [place];  Service: General;  Laterality: N/A;   PORTACATH PLACEMENT Right 02/11/2020   Procedure: INSERTION PORT-A-CATH WITH ULTRASOUND GUIDANCE;  Surgeon: Rolm Bookbinder, MD;  Location:  [place];  Service: General;  Laterality: Right;   TUBAL LIGATION      Family History  Problem Relation Age of Onset   Heart disease Maternal Grandmother     Social History   Socioeconomic History   Marital status: Legally Separated    Spouse name: Not on file   Number of children: 3   Years of education: Not on file   Highest education level: 9th grade  Occupational History   Occupation: Architect  Tobacco Use   Smoking status: Never   Smokeless  tobacco: Never  Vaping Use   Vaping Use: Never used  Substance and Sexual Activity   Alcohol use: No   Drug use: Never   Sexual activity: Yes    Birth control/protection: Surgical  Other Topics Concern   Not on file  Social History Narrative   Not on file   Social Determinants of Health   Financial Resource Strain: Not on file  Food Insecurity: No Food Insecurity (02/23/2021)   Hunger Vital Sign    Worried About Running Out of Food in the Last Year: Never true    Ran Out of Food in the Last Year: Never true  Transportation Needs: No Transportation Needs (02/23/2021)   PRAPARE - Hydrologist (Medical): No    Lack of Transportation (Non-Medical): No  Physical Activity: Not on file  Stress: Not on file  Social Connections: Not on file  Intimate Partner Violence: Not on file    Outpatient Medications Prior to Visit  Medication Sig Dispense Refill   albuterol (VENTOLIN HFA) 108 (90 Base) MCG/ACT inhaler Inhale 1-2 puffs into the lungs every 4 (four) hours as needed for wheezing or shortness of breath. 8 g 5   montelukast (SINGULAIR) 10 MG tablet Take 1 tablet (10 mg total) by mouth at bedtime. 30 tablet 5   atorvastatin (LIPITOR) 40 MG tablet Take 1 tablet (40 mg total) by mouth daily. (Patient not taking: Reported on 03/01/2022) 30 tablet 5   cyclobenzaprine (FLEXERIL) 10 MG tablet Take 1 tablet (10 mg total) by mouth 3 (three) times daily as needed for muscle spasms. (Patient not taking: Reported on 02/22/2022) 30 tablet 0   dexamethasone (DECADRON) 4 MG tablet Take 2 tablets bid day before chemo none on chemo day and the restart day after chemo bid for 3 days (Patient not taking: Reported on 03/01/2022) 12 tablet 6   EPINEPHrine 0.3 mg/0.3 mL IJ SOAJ injection Inject 0.3 mg into the muscle as needed for anaphylaxis (Throat swelling/difficulty breathing). (Patient not taking: Reported on 03/01/2022) 1 each 2   fluticasone (FLONASE) 50 MCG/ACT nasal spray Place  1 spray into both nostrils 2 (two) times daily as needed (nasal congestion). (Patient not taking: Reported on 03/01/2022) 16 g 5   Olopatadine HCl 0.2 % SOLN Apply 1 drop to eye daily as needed (itchy/watery eyes). (Patient not taking: Reported on 03/01/2022) 2.5 mL 5   omeprazole (PRILOSEC OTC) 20 MG tablet Take 1 tablet (20 mg total) by mouth daily. (Patient not taking: Reported on 02/22/2022) 30 tablet 1   ondansetron (ZOFRAN) 8 MG tablet May use 3 days after chemo if needed for nausea (Patient not taking: Reported on 03/01/2022) 20 tablet 3   prochlorperazine (COMPAZINE) 10 MG tablet Take 1 tablet (10 mg total) by mouth every 8 (eight) hours as needed for nausea or vomiting. (Patient not taking: Reported on 03/01/2022) 30 tablet 0   traMADol (ULTRAM) 50 MG tablet Take  1 tablet (50 mg total) by mouth every 8 (eight) hours as needed. (Patient not taking: Reported on 03/01/2022) 30 tablet 0   No facility-administered medications prior to visit.    Allergies  Allergen Reactions   Omnipaque [Iohexol] Other (See Comments)    Throat itching, some SOB and some chest pain post administration    Doxorubicin Hcl Rash    Experience chest tightness, abdominal pain, and redness around lips during and shortly after doxorubicin bolus.    Keytruda [Pembrolizumab] Other (See Comments)    Lung irritation; skin breakout; swelling   Other Itching    Almonds, cherries, peaches   Tape     Review of Systems  Constitutional:  Negative for chills, fever and malaise/fatigue.  HENT:  Negative for congestion, ear discharge, ear pain, hearing loss, nosebleeds, sinus pain, sore throat and tinnitus.        See HPI  Eyes: Negative.   Respiratory:  Positive for cough and sputum production. Negative for shortness of breath and stridor.   Cardiovascular:  Positive for chest pain. Negative for palpitations and leg swelling.       See HPI  Gastrointestinal:  Negative for abdominal pain, blood in stool, constipation,  diarrhea, nausea and vomiting.  Skin: Negative.   Neurological: Negative.   Psychiatric/Behavioral:  Negative for depression. The patient is not nervous/anxious.   All other systems reviewed and are negative.      Objective:    Physical Exam Vitals reviewed.  Constitutional:      General: She is not in acute distress.    Appearance: Normal appearance. She is normal weight.  HENT:     Head: Normocephalic.     Mouth/Throat:     Mouth: Mucous membranes are moist.     Dentition: Dental tenderness and gingival swelling present.     Pharynx: Oropharynx is clear.  Cardiovascular:     Rate and Rhythm: Normal rate and regular rhythm.     Pulses: Normal pulses.     Heart sounds: Normal heart sounds.     Comments: No obvious peripheral edema Pulmonary:     Effort: Pulmonary effort is normal.     Breath sounds: Normal breath sounds.  Musculoskeletal:        General: No swelling, tenderness, deformity or signs of injury. Normal range of motion.     Right lower leg: No edema.     Left lower leg: No edema.  Skin:    General: Skin is warm and dry.     Capillary Refill: Capillary refill takes less than 2 seconds.  Neurological:     General: No focal deficit present.     Mental Status: She is alert and oriented to person, place, and time.  Psychiatric:        Mood and Affect: Mood normal.        Behavior: Behavior normal.        Thought Content: Thought content normal.        Judgment: Judgment normal.     BP 107/74 (BP Location: Right Arm, Patient Position: Sitting, Cuff Size: Large)   Pulse 88   Temp 97.8 F (36.6 C)   Ht 5' (1.524 m)   Wt 190 lb 3.2 oz (86.3 kg)   LMP 01/11/2019 (Exact Date)   SpO2 97%   BMI 37.15 kg/m  Wt Readings from Last 3 Encounters:  03/01/22 190 lb 3.2 oz (86.3 kg)  02/22/22 191 lb 4.8 oz (86.8 kg)  02/15/22 190 lb (86.2 kg)  Immunization History  Administered Date(s) Administered   Influenza,inj,Quad PF,6+ Mos 11/01/2016   Janssen (J&J)  SARS-COV-2 Vaccination 01/31/2020   PFIZER(Purple Top)SARS-COV-2 Vaccination 08/19/2020   Unspecified SARS-COV-2 Vaccination 11/25/2020    Diabetic Foot Exam - Simple   No data filed     Lab Results  Component Value Date   TSH 3.960 10/01/2021   Lab Results  Component Value Date   WBC 10.2 02/22/2022   HGB 12.1 02/22/2022   HCT 35.6 (L) 02/22/2022   MCV 84.8 02/22/2022   PLT 133 (L) 02/22/2022   Lab Results  Component Value Date   NA 140 02/22/2022   K 4.3 02/22/2022   CO2 29 02/22/2022   GLUCOSE 103 (H) 02/22/2022   BUN 12 02/22/2022   CREATININE 0.70 02/22/2022   BILITOT 0.3 02/22/2022   ALKPHOS 89 02/22/2022   AST 20 02/22/2022   ALT 17 02/22/2022   PROT 6.6 02/22/2022   ALBUMIN 3.8 02/22/2022   CALCIUM 9.3 02/22/2022   ANIONGAP 5 02/22/2022   EGFR 103 10/01/2021   Lab Results  Component Value Date   CHOL 253 (H) 10/01/2021   Lab Results  Component Value Date   HDL 47 10/01/2021   Lab Results  Component Value Date   LDLCALC 176 (H) 10/01/2021   Lab Results  Component Value Date   TRIG 165 (H) 10/01/2021   Lab Results  Component Value Date   CHOLHDL 5.4 (H) 10/01/2021   Lab Results  Component Value Date   HGBA1C 5.6 11/29/2021   HGBA1C 5.6 10/01/2021       Assessment & Plan:   Problem List Items Addressed This Visit   None Visit Diagnoses     Acute cough    -  Primary   Relevant Medications   benzonatate (TESSALON) 200 MG capsule, initiated during visit    Other Relevant Orders   DG Chest 2 View Discussed non pharmacological methods for management of symptoms Informed to take OTC medications as needed    Pain, dental     Discussed non pharmacological methods for management of symptoms Informed to take OTC medications as needed Encouraged to complete follow up with dentistry  Encouraged to notify oncologist of symptoms   Dental infection       Relevant Medications   clindamycin (CLEOCIN) 300 MG capsule, initiated during visit   Discussed non pharmacological methods for management of symptoms Informed to take OTC medications as needed Informed to follow up with dentistry within 1-2 wks for further evaluation and management  Symptoms likely related to chemotherapy    Follow up in 3 mths for reevaluation of symptoms, sooner as needed     I am having [place] start on benzonatate and clindamycin. I am also having her maintain her albuterol, atorvastatin, cyclobenzaprine, EPINEPHrine, traMADol, montelukast, fluticasone, Olopatadine HCl, omeprazole, ondansetron, dexamethasone, and prochlorperazine.  Meds ordered this encounter  Medications   benzonatate (TESSALON) 200 MG capsule    Sig: Take 1 capsule (200 mg total) by mouth 2 (two) times daily as needed for cough.    Dispense:  20 capsule    Refill:  0   clindamycin (CLEOCIN) 300 MG capsule    Sig: Take 1 capsule (300 mg total) by mouth 3 (three) times daily for 7 days.    Dispense:  21 capsule    Refill:  0     Teena Dunk, NP

## 2022-03-03 ENCOUNTER — Ambulatory Visit: Payer: Self-pay

## 2022-03-07 ENCOUNTER — Other Ambulatory Visit: Payer: Self-pay | Admitting: *Deleted

## 2022-03-07 DIAGNOSIS — C50412 Malignant neoplasm of upper-outer quadrant of left female breast: Secondary | ICD-10-CM

## 2022-03-08 ENCOUNTER — Inpatient Hospital Stay (HOSPITAL_BASED_OUTPATIENT_CLINIC_OR_DEPARTMENT_OTHER): Payer: No Typology Code available for payment source | Admitting: Hematology and Oncology

## 2022-03-08 ENCOUNTER — Encounter: Payer: Self-pay | Admitting: Hematology and Oncology

## 2022-03-08 ENCOUNTER — Inpatient Hospital Stay: Payer: No Typology Code available for payment source

## 2022-03-08 ENCOUNTER — Other Ambulatory Visit: Payer: Self-pay

## 2022-03-08 DIAGNOSIS — K0889 Other specified disorders of teeth and supporting structures: Secondary | ICD-10-CM

## 2022-03-08 DIAGNOSIS — Z170 Estrogen receptor positive status [ER+]: Secondary | ICD-10-CM

## 2022-03-08 DIAGNOSIS — C50412 Malignant neoplasm of upper-outer quadrant of left female breast: Secondary | ICD-10-CM

## 2022-03-08 DIAGNOSIS — N644 Mastodynia: Secondary | ICD-10-CM

## 2022-03-08 DIAGNOSIS — G893 Neoplasm related pain (acute) (chronic): Secondary | ICD-10-CM

## 2022-03-08 DIAGNOSIS — Z95828 Presence of other vascular implants and grafts: Secondary | ICD-10-CM

## 2022-03-08 LAB — CBC WITH DIFFERENTIAL (CANCER CENTER ONLY)
Abs Immature Granulocytes: 0.01 10*3/uL (ref 0.00–0.07)
Basophils Absolute: 0 10*3/uL (ref 0.0–0.1)
Basophils Relative: 1 %
Eosinophils Absolute: 0 10*3/uL (ref 0.0–0.5)
Eosinophils Relative: 0 %
HCT: 32.5 % — ABNORMAL LOW (ref 36.0–46.0)
Hemoglobin: 10.8 g/dL — ABNORMAL LOW (ref 12.0–15.0)
Immature Granulocytes: 0 %
Lymphocytes Relative: 24 %
Lymphs Abs: 0.9 10*3/uL (ref 0.7–4.0)
MCH: 28 pg (ref 26.0–34.0)
MCHC: 33.2 g/dL (ref 30.0–36.0)
MCV: 84.2 fL (ref 80.0–100.0)
Monocytes Absolute: 0.3 10*3/uL (ref 0.1–1.0)
Monocytes Relative: 7 %
Neutro Abs: 2.7 10*3/uL (ref 1.7–7.7)
Neutrophils Relative %: 68 %
Platelet Count: 259 10*3/uL (ref 150–400)
RBC: 3.86 MIL/uL — ABNORMAL LOW (ref 3.87–5.11)
RDW: 13.8 % (ref 11.5–15.5)
WBC Count: 4 10*3/uL (ref 4.0–10.5)
nRBC: 0 % (ref 0.0–0.2)

## 2022-03-08 LAB — CMP (CANCER CENTER ONLY)
ALT: 11 U/L (ref 0–44)
AST: 13 U/L — ABNORMAL LOW (ref 15–41)
Albumin: 3.8 g/dL (ref 3.5–5.0)
Alkaline Phosphatase: 79 U/L (ref 38–126)
Anion gap: 5 (ref 5–15)
BUN: 11 mg/dL (ref 6–20)
CO2: 27 mmol/L (ref 22–32)
Calcium: 9.2 mg/dL (ref 8.9–10.3)
Chloride: 107 mmol/L (ref 98–111)
Creatinine: 0.66 mg/dL (ref 0.44–1.00)
GFR, Estimated: >60 mL/min (ref >60)
Glucose, Bld: 99 mg/dL (ref 70–99)
Potassium: 3.9 mmol/L (ref 3.5–5.1)
Sodium: 139 mmol/L (ref 135–145)
Total Bilirubin: 0.4 mg/dL (ref 0.3–1.2)
Total Protein: 6.5 g/dL (ref 6.5–8.1)

## 2022-03-08 MED ORDER — HEPARIN SOD (PORK) LOCK FLUSH 100 UNIT/ML IV SOLN
500.0000 [IU] | Freq: Once | INTRAVENOUS | Status: AC | PRN
  Administered 2022-03-08: 500 [IU]

## 2022-03-08 MED ORDER — SODIUM CHLORIDE 0.9% FLUSH
10.0000 mL | Freq: Once | INTRAVENOUS | Status: AC
  Administered 2022-03-08: 10 mL

## 2022-03-08 MED ORDER — SODIUM CHLORIDE 0.9 % IV SOLN
600.0000 mg/m2 | Freq: Once | INTRAVENOUS | Status: AC
  Administered 2022-03-08: 1140 mg via INTRAVENOUS
  Filled 2022-03-08: qty 57

## 2022-03-08 MED ORDER — SODIUM CHLORIDE 0.9% FLUSH
10.0000 mL | INTRAVENOUS | Status: DC | PRN
  Administered 2022-03-08: 10 mL

## 2022-03-08 MED ORDER — SODIUM CHLORIDE 0.9 % IV SOLN
75.0000 mg/m2 | Freq: Once | INTRAVENOUS | Status: AC
  Administered 2022-03-08: 140 mg via INTRAVENOUS
  Filled 2022-03-08: qty 14

## 2022-03-08 MED ORDER — AMOXICILLIN-POT CLAVULANATE 875-125 MG PO TABS: 1.0000 | ORAL_TABLET | Freq: Two times a day (BID) | ORAL | 0 refills | Status: DC

## 2022-03-08 MED ORDER — PALONOSETRON HCL INJECTION 0.25 MG/5ML
0.2500 mg | Freq: Once | INTRAVENOUS | Status: AC
  Administered 2022-03-08: 0.25 mg via INTRAVENOUS
  Filled 2022-03-08: qty 5

## 2022-03-08 MED ORDER — SODIUM CHLORIDE 0.9 % IV SOLN: Freq: Once | INTRAVENOUS | Status: AC

## 2022-03-08 MED ORDER — SODIUM CHLORIDE 0.9 % IV SOLN
10.0000 mg | Freq: Once | INTRAVENOUS | Status: AC
  Administered 2022-03-08: 10 mg via INTRAVENOUS
  Filled 2022-03-08: qty 10

## 2022-03-08 NOTE — Assessment & Plan Note (Signed)
Severe left arm pain. Using tramadol once a day. Will continue to monitor.

## 2022-03-08 NOTE — Progress Notes (Signed)
[facility] Cancer Follow up:    [name] I, NP 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             (a) chest CT scan and bone scan 02/07/2020 showed no evidence of metastatic disease   (1) neoadjuvant chemotherapy consisting of doxorubicin and cyclophosphamide in dose dense fashion x4 started 02/12/2020, completed 03/25/2020, followed by paclitaxel and carboplatin weekly x12 started 04/08/2020 and completed on 05/28/2020             (a) echo 02/07/2020 shows an ejection fraction in the 55-60% range             (b) Epirubicin substituted for Doxorubicin starting with cycle 2 of AC secondary to allergic rash from Doxorubicin   (2) status post left lumpectomy and sentinel lymph node sampling 07/15/2020 for a ypT1b ypN0 residual invasive ductal carcinoma, grade 3, with negative margins.             (a) a total of 4 left axillary lymph nodes were removed             (b) repeat prognostic panel on the final pathology again triple negative, with an MIB-1 of 50%.   (3) adjuvant radiation completed 10/09/2020             (a) sensitizing capecitabine attempted but not tolerated by the patient   (4) genetics testing 02/07/2020 through the [facility] Common Hereditary Cancers Panel found no deleterious mutations in  APC, ATM, AXIN2, BARD1, BMPR1A, BRCA1, BRCA2, BRIP1, CDH1, CDKN2A (p14ARF), CDKN2A (p16INK4a), CKD4,  CHEK2, CTNNA1, DICER1, EPCAM (Deletion/duplication testing only), GREM1 (promoter region deletion/duplication testing only), KIT, MEN1, MLH1, MSH2, MSH3, MSH6, MUTYH, NBN, NF1, NHTL1, PALB2, PDGFRA, PMS2, POLD1, POLE, PTEN, RAD50, RAD51C, RAD51D, RNF43, SDHB, SDHC, SDHD, SMAD4, SMARCA4. STK11, TP53, TSC1, TSC2, and VHL.  The following genes were evaluated for sequence changes only: SDHA and HOXB13 c.251G>A variant only.             (a) VUS in MSH6 called c.2744C>G identified.    (5)  adjuvant pembrolizumab started 11/23/2020, to continue for 1 year             (a) discontinued after 01/04/2021 dose with multiple side effects requiring steroids for resolution  CURRENT THERAPY: Observation  INTERVAL HISTORY: [name] 46 y.o. female currently on chemotherapy who is here for follow up. 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 CT imaging as well as MRI brain imaging without any evidence of metastatic breast cancer. Pathology sent to [facility] for second opinion which confirmed triple neg breast cancer.  She says she felt tired, diffuse pain, she complains of flank pain when she urinated in the first week which has since then resolved. She says the breast is the same, no change. She felt some pain in the hips as if they are squeezing together. No fevers, has some occasional chills. No nausea  or vomiting, has some semi formed stools a day, about 3 times a day. Rest of the pertinent 10 point ROS reviewed and negative   Patient Active Problem List   Diagnosis Date Noted   Pain, dental 03/08/2022   Cancer-related pain 03/08/2022   Port-A-Cath in place 02/15/2022   Oral allergy syndrome, subsequent encounter 02/03/2022   Adverse effect of other drugs, medicaments and biological substances, subsequent encounter 02/02/2022   Other adverse food reactions, not elsewhere classified, subsequent encounter 02/02/2022   Chest tightness 02/02/2022    Other allergic rhinitis 02/02/2022   Allergic conjunctivitis of both eyes 02/02/2022   Language barrier 12/11/2020   Morbid obesity (HCC)    History of breast cancer    Genetic testing 02/13/2020   Malignant neoplasm of upper-outer quadrant of left breast in female, estrogen receptor positive (HCC) 01/27/2020   Impaired ability to use community resources due to language barrier 07/13/2013    is allergic to omnipaque [iohexol], doxorubicin hcl, keytruda [pembrolizumab], other, and tape.  MEDICAL HISTORY: Past Medical History:  Diagnosis Date   Allergy    Asthma    Breast cancer in female [facility])    Left   Breast disorder    breast cancer May 2021   Chronic back pain    History of breast cancer    History of COVID-19 04/20/2021   Hx of migraines    Morbid obesity (HCC)    Personal history of chemotherapy    Personal history of radiation therapy     SURGICAL HISTORY: Past Surgical History:  Procedure Laterality Date   BREAST LUMPECTOMY     BREAST LUMPECTOMY WITH RADIOACTIVE SEED AND SENTINEL LYMPH NODE BIOPSY Left 07/15/2020   Procedure: LEFT BREAST LUMPECTOMY WITH RADIOACTIVE SEED AND LEFT AXILLARY SENTINEL LYMPH NODE BIOPSY, LEFT AXILLARY NODE RADIOACTIVE SEED GUIDED EXCISION, LEFT BREAST RADIOACTIVE SEED GUIDED EXCISION IM NODE;  Surgeon: Emelia Loron, MD;  Location: [facility];  Service: General;  Laterality: Left;  PEC BLOCK   CESAREAN SECTION WITH BILATERAL TUBAL LIGATION Bilateral 07/23/2015   Procedure: CESAREAN SECTION WITH BILATERAL TUBAL LIGATION;  Surgeon: Reva Bores, MD;  Location: [facility];  Service: Obstetrics;  Laterality: Bilateral;   IR IMAGING GUIDED PORT INSERTION  02/11/2022   PORT-A-CATH REMOVAL N/A 04/30/2021   Procedure: REMOVAL PORT-A-CATH;  Surgeon: Emelia Loron, MD;  Location: [facility];  Service: General;  Laterality: N/A;   PORTACATH PLACEMENT Right 02/11/2020   Procedure: INSERTION PORT-A-CATH WITH ULTRASOUND GUIDANCE;   Surgeon: Emelia Loron, MD;  Location: [facility];  Service: General;  Laterality: Right;   TUBAL LIGATION      SOCIAL HISTORY: Social History   Socioeconomic History   Marital status: Legally Separated    Spouse name: Not on file   Number of children: 3   Years of education: Not on file   Highest education level: 9th grade  Occupational History   Occupation: Holiday representative  Tobacco Use   Smoking status: Never   Smokeless tobacco: Never  Vaping Use   Vaping Use: Never used  Substance and Sexual Activity   Alcohol use: No   Drug use: Never   Sexual activity: Yes    Birth control/protection: Surgical  Other Topics Concern   Not on file  Social History Narrative   Not on file   Social Determinants of Health   Financial Resource Strain: Not on file  Food Insecurity: No Food Insecurity (02/23/2021)   Hunger Vital Sign    Worried About Running  Out of Food in the Last Year: Never true    Ran Out of Food in the Last Year: Never true  Transportation Needs: No Transportation Needs (02/23/2021)   PRAPARE - Administrator, Civil Service (Medical): No    Lack of Transportation (Non-Medical): No  Physical Activity: Not on file  Stress: Not on file  Social Connections: Not on file  Intimate Partner Violence: Not on file    FAMILY HISTORY: Family History  Problem Relation Age of Onset   Heart disease Maternal Grandmother      PHYSICAL EXAMINATION  ECOG PERFORMANCE STATUS: 1 - Symptomatic but completely ambulatory  Vitals:   03/08/22 0834  BP: 124/77  Pulse: 84  Resp: 18  Temp: (!) 97.2 F (36.2 C)  SpO2: 98%    Physical Exam Constitutional:      General: She is not in acute distress.    Appearance: Normal appearance. She is not toxic-appearing.  HENT:     Head: Normocephalic and atraumatic.  Eyes:     General: No scleral icterus. Cardiovascular:     Rate and Rhythm: Normal rate and regular rhythm.     Pulses: Normal pulses.      Heart sounds: Normal heart sounds.  Pulmonary:     Effort: Pulmonary effort is normal.     Breath sounds: Normal breath sounds.  Chest:       Comments: Erythema of the left breast appears to be the same since last visit. Erythema in the back appears improved. Abdominal:     General: Abdomen is flat. Bowel sounds are normal. There is no distension.     Palpations: Abdomen is soft.     Tenderness: There is no abdominal tenderness.  Musculoskeletal:        General: No swelling.     Cervical back: Neck supple.  Lymphadenopathy:     Cervical: No cervical adenopathy.     Upper Body:     Left upper body: No pectoral adenopathy.  Skin:    General: Skin is warm and dry.     Findings: No rash.  Neurological:     General: No focal deficit present.     Mental Status: She is alert.  Psychiatric:        Mood and Affect: Mood normal.        Behavior: Behavior normal.     LABORATORY DATA:  None for this visit  Pictures in media   ASSESSMENT and PLAN:   Malignant neoplasm of upper-outer quadrant of left breast in female, estrogen receptor positive (HCC) [name] is a 46 year old Spanish-speaking woman who has a 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  She was most recently seen for ongoing breast redness, given a course of antibiotics but had persistent redness hence we proceeded with a punch biopsy.  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  The tissue sample was thought to be scant. Pathology was sent to [facility] for second opinion as well. This confirmed triple negative breast cancer.  We have recommended neoadjuvant chemotherapy with TC every 21 days for 4 cycles followed by adjuvant xeloda. She says she couldn't tolerate [place] well at all, had intractable cough, sob and skin rash. She however doesn't remember much about xeloda adverse effects She is here for follow up  before C2 of chemotherapy.  ROS pertinent for fatigue, some bone pains, flank pains, some semiformed stools PE unremarkable, some  improvement in redness of the left breast, significant improvement in the back She will have to proceed with mastectomy after neoadjuvant chemotherapy. OK to proceed with chemo as planned if labs are within parameters  Pain, dental Severe dental pain, lower left. The tooth appears to be infected She needs urgent dental consultation, but she is having difficulty finding a dentist I will start her on augmentin/clavulanate BID and refer her to Dr Chales Salmon urgently  Cancer-related pain Severe left arm pain. Using tramadol once a day. Will continue to monitor.   Total encounter time:30 minutes*in face-to-face visit time, chart review, lab review, care coordination, order entry, and documentation of the encounter time.  *Total Encounter Time as defined by the Centers for Medicare and Medicaid Services includes, in addition to the face-to-face time of a patient visit (documented in the note above) non-face-to-face time: obtaining and reviewing outside history, ordering and reviewing medications, tests or procedures, care coordination (communications with other health care professionals or caregivers) and documentation in the medical record.

## 2022-03-10 ENCOUNTER — Ambulatory Visit: Payer: Self-pay

## 2022-03-10 ENCOUNTER — Inpatient Hospital Stay: Payer: No Typology Code available for payment source

## 2022-03-10 ENCOUNTER — Ambulatory Visit (INDEPENDENT_AMBULATORY_CARE_PROVIDER_SITE_OTHER): Payer: Self-pay | Admitting: Dentistry

## 2022-03-10 ENCOUNTER — Encounter (HOSPITAL_COMMUNITY): Payer: Self-pay | Admitting: Dentistry

## 2022-03-10 ENCOUNTER — Other Ambulatory Visit: Payer: Self-pay

## 2022-03-10 VITALS — BP 111/59 | HR 81 | Temp 98.4°F | Resp 18

## 2022-03-10 VITALS — BP 120/78 | HR 80 | Temp 99.5°F

## 2022-03-10 DIAGNOSIS — K0850 Unsatisfactory restoration of tooth, unspecified: Secondary | ICD-10-CM

## 2022-03-10 DIAGNOSIS — K044 Acute apical periodontitis of pulpal origin: Secondary | ICD-10-CM

## 2022-03-10 DIAGNOSIS — K06020 Generalized gingival recession, unspecified: Secondary | ICD-10-CM

## 2022-03-10 DIAGNOSIS — Z0120 Encounter for dental examination and cleaning without abnormal findings: Secondary | ICD-10-CM

## 2022-03-10 DIAGNOSIS — K08109 Complete loss of teeth, unspecified cause, unspecified class: Secondary | ICD-10-CM

## 2022-03-10 DIAGNOSIS — F40232 Fear of other medical care: Secondary | ICD-10-CM

## 2022-03-10 DIAGNOSIS — K0530 Chronic periodontitis, unspecified: Secondary | ICD-10-CM

## 2022-03-10 DIAGNOSIS — Z170 Estrogen receptor positive status [ER+]: Secondary | ICD-10-CM

## 2022-03-10 DIAGNOSIS — C50412 Malignant neoplasm of upper-outer quadrant of left female breast: Secondary | ICD-10-CM

## 2022-03-10 DIAGNOSIS — K029 Dental caries, unspecified: Secondary | ICD-10-CM

## 2022-03-10 DIAGNOSIS — K036 Deposits [accretions] on teeth: Secondary | ICD-10-CM

## 2022-03-10 MED ORDER — PEGFILGRASTIM-CBQV 6 MG/0.6ML ~~LOC~~ SOSY
6.0000 mg | PREFILLED_SYRINGE | Freq: Once | SUBCUTANEOUS | Status: AC
  Administered 2022-03-10: 6 mg via SUBCUTANEOUS
  Filled 2022-03-10: qty 0.6

## 2022-03-10 NOTE — Progress Notes (Signed)
Department of Dental Medicine    Service Date:   03/10/2022  Patient Name:  [name] Date of Birth:   1975-12-25 Medical Record Number: [id number]  Referring Provider:           Benay Pike, M.D.  TODAY'S VISIT LIMITED EXAM   ASSESSMENT: There are no current signs of acute odontogenic infection including abscess, edema or erythema, or suspicious lesion requiring biopsy.   Large decay tooth #18 that has invaded the pulp  RECOMMENDATIONS: Extraction of tooth #18 to decrease the risk of perioperative & postoperative systemic infection and complications.   Referral to oral surgeon due to patient's severe dental phobia & wish for IV sedation. Establish dental care at an outside office of the patient's choice for routine care including cleanings, replacement of missing teeth as needed & exams.  PLAN: Discuss case with medical team & coordinate treatment as needed. Refer to [facility] for extraction. Will place urgent referral. The patient is aware that their office will be calling her to schedule an appointment once referral is received. Call if any questions or concerns arise.  Follow-up as needed in the hospital dental clinic.  Discussed in detail all treatment options and recommendations with the patient and they are agreeable to the plan.    Thank you for consulting with Hospital Dent[facility] and for the opportunity to participate in this patient's treatment.  Should you have any questions or concerns, please contact the [facility] at [phone].   03/10/2022 A spanish interpreter (in-person) was used throughout the entire duration of the appointment.   HISTORY OF PRESENT ILLNESS: [name] is a very pleasant 46 y.o. female with history of asthma, obesity, chronic back pain, migraines and breast cancer (malignant neoplasm of upper-outer quadrant of left breast, estrogen receptor positive) with history of both chemotherapy and  radiation who presents today for a limited exam to evaluate new onset of tooth pain.  The patient was referred by Dr. Chryl Heck since the patient is currently undergoing chemotherapy treatments that need to continue without interruption due to the type of breast cancer being treated.  Per Dr. 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  She was most recently seen for ongoing breast redness, given a course of antibiotics but had persistent redness hence we proceeded with a punch biopsy.  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  The tissue sample was thought to be scant. Pathology was sent to [facility] for second opinion as well. This confirmed triple negative breast cancer.  We have recommended neoadjuvant chemotherapy with TC every 21 days for 4 cycles followed by adjuvant xeloda. She says she couldn't tolerate [name] and [place] well at all, had intractable cough, sob and skin rash. She however doesn't remember much about xeloda adverse effects She is here for follow up before C2 of chemotherapy.  ROS pertinent for fatigue, some bone pains, flank pains, some semiformed stools PE unremarkable, some improvement in redness of the left breast, significant improvement in the back She will have to proceed with mastectomy after neoadjuvant chemotherapy. OK to proceed with chemo as planned if labs are within parameters   Pain, dental Severe dental pain, lower left. The tooth appears to be infected She needs urgent dental  consultation, but she is having difficulty finding a dentist I will start her on augmentin/clavulanate BID and refer her to Dr Benson [place] urgently   Cancer-related pain Severe left arm pain. Using tramadol  once a day. 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 She says that after all of her chemotherapy treatments for breast cancer, she has noticed her teeth have started to break off. Patient is able to manage oral secretions.  Patient denies dysphagia, odynophagia, dysphonia, SOB and neck pain.  Patient denies fever, rigors and malaise.   CHIEF COMPLAINT:  She reports that recently she removed 2 bridges, one on the lower right and the other on the lower left, because they were very loose and the one on the lower left had started to hurt. She says that when the tooth first started to hurt (she points to tooth #18), she felt sharp pain that radiated down her neck and jaw bone with some minor swelling.  She cannot eat or chew on the left side or avoids doing this due to pain.  She was started on Augmentin when she mentioned this to her oncologist and still has 3-4 days left until she finishes the course.  She says that she notices after she finishes chemotherapy, the pain goes away for a while but does return later.  She denies any issues with trismus, swallowing or breathing.   Patient Active Problem List   Diagnosis Date Noted   Pain, dental 03/08/2022   Cancer-related pain 03/08/2022   Port-A-Cath in place 02/15/2022   Oral allergy syndrome, subsequent encounter 02/03/2022   Adverse effect of other drugs, medicaments and biological substances, subsequent encounter 02/02/2022   Other adverse food reactions, not elsewhere classified, subsequent encounter 02/02/2022   Chest tightness 02/02/2022   Other allergic rhinitis 02/02/2022   Allergic conjunctivitis of both eyes 02/02/2022   Language barrier 12/11/2020   Morbid obesity ([place])    History of breast cancer    Genetic testing 02/13/2020   Malignant neoplasm of  upper-outer quadrant of left breast in female, estrogen receptor positive ([place]) 01/27/2020   Impaired ability to use community resources due to language barrier 07/13/2013   Past Medical History:  Diagnosis Date   Allergy    Asthma    Breast cancer in female [facility])    Left   Breast disorder    breast cancer May 2021   Chronic back pain    History of breast cancer    History of COVID-19 04/20/2021   Hx of migraines    Morbid obesity ([place])    Personal history of chemotherapy    Personal history of radiation therapy    Past Surgical History:  Procedure Laterality Date   BREAST LUMPECTOMY     BREAST LUMPECTOMY WITH RADIOACTIVE SEED AND SENTINEL LYMPH NODE BIOPSY Left 07/15/2020   Procedure: LEFT BREAST LUMPECTOMY WITH RADIOACTIVE SEED AND LEFT AXILLARY SENTINEL LYMPH NODE BIOPSY, LEFT AXILLARY NODE RADIOACTIVE SEED GUIDED EXCISION, LEFT BREAST RADIOACTIVE SEED GUIDED EXCISION IM NODE;  Surgeon: Rolm Bookbinder, MD;  Location: [place];  Service: General;  Laterality: Left;  PEC BLOCK   CESAREAN SECTION WITH BILATERAL TUBAL LIGATION Bilateral 07/23/2015   Procedure: CESAREAN SECTION WITH BILATERAL TUBAL LIGATION;  Surgeon: Donnamae Jude, MD;  Location: [facility];  Service: Obstetrics;  Laterality: Bilateral;  IR IMAGING GUIDED PORT INSERTION  02/11/2022   PORT-A-CATH REMOVAL N/A 04/30/2021   Procedure: REMOVAL PORT-A-CATH;  Surgeon: Rolm Bookbinder, MD;  Location: [place];  Service: General;  Laterality: N/A;   PORTACATH PLACEMENT Right 02/11/2020   Procedure: INSERTION PORT-A-CATH WITH ULTRASOUND GUIDANCE;  Surgeon: Rolm Bookbinder, MD;  Location: [place];  Service: General;  Laterality: Right;   TUBAL LIGATION     Allergies  Allergen Reactions   Omnipaque [Iohexol] Other (See Comments)    Throat itching, some SOB and some chest pain post administration    Doxorubicin Hcl Rash    Experience chest tightness, abdominal pain, and redness around lips  during and shortly after doxorubicin bolus.    Keytruda [Pembrolizumab] Other (See Comments)    Lung irritation; skin breakout; swelling   Other Itching    Almonds, cherries, peaches   Tape    Current Outpatient Medications  Medication Sig Dispense Refill   albuterol (VENTOLIN HFA) 108 (90 Base) MCG/ACT inhaler Inhale 1-2 puffs into the lungs every 4 (four) hours as needed for wheezing or shortness of breath. 8 g 5   amoxicillin-clavulanate (AUGMENTIN) 875-125 MG tablet Take 1 tablet by mouth 2 (two) times daily. 14 tablet 0   atorvastatin (LIPITOR) 40 MG tablet Take 1 tablet (40 mg total) by mouth daily. (Patient not taking: Reported on 03/01/2022) 30 tablet 5   benzonatate (TESSALON) 200 MG capsule Take 1 capsule (200 mg total) by mouth 2 (two) times daily as needed for cough. 20 capsule 0   cyclobenzaprine (FLEXERIL) 10 MG tablet Take 1 tablet (10 mg total) by mouth 3 (three) times daily as needed for muscle spasms. (Patient not taking: Reported on 02/22/2022) 30 tablet 0   dexamethasone (DECADRON) 4 MG tablet Take 2 tablets bid day before chemo none on chemo day and the restart day after chemo bid for 3 days (Patient not taking: Reported on 03/01/2022) 12 tablet 6   EPINEPHrine 0.3 mg/0.3 mL IJ SOAJ injection Inject 0.3 mg into the muscle as needed for anaphylaxis (Throat swelling/difficulty breathing). (Patient not taking: Reported on 03/01/2022) 1 each 2   fluticasone (FLONASE) 50 MCG/ACT nasal spray Place 1 spray into both nostrils 2 (two) times daily as needed (nasal congestion). (Patient not taking: Reported on 03/01/2022) 16 g 5   montelukast (SINGULAIR) 10 MG tablet Take 1 tablet (10 mg total) by mouth at bedtime. 30 tablet 5   Olopatadine HCl 0.2 % SOLN Apply 1 drop to eye daily as needed (itchy/watery eyes). (Patient not taking: Reported on 03/01/2022) 2.5 mL 5   omeprazole (PRILOSEC OTC) 20 MG tablet Take 1 tablet (20 mg total) by mouth daily. (Patient not taking: Reported on 02/22/2022) 30  tablet 1   ondansetron (ZOFRAN) 8 MG tablet May use 3 days after chemo if needed for nausea (Patient not taking: Reported on 03/01/2022) 20 tablet 3   prochlorperazine (COMPAZINE) 10 MG tablet Take 1 tablet (10 mg total) by mouth every 8 (eight) hours as needed for nausea or vomiting. (Patient not taking: Reported on 03/01/2022) 30 tablet 0   traMADol (ULTRAM) 50 MG tablet Take 1 tablet (50 mg total) by mouth every 8 (eight) hours as needed. (Patient not taking: Reported on 03/01/2022) 30 tablet 0   No current facility-administered medications for this visit.    LABS: Lab Results  Component Value Date   WBC 4.0 03/08/2022   HGB 10.8 (L) 03/08/2022   HCT 32.5 (L) 03/08/2022   MCV 84.2 03/08/2022  PLT 259 03/08/2022      Component Value Date/Time   NA 139 03/08/2022 0820   NA 141 10/01/2021 1004   K 3.9 03/08/2022 0820   CL 107 03/08/2022 0820   CO2 27 03/08/2022 0820   GLUCOSE 99 03/08/2022 0820   BUN 11 03/08/2022 0820   BUN 10 10/01/2021 1004   CREATININE 0.66 03/08/2022 0820   CALCIUM 9.2 03/08/2022 0820   GFRNONAA >60 03/08/2022 0820   GFRAA >60 06/03/2020 0949   GFRAA >60 02/26/2020 0915   No results found for: "INR", "PROTIME" No results found for: "PTT"  Social History   Socioeconomic History   Marital status: Legally Separated    Spouse name: Not on file   Number of children: 3   Years of education: Not on file   Highest education level: 9th grade  Occupational History   Occupation: Architect  Tobacco Use   Smoking status: Never   Smokeless tobacco: Never  Vaping Use   Vaping Use: Never used  Substance and Sexual Activity   Alcohol use: No   Drug use: Never   Sexual activity: Yes    Birth control/protection: Surgical  Other Topics Concern   Not on file  Social History Narrative   Not on file   Social Determinants of Health   Financial Resource Strain: Not on file  Food Insecurity: No Food Insecurity (02/23/2021)   Hunger Vital Sign    Worried  About Running Out of Food in the Last Year: Never true    Ran Out of Food in the Last Year: Never true  Transportation Needs: No Transportation Needs (02/23/2021)   PRAPARE - Hydrologist (Medical): No    Lack of Transportation (Non-Medical): No  Physical Activity: Not on file  Stress: Not on file  Social Connections: Not on file  Intimate Partner Violence: Not on file   Family History  Problem Relation Age of Onset   Heart disease Maternal Grandmother      REVIEW OF SYSTEMS:  Reviewed with the patient as per HPI. PSYCH:  [++] Dental phobia     VITAL SIGNS: BP 120/78 (BP Location: Right Arm, Patient Position: Sitting, Cuff Size: Normal)   Pulse 80   Temp 99.5 F (37.5 C) (Oral)   LMP 01/11/2019 (Exact Date)    PHYSICAL EXAM: GENERAL:  Well-developed, comfortable and in no apparent distress. NEUROLOGICAL:  Alert and oriented to person, place and  time. EXTRAORAL:  Facial symmetry present without any edema or erythema.  No swelling or lymphadenopathy.  TMJ asymptomatic without clicks or crepitations. INTRAORAL:  Soft tissues appear well-perfused and mucous membranes moist.  FOM and vestibules soft and not raised. Oral cavity without mass or lesion. No signs of infection, parulis, sinus tract, edema or erythema evident upon exam.  DENTAL EXAM: Hard tissue (limited) exam was completed and charted.     OVERALL IMPRESSION:  Fair remaining dentition.       ORAL HYGIENE:  Poor  PERIODONTAL:  Inflamed, erythematous gingival tissue.  Generalized calculus accumulation. [+] Recession:  Generalized CARIES:  #18 severe decay; #12D, #44M, #15D, #4M DEFECTIVE RESTORATIONS:  Missing 3-unit bridge on lower left replacing tooth #19 (pontic); lower right side also missing 4-unit bridge replacing teeth #28 and #30. ENDODONTICS: Vitality testing/other testing:  Tested teeth numbers 17 and 18 for palpation, percussion, mobility and air/water.  Tooth #18 [+] to  percussion and palpation. PROSTHODONTICS:  PFM full-coverage crowns on #8, #9, #10 and #25 OCCLUSION:  Unable to adequately assess occlusion Non-functional teeth:  #3, #5, #14, #17 Supra-erupted teeth:  #3, #14 OTHER FINDINGS:  [+]  Drifting:  #17, #18, #31 and #32 towards mesial   RADIOGRAPHIC EXAM:  PAN, 1 Bitewing and 1 Periapical image were exposed and interpreted.  Condyles seated bilaterally in fossas.  No evidence of abnormal pathology.  All visualized osseous structures appear WNL. There is a unilateral radiopacity evident on R mandible in b/w teeth #'s 27 & 29- appearance is consistent w/ metal or amalgam restorative material.   Missing teeth #'s 16, 19, 28 & 30.  #3 & #14 appear supra-erupted.   Localized mild horizontal bone loss distal to #21, #20 & mesial to #18. Radiographic calculus evident.  Existing restoration in tooth #18.  #20 prepared for crown w/ missing definitive restoration.  #18 has deep decay approximating the pulp w/ widened PDL space at root apices+developing periapical radiolucency.  Caries- #12D, #30M, #15D & #74M   ASSESSMENT:  1.  History of breast cancer + chemoradiation therapy 2.  On current chemotherapy regimen 3.  Limited dental examination 4.  Caries 5.  Acute apical periodontitis (tooth #18) 6.  Periodontitis 7.  Gingival recession 8.  Defective restorations 9.  Missing teeth 10.  Dental Phobia 11.  Accretions on teeth   PLAN AND RECOMMENDATIONS: I discussed the risks, benefits, and complications of various scenarios with the patient in relationship to their medical and dental conditions.  I explained all significant findings of the dental consultation with the patient including how there is a very large cavity in tooth #18 which has reached the nerve of her tooth at this point which is why she started having pain.  I explained that the nerve of the tooth is dying and does need to either be extracted or treated with a root canal (if even  restorable due to loss of coronal tooth structure) and a crown. I explained that due to her immunocompromised state going through chemotherapy, I would recommend having the tooth extracted to eliminate the source of infection and in the future explore further treatment options to replace missing teeth.  The patient verbalized understanding of all findings, discussion, and recommendations.  We then discussed various treatment options to include no treatment, multiple extractions with alveoloplasty, pre-prosthetic surgery as indicated, periodontal therapy, dental restorations, root canal therapy, crown and bridge therapy, implant therapy, and replacement of missing teeth as indicated.  The patient verbalized understanding of all options, and currently wishes to proceed with extraction of tooth #18.  Recommend referring the patient to an oral surgeon for the extraction due to her severe dental phobia and wish for IV sedation.  I also explained to the patient that our clinic is closed next week (7/3-7/7) and she would be able to receive treatment as soon as possible which is indicated in her case and because of her medical history.  She verbalized understanding and is agreeable to this plan.  Plan to discuss all findings and recommendations with medical team and coordinate future care as needed.  Urgent referral will be placed to [facility] for extraction.  The patient is aware that she will be receiving a phone call from their office to schedule an appointment.  The patient will need to establish care at a dental office of her choice for routine dental care including replacement of missing teeth as needed, cleanings and exams. Call if any questions or concerns arise.  Follow-up in the hospital dental clinic as needed.  All questions and concerns were invited and addressed.  The patient tolerated today's visit well and departed in stable condition.  Charlaine Dalton, D.M.D.

## 2022-03-11 DIAGNOSIS — K06020 Generalized gingival recession, unspecified: Secondary | ICD-10-CM | POA: Insufficient documentation

## 2022-03-11 DIAGNOSIS — F40232 Fear of other medical care: Secondary | ICD-10-CM | POA: Insufficient documentation

## 2022-03-11 DIAGNOSIS — K044 Acute apical periodontitis of pulpal origin: Secondary | ICD-10-CM | POA: Insufficient documentation

## 2022-03-11 DIAGNOSIS — K0850 Unsatisfactory restoration of tooth, unspecified: Secondary | ICD-10-CM | POA: Insufficient documentation

## 2022-03-11 DIAGNOSIS — K036 Deposits [accretions] on teeth: Secondary | ICD-10-CM | POA: Insufficient documentation

## 2022-03-11 DIAGNOSIS — K029 Dental caries, unspecified: Secondary | ICD-10-CM | POA: Insufficient documentation

## 2022-03-11 DIAGNOSIS — K0530 Chronic periodontitis, unspecified: Secondary | ICD-10-CM | POA: Insufficient documentation

## 2022-03-11 DIAGNOSIS — K08109 Complete loss of teeth, unspecified cause, unspecified class: Secondary | ICD-10-CM | POA: Insufficient documentation

## 2022-03-11 DIAGNOSIS — Z0120 Encounter for dental examination and cleaning without abnormal findings: Secondary | ICD-10-CM | POA: Insufficient documentation

## 2022-03-17 ENCOUNTER — Encounter: Payer: Self-pay | Admitting: *Deleted

## 2022-03-18 ENCOUNTER — Ambulatory Visit
Admission: RE | Admit: 2022-03-18 | Discharge: 2022-03-18 | Disposition: A | Discharge status: Home / Self Care | Payer: No Typology Code available for payment source | Source: Ambulatory Visit | Attending: Hematology and Oncology | Admitting: Hematology and Oncology

## 2022-03-18 DIAGNOSIS — Z170 Estrogen receptor positive status [ER+]: Secondary | ICD-10-CM

## 2022-03-22 ENCOUNTER — Other Ambulatory Visit: Payer: No Typology Code available for payment source

## 2022-03-22 ENCOUNTER — Inpatient Hospital Stay: Admission: RE | Admit: 2022-03-22 | Payer: No Typology Code available for payment source | Source: Ambulatory Visit

## 2022-03-23 ENCOUNTER — Encounter: Payer: Self-pay | Admitting: *Deleted

## 2022-03-24 ENCOUNTER — Inpatient Hospital Stay
Payer: No Typology Code available for payment source | Attending: Hematology and Oncology | Admitting: Hematology and Oncology

## 2022-03-24 ENCOUNTER — Encounter: Payer: Self-pay | Admitting: Nutrition

## 2022-03-24 ENCOUNTER — Encounter: Payer: Self-pay | Admitting: Hematology and Oncology

## 2022-03-24 ENCOUNTER — Other Ambulatory Visit: Payer: Self-pay

## 2022-03-24 VITALS — BP 113/69 | HR 100 | Temp 97.9°F | Resp 17 | Wt 188.8 lb

## 2022-03-24 DIAGNOSIS — Z9221 Personal history of antineoplastic chemotherapy: Secondary | ICD-10-CM | POA: Insufficient documentation

## 2022-03-24 DIAGNOSIS — C50412 Malignant neoplasm of upper-outer quadrant of left female breast: Secondary | ICD-10-CM | POA: Insufficient documentation

## 2022-03-24 DIAGNOSIS — C792 Secondary malignant neoplasm of skin: Secondary | ICD-10-CM | POA: Insufficient documentation

## 2022-03-24 DIAGNOSIS — R519 Headache, unspecified: Secondary | ICD-10-CM | POA: Insufficient documentation

## 2022-03-24 DIAGNOSIS — R5383 Other fatigue: Secondary | ICD-10-CM | POA: Insufficient documentation

## 2022-03-24 DIAGNOSIS — R21 Rash and other nonspecific skin eruption: Secondary | ICD-10-CM | POA: Insufficient documentation

## 2022-03-24 DIAGNOSIS — E669 Obesity, unspecified: Secondary | ICD-10-CM | POA: Insufficient documentation

## 2022-03-24 DIAGNOSIS — Z5111 Encounter for antineoplastic chemotherapy: Secondary | ICD-10-CM | POA: Insufficient documentation

## 2022-03-24 DIAGNOSIS — K521 Toxic gastroenteritis and colitis: Secondary | ICD-10-CM | POA: Insufficient documentation

## 2022-03-24 DIAGNOSIS — Z170 Estrogen receptor positive status [ER+]: Secondary | ICD-10-CM

## 2022-03-24 DIAGNOSIS — C50919 Malignant neoplasm of unspecified site of unspecified female breast: Secondary | ICD-10-CM

## 2022-03-24 DIAGNOSIS — Z171 Estrogen receptor negative status [ER-]: Secondary | ICD-10-CM | POA: Insufficient documentation

## 2022-03-24 DIAGNOSIS — Z923 Personal history of irradiation: Secondary | ICD-10-CM | POA: Insufficient documentation

## 2022-03-24 DIAGNOSIS — G893 Neoplasm related pain (acute) (chronic): Secondary | ICD-10-CM | POA: Insufficient documentation

## 2022-03-24 DIAGNOSIS — M810 Age-related osteoporosis without current pathological fracture: Secondary | ICD-10-CM | POA: Insufficient documentation

## 2022-03-24 NOTE — Assessment & Plan Note (Addendum)
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  She was most recently seen for ongoing breast redness, given a course of antibiotics but had persistent redness hence we proceeded with a punch biopsy.  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  Pathology was sent to [facility] for second opinion as well. This confirmed triple negative breast cancer.  We have recommended neoadjuvant chemotherapy with TC every 21 days for 4 cycles followed by adjuvant xeloda. She says she couldn't tolerate [place] well at all, had intractable cough, sob and skin rash.  She received 2 cycles of TC.  Patient recently called her Spanish interpreter and complained of worsening breast pain, heaviness and wondered if the chemotherapy is not quite helping her hence we have schedule her for an interim visit.    On PE, she didn't have a palpable mass so hard to discern if she is responding. I notice there are some areas which have improved such as a posterior chest wall but agree with ongoing redness and induration of the breast which is concerning.  At this time since there is concern for progression of disease on neoadjuvant chemo, I would recommend moving forward with surgery, mastectomy, axillary lymph node dissection followed by adjuvant Xeloda.  We will also attempt sending Caris testing for molecular profiling of the tumor.  If she has strong PD-L1 positivity and if she is agreeable to trying pembrolizumab again, we can attempt a combination of capecitabine with pembrolizumab adjuvant.  She agrees to the above plan. Will send her back to Dr Donne Hazel. She asked about second opinion at [facility], we can certainly arrange this if she wants to pursue this  #2, chemotherapy-induced diarrhea, mild, use as needed Imodium 3.  Osteoporosis, we have discussed about  bisphosphonate with the need for dental clearance prior to administration.  In the interim she was advised to take vitamin D and calcium as tolerated.  She understands that osteoporosis is not immediately life-threatening hence we may have to wait for completion of current treatment and getting a dental evaluation before proceeding with bisphosphonates.  Thank you for consulting [place] the care of this patient.  Please do not hesitate to contact us with any additional questions or concerns.

## 2022-03-24 NOTE — Progress Notes (Signed)
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             (a) chest CT scan and bone scan 02/07/2020 showed no evidence of metastatic disease   (1) neoadjuvant chemotherapy consisting of doxorubicin and cyclophosphamide in dose dense fashion x4 started 02/12/2020, completed 03/25/2020, followed by paclitaxel and carboplatin weekly x12 started 04/08/2020 and completed on 05/28/2020             (a) echo 02/07/2020 shows an ejection fraction in the 55-60% range             (b) Epirubicin substituted for Doxorubicin starting with cycle 2 of AC secondary to allergic rash from Doxorubicin   (2) status post left lumpectomy and sentinel lymph node sampling 07/15/2020 for a ypT1b ypN0 residual invasive ductal carcinoma, grade 3, with negative margins.             (a) a total of 4 left axillary lymph nodes were removed             (b) repeat prognostic panel on the final pathology again triple negative, with an MIB-1 of 50%.   (3) adjuvant radiation completed 10/09/2020             (a) sensitizing capecitabine attempted but not tolerated by the patient   (4) genetics testing 02/07/2020 through the [facility] Common Hereditary Cancers Panel found no deleterious mutations in  APC, ATM, AXIN2, BARD1, BMPR1A, BRCA1, BRCA2, BRIP1, CDH1, CDKN2A (p14ARF), CDKN2A (p16INK4a), CKD4, CHEK2, CTNNA1, DICER1,  EPCAM (Deletion/duplication testing only), GREM1 (promoter region deletion/duplication testing only), KIT, MEN1, MLH1, MSH2, MSH3, MSH6, MUTYH, NBN, NF1, NHTL1, PALB2, PDGFRA, PMS2, POLD1, POLE, PTEN, RAD50, RAD51C, RAD51D, RNF43, SDHB, SDHC, SDHD, SMAD4, SMARCA4. STK11, TP53, TSC1, TSC2, and VHL.  The following genes were evaluated for sequence changes only: SDHA and HOXB13 c.251G>A variant only.             (a) VUS in MSH6 called c.2744C>G identified.    (5)  adjuvant pembrolizumab started 11/23/2020, to continue for 1 year             (a) discontinued after 01/04/2021 dose with multiple side effects requiring steroids for resolution  CURRENT THERAPY: Observation  INTERVAL HISTORY:  [name] 46 y.o. female currently on chemotherapy who is here for follow up. Translator [name] present.  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 CT imaging as well as MRI brain imaging without any evidence of metastatic breast cancer. Pathology sent to [facility] for second opinion which confirmed triple neg breast cancer. Since she previously got [facility] Keytruda as well as CarboTaxol, we have attempted TC for second line neoadjuvant.  Patient however called that her breast is getting heavier and more painful and she is wondering if the chemo is not really helping.  She is here for an interim visit. She says she is  very worried, she many side effects from chemotherapy she says.  She has some mild diarrhea about 3-4 semiformed stools a day.  She does not feel well overall from the chemotherapy.  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 Rest of the pertinent 10 point ROS reviewed and negative   Patient Active Problem List   Diagnosis Date Noted   Encounter for dental examination 03/11/2022   Acute apical periodontitis 03/11/2022   Phobia of dental procedure 03/11/2022   Defective dental restoration 03/11/2022   Accretions on teeth 03/11/2022    Chronic periodontitis 03/11/2022   Teeth missing 03/11/2022   Caries 03/11/2022   Generalized gingival recession 03/11/2022   Pain, dental 03/08/2022   Cancer-related pain 03/08/2022   Port-A-Cath in place 02/15/2022   Oral allergy syndrome, subsequent encounter 02/03/2022   Adverse effect of other drugs, medicaments and biological substances, subsequent encounter 02/02/2022   Other adverse food reactions, not elsewhere classified, subsequent encounter 02/02/2022   Chest tightness 02/02/2022   Other allergic rhinitis 02/02/2022   Allergic conjunctivitis of both eyes 02/02/2022   Language barrier 12/11/2020   Morbid obesity ([place])    History of breast cancer    Genetic testing 02/13/2020   Malignant neoplasm of upper-outer quadrant of left breast in female, estrogen receptor positive ([place]) 01/27/2020   Impaired ability to use community resources due to language barrier 07/13/2013    is allergic to omnipaque [iohexol], doxorubicin hcl, keytruda [pembrolizumab], other, and tape.  MEDICAL HISTORY: Past Medical History:  Diagnosis Date   Allergy    Asthma    Breast cancer in female [facility])    Left   Breast disorder    breast cancer May 2021   Chronic back pain    History of breast cancer    History of COVID-19 04/20/2021   Hx of migraines    Morbid obesity ([place])    Personal history of chemotherapy    Personal history of radiation therapy     SURGICAL HISTORY: Past Surgical History:  Procedure Laterality Date   BREAST LUMPECTOMY     BREAST LUMPECTOMY WITH RADIOACTIVE SEED AND SENTINEL LYMPH NODE BIOPSY Left 07/15/2020   Procedure: LEFT BREAST LUMPECTOMY WITH RADIOACTIVE SEED AND LEFT AXILLARY SENTINEL LYMPH NODE BIOPSY, LEFT AXILLARY NODE RADIOACTIVE SEED GUIDED EXCISION, LEFT BREAST RADIOACTIVE SEED GUIDED EXCISION IM NODE;  Surgeon: Rolm Bookbinder, MD;  Location: [facility];  Service: General;  Laterality: Left;  PEC BLOCK   CESAREAN SECTION WITH BILATERAL TUBAL LIGATION  Bilateral 07/23/2015   Procedure: CESAREAN SECTION WITH BILATERAL TUBAL LIGATION;  Surgeon: Donnamae Jude, MD;  Location: [facility];  Service: Obstetrics;  Laterality: Bilateral;   IR IMAGING GUIDED PORT INSERTION  02/11/2022   PORT-A-CATH REMOVAL N/A 04/30/2021   Procedure: REMOVAL PORT-A-CATH;  Surgeon: Rolm Bookbinder, MD;  Location: [facility];  Service: General;  Laterality: N/A;   PORTACATH PLACEMENT Right 02/11/2020   Procedure: INSERTION PORT-A-CATH WITH ULTRASOUND GUIDANCE;  Surgeon: Rolm Bookbinder, MD;  Location: [facility];  Service: General;  Laterality: Right;   TUBAL LIGATION      SOCIAL HISTORY: Social History   Socioeconomic History   Marital status: Legally Separated    Spouse name: Not on file   Number of children: 3   Years of education: Not on file   Highest education level: 9th grade  Occupational History   Occupation: Architect  Tobacco Use   Smoking status: Never   Smokeless tobacco: Never  Vaping Use   Vaping Use:  Never used  Substance and Sexual Activity   Alcohol use: No   Drug use: Never   Sexual activity: Yes    Birth control/protection: Surgical  Other Topics Concern   Not on file  Social History Narrative   Not on file   Social Determinants of Health   Financial Resource Strain: Not on file  Food Insecurity: No Food Insecurity (02/23/2021)   Hunger Vital Sign    Worried About Running Out of Food in the Last Year: Never true    Ran Out of Food in the Last Year: Never true  Transportation Needs: No Transportation Needs (02/23/2021)   PRAPARE - Hydrologist (Medical): No    Lack of Transportation (Non-Medical): No  Physical Activity: Not on file  Stress: Not on file  Social Connections: Not on file  Intimate Partner Violence: Not on file    FAMILY HISTORY: Family History  Problem Relation Age of Onset   Heart disease Maternal Grandmother      PHYSICAL EXAMINATION  ECOG  PERFORMANCE STATUS: 1 - Symptomatic but completely ambulatory  Vitals:   03/24/22 1103  BP: 113/69  Pulse: 100  Resp: 17  Temp: 97.9 F (36.6 C)  SpO2: 98%    Physical Exam Constitutional:      General: She is not in acute distress.    Appearance: Normal appearance. She is not toxic-appearing.  HENT:     Head: Normocephalic and atraumatic.  Eyes:     General: No scleral icterus. Cardiovascular:     Rate and Rhythm: Normal rate and regular rhythm.     Pulses: Normal pulses.     Heart sounds: Normal heart sounds.  Pulmonary:     Effort: Pulmonary effort is normal.     Breath sounds: Normal breath sounds.  Chest:       Comments: Erythema of the left breast appears slightly better.  There are definitely areas of demarcation between involved and uninvolved breast on exam today the posterior chest wall which was very erythematous at the beginning has now completely resolved.  There is definitely ongoing hardening and darkness in some areas of the breast which I am not sure if it has significantly worsened. Abdominal:     General: Abdomen is flat. Bowel sounds are normal. There is no distension.     Palpations: Abdomen is soft.     Tenderness: There is no abdominal tenderness.  Musculoskeletal:        General: No swelling.     Cervical back: Neck supple.  Lymphadenopathy:     Cervical: No cervical adenopathy.     Upper Body:     Left upper body: No pectoral adenopathy.  Skin:    General: Skin is warm and dry.     Findings: No rash.  Neurological:     General: No focal deficit present.     Mental Status: She is alert.  Psychiatric:        Mood and Affect: Mood normal.        Behavior: Behavior normal.     LABORATORY DATA:  None for this visit  Pictures in media   ASSESSMENT and PLAN:   Malignant neoplasm of upper-outer quadrant of left breast in female, estrogen receptor positive ([place]) Maansi is a 46 year old Spanish-speaking woman who has a history of  functionally triple negative breast cancer.  She was most recently seen for ongoing breast redness, given a course of antibiotics but had persistent redness hence we proceeded with  a punch biopsy.  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  Pathology was sent to [facility] for second opinion as well. This confirmed triple negative breast cancer.  We have recommended neoadjuvant chemotherapy with TC every 21 days for 4 cycles followed by adjuvant xeloda. She says she couldn't tolerate [place] well at all, had intractable cough, sob and skin rash.  She received 2 cycles of TC.  Patient recently called her Spanish interpreter and complained of worsening breast pain, heaviness and wondered if the chemotherapy is not quite helping her hence we have schedule her for an interim visit.    On PE, she didn't have a palpable mass so hard to discern if she is responding. I notice there are some areas which have improved such as a posterior chest wall but agree with ongoing redness and induration of the breast which is concerning.  At this time since there is concern for progression of disease on neoadjuvant chemo, I would recommend moving forward with surgery, mastectomy, axillary lymph node dissection followed by adjuvant Xeloda.  We will also attempt sending [facility] testing for molecular profiling of the tumor.  If she has strong PD-L1 positivity and if she is agreeable to trying pembrolizumab again, we can attempt a combination of capecitabine with pembrolizumab adjuvant.  She agrees to the above plan. Will send her back to Dr Donne Hazel. She asked about second opinion at [facility], we can certainly arrange this if she wants to pursue this  #2, chemotherapy-induced diarrhea, mild, use as needed Imodium 3.  Osteoporosis, we have discussed about bisphosphonate with the need for dental clearance prior to administration.  In  the interim she was advised to take vitamin D and calcium as tolerated.  She understands that osteoporosis is not immediately life-threatening hence we may have to wait for completion of current treatment and getting a dental evaluation before proceeding with bisphosphonates.  Thank you for consulting [place] the care of this patient.  Please do not hesitate to contact us with any additional questions or concerns.    Total encounter time:30 minutes*in face-to-face visit time, chart review, lab review, care coordination, order entry, and documentation of the encounter time.  *Total Encounter Time as defined by the Centers for Medicare and Medicaid Services includes, in addition to the face-to-face time of a patient visit (documented in the note above) non-face-to-face time: obtaining and reviewing outside history, ordering and reviewing medications, tests or procedures, care coordination (communications with other health care professionals or caregivers) and documentation in the medical record.

## 2022-03-24 NOTE — Progress Notes (Signed)
Received call from [place] that patient was requesting Ensure Plus samples. Provided one complimentary case of Ensure Plus High Protein.

## 2022-03-28 MED FILL — Dexamethasone Sodium Phosphate Inj 100 MG/10ML: INTRAMUSCULAR | Qty: 1 | Status: AC

## 2022-03-29 ENCOUNTER — Other Ambulatory Visit: Payer: Self-pay | Admitting: *Deleted

## 2022-03-29 ENCOUNTER — Inpatient Hospital Stay: Payer: No Typology Code available for payment source

## 2022-03-29 ENCOUNTER — Telehealth: Payer: Self-pay | Admitting: *Deleted

## 2022-03-29 ENCOUNTER — Inpatient Hospital Stay: Payer: No Typology Code available for payment source | Admitting: Physician Assistant

## 2022-03-29 ENCOUNTER — Ambulatory Visit: Payer: Self-pay | Attending: Hematology and Oncology | Admitting: *Deleted

## 2022-03-29 ENCOUNTER — Ambulatory Visit: Payer: Self-pay

## 2022-03-29 ENCOUNTER — Encounter: Payer: Self-pay | Admitting: *Deleted

## 2022-03-29 VITALS — BP 121/67 | Wt 189.4 lb

## 2022-03-29 DIAGNOSIS — Z853 Personal history of malignant neoplasm of breast: Secondary | ICD-10-CM

## 2022-03-29 DIAGNOSIS — N6325 Unspecified lump in the left breast, overlapping quadrants: Secondary | ICD-10-CM

## 2022-03-29 DIAGNOSIS — Z170 Estrogen receptor positive status [ER+]: Secondary | ICD-10-CM

## 2022-03-29 DIAGNOSIS — L539 Erythematous condition, unspecified: Secondary | ICD-10-CM

## 2022-03-29 NOTE — Telephone Encounter (Signed)
-----   Message from Severiano Gilbert, RN sent at 03/28/2022 12:11 PM EDT ----- Regarding: Questions Regarding Treatment Plan Moving Forward Hello,  This patient reached out to our scheduling department on 7/17 to cancel upcoming treatment on 7/18. Last office note on 7/13 noted that there was concern of progression on neoadjuvant therapy and she was to proceed with surgery with Dr. Donne Hazel. There are currently no orders in for surgery nor note of active referral to Dr. Donne Hazel. Would we be able to get clarification regarding this matter?  Thank you, Junie Panning, RN

## 2022-03-29 NOTE — Telephone Encounter (Signed)
This RN contacted [facility]- and was informed pt has an appt on 03/31/2022 with Dr Donne Hazel for surgical discussion. Documented as pt aware of appt.

## 2022-03-29 NOTE — Progress Notes (Signed)
[name] is a 46 y.o. female who presents to [facility] clinic today with complaint of feeling like she is not responding to her current treatment for inflammatory breast cancer of the left breast.   Patient has a history of triple negative left breast cancer diagnosed in 2021.  She is negative for any genetic mutations.  Patient with second diagnosis of breast cancer in the left breast of inflammatory breast cancer diagnosed in May of 2023.  Patient is currently undergoing neoadjuvant chemotherapy at [facility] under the care of Dr. Chryl Thomas.  She was referred to [facility] for financial assistance for imaging to assess response to her current treatment plan.   Pap Smear: Pap not smear completed today. Last Pap smear was 01/18/21 at a [facility] clinic and was normal with negative / negative results. Per patient has no history of an abnormal Pap smear. Last Pap smear result is available in Epic.   Physical exam: Breasts Physical Exam Chest:  Breasts:    Breasts are asymmetrical.     Right: Skin change present. No swelling, bleeding, inverted nipple, mass, nipple discharge or tenderness.     Left: Swelling, inverted nipple, mass, nipple discharge, skin change and tenderness present. No bleeding.       Comments: Patient states the right breast area of erythemia is new and been present for 4 days Lymphadenopathy:     Upper Body:     Right upper body: No supraclavicular or axillary adenopathy.     Left upper body: No supraclavicular or axillary adenopathy.  Breasts symmetrical. No skin abnormalities bilateral breasts. No nipple retraction bilateral breasts. No nipple discharge bilateral breasts. No lymphadenopathy. No lumps palpated bilateral breasts.       Pelvic/Bimanual Pap is not indicated today    Smoking History: Patient has never smoked    Patient Navigation: Patient education provided. Access to services provided for patient through [place], the [facility] interpreter  provided interpretation. No transportation provided   Colorectal Cancer Screening: Per patient has never had colonoscopy completed No complaints today.    Breast and Cervical Cancer Risk Assessment: Patient does not have family history of breast cancer, known genetic mutations, or radiation treatment to the chest before age 40. Patient does not have history of cervical dysplasia, immunocompromised, or DES exposure in-utero.  Risk Assessment     Risk Scores       03/29/2022 02/23/2021   Last edited by: Monica Revel, LPN [place], Monica Mamie Nick, LPN   5-year risk: 2 % 2.1 %   Lifetime risk: 14.1 % 14.4 %            A: BCCCP exam without pap smear   P: Referred patient to the [facility] for a diagnostic mammogram. Appointment  to be scheduled.  Message sent to Dr. Chryl Thomas, Monica Sia, RN and Monica Pert, RN to confirm need for an MRI and additional imaging of the right breast.  Patient is scheduled to see Dr. Donne Thomas on 03/31/22.  Per message from Monica Sia, RN, Dr. Donne Thomas will determine if MRI is needed.    Monica Junker, RN 03/29/2022 11:55 AM

## 2022-03-29 NOTE — Progress Notes (Deleted)
  Subjective:     Patient ID: [name], female   DOB: 1976/04/17, 46 y.o.   MRN: [id number]  HPI   Review of Systems     Objective:   Physical Exam Chest:  Breasts:    Breasts are asymmetrical.     Right: Skin change present. No swelling, bleeding, inverted nipple, mass, nipple discharge or tenderness.     Left: Swelling, inverted nipple, mass, nipple discharge, skin change and tenderness present. No bleeding.       Comments: Patient states the right breast area of erythemia is new and been present for 4 days Lymphadenopathy:     Upper Body:     Right upper body: No supraclavicular or axillary adenopathy.     Left upper body: No supraclavicular or axillary adenopathy.      Assessment:     ***    Plan:     ***

## 2022-03-30 ENCOUNTER — Other Ambulatory Visit: Payer: Self-pay | Admitting: Obstetrics and Gynecology

## 2022-03-30 ENCOUNTER — Other Ambulatory Visit: Payer: Self-pay

## 2022-03-30 ENCOUNTER — Encounter: Payer: Self-pay | Admitting: Hematology and Oncology

## 2022-03-30 ENCOUNTER — Ambulatory Visit: Payer: Self-pay | Admitting: Hematology and Oncology

## 2022-03-30 ENCOUNTER — Ambulatory Visit
Admission: RE | Admit: 2022-03-30 | Discharge: 2022-03-30 | Disposition: A | Discharge status: Home / Self Care | Payer: No Typology Code available for payment source | Source: Ambulatory Visit | Attending: Obstetrics and Gynecology | Admitting: Obstetrics and Gynecology

## 2022-03-30 DIAGNOSIS — L539 Erythematous condition, unspecified: Secondary | ICD-10-CM

## 2022-03-30 DIAGNOSIS — Z853 Personal history of malignant neoplasm of breast: Secondary | ICD-10-CM

## 2022-03-31 ENCOUNTER — Other Ambulatory Visit: Payer: Self-pay | Admitting: *Deleted

## 2022-03-31 ENCOUNTER — Encounter: Payer: Self-pay | Admitting: *Deleted

## 2022-03-31 ENCOUNTER — Inpatient Hospital Stay: Payer: No Typology Code available for payment source

## 2022-03-31 ENCOUNTER — Telehealth: Payer: Self-pay | Admitting: *Deleted

## 2022-03-31 DIAGNOSIS — C50412 Malignant neoplasm of upper-outer quadrant of left female breast: Secondary | ICD-10-CM

## 2022-03-31 DIAGNOSIS — N6325 Unspecified lump in the left breast, overlapping quadrants: Secondary | ICD-10-CM

## 2022-03-31 DIAGNOSIS — R928 Other abnormal and inconclusive findings on diagnostic imaging of breast: Secondary | ICD-10-CM

## 2022-03-31 NOTE — Telephone Encounter (Signed)
This RN spoke with [facility]/NP per request to schedule [name] for visit tomorrow for clarification.   She states [name] was seen by Dr Donne Hazel today - need to review treatment plan including possible staging studies -and or change in therapy for best outcome.  This RN scheduled [name] and called Lavon Paganini - spanish interpreter who will inform patient of above.

## 2022-04-01 ENCOUNTER — Other Ambulatory Visit: Payer: Self-pay

## 2022-04-01 ENCOUNTER — Other Ambulatory Visit (HOSPITAL_COMMUNITY): Payer: Self-pay

## 2022-04-01 ENCOUNTER — Other Ambulatory Visit: Payer: Self-pay | Admitting: Hematology and Oncology

## 2022-04-01 ENCOUNTER — Encounter: Payer: Self-pay | Admitting: Adult Health

## 2022-04-01 ENCOUNTER — Encounter: Payer: Self-pay | Admitting: Hematology and Oncology

## 2022-04-01 ENCOUNTER — Inpatient Hospital Stay (HOSPITAL_BASED_OUTPATIENT_CLINIC_OR_DEPARTMENT_OTHER): Payer: No Typology Code available for payment source | Admitting: Adult Health

## 2022-04-01 VITALS — BP 103/70 | HR 95 | Temp 97.9°F | Wt 188.2 lb

## 2022-04-01 DIAGNOSIS — R928 Other abnormal and inconclusive findings on diagnostic imaging of breast: Secondary | ICD-10-CM

## 2022-04-01 DIAGNOSIS — C792 Secondary malignant neoplasm of skin: Secondary | ICD-10-CM

## 2022-04-01 DIAGNOSIS — C50919 Malignant neoplasm of unspecified site of unspecified female breast: Secondary | ICD-10-CM | POA: Insufficient documentation

## 2022-04-01 DIAGNOSIS — C50412 Malignant neoplasm of upper-outer quadrant of left female breast: Secondary | ICD-10-CM

## 2022-04-01 DIAGNOSIS — Z170 Estrogen receptor positive status [ER+]: Secondary | ICD-10-CM

## 2022-04-01 DIAGNOSIS — C50912 Malignant neoplasm of unspecified site of left female breast: Secondary | ICD-10-CM

## 2022-04-01 MED ORDER — DIPHENHYDRAMINE HCL 50 MG PO TABS
ORAL_TABLET | ORAL | 0 refills | Status: DC
  Filled 2022-04-01: qty 1, fill #0

## 2022-04-01 MED ORDER — DEXAMETHASONE 4 MG PO TABS
ORAL_TABLET | ORAL | 6 refills | Status: DC
  Filled 2022-04-01: qty 12, 3d supply, fill #0

## 2022-04-01 MED ORDER — PREDNISONE 50 MG PO TABS
ORAL_TABLET | ORAL | 0 refills | Status: DC
  Filled 2022-04-01: qty 3, 1d supply, fill #0

## 2022-04-01 NOTE — Assessment & Plan Note (Signed)
[name] is here today for follow-up and evaluation of her rapidly progressive breast cancer.  She was initially diagnosed with stage IIIc functionally triple negative breast cancer status post neoadjuvant chemotherapy with Adriamycin, Cytoxan x4 followed by Taxol carbo x12, left lumpectomy, and adjuvant radiation.  [name] was unable to tolerate capecitabine with her adjuvant radiation.  She was also unable to tolerate pembrolizumab which she started in March 14 but only was able to take 2 doses.  In April 2023 she began noting breast redness and pain she was put on antibiotics and scheduled for follow-up 1 week later which showed no improvement and skin biopsy arranged several days later confirmed breast cancer to the skin.  I was unable to locate a prognostic panel from that biopsy.    Dr. Lindi Adie sat down with [name] and our [place] interpreter [name] and discussed the situation including her quickly progressing breast cancer.  In review of her initial pathology where HER2 was 1+ she is a candidate to receive Enhertu.  Dr. Lindi Adie discussed risks and benefits with her in detail.  We are adding on a prognostic panel to the main night 2023 pathology.  She will also undergo breast biopsy and restaging scans.  She will return on Tuesday and hopefully we will have treatment approved by then so she can proceed.

## 2022-04-01 NOTE — Progress Notes (Signed)
DISCONTINUE ON PATHWAY REGIMEN - Breast     A cycle is every 21 days:     Docetaxel      Cyclophosphamide   **Always confirm dose/schedule in your pharmacy ordering system**  REASON: Disease Progression PRIOR TREATMENT: [id number]: TC - Docetaxel + Cyclophosphamide q21 Days x 4 Cycles TREATMENT RESPONSE: Progressive Disease (PD)  START ON PATHWAY REGIMEN - Breast     A cycle is every 21 days:     Fam-trastuzumab deruxtecan-nxki   **Always confirm dose/schedule in your pharmacy ordering system**  Patient Characteristics: Distant Metastases or Locoregional Recurrent Disease - Unresected or Locally Advanced Unresectable Disease Progressing after Neoadjuvant and Local Therapies, ER Negative/Unknown, Chemotherapy, HER2 Low, Second Line Therapeutic Status: Locally Advanced Disease - Unresectable and Progressing after Neoadjuvant and Local Therapies HER2 Status: Low ER Status: Negative (-) PR Status: Negative (-) Therapy Approach Indicated: Standard Chemotherapy/Endocrine Therapy Line of Therapy: Second Line Intent of Therapy: Non-Curative / Palliative Intent, Discussed with Patient

## 2022-04-01 NOTE — Progress Notes (Signed)
[facility] Follow up:    [name] I, NP No address on file   DIAGNOSIS:  Cancer Staging  Malignant neoplasm of upper-outer quadrant of left breast in female, estrogen receptor positive ([place]) Staging form: Breast, AJCC 8th Edition - Clinical stage from 01/29/2020: Stage IIIA (cT2, cN1, cM0, G3, ER+, PR-, HER2-) - Unsigned Stage prefix: Initial diagnosis Histologic grading system: 3 grade system - Pathologic stage from 07/15/2020: No Stage Recommended (ypT1b, pN0, cM0, G3, ER-, PR-, HER2-) - Signed by Gardenia Phlegm, NP on 04/01/2022 Stage prefix: Post-therapy Histologic grading system: 3 grade system   SUMMARY OF ONCOLOGIC HISTORY: Oncology History  Malignant neoplasm of upper-outer quadrant of left breast in female, estrogen receptor positive ([place])  01/23/2020 Initial Diagnosis   [place] woman status post left breast upper outer quadrant biopsy 01/23/2020 for a clinical T3 N1, 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             (a) chest CT scan and bone scan 02/07/2020 showed no evidence of metastatic disease   02/07/2020 Genetic Testing   Negative genetic testing. No pathogenic variants identified on the [facility] Common Hereditary Cancers Panel. VUS in MSH6 called c.2744C>G identified. The report date is 02/07/2020.  The Common Hereditary Cancers Panel offered by [facility] includes sequencing and/or deletion duplication testing of the following 48 genes: APC, ATM, AXIN2, BARD1, BMPR1A, BRCA1, BRCA2, BRIP1, CDH1, CDKN2A (p14ARF), CDKN2A (p16INK4a), CKD4, CHEK2, CTNNA1, DICER1, EPCAM (Deletion/duplication testing only), GREM1 (promoter region deletion/duplication testing only), KIT, MEN1, MLH1, MSH2, MSH3, MSH6, MUTYH, NBN, NF1, NHTL1, PALB2, PDGFRA, PMS2, POLD1, POLE, PTEN, RAD50, RAD51C, RAD51D, RNF43, SDHB, SDHC, SDHD, SMAD4, SMARCA4. STK11, TP53, TSC1, TSC2, and VHL.  The following genes were  evaluated for sequence changes only: SDHA and HOXB13 c.251G>A variant only.   02/12/2020 - 05/28/2020 Neo-Adjuvant Chemotherapy   neoadjuvant chemotherapy consisting of doxorubicin and cyclophosphamide in dose dense fashion x4 started 02/12/2020, completed 03/25/2020, followed by paclitaxel and carboplatin weekly x12 started 04/08/2020 and completed on 05/28/2020             (a) echo 02/07/2020 shows an ejection fraction in the 55-60% range             (b) Epirubicin substituted for Doxorubicin starting with cycle 2 of AC secondary to allergic rash from Doxorubicin   07/15/2020 Surgery   status post left lumpectomy and sentinel lymph node sampling 07/15/2020 for a ypT1b ypN0 residual invasive ductal carcinoma, grade 3, with negative margins.             (a) a total of 4 left axillary lymph nodes were removed             (b) repeat prognostic panel on the final pathology again triple negative, with an MIB-1 of 50%.   07/15/2020 Cancer Staging   Staging form: Breast, AJCC 8th Edition - Pathologic stage from 07/15/2020: No Stage Recommended (ypT1b, pN0, cM0, G3, ER-, PR-, HER2-) - Signed by Gardenia Phlegm, NP on 04/01/2022 Stage prefix: Post-therapy Histologic grading system: 3 grade system   08/26/2020 - 10/09/2020 Radiation Therapy   adjuvant radiation completed 10/09/2020             (a) sensitizing capecitabine attempted but not tolerated by the patient  08/26/2020 through 10/09/2020 Site Technique Total Dose (Gy) Dose per Fx (Gy) Completed Fx Beam Energies  Breast, Left: Breast_Lt 3D 50/50 2 25/25 15X  Breast, Left: Breast_Lt_PAB_SCV 3D 50/50  2 25/25 10X, 15X  Breast, Left: Breast_Lt_Bst 3D 10/10 2 5/5 6X, 10X    11/23/2020 - 01/04/2021 Adjuvant Chemotherapy   adjuvant pembrolizumab started 11/23/2020, to continue for 1 year             (a) discontinued after 01/04/2021 dose with multiple side effects requiring steroids for resolution   03/30/2022 Mammogram   Mammogram and ultrasound  show numerous masses throughout left breast not visualized in 01/2022, +left axillary lymphadenopathy, skin thickening in right breast and in upper abdomen just below sternum   04/06/2022 -  Chemotherapy   Patient is on Treatment Plan : BREAST METASTATIC fam-trastuzumab deruxtecan-nxki (Enhertu) q21d     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  Prognostic panel sent to [facility]: Left breast skin, punch biopsy: Skin with dermal lymphatic involvement by carcinoma (confirmed with outside CK7 IHC, reviewed). Biomarkers are reported as negative for ER, PR, Her2/neu (confirmed on review but please note sample size is extremely small).   02/15/2022 - 03/08/2022 Chemotherapy    Taxotere/Cytoxan x 2, discontinued due to progression of breast.   03/30/2022 Mammogram   Mammogram and ultrasound show numerous masses throughout left breast not visualized in 01/2022, +left axillary lymphadenopathy, skin thickening in right breast and in upper abdomen just below sternum   04/06/2022 -  Chemotherapy   Patient is on Treatment Plan : BREAST METASTATIC fam-trastuzumab deruxtecan-nxki (Enhertu) q21d       CURRENT THERAPY: progression on Taxotere and cytoxan  INTERVAL HISTORY: [name] 46 y.o. female returns for follow-up of her breast cancer.  Please note that she had skin punch biopsy on May 9 that demonstrated recurrent inflammatory breast cancer. Her most recent imaging with mammogram and ultrasound on July 19 demonstrated progression of her breast cancer.  She came into clinic today to see myself and Dr. Lindi Adie to discuss potential treatment options.  We reviewed her pathology and her cancer history along with treatment.  She is scheduled for CT chest abdomen pelvis along with a bone scan on April 13, 2022.  She is also scheduled for repeat breast biopsy on April 04, 2022.  She does report some breast pain due to  the breast cancer and is taking tramadol which helps this.   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[place]) 04/01/2022   Encounter for dental examination 03/11/2022   Acute apical periodontitis 03/11/2022   Phobia of dental procedure 03/11/2022   Defective dental restoration 03/11/2022   Accretions on teeth 03/11/2022   Chronic periodontitis 03/11/2022   Teeth missing 03/11/2022   Caries 03/11/2022   Generalized gingival recession 03/11/2022   Pain, dental 03/08/2022   Cancer-related pain 03/08/2022   Port-A-Cath in place 02/15/2022   Oral allergy syndrome, subsequent encounter 02/03/2022   Adverse effect of other drugs, medicaments and biological substances, subsequent encounter 02/02/2022   Other adverse food reactions, not elsewhere classified, subsequent encounter 02/02/2022   Chest tightness 02/02/2022   Other allergic rhinitis 02/02/2022   Allergic conjunctivitis of both eyes 02/02/2022   Language barrier 12/11/2020   Morbid obesity ([place])    History of breast cancer    Genetic testing 02/13/2020   Malignant neoplasm of upper-outer quadrant of left breast in female, estrogen receptor positive ([place]) 01/27/2020   Impaired ability to use community resources due to language barrier 07/13/2013    is allergic to omnipaque [  iohexol], doxorubicin hcl, keytruda [pembrolizumab], other, and tape.  MEDICAL HISTORY: Past Medical History:  Diagnosis Date   Allergy    Asthma    Breast cancer in female [facility])    Left   Breast disorder    breast cancer May 2021   Chronic back pain    History of breast cancer    History of COVID-19 04/20/2021   Hx of migraines    Morbid obesity ([place])    Personal history of chemotherapy    Personal history of radiation therapy     SURGICAL HISTORY: Past Surgical History:  Procedure Laterality Date   BREAST LUMPECTOMY     BREAST LUMPECTOMY WITH RADIOACTIVE SEED AND SENTINEL LYMPH NODE BIOPSY Left  07/15/2020   Procedure: LEFT BREAST LUMPECTOMY WITH RADIOACTIVE SEED AND LEFT AXILLARY SENTINEL LYMPH NODE BIOPSY, LEFT AXILLARY NODE RADIOACTIVE SEED GUIDED EXCISION, LEFT BREAST RADIOACTIVE SEED GUIDED EXCISION IM NODE;  Surgeon: Rolm Bookbinder, MD;  Location: [facility];  Service: General;  Laterality: Left;  PEC BLOCK   CESAREAN SECTION WITH BILATERAL TUBAL LIGATION Bilateral 07/23/2015   Procedure: CESAREAN SECTION WITH BILATERAL TUBAL LIGATION;  Surgeon: Donnamae Jude, MD;  Location: [facility];  Service: Obstetrics;  Laterality: Bilateral;   IR IMAGING GUIDED PORT INSERTION  02/11/2022   PORT-A-CATH REMOVAL N/A 04/30/2021   Procedure: REMOVAL PORT-A-CATH;  Surgeon: Rolm Bookbinder, MD;  Location: [facility];  Service: General;  Laterality: N/A;   PORTACATH PLACEMENT Right 02/11/2020   Procedure: INSERTION PORT-A-CATH WITH ULTRASOUND GUIDANCE;  Surgeon: Rolm Bookbinder, MD;  Location: [facility];  Service: General;  Laterality: Right;   TUBAL LIGATION      SOCIAL HISTORY: Social History   Socioeconomic History   Marital status: Legally Separated    Spouse name: Not on file   Number of children: 3   Years of education: Not on file   Highest education level: 9th grade  Occupational History   Occupation: Architect  Tobacco Use   Smoking status: Never   Smokeless tobacco: Never  Vaping Use   Vaping Use: Never used  Substance and Sexual Activity   Alcohol use: No   Drug use: Never   Sexual activity: Yes    Birth control/protection: Surgical  Other Topics Concern   Not on file  Social History Narrative   Not on file   Social Determinants of Health   Financial Resource Strain: Not on file  Food Insecurity: No Food Insecurity (03/29/2022)   Hunger Vital Sign    Worried About Running Out of Food in the Last Year: Never true    Ran Out of Food in the Last Year: Never true  Transportation Needs: No Transportation Needs (03/29/2022)   PRAPARE -  Hydrologist (Medical): No    Lack of Transportation (Non-Medical): No  Physical Activity: Not on file  Stress: Not on file  Social Connections: Not on file  Intimate Partner Violence: Not on file    FAMILY HISTORY: Family History  Problem Relation Age of Onset   Heart disease Maternal Grandmother     Review of Systems  Constitutional:  Negative for appetite change, chills, fatigue, fever and unexpected weight change.  HENT:   Negative for hearing loss, lump/mass and trouble swallowing.   Eyes:  Negative for eye problems and icterus.  Respiratory:  Negative for chest tightness, cough and shortness of breath.   Cardiovascular:  Negative for chest pain, leg swelling and palpitations.  Gastrointestinal:  Negative  for abdominal distention, abdominal pain, constipation, diarrhea, nausea and vomiting.  Endocrine: Negative for hot flashes.  Genitourinary:  Negative for difficulty urinating.   Musculoskeletal:  Negative for arthralgias.  Skin:  Negative for itching and rash.  Neurological:  Negative for dizziness, extremity weakness, headaches and numbness.  Hematological:  Negative for adenopathy. Does not bruise/bleed easily.  Psychiatric/Behavioral:  Negative for depression. The patient is not nervous/anxious.       PHYSICAL EXAMINATION  ECOG PERFORMANCE STATUS: 1 - Symptomatic but completely ambulatory  Vitals:   04/01/22 1020  BP: 103/70  Pulse: 95  Temp: 97.9 F (36.6 C)  SpO2: 99%    Physical Exam Constitutional:      General: She is not in acute distress.    Appearance: Normal appearance. She is not toxic-appearing.  HENT:     Head: Normocephalic and atraumatic.  Eyes:     General: No scleral icterus. Cardiovascular:     Rate and Rhythm: Normal rate and regular rhythm.     Pulses: Normal pulses.     Heart sounds: Normal heart sounds.  Pulmonary:     Effort: Pulmonary effort is normal.     Breath sounds: Normal breath sounds.   Abdominal:     General: Abdomen is flat. Bowel sounds are normal. There is no distension.     Palpations: Abdomen is soft.     Tenderness: There is no abdominal tenderness.  Musculoskeletal:        General: No swelling.     Cervical back: Neck supple.  Lymphadenopathy:     Cervical: No cervical adenopathy.  Skin:    General: Skin is warm and dry.     Findings: No rash.  Neurological:     General: No focal deficit present.     Mental Status: She is alert.  Psychiatric:        Mood and Affect: Mood normal.        Behavior: Behavior normal.   Bilateral breasts on 04/01/2022  LABORATORY DATA:  None today  RADIOGRAPHIC STUDIES:  [place] AXILLARY NODE CORE BIOPSY LEFT  Addendum Date: 04/06/2022   ADDENDUM REPORT: 04/06/2022 07:43 ADDENDUM: Pathology revealed GRADE III INVASIVE DUCTAL CARCINOMA, OTHER FINDINGS: GEOGRAPHIC NECROSIS of the LEFT breast, 2 o'clock, 4cmfn, (ribbon clip). This was found to be concordant by Dr. Lajean Manes. Pathology revealed METASTATIC BREAST CARCINOMA (WITH SIMILAR MORPHOLOGY TO THAT OF PART 1), NO DEFINITIVE NODAL TISSUE PRESENT IN THE BIOPSY of the LEFT axilla, (hydromark clip). This was found to be concordant by Dr. Lajean Manes. Pathology results were discussed with the patient by telephone using [employer]. The patient reported doing well after the biopsies with tenderness at the sites. Post biopsy instructions and care were reviewed and questions were answered. The patient was encouraged to call [facility] for any additional concerns. Medical oncology consultation has been arranged with Dr. Benay Pike at [facility] on April 06, 2022. Pathology results reported by Terie Purser, RN on 04/06/2022. Electronically Signed   By: Lajean Manes M.D.   On: 04/06/2022 07:43   Result Date: 04/06/2022 CLINICAL DATA:  Patient presents for ultrasound-guided core needle biopsy of 1 of several masses in the left breast as  well as 1 of several abnormal left axillary lymph nodes. She has known inflammatory left breast carcinoma and has been undergoing chemotherapy with progression of disease while under treatment. Current biopsies are to obtain additional tissue for testing. EXAM: ULTRASOUND GUIDED LEFT BREAST CORE NEEDLE  BIOPSY ULTRASOUND GUIDED LEFT AXILLARY LYMPH NODE CORE NEEDLE BIOPSY COMPARISON:  Previous exam(s). PROCEDURE: I met with the patient and we discussed the procedure of ultrasound-guided biopsy, including benefits and alternatives. We discussed the high likelihood of a successful procedure. We discussed the risks of the procedure, including infection, bleeding, tissue injury, clip migration, and inadequate sampling. Informed written consent was given. The usual time-out protocol was performed immediately prior to the procedure. Biopsy #1: Left breast mass at 2 o'clock, 4 cm from the nipple. Lesion quadrant: Upper outer quadrant. Using sterile technique and 1% Lidocaine as local anesthetic, under direct ultrasound visualization, a 12 gauge spring-loaded device was used to perform biopsy of the 1 of the masses in the upper outer left breast using an inferior approach. At the conclusion of the procedure a ribbon shaped tissue marker clip was deployed into the biopsy cavity. Biopsy #2: Abnormal left axillary lymph node. Lesion Location: Left axilla. Using sterile technique and 1% Lidocaine as local anesthetic, under direct ultrasound visualization, a 14 gauge spring-loaded device was used to perform biopsy of 1 of the larger of the abnormal left axillary lymph nodes using an inferolateral approach. At the conclusion of the procedure [facility] tissue marker clip was deployed into the biopsy cavity. Follow up 2 view mammogram was performed and dictated separately. IMPRESSION: Ultrasound guided biopsy of a left breast mass and abnormal left axillary lymph node. No apparent complications. Electronically Signed: By: Lajean Manes M.D. On: 04/04/2022 14:16   MM CLIP PLACEMENT LEFT  Result Date: 04/04/2022 CLINICAL DATA:  Assess post biopsy marker clip placement following ultrasound-guided core needle biopsy left breast mass and left axillary lymph node. EXAM: 3D DIAGNOSTIC LEFT MAMMOGRAM POST ULTRASOUND BIOPSY COMPARISON:  Previous exam(s). FINDINGS: 3D Mammographic images were obtained following ultrasound guided biopsy of the left breast and left axilla. The ribbon shaped biopsy clip is in the expected location the biopsied, 2 o'clock position, mass. [facility] biopsy clip is seen within rounded left axillary lymph node. IMPRESSION: Appropriate positioning of the ribbon shaped and HydroMARK biopsy marking clips at the site of biopsy in the upper outer left breast and left axilla respectively. Final Assessment: Post Procedure Mammograms for Marker Placement Electronically Signed   By: Lajean Manes M.D.   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  She was initially diagnosed with stage IIIc functionally triple negative breast cancer status post neoadjuvant chemotherapy with Adriamycin, Cytoxan x4 followed by Taxol carbo x12, left lumpectomy, and adjuvant radiation.  Laterrica was unable to tolerate capecitabine with her adjuvant radiation.  She was also unable to tolerate pembrolizumab which she started in March 14 but only was able to take 2 doses.  In April 2023 she began noting breast redness and pain she was put on antibiotics and scheduled for follow-up 1 week later which showed no improvement and skin biopsy arranged several days later confirmed breast cancer to the skin.    Dr. Lindi Adie sat down with [name] and our [place] interpreter [name] and discussed the situation including her quickly progressing breast cancer.  In review of her initial pathology where HER2 was 1+ she  is a candidate to receive Enhertu.  Dr. Lindi Adie discussed risks and benefits with her in detail.  May 9 prognostic panel was repeated, however tissue was small, therefore, she will also undergo breast biopsy and restaging  scans.  She will return on Tuesday and hopefully we will have treatment approved by then so she can proceed.   All questions were answered. The patient knows to call the clinic with any problems, questions or concerns. We can certainly see the patient much sooner if necessary.     Wilber Bihari, NP 04/06/22 1:30 PM Medical Oncology and Hematology [facility] [place], [place] [address] Tel. [phone]    Fax. [phone]  *Total Encounter Time as defined by the Centers for Medicare and Medicaid Services includes, in addition to the face-to-face time of a patient visit (documented in the note above) non-face-to-face time: obtaining and reviewing outside history, ordering and reviewing medications, tests or procedures, care coordination (communications with other health care professionals or caregivers) and documentation in the medical record.  Attending Note  I personally saw and examined [name]. The plan of care was discussed with her. I agree with the physical exam findings and assessment and plan as documented above. I performed the majority of the counseling and assessment and plan regarding this encounter -Rapidly progressing triple negative breast cancer which failed to respond to initial chemotherapy treatments and cutaneous lesions are extending along the chest wall. -Recommend switching her to Enhertu -Awaiting the results of her to testing on the subsequent biopsies.  Signed Harriette Ohara, MD

## 2022-04-04 ENCOUNTER — Other Ambulatory Visit: Payer: Self-pay | Admitting: *Deleted

## 2022-04-04 ENCOUNTER — Encounter: Payer: Self-pay | Admitting: Hematology and Oncology

## 2022-04-04 ENCOUNTER — Other Ambulatory Visit: Payer: Self-pay

## 2022-04-04 ENCOUNTER — Ambulatory Visit
Admission: RE | Admit: 2022-04-04 | Discharge: 2022-04-04 | Disposition: A | Discharge status: Home / Self Care | Payer: No Typology Code available for payment source | Source: Ambulatory Visit | Attending: Hematology and Oncology | Admitting: Hematology and Oncology

## 2022-04-04 ENCOUNTER — Other Ambulatory Visit: Payer: Self-pay | Admitting: Hematology and Oncology

## 2022-04-04 ENCOUNTER — Encounter: Payer: Self-pay | Admitting: Adult Health

## 2022-04-04 ENCOUNTER — Telehealth: Payer: Self-pay

## 2022-04-04 DIAGNOSIS — R928 Other abnormal and inconclusive findings on diagnostic imaging of breast: Secondary | ICD-10-CM

## 2022-04-04 DIAGNOSIS — C50912 Malignant neoplasm of unspecified site of left female breast: Secondary | ICD-10-CM

## 2022-04-04 DIAGNOSIS — C50412 Malignant neoplasm of upper-outer quadrant of left female breast: Secondary | ICD-10-CM

## 2022-04-04 DIAGNOSIS — Z853 Personal history of malignant neoplasm of breast: Secondary | ICD-10-CM

## 2022-04-04 NOTE — Progress Notes (Signed)
S/w [name] interpreter who will call pt to translate she needs enhertu sooner so we will get her in 04/06/22 at 0800 for labs, 0830 MD visit and 0930 infusion. [name] states she will call pt make pt aware.

## 2022-04-04 NOTE — Telephone Encounter (Signed)
Attempted to call [facility] to add prognostic panel for patient. LVM for Tiffany who is Dr Nira Retort assistant, questing call back.

## 2022-04-04 NOTE — Progress Notes (Signed)
LVM with [facility] flow cytometry to add HER2 new on recent punch bx. Mateo Flow, RN aware so she can f/u.

## 2022-04-04 NOTE — Progress Notes (Signed)
..  Patient is receiving Assistance Medication - Supplied Externally. Medication: Enhertu Manufacture: [employer] Approval Dates: Approved from 04/04/2022 until 04/04/2023. ID: [id number] Reason: Self Pay

## 2022-04-05 ENCOUNTER — Inpatient Hospital Stay: Payer: No Typology Code available for payment source

## 2022-04-05 ENCOUNTER — Telehealth: Payer: Self-pay | Admitting: *Deleted

## 2022-04-05 ENCOUNTER — Other Ambulatory Visit: Payer: Self-pay | Admitting: *Deleted

## 2022-04-05 ENCOUNTER — Inpatient Hospital Stay: Payer: No Typology Code available for payment source | Admitting: Adult Health

## 2022-04-05 NOTE — Telephone Encounter (Signed)
Requested STAT read/results for patient's recent path 7/24

## 2022-04-05 NOTE — Telephone Encounter (Signed)
This RN per MD request attempted to reach [place] interpreter- per need to push appts back for 7/25 due to probable results of bx yesterday and her 2 status not being available until later in day.  This RN obtained a VM- message left for [name] to return call to this RN.  Per discussion with charge nurse- explained situation of issue of which drug to treat pt with- enhertu or trodelvy which per medical necessity needs verification by biopspy done yesterday.  Appt for treatment pushed back to 11 am.  Post not receiving a call back from [name]- this RN attempted to call pt via [employer].  Verified VM obtained and detailed message left in spanish per pt informing of need to change appts so we can have her pathology back for best discussion of needed therapy.  Appts rescheduled by this RN with ok per MD for overbooking her.

## 2022-04-06 ENCOUNTER — Other Ambulatory Visit: Payer: Self-pay

## 2022-04-06 ENCOUNTER — Inpatient Hospital Stay: Payer: No Typology Code available for payment source

## 2022-04-06 ENCOUNTER — Other Ambulatory Visit (HOSPITAL_COMMUNITY): Payer: Self-pay

## 2022-04-06 ENCOUNTER — Encounter: Payer: Self-pay | Admitting: Hematology and Oncology

## 2022-04-06 ENCOUNTER — Encounter: Payer: Self-pay | Admitting: Adult Health

## 2022-04-06 ENCOUNTER — Encounter: Payer: Self-pay | Admitting: *Deleted

## 2022-04-06 ENCOUNTER — Inpatient Hospital Stay (HOSPITAL_BASED_OUTPATIENT_CLINIC_OR_DEPARTMENT_OTHER): Payer: Self-pay | Admitting: Hematology and Oncology

## 2022-04-06 DIAGNOSIS — Z170 Estrogen receptor positive status [ER+]: Secondary | ICD-10-CM

## 2022-04-06 DIAGNOSIS — C50412 Malignant neoplasm of upper-outer quadrant of left female breast: Secondary | ICD-10-CM

## 2022-04-06 DIAGNOSIS — Z95828 Presence of other vascular implants and grafts: Secondary | ICD-10-CM

## 2022-04-06 DIAGNOSIS — C50912 Malignant neoplasm of unspecified site of left female breast: Secondary | ICD-10-CM

## 2022-04-06 LAB — CBC WITH DIFFERENTIAL/PLATELET
Abs Immature Granulocytes: 0.01 10*3/uL (ref 0.00–0.07)
Basophils Absolute: 0 10*3/uL (ref 0.0–0.1)
Basophils Relative: 0 %
Eosinophils Absolute: 0 10*3/uL (ref 0.0–0.5)
Eosinophils Relative: 0 %
HCT: 34 % — ABNORMAL LOW (ref 36.0–46.0)
Hemoglobin: 11.5 g/dL — ABNORMAL LOW (ref 12.0–15.0)
Immature Granulocytes: 0 %
Lymphocytes Relative: 19 %
Lymphs Abs: 0.6 10*3/uL — ABNORMAL LOW (ref 0.7–4.0)
MCH: 28.3 pg (ref 26.0–34.0)
MCHC: 33.8 g/dL (ref 30.0–36.0)
MCV: 83.5 fL (ref 80.0–100.0)
Monocytes Absolute: 0 10*3/uL — ABNORMAL LOW (ref 0.1–1.0)
Monocytes Relative: 1 %
Neutro Abs: 2.3 10*3/uL (ref 1.7–7.7)
Neutrophils Relative %: 80 %
Platelets: 250 10*3/uL (ref 150–400)
RBC: 4.07 MIL/uL (ref 3.87–5.11)
RDW: 14.3 % (ref 11.5–15.5)
WBC: 2.9 10*3/uL — ABNORMAL LOW (ref 4.0–10.5)
nRBC: 0 % (ref 0.0–0.2)

## 2022-04-06 LAB — COMPREHENSIVE METABOLIC PANEL
ALT: 12 U/L (ref 0–44)
AST: 15 U/L (ref 15–41)
Albumin: 4.3 g/dL (ref 3.5–5.0)
Alkaline Phosphatase: 78 U/L (ref 38–126)
Anion gap: 7 (ref 5–15)
BUN: 13 mg/dL (ref 6–20)
CO2: 25 mmol/L (ref 22–32)
Calcium: 9.6 mg/dL (ref 8.9–10.3)
Chloride: 106 mmol/L (ref 98–111)
Creatinine, Ser: 0.61 mg/dL (ref 0.44–1.00)
GFR, Estimated: >60 mL/min (ref >60)
Glucose, Bld: 161 mg/dL — ABNORMAL HIGH (ref 70–99)
Potassium: 3.9 mmol/L (ref 3.5–5.1)
Sodium: 138 mmol/L (ref 135–145)
Total Bilirubin: 0.4 mg/dL (ref 0.3–1.2)
Total Protein: 7.6 g/dL (ref 6.5–8.1)

## 2022-04-06 MED ORDER — HEPARIN SOD (PORK) LOCK FLUSH 100 UNIT/ML IV SOLN
500.0000 [IU] | Freq: Once | INTRAVENOUS | Status: AC | PRN
  Administered 2022-04-06: 500 [IU]

## 2022-04-06 MED ORDER — ACETAMINOPHEN 325 MG PO TABS
650.0000 mg | ORAL_TABLET | Freq: Once | ORAL | Status: AC
  Administered 2022-04-06: 650 mg via ORAL
  Filled 2022-04-06: qty 2

## 2022-04-06 MED ORDER — SODIUM CHLORIDE 0.9 % IV SOLN
10.0000 mg | Freq: Once | INTRAVENOUS | Status: AC
  Administered 2022-04-06: 10 mg via INTRAVENOUS
  Filled 2022-04-06: qty 10

## 2022-04-06 MED ORDER — DIPHENHYDRAMINE HCL 25 MG PO CAPS
50.0000 mg | ORAL_CAPSULE | Freq: Once | ORAL | Status: AC
  Administered 2022-04-06: 50 mg via ORAL
  Filled 2022-04-06: qty 2

## 2022-04-06 MED ORDER — FAM-TRASTUZUMAB DERUXTECAN-NXKI CHEMO 100 MG IV SOLR
5.4000 mg/kg | Freq: Once | INTRAVENOUS | Status: AC
  Administered 2022-04-06: 462 mg via INTRAVENOUS
  Filled 2022-04-06: qty 23.1

## 2022-04-06 MED ORDER — PALONOSETRON HCL INJECTION 0.25 MG/5ML
0.2500 mg | Freq: Once | INTRAVENOUS | Status: AC
  Administered 2022-04-06: 0.25 mg via INTRAVENOUS
  Filled 2022-04-06: qty 5

## 2022-04-06 MED ORDER — SODIUM CHLORIDE 0.9% FLUSH
10.0000 mL | Freq: Once | INTRAVENOUS | Status: AC
  Administered 2022-04-06: 10 mL

## 2022-04-06 MED ORDER — SODIUM CHLORIDE 0.9% FLUSH
10.0000 mL | INTRAVENOUS | Status: DC | PRN
  Administered 2022-04-06: 10 mL

## 2022-04-06 MED ORDER — VANCOMYCIN HCL 10 G IV SOLR
2000.0000 mg | Freq: Once | INTRAVENOUS | Status: DC
  Filled 2022-04-06: qty 20

## 2022-04-06 MED ORDER — DOXYCYCLINE HYCLATE 100 MG PO TABS
100.0000 mg | ORAL_TABLET | Freq: Two times a day (BID) | ORAL | 0 refills | Status: AC
  Filled 2022-04-06: qty 14, 7d supply, fill #0

## 2022-04-06 MED ORDER — VANCOMYCIN HCL 1 G IV SOLR
2000.0000 mg | Freq: Once | INTRAVENOUS | Status: AC
  Administered 2022-04-06: 2000 mg via INTRAVENOUS
  Filled 2022-04-06: qty 40

## 2022-04-06 MED ORDER — DEXTROSE 5 % IV SOLN: Freq: Once | INTRAVENOUS | Status: AC

## 2022-04-06 NOTE — Progress Notes (Signed)
[facility] Follow up:    [name] I, NP No address on file   DIAGNOSIS:  Cancer Staging  Malignant neoplasm of upper-outer quadrant of left breast in female, estrogen receptor positive ([place]) Staging form: Breast, AJCC 8th Edition - Clinical stage from 01/29/2020: Stage IIIA (cT2, cN1, cM0, G3, ER+, PR-, HER2-) - Unsigned Stage prefix: Initial diagnosis Histologic grading system: 3 grade system - Pathologic stage from 07/15/2020: No Stage Recommended (ypT1b, pN0, cM0, G3, ER-, PR-, HER2-) - Signed by Gardenia Phlegm, NP on 04/01/2022 Stage prefix: Post-therapy Histologic grading system: 3 grade system   SUMMARY OF ONCOLOGIC HISTORY: Oncology History  Malignant neoplasm of upper-outer quadrant of left breast in female, estrogen receptor positive ([place])  01/23/2020 Initial Diagnosis   [place] woman status post left breast upper outer quadrant biopsy 01/23/2020 for a clinical T3 N1, 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             (a) chest CT scan and bone scan 02/07/2020 showed no evidence of metastatic disease   02/07/2020 Genetic Testing   Negative genetic testing. No pathogenic variants identified on the [facility] Common Hereditary Cancers Panel. VUS in MSH6 called c.2744C>G identified. The report date is 02/07/2020.  The Common Hereditary Cancers Panel offered by [facility] includes sequencing and/or deletion duplication testing of the following 48 genes: APC, ATM, AXIN2, BARD1, BMPR1A, BRCA1, BRCA2, BRIP1, CDH1, CDKN2A (p14ARF), CDKN2A (p16INK4a), CKD4, CHEK2, CTNNA1, DICER1, EPCAM (Deletion/duplication testing only), GREM1 (promoter region deletion/duplication testing only), KIT, MEN1, MLH1, MSH2, MSH3, MSH6, MUTYH, NBN, NF1, NHTL1, PALB2, PDGFRA, PMS2, POLD1, POLE, PTEN, RAD50, RAD51C, RAD51D, RNF43, SDHB, SDHC, SDHD, SMAD4, SMARCA4. STK11, TP53, TSC1, TSC2, and VHL.  The following genes were  evaluated for sequence changes only: SDHA and HOXB13 c.251G>A variant only.   02/12/2020 - 05/28/2020 Neo-Adjuvant Chemotherapy   neoadjuvant chemotherapy consisting of doxorubicin and cyclophosphamide in dose dense fashion x4 started 02/12/2020, completed 03/25/2020, followed by paclitaxel and carboplatin weekly x12 started 04/08/2020 and completed on 05/28/2020             (a) echo 02/07/2020 shows an ejection fraction in the 55-60% range             (b) Epirubicin substituted for Doxorubicin starting with cycle 2 of AC secondary to allergic rash from Doxorubicin   07/15/2020 Surgery   status post left lumpectomy and sentinel lymph node sampling 07/15/2020 for a ypT1b ypN0 residual invasive ductal carcinoma, grade 3, with negative margins.             (a) a total of 4 left axillary lymph nodes were removed             (b) repeat prognostic panel on the final pathology again triple negative, with an MIB-1 of 50%.   07/15/2020 Cancer Staging   Staging form: Breast, AJCC 8th Edition - Pathologic stage from 07/15/2020: No Stage Recommended (ypT1b, pN0, cM0, G3, ER-, PR-, HER2-) - Signed by Gardenia Phlegm, NP on 04/01/2022 Stage prefix: Post-therapy Histologic grading system: 3 grade system   08/26/2020 - 10/09/2020 Radiation Therapy   adjuvant radiation completed 10/09/2020             (a) sensitizing capecitabine attempted but not tolerated by the patient  08/26/2020 through 10/09/2020 Site Technique Total Dose (Gy) Dose per Fx (Gy) Completed Fx Beam Energies  Breast, Left: Breast_Lt 3D 50/50 2 25/25 15X  Breast, Left: Breast_Lt_PAB_SCV 3D 50/50  2 25/25 10X, 15X  Breast, Left: Breast_Lt_Bst 3D 10/10 2 5/5 6X, 10X    11/23/2020 - 01/04/2021 Adjuvant Chemotherapy   adjuvant pembrolizumab started 11/23/2020, to continue for 1 year             (a) discontinued after 01/04/2021 dose with multiple side effects requiring steroids for resolution   03/30/2022 Mammogram   Mammogram and ultrasound  show numerous masses throughout left breast not visualized in 01/2022, +left axillary lymphadenopathy, skin thickening in right breast and in upper abdomen just below sternum   04/05/2022 -  Chemotherapy   Patient is on Treatment Plan : BREAST METASTATIC fam-trastuzumab deruxtecan-nxki (Enhertu) q21d     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  Prognostic panel sent to [facility]: Left breast skin, punch biopsy: Skin with dermal lymphatic involvement by carcinoma (confirmed with outside CK7 IHC, reviewed). Biomarkers are reported as negative for ER, PR, Her2/neu (confirmed on review but please note sample size is extremely small).   02/15/2022 - 03/08/2022 Chemotherapy    Taxotere/Cytoxan x 2, discontinued due to progression of breast.   03/30/2022 Mammogram   Mammogram and ultrasound show numerous masses throughout left breast not visualized in 01/2022, +left axillary lymphadenopathy, skin thickening in right breast and in upper abdomen just below sternum   04/05/2022 -  Chemotherapy   Patient is on Treatment Plan : BREAST METASTATIC fam-trastuzumab deruxtecan-nxki (Enhertu) q21d      CURRENT THERAPY: progression on Taxotere and cytoxan  INTERVAL HISTORY:  [name] 46 y.o. female returns for follow-up of her breast cancer.    She was recently seen by Mendel Ryder our APP in clinic after imaging which suggested progression. Enhertu was recommended since the original specimen was Her 2, 1+, however most recent prognostics didn't show any her 2 expression was labelled as Her 2 "0". She is here to discuss further recommendations after repeat biopsy. Repeat biopsy showed Her 2 1+, hence she is here to follow up with [place] before Enhertu. Since last visit, she complains of some new onset headaches and redness in the middle of the chest as well as some in the right breast. She denies any chest pain, chest  pressure, shortness of breath today. No lower extremity swelling. 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[place]) 04/01/2022   Encounter for dental examination 03/11/2022   Acute apical periodontitis 03/11/2022   Phobia of dental procedure 03/11/2022   Defective dental restoration 03/11/2022   Accretions on teeth 03/11/2022   Chronic periodontitis 03/11/2022   Teeth missing 03/11/2022   Caries 03/11/2022   Generalized gingival recession 03/11/2022   Pain, dental 03/08/2022   Cancer-related pain 03/08/2022   Port-A-Cath in place 02/15/2022   Oral allergy syndrome, subsequent encounter 02/03/2022   Adverse effect of other drugs, medicaments and biological substances, subsequent encounter 02/02/2022   Other adverse food reactions, not elsewhere classified, subsequent encounter 02/02/2022   Chest tightness 02/02/2022   Other allergic rhinitis 02/02/2022   Allergic conjunctivitis of both eyes 02/02/2022   Language barrier 12/11/2020   Morbid obesity ([place])    History of breast cancer    Genetic testing 02/13/2020   Malignant neoplasm of upper-outer quadrant of left breast in female, estrogen receptor positive ([place]) 01/27/2020   Impaired ability to use community resources due to language barrier  07/13/2013    is allergic to omnipaque [iohexol], doxorubicin hcl, keytruda [pembrolizumab], other, and tape.  MEDICAL HISTORY: Past Medical History:  Diagnosis Date   Allergy    Asthma    Breast cancer in female [facility])    Left   Breast disorder    breast cancer May 2021   Chronic back pain    History of breast cancer    History of COVID-19 04/20/2021   Hx of migraines    Morbid obesity ([place])    Personal history of chemotherapy    Personal history of radiation therapy     SURGICAL HISTORY: Past Surgical History:  Procedure Laterality Date   BREAST LUMPECTOMY     BREAST  LUMPECTOMY WITH RADIOACTIVE SEED AND SENTINEL LYMPH NODE BIOPSY Left 07/15/2020   Procedure: LEFT BREAST LUMPECTOMY WITH RADIOACTIVE SEED AND LEFT AXILLARY SENTINEL LYMPH NODE BIOPSY, LEFT AXILLARY NODE RADIOACTIVE SEED GUIDED EXCISION, LEFT BREAST RADIOACTIVE SEED GUIDED EXCISION IM NODE;  Surgeon: Rolm Bookbinder, MD;  Location: [facility];  Service: General;  Laterality: Left;  PEC BLOCK   CESAREAN SECTION WITH BILATERAL TUBAL LIGATION Bilateral 07/23/2015   Procedure: CESAREAN SECTION WITH BILATERAL TUBAL LIGATION;  Surgeon: Donnamae Jude, MD;  Location: [facility];  Service: Obstetrics;  Laterality: Bilateral;   IR IMAGING GUIDED PORT INSERTION  02/11/2022   PORT-A-CATH REMOVAL N/A 04/30/2021   Procedure: REMOVAL PORT-A-CATH;  Surgeon: Rolm Bookbinder, MD;  Location: [facility];  Service: General;  Laterality: N/A;   PORTACATH PLACEMENT Right 02/11/2020   Procedure: INSERTION PORT-A-CATH WITH ULTRASOUND GUIDANCE;  Surgeon: Rolm Bookbinder, MD;  Location: [facility];  Service: General;  Laterality: Right;   TUBAL LIGATION      SOCIAL HISTORY: Social History   Socioeconomic History   Marital status: Legally Separated    Spouse name: Not on file   Number of children: 3   Years of education: Not on file   Highest education level: 9th grade  Occupational History   Occupation: Architect  Tobacco Use   Smoking status: Never   Smokeless tobacco: Never  Vaping Use   Vaping Use: Never used  Substance and Sexual Activity   Alcohol use: No   Drug use: Never   Sexual activity: Yes    Birth control/protection: Surgical  Other Topics Concern   Not on file  Social History Narrative   Not on file   Social Determinants of Health   Financial Resource Strain: Not on file  Food Insecurity: No Food Insecurity (03/29/2022)   Hunger Vital Sign    Worried About Running Out of Food in the Last Year: Never true    Ran Out of Food in the Last Year: Never true   Transportation Needs: No Transportation Needs (03/29/2022)   PRAPARE - Hydrologist (Medical): No    Lack of Transportation (Non-Medical): No  Physical Activity: Not on file  Stress: Not on file  Social Connections: Not on file  Intimate Partner Violence: Not on file    FAMILY HISTORY: Family History  Problem Relation Age of Onset   Heart disease Maternal Grandmother     Review of Systems  Constitutional:  Negative for appetite change, chills, fatigue, fever and unexpected weight change.  HENT:   Negative for hearing loss, lump/mass and trouble swallowing.   Eyes:  Negative for eye problems and icterus.  Respiratory:  Negative for chest tightness, cough and shortness of breath.   Cardiovascular:  Negative for chest pain,  leg swelling and palpitations.  Gastrointestinal:  Negative for abdominal distention, abdominal pain, constipation, diarrhea, nausea and vomiting.  Endocrine: Negative for hot flashes.  Genitourinary:  Negative for difficulty urinating.   Musculoskeletal:  Negative for arthralgias.  Skin:  Negative for itching and rash.  Neurological:  Negative for dizziness, extremity weakness, headaches and numbness.  Hematological:  Negative for adenopathy. Does not bruise/bleed easily.  Psychiatric/Behavioral:  Negative for depression. The patient is not nervous/anxious.       PHYSICAL EXAMINATION  ECOG PERFORMANCE STATUS: 1 - Symptomatic but completely ambulatory  Vitals:   04/06/22 1057  BP: 121/76  Pulse: 90  Resp: 18  Temp: (!) 97.4 F (36.3 C)  SpO2: 98%     Physical Exam Constitutional:      General: She is not in acute distress.    Appearance: Normal appearance. She is not toxic-appearing.  HENT:     Head: Normocephalic and atraumatic.  Eyes:     General: No scleral icterus. Cardiovascular:     Rate and Rhythm: Normal rate and regular rhythm.     Pulses: Normal pulses.     Heart sounds: Normal heart sounds.   Pulmonary:     Effort: Pulmonary effort is normal.     Breath sounds: Normal breath sounds.  Chest:     Comments: Please refer to pictures in media.  Increasing erythema over the left breast extending now into the midline and to the medial aspect of the right breast. Abdominal:     General: Abdomen is flat. Bowel sounds are normal. There is no distension.     Palpations: Abdomen is soft.     Tenderness: There is no abdominal tenderness.  Musculoskeletal:        General: No swelling.     Cervical back: Neck supple.  Lymphadenopathy:     Cervical: No cervical adenopathy.  Skin:    General: Skin is warm and dry.     Findings: No rash.  Neurological:     General: No focal deficit present.     Mental Status: She is alert.  Psychiatric:        Mood and Affect: Mood normal.        Behavior: Behavior normal.     Media Information  04/03/2022   Document Information  Photos  04/06/2022  04/06/2022 11:27  Attached To:  Office Visit on 04/06/22 with Benay Pike, MD   Source Information  Benay Pike, MD  [facility]    LABORATORY DATA:  None today  RADIOGRAPHIC STUDIES:  [place] LT BREAST BX W LOC DEV 1ST LESION IMG BX SPEC US GUIDE  Addendum Date: 04/06/2022   ADDENDUM REPORT: 04/06/2022 07:43 ADDENDUM: Pathology revealed GRADE III INVASIVE DUCTAL CARCINOMA, OTHER FINDINGS: GEOGRAPHIC NECROSIS of the LEFT breast, 2 o'clock, 4cmfn, (ribbon clip). This was found to be concordant by Dr. Lajean Manes. Pathology revealed METASTATIC BREAST CARCINOMA (WITH SIMILAR MORPHOLOGY TO THAT OF PART 1), NO DEFINITIVE NODAL TISSUE PRESENT IN THE BIOPSY of the LEFT axilla, (hydromark clip). This was found to be concordant by Dr. Lajean Manes. Pathology results were discussed with the patient by telephone using [employer]. The patient reported doing well after the biopsies with tenderness at the sites. Post biopsy instructions and care were reviewed and questions were answered.  The patient was encouraged to call [facility] for any additional concerns. Medical oncology consultation has been arranged with Dr. Benay Pike at [facility] on April 06, 2022. Pathology  results reported by Terie Purser, RN on 04/06/2022. Electronically Signed   By: Lajean Manes M.D.   On: 04/06/2022 07:43   Result Date: 04/06/2022 CLINICAL DATA:  Patient presents for ultrasound-guided core needle biopsy of 1 of several masses in the left breast as well as 1 of several abnormal left axillary lymph nodes. She has known inflammatory left breast carcinoma and has been undergoing chemotherapy with progression of disease while under treatment. Current biopsies are to obtain additional tissue for testing. EXAM: ULTRASOUND GUIDED LEFT BREAST CORE NEEDLE BIOPSY ULTRASOUND GUIDED LEFT AXILLARY LYMPH NODE CORE NEEDLE BIOPSY COMPARISON:  Previous exam(s). PROCEDURE: I met with the patient and we discussed the procedure of ultrasound-guided biopsy, including benefits and alternatives. We discussed the high likelihood of a successful procedure. We discussed the risks of the procedure, including infection, bleeding, tissue injury, clip migration, and inadequate sampling. Informed written consent was given. The usual time-out protocol was performed immediately prior to the procedure. Biopsy #1: Left breast mass at 2 o'clock, 4 cm from the nipple. Lesion quadrant: Upper outer quadrant. Using sterile technique and 1% Lidocaine as local anesthetic, under direct ultrasound visualization, a 12 gauge spring-loaded device was used to perform biopsy of the 1 of the masses in the upper outer left breast using an inferior approach. At the conclusion of the procedure a ribbon shaped tissue marker clip was deployed into the biopsy cavity. Biopsy #2: Abnormal left axillary lymph node. Lesion Location: Left axilla. Using sterile technique and 1% Lidocaine as local anesthetic, under direct  ultrasound visualization, a 14 gauge spring-loaded device was used to perform biopsy of 1 of the larger of the abnormal left axillary lymph nodes using an inferolateral approach. At the conclusion of the procedure [facility] tissue marker clip was deployed into the biopsy cavity. Follow up 2 view mammogram was performed and dictated separately. IMPRESSION: Ultrasound guided biopsy of a left breast mass and abnormal left axillary lymph node. No apparent complications. Electronically Signed: By: Lajean Manes M.D. On: 04/04/2022 14:16   [place] AXILLARY NODE CORE BIOPSY LEFT  Addendum Date: 04/06/2022   ADDENDUM REPORT: 04/06/2022 07:43 ADDENDUM: Pathology revealed GRADE III INVASIVE DUCTAL CARCINOMA, OTHER FINDINGS: GEOGRAPHIC NECROSIS of the LEFT breast, 2 o'clock, 4cmfn, (ribbon clip). This was found to be concordant by Dr. Lajean Manes. Pathology revealed METASTATIC BREAST CARCINOMA (WITH SIMILAR MORPHOLOGY TO THAT OF PART 1), NO DEFINITIVE NODAL TISSUE PRESENT IN THE BIOPSY of the LEFT axilla, (hydromark clip). This was found to be concordant by Dr. Lajean Manes. Pathology results were discussed with the patient by telephone using [employer]. The patient reported doing well after the biopsies with tenderness at the sites. Post biopsy instructions and care were reviewed and questions were answered. The patient was encouraged to call [facility] for any additional concerns. Medical oncology consultation has been arranged with Dr. Benay Pike at [facility] on April 06, 2022. Pathology results reported by Terie Purser, RN on 04/06/2022. Electronically Signed   By: Lajean Manes M.D.   On: 04/06/2022 07:43   Result Date: 04/06/2022 CLINICAL DATA:  Patient presents for ultrasound-guided core needle biopsy of 1 of several masses in the left breast as well as 1 of several abnormal left axillary lymph nodes. She has known inflammatory left breast carcinoma and has been  undergoing chemotherapy with progression of disease while under treatment. Current biopsies are to obtain additional tissue for testing. EXAM: ULTRASOUND GUIDED LEFT BREAST CORE NEEDLE BIOPSY ULTRASOUND GUIDED  LEFT AXILLARY LYMPH NODE CORE NEEDLE BIOPSY COMPARISON:  Previous exam(s). PROCEDURE: I met with the patient and we discussed the procedure of ultrasound-guided biopsy, including benefits and alternatives. We discussed the high likelihood of a successful procedure. We discussed the risks of the procedure, including infection, bleeding, tissue injury, clip migration, and inadequate sampling. Informed written consent was given. The usual time-out protocol was performed immediately prior to the procedure. Biopsy #1: Left breast mass at 2 o'clock, 4 cm from the nipple. Lesion quadrant: Upper outer quadrant. Using sterile technique and 1% Lidocaine as local anesthetic, under direct ultrasound visualization, a 12 gauge spring-loaded device was used to perform biopsy of the 1 of the masses in the upper outer left breast using an inferior approach. At the conclusion of the procedure a ribbon shaped tissue marker clip was deployed into the biopsy cavity. Biopsy #2: Abnormal left axillary lymph node. Lesion Location: Left axilla. Using sterile technique and 1% Lidocaine as local anesthetic, under direct ultrasound visualization, a 14 gauge spring-loaded device was used to perform biopsy of 1 of the larger of the abnormal left axillary lymph nodes using an inferolateral approach. At the conclusion of the procedure [facility] tissue marker clip was deployed into the biopsy cavity. Follow up 2 view mammogram was performed and dictated separately. IMPRESSION: Ultrasound guided biopsy of a left breast mass and abnormal left axillary lymph node. No apparent complications. Electronically Signed: By: Lajean Manes M.D. On: 04/04/2022 14:16   MM CLIP PLACEMENT LEFT  Result Date: 04/04/2022 CLINICAL DATA:  Assess post biopsy  marker clip placement following ultrasound-guided core needle biopsy left breast mass and left axillary lymph node. EXAM: 3D DIAGNOSTIC LEFT MAMMOGRAM POST ULTRASOUND BIOPSY COMPARISON:  Previous exam(s). FINDINGS: 3D Mammographic images were obtained following ultrasound guided biopsy of the left breast and left axilla. The ribbon shaped biopsy clip is in the expected location the biopsied, 2 o'clock position, mass. The [facility] biopsy clip is seen within rounded left axillary lymph node. IMPRESSION: Appropriate positioning of the ribbon shaped and [employer] biopsy marking clips at the site of biopsy in the upper outer left breast and left axilla respectively. Final Assessment: Post Procedure Mammograms for Marker Placement Electronically Signed   By: Lajean Manes M.D.   On: 04/04/2022 14:34     ASSESSMENT and THERAPY PLAN:   Malignant neoplasm of upper-outer quadrant of left breast in female, estrogen receptor positive ([place]) Bell[place]ie is a 46 year old Spanish-speaking woman who has a 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  She was most recently seen for ongoing breast redness, given a course of antibiotics but had persistent redness hence we proceeded with a punch biopsy.  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  Pathology was sent to [facility] for second opinion as well. This confirmed triple negative breast cancer.  We have recommended neoadjuvant chemotherapy with TC every 21 days for 4 cycles followed by adjuvant xeloda. She says she couldn't tolerate [place] well at all, had intractable cough, sob and skin rash.  She received 2 cycles of TC.    During her last visit, we have discussed about considering surgery, mastectomy, axillary lymph node dissection since she was not responding well to neoadjuvant chemotherapy.  She since then has followed up with Dr. Donne Hazel who did not  recommend surgery given progressive disease.  She was then seen by our APP Wilber Bihari and saw Dr. Lindi Adie briefly while I was  away and recommendation was to consider and HER2 if the HER2 is at least 1+ on IHC staining.  She initially had HER2 IHC 1+ back in 2021 but her most recent prognostic showed her 2 0 hence we did not offer this to her back in May.  Also when we started her treatment in May we were hoping for a definitive surgery hence did not discuss about Enhertu since this was not standard of care.  But now since she has exhausted chemotherapy options and since she did not tolerate the Keytruda well in the past, we have discussed about using Enhertu in the setting of unresectable inflammatory breast cancer.  She just had repeat biopsies and have had a discussion with Dr. Alric Seton from pathology who confirmed that she has weak ER staining, PR negative, HER2 1+ by IHC and KIA index of 80%.  We will get her started on Enhertu as planned today.  We will get an echo done as soon as possible, since she has a rapidly proliferating tumor we have made a clinical decision to move forward with Enhertu as soon as possible and do echo in the next few days (she is clinically asymptomatic from cardiac standpoint) she also has underlying osteoporosis which will have to be treated by bisphosphonates however does not have any insurance hence cannot obtain dental clearance at this time.  We will consider treating this at a later date.   All questions were answered. The patient knows to call the clinic with any problems, questions or concerns. We can certainly see the patient much sooner if necessary.  Total time spent: 45 minutes   *Total Encounter Time as defined by the Centers for Medicare and Medicaid Services includes, in addition to the face-to-face time of a patient visit (documented in the note above) non-face-to-face time: obtaining and reviewing outside history, ordering and reviewing medications,  tests or procedures, care coordination (communications with other health care professionals or caregivers) and documentation in the medical record.

## 2022-04-06 NOTE — Patient Instructions (Signed)
[facility]  Discharge Instructions: Thank you for choosing [facility] to provide your oncology and hematology care.   If you have a lab appointment with the [facility], please go directly to the [facility] and check in at the registration area.   Wear comfortable clothing and clothing appropriate for easy access to any Portacath or PICC line.   We strive to give you quality time with your provider. You may need to reschedule your appointment if you arrive late (15 or more minutes).  Arriving late affects you and other patients whose appointments are after yours.  Also, if you miss three or more appointments without notifying the office, you may be dismissed from the clinic at the provider's discretion.      For prescription refill requests, have your pharmacy contact our office and allow 72 hours for refills to be completed.    Today you received the following chemotherapy and/or immunotherapy agents: Enhertu    To help prevent nausea and vomiting after your treatment, we encourage you to take your nausea medication as directed.  BELOW ARE SYMPTOMS THAT SHOULD BE REPORTED IMMEDIATELY: *FEVER GREATER THAN 100.4 F (38 C) OR HIGHER *CHILLS OR SWEATING *NAUSEA AND VOMITING THAT IS NOT CONTROLLED WITH YOUR NAUSEA MEDICATION *UNUSUAL SHORTNESS OF BREATH *UNUSUAL BRUISING OR BLEEDING *URINARY PROBLEMS (pain or burning when urinating, or frequent urination) *BOWEL PROBLEMS (unusual diarrhea, constipation, pain near the anus) TENDERNESS IN MOUTH AND THROAT WITH OR WITHOUT PRESENCE OF ULCERS (sore throat, sores in mouth, or a toothache) UNUSUAL RASH, SWELLING OR PAIN  UNUSUAL VAGINAL DISCHARGE OR ITCHING   Items with * indicate a potential emergency and should be followed up as soon as possible or go to the Emergency Department if any problems should occur.  Please show the CHEMOTHERAPY ALERT CARD or IMMUNOTHERAPY ALERT CARD at check-in to the  Emergency Department and triage nurse.  Should you have questions after your visit or need to cancel or reschedule your appointment, please contact [facility]  Dept: [phone]  and follow the prompts.  Office hours are 8:00 a.m. to 4:30 p.m. Monday - Friday. Please note that voicemails left after 4:00 p.m. may not be returned until the following business day.  We are closed weekends and major holidays. You have access to a nurse at all times for urgent questions. Please call the main number to the clinic Dept: [phone] and follow the prompts.   For any non-urgent questions, you may also contact your provider using MyChart. We now offer e-Visits for anyone 40 and older to request care online for non-urgent symptoms. For details visit [email or web address].   Also download the MyChart app! Go to the app store, search "MyChart", open the app, select [facility], and log in with your MyChart username and password.  Masks are optional in the cancer centers. If you would like for your care team to wear a mask while they are taking care of you, please let them know. For doctor visits, patients may have with them one support person who is at least 46 years old. At this time, visitors are not allowed in the infusion area.

## 2022-04-06 NOTE — Patient Instructions (Signed)
Vancomycin injection What is this medication? VANCOMYCIN Lucianne Lei koe MYE sin) is a glycopeptide antibiotic. It is used to treat certain kinds of bacterial infections. It will not work for colds, flu, or other viral infections. This medicine may be used for other purposes; ask your health care provider or pharmacist if you have questions. COMMON BRAND NAME(S): Vancocin, Vancocin Powder, VANCOSOL What should I tell my care team before I take this medication? They need to know if you have any of these conditions: dehydration hearing loss kidney disease other chronic illness an unusual or allergic reaction to vancomycin, other medicines, foods, dyes, or preservatives pregnant or trying to get pregnant breast-feeding How should I use this medication? This medicine is infused into a vein. It is usually given by a health care provider in a hospital or clinic. If you receive this medicine at home, you will receive special instructions. Take your medicine at regular intervals. Do not take your medicine more often than directed. Take all of your medicine as directed even if you think you are better. Do not skip doses or stop your medicine early. It is important that you put your used needles and syringes in a special sharps container. Do not put them in a trash can. If you do not have a sharps container, call your pharmacist or healthcare provider to get one. Talk to your pediatrician regarding the use of this medicine in children. While this drug may be prescribed for even very young infants for selected conditions, precautions do apply. Overdosage: If you think you have taken too much of this medicine contact a poison control center or emergency room at once. NOTE: This medicine is only for you. Do not share this medicine with others. What if I miss a dose? If you miss a dose, take it as soon as you can. If it is almost time for your next dose, take only that dose. Do not take double or extra doses. What  may interact with this medication? amphotericin B anesthetics bacitracin birth control pills cisplatin colistin diuretics other aminoglycoside antibiotics polymyxin B This list may not describe all possible interactions. Give your health care provider a list of all the medicines, herbs, non-prescription drugs, or dietary supplements you use. Also tell them if you smoke, drink alcohol, or use illegal drugs. Some items may interact with your medicine. What should I watch for while using this medication? Tell your doctor or health care provider if your symptoms do not improve or if you get new symptoms. Your condition and lab work will be monitored while you are taking this medicine. Do not treat diarrhea with over the counter products. Contact your doctor if you have diarrhea that lasts more than 2 days or if it is severe and watery. This medicine may cause serious skin reactions. They can happen weeks to months after starting the medicine. Contact your health care provider right away if you notice fevers or flu-like symptoms with a rash. The rash may be red or purple and then turn into blisters or peeling of the skin. Or, you might notice a red rash with swelling of the face, lips or lymph nodes in your neck or under your arms. What side effects may I notice from receiving this medication? Side effects that you should report to your doctor or health care professional as soon as possible: allergic reactions like skin rash, itching or hives, swelling of the face, lips, or tongue breathing difficulty, wheezing change in amount, color of urine change in  hearing chest pain dizziness fever, chills flushing of the face and neck (reddening) low blood pressure rash, fever, and swollen lymph nodes redness, blistering, peeling or loosening of the skin, including inside the mouth unusual bleeding or bruising unusually weak or tired Side effects that usually do not require medical attention (report  to your doctor or health care professional if they continue or are bothersome): nausea, vomiting pain, swelling where injected stomach cramps This list may not describe all possible side effects. Call your doctor for medical advice about side effects. You may report side effects to FDA at 1-800-FDA-1088. Where should I keep my medication? Keep out of the reach of children. You will be instructed on how to store this medicine, if needed. Throw away any unused medicine after the expiration date on the label. NOTE: This sheet is a summary. It may not cover all possible information. If you have questions about this medicine, talk to your doctor, pharmacist, or health care provider.  2023 Elsevier/Gold Standard (2018-12-14 00:00:00)

## 2022-04-06 NOTE — Assessment & Plan Note (Addendum)
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  She was most recently seen for ongoing breast redness, given a course of antibiotics but had persistent redness hence we proceeded with a punch biopsy.  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  Pathology was sent to [facility] for second opinion as well. This confirmed triple negative breast cancer.  We have recommended neoadjuvant chemotherapy with TC every 21 days for 4 cycles followed by adjuvant xeloda. She says she couldn't tolerate [place] well at all, had intractable cough, sob and skin rash.  She received 2 cycles of TC.    During her last visit, we have discussed about considering surgery, mastectomy, axillary lymph node dissection since she was not responding well to neoadjuvant chemotherapy.  She since then has followed up with [name] who did not recommend surgery given progressive disease.  She was then seen by our APP [name] and saw [name] briefly while I was away and recommendation was to consider Enhertu if the HER2 is at least 1+ on IHC staining.  She initially had HER2 IHC 1+ back in 2021 but her most recent prognostic showed her 2 0 hence we did not offer this to her back in May.  Also when we started her treatment in May we were hoping for a definitive surgery hence did not discuss about Enhertu . But now since she has exhausted chemotherapy options and since she did not tolerate the Keytruda well in the past, we have discussed about using Enhertu in the setting of unresectable inflammatory breast cancer.  She just had repeat biopsies and have had a discussion with [name] from pathology who confirmed that she has weak ER staining, PR negative, HER2 1+ by IHC and KIAindex of 80%.  We will get her started on Enhertu as planned today.  We will get an echo  done as soon as possible, since she has a rapidly proliferating tumor we have made a clinical decision to move forward with Enhertu  Today, ECHO is scheduled tomorrow(she is clinically asymptomatic from cardiac standpoint, she is agreeable to this approach).  She also has underlying osteoporosis which will have to be treated by bisphosphonates, will need dental clearance, Will start this at a later time.

## 2022-04-06 NOTE — Progress Notes (Signed)
Per MD, orders entered for Vancomycin 2,'000mg'$  IV once for worsening redness to left breast; suspected for cellulitis. Pt noticed breast redness worsened throughout the day.  MD also ordered Doxycycline 100 mg 1 tab BID for 7 days for pt to start 04/07/22.

## 2022-04-06 NOTE — Progress Notes (Signed)
Per Dr Chryl Heck, pt may proceed today with Enhertu without initial echo as this is an emergent start. Pt is scheduled for echo 04/07/2022.

## 2022-04-07 ENCOUNTER — Telehealth: Payer: Self-pay | Admitting: Hematology and Oncology

## 2022-04-07 ENCOUNTER — Ambulatory Visit (HOSPITAL_COMMUNITY)
Admission: RE | Admit: 2022-04-07 | Discharge: 2022-04-07 | Disposition: A | Discharge status: Home / Self Care | Payer: Self-pay | Source: Ambulatory Visit | Attending: Adult Health | Admitting: Adult Health

## 2022-04-07 ENCOUNTER — Telehealth: Payer: Self-pay | Admitting: *Deleted

## 2022-04-07 ENCOUNTER — Inpatient Hospital Stay: Payer: No Typology Code available for payment source

## 2022-04-07 DIAGNOSIS — C50412 Malignant neoplasm of upper-outer quadrant of left female breast: Secondary | ICD-10-CM | POA: Insufficient documentation

## 2022-04-07 DIAGNOSIS — Z170 Estrogen receptor positive status [ER+]: Secondary | ICD-10-CM | POA: Insufficient documentation

## 2022-04-07 DIAGNOSIS — Z0189 Encounter for other specified special examinations: Secondary | ICD-10-CM

## 2022-04-07 DIAGNOSIS — Z01818 Encounter for other preprocedural examination: Secondary | ICD-10-CM | POA: Insufficient documentation

## 2022-04-07 DIAGNOSIS — I517 Cardiomegaly: Secondary | ICD-10-CM | POA: Insufficient documentation

## 2022-04-07 LAB — ECHOCARDIOGRAM COMPLETE
Area-P 1/2: 3.47 cm2
S' Lateral: 3.1 cm
Single Plane A4C EF: 62.9 %

## 2022-04-07 NOTE — Telephone Encounter (Signed)
Changed appointment per open slot, called patient about new appointment details. Patient aware.

## 2022-04-07 NOTE — Progress Notes (Signed)
  Echocardiogram 2D Echocardiogram has been performed.  Bobbye Charleston 04/07/2022, 11:18 AM

## 2022-04-07 NOTE — Telephone Encounter (Signed)
-----   Message from Anastasia Pall, RN sent at 04/06/2022  2:59 PM EDT ----- Regarding: 1st time Enhertu, being seen by Dr. Orlena Sheldon, Mrs. [employer] [employer] receiver her 1st enhertu infusion today. She tolerated it well. Please follow-up with her tomorrow.

## 2022-04-07 NOTE — Telephone Encounter (Signed)
Called patient for chemo follow up using [employer] [phone]. States she is eating and drinking without any problems. No questions or concerns

## 2022-04-08 ENCOUNTER — Inpatient Hospital Stay: Payer: No Typology Code available for payment source

## 2022-04-08 ENCOUNTER — Encounter: Payer: Self-pay | Admitting: Hematology and Oncology

## 2022-04-08 ENCOUNTER — Inpatient Hospital Stay (HOSPITAL_BASED_OUTPATIENT_CLINIC_OR_DEPARTMENT_OTHER): Payer: No Typology Code available for payment source | Admitting: Hematology and Oncology

## 2022-04-08 ENCOUNTER — Ambulatory Visit (HOSPITAL_COMMUNITY)
Admission: RE | Admit: 2022-04-08 | Discharge: 2022-04-08 | Disposition: A | Discharge status: Home / Self Care | Payer: No Typology Code available for payment source | Source: Ambulatory Visit | Attending: Hematology and Oncology | Admitting: Hematology and Oncology

## 2022-04-08 ENCOUNTER — Other Ambulatory Visit: Payer: Self-pay

## 2022-04-08 ENCOUNTER — Inpatient Hospital Stay: Payer: No Typology Code available for payment source | Admitting: Hematology and Oncology

## 2022-04-08 DIAGNOSIS — Z170 Estrogen receptor positive status [ER+]: Secondary | ICD-10-CM

## 2022-04-08 DIAGNOSIS — C50412 Malignant neoplasm of upper-outer quadrant of left female breast: Secondary | ICD-10-CM

## 2022-04-08 MED ORDER — GADOBUTROL 1 MMOL/ML IV SOLN
8.5000 mL | Freq: Once | INTRAVENOUS | Status: AC | PRN
  Administered 2022-04-08: 8.5 mL via INTRAVENOUS

## 2022-04-08 NOTE — Assessment & Plan Note (Addendum)
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  She was most recently seen for ongoing breast redness, given a course of antibiotics but had persistent redness hence we proceeded with a punch biopsy.  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  Pathology was sent to [facility] for second opinion as well. This confirmed triple negative breast cancer. She progressed on after 2 cycles of TC hence we have switched her treatment to Enhertu.  Repeat biopsy demonstrated her HER2 to be 1+. Soon after her infusion, she came back to the clinic with worsening redness of her left breast extending now superiorly.  This could indeed be possible underlying cellulitis or tumor flare.  We gave her a dose of vancomycin and also gave her some doxycycline. She is here for follow-up.  The redness appears mildly improved.  The breast also feels a bit softer in the upper portion.  She was encouraged to continue antibiotics as prescribed and return to clinic for follow-up in a week.  With regards to the hives that she has noticed in the abdomen that comes and goes, she does not have it today.  I however have seen pictures on her phone which definitely looks like some reaction.  We will continue to monitor this.  She is otherwise tolerating Enhertu very well.   Grade 1 nausea, okay to use dexamethasone as prescribed and as needed nausea medication Grade 1 fatigue, will continue to monitor

## 2022-04-08 NOTE — Progress Notes (Signed)
[facility] Follow up:    Monica Merino I, NP No address on file   DIAGNOSIS:  Cancer Staging  Malignant neoplasm of upper-outer quadrant of left breast in female, estrogen receptor positive ([place]) Staging form: Breast, AJCC 8th Edition - Clinical stage from 01/29/2020: Stage IIIA (cT2, cN1, cM0, G3, ER+, PR-, HER2-) - Unsigned Stage prefix: Initial diagnosis Histologic grading system: 3 grade system - Pathologic stage from 07/15/2020: No Stage Recommended (ypT1b, pN0, cM0, G3, ER-, PR-, HER2-) - Signed by Gardenia Phlegm, NP on 04/01/2022 Stage prefix: Post-therapy Histologic grading system: 3 grade system   SUMMARY OF ONCOLOGIC HISTORY: Oncology History  Malignant neoplasm of upper-outer quadrant of left breast in female, estrogen receptor positive ([place])  01/23/2020 Initial Diagnosis   [place] woman status post left breast upper outer quadrant biopsy 01/23/2020 for a clinical T3 N1, 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             (a) chest CT scan and bone scan 02/07/2020 showed no evidence of metastatic disease   02/07/2020 Genetic Testing   Negative genetic testing. No pathogenic variants identified on the [facility] Common Hereditary Cancers Panel. VUS in MSH6 called c.2744C>G identified. The report date is 02/07/2020.  The Common Hereditary Cancers Panel offered by [facility] includes sequencing and/or deletion duplication testing of the following 48 genes: APC, ATM, AXIN2, BARD1, BMPR1A, BRCA1, BRCA2, BRIP1, CDH1, CDKN2A (p14ARF), CDKN2A (p16INK4a), CKD4, CHEK2, CTNNA1, DICER1, EPCAM (Deletion/duplication testing only), GREM1 (promoter region deletion/duplication testing only), KIT, MEN1, MLH1, MSH2, MSH3, MSH6, MUTYH, NBN, NF1, NHTL1, PALB2, PDGFRA, PMS2, POLD1, POLE, PTEN, RAD50, RAD51C, RAD51D, RNF43, SDHB, SDHC, SDHD, SMAD4, SMARCA4. STK11, TP53, TSC1, TSC2, and VHL.  The following genes were  evaluated for sequence changes only: SDHA and HOXB13 c.251G>A variant only.   02/12/2020 - 05/28/2020 Neo-Adjuvant Chemotherapy   neoadjuvant chemotherapy consisting of doxorubicin and cyclophosphamide in dose dense fashion x4 started 02/12/2020, completed 03/25/2020, followed by paclitaxel and carboplatin weekly x12 started 04/08/2020 and completed on 05/28/2020             (a) echo 02/07/2020 shows an ejection fraction in the 55-60% range             (b) Epirubicin substituted for Doxorubicin starting with cycle 2 of AC secondary to allergic rash from Doxorubicin   07/15/2020 Surgery   status post left lumpectomy and sentinel lymph node sampling 07/15/2020 for a ypT1b ypN0 residual invasive ductal carcinoma, grade 3, with negative margins.             (a) a total of 4 left axillary lymph nodes were removed             (b) repeat prognostic panel on the final pathology again triple negative, with an MIB-1 of 50%.   07/15/2020 Cancer Staging   Staging form: Breast, AJCC 8th Edition - Pathologic stage from 07/15/2020: No Stage Recommended (ypT1b, pN0, cM0, G3, ER-, PR-, HER2-) - Signed by Gardenia Phlegm, NP on 04/01/2022 Stage prefix: Post-therapy Histologic grading system: 3 grade system   08/26/2020 - 10/09/2020 Radiation Therapy   adjuvant radiation completed 10/09/2020             (a) sensitizing capecitabine attempted but not tolerated by the patient  08/26/2020 through 10/09/2020 Site Technique Total Dose (Gy) Dose per Fx (Gy) Completed Fx Beam Energies  Breast, Left: Breast_Lt 3D 50/50 2 25/25 15X  Breast, Left: Breast_Lt_PAB_SCV 3D 50/50  2 25/25 10X, 15X  Breast, Left: Breast_Lt_Bst 3D 10/10 2 5/5 6X, 10X    11/23/2020 - 01/04/2021 Adjuvant Chemotherapy   adjuvant pembrolizumab started 11/23/2020, to continue for 1 year             (a) discontinued after 01/04/2021 dose with multiple side effects requiring steroids for resolution   03/30/2022 Mammogram   Mammogram and ultrasound  show numerous masses throughout left breast not visualized in 01/2022, +left axillary lymphadenopathy, skin thickening in right breast and in upper abdomen just below sternum   04/06/2022 -  Chemotherapy   Patient is on Treatment Plan : BREAST METASTATIC fam-trastuzumab deruxtecan-nxki (Enhertu) q21d     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  Prognostic panel sent to [facility]: Left breast skin, punch biopsy: Skin with dermal lymphatic involvement by carcinoma (confirmed with outside CK7 IHC, reviewed). Biomarkers are reported as negative for ER, PR, Her2/neu (confirmed on review but please note sample size is extremely small).   02/15/2022 - 03/08/2022 Chemotherapy    Taxotere/Cytoxan x 2, discontinued due to progression of breast.   03/30/2022 Mammogram   Mammogram and ultrasound show numerous masses throughout left breast not visualized in 01/2022, +left axillary lymphadenopathy, skin thickening in right breast and in upper abdomen just below sternum   04/06/2022 -  Chemotherapy   Patient is on Treatment Plan : BREAST METASTATIC fam-trastuzumab deruxtecan-nxki (Enhertu) q21d      CURRENT THERAPY: progression on Taxotere and cytoxan  INTERVAL HISTORY:  [name] 46 y.o. female returns for follow-up of her breast cancer.   Since day before yesterday, redness and swelling in the breast has decreased a bit.  She is tolerating antibiotics well.  She also describes a hive like rash that appears on the abdomen and resolved spontaneously.  She had pictures of this which she showed to me.  She otherwise feels a bit fatigued, mild nausea but no vomiting.  No diarrhea, no change in breathing.  She overall feels encouraged about this treatment. 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[place])  04/01/2022   Encounter for dental examination 03/11/2022   Acute apical periodontitis 03/11/2022   Phobia of dental procedure 03/11/2022   Defective dental restoration 03/11/2022   Accretions on teeth 03/11/2022   Chronic periodontitis 03/11/2022   Teeth missing 03/11/2022   Caries 03/11/2022   Generalized gingival recession 03/11/2022   Pain, dental 03/08/2022   Cancer-related pain 03/08/2022   Port-A-Cath in place 02/15/2022   Oral allergy syndrome, subsequent encounter 02/03/2022   Adverse effect of other drugs, medicaments and biological substances, subsequent encounter 02/02/2022   Other adverse food reactions, not elsewhere classified, subsequent encounter 02/02/2022   Chest tightness 02/02/2022   Other allergic rhinitis 02/02/2022   Allergic conjunctivitis of both eyes 02/02/2022   Language barrier 12/11/2020   Morbid obesity ([place])    History of breast cancer    Genetic testing 02/13/2020   Malignant neoplasm of upper-outer quadrant of left breast in female, estrogen receptor positive ([place]) 01/27/2020   Impaired ability to use community resources due to language barrier 07/13/2013    is allergic to omnipaque [iohexol], doxorubicin hcl, keytruda [pembrolizumab], other, and tape.  MEDICAL HISTORY: Past Medical History:  Diagnosis Date   Allergy    Asthma    Breast cancer  in female [facility])    Left   Breast disorder    breast cancer May 2021   Chronic back pain    History of breast cancer    History of COVID-19 04/20/2021   Hx of migraines    Morbid obesity ([facility])    Personal history of chemotherapy    Personal history of radiation therapy     SURGICAL HISTORY: Past Surgical History:  Procedure Laterality Date   BREAST LUMPECTOMY     BREAST LUMPECTOMY WITH RADIOACTIVE SEED AND SENTINEL LYMPH NODE BIOPSY Left 07/15/2020   Procedure: LEFT BREAST LUMPECTOMY WITH RADIOACTIVE SEED AND LEFT AXILLARY SENTINEL LYMPH NODE BIOPSY, LEFT AXILLARY NODE RADIOACTIVE SEED GUIDED  EXCISION, LEFT BREAST RADIOACTIVE SEED GUIDED EXCISION IM NODE;  Surgeon: Emelia Loron, MD;  Location: [facility] OR;  Service: General;  Laterality: Left;  PEC BLOCK   CESAREAN SECTION WITH BILATERAL TUBAL LIGATION Bilateral 07/23/2015   Procedure: CESAREAN SECTION WITH BILATERAL TUBAL LIGATION;  Surgeon: Reva Bores, MD;  Location: [facility];  Service: Obstetrics;  Laterality: Bilateral;   IR IMAGING GUIDED PORT INSERTION  02/11/2022   PORT-A-CATH REMOVAL N/A 04/30/2021   Procedure: REMOVAL PORT-A-CATH;  Surgeon: Emelia Loron, MD;  Location: [facility];  Service: General;  Laterality: N/A;   PORTACATH PLACEMENT Right 02/11/2020   Procedure: INSERTION PORT-A-CATH WITH ULTRASOUND GUIDANCE;  Surgeon: Emelia Loron, MD;  Location: [facility];  Service: General;  Laterality: Right;   TUBAL LIGATION      SOCIAL HISTORY: Social History   Socioeconomic History   Marital status: Legally Separated    Spouse name: Not on file   Number of children: 3   Years of education: Not on file   Highest education level: 9th grade  Occupational History   Occupation: Holiday representative  Tobacco Use   Smoking status: Never   Smokeless tobacco: Never  Vaping Use   Vaping Use: Never used  Substance and Sexual Activity   Alcohol use: No   Drug use: Never   Sexual activity: Yes    Birth control/protection: Surgical  Other Topics Concern   Not on file  Social History Narrative   Not on file   Social Determinants of Health   Financial Resource Strain: Not on file  Food Insecurity: No Food Insecurity (03/29/2022)   Hunger Vital Sign    Worried About Running Out of Food in the Last Year: Never true    Ran Out of Food in the Last Year: Never true  Transportation Needs: No Transportation Needs (03/29/2022)   PRAPARE - Administrator, Civil Service (Medical): No    Lack of Transportation (Non-Medical): No  Physical Activity: Not on file  Stress: Not on file   Social Connections: Not on file  Intimate Partner Violence: Not on file    FAMILY HISTORY: Family History  Problem Relation Age of Onset   Heart disease Maternal Grandmother     Review of Systems  Constitutional:  Positive for fatigue. Negative for appetite change, chills, fever and unexpected weight change.  HENT:   Negative for hearing loss, lump/mass and trouble swallowing.   Eyes:  Negative for eye problems and icterus.  Respiratory:  Negative for chest tightness, cough and shortness of breath.   Cardiovascular:  Negative for chest pain, leg swelling and palpitations.  Gastrointestinal:  Positive for nausea. Negative for abdominal distention, abdominal pain, constipation, diarrhea and vomiting.  Endocrine: Negative for hot flashes.  Genitourinary:  Negative for difficulty urinating.  Musculoskeletal:  Negative for arthralgias.  Skin:  Positive for rash. Negative for itching.  Neurological:  Negative for dizziness, extremity weakness, headaches and numbness.  Hematological:  Negative for adenopathy. Does not bruise/bleed easily.  Psychiatric/Behavioral:  Negative for depression. The patient is not nervous/anxious.       PHYSICAL EXAMINATION  ECOG PERFORMANCE STATUS: 1 - Symptomatic but completely ambulatory  Vitals:   04/08/22 0832  BP: 123/72  Pulse: 74  Resp: 16  Temp: (!) 97 F (36.1 C)  SpO2: 99%     Physical Exam Constitutional:      General: She is not in acute distress.    Appearance: Normal appearance. She is not toxic-appearing.  HENT:     Head: Normocephalic and atraumatic.  Eyes:     General: No scleral icterus. Cardiovascular:     Rate and Rhythm: Normal rate and regular rhythm.     Pulses: Normal pulses.     Heart sounds: Normal heart sounds.  Pulmonary:     Effort: Pulmonary effort is normal.     Breath sounds: Normal breath sounds.  Chest:     Comments: Please refer to pictures in media from today.  Mild improvement in redness in the left  breast.  Some increased redness in the left breast posterior aspect but this tends to fluctuate.  Breast overall feels softer in the superior aspect. Abdominal:     General: Abdomen is flat. Bowel sounds are normal. There is no distension.     Palpations: Abdomen is soft.     Tenderness: There is no abdominal tenderness.  Musculoskeletal:        General: No swelling.     Cervical back: Neck supple.  Lymphadenopathy:     Cervical: No cervical adenopathy.  Skin:    General: Skin is warm and dry.     Findings: No rash.  Neurological:     General: No focal deficit present.     Mental Status: She is alert.  Psychiatric:        Mood and Affect: Mood normal.        Behavior: Behavior normal.     Media Information  7/28    Media Information   Document Information  Photos  04/08/2022  04/08/2022 08:40  Attached To:  Office Visit on 04/08/22 with Benay Pike, MD   Source Information  Benay Pike, MD  [facility]     Document Information  Photos  04/06/2022  04/06/2022 11:27  Attached To:  Office Visit on 04/06/22 with Benay Pike, MD   Source Information  Benay Pike, MD  [facility]    LABORATORY DATA:  None today  RADIOGRAPHIC STUDIES:  ECHOCARDIOGRAM COMPLETE  Result Date: 04/07/2022    ECHOCARDIOGRAM REPORT   Patient Name:   [facility] Date of Exam: 04/07/2022 Medical Rec #:  [id number]             Height:       60.0 in Accession #:    [id number]            Weight:       186.3 lb Date of Birth:  1975/10/07             BSA:          1.811 m Patient Age:    59 years              BP:           110/69 mmHg Patient Gender: F  HR:           71 bpm. Exam Location:  Outpatient Procedure: 2D Echo, Cardiac Doppler, Color Doppler and Strain Analysis Indications:    Z51.11 Encounter for antineoplastic chemotheraphy  History:        Patient has prior history of Echocardiogram examinations, most                 recent  10/21/2020. Metastatic breast cancer.  Sonographer:    Roseanna Rainbow RDCS Referring Phys: [phone] [facility] CORNETTO CAUSEY  Sonographer Comments: Technically difficult study due to poor echo windows, suboptimal parasternal window and suboptimal apical window. Image acquisition challenging due to patient body habitus. Global longitudinal strain was attempted. Extremely difficult study due to swelling and tissue consistency. 3D attempted, but older system version. IMPRESSIONS  1. Left ventricular ejection fraction, by estimation, is 55 to 60%. The left ventricle has normal function. The left ventricle has no regional wall motion abnormalities. There is mild left ventricular hypertrophy. Left ventricular diastolic parameters were normal. The average left ventricular global longitudinal strain is -17.4 %. The global longitudinal strain is normal.  2. Right ventricular systolic function is normal. The right ventricular size is normal.  3. The mitral valve is normal in structure. Trivial mitral valve regurgitation. No evidence of mitral stenosis.  4. The aortic valve is tricuspid. Aortic valve regurgitation is not visualized. No aortic stenosis is present.  5. The inferior vena cava is normal in size with greater than 50% respiratory variability, suggesting right atrial pressure of 3 mmHg. FINDINGS  Left Ventricle: Left ventricular ejection fraction, by estimation, is 55 to 60%. The left ventricle has normal function. The left ventricle has no regional wall motion abnormalities. The average left ventricular global longitudinal strain is -17.4 %. The global longitudinal strain is normal. The left ventricular internal cavity size was normal in size. There is mild left ventricular hypertrophy. Left ventricular diastolic parameters were normal. Right Ventricle: The right ventricular size is normal. No increase in right ventricular wall thickness. Right ventricular systolic function is normal. Left Atrium: Left atrial size was normal in  size. Right Atrium: Right atrial size was normal in size. Pericardium: There is no evidence of pericardial effusion. Mitral Valve: The mitral valve is normal in structure. Trivial mitral valve regurgitation. No evidence of mitral valve stenosis. Tricuspid Valve: The tricuspid valve is normal in structure. Tricuspid valve regurgitation is not demonstrated. No evidence of tricuspid stenosis. Aortic Valve: The aortic valve is tricuspid. Aortic valve regurgitation is not visualized. No aortic stenosis is present. Pulmonic Valve: The pulmonic valve was normal in structure. Pulmonic valve regurgitation is not visualized. No evidence of pulmonic stenosis. Aorta: The aortic root is normal in size and structure. Venous: The inferior vena cava is normal in size with greater than 50% respiratory variability, suggesting right atrial pressure of 3 mmHg. IAS/Shunts: No atrial level shunt detected by color flow Doppler.  LEFT VENTRICLE PLAX 2D LVIDd:         4.45 cm     Diastology LVIDs:         3.10 cm     LV e' medial:    9.68 cm/s LV PW:         1.20 cm     LV E/e' medial:  9.2 LV IVS:        1.15 cm     LV e' lateral:   12.90 cm/s LVOT diam:     1.90 cm     LV E/e' lateral:  6.9 LV SV:         56 LV SV Index:   31          2D Longitudinal Strain LVOT Area:     2.84 cm    2D Strain GLS Avg:     -17.4 %  LV Volumes (MOD) LV vol d, MOD A4C: 49.3 ml LV vol s, MOD A4C: 18.3 ml LV SV MOD A4C:     49.3 ml RIGHT VENTRICLE             IVC RV S prime:     10.70 cm/s  IVC diam: 1.70 cm TAPSE (M-mode): 2.3 cm LEFT ATRIUM           Index        RIGHT ATRIUM           Index LA diam:      2.80 cm 1.55 cm/m   RA Area:     11.20 cm LA Vol (A2C): 26.1 ml 14.41 ml/m  RA Volume:   22.80 ml  12.59 ml/m LA Vol (A4C): 23.7 ml 13.09 ml/m  AORTIC VALVE LVOT Vmax:   95.30 cm/s LVOT Vmean:  62.700 cm/s LVOT VTI:    0.196 m  AORTA Ao Root diam: 3.00 cm Ao Asc diam:  3.00 cm MITRAL VALVE MV Area (PHT): 3.47 cm    SHUNTS MV Decel Time: 219 msec     Systemic VTI:  0.20 m MV E velocity: 88.70 cm/s  Systemic Diam: 1.90 cm MV A velocity: 50.35 cm/s MV E/A ratio:  1.76 MV A Prime:    13.4 cm/s Jenkins Rouge MD Electronically signed by Jenkins Rouge MD Signature Date/Time: 04/07/2022/12:02:34 PM    Final       ASSESSMENT and THERAPY PLAN:   Malignant neoplasm of upper-outer quadrant of left breast in female, estrogen receptor positive ([place]) [name] is a 46 year old Spanish-speaking woman who has a 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  She was most recently seen for ongoing breast redness, given a course of antibiotics but had persistent redness hence we proceeded with a punch biopsy.  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  Pathology was sent to [facility] for second opinion as well. This confirmed triple negative breast cancer. She progressed on after 2 cycles of TC hence we have switched her treatment to Enhertu.  Repeat biopsy demonstrated her HER2 to be 1+. Soon after her infusion, she came back to the clinic with worsening redness of her left breast extending now superiorly.  This could indeed be possible underlying cellulitis or tumor flare.  We gave her a dose of vancomycin and also gave her some doxycycline. She is here for follow-up.  The redness appears mildly improved.  The breast also feels a bit softer in the upper portion.  She was encouraged to continue antibiotics as prescribed and return to clinic for follow-up in a week.  With regards to the hives that she has noticed in the abdomen that comes and goes, she does not have it today.  Thomas however have seen pictures on her phone which definitely looks like some reaction.  We will continue to monitor this.  She is otherwise tolerating Enhertu very well.   Grade 1 nausea, okay to use dexamethasone as prescribed and as needed nausea medication Grade 1 fatigue, will continue to  monitor   All questions were answered. The patient knows to  call the clinic with any problems, questions or concerns. We can certainly see the patient much sooner if necessary.  Total time spent: 30 minutes   *Total Encounter Time as defined by the Centers for Medicare and Medicaid Services includes, in addition to the face-to-face time of a patient visit (documented in the note above) non-face-to-face time: obtaining and reviewing outside history, ordering and reviewing medications, tests or procedures, care coordination (communications with other health care professionals or caregivers) and documentation in the medical record.

## 2022-04-11 ENCOUNTER — Encounter: Payer: Self-pay | Admitting: Adult Health

## 2022-04-11 ENCOUNTER — Encounter: Payer: Self-pay | Admitting: Hematology and Oncology

## 2022-04-12 ENCOUNTER — Telehealth: Payer: Self-pay

## 2022-04-12 ENCOUNTER — Ambulatory Visit
Admission: RE | Admit: 2022-04-12 | Discharge: 2022-04-12 | Disposition: A | Discharge status: Home / Self Care | Payer: No Typology Code available for payment source | Source: Ambulatory Visit | Attending: Hematology and Oncology | Admitting: Hematology and Oncology

## 2022-04-12 DIAGNOSIS — R928 Other abnormal and inconclusive findings on diagnostic imaging of breast: Secondary | ICD-10-CM

## 2022-04-12 DIAGNOSIS — C50412 Malignant neoplasm of upper-outer quadrant of left female breast: Secondary | ICD-10-CM

## 2022-04-12 NOTE — Telephone Encounter (Signed)
S/W pt regarding contrast dye allergy. She states she has the 13 hour prep and verbalized directions to me.

## 2022-04-13 ENCOUNTER — Encounter (HOSPITAL_COMMUNITY)
Admission: RE | Admit: 2022-04-13 | Discharge: 2022-04-13 | Disposition: A | Discharge status: Home / Self Care | Payer: No Typology Code available for payment source | Source: Ambulatory Visit | Attending: Hematology and Oncology | Admitting: Hematology and Oncology

## 2022-04-13 ENCOUNTER — Other Ambulatory Visit: Payer: Self-pay | Admitting: Oncology

## 2022-04-13 DIAGNOSIS — Z170 Estrogen receptor positive status [ER+]: Secondary | ICD-10-CM | POA: Insufficient documentation

## 2022-04-13 DIAGNOSIS — R590 Localized enlarged lymph nodes: Secondary | ICD-10-CM | POA: Insufficient documentation

## 2022-04-13 DIAGNOSIS — N6320 Unspecified lump in the left breast, unspecified quadrant: Secondary | ICD-10-CM | POA: Insufficient documentation

## 2022-04-13 DIAGNOSIS — N6325 Unspecified lump in the left breast, overlapping quadrants: Secondary | ICD-10-CM

## 2022-04-13 DIAGNOSIS — R928 Other abnormal and inconclusive findings on diagnostic imaging of breast: Secondary | ICD-10-CM

## 2022-04-13 DIAGNOSIS — C50412 Malignant neoplasm of upper-outer quadrant of left female breast: Secondary | ICD-10-CM | POA: Insufficient documentation

## 2022-04-13 MED ORDER — SODIUM CHLORIDE (PF) 0.9 % IJ SOLN
INTRAMUSCULAR | Status: AC
  Filled 2022-04-13: qty 50

## 2022-04-13 MED ORDER — TECHNETIUM TC 99M MEDRONATE IV KIT
21.5000 | PACK | Freq: Once | INTRAVENOUS | Status: AC
  Administered 2022-04-13: 21.5 via INTRAVENOUS

## 2022-04-13 MED ORDER — IOHEXOL 300 MG/ML  SOLN
100.0000 mL | Freq: Once | INTRAMUSCULAR | Status: AC | PRN
  Administered 2022-04-13: 100 mL via INTRAVENOUS

## 2022-04-13 MED ORDER — IOHEXOL 9 MG/ML PO SOLN
1000.0000 mL | Freq: Once | ORAL | Status: AC
  Administered 2022-04-13: 1000 mL via ORAL

## 2022-04-14 ENCOUNTER — Other Ambulatory Visit: Payer: Self-pay

## 2022-04-14 ENCOUNTER — Inpatient Hospital Stay
Payer: No Typology Code available for payment source | Attending: Hematology and Oncology | Admitting: Hematology and Oncology

## 2022-04-14 ENCOUNTER — Encounter: Payer: Self-pay | Admitting: Hematology and Oncology

## 2022-04-14 DIAGNOSIS — Z923 Personal history of irradiation: Secondary | ICD-10-CM | POA: Insufficient documentation

## 2022-04-14 DIAGNOSIS — E669 Obesity, unspecified: Secondary | ICD-10-CM | POA: Insufficient documentation

## 2022-04-14 DIAGNOSIS — Z5111 Encounter for antineoplastic chemotherapy: Secondary | ICD-10-CM | POA: Insufficient documentation

## 2022-04-14 DIAGNOSIS — G893 Neoplasm related pain (acute) (chronic): Secondary | ICD-10-CM | POA: Insufficient documentation

## 2022-04-14 DIAGNOSIS — C50412 Malignant neoplasm of upper-outer quadrant of left female breast: Secondary | ICD-10-CM

## 2022-04-14 DIAGNOSIS — Z171 Estrogen receptor negative status [ER-]: Secondary | ICD-10-CM | POA: Insufficient documentation

## 2022-04-14 DIAGNOSIS — Z170 Estrogen receptor positive status [ER+]: Secondary | ICD-10-CM

## 2022-04-14 NOTE — Assessment & Plan Note (Addendum)
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  She was most recently seen for ongoing breast redness, given a course of antibiotics but had persistent redness hence we proceeded with a punch biopsy.  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  Pathology was sent to [facility] for second opinion as well. This confirmed triple negative breast cancer.  She progressed on after 2 cycles of TC hence we have switched her treatment to Enhertu.  Repeat biopsy demonstrated her HER2 to be 1+. After her last infusion, she came back to the clinic with increasing redness history of treated empirically for cellulitis, gave her a dose of IV vancomycin, oral doxycycline and she is here for follow-up.  Since last visit she had some staging scans again this did not show any evidence of metastatic disease.  Right axillary lymph node biopsy showed benign changes.  Physical examination today shows some evidence of breast implants, the breast feels softer especially in the upper inner quadrant, redness appears more.  Overall at this time there is no concern for progression.  We have discussed about at least trying 2-3 cycles of Enhertu and reconvene about surgical planning.  She is agreeable to this.  Return to clinic as scheduled

## 2022-04-14 NOTE — Progress Notes (Signed)
[facility] Follow up:    Monica Merino I, NP No address on file   DIAGNOSIS:  Cancer Staging  Malignant neoplasm of upper-outer quadrant of left breast in female, estrogen receptor positive ([place]) Staging form: Breast, AJCC 8th Edition - Clinical stage from 01/29/2020: Stage IIIA (cT2, cN1, cM0, G3, ER+, PR-, HER2-) - Unsigned Stage prefix: Initial diagnosis Histologic grading system: 3 grade system - Pathologic stage from 07/15/2020: No Stage Recommended (ypT1b, pN0, cM0, G3, ER-, PR-, HER2-) - Signed by Gardenia Phlegm, NP on 04/01/2022 Stage prefix: Post-therapy Histologic grading system: 3 grade system   SUMMARY OF ONCOLOGIC HISTORY: Oncology History  Malignant neoplasm of upper-outer quadrant of left breast in female, estrogen receptor positive ([place])  01/23/2020 Initial Diagnosis   [place] woman status post left breast upper outer quadrant biopsy 01/23/2020 for a clinical T3 N1, 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             (a) chest CT scan and bone scan 02/07/2020 showed no evidence of metastatic disease   02/07/2020 Genetic Testing   Negative genetic testing. No pathogenic variants identified on the [facility] Common Hereditary Cancers Panel. VUS in MSH6 called c.2744C>G identified. The report date is 02/07/2020.  The Common Hereditary Cancers Panel offered by [facility] includes sequencing and/or deletion duplication testing of the following 48 genes: APC, ATM, AXIN2, BARD1, BMPR1A, BRCA1, BRCA2, BRIP1, CDH1, CDKN2A (p14ARF), CDKN2A (p16INK4a), CKD4, CHEK2, CTNNA1, DICER1, EPCAM (Deletion/duplication testing only), GREM1 (promoter region deletion/duplication testing only), KIT, MEN1, MLH1, MSH2, MSH3, MSH6, MUTYH, NBN, NF1, NHTL1, PALB2, PDGFRA, PMS2, POLD1, POLE, PTEN, RAD50, RAD51C, RAD51D, RNF43, SDHB, SDHC, SDHD, SMAD4, SMARCA4. STK11, TP53, TSC1, TSC2, and VHL.  The following genes were  evaluated for sequence changes only: SDHA and HOXB13 c.251G>A variant only.   02/12/2020 - 05/28/2020 Neo-Adjuvant Chemotherapy   neoadjuvant chemotherapy consisting of doxorubicin and cyclophosphamide in dose dense fashion x4 started 02/12/2020, completed 03/25/2020, followed by paclitaxel and carboplatin weekly x12 started 04/08/2020 and completed on 05/28/2020             (a) echo 02/07/2020 shows an ejection fraction in the 55-60% range             (b) Epirubicin substituted for Doxorubicin starting with cycle 2 of AC secondary to allergic rash from Doxorubicin   07/15/2020 Surgery   status post left lumpectomy and sentinel lymph node sampling 07/15/2020 for a ypT1b ypN0 residual invasive ductal carcinoma, grade 3, with negative margins.             (a) a total of 4 left axillary lymph nodes were removed             (b) repeat prognostic panel on the final pathology again triple negative, with an MIB-1 of 50%.   07/15/2020 Cancer Staging   Staging form: Breast, AJCC 8th Edition - Pathologic stage from 07/15/2020: No Stage Recommended (ypT1b, pN0, cM0, G3, ER-, PR-, HER2-) - Signed by Gardenia Phlegm, NP on 04/01/2022 Stage prefix: Post-therapy Histologic grading system: 3 grade system   08/26/2020 - 10/09/2020 Radiation Therapy   adjuvant radiation completed 10/09/2020             (a) sensitizing capecitabine attempted but not tolerated by the patient  08/26/2020 through 10/09/2020 Site Technique Total Dose (Gy) Dose per Fx (Gy) Completed Fx Beam Energies  Breast, Left: Breast_Lt 3D 50/50 2 25/25 15X  Breast, Left: Breast_Lt_PAB_SCV 3D 50/50  2 25/25 10X, 15X  Breast, Left: Breast_Lt_Bst 3D 10/10 2 5/5 6X, 10X    11/23/2020 - 01/04/2021 Adjuvant Chemotherapy   adjuvant pembrolizumab started 11/23/2020, to continue for 1 year             (a) discontinued after 01/04/2021 dose with multiple side effects requiring steroids for resolution   03/30/2022 Mammogram   Mammogram and ultrasound  show numerous masses throughout left breast not visualized in 01/2022, +left axillary lymphadenopathy, skin thickening in right breast and in upper abdomen just below sternum   04/06/2022 -  Chemotherapy   Patient is on Treatment Plan : BREAST METASTATIC fam-trastuzumab deruxtecan-nxki (Enhertu) q21d     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  Prognostic panel sent to [facility]: Left breast skin, punch biopsy: Skin with dermal lymphatic involvement by carcinoma (confirmed with outside CK7 IHC, reviewed). Biomarkers are reported as negative for ER, PR, Her2/neu (confirmed on review but please note sample size is extremely small).   02/15/2022 - 03/08/2022 Chemotherapy    Taxotere/Cytoxan x 2, discontinued due to progression of breast.   03/30/2022 Mammogram   Mammogram and ultrasound show numerous masses throughout left breast not visualized in 01/2022, +left axillary lymphadenopathy, skin thickening in right breast and in upper abdomen just below sternum   04/06/2022 -  Chemotherapy   Patient is on Treatment Plan : BREAST METASTATIC fam-trastuzumab deruxtecan-nxki (Enhertu) q21d      CURRENT THERAPY: progression on Taxotere and cytoxan  INTERVAL HISTORY:  [name] 46 y.o. female returns for follow-up of her breast cancer.   She notices that the left breast is a bit softer compared to last visit. No worsening redness. Pain in the left breast is better compared to last visit. 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[place]) 04/01/2022   Encounter for dental examination 03/11/2022   Acute apical periodontitis 03/11/2022   Phobia of dental procedure 03/11/2022   Defective  dental restoration 03/11/2022   Accretions on teeth 03/11/2022   Chronic periodontitis 03/11/2022   Teeth missing 03/11/2022   Caries 03/11/2022   Generalized gingival recession 03/11/2022   Pain, dental 03/08/2022   Cancer-related pain 03/08/2022   Port-A-Cath in place 02/15/2022   Oral allergy syndrome, subsequent encounter 02/03/2022   Adverse effect of other drugs, medicaments and biological substances, subsequent encounter 02/02/2022   Other adverse food reactions, not elsewhere classified, subsequent encounter 02/02/2022   Chest tightness 02/02/2022   Other allergic rhinitis 02/02/2022   Allergic conjunctivitis of both eyes 02/02/2022   Language barrier 12/11/2020   Morbid obesity ([place])    History of breast cancer    Genetic testing 02/13/2020   Malignant neoplasm of upper-outer quadrant of left breast in female, estrogen receptor positive ([place]) 01/27/2020   Impaired ability to use community resources due to language barrier 07/13/2013    is allergic to omnipaque [iohexol], doxorubicin hcl, keytruda [pembrolizumab], other, and tape.  MEDICAL HISTORY: Past Medical History:  Diagnosis Date   Allergy    Asthma    Breast cancer in female [facility])    Left   Breast disorder    breast cancer May 2021   Chronic back pain  History of breast cancer    History of COVID-19 04/20/2021   Hx of migraines    Morbid obesity ([place])    Personal history of chemotherapy    Personal history of radiation therapy     SURGICAL HISTORY: Past Surgical History:  Procedure Laterality Date   BREAST LUMPECTOMY     BREAST LUMPECTOMY WITH RADIOACTIVE SEED AND SENTINEL LYMPH NODE BIOPSY Left 07/15/2020   Procedure: LEFT BREAST LUMPECTOMY WITH RADIOACTIVE SEED AND LEFT AXILLARY SENTINEL LYMPH NODE BIOPSY, LEFT AXILLARY NODE RADIOACTIVE SEED GUIDED EXCISION, LEFT BREAST RADIOACTIVE SEED GUIDED EXCISION IM NODE;  Surgeon: Rolm Bookbinder, MD;  Location: [facility];  Service: General;  Laterality: Left;   PEC BLOCK   CESAREAN SECTION WITH BILATERAL TUBAL LIGATION Bilateral 07/23/2015   Procedure: CESAREAN SECTION WITH BILATERAL TUBAL LIGATION;  Surgeon: Donnamae Jude, MD;  Location: [facility];  Service: Obstetrics;  Laterality: Bilateral;   IR IMAGING GUIDED PORT INSERTION  02/11/2022   PORT-A-CATH REMOVAL N/A 04/30/2021   Procedure: REMOVAL PORT-A-CATH;  Surgeon: Rolm Bookbinder, MD;  Location: [facility];  Service: General;  Laterality: N/A;   PORTACATH PLACEMENT Right 02/11/2020   Procedure: INSERTION PORT-A-CATH WITH ULTRASOUND GUIDANCE;  Surgeon: Rolm Bookbinder, MD;  Location: [name];  Service: General;  Laterality: Right;   TUBAL LIGATION      SOCIAL HISTORY: Social History   Socioeconomic History   Marital status: Legally Separated    Spouse name: Not on file   Number of children: 3   Years of education: Not on file   Highest education level: 9th grade  Occupational History   Occupation: Architect  Tobacco Use   Smoking status: Never   Smokeless tobacco: Never  Vaping Use   Vaping Use: Never used  Substance and Sexual Activity   Alcohol use: No   Drug use: Never   Sexual activity: Yes    Birth control/protection: Surgical  Other Topics Concern   Not on file  Social History Narrative   Not on file   Social Determinants of Health   Financial Resource Strain: Not on file  Food Insecurity: No Food Insecurity (03/29/2022)   Hunger Vital Sign    Worried About Running Out of Food in the Last Year: Never true    Ran Out of Food in the Last Year: Never true  Transportation Needs: No Transportation Needs (03/29/2022)   PRAPARE - Hydrologist (Medical): No    Lack of Transportation (Non-Medical): No  Physical Activity: Not on file  Stress: Not on file  Social Connections: Not on file  Intimate Partner Violence: Not on file    FAMILY HISTORY: Family History  Problem Relation Age of Onset   Heart  disease Maternal Grandmother     Review of Systems  Constitutional:  Positive for fatigue. Negative for appetite change, chills, fever and unexpected weight change.  HENT:   Negative for hearing loss, lump/mass and trouble swallowing.   Eyes:  Negative for eye problems and icterus.  Respiratory:  Negative for chest tightness, cough and shortness of breath.   Cardiovascular:  Negative for chest pain, leg swelling and palpitations.  Gastrointestinal:  Positive for nausea. Negative for abdominal distention, abdominal pain, constipation, diarrhea and vomiting.  Endocrine: Negative for hot flashes.  Genitourinary:  Negative for difficulty urinating.   Musculoskeletal:  Negative for arthralgias.  Skin:  Positive for rash. Negative for itching.  Neurological:  Negative for dizziness, extremity weakness, headaches and numbness.  Hematological:  Negative for adenopathy. Does not bruise/bleed easily.  Psychiatric/Behavioral:  Negative for depression. The patient is not nervous/anxious.       PHYSICAL EXAMINATION  ECOG PERFORMANCE STATUS: 1 - Symptomatic but completely ambulatory  Vitals:   04/14/22 0907  BP: (!) 101/57  Pulse: 89  Resp: 18  Temp: (!) 97.4 F (36.3 C)  SpO2: 99%     Physical Exam Constitutional:      General: She is not in acute distress.    Appearance: Normal appearance. She is not toxic-appearing.  HENT:     Head: Normocephalic and atraumatic.  Eyes:     General: No scleral icterus. Cardiovascular:     Rate and Rhythm: Normal rate and regular rhythm.     Pulses: Normal pulses.     Heart sounds: Normal heart sounds.  Pulmonary:     Effort: Pulmonary effort is normal.     Breath sounds: Normal breath sounds.  Chest:     Comments: Left breast softer in upper inner quadrant area. The redness appears more dull pink, less angry. The area in midline also appears less prominent. Right inner breast redness also appears less prominent Posterior wall also appears  to be less prominent  Abdominal:     General: Abdomen is flat. Bowel sounds are normal. There is no distension.     Palpations: Abdomen is soft.     Tenderness: There is no abdominal tenderness.  Musculoskeletal:        General: No swelling.     Cervical back: Neck supple.  Lymphadenopathy:     Cervical: No cervical adenopathy.  Skin:    General: Skin is warm and dry.     Findings: No rash.  Neurological:     General: No focal deficit present.     Mental Status: She is alert.  Psychiatric:        Mood and Affect: Mood normal.        Behavior: Behavior normal.     Media Information  7/28  LABORATORY DATA:  None today  RADIOGRAPHIC STUDIES:  NM Bone Scan Whole Body  Result Date: 04/14/2022 CLINICAL DATA:  Invasive breast cancer. Evaluate for metastatic disease. EXAM: NUCLEAR MEDICINE WHOLE BODY BONE SCAN TECHNIQUE: Whole body anterior and posterior images were obtained approximately 3 hours after intravenous injection of radiopharmaceutical. RADIOPHARMACEUTICALS:  21.5 mCi Technetium-24mMDP IV COMPARISON:  CT scans, same date. FINDINGS: No areas of abnormal increased uptake in the axial or appendicular skeleton to suggest osseous metastatic disease. Moderate diffuse uptake in the left breast consistent with patient's known inflammatory breast cancer. IMPRESSION: No findings to suggest osseous metastatic disease. Electronically Signed   By: PMarijo SanesM.D.   On: 04/14/2022 09:06   CT Abdomen Pelvis W Contrast  Result Date: 04/13/2022 CLINICAL DATA:  Breast cancer. EXAM: CT CHEST, ABDOMEN, AND PELVIS WITH CONTRAST TECHNIQUE: Multidetector CT imaging of the chest, abdomen and pelvis was performed following the standard protocol during bolus administration of intravenous contrast. RADIATION DOSE REDUCTION: This exam was performed according to the departmental dose-optimization program which includes automated exposure control, adjustment of the mA and/or kV according to patient size  and/or use of iterative reconstruction technique. CONTRAST:  1044mOMNIPAQUE IOHEXOL 300 MG/ML  SOLN COMPARISON:  CT chest abdomen and pelvis 01/28/2022 FINDINGS: CT CHEST FINDINGS Cardiovascular: Right chest port catheter tip ends in the right atrium. Heart and aorta are normal in size. There is no pericardial effusion. Mediastinum/Nodes: Visualized thyroid gland and esophagus are within normal  limits. There are no enlarged mediastinal or hilar lymph nodes. Lungs/Pleura: Lungs are clear. No pleural effusion or pneumothorax. Musculoskeletal: No acute fracture or focal osseous lesion. Skin thickening of the left breast has increased. Masslike density in the left breast has increased in size now measuring 6.0 x 2.9 cm image 504/26 (previously 3.6 x 2.5 cm. Surgical clips are again noted in the left breast. There are nonenlarged and mildly enlarged left axillary lymph nodes measuring up to 1.4 x 1.0 cm. These have increased in size and number when compared to the prior examination. Nonenlarged right axillary lymph node is unchanged. CT ABDOMEN PELVIS FINDINGS Hepatobiliary: No focal liver abnormality is seen. No gallstones, gallbladder wall thickening, or biliary dilatation. Pancreas: Unremarkable. No pancreatic ductal dilatation or surrounding inflammatory changes. Spleen: Normal in size without focal abnormality. Adrenals/Urinary Tract: Adrenal glands are unremarkable. Kidneys are normal, without renal calculi, focal lesion, or hydronephrosis. Bladder is unremarkable. Stomach/Bowel: Stomach is within normal limits. Appendix appears normal. No evidence of bowel wall thickening, distention, or inflammatory changes. Vascular/Lymphatic: No significant vascular findings are present. No enlarged abdominal or pelvic lymph nodes. Reproductive: Uterus and bilateral adnexa are unremarkable. There are surgical clips in the bilateral adnexa similar to the prior study. Other: No abdominal wall hernia or abnormality. No  abdominopelvic ascites. Musculoskeletal: No acute or significant osseous findings. IMPRESSION: 1. Enlarging left breast mass with increasing left breast skin thickness concerning for worsening breast cancer. 2. Increasing/new left axillary lymphadenopathy worrisome for metastatic disease. 3. No other evidence for metastatic disease throughout the chest, abdomen or pelvis. Electronically Signed   By: Ronney Asters M.D.   On: 04/13/2022 16:35   CT Chest W Contrast  Result Date: 04/13/2022 CLINICAL DATA:  Breast cancer. EXAM: CT CHEST, ABDOMEN, AND PELVIS WITH CONTRAST TECHNIQUE: Multidetector CT imaging of the chest, abdomen and pelvis was performed following the standard protocol during bolus administration of intravenous contrast. RADIATION DOSE REDUCTION: This exam was performed according to the departmental dose-optimization program which includes automated exposure control, adjustment of the mA and/or kV according to patient size and/or use of iterative reconstruction technique. CONTRAST:  170m OMNIPAQUE IOHEXOL 300 MG/ML  SOLN COMPARISON:  CT chest abdomen and pelvis 01/28/2022 FINDINGS: CT CHEST FINDINGS Cardiovascular: Right chest port catheter tip ends in the right atrium. Heart and aorta are normal in size. There is no pericardial effusion. Mediastinum/Nodes: Visualized thyroid gland and esophagus are within normal limits. There are no enlarged mediastinal or hilar lymph nodes. Lungs/Pleura: Lungs are clear. No pleural effusion or pneumothorax. Musculoskeletal: No acute fracture or focal osseous lesion. Skin thickening of the left breast has increased. Masslike density in the left breast has increased in size now measuring 6.0 x 2.9 cm image 504/26 (previously 3.6 x 2.5 cm. Surgical clips are again noted in the left breast. There are nonenlarged and mildly enlarged left axillary lymph nodes measuring up to 1.4 x 1.0 cm. These have increased in size and number when compared to the prior examination.  Nonenlarged right axillary lymph node is unchanged. CT ABDOMEN PELVIS FINDINGS Hepatobiliary: No focal liver abnormality is seen. No gallstones, gallbladder wall thickening, or biliary dilatation. Pancreas: Unremarkable. No pancreatic ductal dilatation or surrounding inflammatory changes. Spleen: Normal in size without focal abnormality. Adrenals/Urinary Tract: Adrenal glands are unremarkable. Kidneys are normal, without renal calculi, focal lesion, or hydronephrosis. Bladder is unremarkable. Stomach/Bowel: Stomach is within normal limits. Appendix appears normal. No evidence of bowel wall thickening, distention, or inflammatory changes. Vascular/Lymphatic: No significant vascular findings are  present. No enlarged abdominal or pelvic lymph nodes. Reproductive: Uterus and bilateral adnexa are unremarkable. There are surgical clips in the bilateral adnexa similar to the prior study. Other: No abdominal wall hernia or abnormality. No abdominopelvic ascites. Musculoskeletal: No acute or significant osseous findings. IMPRESSION: 1. Enlarging left breast mass with increasing left breast skin thickness concerning for worsening breast cancer. 2. Increasing/new left axillary lymphadenopathy worrisome for metastatic disease. 3. No other evidence for metastatic disease throughout the chest, abdomen or pelvis. Electronically Signed   By: Ronney Asters M.D.   On: 04/13/2022 16:35   MM CLIP PLACEMENT RIGHT  Result Date: 04/12/2022 CLINICAL DATA:  Evaluate HydroMARK biopsy clip placement following ultrasound-guided RIGHT axillary lymph node biopsy. EXAM: 3D DIAGNOSTIC RIGHT MAMMOGRAM POST ULTRASOUND BIOPSY COMPARISON:  Previous exam(s). FINDINGS: 3D Mammographic images were obtained following ultrasound guided biopsy of a RIGHT axillary lymph node with mild cortical thickening. The [facility] biopsy marking clip is in expected position at the site of biopsy. IMPRESSION: Appropriate positioning of the HydroMARK shaped biopsy  marking clip at the site of biopsy in the low RIGHT axillary lymph node. Final Assessment: Post Procedure Mammograms for Marker Placement Electronically Signed   By: Margarette Canada M.D.   On: 04/12/2022 15:56  [place] AXILLARY NODE CORE BIOPSY RIGHT  Result Date: 04/12/2022 CLINICAL DATA:  46 year old female for tissue sampling of prominent RIGHT axillary lymph node. History of LEFT breast cancer and LEFT axillary lymph node metastasis. EXAM: [place] AXILLARY NODE CORE BIOPSY RIGHT COMPARISON:  Previous exam(s). PROCEDURE: Thomas met with the patient and we discussed the procedure of ultrasound-guided biopsy, including benefits and alternatives. We discussed the high likelihood of a successful procedure. We discussed the risks of the procedure, including infection, bleeding, tissue injury, clip migration, and inadequate sampling. Informed written consent was given. The usual time-out protocol was performed immediately prior to the procedure. Using sterile technique and 1% Lidocaine with and without epinephrine as local anesthetic, under direct ultrasound visualization, a 14 gauge spring-loaded device was used to perform biopsy of the RIGHT axillary lymph node with mild cortical thickening using a LATERAL approach. At the conclusion of the procedure a HydroMARK tissue marker clip was deployed into the biopsied lymph node, with satisfactory placement identified sonographically. Subsequent mammogram obtained evaluate clip placement. IMPRESSION: Ultrasound guided biopsy of RIGHT axillary lymph node. No apparent complications. Electronically Signed   By: Margarette Canada M.D.   On: 04/12/2022 15:41     ASSESSMENT and THERAPY PLAN:   Malignant neoplasm of upper-outer quadrant of left breast in female, estrogen receptor positive ([place]) [name] is a 46 year old Spanish-speaking woman who has a 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  She was most recently seen for ongoing breast redness, given a course of antibiotics but had  persistent redness hence we proceeded with a punch biopsy.  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  Pathology was sent to [facility] for second opinion as well. This confirmed triple negative breast cancer.  She progressed on after 2 cycles of TC hence we have switched her treatment to Enhertu.  Repeat biopsy demonstrated her HER2 to be 1+. After her last infusion, she came back to the clinic with increasing redness history of treated empirically for cellulitis, gave her a dose of IV vancomycin, oral doxycycline and she is here for follow-up.  Since last visit she had some staging scans again this did not show any evidence  of metastatic disease.  Right axillary lymph node biopsy showed benign changes.  Physical examination today shows some evidence of breast implants, the breast feels softer especially in the upper inner quadrant, redness appears more.  Overall at this time there is no concern for progression.  We have discussed about at least trying 2-3 cycles of Enhertu and reconvene about surgical planning.  She is agreeable to this.  Return to clinic as scheduled   All questions were answered. The patient knows to call the clinic with any problems, questions or concerns. We can certainly see the patient much sooner if necessary.  Total time spent: 30 minutes.  Certified Spanish interpreter was used for the entirety of this conversation  *Total Encounter Time as defined by the Centers for Medicare and Medicaid Services includes, in addition to the face-to-face time of a patient visit (documented in the note above) non-face-to-face time: obtaining and reviewing outside history, ordering and reviewing medications, tests or procedures, care coordination (communications with other health care professionals or caregivers) and documentation in the medical record.

## 2022-04-19 ENCOUNTER — Inpatient Hospital Stay: Payer: No Typology Code available for payment source | Admitting: Hematology and Oncology

## 2022-04-19 ENCOUNTER — Inpatient Hospital Stay: Payer: No Typology Code available for payment source

## 2022-04-21 ENCOUNTER — Ambulatory Visit: Payer: No Typology Code available for payment source

## 2022-04-25 MED FILL — Dexamethasone Sodium Phosphate Inj 100 MG/10ML: INTRAMUSCULAR | Qty: 1 | Status: AC

## 2022-04-26 ENCOUNTER — Other Ambulatory Visit: Payer: Self-pay

## 2022-04-26 ENCOUNTER — Encounter: Payer: Self-pay | Admitting: Hematology and Oncology

## 2022-04-26 ENCOUNTER — Inpatient Hospital Stay: Payer: No Typology Code available for payment source

## 2022-04-26 ENCOUNTER — Inpatient Hospital Stay (HOSPITAL_BASED_OUTPATIENT_CLINIC_OR_DEPARTMENT_OTHER): Payer: Self-pay | Admitting: Hematology and Oncology

## 2022-04-26 VITALS — BP 126/80 | HR 73 | Temp 98.3°F | Resp 20

## 2022-04-26 DIAGNOSIS — C50912 Malignant neoplasm of unspecified site of left female breast: Secondary | ICD-10-CM

## 2022-04-26 DIAGNOSIS — Z170 Estrogen receptor positive status [ER+]: Secondary | ICD-10-CM

## 2022-04-26 DIAGNOSIS — Z95828 Presence of other vascular implants and grafts: Secondary | ICD-10-CM

## 2022-04-26 DIAGNOSIS — C50412 Malignant neoplasm of upper-outer quadrant of left female breast: Secondary | ICD-10-CM

## 2022-04-26 LAB — CBC WITH DIFFERENTIAL/PLATELET
Abs Immature Granulocytes: 0.19 10*3/uL — ABNORMAL HIGH (ref 0.00–0.07)
Basophils Absolute: 0 10*3/uL (ref 0.0–0.1)
Basophils Relative: 0 %
Eosinophils Absolute: 0 10*3/uL (ref 0.0–0.5)
Eosinophils Relative: 0 %
HCT: 31.6 % — ABNORMAL LOW (ref 36.0–46.0)
Hemoglobin: 10.8 g/dL — ABNORMAL LOW (ref 12.0–15.0)
Immature Granulocytes: 3 %
Lymphocytes Relative: 13 %
Lymphs Abs: 1 10*3/uL (ref 0.7–4.0)
MCH: 28.3 pg (ref 26.0–34.0)
MCHC: 34.2 g/dL (ref 30.0–36.0)
MCV: 82.9 fL (ref 80.0–100.0)
Monocytes Absolute: 0.3 10*3/uL (ref 0.1–1.0)
Monocytes Relative: 4 %
Neutro Abs: 6 10*3/uL (ref 1.7–7.7)
Neutrophils Relative %: 80 %
Platelets: 288 10*3/uL (ref 150–400)
RBC: 3.81 MIL/uL — ABNORMAL LOW (ref 3.87–5.11)
RDW: 14.6 % (ref 11.5–15.5)
WBC: 7.4 10*3/uL (ref 4.0–10.5)
nRBC: 0 % (ref 0.0–0.2)

## 2022-04-26 LAB — CMP (CANCER CENTER ONLY)
ALT: 19 U/L (ref 0–44)
AST: 19 U/L (ref 15–41)
Albumin: 3.9 g/dL (ref 3.5–5.0)
Alkaline Phosphatase: 79 U/L (ref 38–126)
Anion gap: 9 (ref 5–15)
BUN: 18 mg/dL (ref 6–20)
CO2: 22 mmol/L (ref 22–32)
Calcium: 10.2 mg/dL (ref 8.9–10.3)
Chloride: 108 mmol/L (ref 98–111)
Creatinine: 1.05 mg/dL — ABNORMAL HIGH (ref 0.44–1.00)
GFR, Estimated: >60 mL/min (ref >60)
Glucose, Bld: 151 mg/dL — ABNORMAL HIGH (ref 70–99)
Potassium: 3.9 mmol/L (ref 3.5–5.1)
Sodium: 139 mmol/L (ref 135–145)
Total Bilirubin: 0.4 mg/dL (ref 0.3–1.2)
Total Protein: 7.3 g/dL (ref 6.5–8.1)

## 2022-04-26 MED ORDER — SODIUM CHLORIDE 0.9% FLUSH
10.0000 mL | Freq: Once | INTRAVENOUS | Status: AC
  Administered 2022-04-26: 10 mL

## 2022-04-26 MED ORDER — SODIUM CHLORIDE 0.9% FLUSH
10.0000 mL | INTRAVENOUS | Status: DC | PRN
  Administered 2022-04-26: 10 mL

## 2022-04-26 MED ORDER — DIPHENHYDRAMINE HCL 25 MG PO CAPS
50.0000 mg | ORAL_CAPSULE | Freq: Once | ORAL | Status: AC
  Administered 2022-04-26: 50 mg via ORAL

## 2022-04-26 MED ORDER — FAM-TRASTUZUMAB DERUXTECAN-NXKI CHEMO 100 MG IV SOLR
5.4000 mg/kg | Freq: Once | INTRAVENOUS | Status: AC
  Administered 2022-04-26: 462 mg via INTRAVENOUS
  Filled 2022-04-26: qty 23.1

## 2022-04-26 MED ORDER — ACETAMINOPHEN 325 MG PO TABS
650.0000 mg | ORAL_TABLET | Freq: Once | ORAL | Status: AC
  Administered 2022-04-26: 650 mg via ORAL

## 2022-04-26 MED ORDER — HEPARIN SOD (PORK) LOCK FLUSH 100 UNIT/ML IV SOLN
500.0000 [IU] | Freq: Once | INTRAVENOUS | Status: AC | PRN
  Administered 2022-04-26: 500 [IU]

## 2022-04-26 MED ORDER — ACETAMINOPHEN 325 MG PO TABS
ORAL_TABLET | ORAL | Status: AC
  Filled 2022-04-26: qty 2

## 2022-04-26 MED ORDER — PALONOSETRON HCL INJECTION 0.25 MG/5ML
INTRAVENOUS | Status: AC
  Filled 2022-04-26: qty 5

## 2022-04-26 MED ORDER — DIPHENHYDRAMINE HCL 25 MG PO CAPS
ORAL_CAPSULE | ORAL | Status: AC
  Filled 2022-04-26: qty 2

## 2022-04-26 MED ORDER — PALONOSETRON HCL INJECTION 0.25 MG/5ML
0.2500 mg | Freq: Once | INTRAVENOUS | Status: AC
  Administered 2022-04-26: 0.25 mg via INTRAVENOUS

## 2022-04-26 MED ORDER — DEXTROSE 5 % IV SOLN: Freq: Once | INTRAVENOUS | Status: AC

## 2022-04-26 MED ORDER — SODIUM CHLORIDE 0.9 % IV SOLN
10.0000 mg | Freq: Once | INTRAVENOUS | Status: AC
  Administered 2022-04-26: 10 mg via INTRAVENOUS
  Filled 2022-04-26: qty 10

## 2022-04-26 NOTE — Progress Notes (Signed)
[facility] Follow up:    [name] I, NP No address on file   DIAGNOSIS:  Cancer Staging  Malignant neoplasm of upper-outer quadrant of left breast in female, estrogen receptor positive ([place]) Staging form: Breast, AJCC 8th Edition - Clinical stage from 01/29/2020: Stage IIIA (cT2, cN1, cM0, G3, ER+, PR-, HER2-) - Unsigned Stage prefix: Initial diagnosis Histologic grading system: 3 grade system - Pathologic stage from 07/15/2020: No Stage Recommended (ypT1b, pN0, cM0, G3, ER-, PR-, HER2-) - Signed by Gardenia Phlegm, NP on 04/01/2022 Stage prefix: Post-therapy Histologic grading system: 3 grade system   SUMMARY OF ONCOLOGIC HISTORY: Oncology History  Malignant neoplasm of upper-outer quadrant of left breast in female, estrogen receptor positive ([place])  01/23/2020 Initial Diagnosis   [place] woman status post left breast upper outer quadrant biopsy 01/23/2020 for a clinical T3 N1, 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             (a) chest CT scan and bone scan 02/07/2020 showed no evidence of metastatic disease   02/07/2020 Genetic Testing   Negative genetic testing. No pathogenic variants identified on the [facility] Common Hereditary Cancers Panel. VUS in MSH6 called c.2744C>G identified. The report date is 02/07/2020.  The Common Hereditary Cancers Panel offered by [facility] includes sequencing and/or deletion duplication testing of the following 48 genes: APC, ATM, AXIN2, BARD1, BMPR1A, BRCA1, BRCA2, BRIP1, CDH1, CDKN2A (p14ARF), CDKN2A (p16INK4a), CKD4, CHEK2, CTNNA1, DICER1, EPCAM (Deletion/duplication testing only), GREM1 (promoter region deletion/duplication testing only), KIT, MEN1, MLH1, MSH2, MSH3, MSH6, MUTYH, NBN, NF1, NHTL1, PALB2, PDGFRA, PMS2, POLD1, POLE, PTEN, RAD50, RAD51C, RAD51D, RNF43, SDHB, SDHC, SDHD, SMAD4, SMARCA4. STK11, TP53, TSC1, TSC2, and VHL.  The following genes were  evaluated for sequence changes only: SDHA and HOXB13 c.251G>A variant only.   02/12/2020 - 05/28/2020 Neo-Adjuvant Chemotherapy   neoadjuvant chemotherapy consisting of doxorubicin and cyclophosphamide in dose dense fashion x4 started 02/12/2020, completed 03/25/2020, followed by paclitaxel and carboplatin weekly x12 started 04/08/2020 and completed on 05/28/2020             (a) echo 02/07/2020 shows an ejection fraction in the 55-60% range             (b) Epirubicin substituted for Doxorubicin starting with cycle 2 of AC secondary to allergic rash from Doxorubicin   07/15/2020 Surgery   status post left lumpectomy and sentinel lymph node sampling 07/15/2020 for a ypT1b ypN0 residual invasive ductal carcinoma, grade 3, with negative margins.             (a) a total of 4 left axillary lymph nodes were removed             (b) repeat prognostic panel on the final pathology again triple negative, with an MIB-1 of 50%.   07/15/2020 Cancer Staging   Staging form: Breast, AJCC 8th Edition - Pathologic stage from 07/15/2020: No Stage Recommended (ypT1b, pN0, cM0, G3, ER-, PR-, HER2-) - Signed by Gardenia Phlegm, NP on 04/01/2022 Stage prefix: Post-therapy Histologic grading system: 3 grade system   08/26/2020 - 10/09/2020 Radiation Therapy   adjuvant radiation completed 10/09/2020             (a) sensitizing capecitabine attempted but not tolerated by the patient  08/26/2020 through 10/09/2020 Site Technique Total Dose (Gy) Dose per Fx (Gy) Completed Fx Beam Energies  Breast, Left: Breast_Lt 3D 50/50 2 25/25 15X  Breast, Left: Breast_Lt_PAB_SCV 3D 50/50  2 25/25 10X, 15X  Breast, Left: Breast_Lt_Bst 3D 10/10 2 5/5 6X, 10X    11/23/2020 - 01/04/2021 Adjuvant Chemotherapy   adjuvant pembrolizumab started 11/23/2020, to continue for 1 year             (a) discontinued after 01/04/2021 dose with multiple side effects requiring steroids for resolution   03/30/2022 Mammogram   Mammogram and ultrasound  show numerous masses throughout left breast not visualized in 01/2022, +left axillary lymphadenopathy, skin thickening in right breast and in upper abdomen just below sternum   04/06/2022 -  Chemotherapy   Patient is on Treatment Plan : BREAST METASTATIC fam-trastuzumab deruxtecan-nxki (Enhertu) q21d     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  Prognostic panel sent to [facility]: Left breast skin, punch biopsy: Skin with dermal lymphatic involvement by carcinoma (confirmed with outside CK7 IHC, reviewed). Biomarkers are reported as negative for ER, PR, Her2/neu (confirmed on review but please note sample size is extremely small).   02/15/2022 - 03/08/2022 Chemotherapy    Taxotere/Cytoxan x 2, discontinued due to progression of breast.   03/30/2022 Mammogram   Mammogram and ultrasound show numerous masses throughout left breast not visualized in 01/2022, +left axillary lymphadenopathy, skin thickening in right breast and in upper abdomen just below sternum   04/06/2022 -  Chemotherapy   Patient is on Treatment Plan : BREAST METASTATIC fam-trastuzumab deruxtecan-nxki (Enhertu) q21d      CURRENT THERAPY: progression on Taxotere and cytoxan  INTERVAL HISTORY:  [name] 46 y.o. female returns for follow-up of her breast cancer.   She did well with Enhertu so far. She said the left breast was less heavy and tight, the redness disappeared from the medial part of right breast. She just noted that today the left breast was more red, she does not know if this is a reaction to any of the medication she took this morning.  She swears that it was not looking this way until this morning.  The back of her chest wall on the left side has been fading as well.  She had a cough for about a week but this has disappeared.  No shortness of breath. She otherwise appears to be in good spirits.  She is willing to try  second cycle of Enhertu. 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) 04/01/2022   Encounter for dental examination 03/11/2022   Acute apical periodontitis 03/11/2022   Phobia of dental procedure 03/11/2022   Defective dental restoration 03/11/2022   Accretions on teeth 03/11/2022   Chronic periodontitis 03/11/2022   Teeth missing 03/11/2022   Caries 03/11/2022   Generalized gingival recession 03/11/2022   Pain, dental 03/08/2022   Cancer-related pain 03/08/2022   Port-A-Cath in place 02/15/2022   Oral allergy syndrome, subsequent encounter 02/03/2022   Adverse effect of other drugs, medicaments and biological substances, subsequent encounter 02/02/2022   Other adverse food reactions, not elsewhere classified, subsequent encounter 02/02/2022   Chest tightness 02/02/2022   Other allergic rhinitis 02/02/2022   Allergic conjunctivitis of both eyes 02/02/2022   Language barrier 12/11/2020   Morbid obesity (HCC)    History of breast cancer    Genetic testing 02/13/2020   Malignant neoplasm of upper-outer quadrant of left breast in female, estrogen receptor positive (HCC) 01/27/2020  Impaired ability to use community resources due to language barrier 07/13/2013    is allergic to omnipaque [iohexol], doxorubicin hcl, keytruda [pembrolizumab], other, and tape.  MEDICAL HISTORY: Past Medical History:  Diagnosis Date   Allergy    Asthma    Breast cancer in female [facility])    Left   Breast disorder    breast cancer May 2021   Chronic back pain    History of breast cancer    History of COVID-19 04/20/2021   Hx of migraines    Morbid obesity ([place])    Personal history of chemotherapy    Personal history of radiation therapy     SURGICAL HISTORY: Past Surgical History:  Procedure Laterality Date   BREAST LUMPECTOMY     BREAST LUMPECTOMY WITH RADIOACTIVE SEED AND SENTINEL  LYMPH NODE BIOPSY Left 07/15/2020   Procedure: LEFT BREAST LUMPECTOMY WITH RADIOACTIVE SEED AND LEFT AXILLARY SENTINEL LYMPH NODE BIOPSY, LEFT AXILLARY NODE RADIOACTIVE SEED GUIDED EXCISION, LEFT BREAST RADIOACTIVE SEED GUIDED EXCISION IM NODE;  Surgeon: Rolm Bookbinder, MD;  Location: [facility];  Service: General;  Laterality: Left;  PEC BLOCK   CESAREAN SECTION WITH BILATERAL TUBAL LIGATION Bilateral 07/23/2015   Procedure: CESAREAN SECTION WITH BILATERAL TUBAL LIGATION;  Surgeon: Donnamae Jude, MD;  Location: [facility];  Service: Obstetrics;  Laterality: Bilateral;   IR IMAGING GUIDED PORT INSERTION  02/11/2022   PORT-A-CATH REMOVAL N/A 04/30/2021   Procedure: REMOVAL PORT-A-CATH;  Surgeon: Rolm Bookbinder, MD;  Location: [facility];  Service: General;  Laterality: N/A;   PORTACATH PLACEMENT Right 02/11/2020   Procedure: INSERTION PORT-A-CATH WITH ULTRASOUND GUIDANCE;  Surgeon: Rolm Bookbinder, MD;  Location: [facility];  Service: General;  Laterality: Right;   TUBAL LIGATION      SOCIAL HISTORY: Social History   Socioeconomic History   Marital status: Legally Separated    Spouse name: Not on file   Number of children: 3   Years of education: Not on file   Highest education level: 9th grade  Occupational History   Occupation: Architect  Tobacco Use   Smoking status: Never   Smokeless tobacco: Never  Vaping Use   Vaping Use: Never used  Substance and Sexual Activity   Alcohol use: No   Drug use: Never   Sexual activity: Yes    Birth control/protection: Surgical  Other Topics Concern   Not on file  Social History Narrative   Not on file   Social Determinants of Health   Financial Resource Strain: Not on file  Food Insecurity: No Food Insecurity (03/29/2022)   Hunger Vital Sign    Worried About Running Out of Food in the Last Year: Never true    Ran Out of Food in the Last Year: Never true  Transportation Needs: No Transportation Needs  (03/29/2022)   PRAPARE - Hydrologist (Medical): No    Lack of Transportation (Non-Medical): No  Physical Activity: Not on file  Stress: Not on file  Social Connections: Not on file  Intimate Partner Violence: Not on file    FAMILY HISTORY: Family History  Problem Relation Age of Onset   Heart disease Maternal Grandmother     Review of Systems  Constitutional:  Positive for fatigue. Negative for appetite change, chills, fever and unexpected weight change.  HENT:   Negative for hearing loss, lump/mass and trouble swallowing.   Eyes:  Negative for eye problems and icterus.  Respiratory:  Negative for chest tightness, cough  and shortness of breath.   Cardiovascular:  Negative for chest pain, leg swelling and palpitations.  Gastrointestinal:  Positive for nausea. Negative for abdominal distention, abdominal pain, constipation, diarrhea and vomiting.  Endocrine: Negative for hot flashes.  Genitourinary:  Negative for difficulty urinating.   Musculoskeletal:  Negative for arthralgias.  Skin:  Positive for rash. Negative for itching.  Neurological:  Negative for dizziness, extremity weakness, headaches and numbness.  Hematological:  Negative for adenopathy. Does not bruise/bleed easily.  Psychiatric/Behavioral:  Negative for depression. The patient is not nervous/anxious.       PHYSICAL EXAMINATION  ECOG PERFORMANCE STATUS: 1 - Symptomatic but completely ambulatory  Vitals:   04/26/22 1335  BP: 123/79  Pulse: 90  Resp: 18  Temp: 97.7 F (36.5 C)  SpO2: 97%     Physical Exam Constitutional:      General: She is not in acute distress.    Appearance: Normal appearance. She is not toxic-appearing.  HENT:     Head: Normocephalic and atraumatic.  Eyes:     General: No scleral icterus. Cardiovascular:     Rate and Rhythm: Normal rate and regular rhythm.     Pulses: Normal pulses.     Heart sounds: Normal heart sounds.  Pulmonary:     Effort:  Pulmonary effort is normal.     Breath sounds: Normal breath sounds.  Chest:     Comments: Please see pictures in media today.  The left breast appears a bit more erythematous especially in the upper inner quadrant area although patient swears that it was much better yesterday.  Redness in the middle of the chest as well as the medial side of the left breast appears to be better.  Redness in the posterior chest wall appears to be better.  The breast appears less swollen with some wrinkles consistent with less edema.  Abdominal:     General: Abdomen is flat. Bowel sounds are normal. There is no distension.     Palpations: Abdomen is soft.     Tenderness: There is no abdominal tenderness.  Musculoskeletal:        General: No swelling.     Cervical back: Neck supple.  Lymphadenopathy:     Cervical: No cervical adenopathy.  Skin:    General: Skin is warm and dry.     Findings: No rash.  Neurological:     General: No focal deficit present.     Mental Status: She is alert.  Psychiatric:        Mood and Affect: Mood normal.        Behavior: Behavior normal.     Media Information  7/28  LABORATORY DATA:  None today  RADIOGRAPHIC STUDIES:  No results found.    ASSESSMENT and THERAPY PLAN:   Malignant neoplasm of upper-outer quadrant of left breast in female, estrogen receptor positive ([place]) Lona is a 46 year old Spanish-speaking woman who has a 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  She was most recently seen for ongoing breast redness, given a course of antibiotics but had persistent redness hence we proceeded with a punch biopsy.  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  Pathology was sent to [facility] for second opinion as well. This confirmed triple negative breast cancer.  She progressed on after 2 cycles of TC hence we have switched her treatment to Enhertu.   Repeat biopsy demonstrated her HER2 to be 1+. After  her first infusion, she came back to the clinic with increasing redness history of treated empirically for cellulitis, gave her a dose of IV vancomycin, oral doxycycline She is now here before planned cycle 2 of Enhertu.  She tolerated Enhertu very well so far.  She had a week of cough but this has spontaneously disappeared and lung exam today is normal without any adventitious sounds.  She feels that the breast has been responding, it feels less heavy, less tight and the redness also improved until this morning when she noticed that the redness was a bit more in the left inner upper quadrant.  She wondered if this could be a reaction to any of the medication she took since it was looking much better until yesterday.  Otherwise she has definitely noticed an improvement in the redness on the medial side of the right breast. She is willing to proceed with Enhertu as planned today. We will plan to repeat imaging after 3 cycles of Enhertu.    All questions were answered. The patient knows to call the clinic with any problems, questions or concerns. We can certainly see the patient much sooner if necessary.  Total time spent: 30 minutes.  Certified Spanish interpreter was used for the entirety of this conversation  *Total Encounter Time as defined by the Centers for Medicare and Medicaid Services includes, in addition to the face-to-face time of a patient visit (documented in the note above) non-face-to-face time: obtaining and reviewing outside history, ordering and reviewing medications, tests or procedures, care coordination (communications with other health care professionals or caregivers) and documentation in the medical record.

## 2022-04-26 NOTE — Assessment & Plan Note (Addendum)
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  She was most recently seen for ongoing breast redness, given a course of antibiotics but had persistent redness hence we proceeded with a punch biopsy.  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  Pathology was sent to [facility] for second opinion as well. This confirmed triple negative breast cancer.  She progressed on after 2 cycles of TC hence we have switched her treatment to Enhertu.  Repeat biopsy demonstrated her HER2 to be 1+. After her first infusion, she came back to the clinic with increasing redness history of treated empirically for cellulitis, gave her a dose of IV vancomycin, oral doxycycline She is now here before planned cycle 2 of Enhertu.  She tolerated Enhertu very well so far.  She had a week of cough but this has spontaneously disappeared and lung exam today is normal without any adventitious sounds.  She feels that the breast has been responding, it feels less heavy, less tight and the redness also improved until this morning when she noticed that the redness was a bit more in the left inner upper quadrant.  She wondered if this could be a reaction to any of the medication she took since it was looking much better until yesterday.  Otherwise she has definitely noticed an improvement in the redness on the medial side of the right breast. She is willing to proceed with Enhertu as planned today. We will plan to repeat imaging after 3 cycles of Enhertu.

## 2022-04-26 NOTE — Patient Instructions (Signed)
[facility]COL[facility]  Discharge Instructions: Thank you for choosing [facility] to provide your oncology and hematology care.   If you have a lab appointment with the [facility], please go directly to the [facility] and check in at the registration area.   Wear comfortable clothing and clothing appropriate for easy access to any Portacath or PICC line.   We strive to give you quality time with your provider. You may need to reschedule your appointment if you arrive late (15 or more minutes).  Arriving late affects you and other patients whose appointments are after yours.  Also, if you miss three or more appointments without notifying the office, you may be dismissed from the clinic at the provider's discretion.      For prescription refill requests, have your pharmacy contact our office and allow 72 hours for refills to be completed.    Today you received the following chemotherapy and/or immunotherapy agents: Enhertu    To help prevent nausea and vomiting after your treatment, we encourage you to take your nausea medication as directed.  BELOW ARE SYMPTOMS THAT SHOULD BE REPORTED IMMEDIATELY: *FEVER GREATER THAN 100.4 F (38 C) OR HIGHER *CHILLS OR SWEATING *NAUSEA AND VOMITING THAT IS NOT CONTROLLED WITH YOUR NAUSEA MEDICATION *UNUSUAL SHORTNESS OF BREATH *UNUSUAL BRUISING OR BLEEDING *URINARY PROBLEMS (pain or burning when urinating, or frequent urination) *BOWEL PROBLEMS (unusual diarrhea, constipation, pain near the anus) TENDERNESS IN MOUTH AND THROAT WITH OR WITHOUT PRESENCE OF ULCERS (sore throat, sores in mouth, or a toothache) UNUSUAL RASH, SWELLING OR PAIN  UNUSUAL VAGINAL DISCHARGE OR ITCHING   Items with * indicate a potential emergency and should be followed up as soon as possible or go to the Emergency Department if any problems should occur.  Please show the CHEMOTHERAPY ALERT CARD or IMMUNOTHERAPY ALERT CARD at check-in to the  Emergency Department and triage nurse.  Should you have questions after your visit or need to cancel or reschedule your appointment, please contact [facility]  Dept: [phone]  and follow the prompts.  Office hours are 8:00 a.m. to 4:30 p.m. Monday - Friday. Please note that voicemails left after 4:00 p.m. may not be returned until the following business day.  We are closed weekends and major holidays. You have access to a nurse at all times for urgent questions. Please call the main number to the clinic Dept: [phone] and follow the prompts.   For any non-urgent questions, you may also contact your provider using MyChart. We now offer e-Visits for anyone 41 and older to request care online for non-urgent symptoms. For details visit [email or web address].   Also download the MyChart app! Go to the app store, search "MyChart", open the app, select [facility], and log in with your MyChart username and password.  Masks are optional in the cancer centers. If you would like for your care team to wear a mask while they are taking care of you, please let them know. For doctor visits, patients may have with them one support person who is at least 46 years old. At this time, visitors are not allowed in the infusion area.

## 2022-04-28 ENCOUNTER — Inpatient Hospital Stay: Payer: No Typology Code available for payment source

## 2022-05-01 NOTE — Progress Notes (Deleted)
Follow Up Note  RE: [name] MRN: [id number] DOB: 03-19-1976 Date of Office Visit: 05/02/2022  Referring provider: Teena Dunk, NP Primary care provider: Teena Dunk, NP  Chief Complaint: No chief complaint on file.  History of Present Illness: I had the pleasure of seeing [name] for a follow up visit at the [facility] on 05/01/2022. She is a 46 y.o. female, who is being followed for adverse drug reaction, allergic rhinoconjunctivitis, oral allergy syndrome and chest tightness. Her previous allergy office visit was on 02/02/2022 with Dr. Maudie Mercury. Today is a regular follow up visit.  Adverse effect of other drugs, medicaments and biological substances, subsequent encounter Reaction to IV contrast in the form of feeling warm, throat tightness and palpitations. Symptoms resolved without any meds. She had reaction to contrast a few months prior with throat itching and coughing but forgot to mention during the second contrast administration. Medical history significant for breast cancer. Discussed with patient that there is no skin testing or bloodwork to check for contrast allergy.  Her symptoms resolved without any medications.  Recommend pre-medication prior to future contrast use - if she has symptoms despite premedication then let us know.    Other allergic rhinitis Rhinitis symptoms around dust. No prior testing. Would like to get environmental testing done today. Today's skin prick testing showed: Positive to grass, weed, tree, dust mites. Start environmental control measures as below. Start Singulair (montelukast) '10mg'$  daily at night. Cautioned that in some children/adults can experience behavioral changes including hyperactivity, agitation, depression, sleep disturbances and suicidal ideations. These side effects are rare, but if you notice them you should notify me and discontinue Singulair (montelukast). Use Flonase (fluticasone)  nasal spray 1 spray per nostril twice a day as needed for nasal congestion.  Use olopatadine eye drops 0.2% once a day as needed for itchy/watery eyes. Use over the counter antihistamines such as Zyrtec (cetirizine), Claritin (loratadine), Allegra (fexofenadine), or Xyzal (levocetirizine) daily as needed. May take twice a day during allergy flares. May switch antihistamines every few months. Consider allergy injections for long term control if above medications do not help the symptoms - handout given.    Allergic conjunctivitis of both eyes See assessment and plan as above.   Oral allergy syndrome, subsequent encounter Noted perioral discomfort after eating apples, plums, peaches and almonds. Has not tried processed/cooked forms. Raw shrimp causes contact rash but can ingest cooked shrimp with no issues. Today's skin testing was negative to select foods.  Avoid foods that are bothersome and wear gloves when handling raw shrimp.  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 This is caused by cross reactivity of pollen with fresh fruits and vegetables, and nuts. Symptoms are usually localized in the form of itching and burning in mouth and throat. Very rarely it can progress to more severe symptoms. Eating foods in cooked or processed forms usually minimizes symptoms. I recommended avoidance of eating the problem foods, especially during the peak season(s). Sometimes, OFAS can induce severe throat swelling or even a systemic reaction; with such instance, I advised them to report to a local ER. A list of common pollens and food cross-reactivities was provided to the patient.  The major allergen in shellfish allergy is tropomyosin, a pan-allergen that is also found in house dust mites and cockroaches which can cause cross reactivity as well.    Chest tightness Patient concerned  for asthma as she noted chest tightness at times. Uses  albuterol prn with some benefit. Today's spirometry was normal with 4% improvement in FEV1 post bronchodilator treatment. Clinically feeling improved.  Discussed with patient that I'm not convinced that her chest tightness is due to her lungs. Some of her symptoms concerning for heartburn. Start omeprazole '20mg'$  daily in the morning for 1 month then stop. Gave handout on reflux in Spanish.  May use albuterol rescue inhaler 2 puffs every 4 to 6 hours as needed for shortness of breath, chest tightness, coughing, and wheezing. Monitor frequency of use.  If chest tightness not improved then recommend to see PCP to rule out cardiac issues.    Return in about 3 months (around 05/05/2022).  Assessment and Plan: [name] is a 46 y.o. female with: No problem-specific Assessment & Plan notes found for this encounter.  No follow-ups on file.  No orders of the defined types were placed in this encounter.  Lab Orders  No laboratory test(s) ordered today    Diagnostics: Spirometry:  Tracings reviewed. Her effort: {Blank single:19197::"Good reproducible efforts.","It was hard to get consistent efforts and there is a question as to whether this reflects a maximal maneuver.","Poor effort, data can not be interpreted."} FVC: ***L FEV1: ***L, ***% predicted FEV1/FVC ratio: ***% Interpretation: {Blank single:19197::"Spirometry consistent with mild obstructive disease","Spirometry consistent with moderate obstructive disease","Spirometry consistent with severe obstructive disease","Spirometry consistent with possible restrictive disease","Spirometry consistent with mixed obstructive and restrictive disease","Spirometry uninterpretable due to technique","Spirometry consistent with normal pattern","No overt abnormalities noted given today's efforts"}.  Please see scanned spirometry results for details.  Skin Testing: {Blank single:19197::"Select foods","Environmental allergy panel","Environmental allergy panel  and select foods","Food allergy panel","None","Deferred due to recent antihistamines use"}. *** Results discussed with patient/family.   Medication List:  Current Outpatient Medications  Medication Sig Dispense Refill  . albuterol (VENTOLIN HFA) 108 (90 Base) MCG/ACT inhaler Inhale 1-2 puffs into the lungs every 4 (four) hours as needed for wheezing or shortness of breath. 8 g 5  . amoxicillin-clavulanate (AUGMENTIN) 875-125 MG tablet Take 1 tablet by mouth 2 (two) times daily. (Patient not taking: Reported on 04/01/2022) 14 tablet 0  . atorvastatin (LIPITOR) 40 MG tablet Take 1 tablet (40 mg total) by mouth daily. (Patient not taking: Reported on 03/01/2022) 30 tablet 5  . benzonatate (TESSALON) 200 MG capsule Take 1 capsule (200 mg total) by mouth 2 (two) times daily as needed for cough. 20 capsule 0  . cyclobenzaprine (FLEXERIL) 10 MG tablet Take 1 tablet (10 mg total) by mouth 3 (three) times daily as needed for muscle spasms. (Patient not taking: Reported on 02/22/2022) 30 tablet 0  . dexamethasone (DECADRON) 4 MG tablet Take 2 tablets 2 times a day before chemo, none on chemo day, and then restart day after chemo two times a day for 3 days. 12 tablet 6  . diphenhydrAMINE (BENADRYL) 50 MG tablet 1 tablet 1 hour prior to CT scan 1 tablet 0  . EPINEPHrine 0.3 mg/0.3 mL IJ SOAJ injection Inject 0.3 mg into the muscle as needed for anaphylaxis (Throat swelling/difficulty breathing). (Patient not taking: Reported on 03/01/2022) 1 each 2  . fluticasone (FLONASE) 50 MCG/ACT nasal spray Place 1 spray into both nostrils 2 (two) times daily as needed (nasal congestion). 16 g 5  . montelukast (SINGULAIR) 10 MG tablet Take 1 tablet (10 mg total) by mouth at bedtime. 30 tablet 5  . Olopatadine HCl 0.2 % SOLN Apply 1 drop to eye daily as needed (itchy/watery eyes).  2.5 mL 5  . omeprazole (PRILOSEC OTC) 20 MG tablet Take 1 tablet (20 mg total) by mouth daily. (Patient not taking: Reported on 02/22/2022) 30  tablet 1  . ondansetron (ZOFRAN) 8 MG tablet May use 3 days after chemo if needed for nausea (Patient not taking: Reported on 03/01/2022) 20 tablet 3  . predniSONE (DELTASONE) 50 MG tablet Take 1 tablet by mouth at 13 hours, 7 hours, then 1 hour prior to CT scan. 3 tablet 0  . prochlorperazine (COMPAZINE) 10 MG tablet Take 1 tablet (10 mg total) by mouth every 8 (eight) hours as needed for nausea or vomiting. (Patient not taking: Reported on 03/01/2022) 30 tablet 0  . traMADol (ULTRAM) 50 MG tablet Take 1 tablet (50 mg total) by mouth every 8 (eight) hours as needed. (Patient not taking: Reported on 03/01/2022) 30 tablet 0   No current facility-administered medications for this visit.   Allergies: Allergies  Allergen Reactions  . Omnipaque [Iohexol] Other (See Comments)    Throat itching, some SOB and some chest pain post administration   . Doxorubicin Hcl Rash    Experience chest tightness, abdominal pain, and redness around lips during and shortly after doxorubicin bolus.   Marland Kitchen Keytruda [Pembrolizumab] Other (See Comments)    Lung irritation; skin breakout; swelling  . Other Itching    Almonds, cherries, peaches  . Tape    I reviewed her past medical history, social history, family history, and environmental history and no significant changes have been reported from her previous visit.  Review of Systems  Constitutional:  Negative for appetite change, chills, fever and unexpected weight change.  HENT:  Negative for congestion and rhinorrhea.   Eyes:  Negative for itching.  Respiratory:  Positive for chest tightness. Negative for cough, shortness of breath and wheezing.   Cardiovascular:  Negative for chest pain.  Gastrointestinal:  Negative for abdominal pain.  Genitourinary:  Negative for difficulty urinating.  Skin:  Negative for rash.  Allergic/Immunologic: Positive for environmental allergies.  Neurological:  Negative for headaches.   Objective: LMP 01/11/2019 (Exact Date)   There is no height or weight on file to calculate BMI. Physical Exam Vitals and nursing note reviewed.  Constitutional:      Appearance: Normal appearance. She is well-developed.  HENT:     Head: Normocephalic and atraumatic.     Right Ear: Tympanic membrane and external ear normal.     Left Ear: Tympanic membrane and external ear normal.     Nose: Nose normal.     Mouth/Throat:     Mouth: Mucous membranes are moist.     Pharynx: Oropharynx is clear.  Eyes:     Conjunctiva/sclera: Conjunctivae normal.  Cardiovascular:     Rate and Rhythm: Normal rate and regular rhythm.     Heart sounds: Normal heart sounds. No murmur heard.    No friction rub. No gallop.  Pulmonary:     Effort: Pulmonary effort is normal.     Breath sounds: Normal breath sounds. No wheezing, rhonchi or rales.  Musculoskeletal:     Cervical back: Neck supple.  Skin:    General: Skin is warm.     Findings: No rash.  Neurological:     Mental Status: She is alert and oriented to person, place, and time.  Psychiatric:        Behavior: Behavior normal.  Previous notes and tests were reviewed. The plan was reviewed with the patient/family, and all questions/concerned were addressed.  It was my  pleasure to see Rhys today and participate in her care. Please feel free to contact me with any questions or concerns.  Sincerely,  Rexene Alberts, DO Allergy & Immunology  [facility] office: [place] office: [phone]

## 2022-05-02 ENCOUNTER — Ambulatory Visit: Payer: Self-pay | Admitting: Allergy

## 2022-05-02 DIAGNOSIS — J309 Allergic rhinitis, unspecified: Secondary | ICD-10-CM

## 2022-05-02 DIAGNOSIS — T781XXD Other adverse food reactions, not elsewhere classified, subsequent encounter: Secondary | ICD-10-CM

## 2022-05-02 DIAGNOSIS — T50995D Adverse effect of other drugs, medicaments and biological substances, subsequent encounter: Secondary | ICD-10-CM

## 2022-05-02 DIAGNOSIS — H1010 Acute atopic conjunctivitis, unspecified eye: Secondary | ICD-10-CM

## 2022-05-03 ENCOUNTER — Inpatient Hospital Stay: Payer: No Typology Code available for payment source | Admitting: Hematology and Oncology

## 2022-05-05 ENCOUNTER — Encounter: Payer: Self-pay | Admitting: Hematology and Oncology

## 2022-05-05 ENCOUNTER — Inpatient Hospital Stay (HOSPITAL_BASED_OUTPATIENT_CLINIC_OR_DEPARTMENT_OTHER): Payer: No Typology Code available for payment source | Admitting: Hematology and Oncology

## 2022-05-05 ENCOUNTER — Other Ambulatory Visit: Payer: Self-pay

## 2022-05-05 VITALS — BP 114/68 | HR 90 | Temp 97.3°F | Resp 18 | Ht 60.0 in | Wt 184.8 lb

## 2022-05-05 DIAGNOSIS — C50412 Malignant neoplasm of upper-outer quadrant of left female breast: Secondary | ICD-10-CM

## 2022-05-05 DIAGNOSIS — C50912 Malignant neoplasm of unspecified site of left female breast: Secondary | ICD-10-CM

## 2022-05-05 DIAGNOSIS — C792 Secondary malignant neoplasm of skin: Secondary | ICD-10-CM

## 2022-05-05 DIAGNOSIS — Z170 Estrogen receptor positive status [ER+]: Secondary | ICD-10-CM

## 2022-05-05 NOTE — Assessment & Plan Note (Addendum)
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  She was most recently seen for ongoing breast redness, given a course of antibiotics but had persistent redness hence we proceeded with a punch biopsy.  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  Pathology was sent to [facility] for second opinion as well. This confirmed triple negative breast cancer.  She progressed on after 2 cycles of TC hence we have switched her treatment to Enhertu.  Repeat biopsy demonstrated her HER2 to be 1+. After her first infusion, she came back to the clinic with increasing redness history of treated empirically for cellulitis, gave her a dose of IV vancomycin, oral doxycycline She is now here before planned cycle 2 of Enhertu.  She has had robust significant response so far with decreased erythema, induration and edema of the left breast, medial portion of the right breast as well as midline.  She is willing to try Enhertu for couple more cycles.  Also ordered a repeat mammogram and ultrasound since this was the imaging which she had prior to the Enhertu. I will also discuss with [name] about possible surgical plans if she continues to have robust response since she has no evidence of other metastatic disease. With guards to the skin bumps that she shows me on her upper abdomen, this is very unlikely related to the breast cancer.  We will continue to monitor.  All pictures are in media for reference.

## 2022-05-05 NOTE — Progress Notes (Signed)
[facility] Follow up:    [name] I, NP No address on file   DIAGNOSIS:  Cancer Staging  Malignant neoplasm of upper-outer quadrant of left breast in female, estrogen receptor positive ([place]) Staging form: Breast, AJCC 8th Edition - Clinical stage from 01/29/2020: Stage IIIA (cT2, cN1, cM0, G3, ER+, PR-, HER2-) - Unsigned Stage prefix: Initial diagnosis Histologic grading system: 3 grade system - Pathologic stage from 07/15/2020: No Stage Recommended (ypT1b, pN0, cM0, G3, ER-, PR-, HER2-) - Signed by Gardenia Phlegm, NP on 04/01/2022 Stage prefix: Post-therapy Histologic grading system: 3 grade system   SUMMARY OF ONCOLOGIC HISTORY: Oncology History  Malignant neoplasm of upper-outer quadrant of left breast in female, estrogen receptor positive ([place])  01/23/2020 Initial Diagnosis   [place] woman status post left breast upper outer quadrant biopsy 01/23/2020 for a clinical T3 N1, 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             (a) chest CT scan and bone scan 02/07/2020 showed no evidence of metastatic disease   02/07/2020 Genetic Testing   Negative genetic testing. No pathogenic variants identified on the [facility] Common Hereditary Cancers Panel. VUS in MSH6 called c.2744C>G identified. The report date is 02/07/2020.  The Common Hereditary Cancers Panel offered by [facility] includes sequencing and/or deletion duplication testing of the following 48 genes: APC, ATM, AXIN2, BARD1, BMPR1A, BRCA1, BRCA2, BRIP1, CDH1, CDKN2A (p14ARF), CDKN2A (p16INK4a), CKD4, CHEK2, CTNNA1, DICER1, EPCAM (Deletion/duplication testing only), GREM1 (promoter region deletion/duplication testing only), KIT, MEN1, MLH1, MSH2, MSH3, MSH6, MUTYH, NBN, NF1, NHTL1, PALB2, PDGFRA, PMS2, POLD1, POLE, PTEN, RAD50, RAD51C, RAD51D, RNF43, SDHB, SDHC, SDHD, SMAD4, SMARCA4. STK11, TP53, TSC1, TSC2, and VHL.  The following genes were  evaluated for sequence changes only: SDHA and HOXB13 c.251G>A variant only.   02/12/2020 - 05/28/2020 Neo-Adjuvant Chemotherapy   neoadjuvant chemotherapy consisting of doxorubicin and cyclophosphamide in dose dense fashion x4 started 02/12/2020, completed 03/25/2020, followed by paclitaxel and carboplatin weekly x12 started 04/08/2020 and completed on 05/28/2020             (a) echo 02/07/2020 shows an ejection fraction in the 55-60% range             (b) Epirubicin substituted for Doxorubicin starting with cycle 2 of AC secondary to allergic rash from Doxorubicin   07/15/2020 Surgery   status post left lumpectomy and sentinel lymph node sampling 07/15/2020 for a ypT1b ypN0 residual invasive ductal carcinoma, grade 3, with negative margins.             (a) a total of 4 left axillary lymph nodes were removed             (b) repeat prognostic panel on the final pathology again triple negative, with an MIB-1 of 50%.   07/15/2020 Cancer Staging   Staging form: Breast, AJCC 8th Edition - Pathologic stage from 07/15/2020: No Stage Recommended (ypT1b, pN0, cM0, G3, ER-, PR-, HER2-) - Signed by Gardenia Phlegm, NP on 04/01/2022 Stage prefix: Post-therapy Histologic grading system: 3 grade system   08/26/2020 - 10/09/2020 Radiation Therapy   adjuvant radiation completed 10/09/2020             (a) sensitizing capecitabine attempted but not tolerated by the patient  08/26/2020 through 10/09/2020 Site Technique Total Dose (Gy) Dose per Fx (Gy) Completed Fx Beam Energies  Breast, Left: Breast_Lt 3D 50/50 2 25/25 15X  Breast, Left: Breast_Lt_PAB_SCV 3D 50/50  2 25/25 10X, 15X  Breast, Left: Breast_Lt_Bst 3D 10/10 2 5/5 6X, 10X    11/23/2020 - 01/04/2021 Adjuvant Chemotherapy   adjuvant pembrolizumab started 11/23/2020, to continue for 1 year             (a) discontinued after 01/04/2021 dose with multiple side effects requiring steroids for resolution   03/30/2022 Mammogram   Mammogram and ultrasound  show numerous masses throughout left breast not visualized in 01/2022, +left axillary lymphadenopathy, skin thickening in right breast and in upper abdomen just below sternum   04/06/2022 -  Chemotherapy   Patient is on Treatment Plan : BREAST METASTATIC fam-trastuzumab deruxtecan-nxki (Enhertu) q21d     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  Prognostic panel sent to [facility]: Left breast skin, punch biopsy: Skin with dermal lymphatic involvement by carcinoma (confirmed with outside CK7 IHC, reviewed). Biomarkers are reported as negative for ER, PR, Her2/neu (confirmed on review but please note sample size is extremely small).   02/15/2022 - 03/08/2022 Chemotherapy    Taxotere/Cytoxan x 2, discontinued due to progression of breast.   03/30/2022 Mammogram   Mammogram and ultrasound show numerous masses throughout left breast not visualized in 01/2022, +left axillary lymphadenopathy, skin thickening in right breast and in upper abdomen just below sternum   04/06/2022 -  Chemotherapy   Patient is on Treatment Plan : BREAST METASTATIC fam-trastuzumab deruxtecan-nxki (Enhertu) q21d      CURRENT THERAPY: progression on Taxotere and cytoxan  INTERVAL HISTORY:  [name] 46 y.o. female returns for follow-up of her breast cancer.   She did well with Enhertu so far. She has so far received 2 cycles of Enhertu and tolerated it really well. Her left breast has had remarkable response with decrease in size, edema and redness.  The posterior area on the right chest wall also appears less inflamed.  There is no more erythema in the midline or in the medial side of the left breast.  She has a couple skin nodules on the upper abdomen but she recently did some gardening work and not sure if she had any bites.  She complained of an episode of chest pain which resolved spontaneously.  Otherwise she reports some  fatigue, mild nausea with Enhertu.  No new cough or shortness of breath. 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[place]) 04/01/2022   Encounter for dental examination 03/11/2022   Acute apical periodontitis 03/11/2022   Phobia of dental procedure 03/11/2022   Defective dental restoration 03/11/2022   Accretions on teeth 03/11/2022   Chronic periodontitis 03/11/2022   Teeth missing 03/11/2022   Caries 03/11/2022   Generalized gingival recession 03/11/2022   Pain, dental 03/08/2022   Cancer-related pain 03/08/2022   Port-A-Cath in place 02/15/2022   Oral allergy syndrome, subsequent encounter 02/03/2022   Adverse effect of other drugs, medicaments and biological substances, subsequent encounter 02/02/2022   Other adverse food reactions, not elsewhere classified, subsequent encounter 02/02/2022   Chest tightness 02/02/2022   Other allergic rhinitis 02/02/2022   Allergic conjunctivitis of both eyes 02/02/2022   Language barrier 12/11/2020   Morbid obesity ([place])    History of breast cancer    Genetic testing 02/13/2020   Malignant neoplasm of upper-outer quadrant of left breast in female, estrogen receptor positive ([place]) 01/27/2020  Impaired ability to use community resources due to language barrier 07/13/2013    is allergic to omnipaque [iohexol], doxorubicin hcl, keytruda [pembrolizumab], other, and tape.  MEDICAL HISTORY: Past Medical History:  Diagnosis Date   Allergy    Asthma    Breast cancer in female [facility])    Left   Breast disorder    breast cancer May 2021   Chronic back pain    History of breast cancer    History of COVID-19 04/20/2021   Hx of migraines    Morbid obesity ([place])    Personal history of chemotherapy    Personal history of radiation therapy     SURGICAL HISTORY: Past Surgical History:  Procedure Laterality Date   BREAST LUMPECTOMY     BREAST  LUMPECTOMY WITH RADIOACTIVE SEED AND SENTINEL LYMPH NODE BIOPSY Left 07/15/2020   Procedure: LEFT BREAST LUMPECTOMY WITH RADIOACTIVE SEED AND LEFT AXILLARY SENTINEL LYMPH NODE BIOPSY, LEFT AXILLARY NODE RADIOACTIVE SEED GUIDED EXCISION, LEFT BREAST RADIOACTIVE SEED GUIDED EXCISION IM NODE;  Surgeon: Rolm Bookbinder, MD;  Location: [facility];  Service: General;  Laterality: Left;  PEC BLOCK   CESAREAN SECTION WITH BILATERAL TUBAL LIGATION Bilateral 07/23/2015   Procedure: CESAREAN SECTION WITH BILATERAL TUBAL LIGATION;  Surgeon: Donnamae Jude, MD;  Location: [facility];  Service: Obstetrics;  Laterality: Bilateral;   IR IMAGING GUIDED PORT INSERTION  02/11/2022   PORT-A-CATH REMOVAL N/A 04/30/2021   Procedure: REMOVAL PORT-A-CATH;  Surgeon: Rolm Bookbinder, MD;  Location: [facility];  Service: General;  Laterality: N/A;   PORTACATH PLACEMENT Right 02/11/2020   Procedure: INSERTION PORT-A-CATH WITH ULTRASOUND GUIDANCE;  Surgeon: Rolm Bookbinder, MD;  Location: [facility];  Service: General;  Laterality: Right;   TUBAL LIGATION      SOCIAL HISTORY: Social History   Socioeconomic History   Marital status: Legally Separated    Spouse name: Not on file   Number of children: 3   Years of education: Not on file   Highest education level: 9th grade  Occupational History   Occupation: Architect  Tobacco Use   Smoking status: Never   Smokeless tobacco: Never  Vaping Use   Vaping Use: Never used  Substance and Sexual Activity   Alcohol use: No   Drug use: Never   Sexual activity: Yes    Birth control/protection: Surgical  Other Topics Concern   Not on file  Social History Narrative   Not on file   Social Determinants of Health   Financial Resource Strain: Not on file  Food Insecurity: No Food Insecurity (03/29/2022)   Hunger Vital Sign    Worried About Running Out of Food in the Last Year: Never true    Ran Out of Food in the Last Year: Never true   Transportation Needs: No Transportation Needs (03/29/2022)   PRAPARE - Hydrologist (Medical): No    Lack of Transportation (Non-Medical): No  Physical Activity: Not on file  Stress: Not on file  Social Connections: Not on file  Intimate Partner Violence: Not on file    FAMILY HISTORY: Family History  Problem Relation Age of Onset   Heart disease Maternal Grandmother     Review of Systems  Constitutional:  Positive for fatigue. Negative for appetite change, chills, fever and unexpected weight change.  HENT:   Negative for hearing loss, lump/mass and trouble swallowing.   Eyes:  Negative for eye problems and icterus.  Respiratory:  Negative for chest tightness, cough  and shortness of breath.   Cardiovascular:  Negative for chest pain, leg swelling and palpitations.  Gastrointestinal:  Positive for nausea. Negative for abdominal distention, abdominal pain, constipation, diarrhea and vomiting.  Endocrine: Negative for hot flashes.  Genitourinary:  Negative for difficulty urinating.   Musculoskeletal:  Negative for arthralgias.  Skin:  Positive for rash. Negative for itching.  Neurological:  Negative for dizziness, extremity weakness, headaches and numbness.  Hematological:  Negative for adenopathy. Does not bruise/bleed easily.  Psychiatric/Behavioral:  Negative for depression. The patient is not nervous/anxious.       PHYSICAL EXAMINATION  ECOG PERFORMANCE STATUS: 1 - Symptomatic but completely ambulatory  Vitals:   05/05/22 1206  BP: 114/68  Pulse: 90  Resp: 18  Temp: (!) 97.3 F (36.3 C)  SpO2: 99%     Physical Exam Constitutional:      General: She is not in acute distress.    Appearance: Normal appearance. She is not toxic-appearing.  HENT:     Head: Normocephalic and atraumatic.  Eyes:     General: No scleral icterus. Cardiovascular:     Rate and Rhythm: Normal rate and regular rhythm.     Pulses: Normal pulses.     Heart  sounds: Normal heart sounds.  Pulmonary:     Effort: Pulmonary effort is normal.     Breath sounds: Normal breath sounds.  Chest:     Comments: Please see pictures in media today.   Significant response in the left breast with improved erythema, induration and skin tightening.  The left breast appears less edematous today.  There is no inflammation in the midline area in the medial portion of the right breast.  Posterior chest wall continues to have some abnormal density but fluctuates with regards to inflammation. Abdominal:     General: Abdomen is flat. Bowel sounds are normal. There is no distension.     Palpations: Abdomen is soft.     Tenderness: There is no abdominal tenderness.  Musculoskeletal:        General: No swelling.     Cervical back: Neck supple.  Lymphadenopathy:     Cervical: No cervical adenopathy.  Skin:    General: Skin is warm and dry.     Findings: No rash.  Neurological:     General: No focal deficit present.     Mental Status: She is alert.  Psychiatric:        Mood and Affect: Mood normal.        Behavior: Behavior normal.     Media Information  7/28  LABORATORY DATA:  None today  RADIOGRAPHIC STUDIES:  No results found.    ASSESSMENT and THERAPY PLAN:   Malignant neoplasm of upper-outer quadrant of left breast in female, estrogen receptor positive ([place]) Peyson is a 46 year old Spanish-speaking woman who has a 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  She was most recently seen for ongoing breast redness, given a course of antibiotics but had persistent redness hence we proceeded with a punch biopsy.  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  Pathology was sent to [facility] for second opinion as well. This confirmed triple negative breast cancer.  She progressed on after 2 cycles of TC hence we have switched her treatment to Enhertu.   Repeat biopsy demonstrated her HER2 to be 1+. After her first infusion, she came back to the clinic with increasing redness history of treated  empirically for cellulitis, gave her a dose of IV vancomycin, oral doxycycline She is now here before planned cycle 2 of Enhertu.  She has had robust significant response so far with decreased erythema, induration and edema of the left breast, medial portion of the right breast as well as midline.  She is willing to try Enhertu for couple more cycles.  Also ordered a repeat mammogram and ultrasound since this was the imaging which she had prior to the Enhertu. I will also discuss with Dr. Donne Hazel about possible surgical plans if she continues to have robust response since she has no evidence of other metastatic disease. With guards to the skin bumps that she shows me on her upper abdomen, this is very unlikely related to the breast cancer.  We will continue to monitor.  All pictures are in media for reference.   All questions were answered. The patient knows to call the clinic with any problems, questions or concerns. We can certainly see the patient much sooner if necessary.  Total time spent: 30 minutes.  Certified Spanish interpreter was used for the entirety of this conversation  *Total Encounter Time as defined by the Centers for Medicare and Medicaid Services includes, in addition to the face-to-face time of a patient visit (documented in the note above) non-face-to-face time: obtaining and reviewing outside history, ordering and reviewing medications, tests or procedures, care coordination (communications with other health care professionals or caregivers) and documentation in the medical record.

## 2022-05-06 ENCOUNTER — Other Ambulatory Visit: Payer: Self-pay

## 2022-05-08 ENCOUNTER — Other Ambulatory Visit: Payer: Self-pay | Admitting: Hematology and Oncology

## 2022-05-08 DIAGNOSIS — C50912 Malignant neoplasm of unspecified site of left female breast: Secondary | ICD-10-CM

## 2022-05-08 DIAGNOSIS — Z170 Estrogen receptor positive status [ER+]: Secondary | ICD-10-CM

## 2022-05-09 ENCOUNTER — Ambulatory Visit: Payer: Self-pay | Attending: General Surgery

## 2022-05-09 VITALS — Wt 184.5 lb

## 2022-05-09 DIAGNOSIS — Z483 Aftercare following surgery for neoplasm: Secondary | ICD-10-CM | POA: Insufficient documentation

## 2022-05-09 NOTE — Therapy (Signed)
OUTPATIENT PHYSICAL THERAPY [facility] SCREENING NOTE   Patient Name: [name] MRN: [id number] DOB:14-Nov-1975, 46 y.o., female Today's Date: 05/09/2022  PCP: [name] I, NP REFERRING PROVIDER: Rolm Bookbinder, MD   PT End of Session - 05/09/22 1630     Visit Number 3   # unchanged due to screen only   PT Start Time 1628    PT Stop Time 1635    PT Time Calculation (min) 7 min    Activity Tolerance Patient tolerated treatment well    Behavior During Therapy [facility] for tasks assessed/performed             Past Medical History:  Diagnosis Date   Allergy    Asthma    Breast cancer in female [facility])    Left   Breast disorder    breast cancer May 2021   Chronic back pain    History of breast cancer    History of COVID-19 04/20/2021   Hx of migraines    Morbid obesity ([place])    Personal history of chemotherapy    Personal history of radiation therapy    Past Surgical History:  Procedure Laterality Date   BREAST LUMPECTOMY     BREAST LUMPECTOMY WITH RADIOACTIVE SEED AND SENTINEL LYMPH NODE BIOPSY Left 07/15/2020   Procedure: LEFT BREAST LUMPECTOMY WITH RADIOACTIVE SEED AND LEFT AXILLARY SENTINEL LYMPH NODE BIOPSY, LEFT AXILLARY NODE RADIOACTIVE SEED GUIDED EXCISION, LEFT BREAST RADIOACTIVE SEED GUIDED EXCISION IM NODE;  Surgeon: Rolm Bookbinder, MD;  Location: [facility];  Service: General;  Laterality: Left;  PEC BLOCK   CESAREAN SECTION WITH BILATERAL TUBAL LIGATION Bilateral 07/23/2015   Procedure: CESAREAN SECTION WITH BILATERAL TUBAL LIGATION;  Surgeon: Donnamae Jude, MD;  Location: [facility];  Service: Obstetrics;  Laterality: Bilateral;   IR IMAGING GUIDED PORT INSERTION  02/11/2022   PORT-A-CATH REMOVAL N/A 04/30/2021   Procedure: REMOVAL PORT-A-CATH;  Surgeon: Rolm Bookbinder, MD;  Location: [place];  Service: General;  Laterality: N/A;   PORTACATH PLACEMENT Right 02/11/2020   Procedure: INSERTION PORT-A-CATH WITH ULTRASOUND GUIDANCE;   Surgeon: Rolm Bookbinder, MD;  Location: [facility];  Service: General;  Laterality: Right;   TUBAL LIGATION     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[place]) 04/01/2022   Encounter for dental examination 03/11/2022   Acute apical periodontitis 03/11/2022   Phobia of dental procedure 03/11/2022   Defective dental restoration 03/11/2022   Accretions on teeth 03/11/2022   Chronic periodontitis 03/11/2022   Teeth missing 03/11/2022   Caries 03/11/2022   Generalized gingival recession 03/11/2022   Pain, dental 03/08/2022   Cancer-related pain 03/08/2022   Port-A-Cath in place 02/15/2022   Oral allergy syndrome, subsequent encounter 02/03/2022   Adverse effect of other drugs, medicaments and biological substances, subsequent encounter 02/02/2022   Other adverse food reactions, not elsewhere classified, subsequent encounter 02/02/2022   Chest tightness 02/02/2022   Other allergic rhinitis 02/02/2022   Allergic conjunctivitis of both eyes 02/02/2022   Language barrier 12/11/2020   Morbid obesity ([place])    History of breast cancer    Genetic testing 02/13/2020   Malignant neoplasm of upper-outer quadrant of left breast in female, estrogen receptor positive ([place]) 01/27/2020   Impaired ability to use community resources due to language barrier 07/13/2013    REFERRING DIAG: left breast cancer at risk for lymphedema  THERAPY DIAG: Aftercare following surgery for neoplasm  PERTINENT HISTORY: Patient was  diagnosed on 01/23/2020 with left grade III invasive ductal carcinoma breast cancer. It is ER positive, PR negative, and HER2 negative with a Ki67 of 40%. She has 2 abnormal appearing lymph nodes and 1 was biopsied and found to be positive at time of diagnosis. Patient with interpreter present. She underwent neoadjuvant chemotherapy from 02/12/2020 - 05/28/2020. 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 She  underwent a left lumpectomy and sentinel node biopsy ( 4 negative nodes) on 07/15/2020. She will undergo radiation and anti-estrogen therapy. She reports her neuropathy has resolved and has some limitation in shoulder ROM.   PRECAUTIONS: left UE Lymphedema risk, None  SUBJECTIVE: Pt returns for her 3 month L-Dex screen.   PAIN:  Are you having pain? No, not currently  SOZO SCREENING: Patient was assessed today using the SOZO machine to determine the lymphedema index score. This was compared to her baseline score. It was determined that she is within the recommended range when compared to her baseline and no further action is needed at this time. She will continue SOZO screenings. These are done every 3 months for 2 years post operatively followed by every 6 months for 2 years, and then annually.    Otelia Limes, PTA 05/09/2022, 4:36 PM

## 2022-05-10 ENCOUNTER — Other Ambulatory Visit: Payer: Self-pay

## 2022-05-17 MED FILL — Dexamethasone Sodium Phosphate Inj 100 MG/10ML: INTRAMUSCULAR | Qty: 1 | Status: AC

## 2022-05-18 ENCOUNTER — Telehealth: Payer: Self-pay | Admitting: *Deleted

## 2022-05-18 ENCOUNTER — Inpatient Hospital Stay
Payer: No Typology Code available for payment source | Attending: Hematology and Oncology | Admitting: Hematology and Oncology

## 2022-05-18 ENCOUNTER — Ambulatory Visit (HOSPITAL_COMMUNITY)
Admission: RE | Admit: 2022-05-18 | Discharge: 2022-05-18 | Disposition: A | Discharge status: Home / Self Care | Payer: No Typology Code available for payment source | Source: Ambulatory Visit | Attending: Hematology and Oncology | Admitting: Hematology and Oncology

## 2022-05-18 ENCOUNTER — Inpatient Hospital Stay: Payer: No Typology Code available for payment source

## 2022-05-18 ENCOUNTER — Encounter: Payer: Self-pay | Admitting: Hematology and Oncology

## 2022-05-18 ENCOUNTER — Other Ambulatory Visit: Payer: Self-pay

## 2022-05-18 ENCOUNTER — Other Ambulatory Visit: Payer: Self-pay | Admitting: *Deleted

## 2022-05-18 VITALS — BP 110/72 | HR 72 | Temp 97.8°F | Resp 16 | Ht 60.0 in | Wt 185.3 lb

## 2022-05-18 DIAGNOSIS — Z8616 Personal history of COVID-19: Secondary | ICD-10-CM | POA: Insufficient documentation

## 2022-05-18 DIAGNOSIS — Z5111 Encounter for antineoplastic chemotherapy: Secondary | ICD-10-CM | POA: Insufficient documentation

## 2022-05-18 DIAGNOSIS — C50412 Malignant neoplasm of upper-outer quadrant of left female breast: Secondary | ICD-10-CM | POA: Insufficient documentation

## 2022-05-18 DIAGNOSIS — M7989 Other specified soft tissue disorders: Secondary | ICD-10-CM | POA: Insufficient documentation

## 2022-05-18 DIAGNOSIS — C792 Secondary malignant neoplasm of skin: Secondary | ICD-10-CM

## 2022-05-18 DIAGNOSIS — C50912 Malignant neoplasm of unspecified site of left female breast: Secondary | ICD-10-CM

## 2022-05-18 DIAGNOSIS — G893 Neoplasm related pain (acute) (chronic): Secondary | ICD-10-CM | POA: Insufficient documentation

## 2022-05-18 DIAGNOSIS — Z95828 Presence of other vascular implants and grafts: Secondary | ICD-10-CM

## 2022-05-18 DIAGNOSIS — Z923 Personal history of irradiation: Secondary | ICD-10-CM | POA: Insufficient documentation

## 2022-05-18 DIAGNOSIS — Z170 Estrogen receptor positive status [ER+]: Secondary | ICD-10-CM | POA: Insufficient documentation

## 2022-05-18 LAB — CBC WITH DIFFERENTIAL/PLATELET
Abs Immature Granulocytes: 0.05 10*3/uL (ref 0.00–0.07)
Basophils Absolute: 0 10*3/uL (ref 0.0–0.1)
Basophils Relative: 1 %
Eosinophils Absolute: 0.1 10*3/uL (ref 0.0–0.5)
Eosinophils Relative: 4 %
HCT: 33.4 % — ABNORMAL LOW (ref 36.0–46.0)
Hemoglobin: 11.2 g/dL — ABNORMAL LOW (ref 12.0–15.0)
Immature Granulocytes: 2 %
Lymphocytes Relative: 35 %
Lymphs Abs: 1.1 10*3/uL (ref 0.7–4.0)
MCH: 28.6 pg (ref 26.0–34.0)
MCHC: 33.5 g/dL (ref 30.0–36.0)
MCV: 85.2 fL (ref 80.0–100.0)
Monocytes Absolute: 0.3 10*3/uL (ref 0.1–1.0)
Monocytes Relative: 11 %
Neutro Abs: 1.5 10*3/uL — ABNORMAL LOW (ref 1.7–7.7)
Neutrophils Relative %: 47 %
Platelets: 261 10*3/uL (ref 150–400)
RBC: 3.92 MIL/uL (ref 3.87–5.11)
RDW: 15.9 % — ABNORMAL HIGH (ref 11.5–15.5)
WBC: 3 10*3/uL — ABNORMAL LOW (ref 4.0–10.5)
nRBC: 0 % (ref 0.0–0.2)

## 2022-05-18 LAB — COMPREHENSIVE METABOLIC PANEL
ALT: 16 U/L (ref 0–44)
AST: 15 U/L (ref 15–41)
Albumin: 4 g/dL (ref 3.5–5.0)
Alkaline Phosphatase: 75 U/L (ref 38–126)
Anion gap: 5 (ref 5–15)
BUN: 11 mg/dL (ref 6–20)
CO2: 27 mmol/L (ref 22–32)
Calcium: 9.6 mg/dL (ref 8.9–10.3)
Chloride: 108 mmol/L (ref 98–111)
Creatinine, Ser: 0.71 mg/dL (ref 0.44–1.00)
GFR, Estimated: >60 mL/min (ref >60)
Glucose, Bld: 106 mg/dL — ABNORMAL HIGH (ref 70–99)
Potassium: 3.9 mmol/L (ref 3.5–5.1)
Sodium: 140 mmol/L (ref 135–145)
Total Bilirubin: 0.3 mg/dL (ref 0.3–1.2)
Total Protein: 6.8 g/dL (ref 6.5–8.1)

## 2022-05-18 MED ORDER — SODIUM CHLORIDE 0.9% FLUSH
10.0000 mL | INTRAVENOUS | Status: DC | PRN
  Administered 2022-05-18: 10 mL

## 2022-05-18 MED ORDER — SODIUM CHLORIDE 0.9 % IV SOLN
10.0000 mg | Freq: Once | INTRAVENOUS | Status: AC
  Administered 2022-05-18: 10 mg via INTRAVENOUS
  Filled 2022-05-18: qty 10

## 2022-05-18 MED ORDER — ACETAMINOPHEN 325 MG PO TABS
650.0000 mg | ORAL_TABLET | Freq: Once | ORAL | Status: AC
  Administered 2022-05-18: 650 mg via ORAL
  Filled 2022-05-18: qty 2

## 2022-05-18 MED ORDER — DEXTROSE 5 % IV SOLN: Freq: Once | INTRAVENOUS | Status: AC

## 2022-05-18 MED ORDER — FAM-TRASTUZUMAB DERUXTECAN-NXKI CHEMO 100 MG IV SOLR
5.4000 mg/kg | Freq: Once | INTRAVENOUS | Status: AC
  Administered 2022-05-18: 452 mg via INTRAVENOUS
  Filled 2022-05-18: qty 22.6

## 2022-05-18 MED ORDER — SODIUM CHLORIDE 0.9% FLUSH
10.0000 mL | Freq: Once | INTRAVENOUS | Status: AC
  Administered 2022-05-18: 10 mL

## 2022-05-18 MED ORDER — DIPHENHYDRAMINE HCL 25 MG PO CAPS
50.0000 mg | ORAL_CAPSULE | Freq: Once | ORAL | Status: AC
  Administered 2022-05-18: 50 mg via ORAL
  Filled 2022-05-18: qty 2

## 2022-05-18 MED ORDER — HEPARIN SOD (PORK) LOCK FLUSH 100 UNIT/ML IV SOLN
500.0000 [IU] | Freq: Once | INTRAVENOUS | Status: AC | PRN
  Administered 2022-05-18: 500 [IU]

## 2022-05-18 MED ORDER — PALONOSETRON HCL INJECTION 0.25 MG/5ML
0.2500 mg | Freq: Once | INTRAVENOUS | Status: AC
  Administered 2022-05-18: 0.25 mg via INTRAVENOUS
  Filled 2022-05-18: qty 5

## 2022-05-18 NOTE — Assessment & Plan Note (Signed)
Left upper extremity swelling greater than right.  Ordered a vascular ultrasound stat.

## 2022-05-18 NOTE — Progress Notes (Signed)
[name] Follow up:    [name] I, NP [place] [address]   DIAGNOSIS:  Thomas Staging  Malignant neoplasm of upper-outer quadrant of left breast in female, estrogen receptor positive ([place]) Staging form: Breast, AJCC 8th Edition - Clinical stage from 01/29/2020: Stage IIIA (cT2, cN1, cM0, G3, ER+, PR-, HER2-) - Unsigned Stage prefix: Initial diagnosis Histologic grading system: 3 grade system - Pathologic stage from 07/15/2020: No Stage Recommended (ypT1b, pN0, cM0, G3, ER-, PR-, HER2-) - Signed by Gardenia Phlegm, NP on 04/01/2022 Stage prefix: Post-therapy Histologic grading system: 3 grade system   SUMMARY OF ONCOLOGIC HISTORY: Oncology History  Malignant neoplasm of upper-outer quadrant of left breast in female, estrogen receptor positive ([place])  01/23/2020 Initial Diagnosis   [name] status post left breast upper outer quadrant biopsy 01/23/2020 for a clinical T3 N1, 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             (a) chest CT scan and bone scan 02/07/2020 showed no evidence of metastatic disease   02/07/2020 Genetic Testing   Negative genetic testing. No pathogenic variants identified on the [facility] Common Hereditary Cancers Panel. VUS in MSH6 called c.2744C>G identified. The report date is 02/07/2020.  The Common Hereditary Cancers Panel offered by [facility] includes sequencing and/or deletion duplication testing of the following 48 genes: APC, ATM, AXIN2, BARD1, BMPR1A, BRCA1, BRCA2, BRIP1, CDH1, CDKN2A (p14ARF), CDKN2A (p16INK4a), CKD4, CHEK2, CTNNA1, DICER1, EPCAM (Deletion/duplication testing only), GREM1 (promoter region deletion/duplication testing only), KIT, MEN1, MLH1, MSH2, MSH3, MSH6, MUTYH, NBN, NF1, NHTL1, PALB2, PDGFRA, PMS2, POLD1, POLE, PTEN, RAD50, RAD51C, RAD51D, RNF43, SDHB, SDHC, SDHD, SMAD4, SMARCA4. STK11, TP53, TSC1, TSC2, and VHL.  The  following genes were evaluated for sequence changes only: SDHA and HOXB13 c.251G>A variant only.   02/12/2020 - 05/28/2020 Neo-Adjuvant Chemotherapy   neoadjuvant chemotherapy consisting of doxorubicin and cyclophosphamide in dose dense fashion x4 started 02/12/2020, completed 03/25/2020, followed by paclitaxel and carboplatin weekly x12 started 04/08/2020 and completed on 05/28/2020             (a) echo 02/07/2020 shows an ejection fraction in the 55-60% range             (b) Epirubicin substituted for Doxorubicin starting with cycle 2 of AC secondary to allergic rash from Doxorubicin   07/15/2020 Surgery   status post left lumpectomy and sentinel lymph node sampling 07/15/2020 for a ypT1b ypN0 residual invasive ductal carcinoma, grade 3, with negative margins.             (a) a total of 4 left axillary lymph nodes were removed             (b) repeat prognostic panel on the final pathology again triple negative, with an MIB-1 of 50%.   07/15/2020 Thomas Staging   Staging form: Breast, AJCC 8th Edition - Pathologic stage from 07/15/2020: No Stage Recommended (ypT1b, pN0, cM0, G3, ER-, PR-, HER2-) - Signed by Gardenia Phlegm, NP on 04/01/2022 Stage prefix: Post-therapy Histologic grading system: 3 grade system   08/26/2020 - 10/09/2020 Radiation Therapy   adjuvant radiation completed 10/09/2020             (a) sensitizing capecitabine attempted but not tolerated by the patient  08/26/2020 through 10/09/2020 Site Technique Total Dose (Gy) Dose per Fx (Gy) Completed Fx Beam Energies  Breast, Left: Breast_Lt 3D 50/50 2 25/25 15X  Breast, Left:  Breast_Lt_PAB_SCV 3D 50/50 2 25/25 10X, 15X  Breast, Left: Breast_Lt_Bst 3D 10/10 2 5/5 6X, 10X    11/23/2020 - 01/04/2021 Adjuvant Chemotherapy   adjuvant pembrolizumab started 11/23/2020, to continue for 1 year             (a) discontinued after 01/04/2021 dose with multiple side effects requiring steroids for resolution   03/30/2022 Mammogram    Mammogram and ultrasound show numerous masses throughout left breast not visualized in 01/2022, +left axillary lymphadenopathy, skin thickening in right breast and in upper abdomen just below sternum   04/05/2022 -  Chemotherapy   Patient is on Treatment Plan : BREAST METASTATIC Fam-Trastuzumab Deruxtecan-nxki (Enhertu) (5.4) q21d     04/06/2022 - 04/26/2022 Chemotherapy   Patient is on Treatment Plan : BREAST METASTATIC fam-trastuzumab deruxtecan-nxki (Enhertu) q21d     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  Prognostic panel sent to [facility]: Left breast skin, punch biopsy: Skin with dermal lymphatic involvement by carcinoma (confirmed with outside CK7 IHC, reviewed). Biomarkers are reported as negative for ER, PR, Her2/neu (confirmed on review but please note sample size is extremely small).   02/15/2022 - 03/08/2022 Chemotherapy    Taxotere/Cytoxan x 2, discontinued due to progression of breast.   03/30/2022 Mammogram   Mammogram and ultrasound show numerous masses throughout left breast not visualized in 01/2022, +left axillary lymphadenopathy, skin thickening in right breast and in upper abdomen just below sternum   04/05/2022 -  Chemotherapy   Patient is on Treatment Plan : BREAST METASTATIC Fam-Trastuzumab Deruxtecan-nxki (Enhertu) (5.4) q21d     04/06/2022 - 04/26/2022 Chemotherapy   Patient is on Treatment Plan : BREAST METASTATIC fam-trastuzumab deruxtecan-nxki (Enhertu) q21d      CURRENT THERAPY: progression on Taxotere and cytoxan  INTERVAL HISTORY:  Brystol Thomas 46 y.o. female returns for follow-up of her breast Thomas.   She continues to feel well with decreased swelling edema and heaviness of the left breast. The redness in the posterior chest wall fluctuates. Skin in the midline and medial right breast appears slightly indurated but less inflamed. She has noted some pain and  swelling of left upper extremity.  She tells me that the pain and the swelling of the left upper extremity has been there only for 1 week.  She otherwise worries about the fluctuating redness in the posterior chest wall.  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 ([place]) 04/01/2022   Encounter for dental examination 03/11/2022   Acute apical periodontitis 03/11/2022   Phobia of dental procedure 03/11/2022   Defective dental restoration 03/11/2022   Accretions on teeth 03/11/2022   Chronic periodontitis 03/11/2022   Teeth missing 03/11/2022   Caries 03/11/2022   Generalized gingival recession 03/11/2022   Pain, dental 03/08/2022   Thomas-related pain 03/08/2022   Port-A-Cath in place 02/15/2022   Oral allergy syndrome, subsequent encounter 02/03/2022   Adverse effect of other drugs, medicaments and biological substances, subsequent encounter 02/02/2022   Other adverse food reactions, not elsewhere classified, subsequent encounter 02/02/2022   Chest tightness 02/02/2022   Other allergic rhinitis 02/02/2022   Allergic conjunctivitis of both eyes 02/02/2022   Language barrier 12/11/2020   Morbid obesity ([place])  History of breast Thomas    Genetic testing 02/13/2020   Malignant neoplasm of upper-outer quadrant of left breast in female, estrogen receptor positive ([facility]) 01/27/2020   Impaired ability to use community resources due to language barrier 07/13/2013    is allergic to omnipaque [iohexol], doxorubicin hcl, keytruda [pembrolizumab], other, and tape.  MEDICAL HISTORY: Past Medical History:  Diagnosis Date   Allergy    Asthma    Breast Thomas in female [facility])    Left   Breast disorder    breast Thomas May 2021   Chronic back pain    History of breast Thomas     History of COVID-19 04/20/2021   Hx of migraines    Morbid obesity ([facility])    Personal history of chemotherapy    Personal history of radiation therapy     SURGICAL HISTORY: Past Surgical History:  Procedure Laterality Date   BREAST LUMPECTOMY     BREAST LUMPECTOMY WITH RADIOACTIVE SEED AND SENTINEL LYMPH NODE BIOPSY Left 07/15/2020   Procedure: LEFT BREAST LUMPECTOMY WITH RADIOACTIVE SEED AND LEFT AXILLARY SENTINEL LYMPH NODE BIOPSY, LEFT AXILLARY NODE RADIOACTIVE SEED GUIDED EXCISION, LEFT BREAST RADIOACTIVE SEED GUIDED EXCISION IM NODE;  Surgeon: Emelia Loron, MD;  Location: [facility] OR;  Service: General;  Laterality: Left;  PEC BLOCK   CESAREAN SECTION WITH BILATERAL TUBAL LIGATION Bilateral 07/23/2015   Procedure: CESAREAN SECTION WITH BILATERAL TUBAL LIGATION;  Surgeon: Reva Bores, MD;  Location: [facility];  Service: Obstetrics;  Laterality: Bilateral;   IR IMAGING GUIDED PORT INSERTION  02/11/2022   PORT-A-CATH REMOVAL N/A 04/30/2021   Procedure: REMOVAL PORT-A-CATH;  Surgeon: Emelia Loron, MD;  Location: [facility];  Service: General;  Laterality: N/A;   PORTACATH PLACEMENT Right 02/11/2020   Procedure: INSERTION PORT-A-CATH WITH ULTRASOUND GUIDANCE;  Surgeon: Emelia Loron, MD;  Location: [facility];  Service: General;  Laterality: Right;   TUBAL LIGATION      SOCIAL HISTORY: Social History   Socioeconomic History   Marital status: Legally Separated    Spouse name: Not on file   Number of children: 3   Years of education: Not on file   Highest education level: 9th grade  Occupational History   Occupation: Holiday representative  Tobacco Use   Smoking status: Never   Smokeless tobacco: Never  Vaping Use   Vaping Use: Never used  Substance and Sexual Activity   Alcohol use: No   Drug use: Never   Sexual activity: Yes    Birth control/protection: Surgical  Other Topics Concern   Not on file  Social History Narrative   Not on file    Social Determinants of Health   Financial Resource Strain: Not on file  Food Insecurity: No Food Insecurity (03/29/2022)   Hunger Vital Sign    Worried About Running Out of Food in the Last Year: Never true    Ran Out of Food in the Last Year: Never true  Transportation Needs: No Transportation Needs (03/29/2022)   PRAPARE - Administrator, Civil Service (Medical): No    Lack of Transportation (Non-Medical): No  Physical Activity: Not on file  Stress: Not on file  Social Connections: Not on file  Intimate Partner Violence: Not on file    FAMILY HISTORY: Family History  Problem Relation Age of Onset   Heart disease Maternal Grandmother     Review of Systems  Constitutional:  Positive for fatigue. Negative for appetite change, chills, fever and unexpected weight change.  HENT:   Negative for hearing loss, lump/mass and trouble swallowing.   Eyes:  Negative for eye problems and icterus.  Respiratory:  Negative for chest tightness, cough and shortness of breath.   Cardiovascular:  Negative for chest pain, leg swelling and palpitations.  Gastrointestinal:  Negative for abdominal distention, abdominal pain, constipation, diarrhea, nausea and vomiting.  Endocrine: Negative for hot flashes.  Genitourinary:  Negative for difficulty urinating.   Musculoskeletal:  Negative for arthralgias.  Skin:  Positive for rash. Negative for itching.  Neurological:  Negative for dizziness, extremity weakness, headaches and numbness.  Hematological:  Negative for adenopathy. Does not bruise/bleed easily.  Psychiatric/Behavioral:  Negative for depression. The patient is not nervous/anxious.       PHYSICAL EXAMINATION  ECOG PERFORMANCE STATUS: 1 - Symptomatic but completely ambulatory  Vitals:   05/18/22 0910  BP: 110/72  Pulse: 72  Resp: 16  Temp: 97.8 F (36.6 C)  SpO2: 100%   Physical Exam Constitutional:      Appearance: Normal appearance.  Pulmonary:     Effort:  Pulmonary effort is normal.     Breath sounds: Normal breath sounds.  Chest:     Comments: Significant response ongoing in the left breast with reduced induration and edema.  The redness in the midline on the medial part of the right breast is fluctuating.  Today it is less prominent.  Posterior chest wall redness again is fluctuating, today is a bit more prominent.  Palpable left axillary lymphadenopathy. Abdominal:     General: Abdomen is flat.     Palpations: Abdomen is soft.  Musculoskeletal:        General: Swelling ([name]) present. Normal range of motion.     Cervical back: Normal range of motion and neck supple. No rigidity.  Lymphadenopathy:     Cervical: No cervical adenopathy.  Neurological:     Mental Status: She is alert.      LABORATORY DATA:  None today  RADIOGRAPHIC STUDIES:  No results found.    ASSESSMENT and THERAPY PLAN:   Malignant neoplasm of upper-outer quadrant of left breast in female, estrogen receptor positive ([place]) [name] is a 46 year old Spanish-speaking woman who has a history of functionally triple negative breast Thomas.  She was most recently seen for ongoing breast redness, given a course of antibiotics but had persistent redness hence we proceeded with a punch biopsy.  This showed the presence of inflammatory breast Thomas, prognostic showed ER/PR and HER2 negative however proliferation index was noted low at 1%.  Pathology was sent to [facility] for second opinion as well. This confirmed triple negative breast Thomas.  She progressed on after 2 cycles of TC hence we have switched her treatment to Enhertu.   Repeat biopsy demonstrated her HER2 to be 1+. After her first infusion, she came back to the clinic with increasing redness history of treated empirically for cellulitis, gave her a dose of IV vancomycin, oral doxycycline She is now here before planned cycle 3 of Enhertu.  She has had robust response so far with decreased erythema, induration and  edema of the left breast, medial portion of the right breast as well as midline.  She is willing to try Enhertu for couple more cycles.  Also ordered a repeat mammogram and ultrasound since this was the imaging which she had prior to the Enhertu. This is scheduled for tomorrow. I will also discuss with Dr. Donne Hazel about possible surgical plans if she continues to have robust response since she has no  evidence of other metastatic disease. With guards to the skin bumps that she shows me on her upper abdomen,  Swelling of left upper extremity Left upper extremity swelling greater than right.  Ordered a vascular ultrasound stat.   All questions were answered. The patient knows to call the clinic with any problems, questions or concerns. We can certainly see the patient much sooner if necessary.  Total time spent: 30 minutes.  Certified Spanish interpreter was used for the entirety of this conversation  *Total Encounter Time as defined by the Centers for Medicare and Medicaid Services includes, in addition to the face-to-face time of a patient visit (documented in the note above) non-face-to-face time: obtaining and reviewing outside history, ordering and reviewing medications, tests or procedures, care coordination (communications with other health care professionals or caregivers) and documentation in the medical record.

## 2022-05-18 NOTE — Assessment & Plan Note (Signed)
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  She was most recently seen for ongoing breast redness, given a course of antibiotics but had persistent redness hence we proceeded with a punch biopsy.  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  Pathology was sent to [facility] for second opinion as well. This confirmed triple negative breast cancer.  She progressed on after 2 cycles of TC hence we have switched her treatment to Enhertu.   Repeat biopsy demonstrated her HER2 to be 1+. After her first infusion, she came back to the clinic with increasing redness history of treated empirically for cellulitis, gave her a dose of IV vancomycin, oral doxycycline She is now here before planned cycle 3 of Enhertu.  She has had robust response so far with decreased erythema, induration and edema of the left breast, medial portion of the right breast as well as midline.  She is willing to try Enhertu for couple more cycles.  Also ordered a repeat mammogram and ultrasound since this was the imaging which she had prior to the Enhertu. This is scheduled for tomorrow. I will also discuss with Dr. Donne Hazel about possible surgical plans if she continues to have robust response since she has no evidence of other metastatic disease. With guards to the skin bumps that she shows me on her upper abdomen,

## 2022-05-18 NOTE — Patient Instructions (Signed)
Instrucciones al darle de alta: Discharge Instructions Gracias por elegir al [facility] para brindarle atencin mdica de oncologa y hematologa.   Si usted tiene una cita de laboratorio con el [facility], por favor vaya directamente al [facility] y regstrese en el rea de registro.   Use ropa cmoda y adecuada para tener fcil acceso a las vas del Portacath (acceso venoso de larga duracin) o la lnea PICC (catter central colocado por va perifrica).   Nos esforzamos por ofrecerle tiempo de calidad con su proveedor. Es posible que tenga que volver a programar su cita si llega tarde (15 minutos o ms).  El llegar tarde le afecta a usted y a otros pacientes cuyas citas son posteriores a la suya.  Adems, si usted falta a tres o ms citas sin avisar a la oficina, puede ser retirado(a) de la clnica a discrecin del proveedor.      Para las solicitudes de renovacin de recetas, pida a su farmacia que se ponga en contacto con nuestra oficina y deje que transcurran 72 horas para que se complete el proceso de las renovaciones.    Hoy usted recibi los siguientes agentes de quimioterapia e/o inmunoterapia: Enhertu     Para ayudar a prevenir las nuseas y los vmitos despus de su tratamiento, le recomendamos que tome su medicamento para las nuseas segn las indicaciones.  LOS SNTOMAS QUE DEBEN COMUNICARSE INMEDIATAMENTE SE INDICAN A CONTINUACIN: *FIEBRE SUPERIOR A 100.4 F (38 C) O MS *ESCALOFROS O SUDORACIN *NUSEAS Y VMITOS QUE NO SE CONTROLAN CON EL MEDICAMENTO PARA LAS NUSEAS *DIFICULTAD INUSUAL PARA RESPIRAR  *MORETONES O HEMORRAGIAS NO HABITUALES *PROBLEMAS URINARIOS (dolor o ardor al orinar o frecuencia para orinar) *PROBLEMAS INTESTINALES (diarrea inusual, estreimiento, dolor cerca del ano) SENSIBILIDAD EN LA BOCA Y EN LA GARGANTA CON O SIN LA PRESENCIA DE LCERAS (dolor de garganta, llagas en la boca o dolor de muelas/dientes) ERUPCIN,  HINCHAZN O DOLORES INUSUALES FLUJO VAGINAL INUSUAL O PICAZN/RASQUIA    Los puntos marcados con un asterisco ( *) indican una posible emergencia y debe hacer un seguimiento tan pronto como le sea posible o vaya al Departamento de Emergencias si se le presenta algn problema.  Por favor, muestre la TARJETA DE ADVERTENCIA DE QUIMIOTERAPIA O LA TARJETA DE ADVERTENCIA DE INMUNOTERAPIA al registrarse en el Departamento de Emergencias y a la enfermera de triaje.  Si tiene preguntas despus de su visita o necesita cancelar o volver a programar su cita, por favor pngase en contacto con [facility]  Dept: [phone] y siga las instrucciones. Las horas de oficina son de 8:00 a.m. a 4:30 p.m. de lunes a viernes. Por favor, tenga en cuenta que los mensajes de voz que se dejan despus de las 4:00 p.m. posiblemente no se devolvern hasta el siguiente da de trabajo.  Cerramos los fines de semana y los das festivos importantes. En todo momento tiene acceso a una enfermera para preguntas urgentes. Por favor, llame al nmero principal de la clnica Dept: [phone] y siga las instrucciones.   Para cualquier pregunta que no sea de carcter urgente, tambin puede ponerse en contacto con su proveedor utilizando MyChart. Ahora ofrecemos visitas electrnicas para cualquier persona mayor de 18 aos que solicite atencin mdica en lnea para los sntomas que no sean urgentes. Para ms detalles vaya a [email or web address].   Tambin puede bajar la aplicacin de MyChart! Vaya a la tienda de aplicaciones, busque "MyChart", abra la aplicacin, seleccione [facility], e ingrese con su nombre de usuario y la contrasea de MyChart.  Las mscaras son opcionales en los centros de cncer. Si desea que su equipo de cuidados mdicos use una mscara mientras le atienden, por favor hgaselo saber al personal. Puede tener una persona de apoyo que tenga por lo menos 16 aos para que le acompae a sus  citas. 

## 2022-05-18 NOTE — Telephone Encounter (Signed)
Per call from [place] with [facility]- informed doppler is negative for a DVT.  Pt discharged home.

## 2022-05-19 ENCOUNTER — Encounter: Payer: Self-pay | Admitting: Hematology and Oncology

## 2022-05-19 ENCOUNTER — Ambulatory Visit
Admission: RE | Admit: 2022-05-19 | Discharge: 2022-05-19 | Disposition: A | Discharge status: Home / Self Care | Payer: No Typology Code available for payment source | Source: Ambulatory Visit | Attending: Hematology and Oncology | Admitting: Hematology and Oncology

## 2022-05-19 DIAGNOSIS — C50912 Malignant neoplasm of unspecified site of left female breast: Secondary | ICD-10-CM

## 2022-06-01 ENCOUNTER — Encounter: Payer: Self-pay | Admitting: Nurse Practitioner

## 2022-06-01 ENCOUNTER — Ambulatory Visit (INDEPENDENT_AMBULATORY_CARE_PROVIDER_SITE_OTHER): Payer: Self-pay | Admitting: Nurse Practitioner

## 2022-06-01 ENCOUNTER — Encounter: Payer: Self-pay | Admitting: Hematology and Oncology

## 2022-06-01 VITALS — BP 118/79 | HR 77 | Temp 98.1°F | Ht 60.0 in | Wt 184.6 lb

## 2022-06-01 DIAGNOSIS — L600 Ingrowing nail: Secondary | ICD-10-CM

## 2022-06-01 DIAGNOSIS — R300 Dysuria: Secondary | ICD-10-CM

## 2022-06-01 DIAGNOSIS — Z0000 Encounter for general adult medical examination without abnormal findings: Secondary | ICD-10-CM

## 2022-06-01 LAB — POCT URINALYSIS DIP (CLINITEK)
Bilirubin, UA: NEGATIVE
Blood, UA: NEGATIVE
Glucose, UA: NEGATIVE mg/dL
Ketones, POC UA: NEGATIVE mg/dL
Leukocytes, UA: NEGATIVE
Nitrite, UA: NEGATIVE
POC PROTEIN,UA: NEGATIVE
Spec Grav, UA: 1.02 (ref 1.010–1.025)
Urobilinogen, UA: 0.2 E.U./dL
pH, UA: 5 (ref 5.0–8.0)

## 2022-06-01 NOTE — Patient Instructions (Signed)
1. Burning with urination  - POCT URINALYSIS DIP (CLINITEK)  2. Routine health maintenance  - Vitamin D, 25-hydroxy - Lipid Panel  3. Ingrown toenail of right foot  - Ambulatory referral to Podiatry   Follow up:  Follow up in 3 months or sooner if needed

## 2022-06-01 NOTE — Assessment & Plan Note (Signed)
-   POCT URINALYSIS DIP (CLINITEK)  2. Routine health maintenance  - Vitamin D, 25-hydroxy - Lipid Panel  3. Ingrown toenail of right foot  - Ambulatory referral to Podiatry   Follow up:  Follow up in 3 months or sooner if needed

## 2022-06-01 NOTE — Progress Notes (Signed)
$'@Patient'G$  ID: [name], female    DOB: 1976-06-26, 46 y.o.   MRN: [id number]  Chief Complaint  Patient presents with   Follow-up    Pt is here for 3 months follow up visit. Pt states she wanted to know her vitamin D and cholesterol also patient has pain on the left side burning when urinating     Referring provider: No ref. provider found   HPI  [name] 46 y.o. female  has a past medical history of Allergy, Asthma, Breast cancer in female [facility]), Breast disorder, Chronic back pain, History of breast cancer, History of COVID-19 (04/20/2021), migraines, Morbid obesity ([place]), Personal history of chemotherapy, and Personal history of radiation therapy. To the [facility] for 3 mth follow up, productive cough and dental pain.   Patient presents today for follow up visit. She is currently under care of oncology for breast cancer. She states that she has had UTI symptoms with burning and frequency x 3 weeks. Will check UA today. Also needs a referral for podiatry. She is having issues with ingrown toenails because toenails came off with chemo, but are growing back now. Patient does need an eye appointment will give list of eye doctors.  Denies f/c/s, n/v/d, hemoptysis, PND, leg swelling Denies chest pain or edema    Allergies  Allergen Reactions   Omnipaque [Iohexol] Other (See Comments)    Throat itching, some SOB and some chest pain post administration    Doxorubicin Hcl Rash    Experience chest tightness, abdominal pain, and redness around lips during and shortly after doxorubicin bolus.    Keytruda [Pembrolizumab] Other (See Comments)    Lung irritation; skin breakout; swelling   Other Itching    Almonds, cherries, peaches   Tape     Immunization History  Administered Date(s) Administered   Influenza,inj,Quad PF,6+ Mos 11/01/2016   Janssen (J&J) SARS-COV-2 Vaccination 01/31/2020   PFIZER(Purple Top)SARS-COV-2 Vaccination 08/19/2020   Unspecified SARS-COV-2  Vaccination 11/25/2020    Past Medical History:  Diagnosis Date   Allergy    Asthma    Breast cancer in female [facility])    Left   Breast disorder    breast cancer May 2021   Chronic back pain    History of breast cancer    History of COVID-19 04/20/2021   Hx of migraines    Morbid obesity ([place])    Personal history of chemotherapy    Personal history of radiation therapy     Tobacco History: Social History   Tobacco Use  Smoking Status Never  Smokeless Tobacco Never   Counseling given: Not Answered   Outpatient Encounter Medications as of 06/01/2022  Medication Sig   albuterol (VENTOLIN HFA) 108 (90 Base) MCG/ACT inhaler Inhale 1-2 puffs into the lungs every 4 (four) hours as needed for wheezing or shortness of breath.   atorvastatin (LIPITOR) 40 MG tablet Take 1 tablet (40 mg total) by mouth daily. (Patient not taking: Reported on 03/01/2022)   benzonatate (TESSALON) 200 MG capsule Take 1 capsule (200 mg total) by mouth 2 (two) times daily as needed for cough. (Patient not taking: Reported on 06/01/2022)   cyclobenzaprine (FLEXERIL) 10 MG tablet Take 1 tablet (10 mg total) by mouth 3 (three) times daily as needed for muscle spasms. (Patient not taking: Reported on 02/22/2022)   dexamethasone (DECADRON) 4 MG tablet Take 2 tablets 2 times a day before chemo, none on chemo day, and then restart day after chemo two times a day  for 3 days. (Patient not taking: Reported on 06/01/2022)   diphenhydrAMINE (BENADRYL) 50 MG tablet 1 tablet 1 hour prior to CT scan (Patient not taking: Reported on 06/01/2022)   EPINEPHrine 0.3 mg/0.3 mL IJ SOAJ injection Inject 0.3 mg into the muscle as needed for anaphylaxis (Throat swelling/difficulty breathing). (Patient not taking: Reported on 03/01/2022)   fluticasone (FLONASE) 50 MCG/ACT nasal spray Place 1 spray into both nostrils 2 (two) times daily as needed (nasal congestion). (Patient not taking: Reported on 06/01/2022)   montelukast (SINGULAIR) 10 MG  tablet Take 1 tablet (10 mg total) by mouth at bedtime. (Patient not taking: Reported on 06/01/2022)   Olopatadine HCl 0.2 % SOLN Apply 1 drop to eye daily as needed (itchy/watery eyes).   omeprazole (PRILOSEC OTC) 20 MG tablet Take 1 tablet (20 mg total) by mouth daily. (Patient not taking: Reported on 02/22/2022)   ondansetron (ZOFRAN) 8 MG tablet May use 3 days after chemo if needed for nausea (Patient not taking: Reported on 03/01/2022)   predniSONE (DELTASONE) 50 MG tablet Take 1 tablet by mouth at 13 hours, 7 hours, then 1 hour prior to CT scan. (Patient not taking: Reported on 06/01/2022)   prochlorperazine (COMPAZINE) 10 MG tablet Take 1 tablet (10 mg total) by mouth every 8 (eight) hours as needed for nausea or vomiting. (Patient not taking: Reported on 03/01/2022)   traMADol (ULTRAM) 50 MG tablet Take 1 tablet (50 mg total) by mouth every 8 (eight) hours as needed. (Patient not taking: Reported on 03/01/2022)   [DISCONTINUED] amoxicillin-clavulanate (AUGMENTIN) 875-125 MG tablet Take 1 tablet by mouth 2 (two) times daily. (Patient not taking: Reported on 04/01/2022)   No facility-administered encounter medications on file as of 06/01/2022.     Review of Systems  Review of Systems  Constitutional: Negative.   HENT: Negative.    Cardiovascular: Negative.   Gastrointestinal: Negative.   Genitourinary:  Positive for dysuria and frequency.  Skin:        Ingrown toenail  Allergic/Immunologic: Negative.   Neurological: Negative.   Psychiatric/Behavioral: Negative.         Physical Exam  BP 118/79 (BP Location: Right Arm, Patient Position: Sitting, Cuff Size: Large)   Pulse 77   Temp 98.1 F (36.7 C)   Ht 5' (1.524 m)   Wt 184 lb 9.6 oz (83.7 kg)   LMP 01/11/2019 (Exact Date)   SpO2 100%   BMI 36.05 kg/m   Wt Readings from Last 5 Encounters:  06/01/22 184 lb 9.6 oz (83.7 kg)  05/18/22 185 lb 4.8 oz (84.1 kg)  05/09/22 184 lb 8 oz (83.7 kg)  05/05/22 184 lb 12.8 oz (83.8 kg)   04/26/22 184 lb 14.4 oz (83.9 kg)     Physical Exam Vitals and nursing note reviewed.  Constitutional:      General: She is not in acute distress.    Appearance: She is well-developed.  Cardiovascular:     Rate and Rhythm: Normal rate and regular rhythm.  Pulmonary:     Effort: Pulmonary effort is normal.     Breath sounds: Normal breath sounds.  Neurological:     Mental Status: She is alert and oriented to person, place, and time.      Lab Results:  CBC    Component Value Date/Time   WBC 3.0 (L) 05/18/2022 0852   RBC 3.92 05/18/2022 0852   HGB 11.2 (L) 05/18/2022 0852   HGB 10.8 (L) 03/08/2022 0820   HGB 13.3 10/01/2021 1004  HCT 33.4 (L) 05/18/2022 0852   HCT 40.4 10/01/2021 1004   PLT 261 05/18/2022 0852   PLT 259 03/08/2022 0820   PLT 212 10/01/2021 1004   MCV 85.2 05/18/2022 0852   MCV 87 10/01/2021 1004   MCH 28.6 05/18/2022 0852   MCHC 33.5 05/18/2022 0852   RDW 15.9 (H) 05/18/2022 0852   RDW 13.3 10/01/2021 1004   LYMPHSABS 1.1 05/18/2022 0852   LYMPHSABS 1.6 10/01/2021 1004   MONOABS 0.3 05/18/2022 0852   EOSABS 0.1 05/18/2022 0852   EOSABS 0.1 10/01/2021 1004   BASOSABS 0.0 05/18/2022 0852   BASOSABS 0.0 10/01/2021 1004    BMET    Component Value Date/Time   NA 140 05/18/2022 0852   NA 141 10/01/2021 1004   K 3.9 05/18/2022 0852   CL 108 05/18/2022 0852   CO2 27 05/18/2022 0852   GLUCOSE 106 (H) 05/18/2022 0852   BUN 11 05/18/2022 0852   BUN 10 10/01/2021 1004   CREATININE 0.71 05/18/2022 0852   CREATININE 1.05 (H) 04/26/2022 1315   CALCIUM 9.6 05/18/2022 0852   GFRNONAA >60 05/18/2022 0852   GFRNONAA >60 04/26/2022 1315   GFRAA >60 06/03/2020 0949   GFRAA >60 02/26/2020 0915    BNP    Component Value Date/Time   BNP 15.2 09/25/2020 1624    ProBNP No results found for: "PROBNP"  Imaging: MM DIAG BREAST TOMO BILATERAL  Result Date: 05/19/2022 CLINICAL DATA:  Follow-up known left breast cancer. EXAM: DIGITAL DIAGNOSTIC  BILATERAL MAMMOGRAM WITH TOMOSYNTHESIS; ULTRASOUND LEFT BREAST LIMITED TECHNIQUE: Bilateral digital diagnostic mammography and breast tomosynthesis was performed.; Targeted ultrasound examination of the left breast was performed. COMPARISON:  Previous exam(s). ACR Breast Density Category b: There are scattered areas of fibroglandular density. FINDINGS: The right skin thickening identified on the previous study has resolved. No suspicious masses, calcifications, or distortion identified in the right breast. Skin thickening remains on the left but has significantly improved in the interval. The multiple obscured masses in the left breast have improved in the interval. In particular, there is less medial extension. No other suspicious mammographic findings on the left. Targeted ultrasound is performed, showing continued masses in the left breast which have improved in the interval. The mass at 11 o'clock, 2 cm from the nipple measures 1.7 x 0.6 x 1.3 cm today versus 3.3 x 1.7 x 3.5 cm previously. Mass at 12 o'clock, 2 cm from the nipple measures 2.9 by 2.6 x 1.3 cm today versus 4.8 by 4.1 x 2.0 cm previously. The mass at 2 o'clock, 4 cm from the nipple measures 1.7 by 2.3 x 1.4 cm today versus 2.7 by 3.0 x 2.7 cm previously. The mass at 3 o'clock, 4 cm from the nipple measures 2.4 x 0.6 by 2.6 cm today versus 3.3 x 2.1 x 4.6 cm previously. Both previously identified axillary lymph nodes are significantly improved. One of the nodes contains a HydroMARK biopsy clip. One of the nodes demonstrates a cortex measuring 3.5 mm today and the other demonstrates a cortex measuring 3.5 mm today. The lymph nodes are now borderline in appearance. IMPRESSION: Persistent but significantly improved malignancy in the left breast as above. Each of the 4 masses measured in the left breast previously are smaller. The left axillary nodes are improved. The left skin thickening persists but is improved. The right skin thickening has resolved.  No evidence of malignancy in the right breast. RECOMMENDATION: Recommend continued surgical and oncologic follow up. I have discussed the findings and  recommendations with the patient. If applicable, a reminder letter will be sent to the patient regarding the next appointment. BI-RADS CATEGORY  6: Known biopsy-proven malignancy. Electronically Signed   By: Dorise Bullion III M.D.   On: 05/19/2022 17:01  US BREAST LTD UNI LEFT INC AXILLA  Result Date: 05/19/2022 CLINICAL DATA:  Follow-up known left breast cancer. EXAM: DIGITAL DIAGNOSTIC BILATERAL MAMMOGRAM WITH TOMOSYNTHESIS; ULTRASOUND LEFT BREAST LIMITED TECHNIQUE: Bilateral digital diagnostic mammography and breast tomosynthesis was performed.; Targeted ultrasound examination of the left breast was performed. COMPARISON:  Previous exam(s). ACR Breast Density Category b: There are scattered areas of fibroglandular density. FINDINGS: The right skin thickening identified on the previous study has resolved. No suspicious masses, calcifications, or distortion identified in the right breast. Skin thickening remains on the left but has significantly improved in the interval. The multiple obscured masses in the left breast have improved in the interval. In particular, there is less medial extension. No other suspicious mammographic findings on the left. Targeted ultrasound is performed, showing continued masses in the left breast which have improved in the interval. The mass at 11 o'clock, 2 cm from the nipple measures 1.7 x 0.6 x 1.3 cm today versus 3.3 x 1.7 x 3.5 cm previously. Mass at 12 o'clock, 2 cm from the nipple measures 2.9 by 2.6 x 1.3 cm today versus 4.8 by 4.1 x 2.0 cm previously. The mass at 2 o'clock, 4 cm from the nipple measures 1.7 by 2.3 x 1.4 cm today versus 2.7 by 3.0 x 2.7 cm previously. The mass at 3 o'clock, 4 cm from the nipple measures 2.4 x 0.6 by 2.6 cm today versus 3.3 x 2.1 x 4.6 cm previously. Both previously identified axillary lymph  nodes are significantly improved. One of the nodes contains a HydroMARK biopsy clip. One of the nodes demonstrates a cortex measuring 3.5 mm today and the other demonstrates a cortex measuring 3.5 mm today. The lymph nodes are now borderline in appearance. IMPRESSION: Persistent but significantly improved malignancy in the left breast as above. Each of the 4 masses measured in the left breast previously are smaller. The left axillary nodes are improved. The left skin thickening persists but is improved. The right skin thickening has resolved. No evidence of malignancy in the right breast. RECOMMENDATION: Recommend continued surgical and oncologic follow up. I have discussed the findings and recommendations with the patient. If applicable, a reminder letter will be sent to the patient regarding the next appointment. BI-RADS CATEGORY  6: Known biopsy-proven malignancy. Electronically Signed   By: Dorise Bullion III M.D.   On: 05/19/2022 17:01  VAS [place] UPPER EXTREMITY VENOUS DUPLEX  Result Date: 05/18/2022 UPPER VENOUS STUDY  Patient Name:  [name]  Date of Exam:   05/18/2022 Medical Rec #: [id number]              Accession #:    [id number] Date of Birth: Jul 04, 1976              Patient Gender: F Patient Age:   32 years Exam Location:  [facility] Procedure:      VAS [place] UPPER EXTREMITY VENOUS DUPLEX Referring Phys: Arletha Pili IRUKU --------------------------------------------------------------------------------  Indications: Swelling Risk Factors: Breast cancer, on chemotherapy. Comparison Study: No previous exams Performing Technologist: Jody Hill RVT, RDMS  Examination Guidelines: A complete evaluation includes B-mode imaging, spectral Doppler, color Doppler, and power Doppler as needed of all accessible portions of each vessel. Bilateral testing is considered an integral part  of a complete examination. Limited examinations for reoccurring indications may be performed as noted.  Right Findings:  +----------+------------+---------+-----------+----------+-------+ RIGHT     CompressiblePhasicitySpontaneousPropertiesSummary +----------+------------+---------+-----------+----------+-------+ Subclavian               Yes       Yes                      +----------+------------+---------+-----------+----------+-------+  Left Findings: +----------+------------+---------+-----------+----------+-------+ LEFT      CompressiblePhasicitySpontaneousPropertiesSummary +----------+------------+---------+-----------+----------+-------+ IJV           Full       Yes       Yes                      +----------+------------+---------+-----------+----------+-------+ Subclavian    Full       Yes       Yes                      +----------+------------+---------+-----------+----------+-------+ Axillary      Full       Yes       Yes                      +----------+------------+---------+-----------+----------+-------+ Brachial      Full       Yes       Yes                      +----------+------------+---------+-----------+----------+-------+ Radial        Full                                          +----------+------------+---------+-----------+----------+-------+ Ulnar         Full                                          +----------+------------+---------+-----------+----------+-------+ Cephalic      Full                                          +----------+------------+---------+-----------+----------+-------+ Basilic       Full       Yes       Yes                      +----------+------------+---------+-----------+----------+-------+  Summary:  Right: No evidence of thrombosis in the subclavian.  Left: No evidence of deep vein thrombosis in the upper extremity. No evidence of superficial vein thrombosis in the upper extremity.  *See table(s) above for measurements and observations.  Diagnosing physician: Harold Barban MD Electronically signed by Harold Barban MD on 05/18/2022 at 10:32:01 PM.    Final      Assessment & Plan:   Burning with urination - POCT URINALYSIS DIP (CLINITEK)  2. Routine health maintenance  - Vitamin D, 25-hydroxy - Lipid Panel  3. Ingrown toenail of right foot  - Ambulatory referral to Podiatry   Follow up:  Follow up in 3 months or sooner if needed     [name], NP 06/01/2022

## 2022-06-02 ENCOUNTER — Other Ambulatory Visit: Payer: Self-pay

## 2022-06-02 LAB — LIPID PANEL
Chol/HDL Ratio: 5.9 ratio — ABNORMAL HIGH (ref 0.0–4.4)
Cholesterol, Total: 260 mg/dL — ABNORMAL HIGH (ref 100–199)
HDL: 44 mg/dL (ref >39)
LDL Chol Calc (NIH): 184 mg/dL — ABNORMAL HIGH (ref 0–99)
Triglycerides: 169 mg/dL — ABNORMAL HIGH (ref 0–149)
VLDL Cholesterol Cal: 32 mg/dL (ref 5–40)

## 2022-06-02 LAB — VITAMIN D 25 HYDROXY (VIT D DEFICIENCY, FRACTURES): Vit D, 25-Hydroxy: 22.9 ng/mL — ABNORMAL LOW (ref 30.0–100.0)

## 2022-06-08 MED FILL — Dexamethasone Sodium Phosphate Inj 100 MG/10ML: INTRAMUSCULAR | Qty: 1 | Status: AC

## 2022-06-09 ENCOUNTER — Encounter: Payer: Self-pay | Admitting: Hematology and Oncology

## 2022-06-09 ENCOUNTER — Inpatient Hospital Stay: Payer: No Typology Code available for payment source

## 2022-06-09 ENCOUNTER — Other Ambulatory Visit: Payer: Self-pay

## 2022-06-09 ENCOUNTER — Inpatient Hospital Stay (HOSPITAL_BASED_OUTPATIENT_CLINIC_OR_DEPARTMENT_OTHER): Payer: No Typology Code available for payment source | Admitting: Hematology and Oncology

## 2022-06-09 VITALS — BP 119/86 | HR 76 | Temp 97.0°F | Resp 16 | Ht 60.0 in | Wt 185.0 lb

## 2022-06-09 DIAGNOSIS — C50912 Malignant neoplasm of unspecified site of left female breast: Secondary | ICD-10-CM

## 2022-06-09 DIAGNOSIS — Z95828 Presence of other vascular implants and grafts: Secondary | ICD-10-CM

## 2022-06-09 DIAGNOSIS — Z170 Estrogen receptor positive status [ER+]: Secondary | ICD-10-CM

## 2022-06-09 DIAGNOSIS — C792 Secondary malignant neoplasm of skin: Secondary | ICD-10-CM

## 2022-06-09 DIAGNOSIS — C50412 Malignant neoplasm of upper-outer quadrant of left female breast: Secondary | ICD-10-CM

## 2022-06-09 DIAGNOSIS — K219 Gastro-esophageal reflux disease without esophagitis: Secondary | ICD-10-CM

## 2022-06-09 LAB — CBC WITH DIFFERENTIAL/PLATELET
Abs Immature Granulocytes: 0.01 10*3/uL (ref 0.00–0.07)
Basophils Absolute: 0 10*3/uL (ref 0.0–0.1)
Basophils Relative: 1 %
Eosinophils Absolute: 0.1 10*3/uL (ref 0.0–0.5)
Eosinophils Relative: 4 %
HCT: 35.1 % — ABNORMAL LOW (ref 36.0–46.0)
Hemoglobin: 11.7 g/dL — ABNORMAL LOW (ref 12.0–15.0)
Immature Granulocytes: 0 %
Lymphocytes Relative: 31 %
Lymphs Abs: 1 10*3/uL (ref 0.7–4.0)
MCH: 28.3 pg (ref 26.0–34.0)
MCHC: 33.3 g/dL (ref 30.0–36.0)
MCV: 85 fL (ref 80.0–100.0)
Monocytes Absolute: 0.4 10*3/uL (ref 0.1–1.0)
Monocytes Relative: 11 %
Neutro Abs: 1.7 10*3/uL (ref 1.7–7.7)
Neutrophils Relative %: 53 %
Platelets: 277 10*3/uL (ref 150–400)
RBC: 4.13 MIL/uL (ref 3.87–5.11)
RDW: 16 % — ABNORMAL HIGH (ref 11.5–15.5)
WBC: 3.2 10*3/uL — ABNORMAL LOW (ref 4.0–10.5)
nRBC: 0 % (ref 0.0–0.2)

## 2022-06-09 LAB — COMPREHENSIVE METABOLIC PANEL
ALT: 18 U/L (ref 0–44)
AST: 18 U/L (ref 15–41)
Albumin: 3.9 g/dL (ref 3.5–5.0)
Alkaline Phosphatase: 75 U/L (ref 38–126)
Anion gap: 8 (ref 5–15)
BUN: 10 mg/dL (ref 6–20)
CO2: 28 mmol/L (ref 22–32)
Calcium: 9.3 mg/dL (ref 8.9–10.3)
Chloride: 104 mmol/L (ref 98–111)
Creatinine, Ser: 0.63 mg/dL (ref 0.44–1.00)
GFR, Estimated: >60 mL/min (ref >60)
Glucose, Bld: 107 mg/dL — ABNORMAL HIGH (ref 70–99)
Potassium: 3.8 mmol/L (ref 3.5–5.1)
Sodium: 140 mmol/L (ref 135–145)
Total Bilirubin: 0.4 mg/dL (ref 0.3–1.2)
Total Protein: 6.8 g/dL (ref 6.5–8.1)

## 2022-06-09 MED ORDER — SODIUM CHLORIDE 0.9 % IV SOLN
10.0000 mg | Freq: Once | INTRAVENOUS | Status: AC
  Administered 2022-06-09: 10 mg via INTRAVENOUS
  Filled 2022-06-09: qty 10

## 2022-06-09 MED ORDER — FAM-TRASTUZUMAB DERUXTECAN-NXKI CHEMO 100 MG IV SOLR
5.4000 mg/kg | Freq: Once | INTRAVENOUS | Status: AC
  Administered 2022-06-09: 452 mg via INTRAVENOUS
  Filled 2022-06-09: qty 22.6

## 2022-06-09 MED ORDER — PALONOSETRON HCL INJECTION 0.25 MG/5ML
0.2500 mg | Freq: Once | INTRAVENOUS | Status: AC
  Administered 2022-06-09: 0.25 mg via INTRAVENOUS
  Filled 2022-06-09: qty 5

## 2022-06-09 MED ORDER — DEXTROSE 5 % IV SOLN: Freq: Once | INTRAVENOUS | Status: AC

## 2022-06-09 MED ORDER — SODIUM CHLORIDE 0.9% FLUSH
10.0000 mL | Freq: Once | INTRAVENOUS | Status: AC
  Administered 2022-06-09: 10 mL

## 2022-06-09 MED ORDER — ACETAMINOPHEN 325 MG PO TABS
650.0000 mg | ORAL_TABLET | Freq: Once | ORAL | Status: AC
  Administered 2022-06-09: 650 mg via ORAL
  Filled 2022-06-09: qty 2

## 2022-06-09 MED ORDER — OMEPRAZOLE 20 MG PO CPDR: 20.0000 mg | DELAYED_RELEASE_CAPSULE | Freq: Every day | ORAL | 0 refills | Status: DC

## 2022-06-09 MED ORDER — HEPARIN SOD (PORK) LOCK FLUSH 100 UNIT/ML IV SOLN
500.0000 [IU] | Freq: Once | INTRAVENOUS | Status: AC | PRN
  Administered 2022-06-09: 500 [IU]

## 2022-06-09 MED ORDER — SODIUM CHLORIDE 0.9% FLUSH
10.0000 mL | INTRAVENOUS | Status: DC | PRN
  Administered 2022-06-09: 10 mL

## 2022-06-09 MED ORDER — DIPHENHYDRAMINE HCL 25 MG PO CAPS
50.0000 mg | ORAL_CAPSULE | Freq: Once | ORAL | Status: AC
  Administered 2022-06-09: 50 mg via ORAL
  Filled 2022-06-09: qty 2

## 2022-06-09 NOTE — Patient Instructions (Signed)
Instrucciones al darle de alta: Discharge Instructions Gracias por elegir al [facility] de Cncer de [place] para brindarle atencin mdica de oncologa y Music therapist.   Si usted tiene una cita de laboratorio con [place], por favor vaya directamente [place] y regstrese en el rea de Control and instrumentation engineer.   Use ropa cmoda y [place] para tener fcil acceso a las vas del Portacath (acceso venoso de Engineer, site duracin) o la lnea PICC (catter central colocado por va perifrica).   Nos esforzamos por ofrecerle tiempo de calidad con su proveedor. Es posible que tenga que volver a programar su cita si llega tarde (15 minutos o ms).  El llegar tarde le afecta a usted y a otros pacientes cuyas citas son posteriores a Merchandiser, retail.  Adems, si usted falta a tres o ms citas sin avisar a la oficina, puede ser retirado(a) de la clnica a discrecin del proveedor.      Para las solicitudes de renovacin de recetas, pida a su farmacia que se ponga en contacto con nuestra oficina y deje que transcurran 58 horas para que se complete el proceso de las renovaciones.    Hoy usted recibi los siguientes agentes de quimioterapia e/o inmunoterapia: fam-trastuzumab-deruxtecan-nxki      Para ayudar a prevenir las nuseas y los vmitos despus de su tratamiento, le recomendamos que tome su medicamento para las nuseas segn las indicaciones.  LOS SNTOMAS QUE DEBEN COMUNICARSE INMEDIATAMENTE SE INDICAN A CONTINUACIN: *FIEBRE SUPERIOR A 100.4 F (38 C) O MS *ESCALOFROS O SUDORACIN *NUSEAS Y VMITOS QUE NO SE CONTROLAN CON EL MEDICAMENTO PARA LAS NUSEAS *DIFICULTAD INUSUAL PARA RESPIRAR  *MORETONES O HEMORRAGIAS NO HABITUALES *PROBLEMAS URINARIOS (dolor o ardor al Garment/textile technologist o frecuencia para Garment/textile technologist) *PROBLEMAS INTESTINALES (diarrea inusual, estreimiento, dolor cerca del ano) SENSIBILIDAD EN LA BOCA Y EN LA GARGANTA CON O SIN LA PRESENCIA DE LCERAS (dolor de garganta, llagas en la boca o dolor de  muelas/dientes) ERUPCIN, HINCHAZN O DOLORES INUSUALES FLUJO VAGINAL INUSUAL O PICAZN/RASQUIA    Los puntos marcados con un asterisco ( *) indican una posible emergencia y debe hacer un seguimiento tan pronto como le sea posible o vaya al Departamento de Emergencias si se le presenta algn problema.  Por favor, muestre la [place] DE ADVERTENCIA DE Windy Canny DE ADVERTENCIA DE Benay Spice al registrarse en [address] de Emergencias y a la enfermera de triaje.  Si tiene preguntas despus de su visita o necesita cancelar o volver a programar su cita, por favor pngase en contacto con [place]  Dept: [phone] y [place]. Las horas de oficina son de 8:00 a.m. a 4:30 p.m. de lunes a viernes. Por favor, tenga en cuenta que los mensajes de voz que se dejan despus de las 4:00 p.m. posiblemente no se devolvern hasta el siguiente da de [place].  Cerramos los fines de semana y [employer]. En todo momento tiene acceso a una enfermera para preguntas urgentes. Por favor, llame al nmero principal de la clnica Dept: [phone] y [place] May Manrique instrucciones.   Para cualquier pregunta que no sea de carcter urgente, tambin puede ponerse en contacto con su proveedor [employer] Ahora ofrecemos visitas electrnicas para cualquier persona mayor de 18 aos que solicite atencin mdica en lnea para los sntomas que no sean urgentes. Para ms detalles vaya a [email or web address].   Tambin puede bajar la aplicacin de MyChart! Vaya a la tienda de aplicaciones, busque "MyChart", abra la aplicacin, seleccione  [place], e ingrese con su nombre de usuario y la contrasea de Pharmacist, community.  Las mscaras son opcionales en los centros de Hotel manager. Si desea que su equipo de cuidados mdicos use una [employer] atienden, por favor hgaselo saber al personal. Bethann Berkshire una persona de apoyo que tenga por lo menos 16 aos  para que le acompae a sus citas.

## 2022-06-09 NOTE — Progress Notes (Signed)
[facility] Center Cancer Follow up:    Monica Andrew, NP [address] [place] [address]   DIAGNOSIS:  Cancer Staging  Malignant neoplasm of upper-outer quadrant of left breast in female, estrogen receptor positive (HCC) Staging form: Breast, AJCC 8th Edition - Clinical stage from 01/29/2020: Stage IIIA (cT2, cN1, cM0, G3, ER+, PR-, HER2-) - Unsigned Stage prefix: Initial diagnosis Histologic grading system: 3 grade system - Pathologic stage from 07/15/2020: No Stage Recommended (ypT1b, pN0, cM0, G3, ER-, PR-, HER2-) - Signed by Loa Socks, NP on 04/01/2022 Stage prefix: Post-therapy Histologic grading system: 3 grade system   SUMMARY OF ONCOLOGIC HISTORY: Oncology History  Malignant neoplasm of upper-outer quadrant of left breast in female, estrogen receptor positive (HCC)  01/23/2020 Initial Diagnosis   [place] woman status post left breast upper outer quadrant biopsy 01/23/2020 for a clinical T3 N1, 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             (a) chest CT scan and bone scan 02/07/2020 showed no evidence of metastatic disease   02/07/2020 Genetic Testing   Negative genetic testing. No pathogenic variants identified on the [facility] Common Hereditary Cancers Panel. VUS in MSH6 called c.2744C>G identified. The report date is 02/07/2020.  The Common Hereditary Cancers Panel offered by [facility] includes sequencing and/or deletion duplication testing of the following 48 genes: APC, ATM, AXIN2, BARD1, BMPR1A, BRCA1, BRCA2, BRIP1, CDH1, CDKN2A (p14ARF), CDKN2A (p16INK4a), CKD4, CHEK2, CTNNA1, DICER1, EPCAM (Deletion/duplication testing only), GREM1 (promoter region deletion/duplication testing only), KIT, MEN1, MLH1, MSH2, MSH3, MSH6, MUTYH, NBN, NF1, NHTL1, PALB2, PDGFRA, PMS2, POLD1, POLE, PTEN, RAD50, RAD51C, RAD51D, RNF43, SDHB, SDHC, SDHD, SMAD4, SMARCA4. STK11, TP53, TSC1, TSC2, and VHL.   The following genes were evaluated for sequence changes only: SDHA and HOXB13 c.251G>A variant only.   02/12/2020 - 05/28/2020 Neo-Adjuvant Chemotherapy   neoadjuvant chemotherapy consisting of doxorubicin and cyclophosphamide in dose dense fashion x4 started 02/12/2020, completed 03/25/2020, followed by paclitaxel and carboplatin weekly x12 started 04/08/2020 and completed on 05/28/2020             (a) echo 02/07/2020 shows an ejection fraction in the 55-60% range             (b) Epirubicin substituted for Doxorubicin starting with cycle 2 of AC secondary to allergic rash from Doxorubicin   07/15/2020 Surgery   status post left lumpectomy and sentinel lymph node sampling 07/15/2020 for a ypT1b ypN0 residual invasive ductal carcinoma, grade 3, with negative margins.             (a) a total of 4 left axillary lymph nodes were removed             (b) repeat prognostic panel on the final pathology again triple negative, with an MIB-1 of 50%.   07/15/2020 Cancer Staging   Staging form: Breast, AJCC 8th Edition - Pathologic stage from 07/15/2020: No Stage Recommended (ypT1b, pN0, cM0, G3, ER-, PR-, HER2-) - Signed by Loa Socks, NP on 04/01/2022 Stage prefix: Post-therapy Histologic grading system: 3 grade system   08/26/2020 - 10/09/2020 Radiation Therapy   adjuvant radiation completed 10/09/2020             (a) sensitizing capecitabine attempted but not tolerated by the patient  08/26/2020 through 10/09/2020 Site Technique Total Dose (Gy) Dose per Fx (Gy) Completed Fx Beam Energies  Breast, Left: Breast_Lt 3D 50/50 2 25/25 15X  Breast, Left: Breast_Lt_PAB_SCV 3D 50/50 2 25/25 10X, 15X  Breast, Left: Breast_Lt_Bst 3D 10/10 2 5/5 6X, 10X    11/23/2020 - 01/04/2021 Adjuvant Chemotherapy   adjuvant pembrolizumab started 11/23/2020, to continue for 1 year             (a) discontinued after 01/04/2021 dose with multiple side effects requiring steroids for resolution   03/30/2022 Mammogram    Mammogram and ultrasound show numerous masses throughout left breast not visualized in 01/2022, +left axillary lymphadenopathy, skin thickening in right breast and in upper abdomen just below sternum   04/05/2022 -  Chemotherapy   Patient is on Treatment Plan : BREAST METASTATIC Fam-Trastuzumab Deruxtecan-nxki (Enhertu) (5.4) q21d     04/06/2022 - 04/26/2022 Chemotherapy   Patient is on Treatment Plan : BREAST METASTATIC fam-trastuzumab deruxtecan-nxki (Enhertu) q21d     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  Prognostic panel sent to [facility]: Left breast skin, punch biopsy: Skin with dermal lymphatic involvement by carcinoma (confirmed with outside CK7 IHC, reviewed). Biomarkers are reported as negative for ER, PR, Her2/neu (confirmed on review but please note sample size is extremely small).   02/15/2022 - 03/08/2022 Chemotherapy    Taxotere/Cytoxan x 2, discontinued due to progression of breast.   03/30/2022 Mammogram   Mammogram and ultrasound show numerous masses throughout left breast not visualized in 01/2022, +left axillary lymphadenopathy, skin thickening in right breast and in upper abdomen just below sternum   04/05/2022 -  Chemotherapy   Patient is on Treatment Plan : BREAST METASTATIC Fam-Trastuzumab Deruxtecan-nxki (Enhertu) (5.4) q21d     04/06/2022 - 04/26/2022 Chemotherapy   Patient is on Treatment Plan : BREAST METASTATIC fam-trastuzumab deruxtecan-nxki (Enhertu) q21d      CURRENT THERAPY: progression on Taxotere and cytoxan  INTERVAL HISTORY:  [name] 46 y.o. female returns for follow-up of her breast cancer.   [name] is here with a certified Spanish interpreter.  Since last visit [name] continues to do well.  [name] did not notice much change in her breast.  [name] denies any adverse effects with Enhertu except for some fatigue, nausea and heartburn.  [name] has been taking  leftover omeprazole as needed.  [name] is understandably stressed out about having to go through surgery but [name] is open to pursuing surgery after couple rounds of chemotherapy.  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) 04/01/2022   Encounter for dental examination 03/11/2022   Acute apical periodontitis 03/11/2022   Phobia of dental procedure 03/11/2022   Defective dental restoration 03/11/2022   Accretions on teeth 03/11/2022   Chronic periodontitis 03/11/2022   Teeth missing 03/11/2022   Caries 03/11/2022   Generalized gingival recession 03/11/2022   Pain, dental 03/08/2022   Cancer-related pain 03/08/2022   Port-A-Cath in place 02/15/2022   Oral allergy syndrome, subsequent encounter 02/03/2022   Adverse effect of other drugs, medicaments and biological substances, subsequent encounter 02/02/2022   Other adverse food reactions, not elsewhere classified, subsequent encounter 02/02/2022   Chest tightness 02/02/2022   Other allergic rhinitis 02/02/2022   Allergic conjunctivitis of both eyes 02/02/2022   Language barrier 12/11/2020   Morbid obesity (HCC)  History of breast cancer    Genetic testing 02/13/2020   Malignant neoplasm of upper-outer quadrant of left breast in female, estrogen receptor positive ([place]) 01/27/2020   Impaired ability to use community resources due to language barrier 07/13/2013    is allergic to omnipaque [iohexol], doxorubicin hcl, keytruda [pembrolizumab], other, and tape.  MEDICAL HISTORY: Past Medical History:  Diagnosis Date   Allergy    Asthma    Breast cancer in female [facility])    Left   Breast disorder    breast cancer May 2021   Chronic back pain    History of breast cancer    History of COVID-19 04/20/2021    Hx of migraines    Morbid obesity ([place])    Personal history of chemotherapy    Personal history of radiation therapy     SURGICAL HISTORY: Past Surgical History:  Procedure Laterality Date   BREAST LUMPECTOMY     BREAST LUMPECTOMY WITH RADIOACTIVE SEED AND SENTINEL LYMPH NODE BIOPSY Left 07/15/2020   Procedure: LEFT BREAST LUMPECTOMY WITH RADIOACTIVE SEED AND LEFT AXILLARY SENTINEL LYMPH NODE BIOPSY, LEFT AXILLARY NODE RADIOACTIVE SEED GUIDED EXCISION, LEFT BREAST RADIOACTIVE SEED GUIDED EXCISION IM NODE;  Surgeon: Rolm Bookbinder, MD;  Location: [place];  Service: General;  Laterality: Left;  PEC BLOCK   CESAREAN SECTION WITH BILATERAL TUBAL LIGATION Bilateral 07/23/2015   Procedure: CESAREAN SECTION WITH BILATERAL TUBAL LIGATION;  Surgeon: Donnamae Jude, MD;  Location: [facility];  Service: Obstetrics;  Laterality: Bilateral;   IR IMAGING GUIDED PORT INSERTION  02/11/2022   PORT-A-CATH REMOVAL N/A 04/30/2021   Procedure: REMOVAL PORT-A-CATH;  Surgeon: Rolm Bookbinder, MD;  Location: [place];  Service: General;  Laterality: N/A;   PORTACATH PLACEMENT Right 02/11/2020   Procedure: INSERTION PORT-A-CATH WITH ULTRASOUND GUIDANCE;  Surgeon: Rolm Bookbinder, MD;  Location: [place];  Service: General;  Laterality: Right;   TUBAL LIGATION      SOCIAL HISTORY: Social History   Socioeconomic History   Marital status: Legally Separated    Spouse name: Not on file   Number of children: 3   Years of education: Not on file   Highest education level: 9th grade  Occupational History   Occupation: Architect  Tobacco Use   Smoking status: Never   Smokeless tobacco: Never  Vaping Use   Vaping Use: Never used  Substance and Sexual Activity   Alcohol use: No   Drug use: Never   Sexual activity: Yes    Birth control/protection: Surgical  Other Topics Concern   Not on file  Social History Narrative   Not on file   Social Determinants of Health    Financial Resource Strain: Not on file  Food Insecurity: No Food Insecurity (03/29/2022)   Hunger Vital Sign    Worried About Running Out of Food in the Last Year: Never true    Ran Out of Food in the Last Year: Never true  Transportation Needs: No Transportation Needs (03/29/2022)   PRAPARE - Hydrologist (Medical): No    Lack of Transportation (Non-Medical): No  Physical Activity: Not on file  Stress: Not on file  Social Connections: Not on file  Intimate Partner Violence: Not on file    FAMILY HISTORY: Family History  Problem Relation Age of Onset   Heart disease Maternal Grandmother     Review of Systems  Constitutional:  Positive for fatigue. Negative for appetite change, chills, fever and unexpected weight change.  HENT:   Negative for hearing loss, lump/mass and trouble swallowing.   Eyes:  Negative for eye problems and icterus.  Respiratory:  Negative for chest tightness, cough and shortness of breath.   Cardiovascular:  Negative for chest pain, leg swelling and palpitations.  Gastrointestinal:  Negative for abdominal distention, abdominal pain, constipation, diarrhea, nausea and vomiting.  Endocrine: Negative for hot flashes.  Genitourinary:  Negative for difficulty urinating.   Musculoskeletal:  Negative for arthralgias.  Skin:  Positive for rash. Negative for itching.  Neurological:  Negative for dizziness, extremity weakness, headaches and numbness.  Hematological:  Negative for adenopathy. Does not bruise/bleed easily.  Psychiatric/Behavioral:  Negative for depression. The patient is not nervous/anxious.       PHYSICAL EXAMINATION  ECOG PERFORMANCE STATUS: 1 - Symptomatic but completely ambulatory  Vitals:   06/09/22 0840  BP: 119/86  Pulse: 76  Resp: 16  Temp: (!) 97 F (36.1 C)  SpO2: 100%   Physical Exam Constitutional:      Appearance: Normal appearance.  Pulmonary:     Effort: Pulmonary effort is normal.      Breath sounds: Normal breath sounds.  Chest:     Comments: Compared to last visit, left breast appears to be about stable, with some improvement in texture in the inner portion of the left breast.  No new masses.  No new lymphadenopathy.  The area of redness remains the same in the posterior chest wall, makes me think this is unrelated to the cancer. Abdominal:     General: Abdomen is flat.     Palpations: Abdomen is soft.  Musculoskeletal:        General: No swelling. Normal range of motion.     Cervical back: Normal range of motion and neck supple. No rigidity.  Lymphadenopathy:     Cervical: No cervical adenopathy.  Neurological:     Mental Status: [name] is alert.      LABORATORY DATA:  None today  RADIOGRAPHIC STUDIES:  No results found.    ASSESSMENT and THERAPY PLAN:   Malignant neoplasm of upper-outer quadrant of left breast in female, estrogen receptor positive ([place]) [name] had good response so far with improving erythema, induration of the left breast, resolution of the redness in the midline as well as the inner portion of the right breast.[name] is a 46 year old Spanish-speaking woman who has a 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  [name] was most recently seen for ongoing breast redness, given a course of antibiotics but had persistent redness hence we proceeded with a punch biopsy.  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  Pathology was sent to [facility] for second opinion as well. This confirmed triple negative breast cancer.  [name] progressed on after 2 cycles of TC hence we have switched her treatment to Enhertu.   Repeat biopsy demonstrated her HER2 to be 1+. After her first infusion, [name] came back to the clinic with increasing redness history of treated empirically for cellulitis, gave her a dose of IV vancomycin, oral doxycycline [name] is now here before  planned cycle 4 of Enhertu.  [name] has had robust response so far with decreased erythema, induration and edema of the left breast, medial portion of the right breast as well as midline. [name] would like to celebrate her daughter's 15th birthday in mid November.  [name] was hoping to continue at least 2-3 more cycles and then pursue surgery.  I think this is reasonable as long as [name] continues to respond.  CBC reviewed and satisfactory to proceed with treatment Last echo in July, we will repeat echocardiogram in October Return to clinic before next planned cycle of chemotherapy  GERD, recommend omeprazole daily, prescription dispensed to the pharmacy of her choice Nausea associated with Enhertu, recommend dexamethasone and as needed nausea medication  All questions were answered. The patient knows to call the clinic with any problems, questions or concerns. We can certainly see the patient much sooner if necessary.  Total time spent: 30 minutes.  Certified Spanish interpreter was used for the entirety of this conversation  *Total Encounter Time as defined by the Centers for Medicare and Medicaid Services includes, in addition to the face-to-face time of a patient visit (documented in the note above) non-face-to-face time: obtaining and reviewing outside history, ordering and reviewing medications, tests or procedures, care coordination (communications with other health care professionals or caregivers) and documentation in the medical record.

## 2022-06-09 NOTE — Assessment & Plan Note (Signed)
She had good response so far with improving erythema, induration of the left breast, resolution of the redness in the midline as well as the inner portion of the right breast.[name] is a 46 year old Spanish-speaking woman who has a 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  She was most recently seen for ongoing breast redness, given a course of antibiotics but had persistent redness hence we proceeded with a punch biopsy.  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  Pathology was sent to [facility] for second opinion as well. This confirmed triple negative breast cancer.  She progressed on after 2 cycles of TC hence we have switched her treatment to Enhertu.   Repeat biopsy demonstrated her HER2 to be 1+. After her first infusion, she came back to the clinic with increasing redness history of treated empirically for cellulitis, gave her a dose of IV vancomycin, oral doxycycline She is now here before planned cycle 4 of Enhertu.  She has had robust response so far with decreased erythema, induration and edema of the left breast, medial portion of the right breast as well as midline. She would like to celebrate her daughter's 15th birthday in mid November.  She was hoping to continue at least 2-3 more cycles and then pursue surgery.  I think this is reasonable as long as she continues to respond.  CBC reviewed and satisfactory to proceed with treatment Last echo in July, we will repeat echocardiogram in October Return to clinic before next planned cycle of chemotherapy

## 2022-06-10 ENCOUNTER — Ambulatory Visit: Payer: No Typology Code available for payment source | Admitting: Podiatry

## 2022-06-27 ENCOUNTER — Ambulatory Visit (HOSPITAL_COMMUNITY)
Admission: RE | Admit: 2022-06-27 | Discharge: 2022-06-27 | Disposition: A | Discharge status: Home / Self Care | Payer: Self-pay | Source: Ambulatory Visit | Attending: Hematology and Oncology | Admitting: Hematology and Oncology

## 2022-06-27 DIAGNOSIS — C50912 Malignant neoplasm of unspecified site of left female breast: Secondary | ICD-10-CM

## 2022-06-27 DIAGNOSIS — Z5181 Encounter for therapeutic drug level monitoring: Secondary | ICD-10-CM | POA: Insufficient documentation

## 2022-06-27 DIAGNOSIS — Z7960 Long term (current) use of unspecified immunomodulators and immunosuppressants: Secondary | ICD-10-CM | POA: Insufficient documentation

## 2022-06-27 DIAGNOSIS — C792 Secondary malignant neoplasm of skin: Secondary | ICD-10-CM

## 2022-06-27 DIAGNOSIS — C50412 Malignant neoplasm of upper-outer quadrant of left female breast: Secondary | ICD-10-CM

## 2022-06-27 DIAGNOSIS — Z0189 Encounter for other specified special examinations: Secondary | ICD-10-CM

## 2022-06-27 DIAGNOSIS — Z170 Estrogen receptor positive status [ER+]: Secondary | ICD-10-CM

## 2022-06-27 LAB — ECHOCARDIOGRAM COMPLETE
Area-P 1/2: 3.31 cm2
S' Lateral: 3 cm

## 2022-06-27 MED FILL — Dexamethasone Sodium Phosphate Inj 100 MG/10ML: INTRAMUSCULAR | Qty: 1 | Status: AC

## 2022-06-27 NOTE — Progress Notes (Signed)
  Echocardiogram 2D Echocardiogram has been performed.  [name] M 06/27/2022, 9:50 AM

## 2022-06-28 ENCOUNTER — Inpatient Hospital Stay: Payer: No Typology Code available for payment source

## 2022-06-28 ENCOUNTER — Inpatient Hospital Stay (HOSPITAL_BASED_OUTPATIENT_CLINIC_OR_DEPARTMENT_OTHER): Payer: No Typology Code available for payment source | Admitting: Hematology and Oncology

## 2022-06-28 ENCOUNTER — Inpatient Hospital Stay: Payer: No Typology Code available for payment source | Attending: Hematology and Oncology

## 2022-06-28 ENCOUNTER — Other Ambulatory Visit: Payer: Self-pay

## 2022-06-28 ENCOUNTER — Encounter: Payer: Self-pay | Admitting: Hematology and Oncology

## 2022-06-28 ENCOUNTER — Telehealth: Payer: Self-pay | Admitting: *Deleted

## 2022-06-28 VITALS — BP 101/79 | HR 71 | Resp 16

## 2022-06-28 DIAGNOSIS — Z95828 Presence of other vascular implants and grafts: Secondary | ICD-10-CM

## 2022-06-28 DIAGNOSIS — C792 Secondary malignant neoplasm of skin: Secondary | ICD-10-CM

## 2022-06-28 DIAGNOSIS — C50912 Malignant neoplasm of unspecified site of left female breast: Secondary | ICD-10-CM

## 2022-06-28 DIAGNOSIS — Z170 Estrogen receptor positive status [ER+]: Secondary | ICD-10-CM

## 2022-06-28 DIAGNOSIS — C50412 Malignant neoplasm of upper-outer quadrant of left female breast: Secondary | ICD-10-CM | POA: Insufficient documentation

## 2022-06-28 DIAGNOSIS — Z5112 Encounter for antineoplastic immunotherapy: Secondary | ICD-10-CM | POA: Insufficient documentation

## 2022-06-28 LAB — CMP (CANCER CENTER ONLY)
ALT: 17 U/L (ref 0–44)
AST: 20 U/L (ref 15–41)
Albumin: 3.9 g/dL (ref 3.5–5.0)
Alkaline Phosphatase: 85 U/L (ref 38–126)
Anion gap: 4 — ABNORMAL LOW (ref 5–15)
BUN: 16 mg/dL (ref 6–20)
CO2: 28 mmol/L (ref 22–32)
Calcium: 9.4 mg/dL (ref 8.9–10.3)
Chloride: 107 mmol/L (ref 98–111)
Creatinine: 0.75 mg/dL (ref 0.44–1.00)
GFR, Estimated: >60 mL/min (ref >60)
Glucose, Bld: 111 mg/dL — ABNORMAL HIGH (ref 70–99)
Potassium: 4 mmol/L (ref 3.5–5.1)
Sodium: 139 mmol/L (ref 135–145)
Total Bilirubin: 0.3 mg/dL (ref 0.3–1.2)
Total Protein: 6.7 g/dL (ref 6.5–8.1)

## 2022-06-28 LAB — CBC WITH DIFFERENTIAL (CANCER CENTER ONLY)
Abs Immature Granulocytes: 0.02 10*3/uL (ref 0.00–0.07)
Basophils Absolute: 0 10*3/uL (ref 0.0–0.1)
Basophils Relative: 1 %
Eosinophils Absolute: 0.1 10*3/uL (ref 0.0–0.5)
Eosinophils Relative: 4 %
HCT: 35.2 % — ABNORMAL LOW (ref 36.0–46.0)
Hemoglobin: 11.4 g/dL — ABNORMAL LOW (ref 12.0–15.0)
Immature Granulocytes: 1 %
Lymphocytes Relative: 40 %
Lymphs Abs: 1.1 10*3/uL (ref 0.7–4.0)
MCH: 28 pg (ref 26.0–34.0)
MCHC: 32.4 g/dL (ref 30.0–36.0)
MCV: 86.5 fL (ref 80.0–100.0)
Monocytes Absolute: 0.3 10*3/uL (ref 0.1–1.0)
Monocytes Relative: 10 %
Neutro Abs: 1.3 10*3/uL — ABNORMAL LOW (ref 1.7–7.7)
Neutrophils Relative %: 44 %
Platelet Count: 222 10*3/uL (ref 150–400)
RBC: 4.07 MIL/uL (ref 3.87–5.11)
RDW: 15.7 % — ABNORMAL HIGH (ref 11.5–15.5)
WBC Count: 2.8 10*3/uL — ABNORMAL LOW (ref 4.0–10.5)
nRBC: 0 % (ref 0.0–0.2)

## 2022-06-28 MED ORDER — PALONOSETRON HCL INJECTION 0.25 MG/5ML
0.2500 mg | Freq: Once | INTRAVENOUS | Status: AC
  Administered 2022-06-28: 0.25 mg via INTRAVENOUS
  Filled 2022-06-28: qty 5

## 2022-06-28 MED ORDER — SODIUM CHLORIDE 0.9% FLUSH
10.0000 mL | Freq: Once | INTRAVENOUS | Status: AC
  Administered 2022-06-28: 10 mL

## 2022-06-28 MED ORDER — DEXTROSE 5 % IV SOLN: Freq: Once | INTRAVENOUS | Status: AC

## 2022-06-28 MED ORDER — SODIUM CHLORIDE 0.9 % IV SOLN
10.0000 mg | Freq: Once | INTRAVENOUS | Status: AC
  Administered 2022-06-28: 10 mg via INTRAVENOUS
  Filled 2022-06-28: qty 10

## 2022-06-28 MED ORDER — ACETAMINOPHEN 325 MG PO TABS
650.0000 mg | ORAL_TABLET | Freq: Once | ORAL | Status: AC
  Administered 2022-06-28: 650 mg via ORAL
  Filled 2022-06-28: qty 2

## 2022-06-28 MED ORDER — FAM-TRASTUZUMAB DERUXTECAN-NXKI CHEMO 100 MG IV SOLR
5.4000 mg/kg | Freq: Once | INTRAVENOUS | Status: AC
  Administered 2022-06-28: 452 mg via INTRAVENOUS
  Filled 2022-06-28: qty 22.6

## 2022-06-28 MED ORDER — HEPARIN SOD (PORK) LOCK FLUSH 100 UNIT/ML IV SOLN
500.0000 [IU] | Freq: Once | INTRAVENOUS | Status: AC | PRN
  Administered 2022-06-28: 500 [IU]

## 2022-06-28 MED ORDER — DIPHENHYDRAMINE HCL 25 MG PO CAPS
50.0000 mg | ORAL_CAPSULE | Freq: Once | ORAL | Status: AC
  Administered 2022-06-28: 50 mg via ORAL
  Filled 2022-06-28: qty 2

## 2022-06-28 MED ORDER — SODIUM CHLORIDE 0.9% FLUSH
10.0000 mL | INTRAVENOUS | Status: DC | PRN
  Administered 2022-06-28: 10 mL

## 2022-06-28 NOTE — Assessment & Plan Note (Signed)
She had good response so far with improving erythema, induration of the left breast, resolution of the redness in the midline as well as the inner portion of the right breast.[name] is a 46 year old Spanish-speaking woman who has a 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  She was most recently seen for ongoing breast redness, given a course of antibiotics but had persistent redness hence we proceeded with a punch biopsy.  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  Pathology was sent to [facility] for second opinion as well. This confirmed triple negative breast cancer.  She progressed on after 2 cycles of TC hence we have switched her treatment to Enhertu.   Repeat biopsy demonstrated her HER2 to be 1+. After her first infusion, she came back to the clinic with increasing redness history of treated empirically for cellulitis, gave her a dose of IV vancomycin, oral doxycycline She is now here before planned cycle 6 of Enhertu.  She has had robust response so far with decreased erythema, induration and edema of the left breast, medial portion of the right breast as well as midline. She continues to tolerate enhertu well over all. I dont think the erythema on her back is necessarily related since this fluctuates with no pattern. Overall I am still pleased with the response to Enhertu At this time she is scheduled for one more cycle. I sent an inbasket message to Dr Donne Hazel, hopefully she can be scheduled for surgery in first couple weeks of December. We will review final path and decide on adjuvant treatment recommendations. She is agreeable to this plan With regards to back pain, we will monitor this, she had no metastatic disease in the past. The pattern of back pain is likely musculoskeletal No concern for pneumonitis. ECHO from yesterday stable RTC in 3 weeks or sooner as needed Continue  PPI for GERD, decrease dex dose by half and monitor.

## 2022-06-28 NOTE — Patient Instructions (Signed)
Instrucciones al darle de alta: Discharge Instructions Gracias por elegir al [facility] [place] para brindarle atencin mdica de oncologa y hematologa.   Si usted tiene una cita de laboratorio con el [facility], por favor vaya directamente al [facility] y regstrese en el rea de registro.   Use ropa cmoda y adecuada para tener fcil acceso a las vas del Portacath (acceso venoso de larga duracin) o la lnea PICC (catter central colocado por va perifrica).   Nos esforzamos por ofrecerle tiempo de calidad con su proveedor. Es posible que tenga que volver a programar su cita si llega tarde (15 minutos o ms).  El llegar tarde le afecta a usted y a otros pacientes cuyas citas son posteriores a la suya.  Adems, si usted falta a tres o ms citas sin avisar a la oficina, puede ser retirado(a) de la clnica a discrecin del proveedor.      Para las solicitudes de renovacin de recetas, pida a su farmacia que se ponga en contacto con nuestra oficina y deje que transcurran 72 horas para que se complete el proceso de las renovaciones.    Hoy usted recibi los siguientes agentes de quimioterapia e/o inmunoterapia: Enhertu     Para ayudar a prevenir las nuseas y los vmitos despus de su tratamiento, le recomendamos que tome su medicamento para las nuseas segn las indicaciones.  LOS SNTOMAS QUE DEBEN COMUNICARSE INMEDIATAMENTE SE INDICAN A CONTINUACIN: *FIEBRE SUPERIOR A 100.4 F (38 C) O MS *ESCALOFROS O SUDORACIN *NUSEAS Y VMITOS QUE NO SE CONTROLAN CON EL MEDICAMENTO PARA LAS NUSEAS *DIFICULTAD INUSUAL PARA RESPIRAR  *MORETONES O HEMORRAGIAS NO HABITUALES *PROBLEMAS URINARIOS (dolor o ardor al orinar o frecuencia para orinar) *PROBLEMAS INTESTINALES (diarrea inusual, estreimiento, dolor cerca del ano) SENSIBILIDAD EN LA BOCA Y EN LA GARGANTA CON O SIN LA PRESENCIA DE LCERAS (dolor de garganta, llagas en la boca o dolor de muelas/dientes) ERUPCIN,  HINCHAZN O DOLORES INUSUALES FLUJO VAGINAL INUSUAL O PICAZN/RASQUIA    Los puntos marcados con un asterisco ( *) indican una posible emergencia y debe hacer un seguimiento tan pronto como le sea posible o vaya al Departamento de Emergencias si se le presenta algn problema.  Por favor, muestre la TARJETA DE ADVERTENCIA DE QUIMIOTERAPIA O LA TARJETA DE ADVERTENCIA DE INMUNOTERAPIA al registrarse en el Departamento de Emergencias y a la enfermera de triaje.  Si tiene preguntas despus de su visita o necesita cancelar o volver a programar su cita, por favor pngase en contacto con [facility]  Dept: [phone] y siga las instrucciones. Las horas de oficina son de 8:00 a.m. a 4:30 p.m. de lunes a viernes. Por favor, tenga en cuenta que los mensajes de voz que se dejan despus de las 4:00 p.m. posiblemente no se devolvern hasta el siguiente da de trabajo.  Cerramos los fines de semana y los das festivos importantes. En todo momento tiene acceso a una enfermera para preguntas urgentes. Por favor, llame al nmero principal de la clnica Dept: [phone] y siga las instrucciones.   Para cualquier pregunta que no sea de carcter urgente, tambin puede ponerse en contacto con su proveedor utilizando MyChart. Ahora ofrecemos visitas electrnicas para cualquier persona mayor de 18 aos que solicite atencin mdica en lnea para los sntomas que no sean urgentes. Para ms detalles vaya a [email or web address].   Tambin puede bajar la aplicacin de MyChart! Vaya a la tienda de aplicaciones, busque "MyChart", abra la aplicacin, seleccione [facility], e ingrese con su nombre de usuario y la contrasea de MyChart.  Las mscaras son opcionales en los centros de cncer. Si desea que su equipo de cuidados mdicos use una mscara mientras le atienden, por favor hgaselo saber al personal. Puede tener una persona de apoyo que tenga por lo menos 16 aos para que le acompae a sus  citas. 

## 2022-06-28 NOTE — Telephone Encounter (Signed)
Ok to treat with ANC of 1.3 per MD- informed treatment room nurse.

## 2022-06-28 NOTE — Progress Notes (Signed)
[facility] Follow up:    Monica Foy, NP [address], [address] [place] [address]   DIAGNOSIS:  Cancer Staging  Malignant neoplasm of upper-outer quadrant of left breast in female, estrogen receptor positive ([place]) Staging form: Breast, AJCC 8th Edition - Clinical stage from 01/29/2020: Stage IIIA (cT2, cN1, cM0, G3, ER+, PR-, HER2-) - Signed by Benay Pike, MD on 06/28/2022 Stage prefix: Initial diagnosis Histologic grading system: 3 grade system - Pathologic stage from 07/15/2020: No Stage Recommended (ypT1b, pN0, cM0, G3, ER-, PR-, HER2-) - Signed by Gardenia Phlegm, NP on 04/01/2022 Stage prefix: Post-therapy Histologic grading system: 3 grade system   SUMMARY OF ONCOLOGIC HISTORY: Oncology History  Malignant neoplasm of upper-outer quadrant of left breast in female, estrogen receptor positive ([place])  01/23/2020 Initial Diagnosis   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             (a) chest CT scan and bone scan 02/07/2020 showed no evidence of metastatic disease   01/29/2020 Cancer Staging   Staging form: Breast, AJCC 8th Edition - Clinical stage from 01/29/2020: Stage IIIA (cT2, cN1, cM0, G3, ER+, PR-, HER2-) - Signed by Benay Pike, MD on 06/28/2022 Stage prefix: Initial diagnosis Histologic grading system: 3 grade system   02/07/2020 Genetic Testing   Negative genetic testing. No pathogenic variants identified on the [facility] Common Hereditary Cancers Panel. VUS in MSH6 called c.2744C>G identified. The report date is 02/07/2020.  The Common Hereditary Cancers Panel offered by [facility] includes sequencing and/or deletion duplication testing of the following 48 genes: APC, ATM, AXIN2, BARD1, BMPR1A, BRCA1, BRCA2, BRIP1, CDH1, CDKN2A (p14ARF), CDKN2A (p16INK4a), CKD4, CHEK2,  CTNNA1, DICER1, EPCAM (Deletion/duplication testing only), GREM1 (promoter region deletion/duplication testing only), KIT, MEN1, MLH1, MSH2, MSH3, MSH6, MUTYH, NBN, NF1, NHTL1, PALB2, PDGFRA, PMS2, POLD1, POLE, PTEN, RAD50, RAD51C, RAD51D, RNF43, SDHB, SDHC, SDHD, SMAD4, SMARCA4. STK11, TP53, TSC1, TSC2, and VHL.  The following genes were evaluated for sequence changes only: SDHA and HOXB13 c.251G>A variant only.   02/12/2020 - 05/28/2020 Neo-Adjuvant Chemotherapy   neoadjuvant chemotherapy consisting of doxorubicin and cyclophosphamide in dose dense fashion x4 started 02/12/2020, completed 03/25/2020, followed by paclitaxel and carboplatin weekly x12 started 04/08/2020 and completed on 05/28/2020             (a) echo 02/07/2020 shows an ejection fraction in the 55-60% range             (b) Epirubicin substituted for Doxorubicin starting with cycle 2 of AC secondary to allergic rash from Doxorubicin   07/15/2020 Surgery   status post left lumpectomy and sentinel lymph node sampling 07/15/2020 for a ypT1b ypN0 residual invasive ductal carcinoma, grade 3, with negative margins.             (a) a total of 4 left axillary lymph nodes were removed             (b) repeat prognostic panel on the final pathology again triple negative, with an MIB-1 of 50%.   07/15/2020 Cancer Staging   Staging form: Breast, AJCC 8th Edition - Pathologic stage from 07/15/2020: No Stage Recommended (ypT1b, pN0, cM0, G3, ER-, PR-, HER2-) - Signed by Gardenia Phlegm, NP on 04/01/2022 Stage prefix: Post-therapy Histologic grading system: 3 grade system   08/26/2020 - 10/09/2020 Radiation Therapy   adjuvant radiation  completed 10/09/2020             (a) sensitizing capecitabine attempted but not tolerated by the patient  08/26/2020 through 10/09/2020 Site Technique Total Dose (Gy) Dose per Fx (Gy) Completed Fx Beam Energies  Breast, Left: Breast_Lt 3D 50/50 2 25/25 15X  Breast, Left: Breast_Lt_PAB_SCV 3D 50/50 2 25/25 10X,  15X  Breast, Left: Breast_Lt_Bst 3D 10/10 2 5/5 6X, 10X    11/23/2020 - 01/04/2021 Adjuvant Chemotherapy   adjuvant pembrolizumab started 11/23/2020, to continue for 1 year             (a) discontinued after 01/04/2021 dose with multiple side effects requiring steroids for resolution   03/30/2022 Mammogram   Mammogram and ultrasound show numerous masses throughout left breast not visualized in 01/2022, +left axillary lymphadenopathy, skin thickening in right breast and in upper abdomen just below sternum   04/05/2022 -  Chemotherapy   Patient is on Treatment Plan : BREAST METASTATIC Fam-Trastuzumab Deruxtecan-nxki (Enhertu) (5.4) q21d     04/06/2022 - 04/26/2022 Chemotherapy   Patient is on Treatment Plan : BREAST METASTATIC fam-trastuzumab deruxtecan-nxki (Enhertu) q21d     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  Prognostic panel sent to [facility]: Left breast skin, punch biopsy: Skin with dermal lymphatic involvement by carcinoma (confirmed with outside CK7 IHC, reviewed). Biomarkers are reported as negative for ER, PR, Her2/neu (confirmed on review but please note sample size is extremely small).   02/15/2022 - 03/08/2022 Chemotherapy    Taxotere/Cytoxan x 2, discontinued due to progression of breast.   03/30/2022 Mammogram   Mammogram and ultrasound show numerous masses throughout left breast not visualized in 01/2022, +left axillary lymphadenopathy, skin thickening in right breast and in upper abdomen just below sternum   04/05/2022 -  Chemotherapy   Patient is on Treatment Plan : BREAST METASTATIC Fam-Trastuzumab Deruxtecan-nxki (Enhertu) (5.4) q21d     04/06/2022 - 04/26/2022 Chemotherapy   Patient is on Treatment Plan : BREAST METASTATIC fam-trastuzumab deruxtecan-nxki (Enhertu) q21d      CURRENT THERAPY: progression on Taxotere and cytoxan  INTERVAL HISTORY:  [name] 46 y.o.  female returns for follow-up of her breast cancer.   She is here with a certified Spanish interpreter.  Since last visit Ms. Jyoti continues to do well. She reports ongoing reflux and has been taking omeprazole. She reports some back pain on the left side, but more in the upper thorax. No cough, shortness of breath except rare cough in the morning and night. Bilateral lower back pain in the last 1/2 weeks, not significantly limiting her in any way. No nausea, vomiting, diarrhea. No change in urinary habits. No blood in urine. 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[place]) 04/01/2022   Encounter for dental examination 03/11/2022   Acute apical periodontitis 03/11/2022   Phobia of dental procedure 03/11/2022   Defective dental restoration 03/11/2022   Accretions on teeth 03/11/2022   Chronic periodontitis 03/11/2022   Teeth missing 03/11/2022   Caries 03/11/2022   Generalized gingival recession 03/11/2022   Pain, dental 03/08/2022   Cancer-related pain 03/08/2022   Port-A-Cath in place 02/15/2022   Oral allergy syndrome, subsequent encounter 02/03/2022   Adverse effect of other drugs, medicaments  and biological substances, subsequent encounter 02/02/2022   Other adverse food reactions, not elsewhere classified, subsequent encounter 02/02/2022   Chest tightness 02/02/2022   Other allergic rhinitis 02/02/2022   Allergic conjunctivitis of both eyes 02/02/2022   Language barrier 12/11/2020   Morbid obesity ([place])    History of breast cancer    Genetic testing 02/13/2020   Malignant neoplasm of upper-outer quadrant of left breast in female, estrogen receptor positive ([place]) 01/27/2020   Impaired ability to use community resources due to language barrier 07/13/2013    is allergic to omnipaque [iohexol],  doxorubicin hcl, keytruda [pembrolizumab], other, and tape.  MEDICAL HISTORY: Past Medical History:  Diagnosis Date   Allergy    Asthma    Breast cancer in female [facility])    Left   Breast disorder    breast cancer May 2021   Chronic back pain    History of breast cancer    History of COVID-19 04/20/2021   Hx of migraines    Morbid obesity ([place])    Personal history of chemotherapy    Personal history of radiation therapy     SURGICAL HISTORY: Past Surgical History:  Procedure Laterality Date   BREAST LUMPECTOMY     BREAST LUMPECTOMY WITH RADIOACTIVE SEED AND SENTINEL LYMPH NODE BIOPSY Left 07/15/2020   Procedure: LEFT BREAST LUMPECTOMY WITH RADIOACTIVE SEED AND LEFT AXILLARY SENTINEL LYMPH NODE BIOPSY, LEFT AXILLARY NODE RADIOACTIVE SEED GUIDED EXCISION, LEFT BREAST RADIOACTIVE SEED GUIDED EXCISION IM NODE;  Surgeon: Rolm Bookbinder, MD;  Location: [facility];  Service: General;  Laterality: Left;  PEC BLOCK   CESAREAN SECTION WITH BILATERAL TUBAL LIGATION Bilateral 07/23/2015   Procedure: CESAREAN SECTION WITH BILATERAL TUBAL LIGATION;  Surgeon: Donnamae Jude, MD;  Location: [facility];  Service: Obstetrics;  Laterality: Bilateral;   IR IMAGING GUIDED PORT INSERTION  02/11/2022   PORT-A-CATH REMOVAL N/A 04/30/2021   Procedure: REMOVAL PORT-A-CATH;  Surgeon: Rolm Bookbinder, MD;  Location: [facility];  Service: General;  Laterality: N/A;   PORTACATH PLACEMENT Right 02/11/2020   Procedure: INSERTION PORT-A-CATH WITH ULTRASOUND GUIDANCE;  Surgeon: Rolm Bookbinder, MD;  Location: [facility];  Service: General;  Laterality: Right;   TUBAL LIGATION      SOCIAL HISTORY: Social History   Socioeconomic History   Marital status: Legally Separated    Spouse name: Not on file   Number of children: 3   Years of education: Not on file   Highest education level: 9th grade  Occupational History   Occupation: Architect  Tobacco Use   Smoking status: Never    Smokeless tobacco: Never  Vaping Use   Vaping Use: Never used  Substance and Sexual Activity   Alcohol use: No   Drug use: Never   Sexual activity: Yes    Birth control/protection: Surgical  Other Topics Concern   Not on file  Social History Narrative   Not on file   Social Determinants of Health   Financial Resource Strain: Not on file  Food Insecurity: No Food Insecurity (03/29/2022)   Hunger Vital Sign    Worried About Running Out of Food in the Last Year: Never true    Ran Out of Food in the Last Year: Never true  Transportation Needs: No Transportation Needs (03/29/2022)   PRAPARE - Hydrologist (Medical): No    Lack of Transportation (Non-Medical): No  Physical Activity: Not on file  Stress: Not on file  Social Connections: Not on file  Intimate Partner Violence: Not on file    FAMILY HISTORY: Family History  Problem Relation Age of Onset   Heart disease Maternal Grandmother     Review of Systems  Constitutional:  Positive for fatigue. Negative for appetite change, chills, fever and unexpected weight change.  HENT:   Negative for hearing loss, lump/mass and trouble swallowing.   Eyes:  Negative for eye problems and icterus.  Respiratory:  Negative for chest tightness, cough and shortness of breath.   Cardiovascular:  Negative for chest pain, leg swelling and palpitations.  Gastrointestinal:  Negative for abdominal distention, abdominal pain, constipation, diarrhea, nausea and vomiting.  Endocrine: Negative for hot flashes.  Genitourinary:  Negative for difficulty urinating.   Musculoskeletal:  Negative for arthralgias.  Skin:  Positive for rash. Negative for itching.  Neurological:  Negative for dizziness, extremity weakness, headaches and numbness.  Hematological:  Negative for adenopathy. Does not bruise/bleed easily.  Psychiatric/Behavioral:  Negative for depression. The patient is not nervous/anxious.       PHYSICAL  EXAMINATION  ECOG PERFORMANCE STATUS: 1 - Symptomatic but completely ambulatory  Vitals:   06/28/22 0908  BP: 122/73  Pulse: 74  Resp: 16  Temp: 97.9 F (36.6 C)  SpO2: 100%   Physical Exam Constitutional:      Appearance: Normal appearance.  Pulmonary:     Effort: Pulmonary effort is normal.     Breath sounds: Normal breath sounds.  Chest:       Comments: Compared to last visit, left breast appears to be about stable, with some improvement in texture in the inner portion of the left breast.  No new masses. As mentioned in the picture, increased density is now limited to lower and upper outer quadrant. No regional adenopathy. Redness in the middle portion of chest disappeared. Abdominal:     General: Abdomen is flat.     Palpations: Abdomen is soft.  Musculoskeletal:        General: No swelling. Normal range of motion.     Cervical back: Normal range of motion and neck supple. No rigidity.  Lymphadenopathy:     Cervical: No cervical adenopathy.  Neurological:     Mental Status: She is alert.      LABORATORY DATA:  None today  RADIOGRAPHIC STUDIES:  ECHOCARDIOGRAM COMPLETE  Result Date: 06/27/2022    ECHOCARDIOGRAM REPORT   Patient Name:   KRISHIKA BUGGE PERDOMO ULLOA Date of Exam: 06/27/2022 Medical Rec #:  [id number]             Height:       60.0 in Accession #:    [id number]            Weight:       185.0 lb Date of Birth:  11-18-1975             BSA:          1.806 m Patient Age:    68 years              BP:           119/86 mmHg Patient Gender: F                     HR:           79 bpm. Exam Location:  Outpatient Procedure: 2D Echo, Cardiac Doppler, Color Doppler and Strain Analysis Indications:    Chemo Z09  History:        Patient has prior history  of Echocardiogram examinations, most                 recent 04/07/2022. Breast cancer.  Sonographer:    Darlina Sicilian RDCS Referring Phys: [id number] [place]  1. Left ventricular ejection fraction, by  estimation, is 55 to 60%. The left ventricle has normal function. The left ventricle has no regional wall motion abnormalities. Left ventricular diastolic parameters were normal. The average left ventricular global longitudinal strain is -18.7 %. The global longitudinal strain is normal.  2. Right ventricular systolic function is normal. The right ventricular size is normal.  3. The mitral valve is normal in structure. No evidence of mitral valve regurgitation. No evidence of mitral stenosis.  4. The aortic valve is tricuspid. Aortic valve regurgitation is not visualized. No aortic stenosis is present.  5. The inferior vena cava is normal in size with greater than 50% respiratory variability, suggesting right atrial pressure of 3 mmHg. Comparison(s): No significant change from prior study. FINDINGS  Left Ventricle: Left ventricular ejection fraction, by estimation, is 55 to 60%. The left ventricle has normal function. The left ventricle has no regional wall motion abnormalities. The average left ventricular global longitudinal strain is -18.7 %. The global longitudinal strain is normal. The left ventricular internal cavity size was normal in size. There is no left ventricular hypertrophy. Left ventricular diastolic parameters were normal. Right Ventricle: The right ventricular size is normal. Right ventricular systolic function is normal. Left Atrium: Left atrial size was normal in size. Right Atrium: Right atrial size was normal in size. Pericardium: There is no evidence of pericardial effusion. Mitral Valve: The mitral valve is normal in structure. No evidence of mitral valve regurgitation. No evidence of mitral valve stenosis. Tricuspid Valve: The tricuspid valve is normal in structure. Tricuspid valve regurgitation is trivial. No evidence of tricuspid stenosis. Aortic Valve: The aortic valve is tricuspid. Aortic valve regurgitation is not visualized. No aortic stenosis is present. Pulmonic Valve: The pulmonic  valve was normal in structure. Pulmonic valve regurgitation is trivial. No evidence of pulmonic stenosis. Aorta: The aortic root is normal in size and structure. Venous: The inferior vena cava is normal in size with greater than 50% respiratory variability, suggesting right atrial pressure of 3 mmHg. IAS/Shunts: No atrial level shunt detected by color flow Doppler.  LEFT VENTRICLE PLAX 2D LVIDd:         4.80 cm   Diastology LVIDs:         3.00 cm   LV e' medial:    6.53 cm/s LV PW:         0.80 cm   LV E/e' medial:  9.5 LV IVS:        0.70 cm   LV e' lateral:   13.20 cm/s LVOT diam:     2.20 cm   LV E/e' lateral: 4.7 LV SV:         73 LV SV Index:   40        2D Longitudinal Strain LVOT Area:     3.80 cm  2D Strain GLS Avg:     -18.7 %  RIGHT VENTRICLE RV S prime:     13.20 cm/s TAPSE (M-mode): 2.0 cm LEFT ATRIUM             Index        RIGHT ATRIUM          Index LA diam:        3.40 cm 1.88 cm/m  RA Area:     9.71 cm LA Vol (A2C):   35.2 ml 19.49 ml/m  RA Volume:   18.90 ml 10.47 ml/m LA Vol (A4C):   34.8 ml 19.27 ml/m LA Biplane Vol: 37.1 ml 20.55 ml/m  AORTIC VALVE LVOT Vmax:   94.00 cm/s LVOT Vmean:  68.200 cm/s LVOT VTI:    0.191 m  AORTA Ao Root diam: 2.90 cm Ao Asc diam:  2.90 cm MITRAL VALVE MV Area (PHT): 3.31 cm    SHUNTS MV Decel Time: 229 msec    Systemic VTI:  0.19 m MV E velocity: 62.10 cm/s  Systemic Diam: 2.20 cm MV A velocity: 54.40 cm/s MV E/A ratio:  1.14 Kirk Ruths MD Electronically signed by Kirk Ruths MD Signature Date/Time: 06/27/2022/10:03:02 AM    Final       ASSESSMENT and THERAPY PLAN:   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  She was most recently seen for ongoing breast redness,  given a course of antibiotics but had persistent redness hence we proceeded with a punch biopsy.  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  Pathology was sent to [facility] for second opinion as well. This confirmed triple negative breast cancer.  She progressed on after 2 cycles of TC hence we have switched her treatment to Enhertu.   Repeat biopsy demonstrated her HER2 to be 1+. After her first infusion, she came back to the clinic with increasing redness history of treated empirically for cellulitis, gave her a dose of IV vancomycin, oral doxycycline She is now here before planned cycle 6 of Enhertu.  She has had robust response so far with decreased erythema, induration and edema of the left breast, medial portion of the right breast as well as midline. She continues to tolerate enhertu well over all. I dont think the erythema on her back is necessarily related since this fluctuates with no pattern. Overall I am still pleased with the response to Enhertu At this time she is scheduled for one more cycle. I sent an inbasket message to Dr Donne Hazel, hopefully she can be scheduled for surgery in first couple weeks of December. We will review final path and decide on adjuvant treatment recommendations. She is agreeable to this plan With regards to back pain, we will monitor this, she had no metastatic disease in the past. The pattern of back pain is likely musculoskeletal No concern for pneumonitis. ECHO from yesterday stable RTC in 3 weeks or sooner as needed Continue PPI for GERD, decrease dex dose by half and monitor.   All questions were answered. The patient knows to call the clinic with any problems, questions or concerns. We can certainly see the patient much sooner if necessary.  Total time spent: 40 minutes.  Certified Spanish interpreter was used for the entirety of this  conversation  *Total Encounter Time as defined by the Centers for Medicare and Medicaid Services includes, in addition to the face-to-face time of a patient visit (documented in the note above) non-face-to-face time: obtaining and reviewing outside history, ordering and reviewing medications, tests or procedures, care coordination (communications with other health care professionals or caregivers) and documentation  in the medical record.

## 2022-07-18 ENCOUNTER — Encounter: Payer: Self-pay | Admitting: General Surgery

## 2022-07-18 ENCOUNTER — Other Ambulatory Visit: Payer: Self-pay | Admitting: General Surgery

## 2022-07-18 DIAGNOSIS — C50912 Malignant neoplasm of unspecified site of left female breast: Secondary | ICD-10-CM

## 2022-07-18 DIAGNOSIS — C50412 Malignant neoplasm of upper-outer quadrant of left female breast: Secondary | ICD-10-CM

## 2022-07-19 MED FILL — Dexamethasone Sodium Phosphate Inj 100 MG/10ML: INTRAMUSCULAR | Qty: 1 | Status: AC

## 2022-07-20 ENCOUNTER — Other Ambulatory Visit: Payer: Self-pay

## 2022-07-20 ENCOUNTER — Inpatient Hospital Stay: Payer: No Typology Code available for payment source

## 2022-07-20 ENCOUNTER — Inpatient Hospital Stay
Payer: No Typology Code available for payment source | Attending: Hematology and Oncology | Admitting: Hematology and Oncology

## 2022-07-20 ENCOUNTER — Encounter: Payer: Self-pay | Admitting: Hematology and Oncology

## 2022-07-20 DIAGNOSIS — C50412 Malignant neoplasm of upper-outer quadrant of left female breast: Secondary | ICD-10-CM | POA: Insufficient documentation

## 2022-07-20 DIAGNOSIS — C792 Secondary malignant neoplasm of skin: Secondary | ICD-10-CM

## 2022-07-20 DIAGNOSIS — R252 Cramp and spasm: Secondary | ICD-10-CM | POA: Insufficient documentation

## 2022-07-20 DIAGNOSIS — C50912 Malignant neoplasm of unspecified site of left female breast: Secondary | ICD-10-CM

## 2022-07-20 DIAGNOSIS — Z923 Personal history of irradiation: Secondary | ICD-10-CM | POA: Insufficient documentation

## 2022-07-20 DIAGNOSIS — Z170 Estrogen receptor positive status [ER+]: Secondary | ICD-10-CM

## 2022-07-20 DIAGNOSIS — J45909 Unspecified asthma, uncomplicated: Secondary | ICD-10-CM | POA: Insufficient documentation

## 2022-07-20 DIAGNOSIS — Z5112 Encounter for antineoplastic immunotherapy: Secondary | ICD-10-CM | POA: Insufficient documentation

## 2022-07-20 DIAGNOSIS — E669 Obesity, unspecified: Secondary | ICD-10-CM | POA: Insufficient documentation

## 2022-07-20 DIAGNOSIS — Z171 Estrogen receptor negative status [ER-]: Secondary | ICD-10-CM | POA: Insufficient documentation

## 2022-07-20 DIAGNOSIS — Z8616 Personal history of COVID-19: Secondary | ICD-10-CM | POA: Insufficient documentation

## 2022-07-20 DIAGNOSIS — R519 Headache, unspecified: Secondary | ICD-10-CM | POA: Insufficient documentation

## 2022-07-20 DIAGNOSIS — Z95828 Presence of other vascular implants and grafts: Secondary | ICD-10-CM

## 2022-07-20 DIAGNOSIS — Z9221 Personal history of antineoplastic chemotherapy: Secondary | ICD-10-CM | POA: Insufficient documentation

## 2022-07-20 LAB — CBC WITH DIFFERENTIAL (CANCER CENTER ONLY)
Abs Immature Granulocytes: 0.04 10*3/uL (ref 0.00–0.07)
Basophils Absolute: 0 10*3/uL (ref 0.0–0.1)
Basophils Relative: 1 %
Eosinophils Absolute: 0.1 10*3/uL (ref 0.0–0.5)
Eosinophils Relative: 4 %
HCT: 34.9 % — ABNORMAL LOW (ref 36.0–46.0)
Hemoglobin: 11.6 g/dL — ABNORMAL LOW (ref 12.0–15.0)
Immature Granulocytes: 1 %
Lymphocytes Relative: 34 %
Lymphs Abs: 1.2 10*3/uL (ref 0.7–4.0)
MCH: 28.5 pg (ref 26.0–34.0)
MCHC: 33.2 g/dL (ref 30.0–36.0)
MCV: 85.7 fL (ref 80.0–100.0)
Monocytes Absolute: 0.3 10*3/uL (ref 0.1–1.0)
Monocytes Relative: 9 %
Neutro Abs: 1.8 10*3/uL (ref 1.7–7.7)
Neutrophils Relative %: 51 %
Platelet Count: 220 10*3/uL (ref 150–400)
RBC: 4.07 MIL/uL (ref 3.87–5.11)
RDW: 16 % — ABNORMAL HIGH (ref 11.5–15.5)
WBC Count: 3.5 10*3/uL — ABNORMAL LOW (ref 4.0–10.5)
nRBC: 0 % (ref 0.0–0.2)

## 2022-07-20 LAB — CMP (CANCER CENTER ONLY)
ALT: 23 U/L (ref 0–44)
AST: 22 U/L (ref 15–41)
Albumin: 3.8 g/dL (ref 3.5–5.0)
Alkaline Phosphatase: 76 U/L (ref 38–126)
Anion gap: 7 (ref 5–15)
BUN: 16 mg/dL (ref 6–20)
CO2: 27 mmol/L (ref 22–32)
Calcium: 9.2 mg/dL (ref 8.9–10.3)
Chloride: 107 mmol/L (ref 98–111)
Creatinine: 0.8 mg/dL (ref 0.44–1.00)
GFR, Estimated: >60 mL/min (ref >60)
Glucose, Bld: 126 mg/dL — ABNORMAL HIGH (ref 70–99)
Potassium: 3.8 mmol/L (ref 3.5–5.1)
Sodium: 141 mmol/L (ref 135–145)
Total Bilirubin: 0.3 mg/dL (ref 0.3–1.2)
Total Protein: 6.6 g/dL (ref 6.5–8.1)

## 2022-07-20 MED ORDER — FAM-TRASTUZUMAB DERUXTECAN-NXKI CHEMO 100 MG IV SOLR
5.4000 mg/kg | Freq: Once | INTRAVENOUS | Status: AC
  Administered 2022-07-20: 452 mg via INTRAVENOUS
  Filled 2022-07-20: qty 22.6

## 2022-07-20 MED ORDER — ACETAMINOPHEN 325 MG PO TABS
650.0000 mg | ORAL_TABLET | Freq: Once | ORAL | Status: AC
  Administered 2022-07-20: 650 mg via ORAL
  Filled 2022-07-20: qty 2

## 2022-07-20 MED ORDER — SODIUM CHLORIDE 0.9% FLUSH
10.0000 mL | Freq: Once | INTRAVENOUS | Status: AC
  Administered 2022-07-20: 10 mL

## 2022-07-20 MED ORDER — SODIUM CHLORIDE 0.9 % IV SOLN
10.0000 mg | Freq: Once | INTRAVENOUS | Status: AC
  Administered 2022-07-20: 10 mg via INTRAVENOUS
  Filled 2022-07-20: qty 10

## 2022-07-20 MED ORDER — DEXTROSE 5 % IV SOLN: Freq: Once | INTRAVENOUS | Status: AC

## 2022-07-20 MED ORDER — DIPHENHYDRAMINE HCL 25 MG PO CAPS
50.0000 mg | ORAL_CAPSULE | Freq: Once | ORAL | Status: AC
  Administered 2022-07-20: 50 mg via ORAL
  Filled 2022-07-20: qty 2

## 2022-07-20 MED ORDER — PALONOSETRON HCL INJECTION 0.25 MG/5ML
0.2500 mg | Freq: Once | INTRAVENOUS | Status: AC
  Administered 2022-07-20: 0.25 mg via INTRAVENOUS
  Filled 2022-07-20: qty 5

## 2022-07-20 MED ORDER — CYCLOBENZAPRINE HCL 5 MG PO TABS: 5.0000 mg | ORAL_TABLET | Freq: Three times a day (TID) | ORAL | 0 refills | Status: DC | PRN

## 2022-07-20 MED ORDER — SODIUM CHLORIDE 0.9% FLUSH
10.0000 mL | INTRAVENOUS | Status: DC | PRN
  Administered 2022-07-20: 10 mL

## 2022-07-20 MED ORDER — HEPARIN SOD (PORK) LOCK FLUSH 100 UNIT/ML IV SOLN
500.0000 [IU] | Freq: Once | INTRAVENOUS | Status: AC | PRN
  Administered 2022-07-20: 500 [IU]

## 2022-07-20 NOTE — Patient Instructions (Signed)
Instrucciones al darle de alta: Discharge Instructions Gracias por elegir al [facility] de Cncer de  para brindarle atencin mdica de oncologa y Music therapist.   Si usted tiene una cita de laboratorio con [place], por favor vaya directamente [place] y regstrese en el rea de Control and instrumentation engineer.   Use ropa cmoda y [place] para tener fcil acceso a las vas del Portacath (acceso venoso de Engineer, site duracin) o la lnea PICC (catter central colocado por va perifrica).   Nos esforzamos por ofrecerle tiempo de calidad con su proveedor. Es posible que tenga que volver a programar su cita si llega tarde (15 minutos o ms).  El llegar tarde le afecta a usted y a otros pacientes cuyas citas son posteriores a Merchandiser, retail.  Adems, si usted falta a tres o ms citas sin avisar a la oficina, puede ser retirado(a) de la clnica a discrecin del proveedor.      Para las solicitudes de renovacin de recetas, pida a su farmacia que se ponga en contacto con nuestra oficina y deje que transcurran 32 horas para que se complete el proceso de las renovaciones.    Hoy usted recibi los siguientes agentes de quimioterapia e/o inmunoterapia: Enhertu     Para ayudar a prevenir las nuseas y los vmitos despus de su tratamiento, le recomendamos que tome su medicamento para las nuseas segn las indicaciones.  LOS SNTOMAS QUE DEBEN COMUNICARSE INMEDIATAMENTE SE INDICAN A CONTINUACIN: *FIEBRE SUPERIOR A 100.4 F (38 C) O MS *ESCALOFROS O SUDORACIN *NUSEAS Y VMITOS QUE NO SE CONTROLAN CON EL MEDICAMENTO PARA LAS NUSEAS *DIFICULTAD INUSUAL PARA RESPIRAR  *MORETONES O HEMORRAGIAS NO HABITUALES *PROBLEMAS URINARIOS (dolor o ardor al Garment/textile technologist o frecuencia para Garment/textile technologist) *PROBLEMAS INTESTINALES (diarrea inusual, estreimiento, dolor cerca del ano) SENSIBILIDAD EN LA BOCA Y EN LA GARGANTA CON O SIN LA PRESENCIA DE LCERAS (dolor de garganta, llagas en la boca o dolor de muelas/dientes) ERUPCIN,  HINCHAZN O DOLORES INUSUALES FLUJO VAGINAL INUSUAL O PICAZN/RASQUIA    Los puntos marcados con un asterisco ( *) indican una posible emergencia y debe hacer un seguimiento tan pronto como le sea posible o vaya al Departamento de Emergencias si se le presenta algn problema.  Por favor, muestre la [place] DE ADVERTENCIA DE Windy Canny DE ADVERTENCIA DE Benay Spice al registrarse en [address] de Emergencias y a la enfermera de triaje.  Si tiene preguntas despus de su visita o necesita cancelar o volver a programar su cita, por favor pngase en contacto con [place]  Dept: [phone] y [place]. Las horas de oficina son de 8:00 a.m. a 4:30 p.m. de lunes a viernes. Por favor, tenga en cuenta que los mensajes de voz que se dejan despus de las 4:00 p.m. posiblemente no se devolvern hasta el siguiente da de [place].  Cerramos los fines de semana y [employer]. En todo momento tiene acceso a una enfermera para preguntas urgentes. Por favor, llame al nmero principal de la clnica Dept: [phone] y [place] instrucciones.   Para cualquier pregunta que no sea de carcter urgente, tambin puede ponerse en contacto con su proveedor [employer] Ahora ofrecemos visitas electrnicas para cualquier persona mayor de 18 aos que solicite atencin mdica en lnea para los sntomas que no sean urgentes. Para ms detalles vaya a [email or web address].   Tambin puede bajar la aplicacin de MyChart! Vaya a la tienda de aplicaciones, busque "MyChart", abra la aplicacin, seleccione [facility], e ingrese con su nombre de usuario y la contrasea de Pharmacist, community.  Las mscaras son opcionales en los centros de Hotel manager. Si desea que su equipo de cuidados mdicos use una [employer] atienden, por favor hgaselo saber al personal. Bethann Berkshire una persona de apoyo que tenga por lo menos 16 aos para que le acompae a sus  citas.

## 2022-07-20 NOTE — Assessment & Plan Note (Signed)
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  She was most recently seen for ongoing breast redness, given a course of antibiotics but had persistent redness hence we proceeded with a punch biopsy.  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  Pathology was sent to [facility] for second opinion as well. This confirmed triple negative breast cancer.  She progressed on after 2 cycles of TC hence we have switched her treatment to Enhertu.   Repeat biopsy demonstrated her HER2 to be 1+. After her first infusion, she came back to the clinic with increasing redness history of treated empirically for cellulitis, gave her a dose of IV vancomycin, oral doxycycline She is now here before planned cycle 4 of Enhertu.  She has had robust response so far with decreased erythema, induration and edema of the left breast, medial portion of the right breast as well as midline. She is now here before planned 6 cycle of Enhertu, anticipate mastectomy on December 11. She appears to be tolerating Enhertu very well except for some abdominal discomfort for which she has been taking Prilosec.  I recommended that she take Prilosec twice daily.  There is no concern for cardiotoxicity or interstitial pneumonitis based on symptoms today.  Okay to proceed with chemotherapy as planned and return to clinic after surgery for further recommendations  #Worsening headaches, she reports worsening headaches in the past 2 to 3 weeks.  She is very concerned about the possibility of metastatic disease.  We have done an MRI brain back in July when she reported with headaches which is negative but she would like some further reassurance since it has been many months and since her headaches have gotten worse in the past 2 to 3 weeks.  Her clinical presentation is very less likely  consistent with metastasis but given her inflammatory subtype and new worsening symptoms, I think it is reasonable to reorder the MRI, this has been ordered.  #Muscle cramps in the left chest wall, will give her some Flexeril to use as needed.  #Mild cough without any shortness of breath, no hypoxia noted.  She had no evidence of coughing spells during the visit.  Physical examination completely unremarkable.  I do not believe there is any Enhertu related pneumonitis at this time hence we will continue with Enhertu as planned for today

## 2022-07-20 NOTE — Progress Notes (Signed)
[facility] Follow up:    [name], NP [address], [address] [place] [address]   DIAGNOSIS:  Cancer Staging  Malignant neoplasm of upper-outer quadrant of left breast in female, estrogen receptor positive ([place]) Staging form: Breast, AJCC 8th Edition - Clinical stage from 01/29/2020: Stage IIIA (cT2, cN1, cM0, G3, ER+, PR-, HER2-) - Signed by Benay Pike, MD on 06/28/2022 Stage prefix: Initial diagnosis Histologic grading system: 3 grade system - Pathologic stage from 07/15/2020: No Stage Recommended (ypT1b, pN0, cM0, G3, ER-, PR-, HER2-) - Signed by Gardenia Phlegm, NP on 04/01/2022 Stage prefix: Post-therapy Histologic grading system: 3 grade system   SUMMARY OF ONCOLOGIC HISTORY: Oncology History  Malignant neoplasm of upper-outer quadrant of left breast in female, estrogen receptor positive ([place])  01/23/2020 Initial Diagnosis   [place] woman status post left breast upper outer quadrant biopsy 01/23/2020 for a clinical T3 N1, 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             (a) chest CT scan and bone scan 02/07/2020 showed no evidence of metastatic disease   01/29/2020 Cancer Staging   Staging form: Breast, AJCC 8th Edition - Clinical stage from 01/29/2020: Stage IIIA (cT2, cN1, cM0, G3, ER+, PR-, HER2-) - Signed by Benay Pike, MD on 06/28/2022 Stage prefix: Initial diagnosis Histologic grading system: 3 grade system   02/07/2020 Genetic Testing   Negative genetic testing. No pathogenic variants identified on the [facility] Common Hereditary Cancers Panel. VUS in MSH6 called c.2744C>G identified. The report date is 02/07/2020.  The Common Hereditary Cancers Panel offered by [facility] includes sequencing and/or deletion duplication testing of the following 48 genes: APC, ATM, AXIN2, BARD1, BMPR1A, BRCA1, BRCA2, BRIP1, CDH1, CDKN2A (p14ARF), CDKN2A (p16INK4a), CKD4, CHEK2,  CTNNA1, DICER1, EPCAM (Deletion/duplication testing only), GREM1 (promoter region deletion/duplication testing only), KIT, MEN1, MLH1, MSH2, MSH3, MSH6, MUTYH, NBN, NF1, NHTL1, PALB2, PDGFRA, PMS2, POLD1, POLE, PTEN, RAD50, RAD51C, RAD51D, RNF43, SDHB, SDHC, SDHD, SMAD4, SMARCA4. STK11, TP53, TSC1, TSC2, and VHL.  The following genes were evaluated for sequence changes only: SDHA and HOXB13 c.251G>A variant only.   02/12/2020 - 05/28/2020 Neo-Adjuvant Chemotherapy   neoadjuvant chemotherapy consisting of doxorubicin and cyclophosphamide in dose dense fashion x4 started 02/12/2020, completed 03/25/2020, followed by paclitaxel and carboplatin weekly x12 started 04/08/2020 and completed on 05/28/2020             (a) echo 02/07/2020 shows an ejection fraction in the 55-60% range             (b) Epirubicin substituted for Doxorubicin starting with cycle 2 of AC secondary to allergic rash from Doxorubicin   07/15/2020 Surgery   status post left lumpectomy and sentinel lymph node sampling 07/15/2020 for a ypT1b ypN0 residual invasive ductal carcinoma, grade 3, with negative margins.             (a) a total of 4 left axillary lymph nodes were removed             (b) repeat prognostic panel on the final pathology again triple negative, with an MIB-1 of 50%.   07/15/2020 Cancer Staging   Staging form: Breast, AJCC 8th Edition - Pathologic stage from 07/15/2020: No Stage Recommended (ypT1b, pN0, cM0, G3, ER-, PR-, HER2-) - Signed by Gardenia Phlegm, NP on 04/01/2022 Stage prefix: Post-therapy Histologic grading system: 3 grade system   08/26/2020 - 10/09/2020 Radiation Therapy   adjuvant radiation  completed 10/09/2020             (a) sensitizing capecitabine attempted but not tolerated by the patient  08/26/2020 through 10/09/2020 Site Technique Total Dose (Gy) Dose per Fx (Gy) Completed Fx Beam Energies  Breast, Left: Breast_Lt 3D 50/50 2 25/25 15X  Breast, Left: Breast_Lt_PAB_SCV 3D 50/50 2 25/25 10X,  15X  Breast, Left: Breast_Lt_Bst 3D 10/10 2 5/5 6X, 10X    11/23/2020 - 01/04/2021 Adjuvant Chemotherapy   adjuvant pembrolizumab started 11/23/2020, to continue for 1 year             (a) discontinued after 01/04/2021 dose with multiple side effects requiring steroids for resolution   03/30/2022 Mammogram   Mammogram and ultrasound show numerous masses throughout left breast not visualized in 01/2022, +left axillary lymphadenopathy, skin thickening in right breast and in upper abdomen just below sternum   04/05/2022 -  Chemotherapy   Patient is on Treatment Plan : BREAST METASTATIC Fam-Trastuzumab Deruxtecan-nxki (Enhertu) (5.4) q21d     04/06/2022 - 04/26/2022 Chemotherapy   Patient is on Treatment Plan : BREAST METASTATIC fam-trastuzumab deruxtecan-nxki (Enhertu) q21d     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  Prognostic panel sent to [facility]: Left breast skin, punch biopsy: Skin with dermal lymphatic involvement by carcinoma (confirmed with outside CK7 IHC, reviewed). Biomarkers are reported as negative for ER, PR, Her2/neu (confirmed on review but please note sample size is extremely small).   02/15/2022 - 03/08/2022 Chemotherapy    Taxotere/Cytoxan x 2, discontinued due to progression of breast.   03/30/2022 Mammogram   Mammogram and ultrasound show numerous masses throughout left breast not visualized in 01/2022, +left axillary lymphadenopathy, skin thickening in right breast and in upper abdomen just below sternum   04/05/2022 -  Chemotherapy   Patient is on Treatment Plan : BREAST METASTATIC Fam-Trastuzumab Deruxtecan-nxki (Enhertu) (5.4) q21d     04/06/2022 - 04/26/2022 Chemotherapy   Patient is on Treatment Plan : BREAST METASTATIC fam-trastuzumab deruxtecan-nxki (Enhertu) q21d      CURRENT THERAPY: progression on Taxotere and cytoxan  INTERVAL HISTORY:  Salaya Holtrop 46 y.o.  female returns for follow-up of her breast cancer.   She is currently planned for surgery on August 22, 2022.  She is now here before her planned cycle 6 of Enhertu. She is here with her uncle. He acted a s [employer] laboratory technician. She says that she has stomach pain when she eats, headaches and some breast pain. She describes the headaches on the left side.  She is very worried that the headaches could be related to her breast cancer.  Other than the headache she has reported some severe muscle cramps in the left chest radiating from the front to the back.  She had 1 episode of such severe cramps which almost lasted for 30 minutes, she felt like she was almost dying.  She has noticed a mild cough, no shortness of breath reported.  She did not have any coughing spells while she was in the clinic and her oxygenation was well.  She is understandably anxious about the surgery but she is ready for it. 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[place]) 04/01/2022   Encounter for dental  examination 03/11/2022   Acute apical periodontitis 03/11/2022   Phobia of dental procedure 03/11/2022   Defective dental restoration 03/11/2022   Accretions on teeth 03/11/2022   Chronic periodontitis 03/11/2022   Teeth missing 03/11/2022   Caries 03/11/2022   Generalized gingival recession 03/11/2022   Pain, dental 03/08/2022   Cancer-related pain 03/08/2022   Port-A-Cath in place 02/15/2022   Oral allergy syndrome, subsequent encounter 02/03/2022   Adverse effect of other drugs, medicaments and biological substances, subsequent encounter 02/02/2022   Other adverse food reactions, not elsewhere classified, subsequent encounter 02/02/2022   Chest tightness 02/02/2022   Other allergic rhinitis 02/02/2022   Allergic  conjunctivitis of both eyes 02/02/2022   Language barrier 12/11/2020   Morbid obesity ([place])    History of breast cancer    Genetic testing 02/13/2020   Malignant neoplasm of upper-outer quadrant of left breast in female, estrogen receptor positive ([place]) 01/27/2020   Impaired ability to use community resources due to language barrier 07/13/2013    is allergic to omnipaque [iohexol], doxorubicin hcl, keytruda [pembrolizumab], other, and tape.  MEDICAL HISTORY: Past Medical History:  Diagnosis Date   Allergy    Asthma    Breast cancer in female [facility])    Left   Breast disorder    breast cancer May 2021   Chronic back pain    History of breast cancer    History of COVID-19 04/20/2021   Hx of migraines    Morbid obesity ([place])    Personal history of chemotherapy    Personal history of radiation therapy     SURGICAL HISTORY: Past Surgical History:  Procedure Laterality Date   BREAST LUMPECTOMY     BREAST LUMPECTOMY WITH RADIOACTIVE SEED AND SENTINEL LYMPH NODE BIOPSY Left 07/15/2020   Procedure: LEFT BREAST LUMPECTOMY WITH RADIOACTIVE SEED AND LEFT AXILLARY SENTINEL LYMPH NODE BIOPSY, LEFT AXILLARY NODE RADIOACTIVE SEED GUIDED EXCISION, LEFT BREAST RADIOACTIVE SEED GUIDED EXCISION IM NODE;  Surgeon: Rolm Bookbinder, MD;  Location: [facility];  Service: General;  Laterality: Left;  PEC BLOCK   CESAREAN SECTION WITH BILATERAL TUBAL LIGATION Bilateral 07/23/2015   Procedure: CESAREAN SECTION WITH BILATERAL TUBAL LIGATION;  Surgeon: Donnamae Jude, MD;  Location: [facility];  Service: Obstetrics;  Laterality: Bilateral;   IR IMAGING GUIDED PORT INSERTION  02/11/2022   PORT-A-CATH REMOVAL N/A 04/30/2021   Procedure: REMOVAL PORT-A-CATH;  Surgeon: Rolm Bookbinder, MD;  Location: [facility];  Service: General;  Laterality: N/A;   PORTACATH PLACEMENT Right 02/11/2020   Procedure: INSERTION PORT-A-CATH WITH ULTRASOUND GUIDANCE;  Surgeon: Rolm Bookbinder, MD;  Location: [facility];  Service: General;  Laterality: Right;   TUBAL LIGATION      SOCIAL HISTORY: Social History   Socioeconomic History   Marital status: Legally Separated    Spouse name: Not on file   Number of children: 3   Years of education: Not on file   Highest education level: 9th grade  Occupational History   Occupation: Architect  Tobacco Use   Smoking status: Never   Smokeless tobacco: Never  Vaping Use   Vaping Use: Never used  Substance and Sexual Activity   Alcohol use: No   Drug use: Never   Sexual activity: Yes    Birth control/protection: Surgical  Other Topics Concern   Not on file  Social History Narrative   Not on file   Social Determinants of Health   Financial Resource Strain: Not on file  Food Insecurity: No Food Insecurity (  03/29/2022)   Hunger Vital Sign    Worried About Running Out of Food in the Last Year: Never true    [place] in the Last Year: Never true  Transportation Needs: No Transportation Needs (03/29/2022)   PRAPARE - Hydrologist (Medical): No    Lack of Transportation (Non-Medical): No  Physical Activity: Not on file  Stress: Not on file  Social Connections: Not on file  Intimate Partner Violence: Not on file    FAMILY HISTORY: Family History  Problem Relation Age of Onset   Heart disease Maternal Grandmother     Review of Systems  Constitutional:  Positive for fatigue. Negative for appetite change, chills, fever and unexpected weight change.  HENT:   Negative for hearing loss, lump/mass and trouble swallowing.   Eyes:  Negative for eye problems and icterus.  Respiratory:  Negative for chest tightness, cough and shortness of breath.   Cardiovascular:  Negative for chest pain, leg swelling and palpitations.  Gastrointestinal:  Positive for abdominal pain. Negative for abdominal distention, constipation, diarrhea, nausea and vomiting.  Endocrine: Negative for hot flashes.  Genitourinary:   Negative for difficulty urinating.   Musculoskeletal:  Negative for arthralgias.  Skin:  Positive for rash. Negative for itching.  Neurological:  Positive for headaches. Negative for dizziness, extremity weakness and numbness.  Hematological:  Negative for adenopathy. Does not bruise/bleed easily.  Psychiatric/Behavioral:  Negative for depression. The patient is not nervous/anxious.       PHYSICAL EXAMINATION  ECOG PERFORMANCE STATUS: 1 - Symptomatic but completely ambulatory  Vitals:   07/20/22 0914  BP: 135/77  Pulse: 97  Resp: 18  Temp: 98 F (36.7 C)  SpO2: 100%   Physical Exam Constitutional:      Appearance: Normal appearance.  Pulmonary:     Effort: Pulmonary effort is normal.     Breath sounds: Normal breath sounds.  Chest:       Comments: Once again no significant change.  Inner portion of the left breast appears to be softer, the bottom portion of the left breast appears denser could also be related to gravity.  No palpable masses.  No regional adenopathy.  The erythema in the midline as well as the inner portion of the right breast has since resolved.  She continues to have some erythema on the posterior chest wall which may be unrelated given that this has not responded at all to treatment and has been discordant when compared to the known breast cancer. Abdominal:     General: Abdomen is flat.     Palpations: Abdomen is soft.  Musculoskeletal:        General: No swelling. Normal range of motion.     Cervical back: Normal range of motion and neck supple. No rigidity.  Lymphadenopathy:     Cervical: No cervical adenopathy.  Neurological:     Mental Status: She is alert.      LABORATORY DATA:  None today  RADIOGRAPHIC STUDIES:  No results found.    ASSESSMENT and THERAPY PLAN:   Malignant neoplasm of upper-outer quadrant of left breast in female, estrogen receptor positive ([place]) [name] is a 46 year old Spanish-speaking woman who has a history of  functionally triple negative breast cancer.  She was most recently seen for ongoing breast redness, given a course of antibiotics but had persistent redness hence we proceeded with a punch biopsy.  This showed the presence of inflammatory breast cancer, prognostic showed ER/PR and HER2 negative  however proliferation index was noted low at 1%.  Pathology was sent to [facility] for second opinion as well. This confirmed triple negative breast cancer.  She progressed on after 2 cycles of TC hence we have switched her treatment to Enhertu.   Repeat biopsy demonstrated her HER2 to be 1+. After her first infusion, she came back to the clinic with increasing redness history of treated empirically for cellulitis, gave her a dose of IV vancomycin, oral doxycycline She is now here before planned cycle 4 of Enhertu.  She has had robust response so far with decreased erythema, induration and edema of the left breast, medial portion of the right breast as well as midline. She is now here before planned 6 cycle of Enhertu, anticipate mastectomy on December 11. She appears to be tolerating Enhertu very well except for some abdominal discomfort for which she has been taking Prilosec.  I recommended that she take Prilosec twice daily.  There is no concern for cardiotoxicity or interstitial pneumonitis based on symptoms today.  Okay to proceed with chemotherapy as planned and return to clinic after surgery for further recommendations  #Worsening headaches, she reports worsening headaches in the past 2 to 3 weeks.  She is very concerned about the possibility of metastatic disease.  We have done an MRI brain back in July when she reported with headaches which is negative but she would like some further reassurance since it has been many months and since her headaches have gotten worse in the past 2 to 3 weeks.  Her clinical presentation is very less likely consistent with metastasis but given her inflammatory subtype and new  worsening symptoms, I think it is reasonable to reorder the MRI, this has been ordered.  #Muscle cramps in the left chest wall, will give her some Flexeril to use as needed.  #Mild cough without any shortness of breath, no hypoxia noted.  She had no evidence of coughing spells during the visit.  Physical examination completely unremarkable.  I do not believe there is any Enhertu related pneumonitis at this time hence we will continue with Enhertu as planned for today    All questions were answered. The patient knows to call the clinic with any problems, questions or concerns. We can certainly see the patient much sooner if necessary.  Total time spent: 40 minutes.  Certified Spanish interpreter was used for the entirety of this conversation  *Total Encounter Time as defined by the Centers for Medicare and Medicaid Services includes, in addition to the face-to-face time of a patient visit (documented in the note above) non-face-to-face time: obtaining and reviewing outside history, ordering and reviewing medications, tests or procedures, care coordination (communications with other health care professionals or caregivers) and documentation in the medical record.

## 2022-07-29 ENCOUNTER — Ambulatory Visit
Admission: RE | Admit: 2022-07-29 | Discharge: 2022-07-29 | Disposition: A | Discharge status: Home / Self Care | Payer: No Typology Code available for payment source | Source: Ambulatory Visit | Attending: General Surgery | Admitting: General Surgery

## 2022-07-29 ENCOUNTER — Encounter: Payer: Self-pay | Admitting: Hematology and Oncology

## 2022-07-29 DIAGNOSIS — C50912 Malignant neoplasm of unspecified site of left female breast: Secondary | ICD-10-CM

## 2022-08-03 ENCOUNTER — Ambulatory Visit (HOSPITAL_COMMUNITY)
Admission: RE | Admit: 2022-08-03 | Discharge: 2022-08-03 | Disposition: A | Discharge status: Home / Self Care | Payer: No Typology Code available for payment source | Source: Ambulatory Visit | Attending: Hematology and Oncology | Admitting: Hematology and Oncology

## 2022-08-03 DIAGNOSIS — Z170 Estrogen receptor positive status [ER+]: Secondary | ICD-10-CM | POA: Insufficient documentation

## 2022-08-03 DIAGNOSIS — C50412 Malignant neoplasm of upper-outer quadrant of left female breast: Secondary | ICD-10-CM | POA: Insufficient documentation

## 2022-08-03 MED ORDER — GADOBUTROL 1 MMOL/ML IV SOLN
9.0000 mL | Freq: Once | INTRAVENOUS | Status: AC | PRN
  Administered 2022-08-03: 9 mL via INTRAVENOUS

## 2022-08-11 ENCOUNTER — Other Ambulatory Visit: Payer: Self-pay | Admitting: General Surgery

## 2022-08-11 NOTE — Progress Notes (Signed)
Sent message, via epic in basket, requesting orders in epic from surgeon.  

## 2022-08-15 NOTE — Patient Instructions (Signed)
SURGICAL WAITING ROOM VISITATION Patients having surgery or a procedure may have no more than 2 support people in the waiting area - these visitors may rotate.   Children under the age of 33 must have an adult with them who is not the patient. If the patient needs to stay at the hospital during part of their recovery, the visitor guidelines for inpatient rooms apply. Pre-op nurse will coordinate an appropriate time for 1 support person to accompany patient in pre-op.  This support person may not rotate.    Please refer to the [facility] website for the visitor guidelines for Inpatients (after your surgery is over and you are in a regular room).       Your procedure is scheduled on: 08-22-22   Report to [facility] Main Entrance    Report to admitting at    [place] AM   Call this number if you have problems the morning of surgery [phone]   Do not eat food :After Midnight.   After Midnight you may have the following liquids until 0430______ AM DAY OF SURGERY  THEN NOTHING BY MOUTH  Water Non-Citrus Juices (without pulp, NO RED) Carbonated Beverages Black Coffee (NO MILK/CREAM OR CREAMERS, sugar ok)  Clear Tea (NO MILK/CREAM OR CREAMERS, sugar ok) regular and decaf                             Plain Jell-O (NO RED)                                           Fruit ices (not with fruit pulp, NO RED)                                     Popsicles (NO RED)                                                               Sports drinks like Gatorade (NO RED)                      FOLLOW  ANY ADDITIONAL PRE OP INSTRUCTIONS YOU RECEIVED FROM YOUR SURGEON'S OFFICE!!!     Oral Hygiene is also important to reduce your risk of infection.                                    Remember - BRUSH YOUR TEETH THE MORNING OF SURGERY WITH YOUR REGULAR TOOTHPASTE  DENTURES WILL BE REMOVED PRIOR TO SURGERY PLEASE DO NOT APPLY "Poly grip" OR ADHESIVES!!!   Do NOT smoke after Midnight   Take  these medicines the morning of surgery with A SIP OF WATER: Doxycycline    Bring CPAP mask and tubing day of surgery.                              You may not have any metal on your body including hair pins,  jewelry, and body piercing             Do not wear make-up, lotions, powders, perfumes/cologne, or deodorant  Do not wear nail polish including gel and S&S, artificial/acrylic nails, or any other type of covering on natural nails including finger and toenails. If you have artificial nails, gel coating, etc. that needs to be removed by a nail salon please have this removed prior to surgery or surgery may need to be canceled/ delayed if the surgeon/ anesthesia feels like they are unable to be safely monitored.   Do not shave  48 hours prior to surgery.              Do not bring valuables to the hospital. [place].   Contacts, glasses, dentures or bridgework may not be worn into surgery.   Bring small overnight bag day of surgery.   DO NOT [place]. PHARMACY WILL DISPENSE MEDICATIONS LISTED ON YOUR MEDICATION LIST TO YOU DURING YOUR ADMISSION [place]!    Patients discharged on the day of surgery will not be allowed to drive home.  Someone NEEDS to stay with you for the first 24 hours after anesthesia.   Special Instructions: Bring a copy of your healthcare power of attorney and living will documents the day of surgery if you haven't scanned them before.              Please read over the following fact sheets you were given: IF [place] [phone]   If you received a COVID test during your pre-op visit  it is requested that you wear a mask when out in public, stay away from anyone that may not be feeling well and notify your surgeon if you develop symptoms. If you test positive for Covid or have been in contact with anyone that has tested  positive in the last 10 days please notify you surgeon.    [facility] - Preparing for Surgery Before surgery, you can play an important role.  Because skin is not sterile, your skin needs to be as free of germs as possible.  You can reduce the number of germs on your skin by washing with CHG (chlorahexidine gluconate) soap before surgery.  CHG is an antiseptic cleaner which kills germs and bonds with the skin to continue killing germs even after washing. Please DO NOT use if you have an allergy to CHG or antibacterial soaps.  If your skin becomes reddened/irritated stop using the CHG and inform your nurse when you arrive at Short Stay. Do not shave (including legs and underarms) for at least 48 hours prior to the first CHG shower.  You may shave your face/neck. Please follow these instructions carefully:  1.  Shower with CHG Soap the night before surgery and the  morning of Surgery.  2.  If you choose to wash your hair, wash your hair first as usual with your  normal  shampoo.  3.  After you shampoo, rinse your hair and body thoroughly to remove the  shampoo.                           4.  Use CHG as you would any other liquid soap.  You can apply chg directly  to the skin  and wash                       Gently with a scrungie or clean washcloth.  5.  Apply the CHG Soap to your body ONLY FROM THE NECK DOWN.   Do not use on face/ open                           Wound or open sores. Avoid contact with eyes, ears mouth and genitals (private parts).                       Wash face,  Genitals (private parts) with your normal soap.             6.  Wash thoroughly, paying special attention to the area where your surgery  will be performed.  7.  Thoroughly rinse your body with warm water from the neck down.  8.  DO NOT shower/wash with your normal soap after using and rinsing off  the CHG Soap.                9.  Pat yourself dry with a clean towel.            10.  Wear clean pajamas.            11.  Place  clean sheets on your bed the night of your first shower and do not  sleep with pets. Day of Surgery : Do not apply any lotions/deodorants the morning of surgery.  Please wear clean clothes to the hospital/surgery center.  FAILURE TO FOLLOW THESE INSTRUCTIONS MAY RESULT IN THE CANCELLATION OF YOUR SURGERY PATIENT SIGNATURE_________________________________  NURSE SIGNATURE__________________________________  ________________________________________________________________________

## 2022-08-15 NOTE — Progress Notes (Signed)
PCP - Lazaro Arms, FNP Cardiologist - no  PPM/ICD -  Device Orders -  Rep Notified -   Chest x-ray -  EKG -  Stress Test -  ECHO - 06-27-22 epic Cardiac Cath -   Sleep Study -  CPAP -   Fasting Blood Sugar -  Checks Blood Sugar _____ times a day  Blood Thinner Instructions: Aspirin Instructions:  ERAS Protcol -y PRE-SURGERY    COVID vaccine -J&J and a booster  Activity--Able to walk a flight of stairs without SOB Anesthesia review:   Patient denies shortness of breath, fever, cough and chest pain at PAT appointment   All instructions explained to the patient, with a verbal understanding of the material. Patient agrees to go over the instructions while at home for a better understanding. Patient also instructed to self quarantine after being tested for COVID-19. The opportunity to ask questions was provided.

## 2022-08-16 ENCOUNTER — Inpatient Hospital Stay
Payer: No Typology Code available for payment source | Attending: Hematology and Oncology | Admitting: Hematology and Oncology

## 2022-08-16 DIAGNOSIS — Z8616 Personal history of COVID-19: Secondary | ICD-10-CM | POA: Insufficient documentation

## 2022-08-16 DIAGNOSIS — J45909 Unspecified asthma, uncomplicated: Secondary | ICD-10-CM | POA: Insufficient documentation

## 2022-08-16 DIAGNOSIS — Z170 Estrogen receptor positive status [ER+]: Secondary | ICD-10-CM

## 2022-08-16 DIAGNOSIS — E669 Obesity, unspecified: Secondary | ICD-10-CM | POA: Insufficient documentation

## 2022-08-16 DIAGNOSIS — Z171 Estrogen receptor negative status [ER-]: Secondary | ICD-10-CM | POA: Insufficient documentation

## 2022-08-16 DIAGNOSIS — Z923 Personal history of irradiation: Secondary | ICD-10-CM | POA: Insufficient documentation

## 2022-08-16 DIAGNOSIS — L539 Erythematous condition, unspecified: Secondary | ICD-10-CM | POA: Insufficient documentation

## 2022-08-16 DIAGNOSIS — C50412 Malignant neoplasm of upper-outer quadrant of left female breast: Secondary | ICD-10-CM

## 2022-08-16 DIAGNOSIS — G893 Neoplasm related pain (acute) (chronic): Secondary | ICD-10-CM | POA: Insufficient documentation

## 2022-08-16 DIAGNOSIS — Z9221 Personal history of antineoplastic chemotherapy: Secondary | ICD-10-CM | POA: Insufficient documentation

## 2022-08-16 DIAGNOSIS — R519 Headache, unspecified: Secondary | ICD-10-CM | POA: Insufficient documentation

## 2022-08-16 DIAGNOSIS — R5383 Other fatigue: Secondary | ICD-10-CM | POA: Insufficient documentation

## 2022-08-16 MED ORDER — DOXYCYCLINE HYCLATE 100 MG PO TABS: 100.0000 mg | ORAL_TABLET | Freq: Two times a day (BID) | ORAL | 0 refills | Status: DC

## 2022-08-17 ENCOUNTER — Encounter: Payer: Self-pay | Admitting: Hematology and Oncology

## 2022-08-17 NOTE — Progress Notes (Signed)
[name]:    [name], NP [address] [place] [address]   DIAGNOSIS:  Cancer Staging  Malignant neoplasm of upper-outer quadrant of left breast in female, estrogen receptor positive ([name]) Staging form: Breast, AJCC 8th Edition - Clinical stage from 01/29/2020: Stage IIIA (cT2, cN1, cM0, G3, ER+, PR-, HER2-) - Signed by Benay Pike, MD on 06/28/2022 Stage prefix: Initial diagnosis Histologic grading system: 3 grade system - Pathologic stage from 07/15/2020: No Stage Recommended (ypT1b, pN0, cM0, G3, ER-, PR-, HER2-) - Signed by Gardenia Phlegm, NP on 04/01/2022 Stage prefix: Post-therapy Histologic grading system: 3 grade system   SUMMARY OF ONCOLOGIC HISTORY: Oncology History  Malignant neoplasm of upper-outer quadrant of left breast in female, estrogen receptor positive ([name])  01/23/2020 Initial Diagnosis   [place] woman status post left breast upper outer quadrant biopsy 01/23/2020 for a clinical T3 N1, 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             (a) chest CT scan and bone scan 02/07/2020 showed no evidence of metastatic disease   01/29/2020 Cancer Staging   Staging form: Breast, AJCC 8th Edition - Clinical stage from 01/29/2020: Stage IIIA (cT2, cN1, cM0, G3, ER+, PR-, HER2-) - Signed by Benay Pike, MD on 06/28/2022 Stage prefix: Initial diagnosis Histologic grading system: 3 grade system   02/07/2020 Genetic Testing   Negative genetic testing. No pathogenic variants identified on the [facility] Common Hereditary Cancers Panel. VUS in MSH6 called c.2744C>G identified. The report date is 02/07/2020.  The Common Hereditary Cancers Panel offered by [facility] includes sequencing and/or deletion duplication testing of the following 48 genes: APC, ATM, AXIN2, BARD1, BMPR1A, BRCA1, BRCA2, BRIP1, CDH1, CDKN2A (p14ARF), CDKN2A (p16INK4a), CKD4, CHEK2,  CTNNA1, DICER1, EPCAM (Deletion/duplication testing only), GREM1 (promoter region deletion/duplication testing only), KIT, MEN1, MLH1, MSH2, MSH3, MSH6, MUTYH, NBN, NF1, NHTL1, PALB2, PDGFRA, PMS2, POLD1, POLE, PTEN, RAD50, RAD51C, RAD51D, RNF43, SDHB, SDHC, SDHD, SMAD4, SMARCA4. STK11, TP53, TSC1, TSC2, and VHL.  The following genes were evaluated for sequence changes only: SDHA and HOXB13 c.251G>A variant only.   02/12/2020 - 05/28/2020 Neo-Adjuvant Chemotherapy   neoadjuvant chemotherapy consisting of doxorubicin and cyclophosphamide in dose dense fashion x4 started 02/12/2020, completed 03/25/2020, followed by paclitaxel and carboplatin weekly x12 started 04/08/2020 and completed on 05/28/2020             (a) echo 02/07/2020 shows an ejection fraction in the 55-60% range             (b) Epirubicin substituted for Doxorubicin starting with cycle 2 of AC secondary to allergic rash from Doxorubicin   07/15/2020 Surgery   status post left lumpectomy and sentinel lymph node sampling 07/15/2020 for a ypT1b ypN0 residual invasive ductal carcinoma, grade 3, with negative margins.             (a) a total of 4 left axillary lymph nodes were removed             (b) repeat prognostic panel on the final pathology again triple negative, with an MIB-1 of 50%.   07/15/2020 Cancer Staging   Staging form: Breast, AJCC 8th Edition - Pathologic stage from 07/15/2020: No Stage Recommended (ypT1b, pN0, cM0, G3, ER-, PR-, HER2-) - Signed by Gardenia Phlegm, NP on 04/01/2022 Stage prefix: Post-therapy Histologic grading system: 3 grade system   08/26/2020 - 10/09/2020 Radiation Therapy   adjuvant radiation  completed 10/09/2020             (a) sensitizing capecitabine attempted but not tolerated by the patient  08/26/2020 through 10/09/2020 Site Technique Total Dose (Gy) Dose per Fx (Gy) Completed Fx Beam Energies  Breast, Left: Breast_Lt 3D 50/50 2 25/25 15X  Breast, Left: Breast_Lt_PAB_SCV 3D 50/50 2 25/25 10X,  15X  Breast, Left: Breast_Lt_Bst 3D 10/10 2 5/5 6X, 10X    11/23/2020 - 01/04/2021 Adjuvant Chemotherapy   adjuvant pembrolizumab started 11/23/2020, to continue for 1 year             (a) discontinued after 01/04/2021 dose with multiple side effects requiring steroids for resolution   03/30/2022 Mammogram   Mammogram and ultrasound show numerous masses throughout left breast not visualized in 01/2022, +left axillary lymphadenopathy, skin thickening in right breast and in upper abdomen just below sternum   04/05/2022 -  Chemotherapy   Patient is on Treatment Plan : BREAST METASTATIC Fam-Trastuzumab Deruxtecan-nxki (Enhertu) (5.4) q21d     04/06/2022 - 04/26/2022 Chemotherapy   Patient is on Treatment Plan : BREAST METASTATIC fam-trastuzumab deruxtecan-nxki (Enhertu) q21d     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  Prognostic panel sent to [facility]: Left breast skin, punch biopsy: Skin with dermal lymphatic involvement by carcinoma (confirmed with outside CK7 IHC, reviewed). Biomarkers are reported as negative for ER, PR, Her2/neu (confirmed on review but please note sample size is extremely small).   02/15/2022 - 03/08/2022 Chemotherapy    Taxotere/Cytoxan x 2, discontinued due to progression of breast.   03/30/2022 Mammogram   Mammogram and ultrasound show numerous masses throughout left breast not visualized in 01/2022, +left axillary lymphadenopathy, skin thickening in right breast and in upper abdomen just below sternum   04/05/2022 -  Chemotherapy   Patient is on Treatment Plan : BREAST METASTATIC Fam-Trastuzumab Deruxtecan-nxki (Enhertu) (5.4) q21d     04/06/2022 - 04/26/2022 Chemotherapy   Patient is on Treatment Plan : BREAST METASTATIC fam-trastuzumab deruxtecan-nxki (Enhertu) q21d      CURRENT THERAPY: progression on Taxotere and cytoxan  INTERVAL HISTORY:  [name] 46 y.o.  female returns for follow-Thomas of her breast cancer.   She is currently planned for surgery on August 22, 2022. She is here for an unplanned visit with the spanish interpretor. She says the left breast is more erythematous, left forearm is erythematous and swollen and she reports feeling very fatigued. She complains of some pain radiating down her right leg.  She continues to reports some mild intermittent headaches. 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[place]) 04/01/2022   Encounter for dental examination 03/11/2022   Acute apical periodontitis 03/11/2022   Phobia of dental procedure 03/11/2022   Defective dental restoration 03/11/2022   Accretions on teeth 03/11/2022   Chronic periodontitis 03/11/2022   Teeth missing 03/11/2022   Caries 03/11/2022   Generalized gingival recession 03/11/2022   Pain, dental 03/08/2022   Cancer-related pain 03/08/2022   Port-A-Cath in place 02/15/2022   Oral allergy syndrome, subsequent encounter 02/03/2022   Adverse effect of other drugs, medicaments and biological substances, subsequent encounter 02/02/2022   Other adverse food reactions, not elsewhere classified, subsequent encounter 02/02/2022   Chest tightness 02/02/2022  Other allergic rhinitis 02/02/2022   Allergic conjunctivitis of both eyes 02/02/2022   Language barrier 12/11/2020   Morbid obesity ([place])    History of breast cancer    Genetic testing 02/13/2020   Malignant neoplasm of upper-outer quadrant of left breast in female, estrogen receptor positive ([place]) 01/27/2020   Impaired ability to use community resources due to language barrier 07/13/2013    is allergic to omnipaque [iohexol], doxorubicin hcl, keytruda [pembrolizumab], other, and tape.  MEDICAL HISTORY: Past Medical History:  Diagnosis  Date   Allergy    Asthma    Breast cancer in female [facility])    Left   Breast disorder    breast cancer May 2021   Chronic back pain    History of breast cancer    History of COVID-19 04/20/2021   Hx of migraines    Morbid obesity ([place])    Personal history of chemotherapy    Personal history of radiation therapy     SURGICAL HISTORY: Past Surgical History:  Procedure Laterality Date   BREAST LUMPECTOMY     BREAST LUMPECTOMY WITH RADIOACTIVE SEED AND SENTINEL LYMPH NODE BIOPSY Left 07/15/2020   Procedure: LEFT BREAST LUMPECTOMY WITH RADIOACTIVE SEED AND LEFT AXILLARY SENTINEL LYMPH NODE BIOPSY, LEFT AXILLARY NODE RADIOACTIVE SEED GUIDED EXCISION, LEFT BREAST RADIOACTIVE SEED GUIDED EXCISION IM NODE;  Surgeon: Rolm Bookbinder, MD;  Location: [facility];  Service: General;  Laterality: Left;  PEC BLOCK   CESAREAN SECTION WITH BILATERAL TUBAL LIGATION Bilateral 07/23/2015   Procedure: CESAREAN SECTION WITH BILATERAL TUBAL LIGATION;  Surgeon: Donnamae Jude, MD;  Location: [facility];  Service: Obstetrics;  Laterality: Bilateral;   IR IMAGING GUIDED PORT INSERTION  02/11/2022   PORT-A-CATH REMOVAL N/A 04/30/2021   Procedure: REMOVAL PORT-A-CATH;  Surgeon: Rolm Bookbinder, MD;  Location: [facility];  Service: General;  Laterality: N/A;   PORTACATH PLACEMENT Right 02/11/2020   Procedure: INSERTION PORT-A-CATH WITH ULTRASOUND GUIDANCE;  Surgeon: Rolm Bookbinder, MD;  Location: [facility];  Service: General;  Laterality: Right;   TUBAL LIGATION      SOCIAL HISTORY: Social History   Socioeconomic History   Marital status: Legally Separated    Spouse name: Not on file   Number of children: 3   Years of education: Not on file   Highest education level: 9th grade  Occupational History   Occupation: Architect  Tobacco Use   Smoking status: Never   Smokeless tobacco: Never  Vaping Use   Vaping Use: Never used  Substance and Sexual Activity   Alcohol use:  No   Drug use: Never   Sexual activity: Yes    Birth control/protection: Surgical  Other Topics Concern   Not on file  Social History Narrative   Not on file   Social Determinants of Health   Financial Resource Strain: Not on file  Food Insecurity: No Food Insecurity (03/29/2022)   Hunger Vital Sign    Worried About Running Out of Food in the Last Year: Never true    Ran Out of Food in the Last Year: Never true  Transportation Needs: No Transportation Needs (03/29/2022)   PRAPARE - Hydrologist (Medical): No    Lack of Transportation (Non-Medical): No  Physical Activity: Not on file  Stress: Not on file  Social Connections: Not on file  Intimate Partner Violence: Not on file    FAMILY HISTORY: Family History  Problem Relation Age of Onset   Heart disease Maternal  Grandmother     Review of Systems  Constitutional:  Positive for fatigue. Negative for appetite change, chills, fever and unexpected weight change.  HENT:   Negative for hearing loss, lump/mass and trouble swallowing.   Eyes:  Negative for eye problems and icterus.  Respiratory:  Negative for chest tightness, cough and shortness of breath.   Cardiovascular:  Negative for chest pain, leg swelling and palpitations.  Gastrointestinal:  Negative for abdominal distention, abdominal pain, constipation, diarrhea, nausea and vomiting.  Endocrine: Negative for hot flashes.  Genitourinary:  Negative for difficulty urinating.   Musculoskeletal:  Negative for arthralgias.  Skin:  Positive for rash. Negative for itching.  Neurological:  Positive for headaches. Negative for dizziness, extremity weakness and numbness.  Hematological:  Negative for adenopathy. Does not bruise/bleed easily.  Psychiatric/Behavioral:  Negative for depression. The patient is not nervous/anxious.       PHYSICAL EXAMINATION  ECOG PERFORMANCE STATUS: 1 - Symptomatic but completely ambulatory  There were no vitals filed  for this visit.  Physical Exam Constitutional:      Appearance: Normal appearance.  Pulmonary:     Effort: Pulmonary effort is normal.     Breath sounds: Normal breath sounds.  Chest:       Comments: Once again no significant change.  Small area of erythema more like resolving erythema noted in the left breast,  cancer. Rest of the breast appears about the same to me. Left forearm is swollen, well demarcated with some tenderness and swelling. Posterior chest wall erythema hasn't changed in many months. Abdominal:     General: Abdomen is flat.     Palpations: Abdomen is soft.  Musculoskeletal:        General: No swelling. Normal range of motion.     Cervical back: Normal range of motion and neck supple. No rigidity.  Lymphadenopathy:     Cervical: No cervical adenopathy.  Neurological:     Mental Status: She is alert.      LABORATORY DATA:  None today  RADIOGRAPHIC STUDIES:  No results found.    ASSESSMENT and THERAPY PLAN:   Malignant neoplasm of upper-outer quadrant of left breast in female, estrogen receptor positive ([name]) [name] is a 46 year old Spanish-speaking woman who has a 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  She was most recently seen for ongoing breast redness, given a course of antibiotics but had persistent redness hence we proceeded with a punch biopsy.  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  Pathology was sent to [facility] for second opinion as well. This confirmed triple negative breast cancer.  She progressed on after 2 cycles of TC hence we have switched her treatment to Enhertu.   Repeat biopsy demonstrated her HER2 to be 1+. After her first infusion, she came back to the clinic with increasing redness history of treated empirically for cellulitis, gave her a dose of IV vancomycin, oral doxycycline She is now here before planned cycle  4 of Enhertu.  She has had robust response so far with decreased erythema, induration and edema of the left breast, medial portion of the right breast as well as midline. She is now here after 6 cycles of Enhertu, for an unplanned visit, she is worried about this new erythema and swelling of the left forearm, small patch on the left breast, some vague sense of ill being and ongoing skin erythema of the posterior skin. I am  really not sure if the posterior erythema is any better with treatment and I am not really sure if this is even related to cancer since it has not shown any change.  Then, Left forearm swelling is likely related to an infection, will try doxycycline 100 mg PO BID for 7 days. I will discuss with Dr Donne Hazel about this patient as well.  #Headaches, MR brain neg. She is overall very anxious and needs lot of reassurance.  # Fatigue, likely from recent treatment and ongoing cancer.  I spent 40 minutes in the care of this patient.    All questions were answered. The patient knows to call the clinic with any problems, questions or concerns. We can certainly see the patient much sooner if necessary.  Total time spent: 40 minutes.  Certified Spanish interpreter was used for the entirety of this conversation  *Total Encounter Time as defined by the Centers for Medicare and Medicaid Services includes, in addition to the face-to-face time of a patient visit (documented in the note above) non-face-to-face time: obtaining and reviewing outside history, ordering and reviewing medications, tests or procedures, care coordination (communications with other health care professionals or caregivers) and documentation in the medical record.

## 2022-08-17 NOTE — Assessment & Plan Note (Signed)
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  She was most recently seen for ongoing breast redness, given a course of antibiotics but had persistent redness hence we proceeded with a punch biopsy.  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  Pathology was sent to [facility] for second opinion as well. This confirmed triple negative breast cancer.  She progressed on after 2 cycles of TC hence we have switched her treatment to Enhertu.   Repeat biopsy demonstrated her HER2 to be 1+. After her first infusion, she came back to the clinic with increasing redness history of treated empirically for cellulitis, gave her a dose of IV vancomycin, oral doxycycline She is now here before planned cycle 4 of Enhertu.  She has had robust response so far with decreased erythema, induration and edema of the left breast, medial portion of the right breast as well as midline. She is now here after 6 cycles of Enhertu, for an unplanned visit, she is worried about this new erythema and swelling of the left forearm, small patch on the left breast, some vague sense of ill being and ongoing skin erythema of the posterior skin. I am really not sure if the posterior erythema is any better with treatment and I am not really sure if this is even related to cancer since it has not shown any change.  Then, Left forearm swelling is likely related to an infection, will try doxycycline 100 mg PO BID for 7 days. I will discuss with Dr Donne Hazel about this patient as well.  #Headaches, MR brain neg. She is overall very anxious and needs lot of reassurance.  # Fatigue, likely from recent treatment and ongoing cancer.  I spent 40 minutes in the care of this patient.

## 2022-08-18 ENCOUNTER — Encounter (HOSPITAL_COMMUNITY)
Admission: RE | Admit: 2022-08-18 | Discharge: 2022-08-18 | Disposition: A | Discharge status: Home / Self Care | Payer: No Typology Code available for payment source | Source: Ambulatory Visit | Attending: General Surgery | Admitting: General Surgery

## 2022-08-18 ENCOUNTER — Encounter (HOSPITAL_COMMUNITY): Payer: Self-pay

## 2022-08-18 ENCOUNTER — Other Ambulatory Visit: Payer: Self-pay

## 2022-08-18 VITALS — BP 120/85 | HR 80 | Temp 98.3°F | Resp 16 | Ht 62.0 in | Wt 184.0 lb

## 2022-08-18 DIAGNOSIS — Z01812 Encounter for preprocedural laboratory examination: Secondary | ICD-10-CM | POA: Insufficient documentation

## 2022-08-18 DIAGNOSIS — Z01818 Encounter for other preprocedural examination: Secondary | ICD-10-CM

## 2022-08-18 HISTORY — DX: Headache, unspecified: R51.9

## 2022-08-18 LAB — CBC
HCT: 38.7 % (ref 36.0–46.0)
Hemoglobin: 12.5 g/dL (ref 12.0–15.0)
MCH: 28.8 pg (ref 26.0–34.0)
MCHC: 32.3 g/dL (ref 30.0–36.0)
MCV: 89.2 fL (ref 80.0–100.0)
Platelets: 247 10*3/uL (ref 150–400)
RBC: 4.34 MIL/uL (ref 3.87–5.11)
RDW: 15.8 % — ABNORMAL HIGH (ref 11.5–15.5)
WBC: 4.7 10*3/uL (ref 4.0–10.5)
nRBC: 0 % (ref 0.0–0.2)

## 2022-08-19 ENCOUNTER — Other Ambulatory Visit: Payer: Self-pay

## 2022-08-21 NOTE — Anesthesia Preprocedure Evaluation (Signed)
Anesthesia Evaluation  Patient identified by MRN, date of birth, ID band Patient awake    Reviewed: Allergy & Precautions, NPO status , Patient's Chart, lab work & pertinent test results  History of Anesthesia Complications Negative for: history of anesthetic complications  Airway Mallampati: II  TM Distance: >3 FB Neck ROM: Full    Dental  (+) Dental Advisory Given, Teeth Intact   Pulmonary asthma , COPD,  COPD inhaler   breath sounds clear to auscultation       Cardiovascular  Rhythm:Regular Rate:Normal  06/2022 ECHo: EF 55-60%, normal LVF, normal RVF, no significant valvular abnormalities   Neuro/Psych  Headaches  Anxiety        GI/Hepatic Neg liver ROS,GERD  Medicated and Controlled,,  Endo/Other  BMI 33.6  Renal/GU negative Renal ROS     Musculoskeletal   Abdominal  (+) + obese  Peds  Hematology negative hematology ROS (+)   Anesthesia Other Findings Breast cancer  Reproductive/Obstetrics                              Anesthesia Physical Anesthesia Plan  ASA: 3  Anesthesia Plan: General   Post-op Pain Management: Tylenol PO (pre-op)* and Regional block*   Induction: Intravenous  PONV Risk Score and Plan: 3 and Ondansetron, Dexamethasone and Scopolamine patch - Pre-op  Airway Management Planned: Oral ETT  Additional Equipment: None  Intra-op Plan:   Post-operative Plan: Extubation in OR  Informed Consent: I have reviewed the patients History and Physical, chart, labs and discussed the procedure including the risks, benefits and alternatives for the proposed anesthesia with the patient or authorized representative who has indicated his/her understanding and acceptance.     Dental advisory given  Plan Discussed with: CRNA and Surgeon  Anesthesia Plan Comments: (Plan routine monitors, GETA with pectoralis block for post op analgesia)        Anesthesia Quick  Evaluation

## 2022-08-22 ENCOUNTER — Ambulatory Visit (HOSPITAL_COMMUNITY): Payer: No Typology Code available for payment source | Admitting: Anesthesiology

## 2022-08-22 ENCOUNTER — Observation Stay (HOSPITAL_COMMUNITY)
Admission: RE | Admit: 2022-08-22 | Discharge: 2022-08-23 | Disposition: A | Discharge status: Home / Self Care | Payer: No Typology Code available for payment source | Attending: General Surgery | Admitting: General Surgery

## 2022-08-22 ENCOUNTER — Ambulatory Visit (HOSPITAL_BASED_OUTPATIENT_CLINIC_OR_DEPARTMENT_OTHER): Payer: No Typology Code available for payment source | Admitting: Anesthesiology

## 2022-08-22 ENCOUNTER — Encounter (HOSPITAL_COMMUNITY): Payer: Self-pay | Admitting: General Surgery

## 2022-08-22 ENCOUNTER — Other Ambulatory Visit: Payer: Self-pay

## 2022-08-22 ENCOUNTER — Ambulatory Visit: Payer: No Typology Code available for payment source

## 2022-08-22 ENCOUNTER — Encounter (HOSPITAL_COMMUNITY)
Admission: RE | Disposition: A | Discharge status: Home / Self Care | Payer: Self-pay | Source: Home / Self Care | Attending: General Surgery

## 2022-08-22 DIAGNOSIS — E669 Obesity, unspecified: Secondary | ICD-10-CM

## 2022-08-22 DIAGNOSIS — Z6833 Body mass index (BMI) 33.0-33.9, adult: Secondary | ICD-10-CM

## 2022-08-22 DIAGNOSIS — Z170 Estrogen receptor positive status [ER+]: Secondary | ICD-10-CM | POA: Insufficient documentation

## 2022-08-22 DIAGNOSIS — C50912 Malignant neoplasm of unspecified site of left female breast: Secondary | ICD-10-CM

## 2022-08-22 DIAGNOSIS — Z9012 Acquired absence of left breast and nipple: Secondary | ICD-10-CM

## 2022-08-22 DIAGNOSIS — C50412 Malignant neoplasm of upper-outer quadrant of left female breast: Principal | ICD-10-CM | POA: Insufficient documentation

## 2022-08-22 DIAGNOSIS — J449 Chronic obstructive pulmonary disease, unspecified: Secondary | ICD-10-CM

## 2022-08-22 DIAGNOSIS — F419 Anxiety disorder, unspecified: Secondary | ICD-10-CM

## 2022-08-22 HISTORY — PX: MODIFIED MASTECTOMY: SHX5268

## 2022-08-22 SURGERY — MODIFIED MASTECTOMY
Anesthesia: General | Site: Breast | Laterality: Left

## 2022-08-22 MED ORDER — PROPOFOL 10 MG/ML IV BOLUS
INTRAVENOUS | Status: AC
  Filled 2022-08-22: qty 20

## 2022-08-22 MED ORDER — PHENYLEPHRINE 80 MCG/ML (10ML) SYRINGE FOR IV PUSH (FOR BLOOD PRESSURE SUPPORT)
PREFILLED_SYRINGE | INTRAVENOUS | Status: AC
  Filled 2022-08-22: qty 10

## 2022-08-22 MED ORDER — DEXAMETHASONE SODIUM PHOSPHATE 10 MG/ML IJ SOLN
INTRAMUSCULAR | Status: DC | PRN
  Administered 2022-08-22: 10 mg via INTRAVENOUS

## 2022-08-22 MED ORDER — PROPOFOL 10 MG/ML IV BOLUS
INTRAVENOUS | Status: DC | PRN
  Administered 2022-08-22: 200 mg via INTRAVENOUS

## 2022-08-22 MED ORDER — DEXAMETHASONE SODIUM PHOSPHATE 10 MG/ML IJ SOLN
INTRAMUSCULAR | Status: AC
  Filled 2022-08-22: qty 1

## 2022-08-22 MED ORDER — ACETAMINOPHEN 500 MG PO TABS: 1000.0000 mg | ORAL_TABLET | Freq: Once | ORAL | Status: DC

## 2022-08-22 MED ORDER — OXYCODONE HCL 5 MG/5ML PO SOLN: 5.0000 mg | Freq: Once | ORAL | Status: DC | PRN

## 2022-08-22 MED ORDER — ROCURONIUM BROMIDE 10 MG/ML (PF) SYRINGE
PREFILLED_SYRINGE | INTRAVENOUS | Status: DC | PRN
  Administered 2022-08-22: 60 mg via INTRAVENOUS

## 2022-08-22 MED ORDER — LIDOCAINE 2% (20 MG/ML) 5 ML SYRINGE
INTRAMUSCULAR | Status: DC | PRN
  Administered 2022-08-22: 40 mg via INTRAVENOUS

## 2022-08-22 MED ORDER — OXYCODONE HCL 5 MG PO TABS: 5.0000 mg | ORAL_TABLET | Freq: Once | ORAL | Status: DC | PRN

## 2022-08-22 MED ORDER — HYDROMORPHONE HCL 1 MG/ML IJ SOLN
0.2500 mg | INTRAMUSCULAR | Status: DC | PRN
  Administered 2022-08-22: 0.5 mg via INTRAVENOUS

## 2022-08-22 MED ORDER — ONDANSETRON HCL 4 MG/2ML IJ SOLN: 4.0000 mg | Freq: Four times a day (QID) | INTRAMUSCULAR | Status: DC | PRN

## 2022-08-22 MED ORDER — PROMETHAZINE HCL 25 MG/ML IJ SOLN
6.2500 mg | INTRAMUSCULAR | Status: DC | PRN
  Administered 2022-08-22: 6.25 mg via INTRAVENOUS

## 2022-08-22 MED ORDER — BUPIVACAINE-EPINEPHRINE (PF) 0.5% -1:200000 IJ SOLN
INTRAMUSCULAR | Status: DC | PRN
  Administered 2022-08-22: 30 mL

## 2022-08-22 MED ORDER — ONDANSETRON HCL 4 MG/2ML IJ SOLN
INTRAMUSCULAR | Status: DC | PRN
  Administered 2022-08-22: 4 mg via INTRAVENOUS

## 2022-08-22 MED ORDER — SCOPOLAMINE 1 MG/3DAYS TD PT72
MEDICATED_PATCH | TRANSDERMAL | Status: DC | PRN
  Administered 2022-08-22: 1 via TRANSDERMAL

## 2022-08-22 MED ORDER — ENSURE PRE-SURGERY PO LIQD: 296.0000 mL | Freq: Once | ORAL | Status: DC

## 2022-08-22 MED ORDER — METHOCARBAMOL 500 MG PO TABS: 500.0000 mg | ORAL_TABLET | Freq: Three times a day (TID) | ORAL | Status: DC | PRN

## 2022-08-22 MED ORDER — FENTANYL CITRATE (PF) 250 MCG/5ML IJ SOLN
INTRAMUSCULAR | Status: DC | PRN
  Administered 2022-08-22 (×2): 50 ug via INTRAVENOUS
  Administered 2022-08-22: 100 ug via INTRAVENOUS
  Administered 2022-08-22: 50 ug via INTRAVENOUS

## 2022-08-22 MED ORDER — LACTATED RINGERS IV SOLN: INTRAVENOUS | Status: DC

## 2022-08-22 MED ORDER — ACETAMINOPHEN 500 MG PO TABS
1000.0000 mg | ORAL_TABLET | Freq: Four times a day (QID) | ORAL | Status: DC
  Administered 2022-08-22 – 2022-08-23 (×3): 1000 mg via ORAL
  Filled 2022-08-22 (×3): qty 2

## 2022-08-22 MED ORDER — MEPERIDINE HCL 50 MG/ML IJ SOLN: 6.2500 mg | INTRAMUSCULAR | Status: DC | PRN

## 2022-08-22 MED ORDER — ENOXAPARIN SODIUM 40 MG/0.4ML IJ SOSY: 40.0000 mg | PREFILLED_SYRINGE | INTRAMUSCULAR | Status: DC

## 2022-08-22 MED ORDER — ACETAMINOPHEN 500 MG PO TABS
1000.0000 mg | ORAL_TABLET | ORAL | Status: AC
  Administered 2022-08-22: 1000 mg via ORAL
  Filled 2022-08-22: qty 2

## 2022-08-22 MED ORDER — PROMETHAZINE HCL 25 MG/ML IJ SOLN
INTRAMUSCULAR | Status: AC
  Filled 2022-08-22: qty 1

## 2022-08-22 MED ORDER — PHENYLEPHRINE 80 MCG/ML (10ML) SYRINGE FOR IV PUSH (FOR BLOOD PRESSURE SUPPORT)
PREFILLED_SYRINGE | INTRAVENOUS | Status: DC | PRN
  Administered 2022-08-22 (×3): 80 ug via INTRAVENOUS

## 2022-08-22 MED ORDER — HYDROMORPHONE HCL 1 MG/ML IJ SOLN
INTRAMUSCULAR | Status: AC
  Filled 2022-08-22: qty 1

## 2022-08-22 MED ORDER — SODIUM CHLORIDE 0.9 % IV SOLN: INTRAVENOUS | Status: DC

## 2022-08-22 MED ORDER — ORAL CARE MOUTH RINSE: 15.0000 mL | Freq: Once | OROMUCOSAL | Status: AC

## 2022-08-22 MED ORDER — POLYETHYLENE GLYCOL 3350 17 G PO PACK: 17.0000 g | PACK | Freq: Every day | ORAL | Status: DC | PRN

## 2022-08-22 MED ORDER — 0.9 % SODIUM CHLORIDE (POUR BTL) OPTIME
TOPICAL | Status: DC | PRN
  Administered 2022-08-22: 1000 mL

## 2022-08-22 MED ORDER — CEFAZOLIN SODIUM-DEXTROSE 2-4 GM/100ML-% IV SOLN
2.0000 g | INTRAVENOUS | Status: AC
  Administered 2022-08-22: 2 g via INTRAVENOUS
  Filled 2022-08-22: qty 100

## 2022-08-22 MED ORDER — CHLORHEXIDINE GLUCONATE CLOTH 2 % EX PADS: 6.0000 | MEDICATED_PAD | Freq: Once | CUTANEOUS | Status: DC

## 2022-08-22 MED ORDER — OXYCODONE HCL 5 MG PO TABS
5.0000 mg | ORAL_TABLET | ORAL | Status: DC | PRN
  Administered 2022-08-22 – 2022-08-23 (×2): 10 mg via ORAL
  Filled 2022-08-22 (×2): qty 2

## 2022-08-22 MED ORDER — ROCURONIUM BROMIDE 10 MG/ML (PF) SYRINGE
PREFILLED_SYRINGE | INTRAVENOUS | Status: AC
  Filled 2022-08-22: qty 10

## 2022-08-22 MED ORDER — MIDAZOLAM HCL 5 MG/5ML IJ SOLN
INTRAMUSCULAR | Status: DC | PRN
  Administered 2022-08-22: 2 mg via INTRAVENOUS

## 2022-08-22 MED ORDER — SUGAMMADEX SODIUM 200 MG/2ML IV SOLN
INTRAVENOUS | Status: DC | PRN
  Administered 2022-08-22: 200 mg via INTRAVENOUS

## 2022-08-22 MED ORDER — ONDANSETRON HCL 4 MG/2ML IJ SOLN
INTRAMUSCULAR | Status: AC
  Filled 2022-08-22: qty 2

## 2022-08-22 MED ORDER — SIMETHICONE 80 MG PO CHEW: 40.0000 mg | CHEWABLE_TABLET | Freq: Four times a day (QID) | ORAL | Status: DC | PRN

## 2022-08-22 MED ORDER — FENTANYL CITRATE (PF) 250 MCG/5ML IJ SOLN
INTRAMUSCULAR | Status: AC
  Filled 2022-08-22: qty 5

## 2022-08-22 MED ORDER — MIDAZOLAM HCL 2 MG/2ML IJ SOLN
INTRAMUSCULAR | Status: AC
  Filled 2022-08-22: qty 2

## 2022-08-22 MED ORDER — HEMOSTATIC AGENTS (NO CHARGE) OPTIME
TOPICAL | Status: DC | PRN
  Administered 2022-08-22: 4

## 2022-08-22 MED ORDER — LIDOCAINE HCL (PF) 2 % IJ SOLN
INTRAMUSCULAR | Status: AC
  Filled 2022-08-22: qty 5

## 2022-08-22 MED ORDER — METHOCARBAMOL 1000 MG/10ML IJ SOLN: 500.0000 mg | Freq: Three times a day (TID) | INTRAVENOUS | Status: DC | PRN

## 2022-08-22 MED ORDER — SCOPOLAMINE 1 MG/3DAYS TD PT72
MEDICATED_PATCH | TRANSDERMAL | Status: AC
  Filled 2022-08-22: qty 1

## 2022-08-22 MED ORDER — MIDAZOLAM HCL 2 MG/2ML IJ SOLN: 0.5000 mg | Freq: Once | INTRAMUSCULAR | Status: DC | PRN

## 2022-08-22 MED ORDER — MORPHINE SULFATE (PF) 2 MG/ML IV SOLN: 1.0000 mg | INTRAVENOUS | Status: DC | PRN

## 2022-08-22 MED ORDER — CHLORHEXIDINE GLUCONATE 0.12 % MT SOLN
15.0000 mL | Freq: Once | OROMUCOSAL | Status: AC
  Administered 2022-08-22: 15 mL via OROMUCOSAL

## 2022-08-22 MED ORDER — ONDANSETRON 4 MG PO TBDP: 4.0000 mg | ORAL_TABLET | Freq: Four times a day (QID) | ORAL | Status: DC | PRN

## 2022-08-22 SURGICAL SUPPLY — 54 items
ADH SKN CLS APL DERMABOND .7 (GAUZE/BANDAGES/DRESSINGS) ×2
APL PRP STRL LF DISP 70% ISPRP (MISCELLANEOUS) ×1
APL SKNCLS STERI-STRIP NONHPOA (GAUZE/BANDAGES/DRESSINGS)
APPLIER CLIP 11 MED OPEN (CLIP) ×3
APR CLP MED 11 20 MLT OPN (CLIP) ×3
BAG COUNTER SPONGE SURGICOUNT (BAG) IMPLANT
BAG SPNG CNTER NS LX DISP (BAG)
BENZOIN TINCTURE PRP APPL 2/3 (GAUZE/BANDAGES/DRESSINGS) IMPLANT
BINDER BREAST LRG (GAUZE/BANDAGES/DRESSINGS) IMPLANT
BINDER BREAST XLRG (GAUZE/BANDAGES/DRESSINGS) IMPLANT
BLADE HEX COATED 2.75 (ELECTRODE) ×1 IMPLANT
BNDG ADH 1X3 SHEER STRL LF (GAUZE/BANDAGES/DRESSINGS) IMPLANT
BNDG ADH THN 3X1 STRL LF (GAUZE/BANDAGES/DRESSINGS) ×1
BNDG CMPR 5X4 CHSV STRCH STRL (GAUZE/BANDAGES/DRESSINGS)
BNDG COHESIVE 4X5 TAN STRL LF (GAUZE/BANDAGES/DRESSINGS) IMPLANT
CHLORAPREP W/TINT 26 (MISCELLANEOUS) ×1 IMPLANT
CLIP APPLIE 11 MED OPEN (CLIP) ×1 IMPLANT
COVER MAYO STAND STRL (DRAPES) IMPLANT
DERMABOND ADVANCED .7 DNX12 (GAUZE/BANDAGES/DRESSINGS) ×2 IMPLANT
DRAIN CHANNEL 19F RND (DRAIN) ×2 IMPLANT
DRAPE LAPAROSCOPIC ABDOMINAL (DRAPES) IMPLANT
DRAPE ORTHO SPLIT 77X108 STRL (DRAPES)
DRAPE SHEET LG 3/4 BI-LAMINATE (DRAPES) IMPLANT
DRAPE SURG ORHT 6 SPLT 77X108 (DRAPES) IMPLANT
DRAPE UTILITY XL STRL (DRAPES) ×2 IMPLANT
DRSG TEGADERM 4X4.75 (GAUZE/BANDAGES/DRESSINGS) IMPLANT
ELECT REM PT RETURN 15FT ADLT (MISCELLANEOUS) ×1 IMPLANT
EVACUATOR SILICONE 100CC (DRAIN) ×2 IMPLANT
GAUZE PAD ABD 8X10 STRL (GAUZE/BANDAGES/DRESSINGS) IMPLANT
GAUZE SPONGE 4X4 12PLY STRL (GAUZE/BANDAGES/DRESSINGS) IMPLANT
GLOVE BIO SURGEON STRL SZ7 (GLOVE) ×1 IMPLANT
GLOVE BIOGEL PI IND STRL 7.5 (GLOVE) ×1 IMPLANT
GOWN STRL REUS W/ TWL LRG LVL3 (GOWN DISPOSABLE) ×1 IMPLANT
GOWN STRL REUS W/TWL LRG LVL3 (GOWN DISPOSABLE) ×1
HEMOSTAT ARISTA ABSORB 3G PWDR (HEMOSTASIS) IMPLANT
KIT BASIN OR (CUSTOM PROCEDURE TRAY) ×1 IMPLANT
KIT TURNOVER KIT A (KITS) IMPLANT
PACK GENERAL/GYN (CUSTOM PROCEDURE TRAY) ×1 IMPLANT
SPONGE DRAIN TRACH 4X4 STRL 2S (GAUZE/BANDAGES/DRESSINGS) IMPLANT
STAPLER VISISTAT 35W (STAPLE) ×1 IMPLANT
STOCKINETTE 6  STRL (DRAPES)
STOCKINETTE 6 STRL (DRAPES) IMPLANT
STOCKINETTE 8 INCH (MISCELLANEOUS) IMPLANT
STRIP CLOSURE SKIN 1/2X4 (GAUZE/BANDAGES/DRESSINGS) IMPLANT
SUT ETHILON 2 0 PS N (SUTURE) IMPLANT
SUT MNCRL AB 4-0 PS2 18 (SUTURE) IMPLANT
SUT MON AB 5-0 PS2 18 (SUTURE) ×2 IMPLANT
SUT SILK 2 0 SH (SUTURE) IMPLANT
SUT VIC AB 2-0 BRD 54 (SUTURE) IMPLANT
SUT VIC AB 3-0 SH 8-18 (SUTURE) ×2 IMPLANT
TOWEL OR 17X26 10 PK STRL BLUE (TOWEL DISPOSABLE) ×1 IMPLANT
TOWEL OR NON WOVEN STRL DISP B (DISPOSABLE) ×1 IMPLANT
TRAY FOLEY MTR SLVR 14FR STAT (SET/KITS/TRAYS/PACK) IMPLANT
TRAY FOLEY MTR SLVR 16FR STAT (SET/KITS/TRAYS/PACK) IMPLANT

## 2022-08-22 NOTE — Anesthesia Postprocedure Evaluation (Signed)
Anesthesia Post Note  Patient: [name]  Procedure(s) Performed: LEFT MODIFIED RADICAL MASTECTOMY, LEFT AXILLARY NODE DISSECTION, AND SKIN BIOPSY OF BACK (Left: Breast)     Patient location during evaluation: PACU Anesthesia Type: General Level of consciousness: patient cooperative, oriented and sedated Pain management: pain level controlled Vital Signs Assessment: post-procedure vital signs reviewed and stable Respiratory status: spontaneous breathing, nonlabored ventilation and respiratory function stable Cardiovascular status: blood pressure returned to baseline and stable Postop Assessment: no apparent nausea or vomiting Anesthetic complications: no   No notable events documented.  Last Vitals:  Vitals:   08/22/22 1015 08/22/22 1030  BP: 116/76 110/76  Pulse: 88 86  Resp: (!) 21 14  Temp:    SpO2: 98% 96%    Last Pain:  Vitals:   08/22/22 1015  TempSrc:   PainSc: 6                  Cloys Vera,E. Kierstan Auer

## 2022-08-22 NOTE — H&P (Signed)
46 y.o. female who I know well from her breast cancer therapy in 2021. 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 She underwent neoadjuvant chemotherapy. She then underwent lumpectomy and targeted node dissection for a residual ypT1b ypN0 cancer. Repeat prognostic panel showed this to be triple negative. She then underwent adjuvant radiotherapy she did not tolerate the capecitabine. Her genetic testing was negative. She then did adjuvant pembrolizumab which was discontinued due to side effects. She then began to have breast erythema for which I saw her in May. The breast became heavier and more red several months ago. She was completed antibiotics and this did not get any better. She was evaluated by oncology and sent for mammogram and ultrasound as well as to see me to consider a biopsy. This showed interval development of diffuse skin thickening overlying the left breast. There is no definite discrete mass. The right breast is normal. Ultrasound shows no discrete mass just some extensive thick thinning of the skin. I did a punch biopsy of her skin and this was c/w ibc. She has been treated with chemo since then but has been worsening to her. I had her get repeat mm/us. She now has numerous masses in the left breast centered in uoq. She has 2 abnormal nodes in the left axilla. She also has some thickening of right breast medial skin (this is apparent on exam also) as well as possible right ax node. she then started on enhertu. This has all improved since then. She has no distant disease. She has repeat imaging that shows all of the four masses are smaller. She has no more skin redness. She has borderline nodes now. she will finish enhertu 11/8 and is here to discuss surgery   Medical History: Past Medical History: Diagnosis Date History of cancer  Patient Active Problem List Diagnosis Malignant neoplasm of upper-outer  quadrant of left breast in female, estrogen receptor positive  Past Surgical History: Procedure Laterality Date MASTECTOMY PARTIAL / LUMPECTOMY Left 07/15/2020 DEEP AXILLARY SENTINEL NODE BIOPSY / EXCISION Left tad   Allergies Allergen Reactions Iohexol Other (See Comments) Throat itching, some SOB and some chest pain post administration Doxorubicin Hcl Rash Experience chest tightness, abdominal pain, and redness around lips during and shortly after doxorubicin bolus. Other Itching Almonds, cherries, peaches Pembrolizumab Other (See Comments) Lung irritation; skin breakout; swelling  No current outpatient medications on file prior to visit.  History reviewed. No pertinent family history.  Social History  Tobacco Use Smoking Status Never Smokeless Tobacco Never Marital status: Legally Separated Tobacco Use Smoking status: Never Smokeless tobacco: Never Substance and Sexual Activity Alcohol use: Not Currently Drug use: Not Currently  Objective:  Physical Exam  General nad Cv regular Pulm effort normal No redness present. She has a hard mass in the left breast is still somewhat fixed but much more mobile. There is no axillary or cervical adenopathy.  Assessment and Plan:  Malignant neoplasm of upper-outer quadrant of left breast in female, estrogen receptor positive  MRM  I think reasonable to attempt resection given her age and absent distant disease as well as response to chemotherapy. Due to some pain she is having I will send for repeat mm/us. We discussed at length I think only real option at this point given prior therapy and IBC is to proceed with an MRM. I told her I would not recommend reconstruction at this time either. We discussed surgery, drains, risks and postop plan.  Will plan to proceed early to mid December

## 2022-08-22 NOTE — Interval H&P Note (Signed)
History and Physical Interval Note:  08/22/2022 6:54 AM  [name]  has presented today for surgery, with the diagnosis of LEFT BREAST CANCER.  The various methods of treatment have been discussed with the patient and family. After consideration of risks, benefits and other options for treatment, the patient has consented to  Procedure(s): LEFT MODIFIED RADICAL MASTECTOMY (Left) as a surgical intervention.  The patient's history has been reviewed, patient examined, no change in status, stable for surgery.  I have reviewed the patient's chart and labs.  Questions were answered to the patient's satisfaction.     Rolm Bookbinder

## 2022-08-22 NOTE — Anesthesia Procedure Notes (Signed)
Anesthesia Regional Block: Pectoralis block   Pre-Anesthetic Checklist: , timeout performed,  Correct Patient, Correct Site, Correct Laterality,  Correct Procedure, Correct Position, site marked,  Risks and benefits discussed,  Surgical consent,  Pre-op evaluation,  At surgeon's request and post-op pain management  Laterality: Left  Prep: chloraprep       Needles:  Injection technique: Single-shot  Needle Type: Echogenic Needle     Needle Length: 9cm  Needle Gauge: 21     Additional Needles:   Procedures:,,,, ultrasound used (permanent image in chart),,    Narrative:  Start time: 08/22/2022 7:17 AM End time: 08/22/2022 7:23 AM Injection made incrementally with aspirations every 5 mL.  Performed by: Personally  Anesthesiologist: Annye Asa, MD  Additional Notes: Pt identified in Holding room.  Monitors applied. Working IV access confirmed. Sterile prep L clavicle and pec.  #21ga ECHOgenic Arrow block needle between pec major and pec minor, between ribs 3,4 with US guidance.  30cc 0.5% Bupivacaine 1:200k epi injected incrementally after negative test dose.  Patient asymptomatic, VSS, no heme aspirated, tolerated well.   Jenita Seashore, MD

## 2022-08-22 NOTE — Anesthesia Procedure Notes (Signed)
Procedure Name: Intubation Date/Time: 08/22/2022 7:40 AM  Performed by: Jenne Campus, CRNAPre-anesthesia Checklist: Patient identified, Emergency Drugs available, Suction available and Patient being monitored Patient Re-evaluated:Patient Re-evaluated prior to induction Oxygen Delivery Method: Circle System Utilized Preoxygenation: Pre-oxygenation with 100% oxygen Induction Type: IV induction Ventilation: Mask ventilation without difficulty Laryngoscope Size: Miller and 2 Grade View: Grade I Tube type: Oral Tube size: 7.0 mm Number of attempts: 1 Airway Equipment and Method: Stylet Placement Confirmation: ETT inserted through vocal cords under direct vision, positive ETCO2 and breath sounds checked- equal and bilateral Secured at: 21 cm Tube secured with: Tape Dental Injury: Teeth and Oropharynx as per pre-operative assessment

## 2022-08-22 NOTE — Transfer of Care (Signed)
Immediate Anesthesia Transfer of Care Note  Patient: [name]  Procedure(s) Performed: LEFT MODIFIED RADICAL MASTECTOMY, LEFT AXILLARY NODE DISSECTION, AND SKIN BIOPSY OF BACK (Left: Breast)  Patient Location: PACU  Anesthesia Type:General  Level of Consciousness: oriented, drowsy, and patient cooperative  Airway & Oxygen Therapy: Patient Spontanous Breathing and Patient connected to nasal cannula oxygen  Post-op Assessment: Report given to RN and Post -op Vital signs reviewed and stable  Post vital signs: Reviewed  Last Vitals:  Vitals Value Taken Time  BP 110/65 08/22/22 0941  Temp    Pulse 92 08/22/22 0944  Resp 14 08/22/22 0944  SpO2 100 % 08/22/22 0944  Vitals shown include unvalidated device data.  Last Pain:  Vitals:   08/22/22 0610  TempSrc: Oral  PainSc:          Complications: No notable events documented.

## 2022-08-22 NOTE — Op Note (Signed)
Preoperative diagnosis: Left inflammatory breast cancer, recurrent Postoperative diagnosis: Same as above Procedure: Left modified radical mastectomy, punch biopsy skin of back Surgeon: Dr. Serita Grammes Assistant: Carlena Hurl PA C Anesthesia: General with a pectoral block Estimated blood loss: 100 cc Drains: 19 French Blake drain to space Specimens: 1.  Left breast marked short superior, long lateral 2.  Left axillary lymph nodes Complications: None Sponge no counts correct completion Disposition recovery stable  Indications.This is a 46 year old female who in 2021 had a T3 N1 triple negative invasive ductal carcinoma.  She underwent neoadjuvant chemotherapy followed by lumpectomy and targeted node dissection for a residual ypT1b ypN0 cancer.  Repeat prognostic panel showed this to be triple negative.  She then underwent radiotherapy.  Her genetic testing at that time was negative.  She then did well but had interval development of diffuse skin thickening a couple years later.  I did a punch biopsy of her skin at that time and this was consistent with inflammatory breast cancer.  She has been treated with chemo since then.  This and she was going through chemo until she started Enhertu.  This eventually got better.  She has no distant disease on her imaging.  She had 4 masses in her breast that are all smaller in her axilla and now is borderline.  We discussed proceeding with a modified radical mastectomy at this point to try to achieve no evidence of disease.  Procedure: After informed consent was obtained again today with a Spanish interpreter she was taken to the operating room.  She was given antibiotics.  She underwent pectoral block with anesthesia.  She had SCDs in place.  She was placed under general esthesia without complication.  She was then rolled in the right lateral position.  Her back was prepped and draped in sterile sterile surgical fashion.  Surgical timeout was then  performed.  A 5 mm punch biopsy was performed on some redness in her back.  This was closed with a 3-0 nylon dressing placed.  She was then rolled supine.  She was again prepped and draped in the standard sterile surgical fashion.  Surgical timeout again was performed.  I made elliptical incision to remove as much of the skin as I can given the inflammatory nature of the tumor.  The nipple and areola were included in this incision.  I then carried flaps of the parasternal area, clavicle, inframammary fold.  I obliterated the inframammary fold to close this layer.  I took this to the latissimus.  This was difficult due to her prior surgery, prior radiotherapy as well as the tumor.  I then rolled the breast and the fascia off the pectoralis muscle and remove this and marked it as above.  Her axilla had a lot of scarring.  There was no real palpable adenopathy present.  I was able to find her axillary vein.  I was able to remove the tissue caudad to that taking care to preserve the nerves.  This was then passed off the table as a second specimen.  I obtained hemostasis.  I did place Arista in the cavity.  I placed a 56 [place] Blake drain and secured this with a 2-0 nylon suture.  This was then closed with a 3-0 Vicryl and the skin was tacked down to the chest wall.  4 Monocryl and glue were then used.  She tolerated this well was extubated transferred recovery stable.

## 2022-08-23 ENCOUNTER — Encounter (HOSPITAL_COMMUNITY): Payer: Self-pay | Admitting: General Surgery

## 2022-08-23 MED ORDER — METHOCARBAMOL 500 MG PO TABS: 500.0000 mg | ORAL_TABLET | Freq: Three times a day (TID) | ORAL | 0 refills | Status: DC | PRN

## 2022-08-23 MED ORDER — OXYCODONE HCL 5 MG PO TABS: 5.0000 mg | ORAL_TABLET | ORAL | 0 refills | Status: DC | PRN

## 2022-08-23 NOTE — TOC CM/SW Note (Signed)
Transition of Care [facility]) Screening Note  Patient Details  Name: [name] Date of Birth: Dec 03, 1975  Transition of Care [facility]) CM/SW Contact:    Sherie Don, LCSW Phone Number: 08/23/2022, 12:12 PM  Transition of Care Department [facility]) has reviewed patient and no TOC needs have been identified at this time. We will continue to monitor patient advancement through interdisciplinary progression rounds. If new patient transition needs arise, please place a TOC consult.

## 2022-08-23 NOTE — Discharge Instructions (Signed)
[facility] [phone]  MASTECTOMY: POST OP INSTRUCTIONS Take 400 mg of ibuprofen every 8 hours or 650 mg tylenol every 6 hours for next 72 hours then as needed. Use ice several times daily also. Always review your discharge instruction sheet given to you by the facility where your surgery was performed.  A prescription for pain medication may be given to you upon discharge.  Take your pain medication as prescribed, if needed.  If narcotic pain medicine is not needed, then you may take acetaminophen (Tylenol), naprosyn (Alleve) or ibuprofen (Advil) as needed. Take your usually prescribed medications unless otherwise directed. If you need a refill on your pain medication, please contact your pharmacy.  They will contact our office to request authorization.  Prescriptions will not be filled after 5pm or on week-ends. You should follow a light diet the first 24 hours after surgery.  Resume your normal diet the day after surgery. Most patients will experience some swelling and bruising on the chest and underarm.  Ice packs will help.  Swelling and bruising can take several days to resolve. Wear the binder or Prairie bra for 72 hours day and night. After night please wear during the day.  It is common to experience some constipation if taking pain medication after surgery.  Increasing fluid intake and taking a stool softener (such as Colace) will usually help or prevent this problem from occurring.  A mild laxative (Milk of Magnesia or Miralax) should be taken according to package instructions if there are no bowel movements after 48 hours. There is glue and steristrips on your incision. They will come off in the next few weeks.  You may take a shower 48 hours after surgery.  Any sutures will be removed at an office visit DRAINS:  If you have drains in place, it is important to keep a list of the amount of drainage produced each day in your drains.  Before leaving the hospital, you  should be instructed on drain care.  Call our office if you have any questions about your drains. I will remove your drains when they put out less than 30 cc or ml for 2 consecutive days. ACTIVITIES:  You may resume regular (light) daily activities beginning the next day--such as daily self-care, walking, climbing stairs--gradually increasing activities as tolerated.  You may have sexual intercourse when it is comfortable.  Refrain from any heavy lifting or straining until approved by your doctor. You may drive when you are no longer taking prescription pain medication, you can comfortably wear a seatbelt, and you can safely maneuver your car and apply brakes. RETURN TO WORK:  __________________________________________________________ You should see your doctor in the office for a follow-up appointment approximately 3-5 days after your surgery.  Your doctor's nurse will typically make your follow-up appointment when she calls you with your pathology report.  Expect your pathology report 3-4business days after surgery. OTHER INSTRUCTIONS: ______________________________________________________________________________________________ ____________________________________________________________________________________________ WHEN TO CALL YOUR DR Barbaraann Avans: Fever over 101.0 Nausea and/or vomiting Extreme swelling or bruising Continued bleeding from incision. Increased pain, redness, or drainage from the incision. The clinic staff is available to answer your questions during regular business hours.  Please don't hesitate to call and ask to speak to one of the nurses for clinical concerns.  If you have a medical emergency, go to the nearest emergency room or call 911.  A surgeon from [facility] is always on call at the hospital. [address], [address], [place], [place]    [address] ? [address] [address], [place], [place]   [address] [phone] ? [phone] ? FAX [phone] Web site:  [email or web address]    About my Jackson-Pratt Bulb Drain  What is a Jackson-Pratt bulb? A Jackson-Pratt is a soft, round device used to collect drainage. It is connected to a long, thin drainage catheter, which is held in place by one or two small stiches near your surgical incision site. When the bulb is squeezed, it forms a vacuum, forcing the drainage to empty into the bulb.  Emptying the Jackson-Pratt bulb- To empty the bulb: 1. Release the plug on the top of the bulb. 2. Pour the bulb's contents into a measuring container which your nurse will provide. 3. Record the time emptied and amount of drainage. Empty the drain(s) as often as your     doctor or nurse recommends.  Date                  Time                    Amount (Drain 1)                 Amount (Drain 2)  _____________________________________________________________________  _____________________________________________________________________  _____________________________________________________________________  _____________________________________________________________________  _____________________________________________________________________  _____________________________________________________________________  _____________________________________________________________________  _____________________________________________________________________  Squeezing the Jackson-Pratt Bulb- To squeeze the bulb: 1. Make sure the plug at the top of the bulb is open. 2. Squeeze the bulb tightly in your fist. You will hear air squeezing from the bulb. 3. Replace the plug while the bulb is squeezed. 4. Use a safety pin to attach the bulb to your clothing. This will keep the catheter from     pulling at the bulb insertion site.  When to call your doctor- Call your doctor if: Drain site becomes red, swollen or hot. You have a fever greater than 101 degrees F. There is oozing at the drain  site. Drain falls out (apply a guaze bandage over the drain hole and secure it with tape). Drainage increases daily not related to activity patterns. (You will usually have more drainage when you are active than when you are resting.) Drainage has a bad odor.   

## 2022-08-23 NOTE — Progress Notes (Signed)
Pt D/C home with JP drain per MD Donne Hazel.

## 2022-08-23 NOTE — Discharge Summary (Signed)
Physician Discharge Summary  Patient ID: [name] MRN: [id number] DOB/AGE: Jun 25, 1976 46 y.o.  Admit date: 08/22/2022 Discharge date: 08/23/2022  Admission Diagnoses: Left breast cancer  Discharge Diagnoses:  Principal Problem:   S/P left mastectomy   Discharged Condition: good  Hospital Course: 13 yof s/p primary chemotherapy for recurrent breast cancer underwent left mrm and skin punch of back. Doing well in am tol diet, pain controlled, drain as expected  Consults: None  Significant Diagnostic Studies: none  Treatments: surgery: left mrm  Discharge Exam: Blood pressure 135/62, pulse (!) 58, temperature 97.9 F (36.6 C), temperature source Oral, resp. rate 18, height '5\' 2"'$  (1.575 m), weight 83.5 kg, last menstrual period 01/11/2019, SpO2 99 %. No hematoma, drain as expected  Disposition: Discharge disposition: 01-Home or Self Care        Allergies as of 08/23/2022       Reactions   Omnipaque [iohexol] Other (See Comments)   Throat itching, some SOB and some chest pain post administration    Doxorubicin Hcl Rash   Experience chest tightness, abdominal pain, and redness around lips during and shortly after doxorubicin bolus.    Keytruda [pembrolizumab] Other (See Comments)   Lung irritation; skin breakout; swelling   Other Itching   Almonds, cherries, peaches   Tape    Redness         Medication List     STOP taking these medications    albuterol 108 (90 Base) MCG/ACT inhaler Commonly known as: VENTOLIN HFA   cyclobenzaprine 5 MG tablet Commonly known as: FLEXERIL   dexamethasone 4 MG tablet Commonly known as: DECADRON   doxycycline 100 MG tablet Commonly known as: VIBRA-TABS   EPINEPHrine 0.3 mg/0.3 mL Soaj injection Commonly known as: EPI-PEN   Olopatadine HCl 0.2 % Soln   omeprazole 20 MG capsule Commonly known as: PRILOSEC   prochlorperazine 10 MG tablet Commonly known as: COMPAZINE       TAKE these medications     methocarbamol 500 MG tablet Commonly known as: ROBAXIN Take 1 tablet (500 mg total) by mouth every 8 (eight) hours as needed for muscle spasms.   oxyCODONE 5 MG immediate release tablet Commonly known as: Oxy IR/ROXICODONE Take 1 tablet (5 mg total) by mouth every 4 (four) hours as needed for moderate pain.        Follow-up Information     Rolm Bookbinder, MD Follow up in 2 week(s).   Specialty: General Surgery Contact information: [address] [place] [address] [phone]                 Signed: Rolm Bookbinder 08/23/2022, 10:29 AM

## 2022-08-24 ENCOUNTER — Other Ambulatory Visit (HOSPITAL_COMMUNITY): Payer: Self-pay | Admitting: General Surgery

## 2022-08-24 DIAGNOSIS — M7989 Other specified soft tissue disorders: Secondary | ICD-10-CM

## 2022-08-25 ENCOUNTER — Ambulatory Visit (HOSPITAL_COMMUNITY)
Admission: RE | Admit: 2022-08-25 | Discharge: 2022-08-25 | Disposition: A | Discharge status: Home / Self Care | Payer: No Typology Code available for payment source | Source: Ambulatory Visit | Attending: General Surgery | Admitting: General Surgery

## 2022-08-25 DIAGNOSIS — M7989 Other specified soft tissue disorders: Secondary | ICD-10-CM

## 2022-08-25 LAB — SURGICAL PATHOLOGY

## 2022-08-31 ENCOUNTER — Ambulatory Visit: Payer: Self-pay | Admitting: Nurse Practitioner

## 2022-09-19 ENCOUNTER — Ambulatory Visit: Payer: No Typology Code available for payment source | Attending: General Surgery

## 2022-09-19 VITALS — Wt 187.1 lb

## 2022-09-19 DIAGNOSIS — Z483 Aftercare following surgery for neoplasm: Secondary | ICD-10-CM | POA: Insufficient documentation

## 2022-09-19 NOTE — Therapy (Signed)
OUTPATIENT PHYSICAL THERAPY SOZO SCREENING NOTE   Patient Name: [name] MRN: [id number] DOB:12-24-1975, 47 y.o., female Today's Date: 09/19/2022  PCP: [name], NP REFERRING PROVIDER: Rolm Bookbinder, MD   PT End of Session - 09/19/22 [phone]     Visit Number 5   # unchanged due to screen only   PT Start Time 0915    PT Stop Time 0930    PT Time Calculation (min) 15 min    Activity Tolerance Patient tolerated treatment well    Behavior During Therapy [facility] for tasks assessed/performed             Past Medical History:  Diagnosis Date   Allergy    Asthma    Breast cancer in female [facility])    Left   Breast disorder    breast cancer May 2021   Chronic back pain    Headache    History of breast cancer    History of COVID-19 04/20/2021   Hx of migraines    Morbid obesity ([place])    Personal history of chemotherapy    Personal history of radiation therapy    Past Surgical History:  Procedure Laterality Date   BREAST LUMPECTOMY     BREAST LUMPECTOMY WITH RADIOACTIVE SEED AND SENTINEL LYMPH NODE BIOPSY Left 07/15/2020   Procedure: LEFT BREAST LUMPECTOMY WITH RADIOACTIVE SEED AND LEFT AXILLARY SENTINEL LYMPH NODE BIOPSY, LEFT AXILLARY NODE RADIOACTIVE SEED GUIDED EXCISION, LEFT BREAST RADIOACTIVE SEED GUIDED EXCISION IM NODE;  Surgeon: Rolm Bookbinder, MD;  Location: [place];  Service: General;  Laterality: Left;  PEC BLOCK   CESAREAN SECTION WITH BILATERAL TUBAL LIGATION Bilateral 07/23/2015   Procedure: CESAREAN SECTION WITH BILATERAL TUBAL LIGATION;  Surgeon: Donnamae Jude, MD;  Location: [facility];  Service: Obstetrics;  Laterality: Bilateral;   IR IMAGING GUIDED PORT INSERTION  02/11/2022   MODIFIED MASTECTOMY Left 08/22/2022   Procedure: LEFT MODIFIED RADICAL MASTECTOMY, LEFT AXILLARY NODE DISSECTION, AND SKIN BIOPSY OF BACK;  Surgeon: Rolm Bookbinder, MD;  Location: [facility];  Service: General;  Laterality: Left;   PORT-A-CATH REMOVAL N/A 04/30/2021    Procedure: REMOVAL PORT-A-CATH;  Surgeon: Rolm Bookbinder, MD;  Location: [place];  Service: General;  Laterality: N/A;   PORTACATH PLACEMENT Right 02/11/2020   Procedure: INSERTION PORT-A-CATH WITH ULTRASOUND GUIDANCE;  Surgeon: Rolm Bookbinder, MD;  Location: [place];  Service: General;  Laterality: Right;   TUBAL LIGATION     Patient Active Problem List   Diagnosis Date Noted   S/P left mastectomy 08/22/2022   Burning with urination 06/01/2022   Swelling of left upper extremity 05/18/2022   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[place]) 04/01/2022   Encounter for dental examination 03/11/2022   Acute apical periodontitis 03/11/2022   Phobia of dental procedure 03/11/2022   Defective dental restoration 03/11/2022   Accretions on teeth 03/11/2022   Chronic periodontitis 03/11/2022   Teeth missing 03/11/2022   Caries 03/11/2022   Generalized gingival recession 03/11/2022   Pain, dental 03/08/2022   Cancer-related pain 03/08/2022   Port-A-Cath in place 02/15/2022   Oral allergy syndrome, subsequent encounter 02/03/2022   Adverse effect of other drugs, medicaments and biological substances, subsequent encounter 02/02/2022   Other adverse food reactions, not elsewhere classified, subsequent encounter 02/02/2022   Chest tightness 02/02/2022   Other allergic rhinitis 02/02/2022   Allergic conjunctivitis of both eyes 02/02/2022   Language barrier 12/11/2020   Morbid obesity ([place])    History of breast cancer  Genetic testing 02/13/2020   Malignant neoplasm of upper-outer quadrant of left breast in female, estrogen receptor positive ([place]) 01/27/2020   Impaired ability to use community resources due to language barrier 07/13/2013    REFERRING DIAG: left breast cancer at risk for lymphedema  THERAPY DIAG:  Aftercare following surgery for neoplasm  PERTINENT HISTORY:  Patient was diagnosed on 01/23/2020 with left grade III invasive ductal carcinoma  breast cancer. It is ER positive, PR negative, and HER2 negative with a Ki67 of 40%. She has 2 abnormal appearing lymph nodes and 1 was biopsied and found to be positive at time of diagnosis. Patient with interpreter present. She underwent neoadjuvant chemotherapy from 02/12/2020 - 05/28/2020. 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 She underwent a left lumpectomy and sentinel node biopsy ( 4 negative nodes) on 07/15/2020. She will undergo radiation and anti-estrogen therapy. She reports her neuropathy has resolved and has some limitation in shoulder ROM   PRECAUTIONS: left UE Lymphedema risk, None  SUBJECTIVE: Pt returns for her 3 month L-Dex screen.  "I feel like my arm has been swelling some recently. Also some in my hand."  PAIN:  Are you having pain? No  SOZO SCREENING:  Patient was assessed today using the SOZO machine to determine the lymphedema index score. This was compared to her baseline score. It was determined that she is NOT within the recommended range when compared to her baseline and so she was fitted for a compression garment while in the clinic today. It is recommended she return in 1 month to be reassessed. If she continues to measure outside the recommended range, physical therapy treatment will be recommended at that time and a referral requested.   L-DEX FLOWSHEETS - 09/19/22 0900       L-DEX LYMPHEDEMA SCREENING   Measurement Type Unilateral    L-DEX MEASUREMENT EXTREMITY Upper Extremity    POSITION  Standing    DOMINANT SIDE Right    At Risk Side Left    BASELINE SCORE (UNILATERAL) 3    L-DEX SCORE (UNILATERAL) 9.9    VALUE CHANGE (UNILAT) 6.9              Otelia Limes, PTA 09/19/2022, 9:31 AM

## 2022-09-20 ENCOUNTER — Other Ambulatory Visit: Payer: Self-pay

## 2022-09-22 ENCOUNTER — Telehealth: Payer: Self-pay | Admitting: Nurse Practitioner

## 2022-09-22 NOTE — Telephone Encounter (Signed)
Caller & Relationship to patient:  MRN #  [id number]   Call Back Number:   Date of Last Office Visit: 08/31/2022     Date of Next Office Visit: 09/30/2022    Medication(s) to be Refilled: Cholesterol, VD  Preferred Pharmacy: [facility] on [address]  ** Please notify patient to allow 48-72 hours to process** **Let patient know to contact pharmacy at the end of the day to make sure medication is ready. ** **If patient has not been seen in a year or longer, book an appointment **Advise to use MyChart for refill requests OR to contact their pharmacy

## 2022-09-26 NOTE — Telephone Encounter (Signed)
Medication is not in pt chart. LVM for pt to call back to advise the name of the med. [place]

## 2022-09-29 NOTE — Telephone Encounter (Signed)
Called using interpretor services and LMOM to call back. I see atorvastatin in her chart but she stated not taking last year at appt. Waiting on call back. Patient also has appt tomorrow in office.

## 2022-09-30 ENCOUNTER — Encounter: Payer: Self-pay | Admitting: Nurse Practitioner

## 2022-09-30 ENCOUNTER — Ambulatory Visit (INDEPENDENT_AMBULATORY_CARE_PROVIDER_SITE_OTHER): Payer: No Typology Code available for payment source | Admitting: Nurse Practitioner

## 2022-09-30 VITALS — BP 119/71 | HR 100 | Temp 97.6°F | Ht 61.25 in | Wt 188.8 lb

## 2022-09-30 DIAGNOSIS — Z0000 Encounter for general adult medical examination without abnormal findings: Secondary | ICD-10-CM | POA: Insufficient documentation

## 2022-09-30 DIAGNOSIS — E559 Vitamin D deficiency, unspecified: Secondary | ICD-10-CM

## 2022-09-30 DIAGNOSIS — M545 Low back pain, unspecified: Secondary | ICD-10-CM

## 2022-09-30 DIAGNOSIS — E7849 Other hyperlipidemia: Secondary | ICD-10-CM

## 2022-09-30 DIAGNOSIS — G8929 Other chronic pain: Secondary | ICD-10-CM

## 2022-09-30 DIAGNOSIS — Z1329 Encounter for screening for other suspected endocrine disorder: Secondary | ICD-10-CM

## 2022-09-30 LAB — POCT GLYCOSYLATED HEMOGLOBIN (HGB A1C)
HbA1c POC (<> result, manual entry): 5.6 % (ref 4.0–5.6)
HbA1c, POC (controlled diabetic range): 5.6 % (ref 0.0–7.0)
HbA1c, POC (prediabetic range): 5.6 % — AB (ref 5.7–6.4)
Hemoglobin A1C: 5.6 % (ref 4.0–5.6)

## 2022-09-30 MED ORDER — MELOXICAM 7.5 MG PO TABS: 7.5000 mg | ORAL_TABLET | Freq: Every day | ORAL | 0 refills | Status: DC

## 2022-09-30 NOTE — Patient Instructions (Signed)
1. Health care maintenance  - COMPLETE METABOLIC PANEL WITH GFR  2. Thyroid disorder screen  - TSH  3. Vitamin D deficiency  - VITAMIN D 25 Hydroxy (Vit-D Deficiency, Fractures)  4. Other hyperlipidemia  - Lipid panel  5. Chronic bilateral low back pain without sciatica  - meloxicam (MOBIC) 7.5 MG tablet; Take 1 tablet (7.5 mg total) by mouth daily.  Dispense: 30 tablet; Refill: 0    Follow up:  Follow up in 3 months

## 2022-09-30 NOTE — Progress Notes (Signed)
$'@Patient'l$  ID: [name], female    DOB: Nov 07, 1975, 47 y.o.   MRN: [id number]  Chief Complaint  Patient presents with   Follow-up    Headaches recently and her bones seem to hurt. Her back is hurting more than usual as well. Had left mastectomy 08/22/22. Wants lab work-she is fasting. She was vit d deficient. Never got med sent in.     Referring provider: [name], NP   HPI  [name] 47 y.o. female  has a past medical history of Allergy, Asthma, Breast cancer in female [facility]), Breast disorder, Chronic back pain, History of breast cancer, History of COVID-19 (04/20/2021), migraines, and Morbid obesity ([place]).   Patient presents today for follow-up visit.  She would like labs checked today.  She had recent labs completed in the hospital that we will recheck these today.  She did recently have mastectomy.  She has followed up with surgeon and does have a follow-up appointment scheduled complains of pain and headaches at night. Has not been staying well hydrated. Has not discussed pain with oncology or surgeon. Did have recent MRI of brain which was clear. Denies f/c/s, n/v/d, hemoptysis, PND, leg swelling Denies chest pain or edema         Allergies  Allergen Reactions   Omnipaque [Iohexol] Other (See Comments)    Throat itching, some SOB and some chest pain post administration    Doxorubicin Hcl Rash    Experience chest tightness, abdominal pain, and redness around lips during and shortly after doxorubicin bolus.    Keytruda [Pembrolizumab] Other (See Comments)    Lung irritation; skin breakout; swelling   Other Itching    Almonds, cherries, peaches   Tape     Redness     Immunization History  Administered Date(s) Administered   Influenza,inj,Quad PF,6+ Mos 11/01/2016   Janssen (J&J) SARS-COV-2 Vaccination 01/31/2020   PFIZER(Purple Top)SARS-COV-2 Vaccination 08/19/2020   Unspecified SARS-COV-2 Vaccination 11/25/2020    Past Medical  History:  Diagnosis Date   Allergy    Asthma    Breast cancer in female [facility])    Left   Breast disorder    breast cancer May 2021   Chronic back pain    Headache    History of breast cancer    History of COVID-19 04/20/2021   Hx of migraines    Morbid obesity ([place])    Personal history of chemotherapy    Personal history of radiation therapy     Tobacco History: Social History   Tobacco Use  Smoking Status Never  Smokeless Tobacco Never   Counseling given: Not Answered   Outpatient Encounter Medications as of 09/30/2022  Medication Sig   meloxicam (MOBIC) 7.5 MG tablet Take 1 tablet (7.5 mg total) by mouth daily.   [DISCONTINUED] methocarbamol (ROBAXIN) 500 MG tablet Take 1 tablet (500 mg total) by mouth every 8 (eight) hours as needed for muscle spasms.   [DISCONTINUED] oxyCODONE (OXY IR/ROXICODONE) 5 MG immediate release tablet Take 1 tablet (5 mg total) by mouth every 4 (four) hours as needed for moderate pain.   No facility-administered encounter medications on file as of 09/30/2022.     Review of Systems  Review of Systems  Constitutional: Negative.   HENT: Negative.    Cardiovascular: Negative.   Gastrointestinal: Negative.   Allergic/Immunologic: Negative.   Neurological:  Positive for headaches.  Psychiatric/Behavioral: Negative.         Physical Exam  BP 119/71  Pulse 100   Temp 97.6 F (36.4 C) (Temporal)   Ht 5' 1.25" (1.556 m)   Wt 188 lb 12.8 oz (85.6 kg)   LMP 01/11/2019 (Exact Date)   SpO2 (!) 88%   BMI 35.38 kg/m   Wt Readings from Last 5 Encounters:  09/30/22 188 lb 12.8 oz (85.6 kg)  09/19/22 187 lb 2 oz (84.9 kg)  08/22/22 184 lb (83.5 kg)  08/18/22 184 lb (83.5 kg)  07/20/22 186 lb 12.8 oz (84.7 kg)     Physical Exam Vitals and nursing note reviewed.  Constitutional:      General: She is not in acute distress.    Appearance: She is well-developed.  Cardiovascular:     Rate and Rhythm: Normal rate and regular rhythm.   Pulmonary:     Effort: Pulmonary effort is normal.     Breath sounds: Normal breath sounds.  Neurological:     Mental Status: She is alert and oriented to person, place, and time.      Lab Results:  CBC    Component Value Date/Time   WBC 4.7 08/18/2022 1131   RBC 4.34 08/18/2022 1131   HGB 12.5 08/18/2022 1131   HGB 11.6 (L) 07/20/2022 0856   HGB 13.3 10/01/2021 1004   HCT 38.7 08/18/2022 1131   HCT 40.4 10/01/2021 1004   PLT 247 08/18/2022 1131   PLT 220 07/20/2022 0856   PLT 212 10/01/2021 1004   MCV 89.2 08/18/2022 1131   MCV 87 10/01/2021 1004   MCH 28.8 08/18/2022 1131   MCHC 32.3 08/18/2022 1131   RDW 15.8 (H) 08/18/2022 1131   RDW 13.3 10/01/2021 1004   LYMPHSABS 1.2 07/20/2022 0856   LYMPHSABS 1.6 10/01/2021 1004   MONOABS 0.3 07/20/2022 0856   EOSABS 0.1 07/20/2022 0856   EOSABS 0.1 10/01/2021 1004   BASOSABS 0.0 07/20/2022 0856   BASOSABS 0.0 10/01/2021 1004    BMET    Component Value Date/Time   NA 141 07/20/2022 0856   NA 141 10/01/2021 1004   K 3.8 07/20/2022 0856   CL 107 07/20/2022 0856   CO2 27 07/20/2022 0856   GLUCOSE 126 (H) 07/20/2022 0856   BUN 16 07/20/2022 0856   BUN 10 10/01/2021 1004   CREATININE 0.80 07/20/2022 0856   CALCIUM 9.2 07/20/2022 0856   GFRNONAA >60 07/20/2022 0856   GFRAA >60 06/03/2020 0949   GFRAA >60 02/26/2020 0915      Assessment & Plan:   Health care maintenance - COMPLETE METABOLIC PANEL WITH GFR  2. Thyroid disorder screen  - TSH  3. Vitamin D deficiency  - VITAMIN D 25 Hydroxy (Vit-D Deficiency, Fractures)  4. Other hyperlipidemia  - Lipid panel  5. Chronic bilateral low back pain without sciatica  - meloxicam (MOBIC) 7.5 MG tablet; Take 1 tablet (7.5 mg total) by mouth daily.  Dispense: 30 tablet; Refill: 0    Follow up:  Follow up in 3 months     [name], NP 09/30/2022

## 2022-09-30 NOTE — Assessment & Plan Note (Signed)
-  COMPLETE METABOLIC PANEL WITH GFR  2. Thyroid disorder screen  - TSH  3. Vitamin D deficiency  - VITAMIN D 25 Hydroxy (Vit-D Deficiency, Fractures)  4. Other hyperlipidemia  - Lipid panel  5. Chronic bilateral low back pain without sciatica  - meloxicam (MOBIC) 7.5 MG tablet; Take 1 tablet (7.5 mg total) by mouth daily.  Dispense: 30 tablet; Refill: 0    Follow up:  Follow up in 3 months

## 2022-10-01 ENCOUNTER — Encounter: Payer: Self-pay | Admitting: Hematology and Oncology

## 2022-10-01 LAB — LIPID PANEL
Chol/HDL Ratio: 7 ratio — ABNORMAL HIGH (ref 0.0–4.4)
Cholesterol, Total: 303 mg/dL — ABNORMAL HIGH (ref 100–199)
HDL: 43 mg/dL (ref >39)
LDL Chol Calc (NIH): 223 mg/dL — ABNORMAL HIGH (ref 0–99)
Triglycerides: 190 mg/dL — ABNORMAL HIGH (ref 0–149)
VLDL Cholesterol Cal: 37 mg/dL (ref 5–40)

## 2022-10-01 LAB — CMP14+EGFR
ALT: 16 IU/L (ref 0–32)
AST: 20 IU/L (ref 0–40)
Albumin/Globulin Ratio: 1.7 (ref 1.2–2.2)
Albumin: 4.5 g/dL (ref 3.9–4.9)
Alkaline Phosphatase: 102 IU/L (ref 44–121)
BUN/Creatinine Ratio: 14 (ref 9–23)
BUN: 11 mg/dL (ref 6–24)
Bilirubin Total: 0.4 mg/dL (ref 0.0–1.2)
CO2: 23 mmol/L (ref 20–29)
Calcium: 10 mg/dL (ref 8.7–10.2)
Chloride: 101 mmol/L (ref 96–106)
Creatinine, Ser: 0.78 mg/dL (ref 0.57–1.00)
Globulin, Total: 2.6 g/dL (ref 1.5–4.5)
Glucose: 100 mg/dL — ABNORMAL HIGH (ref 70–99)
Potassium: 4.2 mmol/L (ref 3.5–5.2)
Sodium: 138 mmol/L (ref 134–144)
Total Protein: 7.1 g/dL (ref 6.0–8.5)
eGFR: 95 mL/min/{1.73_m2} (ref >59)

## 2022-10-01 LAB — VITAMIN D 25 HYDROXY (VIT D DEFICIENCY, FRACTURES): Vit D, 25-Hydroxy: 18.3 ng/mL — ABNORMAL LOW (ref 30.0–100.0)

## 2022-10-01 LAB — TSH: TSH: 5.2 u[IU]/mL — ABNORMAL HIGH (ref 0.450–4.500)

## 2022-10-02 ENCOUNTER — Other Ambulatory Visit: Payer: Self-pay

## 2022-10-05 ENCOUNTER — Telehealth: Payer: Self-pay | Admitting: Hematology and Oncology

## 2022-10-05 ENCOUNTER — Other Ambulatory Visit: Payer: Self-pay | Admitting: Nurse Practitioner

## 2022-10-05 DIAGNOSIS — R7989 Other specified abnormal findings of blood chemistry: Secondary | ICD-10-CM

## 2022-10-05 MED ORDER — VITAMIN D (ERGOCALCIFEROL) 1.25 MG (50000 UNIT) PO CAPS: 50000.0000 [IU] | ORAL_CAPSULE | ORAL | 2 refills | Status: DC

## 2022-10-05 MED ORDER — SIMVASTATIN 20 MG PO TABS: 20.0000 mg | ORAL_TABLET | Freq: Every evening | ORAL | 11 refills | Status: AC

## 2022-10-05 NOTE — Telephone Encounter (Signed)
Contacted patient to scheduled appointments. Patient is aware of appointments that are scheduled.   

## 2022-10-06 NOTE — Telephone Encounter (Signed)
Pt called and is asking for lab results   You will need an interpreter

## 2022-10-06 NOTE — Telephone Encounter (Signed)
Pt was advised [facility] 

## 2022-10-07 NOTE — Progress Notes (Incomplete)
Location of Breast Cancer: Malignant neoplasm of upper-outer quadrant of left breast in female, estrogen receptor positive   Histology per Pathology Report:  FINAL MICROSCOPIC DIAGNOSIS:  A. BACK, LEFT, BIOPSY: - Skin with underlying benign fibroadipose tissue - Negative for carcinoma  B. BREAST, LEFT, MASTECTOMY: - Complete therapeutic response with no evidence of residual carcinoma - Therapy related changes, including hyaline fibrosis and focal calcification - Benign unremarkable nipple and skin - See comment  C. LYMPH NODE, LEFT AXILLARY, EXCISION: - Lymph node with prominent therapy related changes, including hyaline fibrosis - Negative for carcinoma  COMMENT:  B.  Please note that the entire tumor bed was submitted for histopathologic evaluation.  The appropriate Pathologic stage (AJCC 8th edition) is: ypT0 ypN0  GROSS DESCRIPTION:  A. Received fresh and subsequently placed in formalin labeled with the patient's name and "Punch biopsy left back" is a 0.3 cm tan skin punch with a depth of 0.7 cm that is inked black and submitted in toto in cassette A1.  B. Specimen: received fresh and subsequently placed in formalin labeled with the patient's name and "Recurrent left breast cancer" is a simple mastectomy. Time out of body: not recorded in OR; time in formalin 11:12 on 08/22/22. Specimen integrity: intact Weight: 862 g Size: 21.2 cm medial-lateral, 16.3 cm superior-inferior, and 8.1 cm anterior-posterior. Skin: horizontally oriented on the anterior surface is a 19.2 xc 8.2 cm tan skin ellipse with central, erect nipple surrounded by an areola 5.1 cm in diameter. A couple small points of puckering are present near the superior border of skin. Tumor/cavity: starting retroareolar and extending medially is a 9.7 x 5.3 x 2.5 cm tan, indurated area grossly consistent with tumor bed. Margins: 0.4 cm from anterior-inferior (blue), 1.6 cm from anterior-superior (red) and  2.0 cm from posterior (black). Uninvolved parenchyma: the remaining, uninvolved parenchyma is approximately 90% adipose tissue and 10% fibrous tissue without additional masses or lesions. Lymph nodes: none grossly identified. Block summary: B1-B13: tumor bed sequentially submitted from lateral to medial (anterior-inferior margin in 3,4,12,13; anterior-superior in 11; posterior in 7) B14: nipple, perpendicular B15-B16: puckered skin B17: upper outer quadrant B18: upper inner quadrant B19: lower inner quadrant B20: lower outer quadrant  C. Received fresh and subsequently placed in formalin labeled with the patient's name and "Left axillary lymph nodes" are two possible lymph nodes ranging from 0.5-5.7 cm in greatest dimension. The nodal tissue is entirely submitted as follows: C1: smaller node, whole C2-C4: larger node, sectioned  (LEF 08/23/2022)   Final Diagnosis performed by Jaquita Folds, MD.   Electronically signed 08/25/2022   Receptor Status:   Did patient present with symptoms (if so, please note symptoms) or was this found on screening mammography?: found it herself  Past/Anticipated interventions by surgeon, if any: 08/22/2022  9:19 AM   Signed      Preoperative diagnosis: Left inflammatory breast cancer, recurrent Postoperative diagnosis: Same as above Procedure: Left modified radical mastectomy, punch biopsy skin of back Surgeon: Dr. 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  She underwent neoadjuvant chemotherapy followed by lumpectomy and targeted node dissection for a residual ypT1b ypN0 cancer.  Repeat prognostic panel showed this to be triple negative.  She then underwent radiotherapy.  Her genetic testing at that time was negative.  She then did well but had interval development of diffuse skin thickening a couple years later.  I did a  punch biopsy of her skin at  that time and this was consistent with inflammatory breast cancer.  She has been treated with chemo since then.  This and she was going through chemo until she started Enhertu.  This eventually got better.  She has no distant disease on her imaging.  She had 4 masses in her breast that are all smaller in her axilla and now is borderline.  We discussed proceeding with a modified radical mastectomy at this point to try to achieve no evidence of disease.     Past/Anticipated interventions by medical oncology, if any:  Dr. Chryl Heck on 08-16-22 SUMMARY OF ONCOLOGIC HISTORY:    Oncology History  Malignant neoplasm of upper-outer quadrant of left breast in female, estrogen receptor positive ([place])  01/23/2020 Initial Diagnosis    [place] woman status post left breast upper outer quadrant biopsy 01/23/2020 for a clinical T3 N1, 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             (a) chest CT scan and bone scan 02/07/2020 showed no evidence of metastatic disease    01/29/2020 Cancer Staging    Staging form: Breast, AJCC 8th Edition - Clinical stage from 01/29/2020: Stage IIIA (cT2, cN1, cM0, G3, ER+, PR-, HER2-) - Signed by Benay Pike, MD on 06/28/2022 Stage prefix: Initial diagnosis Histologic grading system: 3 grade system    02/07/2020 Genetic Testing    Negative genetic testing. No pathogenic variants identified on the [facility] Common Hereditary Cancers Panel. VUS in MSH6 called c.2744C>G identified. The report date is 02/07/2020.   The Common Hereditary Cancers Panel offered by [facility] includes sequencing and/or deletion duplication testing of the following 48 genes: APC, ATM, AXIN2, BARD1, BMPR1A, BRCA1, BRCA2, BRIP1, CDH1, CDKN2A (p14ARF), CDKN2A (p16INK4a), CKD4, CHEK2, CTNNA1, DICER1, EPCAM (Deletion/duplication testing only), GREM1 (promoter region deletion/duplication testing only), KIT, MEN1, MLH1, MSH2, MSH3, MSH6, MUTYH, NBN, NF1,  NHTL1, PALB2, PDGFRA, PMS2, POLD1, POLE, PTEN, RAD50, RAD51C, RAD51D, RNF43, SDHB, SDHC, SDHD, SMAD4, SMARCA4. STK11, TP53, TSC1, TSC2, and VHL.  The following genes were evaluated for sequence changes only: SDHA and HOXB13 c.251G>A variant only.    02/12/2020 - 05/28/2020 Neo-Adjuvant Chemotherapy    neoadjuvant chemotherapy consisting of doxorubicin and cyclophosphamide in dose dense fashion x4 started 02/12/2020, completed 03/25/2020, followed by paclitaxel and carboplatin weekly x12 started 04/08/2020 and completed on 05/28/2020             (a) echo 02/07/2020 shows an ejection fraction in the 55-60% range             (b) Epirubicin substituted for Doxorubicin starting with cycle 2 of AC secondary to allergic rash from Doxorubicin    07/15/2020 Surgery    status post left lumpectomy and sentinel lymph node sampling 07/15/2020 for a ypT1b ypN0 residual invasive ductal carcinoma, grade 3, with negative margins.             (a) a total of 4 left axillary lymph nodes were removed             (b) repeat prognostic panel on the final pathology again triple negative, with an MIB-1 of 50%.    07/15/2020 Cancer Staging    Staging form: Breast, AJCC 8th Edition - Pathologic stage from 07/15/2020: No Stage Recommended (ypT1b, pN0, cM0, G3, ER-, PR-, HER2-) - Signed by Gardenia Phlegm, NP on 04/01/2022 Stage prefix: Post-therapy Histologic grading system: 3 grade system    08/26/2020 - 10/09/2020  Radiation Therapy    adjuvant radiation completed 10/09/2020             (a) sensitizing capecitabine attempted but not tolerated by the patient   08/26/2020 through 10/09/2020 Site Technique Total Dose (Gy) Dose per Fx (Gy) Completed Fx Beam Energies  Breast, Left: Breast_Lt 3D 50/50 2 25/25 15X  Breast, Left: Breast_Lt_PAB_SCV 3D 50/50 2 25/25 10X, 15X  Breast, Left: Breast_Lt_Bst 3D 10/10 2 5/5 6X, 10X    11/23/2020 - 01/04/2021 Adjuvant Chemotherapy    adjuvant pembrolizumab started 11/23/2020, to  continue for 1 year             (a) discontinued after 01/04/2021 dose with multiple side effects requiring steroids for resolution    03/30/2022 Mammogram    Mammogram and ultrasound show numerous masses throughout left breast not visualized in 01/2022, +left axillary lymphadenopathy, skin thickening in right breast and in upper abdomen just below sternum    04/05/2022 -  Chemotherapy    Patient is on Treatment Plan : BREAST METASTATIC Fam-Trastuzumab Deruxtecan-nxki (Enhertu) (5.4) q21d     04/06/2022 - 04/26/2022 Chemotherapy    Patient is on Treatment Plan : BREAST METASTATIC fam-trastuzumab deruxtecan-nxki (Enhertu) q21d      ASSESSMENT and THERAPY PLAN:    Malignant neoplasm of upper-outer quadrant of left breast in female, estrogen receptor positive ([place]) Natayah is a 47 year old Spanish-speaking woman who has a 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  She was most recently seen for ongoing breast redness, given a course of antibiotics but had persistent redness hence we proceeded with a punch biopsy.  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  Pathology was sent to [facility] for second opinion as well. This confirmed triple negative breast cancer.   She progressed on after 2 cycles of TC hence we have switched her treatment to Enhertu.   Repeat biopsy demonstrated her HER2 to be 1+. After her first infusion, she came back to the clinic with increasing redness history of treated empirically for cellulitis, gave her a dose of IV vancomycin, oral doxycycline She is now here before planned cycle 4 of Enhertu.  She has had robust response so far with decreased erythema, induration and edema of the left breast, medial portion of the right breast as well as midline. She is now here after 6 cycles of Enhertu, for an unplanned visit, she is worried about this new erythema and swelling of  the left forearm, small patch on the left breast, some vague sense of ill being and ongoing skin erythema of the posterior skin. I am really not sure if the posterior erythema is any better with treatment and I am not really sure if this is even related to cancer since it has not shown any change.  Then, Left forearm swelling is likely related to an infection, will try doxycycline 100 mg PO BID for 7 days. I will discuss with Dr Donne Hazel about this patient as well.   #Headaches, MR brain neg. She is overall very anxious and needs lot of reassurance.   # Fatigue, likely from recent treatment and ongoing cancer.   I spent 40 minutes in the care of this patient.             Electronically signed by Benay Pike, MD at 08/17/2022  7:51 AM  Lymphedema issues, if any:  started a few days prior to having surgery but worse after  surgery-wearing compression wrap  Pain issues, if any:  yes, back pain, worse after surgery  SAFETY ISSUES: Prior radiation? Yes, Feb 2022, Breast Pacemaker/ICD? none Possible current pregnancy? No, Is the patient on methotrexate? none  Current Complaints / other details:  wants to know how many treatments and what to expect  Vitals:   10/11/22 1254  BP: 138/75  Pulse: 73  Resp: 19  Temp: (!) 97.1 F (36.2 C)  SpO2: 100%

## 2022-10-10 NOTE — Progress Notes (Shared)
Radiation Oncology         [phone] ________________________________  Outpatient Re-Consultation  Name: [name] MRN: [id number]  Date: 10/11/2022  DOB: 1976-07-06  [id number], Kriste Basque, NP  Rolm Bookbinder, MD   REFERRING PHYSICIAN: Rolm Bookbinder, MD  DIAGNOSIS:    ICD-10-CM   1. Malignant neoplasm of upper-outer quadrant of left breast in female, estrogen receptor positive ([place])  C50.412    Z17.0        Cancer Staging  Malignant neoplasm of upper-outer quadrant of left breast in female, estrogen receptor positive ([place]) Staging form: Breast, AJCC 8th Edition - Clinical stage from 01/29/2020: Stage IIIA (cT2, cN1, cM0, G3, ER+, PR-, HER2-) - Signed by Benay Pike, MD on 06/28/2022 Stage prefix: Initial diagnosis Histologic grading system: 3 grade system - Pathologic stage from 07/15/2020: No Stage Recommended (ypT1b, pN0, cM0, G3, ER-, PR-, HER2-) - Signed by Gardenia Phlegm, NP on 04/01/2022 Stage prefix: Post-therapy Histologic grading system: 3 grade system   Stage *** *** Breast ***Q *** Carcinoma ***, ER*** / PR*** / Her2***, Grade ***  CHIEF COMPLAINT: Here to discuss management of left breast cancer  HISTORY OF PRESENT ILLNESS::[name] is a 47 y.o. female who is known to me for her history of left breast cancer diagnosed in 2021, s/p neoadjuvant chemotherapy, lumpectomy with TAD, XRT, and adjuvant pembrolizumab which was discontinued due to side effects. Her interval history since I last met with her for follow-up on *** in detailed as follows.   This past May, the patient presented to medical with the cc of several weeks of worsening eft breast erythema.The left breast erythema initially began 6 months prior and she completed a course of abx without improvement.   This prompted a diagnostic bilateral breast mammogram and left breast ultrasound on 01/18/22 which showed nterval development of diffuse skin thickening  overlying the left breast, with associated edema as well as parenchyma and trabecular thickening throughout the left breast. Differential considerations noted at the time included radiation changes, an infectious process, or inflammatory carcinoma.  Skin punch biopsy of the left breast on 01/18/22 collected by Dr. Donne Hazel showed findings most consistent with inflammatory breast cancer. Prognostics showed triple negative disease and a proliferation index of 1%.    Accordingly, the patient was referred to Dr. Chryl Heck on 01/28/22 for further management. During this visit, the patient also endorsed worsening headache, worsening back pain, and lack of energy, for which Dr. Chryl Heck recommended proceeding with staging work-up to excluded metastatic disease. The patient also consented to port placement at this time for consideration of chemotherapy.    MRI of the brain on 01/28/22 showed no evidence of intracranial metastatic disease.   CT CAP showed no evidence of metastatic disease within the chest, abdomen, or pelvis. CT also demonstrated increased diffuse left breast soft tissue edema and overlying skin thickening.   The patient then opted to proceed with neoadjuvant chemotherapy consisting of TC on 02/15/22. Following 2 cycles, the patient endorsed ongoing redness and induration of the left breast. Treatment was ultimately discontinued on *** due to concern for disease progression.     Bilateral diagnostic mammogram and ultrasound on 03/30/22 demonstrated numerous masses in the upper outer left breast concerning for malignancy. [place] also showed two abnormal lymph nodes in the left axilla concerning for nodal metastases, skin thickening in the inferior medial right breast possibly reflective of inflammatory breast cancer vs infection, and borderline to mildly abnormal lymph node in the right axilla.  ***  Biopsy on date of *** showed ***.  ER status: ***; PR status ***, Her2 status ***; Grade  ***.  ***  PREVIOUS RADIATION THERAPY: Yes   Intent: Curative Radiation Treatment Dates: 08/26/2020 through 10/09/2020 Site Technique Total Dose (Gy) Dose per Fx (Gy) Completed Fx Beam Energies  Breast, Left: Breast_Lt 3D 50/50 2 25/25 15X  Breast, Left: Breast_Lt_PAB_SCV 3D 50/50 2 25/25 10X, 15X  Breast, Left: Breast_Lt_Bst 3D 10/10 2 5/5 6X, 10X    PAST MEDICAL HISTORY:  has a past medical history of Allergy, Asthma, Breast cancer in female [facility]), Breast disorder, Chronic back pain, Headache, History of breast cancer, History of COVID-19 (04/20/2021), migraines, Morbid obesity ([place]), Personal history of chemotherapy, and Personal history of radiation therapy.    PAST SURGICAL HISTORY: Past Surgical History:  Procedure Laterality Date   BREAST LUMPECTOMY     BREAST LUMPECTOMY WITH RADIOACTIVE SEED AND SENTINEL LYMPH NODE BIOPSY Left 07/15/2020   Procedure: LEFT BREAST LUMPECTOMY WITH RADIOACTIVE SEED AND LEFT AXILLARY SENTINEL LYMPH NODE BIOPSY, LEFT AXILLARY NODE RADIOACTIVE SEED GUIDED EXCISION, LEFT BREAST RADIOACTIVE SEED GUIDED EXCISION IM NODE;  Surgeon: Rolm Bookbinder, MD;  Location: [facility];  Service: General;  Laterality: Left;  PEC BLOCK   CESAREAN SECTION WITH BILATERAL TUBAL LIGATION Bilateral 07/23/2015   Procedure: CESAREAN SECTION WITH BILATERAL TUBAL LIGATION;  Surgeon: Donnamae Jude, MD;  Location: [facility];  Service: Obstetrics;  Laterality: Bilateral;   IR IMAGING GUIDED PORT INSERTION  02/11/2022   MODIFIED MASTECTOMY Left 08/22/2022   Procedure: LEFT MODIFIED RADICAL MASTECTOMY, LEFT AXILLARY NODE DISSECTION, AND SKIN BIOPSY OF BACK;  Surgeon: Rolm Bookbinder, MD;  Location: [facility];  Service: General;  Laterality: Left;   PORT-A-CATH REMOVAL N/A 04/30/2021   Procedure: REMOVAL PORT-A-CATH;  Surgeon: Rolm Bookbinder, MD;  Location: [facility];  Service: General;  Laterality: N/A;   PORTACATH PLACEMENT Right 02/11/2020   Procedure: INSERTION  PORT-A-CATH WITH ULTRASOUND GUIDANCE;  Surgeon: Rolm Bookbinder, MD;  Location: [facility];  Service: General;  Laterality: Right;   TUBAL LIGATION      FAMILY HISTORY: family history includes Heart disease in her maternal grandmother.  SOCIAL HISTORY:  reports that she has never smoked. She has never used smokeless tobacco. She reports that she does not drink alcohol and does not use drugs.  ALLERGIES: Omnipaque [iohexol], Doxorubicin hcl, Keytruda [pembrolizumab], Other, and Tape  MEDICATIONS:  Current Outpatient Medications  Medication Sig Dispense Refill   meloxicam (MOBIC) 7.5 MG tablet Take 1 tablet (7.5 mg total) by mouth daily. 30 tablet 0   simvastatin (ZOCOR) 20 MG tablet Take 1 tablet (20 mg total) by mouth every evening. 30 tablet 11   Vitamin D, Ergocalciferol, (DRISDOL) 1.25 MG (50000 UNIT) CAPS capsule Take 1 capsule (50,000 Units total) by mouth every 7 (seven) days. 5 capsule 2   No current facility-administered medications for this encounter.    REVIEW OF SYSTEMS: As above in HPI.   PHYSICAL EXAM:  vitals were not taken for this visit.   General: Alert and oriented, in no acute distress HEENT: Head is normocephalic. Extraocular movements are intact. Oropharynx is clear. Neck: Neck is supple, no palpable cervical or supraclavicular lymphadenopathy. Heart: Regular in rate and rhythm with no murmurs, rubs, or gallops. Chest: Clear to auscultation bilaterally, with no rhonchi, wheezes, or rales. Abdomen: Soft, nontender, nondistended, with no rigidity or guarding. Extremities: No cyanosis or edema. Lymphatics: see Neck Exam Skin: No concerning lesions. Musculoskeletal: symmetric strength and muscle tone  throughout. Neurologic: Cranial nerves II through XII are grossly intact. No obvious focalities. Speech is fluent. Coordination is intact. Psychiatric: Judgment and insight are intact. Affect is appropriate. Breasts: *** . No other palpable masses  appreciated in the breasts or axillae *** .    ECOG = ***  0 - Asymptomatic (Fully active, able to carry on all predisease activities without restriction)  1 - Symptomatic but completely ambulatory (Restricted in physically strenuous activity but ambulatory and able to carry out work of a light or sedentary nature. For example, light housework, office work)  2 - Symptomatic, <50% in bed during the day (Ambulatory and capable of all self care but unable to carry out any work activities. Up and about more than 50% of waking hours)  3 - Symptomatic, >50% in bed, but not bedbound (Capable of only limited self-care, confined to bed or chair 50% or more of waking hours)  4 - Bedbound (Completely disabled. Cannot carry on any self-care. Totally confined to bed or chair)  5 - Death   Eustace Pen MM, Creech RH, Tormey DC, et al. [phone]). "Toxicity and response criteria of the [facility] Group". [place] Oncol. 5 (6): 649-55   LABORATORY DATA:  Lab Results  Component Value Date   WBC 4.7 08/18/2022   HGB 12.5 08/18/2022   HCT 38.7 08/18/2022   MCV 89.2 08/18/2022   PLT 247 08/18/2022   CMP     Component Value Date/Time   NA 138 09/30/2022 1149   K 4.2 09/30/2022 1149   CL 101 09/30/2022 1149   CO2 23 09/30/2022 1149   GLUCOSE 100 (H) 09/30/2022 1149   GLUCOSE 126 (H) 07/20/2022 0856   BUN 11 09/30/2022 1149   CREATININE 0.78 09/30/2022 1149   CREATININE 0.80 07/20/2022 0856   CALCIUM 10.0 09/30/2022 1149   PROT 7.1 09/30/2022 1149   ALBUMIN 4.5 09/30/2022 1149   AST 20 09/30/2022 1149   AST 22 07/20/2022 0856   ALT 16 09/30/2022 1149   ALT 23 07/20/2022 0856   ALKPHOS 102 09/30/2022 1149   BILITOT 0.4 09/30/2022 1149   BILITOT 0.3 07/20/2022 0856   GFRNONAA >60 07/20/2022 0856   GFRAA >60 06/03/2020 0949   GFRAA >60 02/26/2020 0915         RADIOGRAPHY: No results found.    IMPRESSION/PLAN: ***   It was a pleasure meeting the patient today. We  discussed the risks, benefits, and side effects of radiotherapy. I recommend radiotherapy to the *** to reduce her risk of locoregional recurrence by 2/3.  We discussed that radiation would take approximately *** weeks to complete and that I would give the patient a few weeks to heal following surgery before starting treatment planning. *** If chemotherapy were to be given, this would precede radiotherapy. We spoke about acute effects including skin irritation and fatigue as well as much less common late effects including internal organ injury or irritation. We spoke about the latest technology that is used to minimize the risk of late effects for patients undergoing radiotherapy to the breast or chest wall. No guarantees of treatment were given. The patient is enthusiastic about proceeding with treatment. I look forward to participating in the patient's care.  I will await her referral back to me for postoperative follow-up and eventual CT simulation/treatment planning.  On date of service, in total, I spent *** minutes on this encounter. Patient was seen in person.   __________________________________________   Eppie Gibson, MD  This document  serves as a record of services personally performed by Eppie Gibson, MD. It was created on her behalf by Roney Mans, a trained medical scribe. The creation of this record is based on the scribe's personal observations and the provider's statements to them. This document has been checked and approved by the attending provider.

## 2022-10-11 ENCOUNTER — Encounter: Payer: Self-pay | Admitting: Hematology and Oncology

## 2022-10-11 ENCOUNTER — Ambulatory Visit
Admission: RE | Admit: 2022-10-11 | Discharge: 2022-10-11 | Disposition: A | Discharge status: Home / Self Care | Payer: No Typology Code available for payment source | Source: Ambulatory Visit | Attending: Radiation Oncology | Admitting: Radiation Oncology

## 2022-10-11 ENCOUNTER — Inpatient Hospital Stay: Payer: Self-pay | Attending: Hematology and Oncology | Admitting: Hematology and Oncology

## 2022-10-11 ENCOUNTER — Other Ambulatory Visit: Payer: Self-pay | Admitting: *Deleted

## 2022-10-11 ENCOUNTER — Other Ambulatory Visit: Payer: Self-pay

## 2022-10-11 ENCOUNTER — Ambulatory Visit
Admission: RE | Admit: 2022-10-11 | Discharge: 2022-10-11 | Disposition: A | Discharge status: Home / Self Care | Payer: Self-pay | Source: Ambulatory Visit | Attending: Radiation Oncology | Admitting: Radiation Oncology

## 2022-10-11 ENCOUNTER — Encounter: Payer: Self-pay | Admitting: Radiation Oncology

## 2022-10-11 VITALS — BP 138/75 | HR 73 | Temp 97.1°F | Resp 19 | Ht 61.0 in | Wt 191.1 lb

## 2022-10-11 VITALS — BP 124/80 | HR 79 | Temp 97.7°F | Resp 16 | Ht 61.0 in | Wt 191.2 lb

## 2022-10-11 DIAGNOSIS — Z9012 Acquired absence of left breast and nipple: Secondary | ICD-10-CM | POA: Insufficient documentation

## 2022-10-11 DIAGNOSIS — Z791 Long term (current) use of non-steroidal anti-inflammatories (NSAID): Secondary | ICD-10-CM | POA: Insufficient documentation

## 2022-10-11 DIAGNOSIS — E669 Obesity, unspecified: Secondary | ICD-10-CM | POA: Insufficient documentation

## 2022-10-11 DIAGNOSIS — Z8616 Personal history of COVID-19: Secondary | ICD-10-CM | POA: Insufficient documentation

## 2022-10-11 DIAGNOSIS — Z170 Estrogen receptor positive status [ER+]: Secondary | ICD-10-CM | POA: Insufficient documentation

## 2022-10-11 DIAGNOSIS — Z9221 Personal history of antineoplastic chemotherapy: Secondary | ICD-10-CM | POA: Insufficient documentation

## 2022-10-11 DIAGNOSIS — M7989 Other specified soft tissue disorders: Secondary | ICD-10-CM | POA: Insufficient documentation

## 2022-10-11 DIAGNOSIS — C50412 Malignant neoplasm of upper-outer quadrant of left female breast: Secondary | ICD-10-CM | POA: Insufficient documentation

## 2022-10-11 DIAGNOSIS — Z510 Encounter for antineoplastic radiation therapy: Secondary | ICD-10-CM | POA: Insufficient documentation

## 2022-10-11 DIAGNOSIS — Z79899 Other long term (current) drug therapy: Secondary | ICD-10-CM | POA: Insufficient documentation

## 2022-10-11 DIAGNOSIS — Z923 Personal history of irradiation: Secondary | ICD-10-CM | POA: Insufficient documentation

## 2022-10-11 DIAGNOSIS — M2550 Pain in unspecified joint: Secondary | ICD-10-CM | POA: Insufficient documentation

## 2022-10-11 DIAGNOSIS — M549 Dorsalgia, unspecified: Secondary | ICD-10-CM | POA: Insufficient documentation

## 2022-10-11 DIAGNOSIS — C773 Secondary and unspecified malignant neoplasm of axilla and upper limb lymph nodes: Secondary | ICD-10-CM | POA: Insufficient documentation

## 2022-10-11 DIAGNOSIS — C792 Secondary malignant neoplasm of skin: Secondary | ICD-10-CM | POA: Insufficient documentation

## 2022-10-11 DIAGNOSIS — R519 Headache, unspecified: Secondary | ICD-10-CM | POA: Insufficient documentation

## 2022-10-11 DIAGNOSIS — Z8249 Family history of ischemic heart disease and other diseases of the circulatory system: Secondary | ICD-10-CM | POA: Insufficient documentation

## 2022-10-11 DIAGNOSIS — R309 Painful micturition, unspecified: Secondary | ICD-10-CM | POA: Insufficient documentation

## 2022-10-11 HISTORY — DX: Pure hyperglyceridemia: E78.1

## 2022-10-11 LAB — PREGNANCY, URINE: Preg Test, Ur: NEGATIVE

## 2022-10-11 NOTE — Progress Notes (Signed)
[facility] Follow up:    [name], NP [address] [place] [address]   DIAGNOSIS:  Cancer Staging  Malignant neoplasm of upper-outer quadrant of left breast in female, estrogen receptor positive ([place]) Staging form: Breast, AJCC 8th Edition - Clinical stage from 01/29/2020: Stage IIIA (cT2, cN1, cM0, G3, ER+, PR-, HER2-) - Signed by Benay Pike, MD on 06/28/2022 Stage prefix: Initial diagnosis Histologic grading system: 3 grade system - Pathologic stage from 07/15/2020: No Stage Recommended (ypT1b, pN0, cM0, G3, ER-, PR-, HER2-) - Signed by Gardenia Phlegm, NP on 04/01/2022 Stage prefix: Post-therapy Histologic grading system: 3 grade system   SUMMARY OF ONCOLOGIC HISTORY: Oncology History  Malignant neoplasm of upper-outer quadrant of left breast in female, estrogen receptor positive ([place])  01/23/2020 Initial Diagnosis   [place] woman status post left breast upper outer quadrant biopsy 01/23/2020 for a clinical T3 N1, 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             (a) chest CT scan and bone scan 02/07/2020 showed no evidence of metastatic disease   01/29/2020 Cancer Staging   Staging form: Breast, AJCC 8th Edition - Clinical stage from 01/29/2020: Stage IIIA (cT2, cN1, cM0, G3, ER+, PR-, HER2-) - Signed by Benay Pike, MD on 06/28/2022 Stage prefix: Initial diagnosis Histologic grading system: 3 grade system   02/07/2020 Genetic Testing   Negative genetic testing. No pathogenic variants identified on the [facility] Common Hereditary Cancers Panel. VUS in MSH6 called c.2744C>G identified. The report date is 02/07/2020.  The Common Hereditary Cancers Panel offered by [facility] includes sequencing and/or deletion duplication testing of the following 48 genes: APC, ATM, AXIN2, BARD1, BMPR1A, BRCA1, BRCA2, BRIP1, CDH1, CDKN2A (p14ARF), CDKN2A (p16INK4a), CKD4, CHEK2, CTNNA1,  DICER1, EPCAM (Deletion/duplication testing only), GREM1 (promoter region deletion/duplication testing only), KIT, MEN1, MLH1, MSH2, MSH3, MSH6, MUTYH, NBN, NF1, NHTL1, PALB2, PDGFRA, PMS2, POLD1, POLE, PTEN, RAD50, RAD51C, RAD51D, RNF43, SDHB, SDHC, SDHD, SMAD4, SMARCA4. STK11, TP53, TSC1, TSC2, and VHL.  The following genes were evaluated for sequence changes only: SDHA and HOXB13 c.251G>A variant only.   02/12/2020 - 05/28/2020 Neo-Adjuvant Chemotherapy   neoadjuvant chemotherapy consisting of doxorubicin and cyclophosphamide in dose dense fashion x4 started 02/12/2020, completed 03/25/2020, followed by paclitaxel and carboplatin weekly x12 started 04/08/2020 and completed on 05/28/2020             (a) echo 02/07/2020 shows an ejection fraction in the 55-60% range             (b) Epirubicin substituted for Doxorubicin starting with cycle 2 of AC secondary to allergic rash from Doxorubicin   07/15/2020 Surgery   status post left lumpectomy and sentinel lymph node sampling 07/15/2020 for a ypT1b ypN0 residual invasive ductal carcinoma, grade 3, with negative margins.             (a) a total of 4 left axillary lymph nodes were removed             (b) repeat prognostic panel on the final pathology again triple negative, with an MIB-1 of 50%.   07/15/2020 Cancer Staging   Staging form: Breast, AJCC 8th Edition - Pathologic stage from 07/15/2020: No Stage Recommended (ypT1b, pN0, cM0, G3, ER-, PR-, HER2-) - Signed by Gardenia Phlegm, NP on 04/01/2022 Stage prefix: Post-therapy Histologic grading system: 3 grade system   08/26/2020 - 10/09/2020 Radiation Therapy   adjuvant radiation  completed 10/09/2020             (a) sensitizing capecitabine attempted but not tolerated by the patient  08/26/2020 through 10/09/2020 Site Technique Total Dose (Gy) Dose per Fx (Gy) Completed Fx Beam Energies  Breast, Left: Breast_Lt 3D 50/50 2 25/25 15X  Breast, Left: Breast_Lt_PAB_SCV 3D 50/50 2 25/25 10X, 15X   Breast, Left: Breast_Lt_Bst 3D 10/10 2 5/5 6X, 10X    11/23/2020 - 01/04/2021 Adjuvant Chemotherapy   adjuvant pembrolizumab started 11/23/2020, to continue for 1 year             (a) discontinued after 01/04/2021 dose with multiple side effects requiring steroids for resolution   03/30/2022 Mammogram   Mammogram and ultrasound show numerous masses throughout left breast not visualized in 01/2022, +left axillary lymphadenopathy, skin thickening in right breast and in upper abdomen just below sternum   04/05/2022 -  Chemotherapy   Patient is on Treatment Plan : BREAST METASTATIC Fam-Trastuzumab Deruxtecan-nxki (Enhertu) (5.4) q21d     04/06/2022 - 04/26/2022 Chemotherapy   Patient is on Treatment Plan : BREAST METASTATIC fam-trastuzumab deruxtecan-nxki (Enhertu) q21d     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  Prognostic panel sent to [facility]: Left breast skin, punch biopsy: Skin with dermal lymphatic involvement by carcinoma (confirmed with outside CK7 IHC, reviewed). Biomarkers are reported as negative for ER, PR, Her2/neu (confirmed on review but please note sample size is extremely small).   02/15/2022 - 03/08/2022 Chemotherapy    Taxotere/Cytoxan x 2, discontinued due to progression of breast.   03/30/2022 Mammogram   Mammogram and ultrasound show numerous masses throughout left breast not visualized in 01/2022, +left axillary lymphadenopathy, skin thickening in right breast and in upper abdomen just below sternum   04/05/2022 -  Chemotherapy   Patient is on Treatment Plan : BREAST METASTATIC Fam-Trastuzumab Deruxtecan-nxki (Enhertu) (5.4) q21d     04/06/2022 - 04/26/2022 Chemotherapy   Patient is on Treatment Plan : BREAST METASTATIC fam-trastuzumab deruxtecan-nxki (Enhertu) q21d      INTERVAL HISTORY:  Kalika Smay 47 y.o. female returns for follow-up of her breast cancer.   She is now  post surgery, had left mastectomy, lymph axillary excision.  She is healing well although she says surgery like this is tough to recover from. She feels some bone aches, describes as heat sensation all over. She also feels a headache which she has complained from time to time. She overall feels well. 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[place]) 04/01/2022   Encounter for dental examination 03/11/2022   Acute apical periodontitis 03/11/2022   Phobia of dental procedure 03/11/2022   Defective dental restoration 03/11/2022   Accretions on teeth 03/11/2022   Chronic periodontitis 03/11/2022   Teeth missing 03/11/2022   Caries 03/11/2022   Generalized gingival recession 03/11/2022   Pain, dental 03/08/2022   Cancer-related pain 03/08/2022   Port-A-Cath in place 02/15/2022   Oral allergy syndrome, subsequent encounter 02/03/2022   Adverse effect of other drugs, medicaments and biological substances, subsequent encounter 02/02/2022   Other adverse food reactions, not elsewhere classified, subsequent encounter 02/02/2022   Chest tightness 02/02/2022  Other allergic rhinitis 02/02/2022   Allergic conjunctivitis of both eyes 02/02/2022   Language barrier 12/11/2020   Morbid obesity ([place])    History of breast cancer    Genetic testing 02/13/2020   Malignant neoplasm of upper-outer quadrant of left breast in female, estrogen receptor positive ([place]) 01/27/2020   Impaired ability to use community resources due to language barrier 07/13/2013    is allergic to omnipaque [iohexol], doxorubicin hcl, keytruda [pembrolizumab], other, and tape.  MEDICAL HISTORY: Past Medical History:  Diagnosis Date   Allergy    Asthma    Breast cancer in  female [facility])    Left   Breast disorder    breast cancer May 2021   Chronic back pain    Headache    High triglycerides    History of breast cancer    History of COVID-19 04/20/2021   Hx of migraines    Morbid obesity ([place])    Personal history of chemotherapy    Personal history of radiation therapy     SURGICAL HISTORY: Past Surgical History:  Procedure Laterality Date   BREAST LUMPECTOMY     BREAST LUMPECTOMY WITH RADIOACTIVE SEED AND SENTINEL LYMPH NODE BIOPSY Left 07/15/2020   Procedure: LEFT BREAST LUMPECTOMY WITH RADIOACTIVE SEED AND LEFT AXILLARY SENTINEL LYMPH NODE BIOPSY, LEFT AXILLARY NODE RADIOACTIVE SEED GUIDED EXCISION, LEFT BREAST RADIOACTIVE SEED GUIDED EXCISION IM NODE;  Surgeon: Rolm Bookbinder, MD;  Location: [facility];  Service: General;  Laterality: Left;  PEC BLOCK   CESAREAN SECTION WITH BILATERAL TUBAL LIGATION Bilateral 07/23/2015   Procedure: CESAREAN SECTION WITH BILATERAL TUBAL LIGATION;  Surgeon: Donnamae Jude, MD;  Location: [facility];  Service: Obstetrics;  Laterality: Bilateral;   IR IMAGING GUIDED PORT INSERTION  02/11/2022   MODIFIED MASTECTOMY Left 08/22/2022   Procedure: LEFT MODIFIED RADICAL MASTECTOMY, LEFT AXILLARY NODE DISSECTION, AND SKIN BIOPSY OF BACK;  Surgeon: Rolm Bookbinder, MD;  Location: [facility];  Service: General;  Laterality: Left;   PORT-A-CATH REMOVAL N/A 04/30/2021   Procedure: REMOVAL PORT-A-CATH;  Surgeon: Rolm Bookbinder, MD;  Location: [facility];  Service: General;  Laterality: N/A;   PORTACATH PLACEMENT Right 02/11/2020   Procedure: INSERTION PORT-A-CATH WITH ULTRASOUND GUIDANCE;  Surgeon: Rolm Bookbinder, MD;  Location: [facility];  Service: General;  Laterality: Right;   TUBAL LIGATION      SOCIAL HISTORY: Social History   Socioeconomic History   Marital status: Legally Separated    Spouse name: Not on file   Number of children: 3   Years of education: Not on file   Highest education  level: 9th grade  Occupational History   Occupation: Architect  Tobacco Use   Smoking status: Never   Smokeless tobacco: Never  Vaping Use   Vaping Use: Never used  Substance and Sexual Activity   Alcohol use: No   Drug use: Never   Sexual activity: Yes    Birth control/protection: Surgical  Other Topics Concern   Not on file  Social History Narrative   Not on file   Social Determinants of Health   Financial Resource Strain: Not on file  Food Insecurity: No Food Insecurity (10/11/2022)   Hunger Vital Sign    Worried About Running Out of Food in the Last Year: Never true    Ran Out of Food in the Last Year: Never true  Transportation Needs: No Transportation Needs (10/11/2022)   PRAPARE - Transportation    Lack of Transportation (Medical): No    Lack  of Transportation (Non-Medical): No  Physical Activity: Not on file  Stress: Not on file  Social Connections: Not on file  Intimate Partner Violence: Not At Risk (10/11/2022)   Humiliation, Afraid, Rape, and Kick questionnaire    Fear of Current or Ex-Partner: No    Emotionally Abused: No    Physically Abused: No    Sexually Abused: No    FAMILY HISTORY: Family History  Problem Relation Age of Onset   Heart disease Maternal Grandmother     Review of Systems  Constitutional:  Positive for fatigue. Negative for appetite change, chills, fever and unexpected weight change.  HENT:   Negative for hearing loss, lump/mass and trouble swallowing.   Eyes:  Negative for eye problems and icterus.  Respiratory:  Negative for chest tightness, cough and shortness of breath.   Cardiovascular:  Negative for chest pain, leg swelling and palpitations.  Gastrointestinal:  Negative for abdominal distention, abdominal pain, constipation, diarrhea, nausea and vomiting.  Endocrine: Negative for hot flashes.  Genitourinary:  Negative for difficulty urinating.   Musculoskeletal:  Positive for arthralgias.  Skin:  Positive for rash. Negative  for itching.  Neurological:  Positive for headaches. Negative for dizziness, extremity weakness and numbness.  Hematological:  Negative for adenopathy. Does not bruise/bleed easily.  Psychiatric/Behavioral:  Negative for depression. The patient is not nervous/anxious.       PHYSICAL EXAMINATION  ECOG PERFORMANCE STATUS: 1 - Symptomatic but completely ambulatory  Vitals:   10/11/22 1204  BP: 124/80  Pulse: 79  Resp: 16  Temp: 97.7 F (36.5 C)  SpO2: 100%    Physical Exam Constitutional:      Appearance: Normal appearance.  Pulmonary:     Effort: Pulmonary effort is normal.     Breath sounds: Normal breath sounds.  Chest:     Comments: She is s.p left mastectomy. Surgical scar healing well.  Abdominal:     General: Abdomen is flat.     Palpations: Abdomen is soft.  Musculoskeletal:        General: No swelling. Normal range of motion.     Cervical back: Normal range of motion and neck supple. No rigidity.  Lymphadenopathy:     Cervical: No cervical adenopathy.  Neurological:     Mental Status: She is alert.      LABORATORY DATA:  None today  RADIOGRAPHIC STUDIES:  No results found.    ASSESSMENT and THERAPY PLAN:   Malignant neoplasm of upper-outer quadrant of left breast in female, estrogen receptor positive ([place]) Chandrika is a 47 year old Spanish-speaking woman who has a 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  She was most recently seen for ongoing breast redness, given a course of antibiotics but had persistent redness hence we proceeded with a punch biopsy.  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  Pathology was sent to [facility] for second opinion as well. This confirmed triple negative breast cancer.  She progressed on after 2 cycles of TC hence we have switched her treatment to Enhertu.   Repeat biopsy demonstrated her HER2 to be  1+. After her first infusion, she came back to the clinic with increasing redness history of treated empirically for cellulitis, gave her a dose of IV vancomycin, oral doxycycline Given locally advanced and unresectable breast cancer and the progression on TC and previous exposure to Adriamycin based regimen, we decided to try Enhertu for HER2 low disease and she  had robust response.  She is now status post left mastectomy and axillary resection and there is no evidence of residual tumor, complete pathologic response obtained.  Since this is a recurrent breast cancer and high risk for systemic disease, I have discussed about adjuvant Xeloda based on the create X trial I will start her on adjuvant Xeloda after completion of radiation.  Once again we discussed the excellent response achieved so far, role of antiestrogen therapy like tamoxifen as well as adjuvant chemotherapy leg Xeloda.  She is very motivated to try everything she can to reduce the chance of this happening again.  I will plan to see her back in about 4 to 6 weeks.  All her questions were answered to the best my knowledge.   All questions were answered. The patient knows to call the clinic with any problems, questions or concerns. We can certainly see the patient much sooner if necessary.  Total time spent: 30 minutes.  Certified Spanish interpreter was used for the entirety of this conversation  *Total Encounter Time as defined by the Centers for Medicare and Medicaid Services includes, in addition to the face-to-face time of a patient visit (documented in the note above) non-face-to-face time: obtaining and reviewing outside history, ordering and reviewing medications, tests or procedures, care coordination (communications with other health care professionals or caregivers) and documentation in the medical record.

## 2022-10-11 NOTE — Assessment & Plan Note (Signed)
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  She was most recently seen for ongoing breast redness, given a course of antibiotics but had persistent redness hence we proceeded with a punch biopsy.  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  Pathology was sent to [facility] for second opinion as well. This confirmed triple negative breast cancer.  She progressed on after 2 cycles of TC hence we have switched her treatment to Enhertu.   Repeat biopsy demonstrated her HER2 to be 1+. After her first infusion, she came back to the clinic with increasing redness history of treated empirically for cellulitis, gave her a dose of IV vancomycin, oral doxycycline Given locally advanced and unresectable breast cancer and the progression on TC and previous exposure to Adriamycin based regimen, we decided to try Enhertu for HER2 low disease and she had robust response.  She is now status post left mastectomy and axillary resection and there is no evidence of residual tumor, complete pathologic response obtained.  Since this is a recurrent breast cancer and high risk for systemic disease, I have discussed about adjuvant Xeloda based on the create X trial I will start her on adjuvant Xeloda after completion of radiation.  Once again we discussed the excellent response achieved so far, role of antiestrogen therapy like tamoxifen as well as adjuvant chemotherapy leg Xeloda.  She is very motivated to try everything she can to reduce the chance of this happening again.  I will plan to see her back in about 4 to 6 weeks.  All her questions were answered to the best my knowledge.

## 2022-10-11 NOTE — Therapy (Incomplete)
OUTPATIENT PHYSICAL THERAPY ONCOLOGY EVALUATION  Patient Name: [name] MRN: [id number] DOB:1975/11/19, 47 y.o., female Today's Date: 10/11/2022  END OF SESSION:   Past Medical History:  Diagnosis Date   Allergy    Asthma    Breast cancer in female [facility])    Left   Breast disorder    breast cancer May 2021   Chronic back pain    Headache    High triglycerides    History of breast cancer    History of COVID-19 04/20/2021   Hx of migraines    Morbid obesity ([place])    Personal history of chemotherapy    Personal history of radiation therapy    Past Surgical History:  Procedure Laterality Date   BREAST LUMPECTOMY     BREAST LUMPECTOMY WITH RADIOACTIVE SEED AND SENTINEL LYMPH NODE BIOPSY Left 07/15/2020   Procedure: LEFT BREAST LUMPECTOMY WITH RADIOACTIVE SEED AND LEFT AXILLARY SENTINEL LYMPH NODE BIOPSY, LEFT AXILLARY NODE RADIOACTIVE SEED GUIDED EXCISION, LEFT BREAST RADIOACTIVE SEED GUIDED EXCISION IM NODE;  Surgeon: Rolm Bookbinder, MD;  Location: [place];  Service: General;  Laterality: Left;  PEC BLOCK   CESAREAN SECTION WITH BILATERAL TUBAL LIGATION Bilateral 07/23/2015   Procedure: CESAREAN SECTION WITH BILATERAL TUBAL LIGATION;  Surgeon: Donnamae Jude, MD;  Location: [place] [facility];  Service: Obstetrics;  Laterality: Bilateral;   IR IMAGING GUIDED PORT INSERTION  02/11/2022   MODIFIED MASTECTOMY Left 08/22/2022   Procedure: LEFT MODIFIED RADICAL MASTECTOMY, LEFT AXILLARY NODE DISSECTION, AND SKIN BIOPSY OF BACK;  Surgeon: Rolm Bookbinder, MD;  Location: [facility];  Service: General;  Laterality: Left;   PORT-A-CATH REMOVAL N/A 04/30/2021   Procedure: REMOVAL PORT-A-CATH;  Surgeon: Rolm Bookbinder, MD;  Location: [place];  Service: General;  Laterality: N/A;   PORTACATH PLACEMENT Right 02/11/2020   Procedure: INSERTION PORT-A-CATH WITH ULTRASOUND GUIDANCE;  Surgeon: Rolm Bookbinder, MD;  Location: [place];  Service: General;   Laterality: Right;   TUBAL LIGATION     Patient Active Problem List   Diagnosis Date Noted   Health care maintenance 09/30/2022   S/P left mastectomy 08/22/2022   Burning with urination 06/01/2022   Swelling of left upper extremity 05/18/2022   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[place]) 04/01/2022   Encounter for dental examination 03/11/2022   Acute apical periodontitis 03/11/2022   Phobia of dental procedure 03/11/2022   Defective dental restoration 03/11/2022   Accretions on teeth 03/11/2022   Chronic periodontitis 03/11/2022   Teeth missing 03/11/2022   Caries 03/11/2022   Generalized gingival recession 03/11/2022   Pain, dental 03/08/2022   Cancer-related pain 03/08/2022   Port-A-Cath in place 02/15/2022   Oral allergy syndrome, subsequent encounter 02/03/2022   Adverse effect of other drugs, medicaments and biological substances, subsequent encounter 02/02/2022   Other adverse food reactions, not elsewhere classified, subsequent encounter 02/02/2022   Chest tightness 02/02/2022   Other allergic rhinitis 02/02/2022   Allergic conjunctivitis of both eyes 02/02/2022   Language barrier 12/11/2020   Morbid obesity ([place])    History of breast cancer    Genetic testing 02/13/2020   Malignant neoplasm of upper-outer quadrant of left breast in female, estrogen receptor positive ([place]) 01/27/2020   Impaired ability to use community resources due to language barrier 07/13/2013    PCP: ***  REFERRING PROVIDER: Benay Pike, MD  REFERRING DIAG: Left UE lymphedema  THERAPY DIAG:  No diagnosis found.  ONSET DATE: ***  Rationale for Evaluation and Treatment: Rehabilitation  SUBJECTIVE:  SUBJECTIVE STATEMENT:  PERTINENT HISTORY:   Patient was diagnosed on 01/23/2020 with left grade III invasive  ductal carcinoma breast cancer. It is ER positive, PR negative, and HER2 negative , but functionally Triple Negative,with a Ki67 of 40%. Patient with interpreter present. She underwent neoadjuvant chemotherapy from 02/12/2020 - 05/28/2020. 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 She underwent a left lumpectomy and sentinel node biopsy ( 4 negative nodes) on 07/15/2020. She underwent radiation and anti-estrogen therapy.  She had interval development of diffuse skin thickening in 2023. She had a punch biopsy of her skin at that time and this was consistent with inflammatory breast cancer. She has been treated with chemo since then. .  She had a left modified radical mastectomy on 08/22/2022 with complete therapeutic response and 0/1 LN. She noted some swelling in her left arm and on 08/25/2022 she had an [place] that was negative for DVT. Her SOZO screen on 09/19/2021 was 6.9 points above baseline putting her in the yellow zone and she was given a compression sleeve.  PAIN:  Are you having pain? {yes/no:20286} NPRS scale: ***/10 Pain location: *** Pain orientation: {Pain Orientation:25161}  PAIN TYPE: {type:313116} Pain description: {PAIN DESCRIPTION:21022940}  Aggravating factors: *** Relieving factors: ***  PRECAUTIONS: {Therapy precautions:24002}  WEIGHT BEARING RESTRICTIONS: {Yes ***/No:24003}  FALLS:  Has patient fallen in last 6 months? {fallsyesno:27318}  LIVING ENVIRONMENT: Lives with: {[facility] lives with:25569::"lives with their family"} Lives in: {Lives in:25570} Stairs: {yes/no:20286}; {Stairs:24000} Has following equipment at home: {Assistive devices:23999}  OCCUPATION: ***  LEISURE: ***  HAND DOMINANCE: right   PRIOR LEVEL OF FUNCTION: Independent  PATIENT GOALS: ***   OBJECTIVE:  COGNITION: Overall cognitive status: Within functional limits for tasks assessed   PALPATION: ***  OBSERVATIONS / OTHER ASSESSMENTS: ***  SENSATION: Light touch:  {intact/deficits:24005}   POSTURE: forward head, rounded shoulders  UPPER EXTREMITY AROM/PROM:  A/PROM RIGHT   eval   Shoulder extension   Shoulder flexion   Shoulder abduction   Shoulder internal rotation   Shoulder external rotation     (Blank rows = not tested)  A/PROM LEFT   eval  Shoulder extension   Shoulder flexion   Shoulder abduction   Shoulder internal rotation   Shoulder external rotation     (Blank rows = not tested)  CERVICAL AROM: All within functional limits:   UPPER EXTREMITY STRENGTH: ***     LYMPHEDEMA ASSESSMENTS:   SURGERY TYPE/DATE: Left Lumpectomy with SLNB11/11/2019, Left modified mastectomy with left axillary node dissection  NUMBER OF LYMPH NODES REMOVED: 0/4 with lumpectomy, 0/1 during Mastectomy  CHEMOTHERAPY: yes  RADIATION:Yes  HORMONE TREATMENT: ***  INFECTIONS: ***  LYMPHEDEMA ASSESSMENTS:   LANDMARK RIGHT  eval  10 cm proximal to olecranon process   Olecranon process   10 cm proximal to ulnar styloid process   Just proximal to ulnar styloid process   Across hand at thumb web space   At base of 2nd digit   (Blank rows = not tested)  LANDMARK LEFT  eval  10 cm proximal to olecranon process   Olecranon process   10 cm proximal to ulnar styloid process   Just proximal to ulnar styloid process   Across hand at thumb web space   At base of 2nd digit   (Blank rows = not tested)      QUICK DASH SURVEY: ***   TODAY'S TREATMENT:  DATE: ***  PATIENT EDUCATION:  Education details: *** Person educated: {Person educated:25204} Education method: {Education Method:25205} Education comprehension: {Education Comprehension:25206}  HOME EXERCISE PROGRAM: ***  ASSESSMENT:  CLINICAL IMPRESSION: Patient is a 47 y.o. female who was seen today for physical therapy evaluation and  treatment for left UE lymphedema with swelling noted by pt several days after surgery and a negative doppler. She is post  Left lumpectomy with SLNB in 2021, and a modified radical mastectomy on 08/22/2022 for inflammatory breast cancer with a complete therapeutic response. Her recent SOZO screen put pt 6.9 points above baseline.   OBJECTIVE IMPAIRMENTS: {opptimpairments:25111}.   ACTIVITY LIMITATIONS: {activitylimitations:27494}  PARTICIPATION LIMITATIONS: {participationrestrictions:25113}  PERSONAL FACTORS: {Personal factors:25162} are also affecting patient's functional outcome.   REHAB POTENTIAL: {rehabpotential:25112}  CLINICAL DECISION MAKING: {clinical decision making:25114}  EVALUATION COMPLEXITY: {Evaluation complexity:25115}  GOALS: Goals reviewed with patient? {yes/no:20286}  SHORT TERM GOALS: Target date: ***  *** Baseline: Goal status: {GOALSTATUS:25110}  2.  *** Baseline:  Goal status: {GOALSTATUS:25110}  3.  *** Baseline:  Goal status: {GOALSTATUS:25110}    LONG TERM GOALS: Target date: ***  *** Baseline:  Goal status: {GOALSTATUS:25110}  2.  *** Baseline:  Goal status: {GOALSTATUS:25110}  3.  *** Baseline:  Goal status: {GOALSTATUS:25110}   PLAN:  PT FREQUENCY: {rehab frequency:25116}  PT DURATION: {rehab duration:25117}  PLANNED INTERVENTIONS: {rehab planned interventions:25118::"Therapeutic exercises","Therapeutic activity","Neuromuscular re-education","Balance training","Gait training","Patient/Family education","Self Care","Joint mobilization"}  PLAN FOR NEXT SESSION: ***   Claris Pong, PT 10/11/2022, 5:33 PM

## 2022-10-12 ENCOUNTER — Other Ambulatory Visit: Payer: Self-pay

## 2022-10-12 ENCOUNTER — Ambulatory Visit: Payer: No Typology Code available for payment source | Attending: Hematology and Oncology | Admitting: Physical Therapy

## 2022-10-12 ENCOUNTER — Encounter: Payer: Self-pay | Admitting: Physical Therapy

## 2022-10-12 ENCOUNTER — Encounter: Payer: Self-pay | Admitting: Radiation Oncology

## 2022-10-12 DIAGNOSIS — R293 Abnormal posture: Secondary | ICD-10-CM

## 2022-10-12 DIAGNOSIS — C50412 Malignant neoplasm of upper-outer quadrant of left female breast: Secondary | ICD-10-CM

## 2022-10-12 DIAGNOSIS — I972 Postmastectomy lymphedema syndrome: Secondary | ICD-10-CM

## 2022-10-12 DIAGNOSIS — Z483 Aftercare following surgery for neoplasm: Secondary | ICD-10-CM

## 2022-10-12 DIAGNOSIS — Z170 Estrogen receptor positive status [ER+]: Secondary | ICD-10-CM | POA: Insufficient documentation

## 2022-10-12 DIAGNOSIS — M25612 Stiffness of left shoulder, not elsewhere classified: Secondary | ICD-10-CM

## 2022-10-12 NOTE — Therapy (Signed)
OUTPATIENT PHYSICAL THERAPY ONCOLOGY EVALUATION  Patient Name: [name] MRN: [id number] DOB:1976-01-02, 47 y.o., female Today's Date: 10/12/2022  END OF SESSION:  PT End of Session - 10/12/22 1457     Visit Number 1    Number of Visits 9    Date for PT Re-Evaluation 11/09/22    PT Start Time 3149    PT Stop Time 7026    PT Time Calculation (min) 52 min    Activity Tolerance Patient tolerated treatment well    Behavior During Therapy [facility] for tasks assessed/performed             Past Medical History:  Diagnosis Date   Allergy    Asthma    Breast cancer in female [facility])    Left   Breast disorder    breast cancer May 2021   Chronic back pain    Headache    High triglycerides    History of breast cancer    History of COVID-19 04/20/2021   Hx of migraines    Morbid obesity ([place])    Personal history of chemotherapy    Personal history of radiation therapy    Past Surgical History:  Procedure Laterality Date   BREAST LUMPECTOMY     BREAST LUMPECTOMY WITH RADIOACTIVE SEED AND SENTINEL LYMPH NODE BIOPSY Left 07/15/2020   Procedure: LEFT BREAST LUMPECTOMY WITH RADIOACTIVE SEED AND LEFT AXILLARY SENTINEL LYMPH NODE BIOPSY, LEFT AXILLARY NODE RADIOACTIVE SEED GUIDED EXCISION, LEFT BREAST RADIOACTIVE SEED GUIDED EXCISION IM NODE;  Surgeon: Rolm Bookbinder, MD;  Location: [place];  Service: General;  Laterality: Left;  PEC BLOCK   CESAREAN SECTION WITH BILATERAL TUBAL LIGATION Bilateral 07/23/2015   Procedure: CESAREAN SECTION WITH BILATERAL TUBAL LIGATION;  Surgeon: Donnamae Jude, MD;  Location: [place] [facility];  Service: Obstetrics;  Laterality: Bilateral;   IR IMAGING GUIDED PORT INSERTION  02/11/2022   MODIFIED MASTECTOMY Left 08/22/2022   Procedure: LEFT MODIFIED RADICAL MASTECTOMY, LEFT AXILLARY NODE DISSECTION, AND SKIN BIOPSY OF BACK;  Surgeon: Rolm Bookbinder, MD;  [facility];  Service: General;  Laterality: Left;   PORT-A-CATH REMOVAL N/A 04/30/2021    Procedure: REMOVAL PORT-A-CATH;  Surgeon: Rolm Bookbinder, MD;  Location: [place];  Service: General;  Laterality: N/A;   PORTACATH PLACEMENT Right 02/11/2020   Procedure: INSERTION PORT-A-CATH WITH ULTRASOUND GUIDANCE;  Surgeon: Rolm Bookbinder, MD;  Location: [place];  Service: General;  Laterality: Right;   TUBAL LIGATION     Patient Active Problem List   Diagnosis Date Noted   Health care maintenance 09/30/2022   S/P left mastectomy 08/22/2022   Burning with urination 06/01/2022   Swelling of left upper extremity 05/18/2022   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[place]) 04/01/2022   Encounter for dental examination 03/11/2022   Acute apical periodontitis 03/11/2022   Phobia of dental procedure 03/11/2022   Defective dental restoration 03/11/2022   Accretions on teeth 03/11/2022   Chronic periodontitis 03/11/2022   Teeth missing 03/11/2022   Caries 03/11/2022   Generalized gingival recession 03/11/2022   Pain, dental 03/08/2022   Cancer-related pain 03/08/2022   Port-A-Cath in place 02/15/2022   Oral allergy syndrome, subsequent encounter 02/03/2022   Adverse effect of other drugs, medicaments and biological substances, subsequent encounter 02/02/2022   Other adverse food reactions, not elsewhere classified, subsequent encounter 02/02/2022   Chest tightness 02/02/2022   Other allergic rhinitis 02/02/2022   Allergic conjunctivitis of both eyes 02/02/2022   Language barrier 12/11/2020   Morbid  obesity ([place])    History of breast cancer    Genetic testing 02/13/2020   Malignant neoplasm of upper-outer quadrant of left breast in female, estrogen receptor positive ([place]) 01/27/2020   Impaired ability to use community resources due to language barrier 07/13/2013    PCP: Lazaro Arms, NP  REFERRING PROVIDER: Benay Pike, MD  REFERRING DIAG: Left UE lymphedema  THERAPY DIAG:  Stiffness of left shoulder, not elsewhere  classified  Postmastectomy lymphedema  Aftercare following surgery for neoplasm  Abnormal posture  Malignant neoplasm of upper-outer quadrant of left breast in female, estrogen receptor positive ([place])  ONSET DATE: 08/01/22  Rationale for Evaluation and Treatment: Rehabilitation  SUBJECTIVE:                                                                                                                                                                                           SUBJECTIVE STATEMENT: I have been having arm swelling and I can not tell any decrease in swelling since wearing the sleeve and glove. I have been wearing the sleeve for 10 hours every day. She then underwent a L mastectomy on 08/22/22 with 1 node removed and that worsened the swelling. I will start radiation on Oct 19, 2022.  PERTINENT HISTORY:   Patient was diagnosed on 01/23/2020 with left grade III invasive ductal carcinoma breast cancer. It is ER positive, PR negative, and HER2 negative , but functionally Triple Negative,with a Ki67 of 40%. Patient with interpreter present. She underwent neoadjuvant chemotherapy from 02/12/2020 - 05/28/2020. 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 She underwent a left lumpectomy and sentinel node biopsy ( 4 negative nodes) on 07/15/2020. She underwent radiation and anti-estrogen therapy.  She had interval development of diffuse skin thickening in 2023. She had a punch biopsy of her skin at that time and this was consistent with inflammatory breast cancer. She has been treated with chemo since then. .  She had a left modified radical mastectomy on 08/22/2022 with complete therapeutic response and 0/1 LN. She noted some swelling in her left arm and on 08/25/2022 she had an [place] that was negative for DVT. Her SOZO screen on 09/19/2021 was 6.9 points above baseline putting her in the yellow zone and she was given a compression sleeve.  PAIN:  Are you having pain? No pt repots she  does have pain with L UE movement   PRECAUTIONS: Other: lymphedema [name]  WEIGHT BEARING RESTRICTIONS: No  FALLS:  Has patient fallen in last 6 months? 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  LEISURE: pt does not exercise  HAND DOMINANCE: right   PRIOR LEVEL OF FUNCTION: Independent  PATIENT GOALS: get the swelling down, improve L shoulder ROM, decrease pain   OBJECTIVE:  COGNITION: Overall cognitive status: Within functional limits for tasks assessed   PALPATION: Decreased scar mobility  OBSERVATIONS / OTHER ASSESSMENTS: scar well healed, increased edema at lateral trunk and throughout [name]  POSTURE: forward head, rounded shoulders  UPPER EXTREMITY AROM/PROM:  A/PROM RIGHT   eval   Shoulder extension 68  Shoulder flexion 160  Shoulder abduction 175  Shoulder internal rotation 56  Shoulder external rotation 88    (Blank rows = not tested)  A/PROM LEFT   eval  Shoulder extension 52  Shoulder flexion 146  Shoulder abduction 148  Shoulder internal rotation 67  Shoulder external rotation 86    (Blank rows = not tested)   UPPER EXTREMITY STRENGTH: 5/5     LYMPHEDEMA ASSESSMENTS:   SURGERY TYPE/DATE: Left Lumpectomy with SLNB11/11/2019, Left modified mastectomy with left axillary node dissection  NUMBER OF LYMPH NODES REMOVED: 0/4 with lumpectomy, 0/1 during Mastectomy  CHEMOTHERAPY: yes  RADIATION:Yes  HORMONE TREATMENT: may require  INFECTIONS: none  LYMPHEDEMA ASSESSMENTS:   LANDMARK RIGHT  eval  10 cm proximal to olecranon process 31.4  Olecranon process 27.2  10 cm proximal to ulnar styloid process 25.7  Just  proximal to ulnar styloid process 18.4  Across hand at thumb web space 20  At base of 2nd digit 6.4  (Blank rows = not tested)  LANDMARK LEFT  eval  10 cm proximal to olecranon process 33  Olecranon process 28.4  10 cm proximal to ulnar styloid process 26  Just proximal to ulnar styloid process 19.4  Across hand at thumb web space 21  At base of 2nd digit 6.7  (Blank rows = not tested)     TODAY'S TREATMENT:                                                                                                                                         DATE:  10/12/22: see education section  PATIENT EDUCATION:  Education details: lymphedema, anatomy and physiology of the lymphatic system, how lymphedema is treated, subclinical lymphedema vs lymphedema, need for compression bra Person educated: Patient Education method: Explanation Education comprehension: verbalized understanding  HOME EXERCISE PROGRAM: Obtain compression bra, continue to wear sleeve and glove  ASSESSMENT:  CLINICAL IMPRESSION: Patient is a 47 y.o. female who was seen today for physical therapy evaluation and treatment for left UE lymphedema with swelling noted by pt several days after surgery and a negative doppler. She is post  Left lumpectomy with SLNB in 2021, and a modified radical mastectomy on 08/22/2022 for inflammatory breast cancer with a complete therapeutic response. She has decreased L shoulder ROM and pain and discomfort in L axilla with L shoulder ROM. She has increased edema at L lateral trunk. She currently does not have a compression bra and was educated on how to obtain one. Her recent SOZO screen put pt 6.9 points above baseline. Pt would benefit from skilled PT services to decrease [name] and lateral trunk lymphedema, improve L shoulder ROM, improve scar mobility and progress pt towards independence with a home exercise program.   OBJECTIVE IMPAIRMENTS: decreased knowledge of condition, decreased ROM,  decreased strength, increased edema, increased fascial restrictions, impaired UE functional use, postural dysfunction, and pain.   ACTIVITY LIMITATIONS: carrying, lifting, and reach over head  PARTICIPATION LIMITATIONS: cleaning, laundry, community activity, occupation, and yard work  PERSONAL FACTORS: Time since onset of injury/illness/exacerbation are also affecting patient's functional outcome.   REHAB POTENTIAL: Good  CLINICAL DECISION MAKING: Stable/uncomplicated  EVALUATION COMPLEXITY: Low  GOALS: Goals reviewed with patient? Yes   SHORT TERM GOALS = LONG TERM GOALS: Target date: 11/09/22  Pt will be independent in self MLD for long term management of lymphedema.  Baseline:  Goal status: INITIAL  2.  Pt will obtain appropriate compression garments for long term management of lymphedema.  Baseline:  Goal status: INITIAL  3.  Pt will demonstrate 160 degrees of L shoulder flexion to allow her to reach overhead. Baseline:  Goal status: INITIAL  4. Pt will demonstrate 170 degrees of L shoulder abduction to allow her to reach out to the side.   Goal: INITIAL  5. Pt will be independent in a home exercise program for continued stretching and strengthening.  Goal: INITIAL  6. Pt will report she is able to raise her L arm to end range wihtout increased pain in her axilla to allow improved comfort.   GOAL: INITIAL   PLAN:  PT FREQUENCY: 2x/week  PT DURATION: 4 weeks  PLANNED INTERVENTIONS: Therapeutic exercises, Therapeutic activity, Patient/Family education, Self Care, Joint mobilization, Manual lymph drainage, Compression bandaging, scar mobilization, Taping, Vasopneumatic device, and Manual therapy  PLAN FOR NEXT SESSION: begin MLD to L UE and lateral trunk and instruct pt, PROM for L UE, eventually ball and pulleys   [employer], PT 10/12/2022, 5:08 PM

## 2022-10-14 ENCOUNTER — Ambulatory Visit: Payer: No Typology Code available for payment source | Attending: Hematology and Oncology | Admitting: Rehabilitation

## 2022-10-14 ENCOUNTER — Encounter: Payer: Self-pay | Admitting: Hematology and Oncology

## 2022-10-14 ENCOUNTER — Encounter: Payer: Self-pay | Admitting: Rehabilitation

## 2022-10-14 DIAGNOSIS — I972 Postmastectomy lymphedema syndrome: Secondary | ICD-10-CM | POA: Insufficient documentation

## 2022-10-14 DIAGNOSIS — C50412 Malignant neoplasm of upper-outer quadrant of left female breast: Secondary | ICD-10-CM | POA: Insufficient documentation

## 2022-10-14 DIAGNOSIS — Z170 Estrogen receptor positive status [ER+]: Secondary | ICD-10-CM | POA: Insufficient documentation

## 2022-10-14 DIAGNOSIS — R293 Abnormal posture: Secondary | ICD-10-CM | POA: Insufficient documentation

## 2022-10-14 DIAGNOSIS — M25612 Stiffness of left shoulder, not elsewhere classified: Secondary | ICD-10-CM | POA: Insufficient documentation

## 2022-10-14 DIAGNOSIS — Z483 Aftercare following surgery for neoplasm: Secondary | ICD-10-CM | POA: Insufficient documentation

## 2022-10-14 NOTE — Patient Instructions (Signed)
Deep Effective Breath   Standing, sitting, or laying down, place both hands on the belly. Take a deep breath IN, expanding the belly; then breath OUT, contracting the belly. Repeat __5__ times.   http://gt2.exer.us/866   Copyright  VHI. All rights reserved.  Axilla to Axilla - Sweep   On both sides make 5 circles in the armpits,   Pump _5__ times from involved armpit across chest to uninvolved armpit, making a pathway. (Left to Right)   Copyright  VHI. All rights reserved.  Axilla to Inguinal Nodes - Sweep   On the left side, make 5 circles at groin at panty line,   then pump _5__ times from armpit along side of trunk to outer hip, making your other pathway.   Copyright  VHI. All rights reserved.  Arm Posterior: Elbow to Shoulder - Sweep   Pump _5__ times from back of elbow to top of shoulder.   Then inner to outer upper arm _5_ times, then outer arm again _5_ times. Then back to the pathways _2-3_ times. Do _1__ time per day.  Copyright  VHI. All rights reserved.  ARM: [place] Wrist to Elbow - Sweep   Pump or stationary circles _5__ times from wrist to elbow making sure to do both sides of the forearm. Then retrace your steps to the outer arm, and the pathways _2-3_ times each. Do _1__ time per day.  Copyright  VHI. All rights reserved.  ARM: Dorsum of Hand to Shoulder - Sweep   Pump or stationary circles _5__ times on back of hand including knuckle spaces and individual fingers if needed working up towards the wrist, then retrace all your steps working back up the forearm, doing both sides; upper outer arm and back to your pathways _2-3_ times each. Then do 5 circles again at uninvolved armpit and involved groin where you started! Good job!! Do __1_ time per day.  Copyright  VHI. All rights reserved.

## 2022-10-14 NOTE — Therapy (Signed)
OUTPATIENT PHYSICAL THERAPY ONCOLOGY TREATMENT  Patient Name: [name] MRN: [id number] DOB:[birth year], 47 y.o., female Today's Date: 10/14/2022  END OF SESSION:  PT End of Session - 10/14/22 0851     Visit Number 2    Number of Visits 9    Date for PT Re-Evaluation 11/09/22    PT Start Time 0802    PT Stop Time 8366    PT Time Calculation (min) 51 min    Activity Tolerance Patient tolerated treatment well    Behavior During Therapy [facility] for tasks assessed/performed              Past Medical History:  Diagnosis Date   Allergy    Asthma    Breast cancer in female [facility])    Left   Breast disorder    breast cancer May 2021   Chronic back pain    Headache    High triglycerides    History of breast cancer    History of COVID-19 04/20/2021   Hx of migraines    Morbid obesity ([place])    Personal history of chemotherapy    Personal history of radiation therapy    Past Surgical History:  Procedure Laterality Date   BREAST LUMPECTOMY     BREAST LUMPECTOMY WITH RADIOACTIVE SEED AND SENTINEL LYMPH NODE BIOPSY Left 07/15/2020   Procedure: LEFT BREAST LUMPECTOMY WITH RADIOACTIVE SEED AND LEFT AXILLARY SENTINEL LYMPH NODE BIOPSY, LEFT AXILLARY NODE RADIOACTIVE SEED GUIDED EXCISION, LEFT BREAST RADIOACTIVE SEED GUIDED EXCISION IM NODE;  Surgeon: Rolm Bookbinder, MD;  Location: [place];  Service: General;  Laterality: Left;  PEC BLOCK   CESAREAN SECTION WITH BILATERAL TUBAL LIGATION Bilateral 07/23/2015   Procedure: CESAREAN SECTION WITH BILATERAL TUBAL LIGATION;  Surgeon: Donnamae Jude, MD;  Location: [facility];  Service: Obstetrics;  Laterality: Bilateral;   IR IMAGING GUIDED PORT INSERTION  02/11/2022   MODIFIED MASTECTOMY Left 08/22/2022   Procedure: LEFT MODIFIED RADICAL MASTECTOMY, LEFT AXILLARY NODE DISSECTION, AND SKIN BIOPSY OF BACK;  Surgeon: Rolm Bookbinder, MD;  Location: [facility];  Service: General;  Laterality: Left;   PORT-A-CATH REMOVAL N/A 04/30/2021    Procedure: REMOVAL PORT-A-CATH;  Surgeon: Rolm Bookbinder, MD;  Location: [place];  Service: General;  Laterality: N/A;   PORTACATH PLACEMENT Right 02/11/2020   Procedure: INSERTION PORT-A-CATH WITH ULTRASOUND GUIDANCE;  Surgeon: Rolm Bookbinder, MD;  Location: [place];  Service: General;  Laterality: Right;   TUBAL LIGATION     Patient Active Problem List   Diagnosis Date Noted   Health care maintenance 09/30/2022   S/P left mastectomy 08/22/2022   Burning with urination 06/01/2022   Swelling of left upper extremity 05/18/2022   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[place]) 04/01/2022   Encounter for dental examination 03/11/2022   Acute apical periodontitis 03/11/2022   Phobia of dental procedure 03/11/2022   Defective dental restoration 03/11/2022   Accretions on teeth 03/11/2022   Chronic periodontitis 03/11/2022   Teeth missing 03/11/2022   Caries 03/11/2022   Generalized gingival recession 03/11/2022   Pain, dental 03/08/2022   Cancer-related pain 03/08/2022   Port-A-Cath in place 02/15/2022   Oral allergy syndrome, subsequent encounter 02/03/2022   Adverse effect of other drugs, medicaments and biological substances, subsequent encounter 02/02/2022   Other adverse food reactions, not elsewhere classified, subsequent encounter 02/02/2022   Chest tightness 02/02/2022   Other allergic rhinitis 02/02/2022   Allergic conjunctivitis of both eyes 02/02/2022   Language barrier 12/11/2020  Morbid obesity ([place])    History of breast cancer    Genetic testing 02/13/2020   Malignant neoplasm of upper-outer quadrant of left breast in female, estrogen receptor positive ([place]) 01/27/2020   Impaired ability to use community resources due to language barrier 07/13/2013    PCP: Lazaro Arms, NP  REFERRING PROVIDER: Benay Pike, MD  REFERRING DIAG: Left UE lymphedema  THERAPY DIAG:  Stiffness of left shoulder, not elsewhere  classified  Postmastectomy lymphedema  Aftercare following surgery for neoplasm  Abnormal posture  Malignant neoplasm of upper-outer quadrant of left breast in female, estrogen receptor positive ([place])  ONSET DATE: 08/01/22  Rationale for Evaluation and Treatment: Rehabilitation  SUBJECTIVE:                                                                                                                                                                                           SUBJECTIVE STATEMENT: I think the swelling is a bit better, maybe the sleeve is a bit tight and the hand a bit loose   PERTINENT HISTORY:  Patient was diagnosed on 01/23/2020 with left grade III invasive ductal carcinoma breast cancer. It is ER positive, PR negative, and HER2 negative , but functionally Triple Negative,with a Ki67 of 40%. Patient with interpreter present. She underwent neoadjuvant chemotherapy from 02/12/2020 - 05/28/2020. 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 She underwent a left lumpectomy and sentinel node biopsy ( 4 negative nodes) on 07/15/2020. She underwent radiation and anti-estrogen therapy.  She had interval development of diffuse skin thickening in 2023. She had a punch biopsy of her skin at that time and this was consistent with inflammatory breast cancer. She has been treated with chemo since then. .  She had a left modified radical mastectomy on 08/22/2022 with complete therapeutic response and 0/1 LN. She noted some swelling in her left arm and on 08/25/2022 she had an [place] that was negative for DVT. Her SOZO screen on 09/19/2021 was 6.9 points above baseline putting her in the yellow zone and she was given a compression sleeve.  PAIN:  Are you having pain? No pt repots she does have pain with L UE movement   PRECAUTIONS: Other: lymphedema [name]  WEIGHT BEARING RESTRICTIONS: No  FALLS:  Has patient fallen in last 6 months? 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  LEISURE: pt does not exercise  HAND DOMINANCE: right   PRIOR LEVEL OF FUNCTION: Independent  PATIENT GOALS: get the swelling down, improve L shoulder ROM, decrease pain   OBJECTIVE:  COGNITION: Overall cognitive status: Within functional limits for tasks assessed   PALPATION: Decreased scar mobility  OBSERVATIONS / OTHER ASSESSMENTS: scar well healed, increased edema at lateral trunk and throughout [name]  POSTURE: forward head, rounded shoulders  UPPER EXTREMITY AROM/PROM:  A/PROM RIGHT   eval   Shoulder extension 68  Shoulder flexion 160  Shoulder abduction 175  Shoulder internal rotation 56  Shoulder external rotation 88    (Blank rows = not tested)  A/PROM LEFT   eval  Shoulder extension 52  Shoulder flexion 146  Shoulder abduction 148  Shoulder internal rotation 67  Shoulder external rotation 86    (Blank rows = not tested)   LYMPHEDEMA ASSESSMENTS:  SURGERY TYPE/DATE: Left Lumpectomy with SLNB11/11/2019, Left modified mastectomy with left axillary node dissection NUMBER OF LYMPH NODES REMOVED: 0/4 with lumpectomy, 0/1 during Mastectomy CHEMOTHERAPY: yes RADIATION:Yes HORMONE TREATMENT: may require INFECTIONS: none  LYMPHEDEMA ASSESSMENTS:  LANDMARK RIGHT  eval  10 cm proximal to olecranon process 31.4  Olecranon process 27.2  10 cm proximal to ulnar styloid process 25.7  Just proximal to ulnar styloid process 18.4  Across hand at thumb web space 20  At base of 2nd digit 6.4  (Blank rows = not tested)  LANDMARK LEFT  eval  10 cm proximal to olecranon process 33  Olecranon process 28.4  10 cm proximal to ulnar styloid process 26   Just proximal to ulnar styloid process 19.4  Across hand at thumb web space 21  At base of 2nd digit 6.7  (Blank rows = not tested)   TODAY'S TREATMENT:                                                                                                                                         DATE:  10/14/22 Checked fit of sleeve and gauntlet and gave pt size gauntlet which seems to fit better as the other one was too loose.  Practiced donning the sleeve correctly Self MLD initial education with handout translated to spanish, demonstration of each step and then cueing for pt to perform each per below Then PT performed MLD supine: In supine: Short neck, 5 diaphragmatic breaths, superficial and deep abdominals, bil axillary nodes and establishment of interaxillary pathway, L inguinal nodes and establishment of axilloinguinal pathway, then L UE working proximal to distal, moving fluid from upper inner arm outwards, and doing both sides of forearm moving fluid towards pathways spending extra time in any areas of fibrosis then retracing all steps. Then posterior interaxillary work in [place].   10/12/22: see education section  PATIENT EDUCATION:  Education details: lymphedema, anatomy and physiology of the lymphatic  system, how lymphedema is treated, subclinical lymphedema vs lymphedema, need for compression bra Person educated: Patient Education method: Explanation Education comprehension: verbalized understanding  HOME EXERCISE PROGRAM: Obtain compression bra, continue to wear sleeve and glove  ASSESSMENT:  CLINICAL IMPRESSION: Started self MLD education today with pt doing very well overall.  Noted fibrosis in the chest inferior to the incision, skin and incision tightness and decreased mobility, and general UE edema.  Pt may benefit from a pump at some point.   OBJECTIVE IMPAIRMENTS: decreased knowledge of condition, decreased ROM, decreased strength, increased edema, increased fascial  restrictions, impaired UE functional use, postural dysfunction, and pain.   ACTIVITY LIMITATIONS: carrying, lifting, and reach over head  PARTICIPATION LIMITATIONS: cleaning, laundry, community activity, occupation, and yard work  PERSONAL FACTORS: Time since onset of injury/illness/exacerbation are also affecting patient's functional outcome.   REHAB POTENTIAL: Good  CLINICAL DECISION MAKING: Stable/uncomplicated  EVALUATION COMPLEXITY: Low  GOALS: Goals reviewed with patient? Yes   SHORT TERM GOALS = LONG TERM GOALS: Target date: 11/09/22  Pt will be independent in self MLD for long term management of lymphedema.  Baseline:  Goal status: INITIAL  2.  Pt will obtain appropriate compression garments for long term management of lymphedema.  Baseline:  Goal status: INITIAL  3.  Pt will demonstrate 160 degrees of L shoulder flexion to allow her to reach overhead. Baseline:  Goal status: INITIAL  4. Pt will demonstrate 170 degrees of L shoulder abduction to allow her to reach out to the side.   Goal: INITIAL  5. Pt will be independent in a home exercise program for continued stretching and strengthening.  Goal: INITIAL  6. Pt will report she is able to raise her L arm to end range wihtout increased pain in her axilla to allow improved comfort.   GOAL: INITIAL   PLAN:  PT FREQUENCY: 2x/week  PT DURATION: 4 weeks  PLANNED INTERVENTIONS: Therapeutic exercises, Therapeutic activity, Patient/Family education, Self Care, Joint mobilization, Manual lymph drainage, Compression bandaging, scar mobilization, Taping, Vasopneumatic device, and Manual therapy  PLAN FOR NEXT SESSION: begin MLD to L UE and lateral trunk and instruct pt, PROM for L UE, eventually ball and pulleys   Quade Ramirez, Adrian Prince, PT 10/14/2022, 8:54 AM

## 2022-10-18 ENCOUNTER — Ambulatory Visit: Payer: No Typology Code available for payment source | Admitting: Physical Therapy

## 2022-10-18 ENCOUNTER — Encounter: Payer: Self-pay | Admitting: Physical Therapy

## 2022-10-18 DIAGNOSIS — Z510 Encounter for antineoplastic radiation therapy: Secondary | ICD-10-CM | POA: Insufficient documentation

## 2022-10-18 DIAGNOSIS — Z483 Aftercare following surgery for neoplasm: Secondary | ICD-10-CM

## 2022-10-18 DIAGNOSIS — R293 Abnormal posture: Secondary | ICD-10-CM

## 2022-10-18 DIAGNOSIS — Z170 Estrogen receptor positive status [ER+]: Secondary | ICD-10-CM | POA: Insufficient documentation

## 2022-10-18 DIAGNOSIS — M25612 Stiffness of left shoulder, not elsewhere classified: Secondary | ICD-10-CM

## 2022-10-18 DIAGNOSIS — C50412 Malignant neoplasm of upper-outer quadrant of left female breast: Secondary | ICD-10-CM | POA: Insufficient documentation

## 2022-10-18 DIAGNOSIS — I972 Postmastectomy lymphedema syndrome: Secondary | ICD-10-CM

## 2022-10-18 NOTE — Therapy (Signed)
OUTPATIENT PHYSICAL THERAPY ONCOLOGY TREATMENT  Patient Name: [name] MRN: [id number] DOB:07-13-1976, 47 y.o., female Today's Date: 10/18/2022  END OF SESSION:  PT End of Session - 10/18/22 1555     Visit Number 3    Number of Visits 9    Date for PT Re-Evaluation 11/09/22    PT Start Time 1503    PT Stop Time 9449    PT Time Calculation (min) 52 min    Activity Tolerance Patient tolerated treatment well    Behavior During Therapy [facility] for tasks assessed/performed               Past Medical History:  Diagnosis Date   Allergy    Asthma    Breast cancer in female [facility])    Left   Breast disorder    breast cancer May 2021   Chronic back pain    Headache    High triglycerides    History of breast cancer    History of COVID-19 04/20/2021   Hx of migraines    Morbid obesity ([place])    Personal history of chemotherapy    Personal history of radiation therapy    Past Surgical History:  Procedure Laterality Date   BREAST LUMPECTOMY     BREAST LUMPECTOMY WITH RADIOACTIVE SEED AND SENTINEL LYMPH NODE BIOPSY Left 07/15/2020   Procedure: LEFT BREAST LUMPECTOMY WITH RADIOACTIVE SEED AND LEFT AXILLARY SENTINEL LYMPH NODE BIOPSY, LEFT AXILLARY NODE RADIOACTIVE SEED GUIDED EXCISION, LEFT BREAST RADIOACTIVE SEED GUIDED EXCISION IM NODE;  Surgeon: Rolm Bookbinder, MD;  Location: [place];  Service: General;  Laterality: Left;  PEC BLOCK   CESAREAN SECTION WITH BILATERAL TUBAL LIGATION Bilateral 07/23/2015   Procedure: CESAREAN SECTION WITH BILATERAL TUBAL LIGATION;  Surgeon: Donnamae Jude, MD;  Location: [facility];  Service: Obstetrics;  Laterality: Bilateral;   IR IMAGING GUIDED PORT INSERTION  02/11/2022   MODIFIED MASTECTOMY Left 08/22/2022   Procedure: LEFT MODIFIED RADICAL MASTECTOMY, LEFT AXILLARY NODE DISSECTION, AND SKIN BIOPSY OF BACK;  Surgeon: Rolm Bookbinder, MD;  [facility];  Service: General;  Laterality: Left;   PORT-A-CATH REMOVAL N/A 04/30/2021    Procedure: REMOVAL PORT-A-CATH;  Surgeon: Rolm Bookbinder, MD;  Location: [place];  Service: General;  Laterality: N/A;   PORTACATH PLACEMENT Right 02/11/2020   Procedure: INSERTION PORT-A-CATH WITH ULTRASOUND GUIDANCE;  Surgeon: Rolm Bookbinder, MD;  Location: [place];  Service: General;  Laterality: Right;   TUBAL LIGATION     Patient Active Problem List   Diagnosis Date Noted   Health care maintenance 09/30/2022   S/P left mastectomy 08/22/2022   Burning with urination 06/01/2022   Swelling of left upper extremity 05/18/2022   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[place]) 04/01/2022   Encounter for dental examination 03/11/2022   Acute apical periodontitis 03/11/2022   Phobia of dental procedure 03/11/2022   Defective dental restoration 03/11/2022   Accretions on teeth 03/11/2022   Chronic periodontitis 03/11/2022   Teeth missing 03/11/2022   Caries 03/11/2022   Generalized gingival recession 03/11/2022   Pain, dental 03/08/2022   Cancer-related pain 03/08/2022   Port-A-Cath in place 02/15/2022   Oral allergy syndrome, subsequent encounter 02/03/2022   Adverse effect of other drugs, medicaments and biological substances, subsequent encounter 02/02/2022   Other adverse food reactions, not elsewhere classified, subsequent encounter 02/02/2022   Chest tightness 02/02/2022   Other allergic rhinitis 02/02/2022   Allergic conjunctivitis of both eyes 02/02/2022   Language barrier 12/11/2020  Morbid obesity ([place])    History of breast cancer    Genetic testing 02/13/2020   Malignant neoplasm of upper-outer quadrant of left breast in female, estrogen receptor positive ([place]) 01/27/2020   Impaired ability to use community resources due to language barrier 07/13/2013    PCP: Lazaro Arms, NP  REFERRING PROVIDER: Benay Pike, MD  REFERRING DIAG: Left UE lymphedema  THERAPY DIAG:  Stiffness of left shoulder, not elsewhere  classified  Postmastectomy lymphedema  Aftercare following surgery for neoplasm  Abnormal posture  Malignant neoplasm of upper-outer quadrant of left breast in female, estrogen receptor positive ([place])  ONSET DATE: 08/01/22  Rationale for Evaluation and Treatment: Rehabilitation  SUBJECTIVE:                                                                                                                                                                                           SUBJECTIVE STATEMENT: I have not been able to see a change in the swelling yet.   PERTINENT HISTORY:  Patient was diagnosed on 01/23/2020 with left grade III invasive ductal carcinoma breast cancer. It is ER positive, PR negative, and HER2 negative , but functionally Triple Negative,with a Ki67 of 40%. Patient with interpreter present. She underwent neoadjuvant chemotherapy from 02/12/2020 - 05/28/2020. 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 She underwent a left lumpectomy and sentinel node biopsy ( 4 negative nodes) on 07/15/2020. She underwent radiation and anti-estrogen therapy.  She had interval development of diffuse skin thickening in 2023. She had a punch biopsy of her skin at that time and this was consistent with inflammatory breast cancer. She has been treated with chemo since then. .  She had a left modified radical mastectomy on 08/22/2022 with complete therapeutic response and 0/1 LN. She noted some swelling in her left arm and on 08/25/2022 she had an [place] that was negative for DVT. Her SOZO screen on 09/19/2021 was 6.9 points above baseline putting her in the yellow zone and she was given a compression sleeve.  PAIN:  Are you having pain? Yes 5/10, the finger pain feels like a pinching pain and the upper arm pain is hard to describe, the 2nd finger and upper arm hurt; aggravating factors: touching the finger, benefits: moving the arm  PRECAUTIONS: Other: lymphedema [name]  WEIGHT BEARING  RESTRICTIONS: No  FALLS:  Has patient fallen in last 6 months? 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  LEISURE: pt does not exercise  HAND DOMINANCE: right   PRIOR LEVEL OF FUNCTION: Independent  PATIENT GOALS: get the swelling down, improve L shoulder ROM, decrease pain   OBJECTIVE:  COGNITION: Overall cognitive status: Within functional limits for tasks assessed   PALPATION: Decreased scar mobility  OBSERVATIONS / OTHER ASSESSMENTS: scar well healed, increased edema at lateral trunk and throughout [name]  POSTURE: forward head, rounded shoulders  UPPER EXTREMITY AROM/PROM:  A/PROM RIGHT   eval   Shoulder extension 68  Shoulder flexion 160  Shoulder abduction 175  Shoulder internal rotation 56  Shoulder external rotation 88    (Blank rows = not tested)  A/PROM LEFT   eval  Shoulder extension 52  Shoulder flexion 146  Shoulder abduction 148  Shoulder internal rotation 67  Shoulder external rotation 86    (Blank rows = not tested)   LYMPHEDEMA ASSESSMENTS:  SURGERY TYPE/DATE: Left Lumpectomy with SLNB11/11/2019, Left modified mastectomy with left axillary node dissection NUMBER OF LYMPH NODES REMOVED: 0/4 with lumpectomy, 0/1 during Mastectomy CHEMOTHERAPY: yes RADIATION:Yes HORMONE TREATMENT: may require INFECTIONS: none  LYMPHEDEMA ASSESSMENTS:  LANDMARK RIGHT  eval  10 cm proximal to olecranon process 31.4  Olecranon process 27.2  10 cm proximal to ulnar styloid process 25.7  Just proximal to ulnar styloid process 18.4  Across hand at thumb web space 20  At base of 2nd digit 6.4  (Blank rows = not tested)  LANDMARK LEFT   eval  10 cm proximal to olecranon process 33  Olecranon process 28.4  10 cm proximal to ulnar styloid process 26  Just proximal to ulnar styloid process 19.4  Across hand at thumb web space 21  At base of 2nd digit 6.7  (Blank rows = not tested)   TODAY'S TREATMENT:                                                                                                                                         DATE:  10/18/22 MLD in supine: In supine: Short neck, 5 diaphragmatic breaths, superficial and deep abdominals, bil axillary nodes and establishment of interaxillary pathway, L inguinal nodes and establishment of axilloinguinal pathway, then L UE working proximal to distal, moving fluid from upper inner arm outwards, and doing both sides of forearm moving fluid towards pathways spending extra time in any areas of fibrosis then retracing all steps while educating pt in proper skin stretch and sequence and having her return demonstrate proper technique with v/c to not use too much pressure and to do it slowly Instructed pt in bandaging for her fingers using mollelast to digits 2-4 since her fingers have been swelling with her gauntlet. Had pt practice applying mollelast with v/c cues form therapist. Educated her she can wear  this with her gauntlet and sleeve and at night she can wear the mollelast only.  10/14/22 Checked fit of sleeve and gauntlet and gave pt size gauntlet which seems to fit better as the other one was too Thomas.  Practiced donning the sleeve correctly Self MLD initial education with handout translated to spanish, demonstration of each step and then cueing for pt to perform each per below Then PT performed MLD supine: In supine: Short neck, 5 diaphragmatic breaths, superficial and deep abdominals, bil axillary nodes and establishment of interaxillary pathway, L inguinal nodes and establishment of axilloinguinal pathway, then L UE working proximal to distal, moving fluid from upper inner  arm outwards, and doing both sides of forearm moving fluid towards pathways spending extra time in any areas of fibrosis then retracing all steps. Then posterior interaxillary work in [place].   10/12/22: see education section  PATIENT EDUCATION:  Education details: lymphedema, anatomy and physiology of the lymphatic system, how lymphedema is treated, subclinical lymphedema vs lymphedema, need for compression bra Person educated: Patient Education method: Explanation Education comprehension: verbalized understanding  HOME EXERCISE PROGRAM: Obtain compression bra, continue to wear sleeve and glove  ASSESSMENT:  CLINICAL IMPRESSION: Continued to instruct pt in proper self MLD technique and have her return demonstrate. Began bandaging her fingers today since they are continuing to remain swollen and she has a gauntlet and not a glove. Pt was able to return demonstrate proper way to apply finger bandaging. Will assess next session if this was helpful at decreasing her finger edema.   OBJECTIVE IMPAIRMENTS: decreased knowledge of condition, decreased ROM, decreased strength, increased edema, increased fascial restrictions, impaired UE functional use, postural dysfunction, and pain.   ACTIVITY LIMITATIONS: carrying, lifting, and reach over head  PARTICIPATION LIMITATIONS: cleaning, laundry, community activity, occupation, and yard work  PERSONAL FACTORS: Time since onset of injury/illness/exacerbation are also affecting patient's functional outcome.   REHAB POTENTIAL: Good  CLINICAL DECISION MAKING: Stable/uncomplicated  EVALUATION COMPLEXITY: Low  GOALS: Goals reviewed with patient? Yes   SHORT TERM GOALS = LONG TERM GOALS: Target date: 11/09/22  Pt will be independent in self MLD for long term management of lymphedema.  Baseline:  Goal status: INITIAL  2.  Pt will obtain appropriate compression garments for long term management of lymphedema.  Baseline:  Goal status:  INITIAL  3.  Pt will demonstrate 160 degrees of L shoulder flexion to allow her to reach overhead. Baseline:  Goal status: INITIAL  4. Pt will demonstrate 170 degrees of L shoulder abduction to allow her to reach out to the side.   Goal: INITIAL  5. Pt will be independent in a home exercise program for continued stretching and strengthening.  Goal: INITIAL  6. Pt will report she is able to raise her L arm to end range wihtout increased pain in her axilla to allow improved comfort.   GOAL: INITIAL   PLAN:  PT FREQUENCY: 2x/week  PT DURATION: 4 weeks  PLANNED INTERVENTIONS: Therapeutic exercises, Therapeutic activity, Patient/Family education, Self Care, Joint mobilization, Manual lymph drainage, Compression bandaging, scar mobilization, Taping, Vasopneumatic device, and Manual therapy  PLAN FOR NEXT SESSION: instruct in UE bandaging? How was finger bandaging? begin MLD to L UE and lateral trunk and instruct pt, PROM for L UE, eventually ball and pulleys   Manus Gunning, PT 10/18/2022, 3:56 PM

## 2022-10-19 ENCOUNTER — Encounter: Payer: Self-pay | Admitting: Physical Therapy

## 2022-10-19 ENCOUNTER — Ambulatory Visit: Payer: No Typology Code available for payment source | Admitting: Physical Therapy

## 2022-10-19 ENCOUNTER — Other Ambulatory Visit: Payer: Self-pay

## 2022-10-19 ENCOUNTER — Ambulatory Visit
Admission: RE | Admit: 2022-10-19 | Discharge: 2022-10-19 | Disposition: A | Discharge status: Home / Self Care | Payer: No Typology Code available for payment source | Source: Ambulatory Visit | Attending: Radiation Oncology | Admitting: Radiation Oncology

## 2022-10-19 DIAGNOSIS — C50412 Malignant neoplasm of upper-outer quadrant of left female breast: Secondary | ICD-10-CM

## 2022-10-19 DIAGNOSIS — R293 Abnormal posture: Secondary | ICD-10-CM

## 2022-10-19 DIAGNOSIS — I972 Postmastectomy lymphedema syndrome: Secondary | ICD-10-CM

## 2022-10-19 DIAGNOSIS — M25612 Stiffness of left shoulder, not elsewhere classified: Secondary | ICD-10-CM

## 2022-10-19 DIAGNOSIS — Z483 Aftercare following surgery for neoplasm: Secondary | ICD-10-CM

## 2022-10-19 LAB — RAD ONC ARIA SESSION SUMMARY
Course Elapsed Days: 0
Plan Fractions Treated to Date: 1
Plan Prescribed Dose Per Fraction: 1.8 Gy
Plan Total Fractions Prescribed: 14
Plan Total Prescribed Dose: 25.2 Gy
Reference Point Dosage Given to Date: 1.8 Gy
Reference Point Session Dosage Given: 1.8 Gy
Session Number: 1

## 2022-10-19 NOTE — Therapy (Signed)
OUTPATIENT PHYSICAL THERAPY ONCOLOGY TREATMENT  Patient Name: [name] MRN: [id number] DOB:1975/09/29, 47 y.o., female Today's Date: 10/19/2022  END OF SESSION:  PT End of Session - 10/19/22 1159     Visit Number 4    Number of Visits 9    Date for PT Re-Evaluation 11/09/22    PT Start Time 1103    PT Stop Time 1158    PT Time Calculation (min) 55 min    Activity Tolerance Patient tolerated treatment well    Behavior During Therapy [facility] for tasks assessed/performed                Past Medical History:  Diagnosis Date   Allergy    Asthma    Breast cancer in female [facility])    Left   Breast disorder    breast cancer May 2021   Chronic back pain    Headache    High triglycerides    History of breast cancer    History of COVID-19 04/20/2021   Hx of migraines    Morbid obesity ([place])    Personal history of chemotherapy    Personal history of radiation therapy    Past Surgical History:  Procedure Laterality Date   BREAST LUMPECTOMY     BREAST LUMPECTOMY WITH RADIOACTIVE SEED AND SENTINEL LYMPH NODE BIOPSY Left 07/15/2020   Procedure: LEFT BREAST LUMPECTOMY WITH RADIOACTIVE SEED AND LEFT AXILLARY SENTINEL LYMPH NODE BIOPSY, LEFT AXILLARY NODE RADIOACTIVE SEED GUIDED EXCISION, LEFT BREAST RADIOACTIVE SEED GUIDED EXCISION IM NODE;  Surgeon: Rolm Bookbinder, MD;  Location: [place];  Service: General;  Laterality: Left;  PEC BLOCK   CESAREAN SECTION WITH BILATERAL TUBAL LIGATION Bilateral 07/23/2015   Procedure: CESAREAN SECTION WITH BILATERAL TUBAL LIGATION;  Surgeon: Donnamae Jude, MD;  Location: [facility];  Service: Obstetrics;  Laterality: Bilateral;   IR IMAGING GUIDED PORT INSERTION  02/11/2022   MODIFIED MASTECTOMY Left 08/22/2022   Procedure: LEFT MODIFIED RADICAL MASTECTOMY, LEFT AXILLARY NODE DISSECTION, AND SKIN BIOPSY OF BACK;  Surgeon: Rolm Bookbinder, MD;  Location: [facility];  Service: General;  Laterality: Left;   PORT-A-CATH REMOVAL N/A 04/30/2021    Procedure: REMOVAL PORT-A-CATH;  Surgeon: Rolm Bookbinder, MD;  Location: [place];  Service: General;  Laterality: N/A;   PORTACATH PLACEMENT Right 02/11/2020   Procedure: INSERTION PORT-A-CATH WITH ULTRASOUND GUIDANCE;  Surgeon: Rolm Bookbinder, MD;  Location: [place];  Service: General;  Laterality: Right;   TUBAL LIGATION     Patient Active Problem List   Diagnosis Date Noted   Health care maintenance 09/30/2022   S/P left mastectomy 08/22/2022   Burning with urination 06/01/2022   Swelling of left upper extremity 05/18/2022   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[place]) 04/01/2022   Encounter for dental examination 03/11/2022   Acute apical periodontitis 03/11/2022   Phobia of dental procedure 03/11/2022   Defective dental restoration 03/11/2022   Accretions on teeth 03/11/2022   Chronic periodontitis 03/11/2022   Teeth missing 03/11/2022   Caries 03/11/2022   Generalized gingival recession 03/11/2022   Pain, dental 03/08/2022   Cancer-related pain 03/08/2022   Port-A-Cath in place 02/15/2022   Oral allergy syndrome, subsequent encounter 02/03/2022   Adverse effect of other drugs, medicaments and biological substances, subsequent encounter 02/02/2022   Other adverse food reactions, not elsewhere classified, subsequent encounter 02/02/2022   Chest tightness 02/02/2022   Other allergic rhinitis 02/02/2022   Allergic conjunctivitis of both eyes 02/02/2022   Language barrier 12/11/2020  Morbid obesity ([place])    History of breast cancer    Genetic testing 02/13/2020   Malignant neoplasm of upper-outer quadrant of left breast in female, estrogen receptor positive ([place]) 01/27/2020   Impaired ability to use community resources due to language barrier 07/13/2013    PCP: Lazaro Arms, NP  REFERRING PROVIDER: Benay Pike, MD  REFERRING DIAG: Left UE lymphedema  THERAPY DIAG:  Postmastectomy lymphedema  Stiffness of left shoulder, not  elsewhere classified  Aftercare following surgery for neoplasm  Abnormal posture  Malignant neoplasm of upper-outer quadrant of left breast in female, estrogen receptor positive ([place])  ONSET DATE: 08/01/22  Rationale for Evaluation and Treatment: Rehabilitation  SUBJECTIVE:                                                                                                                                                                                           SUBJECTIVE STATEMENT: The pain in my one finger has improved some but the pain in the pointer finger is the same. It hurts when I make a fist.   PERTINENT HISTORY:  Patient was diagnosed on 01/23/2020 with left grade III invasive ductal carcinoma breast cancer. It is ER positive, PR negative, and HER2 negative , but functionally Triple Negative,with a Ki67 of 40%. Patient with interpreter present. She underwent neoadjuvant chemotherapy from 02/12/2020 - 05/28/2020. 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 She underwent a left lumpectomy and sentinel node biopsy ( 4 negative nodes) on 07/15/2020. She underwent radiation and anti-estrogen therapy.  She had interval development of diffuse skin thickening in 2023. She had a punch biopsy of her skin at that time and this was consistent with inflammatory breast cancer. She has been treated with chemo since then. .  She had a left modified radical mastectomy on 08/22/2022 with complete therapeutic response and 0/1 LN. She noted some swelling in her left arm and on 08/25/2022 she had an [place] that was negative for DVT. Her SOZO screen on 09/19/2021 was 6.9 points above baseline putting her in the yellow zone and she was given a compression sleeve.  PAIN:  Are you having pain? Yes 5/10, the finger pain feels like a pinching pain and the upper arm pain is hard to describe, the 2nd finger and upper arm hurt; aggravating factors: touching the finger, benefits: moving the arm  PRECAUTIONS:  Other: lymphedema [name]  WEIGHT BEARING RESTRICTIONS: No  FALLS:  Has patient fallen in last 6 months? 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  LEISURE: pt does not exercise  HAND DOMINANCE: right   PRIOR LEVEL OF FUNCTION: Independent  PATIENT GOALS: get the swelling down, improve L shoulder ROM, decrease pain   OBJECTIVE:  COGNITION: Overall cognitive status: Within functional limits for tasks assessed   PALPATION: Decreased scar mobility  OBSERVATIONS / OTHER ASSESSMENTS: scar well healed, increased edema at lateral trunk and throughout [name]  POSTURE: forward head, rounded shoulders  UPPER EXTREMITY AROM/PROM:  A/PROM RIGHT   eval   Shoulder extension 68  Shoulder flexion 160  Shoulder abduction 175  Shoulder internal rotation 56  Shoulder external rotation 88    (Blank rows = not tested)  A/PROM LEFT   eval  Shoulder extension 52  Shoulder flexion 146  Shoulder abduction 148  Shoulder internal rotation 67  Shoulder external rotation 86    (Blank rows = not tested)   LYMPHEDEMA ASSESSMENTS:  SURGERY TYPE/DATE: Left Lumpectomy with SLNB11/11/2019, Left modified mastectomy with left axillary node dissection NUMBER OF LYMPH NODES REMOVED: 0/4 with lumpectomy, 0/1 during Mastectomy CHEMOTHERAPY: yes RADIATION:Yes HORMONE TREATMENT: may require INFECTIONS: none  LYMPHEDEMA ASSESSMENTS:  LANDMARK RIGHT  eval  10 cm proximal to olecranon process 31.4  Olecranon process 27.2  10 cm proximal to ulnar styloid process 25.7  Just proximal to ulnar styloid process 18.4  Across hand at thumb web space 20  At base of 2nd digit 6.4   (Blank rows = not tested)  LANDMARK LEFT  eval  10 cm proximal to olecranon process 33  Olecranon process 28.4  10 cm proximal to ulnar styloid process 26  Just proximal to ulnar styloid process 19.4  Across hand at thumb web space 21  At base of 2nd digit 6.7  (Blank rows = not tested)   TODAY'S TREATMENT:                                                                                                                                         DATE:  10/19/22 MLD in supine: In supine: Short neck, 5 diaphragmatic breaths, superficial and deep abdominals, bil axillary nodes and establishment of interaxillary pathway, L inguinal nodes and establishment of axilloinguinal pathway, then L UE working proximal to distal, moving fluid from upper inner arm outwards, and doing both sides of forearm moving fluid towards pathways spending extra time in any areas of fibrosis then retracing all steps  Compression bandaging: TG soft from hand to axilla,  mollelast to digits 1-4 , artiflex from hand to axilla, 1 6 cm bandage at hand, 1 8 cm bandage from wrist to axilla and 1 12 cm bandage from wrist to axilla. Educated pt throughout on how to apply compression bandages  and educated her to remove them if she has increased pain, her fingers turn blue or if she has any numbness that does not go away when she moves.   10/18/22 MLD in supine: In supine: Short neck, 5 diaphragmatic breaths, superficial and deep abdominals, bil axillary nodes and establishment of interaxillary pathway, L inguinal nodes and establishment of axilloinguinal pathway, then L UE working proximal to distal, moving fluid from upper inner arm outwards, and doing both sides of forearm moving fluid towards pathways spending extra time in any areas of fibrosis then retracing all steps while educating pt in proper skin stretch and sequence and having her return demonstrate proper technique with v/c to not use too much pressure and to do it  slowly Instructed pt in bandaging for her fingers using mollelast to digits 2-4 since her fingers have been swelling with her gauntlet. Had pt practice applying mollelast with v/c cues form therapist. Educated her she can wear this with her gauntlet and sleeve and at night she can wear the mollelast only.  10/14/22 Checked fit of sleeve and gauntlet and gave pt size gauntlet which seems to fit better as the other one was too loose.  Practiced donning the sleeve correctly Self MLD initial education with handout translated to spanish, demonstration of each step and then cueing for pt to perform each per below Then PT performed MLD supine: In supine: Short neck, 5 diaphragmatic breaths, superficial and deep abdominals, bil axillary nodes and establishment of interaxillary pathway, L inguinal nodes and establishment of axilloinguinal pathway, then L UE working proximal to distal, moving fluid from upper inner arm outwards, and doing both sides of forearm moving fluid towards pathways spending extra time in any areas of fibrosis then retracing all steps. Then posterior interaxillary work in [place].   10/12/22: see education section  PATIENT EDUCATION:  Education details: lymphedema, anatomy and physiology of the lymphatic system, how lymphedema is treated, subclinical lymphedema vs lymphedema, need for compression bra Person educated: Patient Education method: Explanation Education comprehension: verbalized understanding  HOME EXERCISE PROGRAM: Obtain compression bra, continue to wear sleeve and glove  ASSESSMENT:  CLINICAL IMPRESSION: Continued with MLD to [name] today. Her third digit felt better after wearing mollelast with her gauntlet but she was still having pain in the second digit. Today applied compression bandaging to her L hand and arm to help decrease edema and hopefully ease the discomfort in her finger. Educated pt throughout on how to reapply if she feels comfortable doing so on  Friday.   OBJECTIVE IMPAIRMENTS: decreased knowledge of condition, decreased ROM, decreased strength, increased edema, increased fascial restrictions, impaired UE functional use, postural dysfunction, and pain.   ACTIVITY LIMITATIONS: carrying, lifting, and reach over head  PARTICIPATION LIMITATIONS: cleaning, laundry, community activity, occupation, and yard work  PERSONAL FACTORS: Time since onset of injury/illness/exacerbation are also affecting patient's functional outcome.   REHAB POTENTIAL: Good  CLINICAL DECISION MAKING: Stable/uncomplicated  EVALUATION COMPLEXITY: Low  GOALS: Goals reviewed with patient? Yes   SHORT TERM GOALS = LONG TERM GOALS: Target date: 11/09/22  Pt will be independent in self MLD for long term management of lymphedema.  Baseline:  Goal status: INITIAL  2.  Pt will obtain appropriate compression garments for long term management of lymphedema.  Baseline:  Goal status: INITIAL  3.  Pt will demonstrate 160 degrees of L shoulder flexion to allow her to reach overhead. Baseline:  Goal status: INITIAL  4. Pt will demonstrate 170 degrees of L shoulder  abduction to allow her to reach out to the side.   Goal: INITIAL  5. Pt will be independent in a home exercise program for continued stretching and strengthening.  Goal: INITIAL  6. Pt will report she is able to raise her L arm to end range wihtout increased pain in her axilla to allow improved comfort.   GOAL: INITIAL   PLAN:  PT FREQUENCY: 2x/week  PT DURATION: 4 weeks  PLANNED INTERVENTIONS: Therapeutic exercises, Therapeutic activity, Patient/Family education, Self Care, Joint mobilization, Manual lymph drainage, Compression bandaging, scar mobilization, Taping, Vasopneumatic device, and Manual therapy  PLAN FOR NEXT SESSION: instruct in UE bandaging? How was finger bandaging? begin MLD to L UE and lateral trunk and instruct pt, PROM for L UE, eventually ball and pulleys   [employer], PT 10/19/2022, 12:04 PM

## 2022-10-20 ENCOUNTER — Other Ambulatory Visit: Payer: Self-pay

## 2022-10-20 ENCOUNTER — Ambulatory Visit
Admission: RE | Admit: 2022-10-20 | Discharge: 2022-10-20 | Disposition: A | Discharge status: Home / Self Care | Payer: No Typology Code available for payment source | Source: Ambulatory Visit | Attending: Radiation Oncology | Admitting: Radiation Oncology

## 2022-10-20 ENCOUNTER — Ambulatory Visit: Payer: No Typology Code available for payment source

## 2022-10-20 LAB — RAD ONC ARIA SESSION SUMMARY
Course Elapsed Days: 1
Plan Fractions Treated to Date: 1
Plan Prescribed Dose Per Fraction: 1.8 Gy
Plan Total Fractions Prescribed: 14
Plan Total Prescribed Dose: 25.2 Gy
Reference Point Dosage Given to Date: 1.8 Gy
Reference Point Session Dosage Given: 1.8 Gy
Session Number: 2

## 2022-10-21 ENCOUNTER — Other Ambulatory Visit: Payer: Self-pay

## 2022-10-21 ENCOUNTER — Ambulatory Visit
Admission: RE | Admit: 2022-10-21 | Discharge: 2022-10-21 | Disposition: A | Discharge status: Home / Self Care | Payer: No Typology Code available for payment source | Source: Ambulatory Visit | Attending: Radiation Oncology

## 2022-10-21 LAB — RAD ONC ARIA SESSION SUMMARY
Course Elapsed Days: 2
Plan Fractions Treated to Date: 2
Plan Prescribed Dose Per Fraction: 1.8 Gy
Plan Total Fractions Prescribed: 14
Plan Total Prescribed Dose: 25.2 Gy
Reference Point Dosage Given to Date: 3.6 Gy
Reference Point Session Dosage Given: 1.8 Gy
Session Number: 3

## 2022-10-24 ENCOUNTER — Ambulatory Visit
Admission: RE | Admit: 2022-10-24 | Discharge: 2022-10-24 | Disposition: A | Discharge status: Home / Self Care | Payer: No Typology Code available for payment source | Source: Ambulatory Visit | Attending: Radiation Oncology | Admitting: Radiation Oncology

## 2022-10-24 ENCOUNTER — Other Ambulatory Visit: Payer: Self-pay

## 2022-10-24 DIAGNOSIS — Z170 Estrogen receptor positive status [ER+]: Secondary | ICD-10-CM

## 2022-10-24 LAB — RAD ONC ARIA SESSION SUMMARY
Course Elapsed Days: 5
Plan Fractions Treated to Date: 2
Plan Prescribed Dose Per Fraction: 1.8 Gy
Plan Total Fractions Prescribed: 14
Plan Total Prescribed Dose: 25.2 Gy
Reference Point Dosage Given to Date: 3.6 Gy
Reference Point Session Dosage Given: 1.8 Gy
Session Number: 4

## 2022-10-24 MED ORDER — RADIAPLEXRX EX GEL: Freq: Once | CUTANEOUS | Status: AC

## 2022-10-24 MED ORDER — ALRA NON-METALLIC DEODORANT (RAD-ONC)
1.0000 | Freq: Once | TOPICAL | Status: AC
  Administered 2022-10-24: 1 via TOPICAL

## 2022-10-25 ENCOUNTER — Ambulatory Visit
Admission: RE | Admit: 2022-10-25 | Discharge: 2022-10-25 | Disposition: A | Discharge status: Home / Self Care | Payer: No Typology Code available for payment source | Source: Ambulatory Visit | Attending: Radiation Oncology | Admitting: Radiation Oncology

## 2022-10-25 ENCOUNTER — Ambulatory Visit: Payer: No Typology Code available for payment source | Admitting: Physical Therapy

## 2022-10-25 ENCOUNTER — Other Ambulatory Visit: Payer: Self-pay

## 2022-10-25 ENCOUNTER — Encounter: Payer: Self-pay | Admitting: Physical Therapy

## 2022-10-25 DIAGNOSIS — Z483 Aftercare following surgery for neoplasm: Secondary | ICD-10-CM

## 2022-10-25 DIAGNOSIS — C50412 Malignant neoplasm of upper-outer quadrant of left female breast: Secondary | ICD-10-CM

## 2022-10-25 DIAGNOSIS — R293 Abnormal posture: Secondary | ICD-10-CM

## 2022-10-25 DIAGNOSIS — I972 Postmastectomy lymphedema syndrome: Secondary | ICD-10-CM

## 2022-10-25 DIAGNOSIS — Z170 Estrogen receptor positive status [ER+]: Secondary | ICD-10-CM

## 2022-10-25 DIAGNOSIS — M25612 Stiffness of left shoulder, not elsewhere classified: Secondary | ICD-10-CM

## 2022-10-25 LAB — RAD ONC ARIA SESSION SUMMARY
Course Elapsed Days: 6
Plan Fractions Treated to Date: 3
Plan Prescribed Dose Per Fraction: 1.8 Gy
Plan Total Fractions Prescribed: 14
Plan Total Prescribed Dose: 25.2 Gy
Reference Point Dosage Given to Date: 5.4 Gy
Reference Point Session Dosage Given: 1.8 Gy
Session Number: 5

## 2022-10-25 NOTE — Therapy (Signed)
OUTPATIENT PHYSICAL THERAPY ONCOLOGY TREATMENT  Patient Name: [name] MRN: [id number] DOB:[birth year], 47 y.o., female Today's Date: 10/25/2022  END OF SESSION:  PT End of Session - 10/25/22 1106     Visit Number 5    Number of Visits 9    Date for PT Re-Evaluation 11/09/22    PT Start Time 1105    PT Stop Time 1200    PT Time Calculation (min) 55 min    Activity Tolerance Patient tolerated treatment well    Behavior During Therapy [facility] for tasks assessed/performed                Past Medical History:  Diagnosis Date   Allergy    Asthma    Breast cancer in female [facility])    Left   Breast disorder    breast cancer May 2021   Chronic back pain    Headache    High triglycerides    History of breast cancer    History of COVID-19 04/20/2021   Hx of migraines    Morbid obesity ([place])    Personal history of chemotherapy    Personal history of radiation therapy    Past Surgical History:  Procedure Laterality Date   BREAST LUMPECTOMY     BREAST LUMPECTOMY WITH RADIOACTIVE SEED AND SENTINEL LYMPH NODE BIOPSY Left 07/15/2020   Procedure: LEFT BREAST LUMPECTOMY WITH RADIOACTIVE SEED AND LEFT AXILLARY SENTINEL LYMPH NODE BIOPSY, LEFT AXILLARY NODE RADIOACTIVE SEED GUIDED EXCISION, LEFT BREAST RADIOACTIVE SEED GUIDED EXCISION IM NODE;  Surgeon: Rolm Bookbinder, MD;  Location: [place];  Service: General;  Laterality: Left;  PEC BLOCK   CESAREAN SECTION WITH BILATERAL TUBAL LIGATION Bilateral 07/23/2015   Procedure: CESAREAN SECTION WITH BILATERAL TUBAL LIGATION;  Surgeon: Donnamae Jude, MD;  Location: [facility];  Service: Obstetrics;  Laterality: Bilateral;   IR IMAGING GUIDED PORT INSERTION  02/11/2022   MODIFIED MASTECTOMY Left 08/22/2022   Procedure: LEFT MODIFIED RADICAL MASTECTOMY, LEFT AXILLARY NODE DISSECTION, AND SKIN BIOPSY OF BACK;  Surgeon: Rolm Bookbinder, MD;  Location: [facility];  Service: General;  Laterality: Left;   PORT-A-CATH REMOVAL N/A 04/30/2021    Procedure: REMOVAL PORT-A-CATH;  Surgeon: Rolm Bookbinder, MD;  Location: [place];  Service: General;  Laterality: N/A;   PORTACATH PLACEMENT Right 02/11/2020   Procedure: INSERTION PORT-A-CATH WITH ULTRASOUND GUIDANCE;  Surgeon: Rolm Bookbinder, MD;  Location: [place];  Service: General;  Laterality: Right;   TUBAL LIGATION     Patient Active Problem List   Diagnosis Date Noted   Health care maintenance 09/30/2022   S/P left mastectomy 08/22/2022   Burning with urination 06/01/2022   Swelling of left upper extremity 05/18/2022   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[place]) 04/01/2022   Encounter for dental examination 03/11/2022   Acute apical periodontitis 03/11/2022   Phobia of dental procedure 03/11/2022   Defective dental restoration 03/11/2022   Accretions on teeth 03/11/2022   Chronic periodontitis 03/11/2022   Teeth missing 03/11/2022   Caries 03/11/2022   Generalized gingival recession 03/11/2022   Pain, dental 03/08/2022   Cancer-related pain 03/08/2022   Port-A-Cath in place 02/15/2022   Oral allergy syndrome, subsequent encounter 02/03/2022   Adverse effect of other drugs, medicaments and biological substances, subsequent encounter 02/02/2022   Other adverse food reactions, not elsewhere classified, subsequent encounter 02/02/2022   Chest tightness 02/02/2022   Other allergic rhinitis 02/02/2022   Allergic conjunctivitis of both eyes 02/02/2022   Language barrier 12/11/2020  Morbid obesity ([place])    History of breast cancer    Genetic testing 02/13/2020   Malignant neoplasm of upper-outer quadrant of left breast in female, estrogen receptor positive ([place]) 01/27/2020   Impaired ability to use community resources due to language barrier 07/13/2013    PCP: Lazaro Arms, NP  REFERRING PROVIDER: Benay Pike, MD  REFERRING DIAG: Left UE lymphedema  THERAPY DIAG:  Postmastectomy lymphedema  Stiffness of left shoulder, not  elsewhere classified  Aftercare following surgery for neoplasm  Abnormal posture  Malignant neoplasm of upper-outer quadrant of left breast in female, estrogen receptor positive ([place])  ONSET DATE: 08/01/22  Rationale for Evaluation and Treatment: Rehabilitation  SUBJECTIVE:                                                                                                                                                                                           SUBJECTIVE STATEMENT: I took the bandages off on Friday and the swelling was down. I then used the sleeve and gauntlet and wrapped the fingers. This morning it was worse.   PERTINENT HISTORY:  Patient was diagnosed on 01/23/2020 with left grade III invasive ductal carcinoma breast cancer. It is ER positive, PR negative, and HER2 negative , but functionally Triple Negative,with a Ki67 of 40%. Patient with interpreter present. She underwent neoadjuvant chemotherapy from 02/12/2020 - 05/28/2020. 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 She underwent a left lumpectomy and sentinel node biopsy ( 4 negative nodes) on 07/15/2020. She underwent radiation and anti-estrogen therapy.  She had interval development of diffuse skin thickening in 2023. She had a punch biopsy of her skin at that time and this was consistent with inflammatory breast cancer. She has been treated with chemo since then. .  She had a left modified radical mastectomy on 08/22/2022 with complete therapeutic response and 0/1 LN. She noted some swelling in her left arm and on 08/25/2022 she had an [place] that was negative for DVT. Her SOZO screen on 09/19/2021 was 6.9 points above baseline putting her in the yellow zone and she was given a compression sleeve.  PAIN:  Are you having pain? No just stiffness in 2nd digit when making a fist   PRECAUTIONS: Other: lymphedema [name]  WEIGHT BEARING RESTRICTIONS: No  FALLS:  Has patient fallen in last 6 months? 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  LEISURE: pt does not exercise  HAND DOMINANCE: right   PRIOR LEVEL OF FUNCTION: Independent  PATIENT GOALS: get the swelling down, improve L shoulder ROM, decrease pain   OBJECTIVE:  COGNITION: Overall cognitive status: Within functional limits for tasks assessed   PALPATION: Decreased scar mobility  OBSERVATIONS / OTHER ASSESSMENTS: scar well healed, increased edema at lateral trunk and throughout [name]  POSTURE: forward head, rounded shoulders  UPPER EXTREMITY AROM/PROM:  A/PROM RIGHT   eval   Shoulder extension 68  Shoulder flexion 160  Shoulder abduction 175  Shoulder internal rotation 56  Shoulder external rotation 88    (Blank rows = not tested)  A/PROM LEFT   eval  Shoulder extension 52  Shoulder flexion 146  Shoulder abduction 148  Shoulder internal rotation 67  Shoulder external rotation 86    (Blank rows = not tested)   LYMPHEDEMA ASSESSMENTS:  SURGERY TYPE/DATE: Left Lumpectomy with SLNB11/11/2019, Left modified mastectomy with left axillary node dissection NUMBER OF LYMPH NODES REMOVED: 0/4 with lumpectomy, 0/1 during Mastectomy CHEMOTHERAPY: yes RADIATION:Yes HORMONE TREATMENT: may require INFECTIONS: none  LYMPHEDEMA ASSESSMENTS:  LANDMARK RIGHT  eval  10 cm proximal to olecranon process 31.4  Olecranon process 27.2  10 cm proximal to ulnar styloid process 25.7  Just proximal to ulnar styloid process 18.4  Across hand at thumb web space 20  At base of 2nd digit 6.4  (Blank rows = not tested)  LANDMARK LEFT  eval LEFT 10/25/22  10 cm proximal to olecranon process 33 33  Olecranon  process 28.4 28.5  10 cm proximal to ulnar styloid process 26 26.5  Just proximal to ulnar styloid process 19.4 19  Across hand at thumb web space 21 20.5   TODAY'S TREATMENT:                                                                                                                                         DATE:  10/25/22-  See assessment - spent session doing education on subclinical lymphedema vs lymphedema, what the ldex screening score means, how visible swelling is usually indicative of lymphedema, how redness and pain are signs of infection and doing MLD and wearing compression can worsen it - sent inbasket message to Dr. Isidore Moos regarding redness/pain/swelling   10/19/22 MLD in supine: In supine: Short neck, 5 diaphragmatic breaths, superficial and deep abdominals, bil axillary nodes and establishment of interaxillary pathway, L inguinal nodes and establishment of axilloinguinal pathway, then L UE working proximal to distal, moving fluid from upper inner arm outwards, and doing both sides of forearm moving fluid towards pathways spending extra time in any areas of fibrosis then retracing all steps  Compression bandaging: TG soft from hand to axilla,  mollelast to digits 1-4 , artiflex from hand to axilla, 1 6 cm bandage  at hand, 1 8 cm bandage from wrist to axilla and 1 12 cm bandage from wrist to axilla. Educated pt throughout on how to apply compression bandages and educated her to remove them if she has increased pain, her fingers turn blue or if she has any numbness that does not go away when she moves.   10/18/22 MLD in supine: In supine: Short neck, 5 diaphragmatic breaths, superficial and deep abdominals, bil axillary nodes and establishment of interaxillary pathway, L inguinal nodes and establishment of axilloinguinal pathway, then L UE working proximal to distal, moving fluid from upper inner arm outwards, and doing both sides of forearm moving fluid towards pathways spending extra time  in any areas of fibrosis then retracing all steps while educating pt in proper skin stretch and sequence and having her return demonstrate proper technique with v/c to not use too much pressure and to do it slowly Instructed pt in bandaging for her fingers using mollelast to digits 2-4 since her fingers have been swelling with her gauntlet. Had pt practice applying mollelast with v/c cues form therapist. Educated her she can wear this with her gauntlet and sleeve and at night she can wear the mollelast only.  10/14/22 Checked fit of sleeve and gauntlet and gave pt size gauntlet which seems to fit better as the other one was too loose.  Practiced donning the sleeve correctly Self MLD initial education with handout translated to spanish, demonstration of each step and then cueing for pt to perform each per below Then PT performed MLD supine: In supine: Short neck, 5 diaphragmatic breaths, superficial and deep abdominals, bil axillary nodes and establishment of interaxillary pathway, L inguinal nodes and establishment of axilloinguinal pathway, then L UE working proximal to distal, moving fluid from upper inner arm outwards, and doing both sides of forearm moving fluid towards pathways spending extra time in any areas of fibrosis then retracing all steps. Then posterior interaxillary work in [place].   10/12/22: see education section  PATIENT EDUCATION:  Education details: lymphedema, anatomy and physiology of the lymphatic system, how lymphedema is treated, subclinical lymphedema vs lymphedema, need for compression bra Person educated: Patient Education method: Explanation Education comprehension: verbalized understanding  HOME EXERCISE PROGRAM: Obtain compression bra, continue to wear sleeve and glove  ASSESSMENT:  CLINICAL IMPRESSION: Korrina's SOZO screening placed her in the yellow (subclinical lymphedema) which prompted this episode of care. It is unusual because usually when someone has  visible swelling they show up in the red (true lymphedema). Treatment has included MLD and we just began bandaging because her hand is very swollen and there is visible swelling all the way up her forearm.  SOZO was repeated today and it was back down in the green despite very visible swelling in hand and forearm. She reports discomfort in her arm and lateral trunk. She has increased swelling in lateral trunk and this area is red. She thinks it may be from the radiation but she is still very early in her treatment. She has discomfort to touch in lateral trunk. Sent an inbasket message to pt's team of doctors due to fear of possible infection and unique presentation given her ldex score and swelling. Did not bandage today or perform MLD due to possible infection. Once she is cleared by her doctors will begin again. Spent session educating pt about her ldex score, signs of infection, trying to figure out why she is swelling and why the sozo would not detect that. Pt agreeable to plan  to hold off on compression until cleared of infection.   OBJECTIVE IMPAIRMENTS: decreased knowledge of condition, decreased ROM, decreased strength, increased edema, increased fascial restrictions, impaired UE functional use, postural dysfunction, and pain.   ACTIVITY LIMITATIONS: carrying, lifting, and reach over head  PARTICIPATION LIMITATIONS: cleaning, laundry, community activity, occupation, and yard work  PERSONAL FACTORS: Time since onset of injury/illness/exacerbation are also affecting patient's functional outcome.   REHAB POTENTIAL: Good  CLINICAL DECISION MAKING: Stable/uncomplicated  EVALUATION COMPLEXITY: Low  GOALS: Goals reviewed with patient? Yes   SHORT TERM GOALS = LONG TERM GOALS: Target date: 11/09/22  Pt will be independent in self MLD for long term management of lymphedema.  Baseline:  Goal status: INITIAL  2.  Pt will obtain appropriate compression garments for long term management of  lymphedema.  Baseline:  Goal status: INITIAL  3.  Pt will demonstrate 160 degrees of L shoulder flexion to allow her to reach overhead. Baseline:  Goal status: INITIAL  4. Pt will demonstrate 170 degrees of L shoulder abduction to allow her to reach out to the side.   Goal: INITIAL  5. Pt will be independent in a home exercise program for continued stretching and strengthening.  Goal: INITIAL  6. Pt will report she is able to raise her L arm to end range wihtout increased pain in her axilla to allow improved comfort.   GOAL: INITIAL   PLAN:  PT FREQUENCY: 2x/week  PT DURATION: 4 weeks  PLANNED INTERVENTIONS: Therapeutic exercises, Therapeutic activity, Patient/Family education, Self Care, Joint mobilization, Manual lymph drainage, Compression bandaging, scar mobilization, Taping, Vasopneumatic device, and Manual therapy  PLAN FOR NEXT SESSION: what did dr say? instruct in UE bandaging? How was finger bandaging? cont MLD to L UE and lateral trunk and instruct pt, PROM for L UE, eventually ball and pulleys   [employer], PT 10/25/2022, 1:06 PM

## 2022-10-26 ENCOUNTER — Telehealth: Payer: Self-pay | Admitting: Hematology and Oncology

## 2022-10-26 ENCOUNTER — Ambulatory Visit
Admission: RE | Admit: 2022-10-26 | Discharge: 2022-10-26 | Disposition: A | Discharge status: Home / Self Care | Payer: No Typology Code available for payment source | Source: Ambulatory Visit | Attending: Radiation Oncology | Admitting: Radiation Oncology

## 2022-10-26 ENCOUNTER — Other Ambulatory Visit: Payer: Self-pay

## 2022-10-26 LAB — RAD ONC ARIA SESSION SUMMARY
Course Elapsed Days: 7
Plan Fractions Treated to Date: 3
Plan Prescribed Dose Per Fraction: 1.8 Gy
Plan Total Fractions Prescribed: 14
Plan Total Prescribed Dose: 25.2 Gy
Reference Point Dosage Given to Date: 5.4 Gy
Reference Point Session Dosage Given: 1.8 Gy
Session Number: 6

## 2022-10-26 NOTE — Telephone Encounter (Signed)
Spoke with patient confirming upcoming appointment 

## 2022-10-27 ENCOUNTER — Ambulatory Visit
Admission: RE | Admit: 2022-10-27 | Discharge: 2022-10-27 | Disposition: A | Discharge status: Home / Self Care | Payer: No Typology Code available for payment source | Source: Ambulatory Visit | Attending: Radiation Oncology | Admitting: Radiation Oncology

## 2022-10-27 ENCOUNTER — Ambulatory Visit: Payer: No Typology Code available for payment source

## 2022-10-27 ENCOUNTER — Other Ambulatory Visit: Payer: Self-pay

## 2022-10-27 VITALS — Wt 190.1 lb

## 2022-10-27 DIAGNOSIS — I972 Postmastectomy lymphedema syndrome: Secondary | ICD-10-CM

## 2022-10-27 DIAGNOSIS — Z170 Estrogen receptor positive status [ER+]: Secondary | ICD-10-CM

## 2022-10-27 DIAGNOSIS — R293 Abnormal posture: Secondary | ICD-10-CM

## 2022-10-27 DIAGNOSIS — M25612 Stiffness of left shoulder, not elsewhere classified: Secondary | ICD-10-CM

## 2022-10-27 DIAGNOSIS — Z483 Aftercare following surgery for neoplasm: Secondary | ICD-10-CM

## 2022-10-27 LAB — RAD ONC ARIA SESSION SUMMARY
Course Elapsed Days: 8
Plan Fractions Treated to Date: 4
Plan Prescribed Dose Per Fraction: 1.8 Gy
Plan Total Fractions Prescribed: 14
Plan Total Prescribed Dose: 25.2 Gy
Reference Point Dosage Given to Date: 7.2 Gy
Reference Point Session Dosage Given: 1.8 Gy
Session Number: 7

## 2022-10-27 NOTE — Therapy (Signed)
OUTPATIENT PHYSICAL THERAPY ONCOLOGY TREATMENT  Patient Name: [name] MRN: [id number] DOB:09-17-1975, 47 y.o., female Today's Date: 10/27/2022  END OF SESSION:  PT End of Session - 10/27/22 1118     Visit Number 6    Number of Visits 9    Date for PT Re-Evaluation 11/09/22    Authorization Type CAFA expires 7/28    PT Start Time 1104    PT Stop Time 1203    PT Time Calculation (min) 59 min    Activity Tolerance Patient tolerated treatment well    Behavior During Therapy [facility] for tasks assessed/performed                Past Medical History:  Diagnosis Date   Allergy    Asthma    Breast cancer in female [facility])    Left   Breast disorder    breast cancer May 2021   Chronic back pain    Headache    High triglycerides    History of breast cancer    History of COVID-19 04/20/2021   Hx of migraines    Morbid obesity ([place])    Personal history of chemotherapy    Personal history of radiation therapy    Past Surgical History:  Procedure Laterality Date   BREAST LUMPECTOMY     BREAST LUMPECTOMY WITH RADIOACTIVE SEED AND SENTINEL LYMPH NODE BIOPSY Left 07/15/2020   Procedure: LEFT BREAST LUMPECTOMY WITH RADIOACTIVE SEED AND LEFT AXILLARY SENTINEL LYMPH NODE BIOPSY, LEFT AXILLARY NODE RADIOACTIVE SEED GUIDED EXCISION, LEFT BREAST RADIOACTIVE SEED GUIDED EXCISION IM NODE;  Surgeon: Rolm Bookbinder, MD;  Location: [place];  Service: General;  Laterality: Left;  PEC BLOCK   CESAREAN SECTION WITH BILATERAL TUBAL LIGATION Bilateral 07/23/2015   Procedure: CESAREAN SECTION WITH BILATERAL TUBAL LIGATION;  Surgeon: Donnamae Jude, MD;  Location: [facility];  Service: Obstetrics;  Laterality: Bilateral;   IR IMAGING GUIDED PORT INSERTION  02/11/2022   MODIFIED MASTECTOMY Left 08/22/2022   Procedure: LEFT MODIFIED RADICAL MASTECTOMY, LEFT AXILLARY NODE DISSECTION, AND SKIN BIOPSY OF BACK;  Surgeon: Rolm Bookbinder, MD;  Location: [facility];  Service: General;  Laterality: Left;    PORT-A-CATH REMOVAL N/A 04/30/2021   Procedure: REMOVAL PORT-A-CATH;  Surgeon: Rolm Bookbinder, MD;  Location: [place];  Service: General;  Laterality: N/A;   PORTACATH PLACEMENT Right 02/11/2020   Procedure: INSERTION PORT-A-CATH WITH ULTRASOUND GUIDANCE;  Surgeon: Rolm Bookbinder, MD;  Location: [place];  Service: General;  Laterality: Right;   TUBAL LIGATION     Patient Active Problem List   Diagnosis Date Noted   Health care maintenance 09/30/2022   S/P left mastectomy 08/22/2022   Burning with urination 06/01/2022   Swelling of left upper extremity 05/18/2022   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[place]) 04/01/2022   Encounter for dental examination 03/11/2022   Acute apical periodontitis 03/11/2022   Phobia of dental procedure 03/11/2022   Defective dental restoration 03/11/2022   Accretions on teeth 03/11/2022   Chronic periodontitis 03/11/2022   Teeth missing 03/11/2022   Caries 03/11/2022   Generalized gingival recession 03/11/2022   Pain, dental 03/08/2022   Cancer-related pain 03/08/2022   Port-A-Cath in place 02/15/2022   Oral allergy syndrome, subsequent encounter 02/03/2022   Adverse effect of other drugs, medicaments and biological substances, subsequent encounter 02/02/2022   Other adverse food reactions, not elsewhere classified, subsequent encounter 02/02/2022   Chest tightness 02/02/2022   Other allergic rhinitis 02/02/2022   Allergic conjunctivitis of  both eyes 02/02/2022   Language barrier 12/11/2020   Morbid obesity ([place])    History of breast cancer    Genetic testing 02/13/2020   Malignant neoplasm of upper-outer quadrant of left breast in female, estrogen receptor positive ([place]) 01/27/2020   Impaired ability to use community resources due to language barrier 07/13/2013    PCP: Lazaro Arms, NP  REFERRING PROVIDER: Benay Pike, MD  REFERRING DIAG: Left UE lymphedema  THERAPY DIAG:  Postmastectomy  lymphedema  Stiffness of left shoulder, not elsewhere classified  Aftercare following surgery for neoplasm  Abnormal posture  Malignant neoplasm of upper-outer quadrant of left breast in female, estrogen receptor positive ([place])  ONSET DATE: 08/01/22  Rationale for Evaluation and Treatment: Rehabilitation  SUBJECTIVE:                                                                                                                                                                                           SUBJECTIVE STATEMENT: "How long am I going to have to do this for?"  PERTINENT HISTORY:  Patient was diagnosed on 01/23/2020 with left grade III invasive ductal carcinoma breast cancer. It is ER positive, PR negative, and HER2 negative , but functionally Triple Negative,with a Ki67 of 40%. Patient with interpreter present. She underwent neoadjuvant chemotherapy from 02/12/2020 - 05/28/2020. 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 She underwent a left lumpectomy and sentinel node biopsy ( 4 negative nodes) on 07/15/2020. She underwent radiation and anti-estrogen therapy.  She had interval development of diffuse skin thickening in 2023. She had a punch biopsy of her skin at that time and this was consistent with inflammatory breast cancer. She has been treated with chemo since then. .  She had a left modified radical mastectomy on 08/22/2022 with complete therapeutic response and 0/1 LN. She noted some swelling in her left arm and on 08/25/2022 she had an [place] that was negative for DVT. Her SOZO screen on 09/19/2021 was 6.9 points above baseline putting her in the yellow zone and she was given a compression sleeve.  PAIN:  Are you having pain? No just stiffness in 2nd digit when making a fist   PRECAUTIONS: Other: lymphedema [name]  WEIGHT BEARING RESTRICTIONS: No  FALLS:  Has patient fallen in last 6 months? 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  LEISURE: pt does not exercise  HAND DOMINANCE: right   PRIOR LEVEL OF FUNCTION: Independent  PATIENT GOALS: get the swelling down, improve L shoulder ROM, decrease pain   OBJECTIVE:  COGNITION: Overall cognitive status: Within functional limits for tasks assessed   PALPATION: Decreased scar mobility  OBSERVATIONS / OTHER ASSESSMENTS: scar well healed, increased edema at lateral trunk and throughout [name]  POSTURE: forward head, rounded shoulders  UPPER EXTREMITY AROM/PROM:  A/PROM RIGHT   eval   Shoulder extension 68  Shoulder flexion 160  Shoulder abduction 175  Shoulder internal rotation 56  Shoulder external rotation 88    (Blank rows = not tested)  A/PROM LEFT   eval  Shoulder extension 52  Shoulder flexion 146  Shoulder abduction 148  Shoulder internal rotation 67  Shoulder external rotation 86    (Blank rows = not tested)   LYMPHEDEMA ASSESSMENTS:  SURGERY TYPE/DATE: Left Lumpectomy with SLNB11/11/2019, Left modified mastectomy with left axillary node dissection NUMBER OF LYMPH NODES REMOVED: 0/4 with lumpectomy, 0/1 during Mastectomy CHEMOTHERAPY: yes RADIATION:Yes HORMONE TREATMENT: may require INFECTIONS: none  LYMPHEDEMA ASSESSMENTS:  LANDMARK RIGHT  eval  10 cm proximal to olecranon process 31.4  Olecranon process 27.2  10 cm proximal to ulnar styloid process 25.7  Just proximal to ulnar styloid process 18.4  Across hand at thumb web space 20  At base of 2nd digit 6.4  (Blank rows = not tested)  LANDMARK LEFT  eval LEFT 10/25/22  10 cm proximal to olecranon process 33 33  Olecranon process 28.4 28.5  10 cm proximal to ulnar styloid process 26 26.5   Just proximal to ulnar styloid process 19.4 19  Across hand at thumb web space 21 20.5   TODAY'S TREATMENT:                                                                                                                                         DATE:  10/27/22: Self Care Pt returns having asked about possible infection when at radiation and they were not concerned with this being a possibility. Also today she is not visibly red at lateral trunk. Pt was asking about how ling we thought the swelling would last so had an in depth conversation about how she has had 2 lymph node surgeries now and that she is at a very high risk for developing lymphedema. Also that we area seeming to be catching her while she is transitioning from subclinical lymphedema to lymphedema. Pt became tearful during session but was able to verbalize a good understanding and asked appropriate questions throughout. Also redid her SOZO and today her change from baseline was elevated back in the yellow. So decided that for now pt will cont to wear Medi Harmony prophylactic sleeve and gauntlet but that she will probably need to be measured for a flat knit sleeve  and glove soon. Explained difference between circular knit and flat knit garments by showing her a flat knit so she could feel difference of garments in comparison to what she has been wearing. Also pt is on board for more instruction in self bandaging as she may need to be doing this as radiation continues which could potentially worsen her lymphedema.  Manual Therapy MLD as time allowed at end of session to Lt UE as follows: Short neck, superficial abdominals and 5 diaphragmatic breaths, Lt inguinal and Rt axillary nodes, Lt axillo-inguinal and anterior inter-axillary anastomosis, then Lt UE working from proximal to distal then retracing all steps. Reviewed technique with pt and she was able to return good demo of correct pressure but did need hand over hand cuing for correct skin  stretch, not slide. Pt was then able to return good demo.  P/ROM to Lt shoulder briefly into flexion and abd with scapular depression by therapist throughout. She reports pect tendon tightness and her scapula is very elevated which is probably contributing to arm pain she mentions has been intermittent for weeks now.  STM along tight Lt pect tendon when in end range shoulder stretches  10/25/22-  See assessment - spent session doing education on subclinical lymphedema vs lymphedema, what the ldex screening score means, how visible swelling is usually indicative of lymphedema, how redness and pain are signs of infection and doing MLD and wearing compression can worsen it - sent inbasket message to Dr. Isidore Moos regarding redness/pain/swelling   10/19/22 MLD in supine: In supine: Short neck, 5 diaphragmatic breaths, superficial and deep abdominals, bil axillary nodes and establishment of interaxillary pathway, L inguinal nodes and establishment of axilloinguinal pathway, then L UE working proximal to distal, moving fluid from upper inner arm outwards, and doing both sides of forearm moving fluid towards pathways spending extra time in any areas of fibrosis then retracing all steps  Compression bandaging: TG soft from hand to axilla,  mollelast to digits 1-4 , artiflex from hand to axilla, 1 6 cm bandage at hand, 1 8 cm bandage from wrist to axilla and 1 12 cm bandage from wrist to axilla. Educated pt throughout on how to apply compression bandages and educated her to remove them if she has increased pain, her fingers turn blue or if she has any numbness that does not go away when she moves.    PATIENT EDUCATION:  Education details: lymphedema, anatomy and physiology of the lymphatic system, how lymphedema is treated, subclinical lymphedema vs lymphedema, need for compression bra Person educated: Patient Education method: Explanation Education comprehension: verbalized understanding  HOME EXERCISE  PROGRAM: Obtain compression bra, continue to wear sleeve and glove  ASSESSMENT:  CLINICAL IMPRESSION: See above under self care for education today with assistance of interpreter. After in depth discussion with pt, reviewing her medical history and also including Shan Levans, PT in discussion we have decided that it appears pt is beginning to transition from subclinical lymphedema to lymphedema but is in the very early stages of this. For now she is going to continue wearing her Medi Harmony compression sleeve and glove but will probably benefit from being measured for a flat knit sleeve and glove in near future that will be better containing. We plan to use [employer] funds for this. If pt is able to fit into an OTS daytime garments will see if she can also get a nighttime garment as well with the [employer] funds as pt will benefit greatly from this. She was tearful  during session briefly during discussion of lymphedema however was encouraged to know we are at least catching this very early which will make it easier for her to stay on top of treating. Pt will benefit from continued physical therapy at this time to cont to educate her on her progressing condition, self bandaging as her lymphedema may progress, and for therapeutic exercise for scapular strength to allow for increased pain free movements of her Lt shoulder.  OBJECTIVE IMPAIRMENTS: decreased knowledge of condition, decreased ROM, decreased strength, increased edema, increased fascial restrictions, impaired UE functional use, postural dysfunction, and pain.   ACTIVITY LIMITATIONS: carrying, lifting, and reach over head  PARTICIPATION LIMITATIONS: cleaning, laundry, community activity, occupation, and yard work  PERSONAL FACTORS: Time since onset of injury/illness/exacerbation are also affecting patient's functional outcome.   REHAB POTENTIAL: Good  CLINICAL DECISION MAKING: Stable/uncomplicated  EVALUATION COMPLEXITY: Low  GOALS: Goals  reviewed with patient? Yes   SHORT TERM GOALS = LONG TERM GOALS: Target date: 11/09/22  Pt will be independent in self MLD for long term management of lymphedema.  Baseline:  Goal status: INITIAL  2.  Pt will obtain appropriate compression garments for long term management of lymphedema.  Baseline:  Goal status: INITIAL  3.  Pt will demonstrate 160 degrees of L shoulder flexion to allow her to reach overhead. Baseline:  Goal status: INITIAL  4. Pt will demonstrate 170 degrees of L shoulder abduction to allow her to reach out to the side.   Goal: INITIAL  5. Pt will be independent in a home exercise program for continued stretching and strengthening.  Goal: INITIAL  6. Pt will report she is able to raise her L arm to end range wihtout increased pain in her axilla to allow improved comfort.   GOAL: INITIAL   PLAN:  PT FREQUENCY: 2x/week  PT DURATION: 4 weeks  PLANNED INTERVENTIONS: Therapeutic exercises, Therapeutic activity, Patient/Family education, Self Care, Joint mobilization, Manual lymph drainage, Compression bandaging, scar mobilization, Taping, Vasopneumatic device, and Manual therapy  PLAN FOR NEXT SESSION: Cont to instruct in UE bandaging; measure circumference of Lt and if close to her Rt then consider measuring her for a flat knit sleeve and glove, and nighttime as well if she fits into OTS for day using [employer] funds; cont MLD to L UE and lateral trunk and instructing pt, PROM for L UE and STM to pect tendon, when independent with bandaging and self MLD add ball and pulleys to instruct in scapular depression with active shoulder A/ROM and add supine scapular series   Otelia Limes, PTA 10/27/2022, 12:41 PM

## 2022-10-28 ENCOUNTER — Inpatient Hospital Stay: Payer: No Typology Code available for payment source | Attending: Hematology and Oncology | Admitting: Adult Health

## 2022-10-28 ENCOUNTER — Other Ambulatory Visit: Payer: Self-pay

## 2022-10-28 ENCOUNTER — Encounter: Payer: Self-pay | Admitting: Adult Health

## 2022-10-28 ENCOUNTER — Ambulatory Visit
Admission: RE | Admit: 2022-10-28 | Discharge: 2022-10-28 | Disposition: A | Discharge status: Home / Self Care | Payer: No Typology Code available for payment source | Source: Ambulatory Visit | Attending: Radiation Oncology | Admitting: Radiation Oncology

## 2022-10-28 VITALS — BP 126/85 | HR 85 | Temp 97.7°F | Resp 16 | Ht 61.0 in | Wt 191.7 lb

## 2022-10-28 DIAGNOSIS — Z9221 Personal history of antineoplastic chemotherapy: Secondary | ICD-10-CM | POA: Insufficient documentation

## 2022-10-28 DIAGNOSIS — Z79811 Long term (current) use of aromatase inhibitors: Secondary | ICD-10-CM | POA: Insufficient documentation

## 2022-10-28 DIAGNOSIS — M546 Pain in thoracic spine: Secondary | ICD-10-CM | POA: Insufficient documentation

## 2022-10-28 DIAGNOSIS — R5383 Other fatigue: Secondary | ICD-10-CM | POA: Insufficient documentation

## 2022-10-28 DIAGNOSIS — Z170 Estrogen receptor positive status [ER+]: Secondary | ICD-10-CM

## 2022-10-28 DIAGNOSIS — Z923 Personal history of irradiation: Secondary | ICD-10-CM | POA: Insufficient documentation

## 2022-10-28 DIAGNOSIS — C50412 Malignant neoplasm of upper-outer quadrant of left female breast: Secondary | ICD-10-CM | POA: Insufficient documentation

## 2022-10-28 DIAGNOSIS — L539 Erythematous condition, unspecified: Secondary | ICD-10-CM | POA: Insufficient documentation

## 2022-10-28 DIAGNOSIS — R519 Headache, unspecified: Secondary | ICD-10-CM | POA: Insufficient documentation

## 2022-10-28 DIAGNOSIS — Z8616 Personal history of COVID-19: Secondary | ICD-10-CM | POA: Insufficient documentation

## 2022-10-28 DIAGNOSIS — I890 Lymphedema, not elsewhere classified: Secondary | ICD-10-CM | POA: Insufficient documentation

## 2022-10-28 LAB — RAD ONC ARIA SESSION SUMMARY
Course Elapsed Days: 9
Plan Fractions Treated to Date: 4
Plan Prescribed Dose Per Fraction: 1.8 Gy
Plan Total Fractions Prescribed: 14
Plan Total Prescribed Dose: 25.2 Gy
Reference Point Dosage Given to Date: 7.2 Gy
Reference Point Session Dosage Given: 1.8 Gy
Session Number: 8

## 2022-10-28 NOTE — Progress Notes (Signed)
[name] Follow up:    Monica Foy, NP [address] [place] [address]   DIAGNOSIS:  Thomas Staging  Malignant neoplasm of upper-outer quadrant of left breast in female, estrogen receptor positive ([place]) Staging form: Breast, AJCC 8th Edition - Clinical stage from 01/29/2020: Stage IIIA (cT2, cN1, cM0, G3, ER+, PR-, HER2-) - Signed by Benay Pike, MD on 06/28/2022 Stage prefix: Initial diagnosis Histologic grading system: 3 grade system - Pathologic stage from 07/15/2020: No Stage Recommended (ypT1b, pN0, cM0, G3, ER-, PR-, HER2-) - Signed by Gardenia Phlegm, NP on 04/01/2022 Stage prefix: Post-therapy Histologic grading system: 3 grade system   SUMMARY OF ONCOLOGIC HISTORY: Oncology History  Malignant neoplasm of upper-outer quadrant of left breast in female, estrogen receptor positive ([place])  01/23/2020 Initial Diagnosis   [place] woman status post left breast upper outer quadrant biopsy 01/23/2020 for a clinical T3 N1, 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             (a) chest CT scan and bone scan 02/07/2020 showed no evidence of metastatic disease   01/29/2020 Thomas Staging   Staging form: Breast, AJCC 8th Edition - Clinical stage from 01/29/2020: Stage IIIA (cT2, cN1, cM0, G3, ER+, PR-, HER2-) - Signed by Benay Pike, MD on 06/28/2022 Stage prefix: Initial diagnosis Histologic grading system: 3 grade system   02/07/2020 Genetic Testing   Negative genetic testing. No pathogenic variants identified on the [facility] Common Hereditary Cancers Panel. VUS in MSH6 called c.2744C>G identified. The report date is 02/07/2020.  The Common Hereditary Cancers Panel offered by [facility] includes sequencing and/or deletion duplication testing of the following 48 genes: APC, ATM, AXIN2, BARD1, BMPR1A, BRCA1, BRCA2, BRIP1, CDH1, CDKN2A (p14ARF), CDKN2A (p16INK4a), CKD4, CHEK2, CTNNA1,  DICER1, EPCAM (Deletion/duplication testing only), GREM1 (promoter region deletion/duplication testing only), KIT, MEN1, MLH1, MSH2, MSH3, MSH6, MUTYH, NBN, NF1, NHTL1, PALB2, PDGFRA, PMS2, POLD1, POLE, PTEN, RAD50, RAD51C, RAD51D, RNF43, SDHB, SDHC, SDHD, SMAD4, SMARCA4. STK11, TP53, TSC1, TSC2, and VHL.  The following genes were evaluated for sequence changes only: SDHA and HOXB13 c.251G>A variant only.   02/12/2020 - 05/28/2020 Neo-Adjuvant Chemotherapy   neoadjuvant chemotherapy consisting of doxorubicin and cyclophosphamide in dose dense fashion x4 started 02/12/2020, completed 03/25/2020, followed by paclitaxel and carboplatin weekly x12 started 04/08/2020 and completed on 05/28/2020             (a) echo 02/07/2020 shows an ejection fraction in the 55-60% range             (b) Epirubicin substituted for Doxorubicin starting with cycle 2 of AC secondary to allergic rash from Doxorubicin   07/15/2020 Surgery   status post left lumpectomy and sentinel lymph node sampling 07/15/2020 for a ypT1b ypN0 residual invasive ductal carcinoma, grade 3, with negative margins.             (a) a total of 4 left axillary lymph nodes were removed             (b) repeat prognostic panel on the final pathology again triple negative, with an MIB-1 of 50%.   07/15/2020 Thomas Staging   Staging form: Breast, AJCC 8th Edition - Pathologic stage from 07/15/2020: No Stage Recommended (ypT1b, pN0, cM0, G3, ER-, PR-, HER2-) - Signed by Gardenia Phlegm, NP on 04/01/2022 Stage prefix: Post-therapy Histologic grading system: 3 grade system   08/26/2020 - 10/09/2020 Radiation Therapy   adjuvant radiation  completed 10/09/2020             (a) sensitizing capecitabine attempted but not tolerated by the patient  08/26/2020 through 10/09/2020 Site Technique Total Dose (Gy) Dose per Fx (Gy) Completed Fx Beam Energies  Breast, Left: Breast_Lt 3D 50/50 2 25/25 15X  Breast, Left: Breast_Lt_PAB_SCV 3D 50/50 2 25/25 10X, 15X   Breast, Left: Breast_Lt_Bst 3D 10/10 2 5/5 6X, 10X    11/23/2020 - 01/04/2021 Adjuvant Chemotherapy   adjuvant pembrolizumab started 11/23/2020, to continue for 1 year             (a) discontinued after 01/04/2021 dose with multiple side effects requiring steroids for resolution   03/30/2022 Mammogram   Mammogram and ultrasound show numerous masses throughout left breast not visualized in 01/2022, +left axillary lymphadenopathy, skin thickening in right breast and in upper abdomen just below sternum   04/05/2022 -  Chemotherapy   Patient is on Treatment Plan : BREAST METASTATIC Fam-Trastuzumab Deruxtecan-nxki (Enhertu) (5.4) q21d     04/06/2022 - 04/26/2022 Chemotherapy   Patient is on Treatment Plan : BREAST METASTATIC fam-trastuzumab deruxtecan-nxki (Enhertu) q21d     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  Prognostic panel sent to [facility]: Left breast skin, punch biopsy: Skin with dermal lymphatic involvement by carcinoma (confirmed with outside CK7 IHC, reviewed). Biomarkers are reported as negative for ER, PR, Her2/neu (confirmed on review but please note sample size is extremely small).   02/15/2022 - 03/08/2022 Chemotherapy    Taxotere/Cytoxan x 2, discontinued due to progression of breast.   03/30/2022 Mammogram   Mammogram and ultrasound show numerous masses throughout left breast not visualized in 01/2022, +left axillary lymphadenopathy, skin thickening in right breast and in upper abdomen just below sternum   04/05/2022 -  Chemotherapy   Patient is on Treatment Plan : BREAST METASTATIC Fam-Trastuzumab Deruxtecan-nxki (Enhertu) (5.4) q21d     04/06/2022 - 04/26/2022 Chemotherapy   Patient is on Treatment Plan : BREAST METASTATIC fam-trastuzumab deruxtecan-nxki (Enhertu) q21d       CURRENT THERAPY: Adjuvant radiation  INTERVAL HISTORY: [name] 47 y.o. female returns for  follow-up and evaluation of her recurrent breast Thomas.  She is currently undergoing adjuvant radiation and is tolerating it moderately well.  She tells me that her skin has some erythema but otherwise she is not having any peeling or signs of desquamation.  She is walking about 30 minutes a day 2 to 3 days a week.  She is mildly fatigued.  She has some soreness in her left arm from the lymphedema and some mild upper back pain and intermittent headaches that have been evaluated with imaging.  She is drinking about 3-4 bottles of water a day and is otherwise doing moderately well.   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[place]) 04/01/2022   Encounter for dental examination 03/11/2022   Acute apical periodontitis 03/11/2022   Phobia of dental procedure 03/11/2022   Defective dental restoration 03/11/2022   Accretions on teeth 03/11/2022   Chronic periodontitis 03/11/2022   Teeth missing 03/11/2022   Caries 03/11/2022   Generalized gingival recession 03/11/2022   Pain, dental 03/08/2022   Thomas-related pain 03/08/2022   Port-A-Cath in place 02/15/2022  Oral allergy syndrome, subsequent encounter 02/03/2022   Adverse effect of other drugs, medicaments and biological substances, subsequent encounter 02/02/2022   Other adverse food reactions, not elsewhere classified, subsequent encounter 02/02/2022   Chest tightness 02/02/2022   Other allergic rhinitis 02/02/2022   Allergic conjunctivitis of both eyes 02/02/2022   Language barrier 12/11/2020   Morbid obesity ([place])    History of breast Thomas    Genetic testing 02/13/2020   Malignant neoplasm of upper-outer quadrant of left breast in female, estrogen receptor positive ([place]) 01/27/2020   Impaired ability to use community resources due to  language barrier 07/13/2013    is allergic to omnipaque [iohexol], doxorubicin hcl, keytruda [pembrolizumab], other, and tape.  MEDICAL HISTORY: Past Medical History:  Diagnosis Date   Allergy    Asthma    Breast Thomas in female [facility])    Left   Breast disorder    breast Thomas May 2021   Chronic back pain    Headache    High triglycerides    History of breast Thomas    History of COVID-19 04/20/2021   Hx of migraines    Morbid obesity ([place])    Personal history of chemotherapy    Personal history of radiation therapy     SURGICAL HISTORY: Past Surgical History:  Procedure Laterality Date   BREAST LUMPECTOMY     BREAST LUMPECTOMY WITH RADIOACTIVE SEED AND SENTINEL LYMPH NODE BIOPSY Left 07/15/2020   Procedure: LEFT BREAST LUMPECTOMY WITH RADIOACTIVE SEED AND LEFT AXILLARY SENTINEL LYMPH NODE BIOPSY, LEFT AXILLARY NODE RADIOACTIVE SEED GUIDED EXCISION, LEFT BREAST RADIOACTIVE SEED GUIDED EXCISION IM NODE;  Surgeon: Rolm Bookbinder, MD;  Location: [place];  Service: General;  Laterality: Left;  PEC BLOCK   CESAREAN SECTION WITH BILATERAL TUBAL LIGATION Bilateral 07/23/2015   Procedure: CESAREAN SECTION WITH BILATERAL TUBAL LIGATION;  Surgeon: Donnamae Jude, MD;  Location: [facility];  Service: Obstetrics;  Laterality: Bilateral;   IR IMAGING GUIDED PORT INSERTION  02/11/2022   MODIFIED MASTECTOMY Left 08/22/2022   Procedure: LEFT MODIFIED RADICAL MASTECTOMY, LEFT AXILLARY NODE DISSECTION, AND SKIN BIOPSY OF BACK;  Surgeon: Rolm Bookbinder, MD;  Location: [facility];  Service: General;  Laterality: Left;   PORT-A-CATH REMOVAL N/A 04/30/2021   Procedure: REMOVAL PORT-A-CATH;  Surgeon: Rolm Bookbinder, MD;  Location: [place];  Service: General;  Laterality: N/A;   PORTACATH PLACEMENT Right 02/11/2020   Procedure: INSERTION PORT-A-CATH WITH ULTRASOUND GUIDANCE;  Surgeon: Rolm Bookbinder, MD;  Location: [facility];  Service: General;  Laterality: Right;    TUBAL LIGATION      SOCIAL HISTORY: Social History   Socioeconomic History   Marital status: Legally Separated    Spouse name: Not on file   Number of children: 3   Years of education: Not on file   Highest education level: 9th grade  Occupational History   Occupation: Architect  Tobacco Use   Smoking status: Never   Smokeless tobacco: Never  Vaping Use   Vaping Use: Never used  Substance and Sexual Activity   Alcohol use: No   Drug use: Never   Sexual activity: Yes    Birth control/protection: Surgical  Other Topics Concern   Not on file  Social History Narrative   Not on file   Social Determinants of Health   Financial Resource Strain: Not on file  Food Insecurity: No Food Insecurity (10/11/2022)   Hunger Vital Sign    Worried About Running Out of Food in the Last Year:  Never true    Ran Out of Food in the Last Year: Never true  Transportation Needs: No Transportation Needs (10/11/2022)   PRAPARE - Hydrologist (Medical): No    Lack of Transportation (Non-Medical): No  Physical Activity: Not on file  Stress: Not on file  Social Connections: Not on file  Intimate Partner Violence: Not At Risk (10/11/2022)   Humiliation, Afraid, Rape, and Kick questionnaire    Fear of Current or Ex-Partner: No    Emotionally Abused: No    Physically Abused: No    Sexually Abused: No    FAMILY HISTORY: Family History  Problem Relation Age of Onset   Heart disease Maternal Grandmother     Review of Systems  Constitutional:  Positive for fatigue. Negative for appetite change, chills, fever and unexpected weight change.  HENT:   Negative for hearing loss, lump/mass and trouble swallowing.   Eyes:  Negative for eye problems and icterus.  Respiratory:  Negative for chest tightness, cough and shortness of breath.   Cardiovascular:  Negative for chest pain, leg swelling and palpitations.  Gastrointestinal:  Negative for abdominal distention,  abdominal pain, constipation, diarrhea, nausea and vomiting.  Endocrine: Negative for hot flashes.  Genitourinary:  Negative for difficulty urinating.   Musculoskeletal:  Negative for arthralgias.  Skin:  Negative for itching and rash.  Neurological:  Negative for dizziness, extremity weakness, headaches and numbness.  Hematological:  Negative for adenopathy. Does not bruise/bleed easily.  Psychiatric/Behavioral:  Negative for depression. The patient is not nervous/anxious.       PHYSICAL EXAMINATION  ECOG PERFORMANCE STATUS: 1 - Symptomatic but completely ambulatory  Vitals:   10/28/22 1142  BP: 126/85  Pulse: 85  Resp: 16  Temp: 97.7 F (36.5 C)  SpO2: 100%    Physical Exam Constitutional:      General: She is not in acute distress.    Appearance: Normal appearance. She is not toxic-appearing.  HENT:     Head: Normocephalic and atraumatic.  Eyes:     General: No scleral icterus. Cardiovascular:     Rate and Rhythm: Normal rate and regular rhythm.     Pulses: Normal pulses.     Heart sounds: Normal heart sounds.  Pulmonary:     Effort: Pulmonary effort is normal.     Breath sounds: Normal breath sounds.  Abdominal:     General: Abdomen is flat. Bowel sounds are normal. There is no distension.     Palpations: Abdomen is soft.     Tenderness: There is no abdominal tenderness.  Musculoskeletal:        General: Swelling (left arm swelling) present.     Cervical back: Neck supple.  Lymphadenopathy:     Cervical: No cervical adenopathy.  Skin:    General: Skin is warm and dry.     Findings: No rash.  Neurological:     General: No focal deficit present.     Mental Status: She is alert.  Psychiatric:        Mood and Affect: Mood normal.        Behavior: Behavior normal.     LABORATORY DATA: None for this visit   ASSESSMENT and THERAPY PLAN:   Malignant neoplasm of upper-outer quadrant of left breast in female, estrogen receptor positive ([place]) Charlana is a  47 year old woman with ER positive breast Thomas currently receiving treatment with adjuvant radiation therapy.  She finishes radiation therapy in March of this year.  At that time  she will meet with Dr. Thera Flake to discuss adjuvant therapy including capecitabine and antiestrogen therapy.  We discussed her fatigue which can be managed with exercise which she is doing.  I encouraged her to continue to do this.  I also suggested she try taking a Tylenol tablet in the evening that can help with some of her upper back discomfort and help her sleep better which can also help her headaches.  Overall she is doing well and has no clinical signs of breast Thomas recurrence.  We will see her back in 4 weeks for follow-up and further discussion.  She knows to call for any questions or concerns that may arise between now and her next visit.   All questions were answered. The patient knows to call the clinic with any problems, questions or concerns. We can certainly see the patient much sooner if necessary.  Total encounter time:20 minutes*in face-to-face visit time, chart review, lab review, care coordination, order entry, and documentation of the encounter time.    Wilber Bihari, NP 10/28/22 12:43 PM Medical Oncology and Hematology [facility] [place], [place] [address] Tel. [phone]    Fax. [phone]  *Total Encounter Time as defined by the Centers for Medicare and Medicaid Services includes, in addition to the face-to-face time of a patient visit (documented in the note above) non-face-to-face time: obtaining and reviewing outside history, ordering and reviewing medications, tests or procedures, care coordination (communications with other health care professionals or caregivers) and documentation in the medical record.

## 2022-10-28 NOTE — Assessment & Plan Note (Signed)
[name] is a 47 year old woman with ER positive breast cancer currently receiving treatment with adjuvant radiation therapy.  She finishes radiation therapy in March of this year.  At that time she will meet with Dr. Thera Flake to discuss adjuvant therapy including capecitabine and antiestrogen therapy.  We discussed her fatigue which can be managed with exercise which she is doing.  I encouraged her to continue to do this.  I also suggested she try taking a Tylenol tablet in the evening that can help with some of her upper back discomfort and help her sleep better which can also help her headaches.  Overall she is doing well and has no clinical signs of breast cancer recurrence.  We will see her back in 4 weeks for follow-up and further discussion.  She knows to call for any questions or concerns that may arise between now and her next visit.

## 2022-10-31 ENCOUNTER — Ambulatory Visit
Admission: RE | Admit: 2022-10-31 | Discharge: 2022-10-31 | Disposition: A | Discharge status: Home / Self Care | Payer: No Typology Code available for payment source | Source: Ambulatory Visit | Attending: Radiation Oncology

## 2022-10-31 ENCOUNTER — Ambulatory Visit
Admission: RE | Admit: 2022-10-31 | Discharge: 2022-10-31 | Disposition: A | Discharge status: Home / Self Care | Payer: No Typology Code available for payment source | Source: Ambulatory Visit | Attending: Radiation Oncology | Admitting: Radiation Oncology

## 2022-10-31 ENCOUNTER — Other Ambulatory Visit: Payer: Self-pay

## 2022-10-31 LAB — RAD ONC ARIA SESSION SUMMARY
Course Elapsed Days: 12
Plan Fractions Treated to Date: 5
Plan Prescribed Dose Per Fraction: 1.8 Gy
Plan Total Fractions Prescribed: 14
Plan Total Prescribed Dose: 25.2 Gy
Reference Point Dosage Given to Date: 9 Gy
Reference Point Session Dosage Given: 1.8 Gy
Session Number: 9

## 2022-11-01 ENCOUNTER — Ambulatory Visit: Payer: No Typology Code available for payment source | Admitting: Physical Therapy

## 2022-11-01 ENCOUNTER — Encounter: Payer: Self-pay | Admitting: Physical Therapy

## 2022-11-01 ENCOUNTER — Other Ambulatory Visit: Payer: Self-pay

## 2022-11-01 ENCOUNTER — Ambulatory Visit
Admission: RE | Admit: 2022-11-01 | Discharge: 2022-11-01 | Disposition: A | Discharge status: Home / Self Care | Payer: No Typology Code available for payment source | Source: Ambulatory Visit | Attending: Radiation Oncology | Admitting: Radiation Oncology

## 2022-11-01 DIAGNOSIS — Z170 Estrogen receptor positive status [ER+]: Secondary | ICD-10-CM

## 2022-11-01 DIAGNOSIS — M25612 Stiffness of left shoulder, not elsewhere classified: Secondary | ICD-10-CM

## 2022-11-01 DIAGNOSIS — R293 Abnormal posture: Secondary | ICD-10-CM

## 2022-11-01 DIAGNOSIS — I972 Postmastectomy lymphedema syndrome: Secondary | ICD-10-CM

## 2022-11-01 DIAGNOSIS — Z483 Aftercare following surgery for neoplasm: Secondary | ICD-10-CM

## 2022-11-01 LAB — RAD ONC ARIA SESSION SUMMARY
Course Elapsed Days: 13
Plan Fractions Treated to Date: 5
Plan Prescribed Dose Per Fraction: 1.8 Gy
Plan Total Fractions Prescribed: 14
Plan Total Prescribed Dose: 25.2 Gy
Reference Point Dosage Given to Date: 9 Gy
Reference Point Session Dosage Given: 1.8 Gy
Session Number: 10

## 2022-11-01 NOTE — Therapy (Signed)
OUTPATIENT PHYSICAL THERAPY ONCOLOGY TREATMENT  Patient Name: [name] MRN: [id number] DOB:04-05-1976, 47 y.o., female Today's Date: 11/01/2022  END OF SESSION:  PT End of Session - 11/01/22 1105     Visit Number 7    Number of Visits 9    Date for PT Re-Evaluation 11/09/22    PT Start Time 1104    PT Stop Time 1203    PT Time Calculation (min) 59 min    Activity Tolerance Patient tolerated treatment well    Behavior During Therapy [facility] for tasks assessed/performed                Past Medical History:  Diagnosis Date   Allergy    Asthma    Breast cancer in female [facility])    Left   Breast disorder    breast cancer May 2021   Chronic back pain    Headache    High triglycerides    History of breast cancer    History of COVID-19 04/20/2021   Hx of migraines    Morbid obesity ([place])    Personal history of chemotherapy    Personal history of radiation therapy    Past Surgical History:  Procedure Laterality Date   BREAST LUMPECTOMY     BREAST LUMPECTOMY WITH RADIOACTIVE SEED AND SENTINEL LYMPH NODE BIOPSY Left 07/15/2020   Procedure: LEFT BREAST LUMPECTOMY WITH RADIOACTIVE SEED AND LEFT AXILLARY SENTINEL LYMPH NODE BIOPSY, LEFT AXILLARY NODE RADIOACTIVE SEED GUIDED EXCISION, LEFT BREAST RADIOACTIVE SEED GUIDED EXCISION IM NODE;  Surgeon: Rolm Bookbinder, MD;  Location: [place];  Service: General;  Laterality: Left;  PEC BLOCK   CESAREAN SECTION WITH BILATERAL TUBAL LIGATION Bilateral 07/23/2015   Procedure: CESAREAN SECTION WITH BILATERAL TUBAL LIGATION;  Surgeon: Donnamae Jude, MD;  Location: [facility];  Service: Obstetrics;  Laterality: Bilateral;   IR IMAGING GUIDED PORT INSERTION  02/11/2022   MODIFIED MASTECTOMY Left 08/22/2022   Procedure: LEFT MODIFIED RADICAL MASTECTOMY, LEFT AXILLARY NODE DISSECTION, AND SKIN BIOPSY OF BACK;  Surgeon: Rolm Bookbinder, MD;  Location: [facility];  Service: General;  Laterality: Left;   PORT-A-CATH REMOVAL N/A 04/30/2021    Procedure: REMOVAL PORT-A-CATH;  Surgeon: Rolm Bookbinder, MD;  Location: [place];  Service: General;  Laterality: N/A;   PORTACATH PLACEMENT Right 02/11/2020   Procedure: INSERTION PORT-A-CATH WITH ULTRASOUND GUIDANCE;  Surgeon: Rolm Bookbinder, MD;  Location: [place];  Service: General;  Laterality: Right;   TUBAL LIGATION     Patient Active Problem List   Diagnosis Date Noted   Health care maintenance 09/30/2022   S/P left mastectomy 08/22/2022   Burning with urination 06/01/2022   Swelling of left upper extremity 05/18/2022   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[place]) 04/01/2022   Encounter for dental examination 03/11/2022   Acute apical periodontitis 03/11/2022   Phobia of dental procedure 03/11/2022   Defective dental restoration 03/11/2022   Accretions on teeth 03/11/2022   Chronic periodontitis 03/11/2022   Teeth missing 03/11/2022   Caries 03/11/2022   Generalized gingival recession 03/11/2022   Pain, dental 03/08/2022   Cancer-related pain 03/08/2022   Port-A-Cath in place 02/15/2022   Oral allergy syndrome, subsequent encounter 02/03/2022   Adverse effect of other drugs, medicaments and biological substances, subsequent encounter 02/02/2022   Other adverse food reactions, not elsewhere classified, subsequent encounter 02/02/2022   Chest tightness 02/02/2022   Other allergic rhinitis 02/02/2022   Allergic conjunctivitis of both eyes 02/02/2022   Language barrier 12/11/2020  Morbid obesity ([place])    History of breast cancer    Genetic testing 02/13/2020   Malignant neoplasm of upper-outer quadrant of left breast in female, estrogen receptor positive ([place]) 01/27/2020   Impaired ability to use community resources due to language barrier 07/13/2013    PCP: Lazaro Arms, NP  REFERRING PROVIDER: Benay Pike, MD  REFERRING DIAG: Left UE lymphedema  THERAPY DIAG:  Postmastectomy lymphedema  Stiffness of left shoulder, not  elsewhere classified  Aftercare following surgery for neoplasm  Abnormal posture  Malignant neoplasm of upper-outer quadrant of left breast in female, estrogen receptor positive ([place])  ONSET DATE: 08/01/22  Rationale for Evaluation and Treatment: Rehabilitation  SUBJECTIVE:                                                                                                                                                                                           SUBJECTIVE STATEMENT: The hand swelling is the same. None of the swelling is getting better.   PERTINENT HISTORY:  Patient was diagnosed on 01/23/2020 with left grade III invasive ductal carcinoma breast cancer. It is ER positive, PR negative, and HER2 negative , but functionally Triple Negative,with a Ki67 of 40%. Patient with interpreter present. She underwent neoadjuvant chemotherapy from 02/12/2020 - 05/28/2020. 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 She underwent a left lumpectomy and sentinel node biopsy ( 4 negative nodes) on 07/15/2020. She underwent radiation and anti-estrogen therapy.  She had interval development of diffuse skin thickening in 2023. She had a punch biopsy of her skin at that time and this was consistent with inflammatory breast cancer. She has been treated with chemo since then. .  She had a left modified radical mastectomy on 08/22/2022 with complete therapeutic response and 0/1 LN. She noted some swelling in her left arm and on 08/25/2022 she had an [place] that was negative for DVT. Her SOZO screen on 09/19/2021 was 6.9 points above baseline putting her in the yellow zone and she was given a compression sleeve.  PAIN:  Are you having pain? No but arm hurts sometimes feels heavy, no particular time of day, intermittent  PRECAUTIONS: Other: lymphedema [name]  WEIGHT BEARING RESTRICTIONS: No  FALLS:  Has patient fallen in last 6 months? 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  LEISURE: pt does not exercise  HAND DOMINANCE: right   PRIOR LEVEL OF FUNCTION: Independent  PATIENT GOALS: get the swelling down, improve L shoulder ROM, decrease pain   OBJECTIVE:  COGNITION: Overall cognitive status: Within functional limits for tasks assessed   PALPATION: Decreased scar mobility  OBSERVATIONS / OTHER ASSESSMENTS: scar well healed, increased edema at lateral trunk and throughout [name]  POSTURE: forward head, rounded shoulders  UPPER EXTREMITY AROM/PROM:  A/PROM RIGHT   eval   Shoulder extension 68  Shoulder flexion 160  Shoulder abduction 175  Shoulder internal rotation 56  Shoulder external rotation 88    (Blank rows = not tested)  A/PROM LEFT   eval  Shoulder extension 52  Shoulder flexion 146  Shoulder abduction 148  Shoulder internal rotation 67  Shoulder external rotation 86    (Blank rows = not tested)   LYMPHEDEMA ASSESSMENTS:  SURGERY TYPE/DATE: Left Lumpectomy with SLNB11/11/2019, Left modified mastectomy with left axillary node dissection NUMBER OF LYMPH NODES REMOVED: 0/4 with lumpectomy, 0/1 during Mastectomy CHEMOTHERAPY: yes RADIATION:Yes HORMONE TREATMENT: may require INFECTIONS: none  LYMPHEDEMA ASSESSMENTS:  LANDMARK RIGHT  eval  10 cm proximal to olecranon process 31.4  Olecranon process 27.2  10 cm proximal to ulnar styloid process 25.7  Just proximal to ulnar styloid process 18.4  Across hand at thumb web space 20  At base of 2nd digit 6.4  (Blank rows = not tested)  LANDMARK LEFT  eval LEFT 10/25/22  10 cm proximal to olecranon process 33 33  Olecranon process 28.4 28.5  10 cm proximal to ulnar styloid  process 26 26.5  Just proximal to ulnar styloid process 19.4 19  Across hand at thumb web space 21 20.5   TODAY'S TREATMENT:                                                                                                                                         DATE:  11/01/22 Spent session instructing pt in how to apply compression bandaging independently at home as follows : TG soft from hand to axilla,  mollelast to digits 1-4 , artiflex from hand to axilla, 1 6 cm bandage at hand, 1 8 cm bandage from wrist to axilla with x at antecubital fossa and 1 12 cm bandage from wrist to axilla. Therapist provided v/c and t/c cues throughout and her interpreter typed out the instructions in Spanish. Educated pt to remove bandages and reapply prior to next appt so therapist can see how she applies them.   10/27/22: Self Care Pt returns having asked about possible infection when at radiation and they were not concerned with this being a possibility. Also today she is not visibly red at lateral trunk. Pt was asking about how ling we thought the swelling would last so had an in depth conversation about how she has had 2 lymph node surgeries now  and that she is at a very high risk for developing lymphedema. Also that we area seeming to be catching her while she is transitioning from subclinical lymphedema to lymphedema. Pt became tearful during session but was able to verbalize a good understanding and asked appropriate questions throughout. Also redid her SOZO and today her change from baseline was elevated back in the yellow. So decided that for now pt will cont to wear [employer] prophylactic sleeve and gauntlet but that she will probably need to be measured for a flat knit sleeve and glove soon. Explained difference between circular knit and flat knit garments by showing her a flat knit so she could feel difference of garments in comparison to what she has been wearing. Also pt is on board for more instruction in  self bandaging as she may need to be doing this as radiation continues which could potentially worsen her lymphedema.  Manual Therapy MLD as time allowed at end of session to Lt UE as follows: Short neck, superficial abdominals and 5 diaphragmatic breaths, Lt inguinal and Rt axillary nodes, Lt axillo-inguinal and anterior inter-axillary anastomosis, then Lt UE working from proximal to distal then retracing all steps. Reviewed technique with pt and she was able to return good demo of correct pressure but did need hand over hand cuing for correct skin stretch, not slide. Pt was then able to return good demo.  P/ROM to Lt shoulder briefly into flexion and abd with scapular depression by therapist throughout. She reports pect tendon tightness and her scapula is very elevated which is probably contributing to arm pain she mentions has been intermittent for weeks now.  STM along tight Lt pect tendon when in end range shoulder stretches  10/25/22-  See assessment - spent session doing education on subclinical lymphedema vs lymphedema, what the ldex screening score means, how visible swelling is usually indicative of lymphedema, how redness and pain are signs of infection and doing MLD and wearing compression can worsen it - sent inbasket message to Dr. Isidore Moos regarding redness/pain/swelling   10/19/22 MLD in supine: In supine: Short neck, 5 diaphragmatic breaths, superficial and deep abdominals, bil axillary nodes and establishment of interaxillary pathway, L inguinal nodes and establishment of axilloinguinal pathway, then L UE working proximal to distal, moving fluid from upper inner arm outwards, and doing both sides of forearm moving fluid towards pathways spending extra time in any areas of fibrosis then retracing all steps  Compression bandaging: TG soft from hand to axilla,  mollelast to digits 1-4 , artiflex from hand to axilla, 1 6 cm bandage at hand, 1 8 cm bandage from wrist to axilla and 1 12 cm bandage  from wrist to axilla. Educated pt throughout on how to apply compression bandages and educated her to remove them if she has increased pain, her fingers turn blue or if she has any numbness that does not go away when she moves.    PATIENT EDUCATION:  Education details: lymphedema, anatomy and physiology of the lymphatic system, how lymphedema is treated, subclinical lymphedema vs lymphedema, need for compression bra Person educated: Patient Education method: Explanation Education comprehension: verbalized understanding  HOME EXERCISE PROGRAM: Obtain compression bra, continue to wear sleeve and glove  ASSESSMENT:  CLINICAL IMPRESSION: Focused today on instructing pt in correct way to apply compression bandaging so she is able to bandage her arm at home. She was able to follow v/c and t/c to don bandages correctly today. Her interpretor typed out instructions in spanish for  her to follow at home. Will assess how she applied bandages at next session.   OBJECTIVE IMPAIRMENTS: decreased knowledge of condition, decreased ROM, decreased strength, increased edema, increased fascial restrictions, impaired UE functional use, postural dysfunction, and pain.   ACTIVITY LIMITATIONS: carrying, lifting, and reach over head  PARTICIPATION LIMITATIONS: cleaning, laundry, community activity, occupation, and yard work  PERSONAL FACTORS: Time since onset of injury/illness/exacerbation are also affecting patient's functional outcome.   REHAB POTENTIAL: Good  CLINICAL DECISION MAKING: Stable/uncomplicated  EVALUATION COMPLEXITY: Low  GOALS: Goals reviewed with patient? Yes   SHORT TERM GOALS = LONG TERM GOALS: Target date: 11/09/22  Pt will be independent in self MLD for long term management of lymphedema.  Baseline:  Goal status: INITIAL  2.  Pt will obtain appropriate compression garments for long term management of lymphedema.  Baseline:  Goal status: INITIAL  3.  Pt will demonstrate 160  degrees of L shoulder flexion to allow her to reach overhead. Baseline:  Goal status: INITIAL  4. Pt will demonstrate 170 degrees of L shoulder abduction to allow her to reach out to the side.   Goal: INITIAL  5. Pt will be independent in a home exercise program for continued stretching and strengthening.  Goal: INITIAL  6. Pt will report she is able to raise her L arm to end range wihtout increased pain in her axilla to allow improved comfort.   GOAL: INITIAL   PLAN:  PT FREQUENCY: 2x/week  PT DURATION: 4 weeks  PLANNED INTERVENTIONS: Therapeutic exercises, Therapeutic activity, Patient/Family education, Self Care, Joint mobilization, Manual lymph drainage, Compression bandaging, scar mobilization, Taping, Vasopneumatic device, and Manual therapy  PLAN FOR NEXT SESSION: Cont to instruct in UE bandaging; measure circumference of Lt and if close to her Rt then consider measuring her for a flat knit sleeve and glove, and nighttime as well if she fits into OTS for day using [employer]; cont MLD to L UE and lateral trunk and instructing pt, PROM for L UE and STM to pect tendon, when independent with bandaging and self MLD add ball and pulleys to instruct in scapular depression with active shoulder A/ROM and add supine scapular series   Manus Gunning, PT 11/01/2022, 1:13 PM

## 2022-11-02 ENCOUNTER — Ambulatory Visit
Admission: RE | Admit: 2022-11-02 | Discharge: 2022-11-02 | Disposition: A | Discharge status: Home / Self Care | Payer: No Typology Code available for payment source | Source: Ambulatory Visit | Attending: Radiation Oncology

## 2022-11-02 ENCOUNTER — Other Ambulatory Visit: Payer: Self-pay

## 2022-11-02 LAB — RAD ONC ARIA SESSION SUMMARY
Course Elapsed Days: 14
Plan Fractions Treated to Date: 6
Plan Prescribed Dose Per Fraction: 1.8 Gy
Plan Total Fractions Prescribed: 14
Plan Total Prescribed Dose: 25.2 Gy
Reference Point Dosage Given to Date: 10.8 Gy
Reference Point Session Dosage Given: 1.8 Gy
Session Number: 11

## 2022-11-03 ENCOUNTER — Ambulatory Visit: Payer: No Typology Code available for payment source

## 2022-11-03 ENCOUNTER — Ambulatory Visit
Admission: RE | Admit: 2022-11-03 | Discharge: 2022-11-03 | Disposition: A | Discharge status: Home / Self Care | Payer: No Typology Code available for payment source | Source: Ambulatory Visit | Attending: Radiation Oncology | Admitting: Radiation Oncology

## 2022-11-03 ENCOUNTER — Other Ambulatory Visit: Payer: Self-pay

## 2022-11-03 DIAGNOSIS — R293 Abnormal posture: Secondary | ICD-10-CM

## 2022-11-03 DIAGNOSIS — Z170 Estrogen receptor positive status [ER+]: Secondary | ICD-10-CM

## 2022-11-03 DIAGNOSIS — Z483 Aftercare following surgery for neoplasm: Secondary | ICD-10-CM

## 2022-11-03 DIAGNOSIS — I972 Postmastectomy lymphedema syndrome: Secondary | ICD-10-CM

## 2022-11-03 DIAGNOSIS — M25612 Stiffness of left shoulder, not elsewhere classified: Secondary | ICD-10-CM

## 2022-11-03 LAB — RAD ONC ARIA SESSION SUMMARY
Course Elapsed Days: 15
Plan Fractions Treated to Date: 6
Plan Prescribed Dose Per Fraction: 1.8 Gy
Plan Total Fractions Prescribed: 14
Plan Total Prescribed Dose: 25.2 Gy
Reference Point Dosage Given to Date: 10.8 Gy
Reference Point Session Dosage Given: 1.8 Gy
Session Number: 12

## 2022-11-03 NOTE — Therapy (Signed)
OUTPATIENT PHYSICAL THERAPY ONCOLOGY TREATMENT  Patient Name: [name] MRN: [id number] DOB:May 27, 1976, 47 y.o., female Today's Date: 11/03/2022  END OF SESSION:  PT End of Session - 11/03/22 1109     Visit Number 8    Number of Visits 9    Date for PT Re-Evaluation 11/09/22    PT Start Time 1106    PT Stop Time 1209    PT Time Calculation (min) 63 min    Activity Tolerance Patient tolerated treatment well    Behavior During Therapy [facility] for tasks assessed/performed                Past Medical History:  Diagnosis Date   Allergy    Asthma    Breast cancer in female [facility])    Left   Breast disorder    breast cancer May 2021   Chronic back pain    Headache    High triglycerides    History of breast cancer    History of COVID-19 04/20/2021   Hx of migraines    Morbid obesity ([place])    Personal history of chemotherapy    Personal history of radiation therapy    Past Surgical History:  Procedure Laterality Date   BREAST LUMPECTOMY     BREAST LUMPECTOMY WITH RADIOACTIVE SEED AND SENTINEL LYMPH NODE BIOPSY Left 07/15/2020   Procedure: LEFT BREAST LUMPECTOMY WITH RADIOACTIVE SEED AND LEFT AXILLARY SENTINEL LYMPH NODE BIOPSY, LEFT AXILLARY NODE RADIOACTIVE SEED GUIDED EXCISION, LEFT BREAST RADIOACTIVE SEED GUIDED EXCISION IM NODE;  Surgeon: Rolm Bookbinder, MD;  Location: [facility];  Service: General;  Laterality: Left;  PEC BLOCK   CESAREAN SECTION WITH BILATERAL TUBAL LIGATION Bilateral 07/23/2015   Procedure: CESAREAN SECTION WITH BILATERAL TUBAL LIGATION;  Surgeon: Donnamae Jude, MD;  Location: [facility];  Service: Obstetrics;  Laterality: Bilateral;   IR IMAGING GUIDED PORT INSERTION  02/11/2022   MODIFIED MASTECTOMY Left 08/22/2022   Procedure: LEFT MODIFIED RADICAL MASTECTOMY, LEFT AXILLARY NODE DISSECTION, AND SKIN BIOPSY OF BACK;  Surgeon: Rolm Bookbinder, MD;  Location: [facility];  Service: General;  Laterality: Left;   PORT-A-CATH REMOVAL N/A 04/30/2021    Procedure: REMOVAL PORT-A-CATH;  Surgeon: Rolm Bookbinder, MD;  Location: [place];  Service: General;  Laterality: N/A;   PORTACATH PLACEMENT Right 02/11/2020   Procedure: INSERTION PORT-A-CATH WITH ULTRASOUND GUIDANCE;  Surgeon: Rolm Bookbinder, MD;  Location: [facility];  Service: General;  Laterality: Right;   TUBAL LIGATION     Patient Active Problem List   Diagnosis Date Noted   Health care maintenance 09/30/2022   S/P left mastectomy 08/22/2022   Burning with urination 06/01/2022   Swelling of left upper extremity 05/18/2022   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[place]) 04/01/2022   Encounter for dental examination 03/11/2022   Acute apical periodontitis 03/11/2022   Phobia of dental procedure 03/11/2022   Defective dental restoration 03/11/2022   Accretions on teeth 03/11/2022   Chronic periodontitis 03/11/2022   Teeth missing 03/11/2022   Caries 03/11/2022   Generalized gingival recession 03/11/2022   Pain, dental 03/08/2022   Cancer-related pain 03/08/2022   Port-A-Cath in place 02/15/2022   Oral allergy syndrome, subsequent encounter 02/03/2022   Adverse effect of other drugs, medicaments and biological substances, subsequent encounter 02/02/2022   Other adverse food reactions, not elsewhere classified, subsequent encounter 02/02/2022   Chest tightness 02/02/2022   Other allergic rhinitis 02/02/2022   Allergic conjunctivitis of both eyes 02/02/2022   Language barrier 12/11/2020  Morbid obesity ([place])    History of breast cancer    Genetic testing 02/13/2020   Malignant neoplasm of upper-outer quadrant of left breast in female, estrogen receptor positive ([place]) 01/27/2020   Impaired ability to use community resources due to language barrier 07/13/2013    PCP: Lazaro Arms, NP  REFERRING PROVIDER: Benay Pike, MD  REFERRING DIAG: Left UE lymphedema  THERAPY DIAG:  Postmastectomy lymphedema  Stiffness of left shoulder, not  elsewhere classified  Aftercare following surgery for neoplasm  Abnormal posture  Malignant neoplasm of upper-outer quadrant of left breast in female, estrogen receptor positive ([place])  ONSET DATE: 08/01/22  Rationale for Evaluation and Treatment: Rehabilitation  SUBJECTIVE:                                                                                                                                                                                           SUBJECTIVE STATEMENT: I rewrapped my arm yesterday and I think I did okay but maybe not 100% right.   PERTINENT HISTORY:  Patient was diagnosed on 01/23/2020 with left grade III invasive ductal carcinoma breast cancer. It is ER positive, PR negative, and HER2 negative , but functionally Triple Negative,with a Ki67 of 40%. Patient with interpreter present. She underwent neoadjuvant chemotherapy from 02/12/2020 - 05/28/2020. 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 She underwent a left lumpectomy and sentinel node biopsy ( 4 negative nodes) on 07/15/2020. She underwent radiation and anti-estrogen therapy.  She had interval development of diffuse skin thickening in 2023. She had a punch biopsy of her skin at that time and this was consistent with inflammatory breast cancer. She has been treated with chemo since then. She had a left modified radical mastectomy on 08/22/2022 with complete therapeutic response and 0/1 LN. She noted some swelling in her left arm and on 08/25/2022 she had an [place] that was negative for DVT. Her SOZO screen on 09/19/2021 was 6.9 points above baseline putting her in the yellow zone and she was given a compression sleeve.  PAIN:  Are you having pain? No feeling better since bandaging has gotten arm smaller  PRECAUTIONS: Other: lymphedema [name]  WEIGHT BEARING RESTRICTIONS: No  FALLS:  Has patient fallen in last 6 months? No  LIVING ENVIRONMENT: Lives with:  daughters aged 67, 68 and 27 Lives in:  House/apartment Stairs: No;  Has following equipment at home: None  OCCUPATION: not currently working, since Feb 2021 stopped working due to number of appts, plans to return to work; previously worked Dentist at [employer] but will plan to return to some sort of [place] work  LEISURE: pt does not exercise  HAND DOMINANCE: right   PRIOR LEVEL OF FUNCTION: Independent  PATIENT GOALS: get the swelling down, improve L shoulder ROM, decrease pain   OBJECTIVE:  COGNITION: Overall cognitive status: Within functional limits for tasks assessed   PALPATION: Decreased scar mobility  OBSERVATIONS / OTHER ASSESSMENTS: scar well healed, increased edema at lateral trunk and throughout [name]  POSTURE: forward head, rounded shoulders  UPPER EXTREMITY AROM/PROM:  A/PROM RIGHT   eval   Shoulder extension 68  Shoulder flexion 160  Shoulder abduction 175  Shoulder internal rotation 56  Shoulder external rotation 88    (Blank rows = not tested)  A/PROM LEFT   eval  Shoulder extension 52  Shoulder flexion 146  Shoulder abduction 148  Shoulder internal rotation 67  Shoulder external rotation 86    (Blank rows = not tested)   LYMPHEDEMA ASSESSMENTS:  SURGERY TYPE/DATE: Left Lumpectomy with SLNB11/11/2019, Left modified mastectomy with left axillary node dissection NUMBER OF LYMPH NODES REMOVED: 0/4 with lumpectomy, 0/1 during Mastectomy CHEMOTHERAPY: yes RADIATION:Yes HORMONE TREATMENT: may require INFECTIONS: none  LYMPHEDEMA ASSESSMENTS:  LANDMARK RIGHT  eval  10 cm proximal to olecranon process 31.4  Olecranon process 27.2  10 cm proximal to ulnar styloid process 25.7  Just proximal to ulnar styloid process 18.4  Across hand at thumb web space 20  At base of 2nd digit 6.4  (Blank rows = not tested)  LANDMARK LEFT  eval LEFT 10/25/22 Left  11/03/22  10 cm proximal to olecranon process 33 33 31.9  Olecranon process 28.4 28.5 28.5  10 cm proximal to ulnar  styloid process 26 26.5 26.4  Just proximal to ulnar styloid process 19.4 19 18.9  Across hand at thumb web space 21 20.5 19.7   TODAY'S TREATMENT:                                                                                                                                         DATE:  11/03/22: Manual Therapy Remeasured circumference after doffing bandages assessing her technique while removing.  MLD: Short neck, superficial and deep abdominals, Lt inguinal and Rt axillary nodes, Lt axillo-inguinal and anterior inter-axillary anastomosis, then Lt UE working from proximal to distal then retracing all step reviewing correct light pressure and skin stretch only, no sliding on skin, while performing on pt.  Compression Bandaging: Cocoa butter, TG soft from hand to axilla,  mollelast to digits 1-4 with therapist performing first 2 digits and pt completing last 2 , artiflex from hand to axilla, 1 6 cm bandage at hand (reviewing technique while performing) with 1/2" gray foam to dorsal aspect that covered past wrist (pt was instructed she can try this but does not have to use, but please bring back), 1 8 cm bandage from wrist to axilla with x at antecubital fossa and 1 12 cm bandage from wrist to axilla reviewing  correct spacing and tension while performing.   11/01/22 Spent session instructing pt in how to apply compression bandaging independently at home as follows : TG soft from hand to axilla,  mollelast to digits 1-4 , artiflex from hand to axilla, 1 6 cm bandage at hand, 1 8 cm bandage from wrist to axilla with x at antecubital fossa and 1 12 cm bandage from wrist to axilla. Therapist provided v/c and t/c cues throughout and her interpreter typed out the instructions in Spanish. Educated pt to remove bandages and reapply prior to next appt so therapist can see how she applies them.   10/27/22: Self Care Pt returns having asked about possible infection when at radiation and they were not concerned  with this being a possibility. Also today she is not visibly red at lateral trunk. Pt was asking about how ling we thought the swelling would last so had an in depth conversation about how she has had 2 lymph node surgeries now and that she is at a very high risk for developing lymphedema. Also that we area seeming to be catching her while she is transitioning from subclinical lymphedema to lymphedema. Pt became tearful during session but was able to verbalize a good understanding and asked appropriate questions throughout. Also redid her SOZO and today her change from baseline was elevated back in the yellow. So decided that for now pt will cont to wear Medi Harmony prophylactic sleeve and gauntlet but that she will probably need to be measured for a flat knit sleeve and glove soon. Explained difference between circular knit and flat knit garments by showing her a flat knit so she could feel difference of garments in comparison to what she has been wearing. Also pt is on board for more instruction in self bandaging as she may need to be doing this as radiation continues which could potentially worsen her lymphedema.  Manual Therapy MLD as time allowed at end of session to Lt UE as follows: Short neck, superficial abdominals and 5 diaphragmatic breaths, Lt inguinal and Rt axillary nodes, Lt axillo-inguinal and anterior inter-axillary anastomosis, then Lt UE working from proximal to distal then retracing all steps. Reviewed technique with pt and she was able to return good demo of correct pressure but did need hand over hand cuing for correct skin stretch, not slide. Pt was then able to return good demo.  P/ROM to Lt shoulder briefly into flexion and abd with scapular depression by therapist throughout. She reports pect tendon tightness and her scapula is very elevated which is probably contributing to arm pain she mentions has been intermittent for weeks now.  STM along tight Lt pect tendon when in end range  shoulder stretches    PATIENT EDUCATION:  Education details: lymphedema, anatomy and physiology of the lymphatic system, how lymphedema is treated, subclinical lymphedema vs lymphedema, need for compression bra Person educated: Patient Education method: Explanation Education comprehension: verbalized understanding  HOME EXERCISE PROGRAM: Obtain compression bra, continue to wear sleeve and glove  ASSESSMENT:  CLINICAL IMPRESSION: Assessed pts bandaging technique and overall she did very well. Her tension was a bit loose and spacing on fingers was not even or applied with compression gradient in mind (distal to proximal) so reviewed importance of this verbally and with demonstration. But pt did very well with technique of all hand and arm bandages. Her hand and upper arm circumference has reduced since eval and her hand was visibly reduced today. Added gray compression foam here to help further  reduce dorsal hand swelling. Educated pt that she can try to use foam but does not have to, just please bring back either way and she verbalized understanding. Therapist and pt did finger bandages (see above) and therapist applied rest of arm bandage since pt did great with sequence of bandages, just reaffirmed tension with her while applying and correct spacing (50% overlap). Interpreter present for all of session.    OBJECTIVE IMPAIRMENTS: decreased knowledge of condition, decreased ROM, decreased strength, increased edema, increased fascial restrictions, impaired UE functional use, postural dysfunction, and pain.   ACTIVITY LIMITATIONS: carrying, lifting, and reach over head  PARTICIPATION LIMITATIONS: cleaning, laundry, community activity, occupation, and yard work  PERSONAL FACTORS: Time since onset of injury/illness/exacerbation are also affecting patient's functional outcome.   REHAB POTENTIAL: Good  CLINICAL DECISION MAKING: Stable/uncomplicated  EVALUATION COMPLEXITY: Low  GOALS: Goals  reviewed with patient? Yes   SHORT TERM GOALS = LONG TERM GOALS: Target date: 11/09/22  Pt will be independent in self MLD for long term management of lymphedema.  Baseline:  Goal status: INITIAL  2.  Pt will obtain appropriate compression garments for long term management of lymphedema.  Baseline:  Goal status: INITIAL  3.  Pt will demonstrate 160 degrees of L shoulder flexion to allow her to reach overhead. Baseline:  Goal status: INITIAL  4. Pt will demonstrate 170 degrees of L shoulder abduction to allow her to reach out to the side.   Goal: INITIAL  5. Pt will be independent in a home exercise program for continued stretching and strengthening.  Goal: INITIAL  6. Pt will report she is able to raise her L arm to end range wihtout increased pain in her axilla to allow improved comfort.   GOAL: INITIAL   PLAN:  PT FREQUENCY: 2x/week  PT DURATION: 4 weeks  PLANNED INTERVENTIONS: Therapeutic exercises, Therapeutic activity, Patient/Family education, Self Care, Joint mobilization, Manual lymph drainage, Compression bandaging, scar mobilization, Taping, Vasopneumatic device, and Manual therapy  PLAN FOR NEXT SESSION: Cont to instruct in UE bandaging; measure circumference of Lt and if close to her Rt then consider measuring her for a flat knit sleeve and glove, and nighttime as well if she fits into OTS for day using [employer]; cont MLD to L UE and lateral trunk and instructing pt, PROM for L UE and STM to pect tendon, when independent with bandaging and self MLD add ball and pulleys to instruct in scapular depression with active shoulder A/ROM and add supine scapular series   Otelia Limes, PTA 11/03/2022, 12:25 PM

## 2022-11-04 ENCOUNTER — Other Ambulatory Visit: Payer: Self-pay

## 2022-11-04 ENCOUNTER — Encounter: Payer: Self-pay | Admitting: Nurse Practitioner

## 2022-11-04 ENCOUNTER — Ambulatory Visit (INDEPENDENT_AMBULATORY_CARE_PROVIDER_SITE_OTHER): Payer: No Typology Code available for payment source | Admitting: Nurse Practitioner

## 2022-11-04 ENCOUNTER — Ambulatory Visit
Admission: RE | Admit: 2022-11-04 | Discharge: 2022-11-04 | Disposition: A | Discharge status: Home / Self Care | Payer: No Typology Code available for payment source | Source: Ambulatory Visit | Attending: Radiation Oncology

## 2022-11-04 VITALS — BP 106/61 | HR 81 | Temp 97.2°F | Wt 191.0 lb

## 2022-11-04 DIAGNOSIS — Z1329 Encounter for screening for other suspected endocrine disorder: Secondary | ICD-10-CM

## 2022-11-04 DIAGNOSIS — G8929 Other chronic pain: Secondary | ICD-10-CM

## 2022-11-04 DIAGNOSIS — E559 Vitamin D deficiency, unspecified: Secondary | ICD-10-CM

## 2022-11-04 DIAGNOSIS — M545 Low back pain, unspecified: Secondary | ICD-10-CM

## 2022-11-04 LAB — RAD ONC ARIA SESSION SUMMARY
Course Elapsed Days: 16
Plan Fractions Treated to Date: 7
Plan Prescribed Dose Per Fraction: 1.8 Gy
Plan Total Fractions Prescribed: 14
Plan Total Prescribed Dose: 25.2 Gy
Reference Point Dosage Given to Date: 12.6 Gy
Reference Point Session Dosage Given: 1.8 Gy
Session Number: 13

## 2022-11-04 MED ORDER — MELOXICAM 7.5 MG PO TABS: 7.5000 mg | ORAL_TABLET | Freq: Every day | ORAL | 0 refills | Status: DC

## 2022-11-04 MED ORDER — VITAMIN D (ERGOCALCIFEROL) 1.25 MG (50000 UNIT) PO CAPS: 50000.0000 [IU] | ORAL_CAPSULE | ORAL | 2 refills | Status: DC

## 2022-11-04 NOTE — Assessment & Plan Note (Signed)
-   Vitamin D, 25-hydroxy  2. Thyroid disorder screening  - Thyroid Panel With TSH  3. Chronic bilateral low back pain without sciatica  - meloxicam (MOBIC) 7.5 MG tablet; Take 1 tablet (7.5 mg total) by mouth daily.  Dispense: 30 tablet; Refill: 0  Follow up:  Follow up in 3 months

## 2022-11-04 NOTE — Patient Instructions (Addendum)
1. Vitamin D deficiency  - Vitamin D, 25-hydroxy  2. Thyroid disorder screening  - Thyroid Panel With TSH  3. Chronic bilateral low back pain without sciatica  - meloxicam (MOBIC) 7.5 MG tablet; Take 1 tablet (7.5 mg total) by mouth daily.  Dispense: 30 tablet; Refill: 0  Follow up:  Follow up in 3 months  Consejos generales Si necesita adelgazar, consulte a su mdico. Evite lo siguiente: Alimentos con Holiday representative. Comidas fritas. Alimentos con grasas trans o aceites parcialmente hidrogenados. Estos incluyen algunas margarinas y productos horneados. Si bebe alcohol: Limite la cantidad que bebe a lo siguiente: De 0 a 1 medida por da para las mujeres que no estn embarazadas. De 0 a 2 medidas por da para los hombres. Sepa cunta cantidad de alcohol hay en las bebidas. En los [place], una medida equivale a una botella de cerveza de 12 oz (355 ml), un vaso de vino de 5 oz (148 ml) o un vaso de una bebida alcohlica de alta graduacin de 1 oz (44 ml). Leer las etiquetas de los alimentos Lea las etiquetas de los alimentos para Civil engineer, contracting lo siguiente: Si contienen grasas trans. Si contienen aceites parcialmente hidrogenados. La cantidad de grasas saturadas (g) que contiene cada porcin. La cantidad de colesterol (mg) que contiene cada porcin. La cantidad de fibra (g) que contiene cada porcin. Elija alimentos con grasas saludables, tales como las siguientes: Grasas monoinsaturadas y grasas poliinsaturadas. Estas incluyen aceite de oliva y de canola, semillas de lino, nueces, almendras y semillas. Grasas omega 3. Estas se encuentran en ciertos pescados, el aceite de linaza y las semillas de lino molidas. Elija productos de cereal que tengan cereales integrales. Busque la palabra "integral" en Equities trader de la lista de ingredientes. Al cocinar Emplee mtodos de coccin con poca cantidad de grasa. Por ejemplo, hornear, hervir, grillar y Holiday representative. Coma ms comidas caseras.  Coma en restaurantes y bares con menos frecuencia. Consuma menos comida rpida. Evite cocinar usando grasas saturadas, como [place], crema, aceite de [place], aceite de palmiste y aceite de Building services engineer. Planificacin de las comidas  En las comidas, [place] su plato en cuatro partes iguales: Llene la mitad del plato con verduras, ensaladas de hojas verdes y frutas. Llene un cuarto del plato con cereales integrales. Llene un cuarto del plato con alimentos con protenas con bajo contenido de grasas [employer]). Coma pescado con alto contenido de grasas omega 3 al [employer] veces por semana. Esas grasas se encuentran en la caballa, el atn, las sardinas y el salmn. Coma alimentos con alto contenido de [place], como cereales integrales, frijoles, [place], peras, frutos rojos, brcoli, zanahorias, guisantes y [place]. Qu alimentos debo comer? Lambert Mody Lambert Mody frescas, en conserva (en su jugo natural) o frutas congeladas. Verduras Verduras frescas o congeladas (crudas, al vapor, asadas o grilladas). Ensaladas de hojas verdes. Granos Cereales integrales, como panes, galletas, cereales y pastas de integrales o de trigo integral. [place]ven[place] sin endulzar, trigo bulgur, cebada, quinua o arroz integral. Tortillas de harina de maz o trigo integral. Carnes y otros alimentos ricos en protenas Carne de res molida (al 85 % o ms [place]), carne de res de animales alimentados con pastos o carne de res sin la grasa. Pollo o pavo sin piel. Carne de pollo o de [place]. Cerdo sin la grasa. Todos los pescados y frutos de mar. Claras de huevo. Porotos, guisantes o lentejas secos. Frutos secos o semillas sin sal. Frijoles enlatados sin sal. Mantequillas de frutos secos sin azcar ni aceite agregados.  Lcteos Productos lcteos descremados o semidescremados, como [employer] o al 1 %, quesos reducidos en grasas o al 2 %, queso cottage o ricota con bajo contenido de [place] o sin contenido de [place], o yogur natural descremado o  semidescremado. Grasas y aceites Margarina untable que no contenga grasas trans. [place] y condimentos para ensaladas livianos o reducidos en grasas. [place]. Aceites de oliva, canola, ssamo o crtamo. Es posible que los productos que se enumeran ms [place] no constituyan una lista completa de los alimentos y las bebidas que puede tomar. Consulte a un nutricionista para obtener ms informacin. Qu alimentos debo evitar? Lambert Mody Fruta enlatada en almbar espeso. Frutas con salsa de crema o mantequilla. Frutas cocidas en aceite. Verduras Verduras cocinadas con salsas de queso, crema o mantequilla. Verduras fritas. Granos Pan blanco. Pastas blancas. Arroz blanco. Pan de maz. Bagels, pasteles y croissants. Galletas saladas y colaciones que contengan grasas trans y aceites hidrogenados. Carnes y otros alimentos ricos en protenas [place] de carne con alto contenido de [place]. Costillas, alas de pollo, tocineta, salchicha, mortadela, salame, chinchulines, tocino, perros calientes, salchichas alemanas y embutidos envasados. Hgado y otros rganos. Huevos enteros y yemas de [place]. Pollo y pavo con piel. Carne frita. Lcteos [place]he entera o al 2 %, crema, mezcla de [place] y crema, y queso crema. Quesos enteros. Yogur entero o endulzado. Quesos con toda su grasa. [place] no lcteas y coberturas batidas. Quesos procesados, quesos para untar y [place]. Grasas y aceites [place], [place] en barra, [place] de [place], [place], [place] clarificada o grasa de tocino. Aceites de coco, de palmiste y de palma. Refrescos Alcohol. Bebidas endulzadas con azcar, como refrescos, limonada y bebidas frutales. Dulces y postres Jarabe de maz, azcares, miel y Control and instrumentation engineer. Caramelos. Mermeladas y [place]. Chrissie Noa. Cereales endulzados. Galletas, pasteles, bizcochuelos, donas, muffins y helado. Es posible que los productos que se enumeran ms [place] no constituyan una lista completa de los alimentos y las bebidas que Materials engineer. Consulte a un nutricionista para obtener ms informacin. Resumen Elegir los alimentos adecuados ayuda a Theatre manager normales los niveles de grasas y [place]. Esto puede evitarle contraer ciertas enfermedades. En las comidas, llene la mitad del plato con verduras, ensaladas de hojas verdes y frutas. Coma alimentos con alto contenido de fibras, como cereales integrales, frijoles, [place], peras, frutos rojos, zanahorias, guisantes y [place]. Limite los alimentos con azcar agregada y grasas saturadas, el alcohol y las comidas fritas. Esta informacin no tiene Marine scientist el consejo del mdico. Asegrese de hacerle al mdico cualquier pregunta que tenga. Document Revised: 01/28/2021 Document Reviewed: 01/28/2021 Elsevier Patient Education  [place].

## 2022-11-04 NOTE — Progress Notes (Signed)
$'@Patient'Q$  ID: [name], female    DOB: 01/02/1976, 47 y.o.   MRN: [id number]  Chief Complaint  Patient presents with   thryroid    Follow up     Referring provider: [name], NP   HPI   Patient presents today to follow-up on abnormal thyroid labs.  We will recheck blood work today.  Also needs a refill on Mobic.  We will also recheck vitamin D level.  Patient states that she is not currently having any symptoms related to thyroid or vitamin D deficiency. Denies f/c/s, n/v/d, hemoptysis, PND, leg swelling Denies chest pain or edema      Allergies  Allergen Reactions   Omnipaque [Iohexol] Other (See Comments)    Throat itching, some SOB and some chest pain post administration    Doxorubicin Hcl Rash    Experience chest tightness, abdominal pain, and redness around lips during and shortly after doxorubicin bolus.    Keytruda [Pembrolizumab] Other (See Comments)    Lung irritation; skin breakout; swelling   Other Itching    Almonds, cherries, peaches   Tape     Redness     Immunization History  Administered Date(s) Administered   Influenza,inj,Quad PF,6+ Mos 11/01/2016   Janssen (J&J) SARS-COV-2 Vaccination 01/31/2020   PFIZER(Purple Top)SARS-COV-2 Vaccination 08/19/2020   Unspecified SARS-COV-2 Vaccination 11/25/2020    Past Medical History:  Diagnosis Date   Allergy    Asthma    Breast cancer in female [facility])    Left   Breast disorder    breast cancer May 2021   Chronic back pain    Headache    High triglycerides    History of breast cancer    History of COVID-19 04/20/2021   Hx of migraines    Morbid obesity ([place])    Personal history of chemotherapy    Personal history of radiation therapy     Tobacco History: Social History   Tobacco Use  Smoking Status Never  Smokeless Tobacco Never   Counseling given: Not Answered   Outpatient Encounter Medications as of 11/04/2022  Medication Sig   simvastatin (ZOCOR) 20 MG tablet Take  1 tablet (20 mg total) by mouth every evening.   [DISCONTINUED] Vitamin D, Ergocalciferol, (DRISDOL) 1.25 MG (50000 UNIT) CAPS capsule Take 1 capsule (50,000 Units total) by mouth every 7 (seven) days.   meloxicam (MOBIC) 7.5 MG tablet Take 1 tablet (7.5 mg total) by mouth daily.   Vitamin D, Ergocalciferol, (DRISDOL) 1.25 MG (50000 UNIT) CAPS capsule Take 1 capsule (50,000 Units total) by mouth every 7 (seven) days.   [DISCONTINUED] meloxicam (MOBIC) 7.5 MG tablet Take 1 tablet (7.5 mg total) by mouth daily. (Patient not taking: Reported on 11/04/2022)   No facility-administered encounter medications on file as of 11/04/2022.     Review of Systems  Review of Systems  Constitutional: Negative.   HENT: Negative.    Cardiovascular: Negative.   Gastrointestinal: Negative.   Allergic/Immunologic: Negative.   Neurological: Negative.   Psychiatric/Behavioral: Negative.         Physical Exam  BP 106/61   Pulse 81   Temp (!) 97.2 F (36.2 C)   Wt 191 lb (86.6 kg)   LMP 01/11/2019 (Exact Date)   SpO2 100%   BMI 36.09 kg/m   Wt Readings from Last 5 Encounters:  11/04/22 191 lb (86.6 kg)  10/28/22 191 lb 11.2 oz (87 kg)  10/27/22 190 lb 2 oz (86.2 kg)  10/11/22 191 lb 2  oz (86.7 kg)  10/11/22 191 lb 3.2 oz (86.7 kg)     Physical Exam Vitals and nursing note reviewed.  Constitutional:      General: She is not in acute distress.    Appearance: She is well-developed.  Cardiovascular:     Rate and Rhythm: Normal rate and regular rhythm.  Pulmonary:     Effort: Pulmonary effort is normal.     Breath sounds: Normal breath sounds.  Neurological:     Mental Status: She is alert and oriented to person, place, and time.      Lab Results:  CBC    Component Value Date/Time   WBC 4.7 08/18/2022 1131   RBC 4.34 08/18/2022 1131   HGB 12.5 08/18/2022 1131   HGB 11.6 (L) 07/20/2022 0856   HGB 13.3 10/01/2021 1004   HCT 38.7 08/18/2022 1131   HCT 40.4 10/01/2021 1004   PLT  247 08/18/2022 1131   PLT 220 07/20/2022 0856   PLT 212 10/01/2021 1004   MCV 89.2 08/18/2022 1131   MCV 87 10/01/2021 1004   MCH 28.8 08/18/2022 1131   MCHC 32.3 08/18/2022 1131   RDW 15.8 (H) 08/18/2022 1131   RDW 13.3 10/01/2021 1004   LYMPHSABS 1.2 07/20/2022 0856   LYMPHSABS 1.6 10/01/2021 1004   MONOABS 0.3 07/20/2022 0856   EOSABS 0.1 07/20/2022 0856   EOSABS 0.1 10/01/2021 1004   BASOSABS 0.0 07/20/2022 0856   BASOSABS 0.0 10/01/2021 1004    BMET    Component Value Date/Time   NA 138 09/30/2022 1149   K 4.2 09/30/2022 1149   CL 101 09/30/2022 1149   CO2 23 09/30/2022 1149   GLUCOSE 100 (H) 09/30/2022 1149   GLUCOSE 126 (H) 07/20/2022 0856   BUN 11 09/30/2022 1149   CREATININE 0.78 09/30/2022 1149   CREATININE 0.80 07/20/2022 0856   CALCIUM 10.0 09/30/2022 1149   GFRNONAA >60 07/20/2022 0856   GFRAA >60 06/03/2020 0949   GFRAA >60 02/26/2020 0915    BNP    Component Value Date/Time   BNP 15.2 09/25/2020 1624    ProBNP No results found for: "PROBNP"  Imaging: No results found.   Assessment & Plan:   Vitamin D deficiency - Vitamin D, 25-hydroxy  2. Thyroid disorder screening  - Thyroid Panel With TSH  3. Chronic bilateral low back pain without sciatica  - meloxicam (MOBIC) 7.5 MG tablet; Take 1 tablet (7.5 mg total) by mouth daily.  Dispense: 30 tablet; Refill: 0  Follow up:  Follow up in 3 months     [name], NP 11/04/2022

## 2022-11-05 ENCOUNTER — Other Ambulatory Visit: Payer: Self-pay

## 2022-11-05 LAB — THYROID PANEL WITH TSH
Free Thyroxine Index: 1.7 (ref 1.2–4.9)
T3 Uptake Ratio: 25 % (ref 24–39)
T4, Total: 6.6 ug/dL (ref 4.5–12.0)
TSH: 4.5 u[IU]/mL (ref 0.450–4.500)

## 2022-11-05 LAB — VITAMIN D 25 HYDROXY (VIT D DEFICIENCY, FRACTURES): Vit D, 25-Hydroxy: 32.5 ng/mL (ref 30.0–100.0)

## 2022-11-07 ENCOUNTER — Ambulatory Visit
Admission: RE | Admit: 2022-11-07 | Discharge: 2022-11-07 | Disposition: A | Discharge status: Home / Self Care | Payer: No Typology Code available for payment source | Source: Ambulatory Visit | Attending: Radiation Oncology | Admitting: Radiation Oncology

## 2022-11-07 ENCOUNTER — Ambulatory Visit
Admission: RE | Admit: 2022-11-07 | Discharge: 2022-11-07 | Disposition: A | Discharge status: Home / Self Care | Payer: No Typology Code available for payment source | Source: Ambulatory Visit | Attending: Radiation Oncology

## 2022-11-07 ENCOUNTER — Other Ambulatory Visit: Payer: Self-pay

## 2022-11-07 DIAGNOSIS — Z170 Estrogen receptor positive status [ER+]: Secondary | ICD-10-CM

## 2022-11-07 LAB — RAD ONC ARIA SESSION SUMMARY
Course Elapsed Days: 19
Plan Fractions Treated to Date: 7
Plan Prescribed Dose Per Fraction: 1.8 Gy
Plan Total Fractions Prescribed: 14
Plan Total Prescribed Dose: 25.2 Gy
Reference Point Dosage Given to Date: 12.6 Gy
Reference Point Session Dosage Given: 1.8 Gy
Session Number: 14

## 2022-11-07 MED ORDER — RADIAPLEXRX EX GEL: Freq: Once | CUTANEOUS | Status: AC

## 2022-11-08 ENCOUNTER — Ambulatory Visit: Payer: No Typology Code available for payment source | Attending: General Surgery | Admitting: Physical Therapy

## 2022-11-08 ENCOUNTER — Other Ambulatory Visit: Payer: Self-pay

## 2022-11-08 ENCOUNTER — Encounter: Payer: Self-pay | Admitting: Physical Therapy

## 2022-11-08 ENCOUNTER — Ambulatory Visit
Admission: RE | Admit: 2022-11-08 | Discharge: 2022-11-08 | Disposition: A | Discharge status: Home / Self Care | Payer: No Typology Code available for payment source | Source: Ambulatory Visit | Attending: Radiation Oncology | Admitting: Radiation Oncology

## 2022-11-08 DIAGNOSIS — C50412 Malignant neoplasm of upper-outer quadrant of left female breast: Secondary | ICD-10-CM | POA: Insufficient documentation

## 2022-11-08 DIAGNOSIS — Z170 Estrogen receptor positive status [ER+]: Secondary | ICD-10-CM | POA: Insufficient documentation

## 2022-11-08 DIAGNOSIS — M25612 Stiffness of left shoulder, not elsewhere classified: Secondary | ICD-10-CM | POA: Insufficient documentation

## 2022-11-08 DIAGNOSIS — I972 Postmastectomy lymphedema syndrome: Secondary | ICD-10-CM | POA: Insufficient documentation

## 2022-11-08 DIAGNOSIS — R293 Abnormal posture: Secondary | ICD-10-CM

## 2022-11-08 DIAGNOSIS — Z483 Aftercare following surgery for neoplasm: Secondary | ICD-10-CM

## 2022-11-08 LAB — RAD ONC ARIA SESSION SUMMARY
Course Elapsed Days: 20
Plan Fractions Treated to Date: 8
Plan Prescribed Dose Per Fraction: 1.8 Gy
Plan Total Fractions Prescribed: 14
Plan Total Prescribed Dose: 25.2 Gy
Reference Point Dosage Given to Date: 14.4 Gy
Reference Point Session Dosage Given: 1.8 Gy
Session Number: 15

## 2022-11-08 NOTE — Therapy (Signed)
OUTPATIENT PHYSICAL THERAPY ONCOLOGY TREATMENT  Patient Name: [name] MRN: [id number] DOB:Sep 11, 1976, 47 y.o., female Today's Date: 11/08/2022  END OF SESSION:  PT End of Session - 11/08/22 1321     Visit Number 9    Number of Visits 9    Date for PT Re-Evaluation 11/09/22    Authorization Type CAFA expires 7/28    PT Start Time 1107    PT Stop Time 1203    PT Time Calculation (min) 56 min    Activity Tolerance Patient tolerated treatment well    Behavior During Therapy [facility] for tasks assessed/performed                 Past Medical History:  Diagnosis Date   Allergy    Asthma    Breast cancer in female [facility])    Left   Breast disorder    breast cancer May 2021   Chronic back pain    Headache    High triglycerides    History of breast cancer    History of COVID-19 04/20/2021   Hx of migraines    Morbid obesity ([place])    Personal history of chemotherapy    Personal history of radiation therapy    Past Surgical History:  Procedure Laterality Date   BREAST LUMPECTOMY     BREAST LUMPECTOMY WITH RADIOACTIVE SEED AND SENTINEL LYMPH NODE BIOPSY Left 07/15/2020   Procedure: LEFT BREAST LUMPECTOMY WITH RADIOACTIVE SEED AND LEFT AXILLARY SENTINEL LYMPH NODE BIOPSY, LEFT AXILLARY NODE RADIOACTIVE SEED GUIDED EXCISION, LEFT BREAST RADIOACTIVE SEED GUIDED EXCISION IM NODE;  Surgeon: Rolm Bookbinder, MD;  Location: [place];  Service: General;  Laterality: Left;  PEC BLOCK   CESAREAN SECTION WITH BILATERAL TUBAL LIGATION Bilateral 07/23/2015   Procedure: CESAREAN SECTION WITH BILATERAL TUBAL LIGATION;  Surgeon: Donnamae Jude, MD;  Location: [facility];  Service: Obstetrics;  Laterality: Bilateral;   IR IMAGING GUIDED PORT INSERTION  02/11/2022   MODIFIED MASTECTOMY Left 08/22/2022   Procedure: LEFT MODIFIED RADICAL MASTECTOMY, LEFT AXILLARY NODE DISSECTION, AND SKIN BIOPSY OF BACK;  Surgeon: Rolm Bookbinder, MD;  Location: [facility];  Service: General;  Laterality: Left;    PORT-A-CATH REMOVAL N/A 04/30/2021   Procedure: REMOVAL PORT-A-CATH;  Surgeon: Rolm Bookbinder, MD;  Location: [place];  Service: General;  Laterality: N/A;   PORTACATH PLACEMENT Right 02/11/2020   Procedure: INSERTION PORT-A-CATH WITH ULTRASOUND GUIDANCE;  Surgeon: Rolm Bookbinder, MD;  Location: [place];  Service: General;  Laterality: Right;   TUBAL LIGATION     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[place]) 04/01/2022   Encounter for dental examination 03/11/2022   Acute apical periodontitis 03/11/2022   Phobia of dental procedure 03/11/2022   Defective dental restoration 03/11/2022   Accretions on teeth 03/11/2022   Chronic periodontitis 03/11/2022   Teeth missing 03/11/2022   Caries 03/11/2022   Generalized gingival recession 03/11/2022   Pain, dental 03/08/2022   Cancer-related pain 03/08/2022   Port-A-Cath in place 02/15/2022   Oral allergy syndrome, subsequent encounter 02/03/2022   Adverse effect of other drugs, medicaments and biological substances, subsequent encounter 02/02/2022   Other adverse food reactions, not elsewhere classified, subsequent encounter 02/02/2022   Chest tightness 02/02/2022   Other allergic  rhinitis 02/02/2022   Allergic conjunctivitis of both eyes 02/02/2022   Language barrier 12/11/2020   Morbid obesity ([place])    History of breast cancer    Genetic testing 02/13/2020   Malignant neoplasm of upper-outer quadrant of left breast in female, estrogen receptor positive ([place]) 01/27/2020   Impaired ability to use community resources due to language barrier 07/13/2013    PCP: Lazaro Arms, NP  REFERRING PROVIDER: Benay Pike, MD  REFERRING DIAG: Left UE  lymphedema  THERAPY DIAG:  Postmastectomy lymphedema  Stiffness of left shoulder, not elsewhere classified  Aftercare following surgery for neoplasm  Abnormal posture  Malignant neoplasm of upper-outer quadrant of left breast in female, estrogen receptor positive ([place])  ONSET DATE: 08/01/22  Rationale for Evaluation and Treatment: Rehabilitation  SUBJECTIVE:                                                                                                                                                                                           SUBJECTIVE STATEMENT: When I take the bandage off the arm is not swollen.   PERTINENT HISTORY:  Patient was diagnosed on 01/23/2020 with left grade III invasive ductal carcinoma breast cancer. It is ER positive, PR negative, and HER2 negative , but functionally Triple Negative,with a Ki67 of 40%. Patient with interpreter present. She underwent neoadjuvant chemotherapy from 02/12/2020 - 05/28/2020. 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 She underwent a left lumpectomy and sentinel node biopsy ( 4 negative nodes) on 07/15/2020. She underwent radiation and anti-estrogen therapy.  She had interval development of diffuse skin thickening in 2023. She had a punch biopsy of her skin at that time and this was consistent with inflammatory breast cancer. She has been treated with chemo since then. She had a left modified radical mastectomy on 08/22/2022 with complete therapeutic response and 0/1 LN. She noted some swelling in her left arm and on 08/25/2022 she had an [place] that was negative for DVT. Her SOZO screen on 09/19/2021 was 6.9 points above baseline putting her in the yellow zone and she was given a compression sleeve.  PAIN:  Are you having pain? No feeling better since bandaging has gotten arm smaller  PRECAUTIONS: Other: lymphedema [name]  WEIGHT BEARING RESTRICTIONS: No  FALLS:  Has patient fallen in last 6 months? 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  LEISURE: pt does not exercise  HAND DOMINANCE: right   PRIOR LEVEL OF FUNCTION: Independent  PATIENT GOALS: get the swelling down, improve L shoulder ROM, decrease pain   OBJECTIVE:  COGNITION: Overall cognitive status: Within functional limits for tasks assessed   PALPATION: Decreased scar mobility  OBSERVATIONS / OTHER ASSESSMENTS: scar well healed, increased edema at lateral trunk and throughout [name]  POSTURE: forward head, rounded shoulders  UPPER EXTREMITY AROM/PROM:  A/PROM RIGHT   eval   Shoulder extension 68  Shoulder flexion 160  Shoulder abduction 175  Shoulder internal rotation 56  Shoulder external rotation 88    (Blank rows = not tested)  A/PROM LEFT   eval  Shoulder extension 52  Shoulder flexion 146  Shoulder abduction 148  Shoulder internal rotation 67  Shoulder external rotation 86    (Blank rows = not tested)   LYMPHEDEMA ASSESSMENTS:  SURGERY TYPE/DATE: Left Lumpectomy with SLNB11/11/2019, Left modified mastectomy with left axillary node dissection NUMBER OF LYMPH NODES REMOVED: 0/4 with lumpectomy, 0/1 during Mastectomy CHEMOTHERAPY: yes RADIATION:Yes HORMONE TREATMENT: may require INFECTIONS: none  LYMPHEDEMA ASSESSMENTS:  LANDMARK RIGHT  eval RIGHT 11/08/22  10 cm proximal to olecranon process 31.4 34.4  Olecranon process 27.'2 28  10 '$ cm proximal to ulnar styloid process 25.7 25.5  Just proximal to ulnar styloid process 18.4 19  Across hand at thumb web space 20 20  At base of 2nd digit 6.4 6.5  (Blank rows = not tested)  LANDMARK LEFT  eval LEFT 10/25/22 Left  11/03/22 LEFT  11/08/22  10 cm proximal to olecranon process 33 33 31.9 33  Olecranon process 28.4 28.5 28.'5 29  10 '$ cm proximal to ulnar styloid process 26 26.5 26.4 26.6  Just proximal to ulnar styloid process 19.4 19 18.9 19.1  Across hand at thumb web space 21 20.5 19.7 20.7  2nd digit    6.7   TODAY'S TREATMENT:                                                                                                                                         DATE:  11/08/22: Measured pt for flat knit compression sleeve and glove and pt did not fit in to an off the shelf night time garment or day time glove so signed pt up to be measured by Primitivo Gauze on 11/23/22. Pt did not fit in to an off the shelf circaid profile night time garment, a medi mondi off the shelf or a tribute wrap night time.  Spent majority of session educated pt about lymphedema and answering pt's questions about lymphedema - see assessment 11/03/22: Manual Therapy Remeasured circumference after doffing bandages assessing her technique while removing.  MLD: Short neck, superficial and deep abdominals, Lt inguinal and Rt axillary nodes, Lt axillo-inguinal and anterior inter-axillary anastomosis, then Lt UE working  from proximal to distal then retracing all step reviewing correct light pressure and skin stretch only, no sliding on skin, while performing on pt.  Compression Bandaging: Cocoa butter, TG soft from hand to axilla,  mollelast to digits 1-4 with therapist performing first 2 digits and pt completing last 2 , artiflex from hand to axilla, 1 6 cm bandage at hand (reviewing technique while performing) with 1/2" gray foam to dorsal aspect that covered past wrist (pt was instructed she can try this but does not have to use, but please bring back), 1 8 cm bandage from wrist to axilla with x at antecubital fossa and 1 12 cm bandage from wrist to axilla reviewing correct spacing and tension while performing.   11/01/22 Spent session instructing pt in how to  apply compression bandaging independently at home as follows : TG soft from hand to axilla,  mollelast to digits 1-4 , artiflex from hand to axilla, 1 6 cm bandage at hand, 1 8 cm bandage from wrist to axilla with x at antecubital fossa and 1 12 cm bandage from wrist to axilla. Therapist provided v/c and t/c cues throughout and her interpreter typed out the instructions in Spanish. Educated pt to remove bandages and reapply prior to next appt so therapist can see how she applies them.   10/27/22: Self Care Pt returns having asked about possible infection when at radiation and they were not concerned with this being a possibility. Also today she is not visibly red at lateral trunk. Pt was asking about how ling we thought the swelling would last so had an in depth conversation about how she has had 2 lymph node surgeries now and that she is at a very high risk for developing lymphedema. Also that we area seeming to be catching her while she is transitioning from subclinical lymphedema to lymphedema. Pt became tearful during session but was able to verbalize a good understanding and asked appropriate questions throughout. Also redid her SOZO and today her change from baseline was elevated back in the yellow. So decided that for now pt will cont to wear Medi Harmony prophylactic sleeve and gauntlet but that she will probably need to be measured for a flat knit sleeve and glove soon. Explained difference between circular knit and flat knit garments by showing her a flat knit so she could feel difference of garments in comparison to what she has been wearing. Also pt is on board for more instruction in self bandaging as she may need to be doing this as radiation continues which could potentially worsen her lymphedema.  Manual Therapy MLD as time allowed at end of session to Lt UE as follows: Short neck, superficial abdominals and 5 diaphragmatic breaths, Lt inguinal and Rt axillary nodes, Lt axillo-inguinal and  anterior inter-axillary anastomosis, then Lt UE working from proximal to distal then retracing all steps. Reviewed technique with pt and she was able to return good demo of correct pressure but did need hand over hand cuing for correct skin stretch, not slide. Pt was then able to return good demo.  P/ROM to Lt shoulder briefly into flexion and abd with scapular depression by therapist throughout. She reports pect tendon tightness and her scapula is very elevated which is probably contributing to arm pain she mentions has been intermittent for weeks now.  STM along tight Lt pect tendon when in end range shoulder stretches    PATIENT EDUCATION:  Education details: lymphedema, anatomy and physiology of the lymphatic system, how lymphedema  is treated, subclinical lymphedema vs lymphedema, need for compression bra Person educated: Patient Education method: Explanation Education comprehension: verbalized understanding  HOME EXERCISE PROGRAM: Obtain compression bra, continue to wear sleeve and glove  ASSESSMENT:  CLINICAL IMPRESSION: Pt reports when she wears her bandages her swelling goes down but when she removes them and wears her sleeve the swelling returns. Educated pt about flat knit vs circular knit compression sleeve and glove and how there is more containment in a flat knit and why this would be a better option. Pt had questions about what lymphedema is and whether it can be cured. Educated pt in detail about anatomy and physiology of the lymphatic system and the reason that lymphedema happens and why it is not curable. Educated pt about availability of day time and night time garments and how these compare to bandages and how they all work to control edema. By end of session pt reports she understands lymphedema and why she needs garments. She will be measured for a custom daytime sleeve/glove and night time garment since her upper arm is out of the range for an off the shelf. She is to be  measured of 11/23/22.   OBJECTIVE IMPAIRMENTS: decreased knowledge of condition, decreased ROM, decreased strength, increased edema, increased fascial restrictions, impaired UE functional use, postural dysfunction, and pain.   ACTIVITY LIMITATIONS: carrying, lifting, and reach over head  PARTICIPATION LIMITATIONS: cleaning, laundry, community activity, occupation, and yard work  PERSONAL FACTORS: Time since onset of injury/illness/exacerbation are also affecting patient's functional outcome.   REHAB POTENTIAL: Good  CLINICAL DECISION MAKING: Stable/uncomplicated  EVALUATION COMPLEXITY: Low  GOALS: Goals reviewed with patient? Yes   SHORT TERM GOALS = LONG TERM GOALS: Target date: 11/09/22  Pt will be independent in self MLD for long term management of lymphedema.  Baseline:  Goal status: INITIAL  2.  Pt will obtain appropriate compression garments for long term management of lymphedema.  Baseline:  Goal status: INITIAL  3.  Pt will demonstrate 160 degrees of L shoulder flexion to allow her to reach overhead. Baseline:  Goal status: INITIAL  4. Pt will demonstrate 170 degrees of L shoulder abduction to allow her to reach out to the side.   Goal: INITIAL  5. Pt will be independent in a home exercise program for continued stretching and strengthening.  Goal: INITIAL  6. Pt will report she is able to raise her L arm to end range wihtout increased pain in her axilla to allow improved comfort.   GOAL: INITIAL   PLAN:  PT FREQUENCY: 2x/week  PT DURATION: 4 weeks  PLANNED INTERVENTIONS: Therapeutic exercises, Therapeutic activity, Patient/Family education, Self Care, Joint mobilization, Manual lymph drainage, Compression bandaging, scar mobilization, Taping, Vasopneumatic device, and Manual therapy  PLAN FOR NEXT SESSION: update POC, Cont to instruct in UE bandaging; pt to be measured on 11/23/22 by cassidy for custom day/night garments; cont MLD to L UE and lateral trunk and  instructing pt, PROM for L UE and STM to pect tendon, when independent with bandaging and self MLD add ball and pulleys to instruct in scapular depression with active shoulder A/ROM and add supine scapular series   Manus Gunning, PT 11/08/2022, 1:28 PM

## 2022-11-09 ENCOUNTER — Ambulatory Visit
Admission: RE | Admit: 2022-11-09 | Discharge: 2022-11-09 | Disposition: A | Discharge status: Home / Self Care | Payer: No Typology Code available for payment source | Source: Ambulatory Visit | Attending: Radiation Oncology | Admitting: Radiation Oncology

## 2022-11-09 ENCOUNTER — Other Ambulatory Visit: Payer: Self-pay

## 2022-11-09 LAB — RAD ONC ARIA SESSION SUMMARY
Course Elapsed Days: 21
Plan Fractions Treated to Date: 8
Plan Prescribed Dose Per Fraction: 1.8 Gy
Plan Total Fractions Prescribed: 14
Plan Total Prescribed Dose: 25.2 Gy
Reference Point Dosage Given to Date: 14.4 Gy
Reference Point Session Dosage Given: 1.8 Gy
Session Number: 16

## 2022-11-10 ENCOUNTER — Ambulatory Visit: Payer: No Typology Code available for payment source | Admitting: Physical Therapy

## 2022-11-10 ENCOUNTER — Ambulatory Visit
Admission: RE | Admit: 2022-11-10 | Discharge: 2022-11-10 | Disposition: A | Discharge status: Home / Self Care | Payer: No Typology Code available for payment source | Source: Ambulatory Visit | Attending: Radiation Oncology | Admitting: Radiation Oncology

## 2022-11-10 ENCOUNTER — Encounter: Payer: Self-pay | Admitting: Physical Therapy

## 2022-11-10 ENCOUNTER — Other Ambulatory Visit: Payer: Self-pay

## 2022-11-10 DIAGNOSIS — I972 Postmastectomy lymphedema syndrome: Secondary | ICD-10-CM

## 2022-11-10 DIAGNOSIS — R293 Abnormal posture: Secondary | ICD-10-CM

## 2022-11-10 DIAGNOSIS — M25612 Stiffness of left shoulder, not elsewhere classified: Secondary | ICD-10-CM

## 2022-11-10 DIAGNOSIS — Z170 Estrogen receptor positive status [ER+]: Secondary | ICD-10-CM

## 2022-11-10 DIAGNOSIS — Z483 Aftercare following surgery for neoplasm: Secondary | ICD-10-CM

## 2022-11-10 LAB — RAD ONC ARIA SESSION SUMMARY
Course Elapsed Days: 22
Plan Fractions Treated to Date: 9
Plan Prescribed Dose Per Fraction: 1.8 Gy
Plan Total Fractions Prescribed: 14
Plan Total Prescribed Dose: 25.2 Gy
Reference Point Dosage Given to Date: 16.2 Gy
Reference Point Session Dosage Given: 1.8 Gy
Session Number: 17

## 2022-11-10 NOTE — Therapy (Signed)
OUTPATIENT PHYSICAL THERAPY ONCOLOGY TREATMENT  Patient Name: [name] MRN: [id number] DOB:1976-02-15, 47 y.o., female Today's Date: 11/10/2022  END OF SESSION:  PT End of Session - 11/10/22 1306     Visit Number 10    Number of Visits 18    Date for PT Re-Evaluation 12/08/22    Authorization Type CAFA expires 7/28    PT Start Time 1105    PT Stop Time 1203    PT Time Calculation (min) 58 min    Activity Tolerance Patient tolerated treatment well    Behavior During Therapy [facility] for tasks assessed/performed                  Past Medical History:  Diagnosis Date   Allergy    Asthma    Breast cancer in female [facility])    Left   Breast disorder    breast cancer May 2021   Chronic back pain    Headache    High triglycerides    History of breast cancer    History of COVID-19 04/20/2021   Hx of migraines    Morbid obesity ([place])    Personal history of chemotherapy    Personal history of radiation therapy    Past Surgical History:  Procedure Laterality Date   BREAST LUMPECTOMY     BREAST LUMPECTOMY WITH RADIOACTIVE SEED AND SENTINEL LYMPH NODE BIOPSY Left 07/15/2020   Procedure: LEFT BREAST LUMPECTOMY WITH RADIOACTIVE SEED AND LEFT AXILLARY SENTINEL LYMPH NODE BIOPSY, LEFT AXILLARY NODE RADIOACTIVE SEED GUIDED EXCISION, LEFT BREAST RADIOACTIVE SEED GUIDED EXCISION IM NODE;  Surgeon: Rolm Bookbinder, MD;  Location: [facility];  Service: General;  Laterality: Left;  PEC BLOCK   CESAREAN SECTION WITH BILATERAL TUBAL LIGATION Bilateral 07/23/2015   Procedure: CESAREAN SECTION WITH BILATERAL TUBAL LIGATION;  Surgeon: Donnamae Jude, MD;  Location: [facility];  Service: Obstetrics;  Laterality: Bilateral;   IR IMAGING GUIDED PORT INSERTION  02/11/2022   MODIFIED MASTECTOMY Left 08/22/2022   Procedure: LEFT MODIFIED RADICAL MASTECTOMY, LEFT AXILLARY NODE DISSECTION, AND SKIN BIOPSY OF BACK;  Surgeon: Rolm Bookbinder, MD;  Location: [facility];  Service: General;  Laterality:  Left;   PORT-A-CATH REMOVAL N/A 04/30/2021   Procedure: REMOVAL PORT-A-CATH;  Surgeon: Rolm Bookbinder, MD;  Location: [facility];  Service: General;  Laterality: N/A;   PORTACATH PLACEMENT Right 02/11/2020   Procedure: INSERTION PORT-A-CATH WITH ULTRASOUND GUIDANCE;  Surgeon: Rolm Bookbinder, MD;  Location: [place];  Service: General;  Laterality: Right;   TUBAL LIGATION     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[place]) 04/01/2022   Encounter for dental examination 03/11/2022   Acute apical periodontitis 03/11/2022   Phobia of dental procedure 03/11/2022   Defective dental restoration 03/11/2022   Accretions on teeth 03/11/2022   Chronic periodontitis 03/11/2022   Teeth missing 03/11/2022   Caries 03/11/2022   Generalized gingival recession 03/11/2022   Pain, dental 03/08/2022   Cancer-related pain 03/08/2022   Port-A-Cath in place 02/15/2022   Oral allergy syndrome, subsequent encounter 02/03/2022   Adverse effect of other drugs, medicaments and biological substances, subsequent encounter 02/02/2022   Other adverse food reactions, not elsewhere classified, subsequent encounter 02/02/2022   Chest tightness 02/02/2022   Other  allergic rhinitis 02/02/2022   Allergic conjunctivitis of both eyes 02/02/2022   Language barrier 12/11/2020   Morbid obesity ([place])    History of breast cancer    Genetic testing 02/13/2020   Malignant neoplasm of upper-outer quadrant of left breast in female, estrogen receptor positive ([place]) 01/27/2020   Impaired ability to use community resources due to language barrier 07/13/2013    PCP: Lazaro Arms, NP  REFERRING PROVIDER: Benay Pike, MD  REFERRING DIAG: Left UE  lymphedema  THERAPY DIAG:  Postmastectomy lymphedema  Stiffness of left shoulder, not elsewhere classified  Aftercare following surgery for neoplasm  Abnormal posture  Malignant neoplasm of upper-outer quadrant of left breast in female, estrogen receptor positive ([place])  ONSET DATE: 08/01/22  Rationale for Evaluation and Treatment: Rehabilitation  SUBJECTIVE:                                                                                                                                                                                           SUBJECTIVE STATEMENT: It has been better with the bandaging. I have been doing it day and night.   PERTINENT HISTORY:  Patient was diagnosed on 01/23/2020 with left grade III invasive ductal carcinoma breast cancer. It is ER positive, PR negative, and HER2 negative , but functionally Triple Negative,with a Ki67 of 40%. Patient with interpreter present. She underwent neoadjuvant chemotherapy from 02/12/2020 - 05/28/2020. 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 She underwent a left lumpectomy and sentinel node biopsy ( 4 negative nodes) on 07/15/2020. She underwent radiation and anti-estrogen therapy.  She had interval development of diffuse skin thickening in 2023. She had a punch biopsy of her skin at that time and this was consistent with inflammatory breast cancer. She has been treated with chemo since then. She had a left modified radical mastectomy on 08/22/2022 with complete therapeutic response and 0/1 LN. She noted some swelling in her left arm and on 08/25/2022 she had an [place] that was negative for DVT. Her SOZO screen on 09/19/2021 was 6.9 points above baseline putting her in the yellow zone and she was given a compression sleeve.  PAIN:  Are you having pain? No feeling better since bandaging has gotten arm smaller  PRECAUTIONS: Other: lymphedema [name]  WEIGHT BEARING RESTRICTIONS: No  FALLS:  Has patient fallen in last 6 months?  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  LEISURE: pt does not exercise  HAND DOMINANCE: right   PRIOR LEVEL OF FUNCTION: Independent  PATIENT GOALS: get the swelling down, improve L shoulder ROM, decrease pain   OBJECTIVE:  COGNITION: Overall cognitive status: Within functional limits for tasks assessed   PALPATION: Decreased scar mobility  OBSERVATIONS / OTHER ASSESSMENTS: scar well healed, increased edema at lateral trunk and throughout [name]  POSTURE: forward head, rounded shoulders  UPPER EXTREMITY AROM/PROM:  A/PROM RIGHT   eval   Shoulder extension 68  Shoulder flexion 160  Shoulder abduction 175  Shoulder internal rotation 56  Shoulder external rotation 88    (Blank rows = not tested)  A/PROM LEFT   eval LEFT 11/10/22  Shoulder extension 52   Shoulder flexion 146 150  Shoulder abduction 148 160  Shoulder internal rotation 67   Shoulder external rotation 86     (Blank rows = not tested)   LYMPHEDEMA ASSESSMENTS:  SURGERY TYPE/DATE: Left Lumpectomy with SLNB11/11/2019, Left modified mastectomy with left axillary node dissection NUMBER OF LYMPH NODES REMOVED: 0/4 with lumpectomy, 0/1 during Mastectomy CHEMOTHERAPY: yes RADIATION:Yes HORMONE TREATMENT: may require INFECTIONS: none  LYMPHEDEMA ASSESSMENTS:  LANDMARK RIGHT  eval RIGHT 11/08/22  10 cm proximal to olecranon process 31.4 34.4  Olecranon process 27.'2 28  10 '$ cm proximal to ulnar styloid process 25.7 25.5  Just proximal to ulnar styloid process 18.4 19  Across hand at thumb web space 20 20  At base of 2nd digit 6.4 6.5  (Blank rows = not tested)  LANDMARK LEFT  eval  LEFT 10/25/22 Left  11/03/22 LEFT 11/08/22  10 cm proximal to olecranon process 33 33 31.9 33  Olecranon process 28.4 28.5 28.'5 29  10 '$ cm proximal to ulnar styloid process 26 26.5 26.4 26.6  Just proximal to ulnar styloid process 19.4 19 18.9 19.1  Across hand at thumb web space 21 20.5 19.7 20.7  2nd digit    6.7   TODAY'S TREATMENT:   11/10/22 11/03/22: Manual Therapy PROM to L shoulder in supine: in to flexion and abduction with increased tightness noted in axilla MLD: Short neck, superficial and deep abdominals, Lt inguinal and Rt axillary nodes, Lt axillo-inguinal and anterior inter-axillary anastomosis, then to R s/l to work on L lateral trunk where pt has increased edema then retracing all steps  Compression Bandaging: Cocoa butter, TG soft from hand to axilla,  mollelast to digits 1-4 with therapist performing first 2 digits and pt completing last 2 , artiflex from hand to axilla, 1 6 cm bandage at hand (reviewing technique while performing) with 1/2" gray foam to dorsal aspect that covered past wrist (pt was instructed she can try this but does not have to use, but please bring back), 1 8 cm bandage from wrist to axilla with x at antecubital fossa and 1 12 cm bandage from wrist to axilla reviewing correct spacing and tension while performing.  DATE:  11/08/22: Measured pt for flat knit compression sleeve and glove and pt did not fit in to an off the shelf night time garment or day time glove so signed pt up to be measured by Primitivo Gauze on 11/23/22. Pt did not fit in to an off the shelf circaid profile night time garment, a medi mondi off the shelf or a tribute wrap night time.  Spent majority of session educated pt about lymphedema and answering pt's questions about lymphedema - see assessment 11/03/22: Manual Therapy Remeasured circumference after doffing  bandages assessing her technique while removing.  MLD: Short neck, superficial and deep abdominals, Lt inguinal and Rt axillary nodes, Lt axillo-inguinal and anterior inter-axillary anastomosis, then Lt UE working from proximal to distal then retracing all step reviewing correct light pressure and skin stretch only, no sliding on skin, while performing on pt.  Compression Bandaging: Cocoa butter, TG soft from hand to axilla,  mollelast to digits 1-4 with therapist performing first 2 digits and pt completing last 2 , artiflex from hand to axilla, 1 6 cm bandage at hand (reviewing technique while performing) with 1/2" gray foam to dorsal aspect that covered past wrist (pt was instructed she can try this but does not have to use, but please bring back), 1 8 cm bandage from wrist to axilla with x at antecubital fossa and 1 12 cm bandage from wrist to axilla reviewing correct spacing and tension while performing.   11/01/22 Spent session instructing pt in how to apply compression bandaging independently at home as follows : TG soft from hand to axilla,  mollelast to digits 1-4 , artiflex from hand to axilla, 1 6 cm bandage at hand, 1 8 cm bandage from wrist to axilla with x at antecubital fossa and 1 12 cm bandage from wrist to axilla. Therapist provided v/c and t/c cues throughout and her interpreter typed out the instructions in Spanish. Educated pt to remove bandages and reapply prior to next appt so therapist can see how she applies them.   10/27/22: Self Care Pt returns having asked about possible infection when at radiation and they were not concerned with this being a possibility. Also today she is not visibly red at lateral trunk. Pt was asking about how ling we thought the swelling would last so had an in depth conversation about how she has had 2 lymph node surgeries now and that she is at a very high risk for developing lymphedema. Also that we area seeming to be catching her while she is transitioning  from subclinical lymphedema to lymphedema. Pt became tearful during session but was able to verbalize a good understanding and asked appropriate questions throughout. Also redid her SOZO and today her change from baseline was elevated back in the yellow. So decided that for now pt will cont to wear Medi Harmony prophylactic sleeve and gauntlet but that she will probably need to be measured for a flat knit sleeve and glove soon. Explained difference between circular knit and flat knit garments by showing her a flat knit so she could feel difference of garments in comparison to what she has been wearing. Also pt is on board for more instruction in self bandaging as she may need to be doing this as radiation continues which could potentially worsen her lymphedema.  Manual Therapy MLD as time allowed at end of session to Lt UE as follows: Short neck, superficial abdominals and 5 diaphragmatic breaths, Lt inguinal and Rt axillary nodes, Lt axillo-inguinal and anterior  inter-axillary anastomosis, then Lt UE working from proximal to distal then retracing all steps. Reviewed technique with pt and she was able to return good demo of correct pressure but did need hand over hand cuing for correct skin stretch, not slide. Pt was then able to return good demo.  P/ROM to Lt shoulder briefly into flexion and abd with scapular depression by therapist throughout. She reports pect tendon tightness and her scapula is very elevated which is probably contributing to arm pain she mentions has been intermittent for weeks now.  STM along tight Lt pect tendon when in end range shoulder stretches    PATIENT EDUCATION:  Education details: lymphedema, anatomy and physiology of the lymphatic system, how lymphedema is treated, subclinical lymphedema vs lymphedema, need for compression bra Person educated: Patient Education method: Explanation Education comprehension: verbalized understanding  HOME EXERCISE PROGRAM: Obtain  compression bra, continue to wear sleeve and glove  ASSESSMENT:  CLINICAL IMPRESSION: Assessed pt's progress towards goals in therapy. She is making progress towards her goals. She is now able to perform MLD for her L UE and she is able to bandage her [name] independently. She is still limited with L shoulder ROM and has increased L axillary tightness. She will be measured for compression garments next month. She has increased edema in L lateral trunk and inferior to her mastectomy scar. She would benefit from continued skilled PT services to decrease L lateral trunk edema, improve L shoulder ROM and decrease tightness.  OBJECTIVE IMPAIRMENTS: decreased knowledge of condition, decreased ROM, decreased strength, increased edema, increased fascial restrictions, impaired UE functional use, postural dysfunction, and pain.   ACTIVITY LIMITATIONS: carrying, lifting, and reach over head  PARTICIPATION LIMITATIONS: cleaning, laundry, community activity, occupation, and yard work  PERSONAL FACTORS: Time since onset of injury/illness/exacerbation are also affecting patient's functional outcome.   REHAB POTENTIAL: Good  CLINICAL DECISION MAKING: Stable/uncomplicated  EVALUATION COMPLEXITY: Low  GOALS: Goals reviewed with patient? Yes   SHORT TERM GOALS = LONG TERM GOALS: Target date: 11/09/22  Pt will be independent in self MLD for long term management of lymphedema.  Baseline:  Goal status: IN PROGRESS 11/10/22- pt has been practicing but does not feel completely independent  2.  Pt will obtain appropriate compression garments for long term management of lymphedema.  Baseline:  Goal status: IN PROGRESS  3.  Pt will demonstrate 160 degrees of L shoulder flexion to allow her to reach overhead. Baseline:  Goal status: IN PROGRESS 11/10/22 - 150  4. Pt will demonstrate 170 degrees of L shoulder abduction to allow her to reach out to the side.   Goal: IN PROGRESS 11/10/22 - 160  5. Pt will be  independent in a home exercise program for continued stretching and strengthening.  Goal: IN PROGRESS 11/10/22  6. Pt will report she is able to raise her L arm to end range wihtout increased pain in her axilla to allow improved comfort.   GOAL: IN PROGRESS - pt still having pain when L UE ROM 11/10/22   PLAN:  PT FREQUENCY: 2x/week  PT DURATION: 4 weeks  PLANNED INTERVENTIONS: Therapeutic exercises, Therapeutic activity, Patient/Family education, Self Care, Joint mobilization, Manual lymph drainage, Compression bandaging, scar mobilization, Taping, Vasopneumatic device, and Manual therapy  PLAN FOR NEXT SESSION:pt to be measured on 11/23/22 by cassidy for custom day/night garments; cont MLD to L UE and lateral trunk and instructing pt, PROM for L UE and STM to pect tendon,  add ball and pulleys to instruct in  scapular depression with active shoulder A/ROM and add supine scapular series   Manus Gunning, PT 11/10/2022, 1:07 PM

## 2022-11-11 ENCOUNTER — Other Ambulatory Visit: Payer: Self-pay

## 2022-11-11 ENCOUNTER — Ambulatory Visit
Admission: RE | Admit: 2022-11-11 | Discharge: 2022-11-11 | Disposition: A | Discharge status: Home / Self Care | Payer: No Typology Code available for payment source | Source: Ambulatory Visit | Attending: Radiation Oncology | Admitting: Radiation Oncology

## 2022-11-11 DIAGNOSIS — Z510 Encounter for antineoplastic radiation therapy: Secondary | ICD-10-CM | POA: Insufficient documentation

## 2022-11-11 DIAGNOSIS — Z170 Estrogen receptor positive status [ER+]: Secondary | ICD-10-CM | POA: Insufficient documentation

## 2022-11-11 DIAGNOSIS — C50412 Malignant neoplasm of upper-outer quadrant of left female breast: Secondary | ICD-10-CM | POA: Insufficient documentation

## 2022-11-11 LAB — RAD ONC ARIA SESSION SUMMARY
Course Elapsed Days: 23
Plan Fractions Treated to Date: 9
Plan Prescribed Dose Per Fraction: 1.8 Gy
Plan Total Fractions Prescribed: 14
Plan Total Prescribed Dose: 25.2 Gy
Reference Point Dosage Given to Date: 16.2 Gy
Reference Point Session Dosage Given: 1.8 Gy
Session Number: 18

## 2022-11-14 ENCOUNTER — Other Ambulatory Visit: Payer: Self-pay

## 2022-11-14 ENCOUNTER — Ambulatory Visit: Payer: No Typology Code available for payment source

## 2022-11-14 ENCOUNTER — Ambulatory Visit
Admission: RE | Admit: 2022-11-14 | Discharge: 2022-11-14 | Disposition: A | Discharge status: Home / Self Care | Payer: No Typology Code available for payment source | Source: Ambulatory Visit | Attending: Radiation Oncology

## 2022-11-14 ENCOUNTER — Ambulatory Visit
Admission: RE | Admit: 2022-11-14 | Discharge: 2022-11-14 | Disposition: A | Discharge status: Home / Self Care | Payer: No Typology Code available for payment source | Source: Ambulatory Visit | Attending: Radiation Oncology | Admitting: Radiation Oncology

## 2022-11-14 LAB — RAD ONC ARIA SESSION SUMMARY
Course Elapsed Days: 26
Plan Fractions Treated to Date: 10
Plan Prescribed Dose Per Fraction: 1.8 Gy
Plan Total Fractions Prescribed: 14
Plan Total Prescribed Dose: 25.2 Gy
Reference Point Dosage Given to Date: 18 Gy
Reference Point Session Dosage Given: 1.8 Gy
Session Number: 19

## 2022-11-15 ENCOUNTER — Ambulatory Visit
Admission: RE | Admit: 2022-11-15 | Discharge: 2022-11-15 | Disposition: A | Discharge status: Home / Self Care | Payer: No Typology Code available for payment source | Source: Ambulatory Visit | Attending: Radiation Oncology | Admitting: Radiation Oncology

## 2022-11-15 ENCOUNTER — Other Ambulatory Visit: Payer: Self-pay

## 2022-11-15 ENCOUNTER — Ambulatory Visit: Payer: No Typology Code available for payment source | Attending: Hematology and Oncology

## 2022-11-15 DIAGNOSIS — M6281 Muscle weakness (generalized): Secondary | ICD-10-CM | POA: Insufficient documentation

## 2022-11-15 DIAGNOSIS — Z170 Estrogen receptor positive status [ER+]: Secondary | ICD-10-CM | POA: Insufficient documentation

## 2022-11-15 DIAGNOSIS — M546 Pain in thoracic spine: Secondary | ICD-10-CM | POA: Insufficient documentation

## 2022-11-15 DIAGNOSIS — R293 Abnormal posture: Secondary | ICD-10-CM

## 2022-11-15 DIAGNOSIS — I972 Postmastectomy lymphedema syndrome: Secondary | ICD-10-CM

## 2022-11-15 DIAGNOSIS — C50412 Malignant neoplasm of upper-outer quadrant of left female breast: Secondary | ICD-10-CM

## 2022-11-15 DIAGNOSIS — M5459 Other low back pain: Secondary | ICD-10-CM | POA: Insufficient documentation

## 2022-11-15 DIAGNOSIS — M25612 Stiffness of left shoulder, not elsewhere classified: Secondary | ICD-10-CM

## 2022-11-15 DIAGNOSIS — Z483 Aftercare following surgery for neoplasm: Secondary | ICD-10-CM

## 2022-11-15 LAB — RAD ONC ARIA SESSION SUMMARY
Course Elapsed Days: 27
Plan Fractions Treated to Date: 10
Plan Prescribed Dose Per Fraction: 1.8 Gy
Plan Total Fractions Prescribed: 14
Plan Total Prescribed Dose: 25.2 Gy
Reference Point Dosage Given to Date: 18 Gy
Reference Point Session Dosage Given: 1.8 Gy
Session Number: 20

## 2022-11-15 NOTE — Therapy (Signed)
OUTPATIENT PHYSICAL THERAPY ONCOLOGY TREATMENT  Patient Name: Kariana Sielski MRN: [id number] DOB:23-Oct-1975, 47 y.o., female Today's Date: 11/15/2022  END OF SESSION:  PT End of Session - 11/15/22 1458     Visit Number 11    Number of Visits 18    Date for PT Re-Evaluation 12/08/22    Authorization Type CAFA expires 7/28    PT Start Time 1500    PT Stop Time 1600    PT Time Calculation (min) 60 min    Activity Tolerance Patient tolerated treatment well    Behavior During Therapy [facility] for tasks assessed/performed                  Past Medical History:  Diagnosis Date   Allergy    Asthma    Breast cancer in female [facility])    Left   Breast disorder    breast cancer May 2021   Chronic back pain    Headache    High triglycerides    History of breast cancer    History of COVID-19 04/20/2021   Hx of migraines    Morbid obesity ([place])    Personal history of chemotherapy    Personal history of radiation therapy    Past Surgical History:  Procedure Laterality Date   BREAST LUMPECTOMY     BREAST LUMPECTOMY WITH RADIOACTIVE SEED AND SENTINEL LYMPH NODE BIOPSY Left 07/15/2020   Procedure: LEFT BREAST LUMPECTOMY WITH RADIOACTIVE SEED AND LEFT AXILLARY SENTINEL LYMPH NODE BIOPSY, LEFT AXILLARY NODE RADIOACTIVE SEED GUIDED EXCISION, LEFT BREAST RADIOACTIVE SEED GUIDED EXCISION IM NODE;  Surgeon: Rolm Bookbinder, MD;  Location: [place];  Service: General;  Laterality: Left;  PEC BLOCK   CESAREAN SECTION WITH BILATERAL TUBAL LIGATION Bilateral 07/23/2015   Procedure: CESAREAN SECTION WITH BILATERAL TUBAL LIGATION;  Surgeon: Donnamae Jude, MD;  Location: [facility];  Service: Obstetrics;  Laterality: Bilateral;   IR IMAGING GUIDED PORT INSERTION  02/11/2022   MODIFIED MASTECTOMY Left 08/22/2022   Procedure: LEFT MODIFIED RADICAL MASTECTOMY, LEFT AXILLARY NODE DISSECTION, AND SKIN BIOPSY OF BACK;  Surgeon: Rolm Bookbinder, MD;  Location: [facility];  Service: General;  Laterality:  Left;   PORT-A-CATH REMOVAL N/A 04/30/2021   Procedure: REMOVAL PORT-A-CATH;  Surgeon: Rolm Bookbinder, MD;  Location: [place];  Service: General;  Laterality: N/A;   PORTACATH PLACEMENT Right 02/11/2020   Procedure: INSERTION PORT-A-CATH WITH ULTRASOUND GUIDANCE;  Surgeon: Rolm Bookbinder, MD;  Location: [place];  Service: General;  Laterality: Right;   TUBAL LIGATION     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[place]) 04/01/2022   Encounter for dental examination 03/11/2022   Acute apical periodontitis 03/11/2022   Phobia of dental procedure 03/11/2022   Defective dental restoration 03/11/2022   Accretions on teeth 03/11/2022   Chronic periodontitis 03/11/2022   Teeth missing 03/11/2022   Caries 03/11/2022   Generalized gingival recession 03/11/2022   Pain, dental 03/08/2022   Cancer-related pain 03/08/2022   Port-A-Cath in place 02/15/2022   Oral allergy syndrome, subsequent encounter 02/03/2022   Adverse effect of other drugs, medicaments and biological substances, subsequent encounter 02/02/2022   Other adverse food reactions, not elsewhere classified, subsequent encounter 02/02/2022   Chest tightness 02/02/2022   Other  allergic rhinitis 02/02/2022   Allergic conjunctivitis of both eyes 02/02/2022   Language barrier 12/11/2020   Morbid obesity ([place])    History of breast cancer    Genetic testing 02/13/2020   Malignant neoplasm of upper-outer quadrant of left breast in female, estrogen receptor positive ([place]) 01/27/2020   Impaired ability to use community resources due to language barrier 07/13/2013    PCP: Lazaro Arms, NP  REFERRING PROVIDER: Benay Pike, MD  REFERRING DIAG: Left UE  lymphedema  THERAPY DIAG:  Postmastectomy lymphedema  Stiffness of left shoulder, not elsewhere classified  Aftercare following surgery for neoplasm  Abnormal posture  Malignant neoplasm of upper-outer quadrant of left breast in female, estrogen receptor positive ([place])  ONSET DATE: 08/01/22  Rationale for Evaluation and Treatment: Rehabilitation  SUBJECTIVE:                                                                                                                                                                                           SUBJECTIVE STATEMENT:  It looks good with the bandages but everything comes back when I take the compression off. I have pain when I raise my arm.   PERTINENT HISTORY:  Patient was diagnosed on 01/23/2020 with left grade III invasive ductal carcinoma breast cancer. It is ER positive, PR negative, and HER2 negative , but functionally Triple Negative,with a Ki67 of 40%. Patient with interpreter present. She underwent neoadjuvant chemotherapy from 02/12/2020 - 05/28/2020. 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 She underwent a left lumpectomy and sentinel node biopsy ( 4 negative nodes) on 07/15/2020. She underwent radiation and anti-estrogen therapy.  She had interval development of diffuse skin thickening in 2023. She had a punch biopsy of her skin at that time and this was consistent with inflammatory breast cancer. She has been treated with chemo since then. She had a left modified radical mastectomy on 08/22/2022 with complete therapeutic response and 0/1 LN. She noted some swelling in her left arm and on 08/25/2022 she had an [place] that was negative for DVT. Her SOZO screen on 09/19/2021 was 6.9 points above baseline putting her in the yellow zone and she was given a compression sleeve.  PAIN:  Are you having pain? No feeling better since bandaging has gotten arm smaller  PRECAUTIONS: Other: lymphedema [name]  WEIGHT BEARING RESTRICTIONS:  No  FALLS:  Has patient fallen in last 6 months? 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  LEISURE: pt does not exercise  HAND DOMINANCE: right   PRIOR LEVEL OF FUNCTION: Independent  PATIENT GOALS: get the swelling down, improve L shoulder ROM, decrease pain   OBJECTIVE:  COGNITION: Overall cognitive status: Within functional limits for tasks assessed   PALPATION: Decreased scar mobility  OBSERVATIONS / OTHER ASSESSMENTS: scar well healed, increased edema at lateral trunk and throughout [name]  POSTURE: forward head, rounded shoulders  UPPER EXTREMITY AROM/PROM:  A/PROM RIGHT   eval   Shoulder extension 68  Shoulder flexion 160  Shoulder abduction 175  Shoulder internal rotation 56  Shoulder external rotation 88    (Blank rows = not tested)  A/PROM LEFT   eval LEFT 11/10/22  Shoulder extension 52   Shoulder flexion 146 150  Shoulder abduction 148 160  Shoulder internal rotation 67   Shoulder external rotation 86     (Blank rows = not tested)   LYMPHEDEMA ASSESSMENTS:  SURGERY TYPE/DATE: Left Lumpectomy with SLNB11/11/2019, Left modified mastectomy with left axillary node dissection NUMBER OF LYMPH NODES REMOVED: 0/4 with lumpectomy, 0/1 during Mastectomy CHEMOTHERAPY: yes RADIATION:Yes HORMONE TREATMENT: may require INFECTIONS: none  LYMPHEDEMA ASSESSMENTS:  LANDMARK RIGHT  eval RIGHT 11/08/22  10 cm proximal to olecranon process 31.4 34.4  Olecranon process 27.'2 28  10 '$ cm proximal to ulnar styloid process 25.7 25.5  Just proximal to ulnar styloid process 18.4 19  Across hand at thumb web space 20 20  At base of 2nd digit 6.4 6.5   (Blank rows = not tested)  LANDMARK LEFT  eval LEFT 10/25/22 Left  11/03/22 LEFT 11/08/22  10 cm proximal to olecranon process 33 33 31.9 33  Olecranon process 28.4 28.5 28.'5 29  10 '$ cm proximal to ulnar styloid process 26 26.5 26.4 26.6  Just proximal to ulnar styloid process 19.4 19 18.9 19.1  Across hand at thumb web space 21 20.5 19.7 20.7  2nd digit    6.7   TODAY'S TREATMENT:   11/15/2022  Pulleys x 1:30 flexion, scaption, abd Wall slides x 5 Discussed purchasing pulleys for home use and showed pulleys on [employer] MLD: Short neck, superficial and deep abdominals, Lt inguinal and Rt axillary nodes, Lt axillo-inguinal and anterior inter-axillary anastomosis, then to R s/l to work on L lateral trunk where pt has increased edema then retracing all steps  Compression Bandaging: Cocoa butter, TG soft from hand to axilla,  mollelast to digits 1-4 with  , gray foam over dorsum of hand and wrist, artiflex from hand to axilla, 1 6 cm bandage at hand, 1 8 cm bandage from wrist to axilla with x at antecubital fossa and 1 12 cm bandage from wrist to axilla reviewing correct spacing and tension while performing. Pt educated to wash bandages next time she has sleeve on and instructed how to wash and dry. Pt with questions through interpreter about if she would have to wear the sleeve forever and use the night garment also.  When told yes she did get a little teary but recovered quickly.  11/10/22 11/03/22: Manual Therapy PROM to L shoulder in supine: in to flexion and abduction with increased tightness noted in axilla MLD: Short neck, superficial and deep abdominals, Lt inguinal and Rt axillary nodes, Lt axillo-inguinal and anterior inter-axillary anastomosis, then to R s/l to work on L lateral trunk where pt has increased  edema then retracing all steps  Compression Bandaging: Cocoa butter, TG soft from hand to axilla,  mollelast to digits 1-4 with therapist performing first 2 digits and pt completing last  2 , artiflex from hand to axilla, 1 6 cm bandage at hand (reviewing technique while performing) with 1/2" gray foam to dorsal aspect that covered past wrist (pt was instructed she can try this but does not have to use, but please bring back), 1 8 cm bandage from wrist to axilla with x at antecubital fossa and 1 12 cm bandage from wrist to axilla reviewing correct spacing and tension while performing.                                                                                                                                        DATE:  11/08/22: Measured pt for flat knit compression sleeve and glove and pt did not fit in to an off the shelf night time garment or day time glove so signed pt up to be measured by Primitivo Gauze on 11/23/22. Pt did not fit in to an off the shelf circaid profile night time garment, a medi mondi off the shelf or a tribute wrap night time.  Spent majority of session educated pt about lymphedema and answering pt's questions about lymphedema - see assessment 11/03/22: Manual Therapy Remeasured circumference after doffing bandages assessing her technique while removing.  MLD: Short neck, superficial and deep abdominals, Lt inguinal and Rt axillary nodes, Lt axillo-inguinal and anterior inter-axillary anastomosis, then Lt UE working from proximal to distal then retracing all step reviewing correct light pressure and skin stretch only, no sliding on skin, while performing on pt.  Compression Bandaging: Cocoa butter, TG soft from hand to axilla,  mollelast to digits 1-4 with therapist performing first 2 digits and pt completing last 2 , artiflex from hand to axilla, 1 6 cm bandage at hand (reviewing technique while performing) with 1/2" gray foam to dorsal aspect that covered past wrist (pt was instructed she can try this but does not have to use, but please bring back), 1 8 cm bandage from wrist to axilla with x at antecubital fossa and 1 12 cm bandage from wrist to axilla reviewing correct  spacing and tension while performing.   11/01/22 Spent session instructing pt in how to apply compression bandaging independently at home as follows : TG soft from hand to axilla,  mollelast to digits 1-4 , artiflex from hand to axilla, 1 6 cm bandage at hand, 1 8 cm bandage from wrist to axilla with x at antecubital fossa and 1 12 cm bandage from wrist to axilla. Therapist provided v/c and t/c cues throughout and her interpreter typed out the instructions in Spanish. Educated pt to remove bandages and reapply prior to next appt so therapist can see how she applies them.   10/27/22: Self Care Pt returns having asked about possible infection  when at radiation and they were not concerned with this being a possibility. Also today she is not visibly red at lateral trunk. Pt was asking about how ling we thought the swelling would last so had an in depth conversation about how she has had 2 lymph node surgeries now and that she is at a very high risk for developing lymphedema. Also that we area seeming to be catching her while she is transitioning from subclinical lymphedema to lymphedema. Pt became tearful during session but was able to verbalize a good understanding and asked appropriate questions throughout. Also redid her SOZO and today her change from baseline was elevated back in the yellow. So decided that for now pt will cont to wear Medi Harmony prophylactic sleeve and gauntlet but that she will probably need to be measured for a flat knit sleeve and glove soon. Explained difference between circular knit and flat knit garments by showing her a flat knit so she could feel difference of garments in comparison to what she has been wearing. Also pt is on board for more instruction in self bandaging as she may need to be doing this as radiation continues which could potentially worsen her lymphedema.  Manual Therapy MLD as time allowed at end of session to Lt UE as follows: Short neck, superficial abdominals  and 5 diaphragmatic breaths, Lt inguinal and Rt axillary nodes, Lt axillo-inguinal and anterior inter-axillary anastomosis, then Lt UE working from proximal to distal then retracing all steps. Reviewed technique with pt and she was able to return good demo of correct pressure but did need hand over hand cuing for correct skin stretch, not slide. Pt was then able to return good demo.  P/ROM to Lt shoulder briefly into flexion and abd with scapular depression by therapist throughout. She reports pect tendon tightness and her scapula is very elevated which is probably contributing to arm pain she mentions has been intermittent for weeks now.  STM along tight Lt pect tendon when in end range shoulder stretches    PATIENT EDUCATION:  Education details: lymphedema, anatomy and physiology of the lymphatic system, how lymphedema is treated, subclinical lymphedema vs lymphedema, need for compression bra Person educated: Patient Education method: Explanation Education comprehension: verbalized understanding  HOME EXERCISE PROGRAM: Obtain compression bra, continue to wear sleeve and glove  ASSESSMENT:  CLINICAL IMPRESSION: Pt was instructed in the use of pulleys and AAROM for wall slides as she was very tight there. She loosened up nicely and liked the pulleys so was educated in how she may purchase them on [employer]. ROM improved significantly after pulleys and pain was improved. Advised to stretch 2x/daily to keep shoulder looser.  OBJECTIVE IMPAIRMENTS: decreased knowledge of condition, decreased ROM, decreased strength, increased edema, increased fascial restrictions, impaired UE functional use, postural dysfunction, and pain.   ACTIVITY LIMITATIONS: carrying, lifting, and reach over head  PARTICIPATION LIMITATIONS: cleaning, laundry, community activity, occupation, and yard work  PERSONAL FACTORS: Time since onset of injury/illness/exacerbation are also affecting patient's functional outcome.    REHAB POTENTIAL: Good  CLINICAL DECISION MAKING: Stable/uncomplicated  EVALUATION COMPLEXITY: Low  GOALS: Goals reviewed with patient? Yes   SHORT TERM GOALS = LONG TERM GOALS: Target date: 11/09/22  Pt will be independent in self MLD for long term management of lymphedema.  Baseline:  Goal status: IN PROGRESS 11/10/22- pt has been practicing but does not feel completely independent  2.  Pt will obtain appropriate compression garments for long term management of lymphedema.  Baseline:  Goal status: IN PROGRESS  3.  Pt will demonstrate 160 degrees of L shoulder flexion to allow her to reach overhead. Baseline:  Goal status: IN PROGRESS 11/10/22 - 150  4. Pt will demonstrate 170 degrees of L shoulder abduction to allow her to reach out to the side.   Goal: IN PROGRESS 11/10/22 - 160  5. Pt will be independent in a home exercise program for continued stretching and strengthening.  Goal: IN PROGRESS 11/10/22  6. Pt will report she is able to raise her L arm to end range wihtout increased pain in her axilla to allow improved comfort.   GOAL: IN PROGRESS - pt still having pain when L UE ROM 11/10/22   PLAN:  PT FREQUENCY: 2x/week  PT DURATION: 4 weeks  PLANNED INTERVENTIONS: Therapeutic exercises, Therapeutic activity, Patient/Family education, Self Care, Joint mobilization, Manual lymph drainage, Compression bandaging, scar mobilization, Taping, Vasopneumatic device, and Manual therapy  PLAN FOR NEXT SESSION:pt to be measured on 11/23/22 by cassidy for custom day/night garments; cont MLD to L UE and lateral trunk and instructing pt, PROM for L UE and STM to pect tendon,  add ball and pulleys to instruct in scapular depression with active shoulder A/ROM and add supine scapular series   Claris Pong, PT 11/15/2022, 5:12 PM

## 2022-11-16 ENCOUNTER — Ambulatory Visit
Admission: RE | Admit: 2022-11-16 | Discharge: 2022-11-16 | Disposition: A | Discharge status: Home / Self Care | Payer: No Typology Code available for payment source | Source: Ambulatory Visit | Attending: Radiation Oncology

## 2022-11-16 ENCOUNTER — Ambulatory Visit
Admission: RE | Admit: 2022-11-16 | Discharge: 2022-11-16 | Disposition: A | Discharge status: Home / Self Care | Payer: No Typology Code available for payment source | Source: Ambulatory Visit | Attending: Radiation Oncology | Admitting: Radiation Oncology

## 2022-11-16 ENCOUNTER — Other Ambulatory Visit: Payer: Self-pay

## 2022-11-16 LAB — RAD ONC ARIA SESSION SUMMARY
Course Elapsed Days: 28
Plan Fractions Treated to Date: 11
Plan Prescribed Dose Per Fraction: 1.8 Gy
Plan Total Fractions Prescribed: 14
Plan Total Prescribed Dose: 25.2 Gy
Reference Point Dosage Given to Date: 19.8 Gy
Reference Point Session Dosage Given: 1.8 Gy
Session Number: 21

## 2022-11-17 ENCOUNTER — Ambulatory Visit
Admission: RE | Admit: 2022-11-17 | Discharge: 2022-11-17 | Disposition: A | Discharge status: Home / Self Care | Payer: No Typology Code available for payment source | Source: Ambulatory Visit | Attending: Radiation Oncology | Admitting: Radiation Oncology

## 2022-11-17 ENCOUNTER — Encounter: Payer: Self-pay | Admitting: Radiology

## 2022-11-17 ENCOUNTER — Ambulatory Visit: Payer: No Typology Code available for payment source | Admitting: Rehabilitation

## 2022-11-17 ENCOUNTER — Other Ambulatory Visit: Payer: Self-pay

## 2022-11-17 ENCOUNTER — Encounter: Payer: Self-pay | Admitting: Rehabilitation

## 2022-11-17 DIAGNOSIS — R293 Abnormal posture: Secondary | ICD-10-CM

## 2022-11-17 DIAGNOSIS — Z483 Aftercare following surgery for neoplasm: Secondary | ICD-10-CM

## 2022-11-17 DIAGNOSIS — I972 Postmastectomy lymphedema syndrome: Secondary | ICD-10-CM

## 2022-11-17 DIAGNOSIS — M25612 Stiffness of left shoulder, not elsewhere classified: Secondary | ICD-10-CM

## 2022-11-17 DIAGNOSIS — C50412 Malignant neoplasm of upper-outer quadrant of left female breast: Secondary | ICD-10-CM

## 2022-11-17 DIAGNOSIS — Z170 Estrogen receptor positive status [ER+]: Secondary | ICD-10-CM

## 2022-11-17 LAB — RAD ONC ARIA SESSION SUMMARY
Course Elapsed Days: 29
Plan Fractions Treated to Date: 11
Plan Prescribed Dose Per Fraction: 1.8 Gy
Plan Total Fractions Prescribed: 14
Plan Total Prescribed Dose: 25.2 Gy
Reference Point Dosage Given to Date: 19.8 Gy
Reference Point Session Dosage Given: 1.8 Gy
Session Number: 22

## 2022-11-17 NOTE — Therapy (Signed)
OUTPATIENT PHYSICAL THERAPY ONCOLOGY TREATMENT  Patient Name: [name] MRN: [id number] DOB:11/02/1975, 47 y.o., female Today's Date: 11/17/2022  END OF SESSION:  PT End of Session - 11/17/22 0957     Visit Number 12    Number of Visits 18    Date for PT Re-Evaluation 12/08/22    PT Start Time 1000    PT Stop Time 1050    PT Time Calculation (min) 50 min    Activity Tolerance Patient tolerated treatment well    Behavior During Therapy [facility] for tasks assessed/performed                  Past Medical History:  Diagnosis Date   Allergy    Asthma    Breast cancer in female [facility])    Left   Breast disorder    breast cancer May 2021   Chronic back pain    Headache    High triglycerides    History of breast cancer    History of COVID-19 04/20/2021   Hx of migraines    Morbid obesity ([place])    Personal history of chemotherapy    Personal history of radiation therapy    Past Surgical History:  Procedure Laterality Date   BREAST LUMPECTOMY     BREAST LUMPECTOMY WITH RADIOACTIVE SEED AND SENTINEL LYMPH NODE BIOPSY Left 07/15/2020   Procedure: LEFT BREAST LUMPECTOMY WITH RADIOACTIVE SEED AND LEFT AXILLARY SENTINEL LYMPH NODE BIOPSY, LEFT AXILLARY NODE RADIOACTIVE SEED GUIDED EXCISION, LEFT BREAST RADIOACTIVE SEED GUIDED EXCISION IM NODE;  Surgeon: Rolm Bookbinder, MD;  Location: [place];  Service: General;  Laterality: Left;  PEC BLOCK   CESAREAN SECTION WITH BILATERAL TUBAL LIGATION Bilateral 07/23/2015   Procedure: CESAREAN SECTION WITH BILATERAL TUBAL LIGATION;  Surgeon: Donnamae Jude, MD;  Location: [facility];  Service: Obstetrics;  Laterality: Bilateral;   IR IMAGING GUIDED PORT INSERTION  02/11/2022   MODIFIED MASTECTOMY Left 08/22/2022   Procedure: LEFT MODIFIED RADICAL MASTECTOMY, LEFT AXILLARY NODE DISSECTION, AND SKIN BIOPSY OF BACK;  Surgeon: Rolm Bookbinder, MD;  Location: [facility];  Service: General;  Laterality: Left;   PORT-A-CATH REMOVAL N/A 04/30/2021    Procedure: REMOVAL PORT-A-CATH;  Surgeon: Rolm Bookbinder, MD;  Location: [place];  Service: General;  Laterality: N/A;   PORTACATH PLACEMENT Right 02/11/2020   Procedure: INSERTION PORT-A-CATH WITH ULTRASOUND GUIDANCE;  Surgeon: Rolm Bookbinder, MD;  Location: [place];  Service: General;  Laterality: Right;   TUBAL LIGATION     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[place]) 04/01/2022   Encounter for dental examination 03/11/2022   Acute apical periodontitis 03/11/2022   Phobia of dental procedure 03/11/2022   Defective dental restoration 03/11/2022   Accretions on teeth 03/11/2022   Chronic periodontitis 03/11/2022   Teeth missing 03/11/2022   Caries 03/11/2022   Generalized gingival recession 03/11/2022   Pain, dental 03/08/2022   Cancer-related pain 03/08/2022   Port-A-Cath in place 02/15/2022   Oral allergy syndrome, subsequent encounter 02/03/2022   Adverse effect of other drugs, medicaments and biological substances, subsequent encounter 02/02/2022   Other adverse food reactions, not elsewhere classified, subsequent encounter 02/02/2022   Chest tightness 02/02/2022   Other allergic rhinitis 02/02/2022   Allergic conjunctivitis of  both eyes 02/02/2022   Language barrier 12/11/2020   Morbid obesity ([place])    History of breast cancer    Genetic testing 02/13/2020   Malignant neoplasm of upper-outer quadrant of left breast in female, estrogen receptor positive ([place]) 01/27/2020   Impaired ability to use community resources due to language barrier 07/13/2013    PCP: Lazaro Arms, NP  REFERRING PROVIDER: Benay Pike, MD  REFERRING DIAG: Left UE lymphedema  THERAPY DIAG:  Postmastectomy  lymphedema  Stiffness of left shoulder, not elsewhere classified  Aftercare following surgery for neoplasm  Abnormal posture  Malignant neoplasm of upper-outer quadrant of left breast in female, estrogen receptor positive ([place])  ONSET DATE: 08/01/22  Rationale for Evaluation and Treatment: Rehabilitation  SUBJECTIVE:                                                                                                                                                                                           SUBJECTIVE STATEMENT:   The arm just gets itchy  PERTINENT HISTORY:  Patient was diagnosed on 01/23/2020 with left grade III invasive ductal carcinoma breast cancer. It is ER positive, PR negative, and HER2 negative , but functionally Triple Negative,with a Ki67 of 40%. Patient with interpreter present. She underwent neoadjuvant chemotherapy from 02/12/2020 - 05/28/2020. 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 She underwent a left lumpectomy and sentinel node biopsy ( 4 negative nodes) on 07/15/2020. She underwent radiation and anti-estrogen therapy.  She had interval development of diffuse skin thickening in 2023. She had a punch biopsy of her skin at that time and this was consistent with inflammatory breast cancer. She has been treated with chemo since then. She had a left modified radical mastectomy on 08/22/2022 with complete therapeutic response and 0/1 LN. She noted some swelling in her left arm and on 08/25/2022 she had an [place] that was negative for DVT. Her SOZO screen on 09/19/2021 was 6.9 points above baseline putting her in the yellow zone and she was given a compression sleeve.  PAIN:  Are you having pain? No feeling better since bandaging has gotten arm smaller  PRECAUTIONS: Other: lymphedema [name]  WEIGHT BEARING RESTRICTIONS: No  FALLS:  Has patient fallen in last 6 months? No  LIVING ENVIRONMENT: Lives with:  daughters aged 38, 58 and 23 Lives in:  House/apartment Stairs: No;  Has following equipment at home: None  OCCUPATION: not currently working, since Feb 2021 stopped working due to number of appts, plans to return to work; previously worked Dentist at [employer] but will plan to return to some sort of [place] work  LEISURE: pt does not exercise  HAND DOMINANCE: right   PRIOR LEVEL OF FUNCTION: Independent  PATIENT GOALS: get the swelling down, improve L shoulder ROM, decrease pain   OBJECTIVE:  COGNITION: Overall cognitive status: Within functional limits for tasks assessed   PALPATION: Decreased scar mobility  OBSERVATIONS / OTHER ASSESSMENTS: scar well healed, increased edema at lateral trunk and throughout [name]  POSTURE: forward head, rounded shoulders  UPPER EXTREMITY AROM/PROM:  A/PROM RIGHT   eval   Shoulder extension 68  Shoulder flexion 160  Shoulder abduction 175  Shoulder internal rotation 56  Shoulder external rotation 88    (Blank rows = not tested)  A/PROM LEFT   eval LEFT 11/10/22  Shoulder extension 52   Shoulder flexion 146 150  Shoulder abduction 148 160  Shoulder internal rotation 67   Shoulder external rotation 86     (Blank rows = not tested)   LYMPHEDEMA ASSESSMENTS:  SURGERY TYPE/DATE: Left Lumpectomy with SLNB11/11/2019, Left modified mastectomy with left axillary node dissection NUMBER OF LYMPH NODES REMOVED: 0/4 with lumpectomy, 0/1 during Mastectomy CHEMOTHERAPY: yes RADIATION:Yes HORMONE TREATMENT: may require INFECTIONS: none  LYMPHEDEMA ASSESSMENTS:  LANDMARK RIGHT  eval RIGHT 11/08/22  10 cm proximal to olecranon process 31.4 34.4  Olecranon process 27.'2 28  10 '$ cm proximal to ulnar styloid process 25.7 25.5  Just proximal to ulnar styloid process 18.4 19  Across hand at thumb web space 20 20  At base of 2nd digit 6.4 6.5  (Blank rows = not tested)  LANDMARK LEFT  eval LEFT 10/25/22 Left  11/03/22 LEFT 11/08/22  10 cm proximal to olecranon process  33 33 31.9 33  Olecranon process 28.4 28.5 28.'5 29  10 '$ cm proximal to ulnar styloid process 26 26.5 26.4 26.6  Just proximal to ulnar styloid process 19.4 19 18.9 19.1  Across hand at thumb web space 21 20.5 19.7 20.7  2nd digit    6.7   TODAY'S TREATMENT:   11/17/2022 Removed bandage - pt washed arm - some redness at the elbow due to irritation.  Pts chest is very red with some wet desquamation and one spot that has some cloudy fluid at the lateral edge of the incision and one at the medial side.  Pt will let rad know tomorrow to see about what to put on.  MLD: Short neck, superficial and deep abdominals, Lt axillo-inguinal and anterior inter-axillary anastomosis avoiding radiation field, then to Rt s/l to work on L lateral trunk where pt has increased edema then retracing all steps  Compression Bandaging: Cocoa butter, TG soft from hand to axilla,  mollelast to digits 1-4 with  , gray foam over dorsum of hand and wrist, artiflex from hand to axilla, 1 6 cm bandage at hand, 1 8 cm bandage from wrist to axilla with x at antecubital fossa and 1 12 cm bandage from wrist to axilla reviewing correct spacing and tension while performing.   11/15/2022 Pulleys x 1:30 flexion, scaption, abd Wall slides x 5 Discussed purchasing pulleys for home use and showed pulleys on [employer] MLD: Short neck, superficial and deep abdominals, Lt inguinal and Rt axillary nodes, Lt axillo-inguinal and anterior inter-axillary anastomosis, then to R s/l to work on L lateral trunk where pt has increased edema then retracing all steps  Compression Bandaging: Cocoa butter, TG soft from hand to axilla,  mollelast to digits 1-4 with  , gray foam over dorsum of hand and wrist, artiflex from hand to axilla, 1 6 cm bandage  at hand, 1 8 cm bandage from wrist to axilla with x at antecubital fossa and 1 12 cm bandage from wrist to axilla reviewing correct spacing and tension while performing. Pt educated to wash bandages next time she has  sleeve on and instructed how to wash and dry. Pt with questions through interpreter about if she would have to wear the sleeve forever and use the night garment also.  When told yes she did get a little teary but recovered quickly.  11/10/22 11/03/22: Manual Therapy PROM to L shoulder in supine: in to flexion and abduction with increased tightness noted in axilla MLD: Short neck, superficial and deep abdominals, Lt inguinal and Rt axillary nodes, Lt axillo-inguinal and anterior inter-axillary anastomosis, then to R s/l to work on L lateral trunk where pt has increased edema then retracing all steps  Compression Bandaging: Cocoa butter, TG soft from hand to axilla,  mollelast to digits 1-4 with therapist performing first 2 digits and pt completing last 2 , artiflex from hand to axilla, 1 6 cm bandage at hand (reviewing technique while performing) with 1/2" gray foam to dorsal aspect that covered past wrist (pt was instructed she can try this but does not have to use, but please bring back), 1 8 cm bandage from wrist to axilla with x at antecubital fossa and 1 12 cm bandage from wrist to axilla reviewing correct spacing and tension while performing.                                                                                                                                        PATIENT EDUCATION:  Education details: lymphedema, anatomy and physiology of the lymphatic system, how lymphedema is treated, subclinical lymphedema vs lymphedema, need for compression bra Person educated: Patient Education method: Explanation Education comprehension: verbalized understanding  HOME EXERCISE PROGRAM: Obtain compression bra, continue to wear sleeve and glove  ASSESSMENT:  CLINICAL IMPRESSION: Held on stretches due to wet desquamation and redness.  Continued with CDT and bandaging.   OBJECTIVE IMPAIRMENTS: decreased knowledge of condition, decreased ROM, decreased strength, increased edema, increased  fascial restrictions, impaired UE functional use, postural dysfunction, and pain.   ACTIVITY LIMITATIONS: carrying, lifting, and reach over head  PARTICIPATION LIMITATIONS: cleaning, laundry, community activity, occupation, and yard work  PERSONAL FACTORS: Time since onset of injury/illness/exacerbation are also affecting patient's functional outcome.   REHAB POTENTIAL: Good  CLINICAL DECISION MAKING: Stable/uncomplicated  EVALUATION COMPLEXITY: Low  GOALS: Goals reviewed with patient? Yes   SHORT TERM GOALS = LONG TERM GOALS: Target date: 11/09/22  Pt will be independent in self MLD for long term management of lymphedema.  Baseline:  Goal status: IN PROGRESS 11/10/22- pt has been practicing but does not feel completely independent  2.  Pt will obtain appropriate compression garments for long term management of lymphedema.  Baseline:  Goal status: IN PROGRESS  3.  Pt will  demonstrate 160 degrees of L shoulder flexion to allow her to reach overhead. Baseline:  Goal status: IN PROGRESS 11/10/22 - 150  4. Pt will demonstrate 170 degrees of L shoulder abduction to allow her to reach out to the side.   Goal: IN PROGRESS 11/10/22 - 160  5. Pt will be independent in a home exercise program for continued stretching and strengthening.  Goal: IN PROGRESS 11/10/22  6. Pt will report she is able to raise her L arm to end range wihtout increased pain in her axilla to allow improved comfort.   GOAL: IN PROGRESS - pt still having pain when L UE ROM 11/10/22   PLAN:  PT FREQUENCY: 2x/week  PT DURATION: 4 weeks  PLANNED INTERVENTIONS: Therapeutic exercises, Therapeutic activity, Patient/Family education, Self Care, Joint mobilization, Manual lymph drainage, Compression bandaging, scar mobilization, Taping, Vasopneumatic device, and Manual therapy  PLAN FOR NEXT SESSION:pt to be measured on 11/23/22 by cassidy for custom day/night garments; cont MLD to L UE and lateral trunk and instructing  pt, PROM for L UE and STM to pect tendon  Stark Bray, PT 11/17/2022, 12:53 PM

## 2022-11-18 ENCOUNTER — Other Ambulatory Visit: Payer: Self-pay

## 2022-11-18 ENCOUNTER — Ambulatory Visit
Admission: RE | Admit: 2022-11-18 | Discharge: 2022-11-18 | Disposition: A | Discharge status: Home / Self Care | Payer: No Typology Code available for payment source | Source: Ambulatory Visit | Attending: Radiation Oncology | Admitting: Radiation Oncology

## 2022-11-18 LAB — RAD ONC ARIA SESSION SUMMARY
Course Elapsed Days: 30
Plan Fractions Treated to Date: 12
Plan Prescribed Dose Per Fraction: 1.8 Gy
Plan Total Fractions Prescribed: 14
Plan Total Prescribed Dose: 25.2 Gy
Reference Point Dosage Given to Date: 21.6 Gy
Reference Point Session Dosage Given: 1.8 Gy
Session Number: 23

## 2022-11-21 ENCOUNTER — Ambulatory Visit
Admission: RE | Admit: 2022-11-21 | Discharge: 2022-11-21 | Disposition: A | Discharge status: Home / Self Care | Payer: No Typology Code available for payment source | Source: Ambulatory Visit | Attending: Radiation Oncology

## 2022-11-21 ENCOUNTER — Other Ambulatory Visit: Payer: Self-pay

## 2022-11-21 LAB — RAD ONC ARIA SESSION SUMMARY
Course Elapsed Days: 33
Plan Fractions Treated to Date: 12
Plan Prescribed Dose Per Fraction: 1.8 Gy
Plan Total Fractions Prescribed: 14
Plan Total Prescribed Dose: 25.2 Gy
Reference Point Dosage Given to Date: 21.6 Gy
Reference Point Session Dosage Given: 1.8 Gy
Session Number: 24

## 2022-11-22 ENCOUNTER — Other Ambulatory Visit: Payer: Self-pay

## 2022-11-22 ENCOUNTER — Encounter: Payer: Self-pay | Admitting: Rehabilitation

## 2022-11-22 ENCOUNTER — Ambulatory Visit
Admission: RE | Admit: 2022-11-22 | Discharge: 2022-11-22 | Disposition: A | Discharge status: Home / Self Care | Payer: No Typology Code available for payment source | Source: Ambulatory Visit | Attending: Radiation Oncology

## 2022-11-22 ENCOUNTER — Ambulatory Visit: Payer: No Typology Code available for payment source | Admitting: Rehabilitation

## 2022-11-22 DIAGNOSIS — M25612 Stiffness of left shoulder, not elsewhere classified: Secondary | ICD-10-CM

## 2022-11-22 DIAGNOSIS — I972 Postmastectomy lymphedema syndrome: Secondary | ICD-10-CM

## 2022-11-22 DIAGNOSIS — Z483 Aftercare following surgery for neoplasm: Secondary | ICD-10-CM

## 2022-11-22 DIAGNOSIS — Z170 Estrogen receptor positive status [ER+]: Secondary | ICD-10-CM

## 2022-11-22 DIAGNOSIS — R293 Abnormal posture: Secondary | ICD-10-CM

## 2022-11-22 LAB — RAD ONC ARIA SESSION SUMMARY
Course Elapsed Days: 34
Plan Fractions Treated to Date: 13
Plan Prescribed Dose Per Fraction: 1.8 Gy
Plan Total Fractions Prescribed: 14
Plan Total Prescribed Dose: 25.2 Gy
Reference Point Dosage Given to Date: 23.4 Gy
Reference Point Session Dosage Given: 1.8 Gy
Session Number: 25

## 2022-11-22 NOTE — Therapy (Signed)
OUTPATIENT PHYSICAL THERAPY ONCOLOGY TREATMENT  Patient Name: [name] MRN: [id number] DOB:[birth year], 47 y.o., female Today's Date: 11/22/2022  END OF SESSION:  PT End of Session - 11/22/22 1209     Visit Number 13    Number of Visits 18    Date for PT Re-Evaluation 12/08/22    PT Start Time 1108    PT Stop Time 1200    PT Time Calculation (min) 52 min    Activity Tolerance Patient tolerated treatment well    Behavior During Therapy [facility] for tasks assessed/performed                  Past Medical History:  Diagnosis Date   Allergy    Asthma    Breast cancer in female [facility])    Left   Breast disorder    breast cancer May 2021   Chronic back pain    Headache    High triglycerides    History of breast cancer    History of COVID-19 04/20/2021   Hx of migraines    Morbid obesity ([place])    Personal history of chemotherapy    Personal history of radiation therapy    Past Surgical History:  Procedure Laterality Date   BREAST LUMPECTOMY     BREAST LUMPECTOMY WITH RADIOACTIVE SEED AND SENTINEL LYMPH NODE BIOPSY Left 07/15/2020   Procedure: LEFT BREAST LUMPECTOMY WITH RADIOACTIVE SEED AND LEFT AXILLARY SENTINEL LYMPH NODE BIOPSY, LEFT AXILLARY NODE RADIOACTIVE SEED GUIDED EXCISION, LEFT BREAST RADIOACTIVE SEED GUIDED EXCISION IM NODE;  Surgeon: Rolm Bookbinder, MD;  Location: [place];  Service: General;  Laterality: Left;  PEC BLOCK   CESAREAN SECTION WITH BILATERAL TUBAL LIGATION Bilateral 07/23/2015   Procedure: CESAREAN SECTION WITH BILATERAL TUBAL LIGATION;  Surgeon: Donnamae Jude, MD;  Location: [place] [facility];  Service: Obstetrics;  Laterality: Bilateral;   IR IMAGING GUIDED PORT INSERTION  02/11/2022   MODIFIED MASTECTOMY Left 08/22/2022   Procedure: LEFT MODIFIED RADICAL MASTECTOMY, LEFT AXILLARY NODE DISSECTION, AND SKIN BIOPSY OF BACK;  Surgeon: Rolm Bookbinder, MD;  Location: [facility];  Service: General;  Laterality: Left;   PORT-A-CATH REMOVAL N/A  04/30/2021   Procedure: REMOVAL PORT-A-CATH;  Surgeon: Rolm Bookbinder, MD;  Location: [place];  Service: General;  Laterality: N/A;   PORTACATH PLACEMENT Right 02/11/2020   Procedure: INSERTION PORT-A-CATH WITH ULTRASOUND GUIDANCE;  Surgeon: Rolm Bookbinder, MD;  Location: [place];  Service: General;  Laterality: Right;   TUBAL LIGATION     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[place]) 04/01/2022   Encounter for dental examination 03/11/2022   Acute apical periodontitis 03/11/2022   Phobia of dental procedure 03/11/2022   Defective dental restoration 03/11/2022   Accretions on teeth 03/11/2022   Chronic periodontitis 03/11/2022   Teeth missing 03/11/2022   Caries 03/11/2022   Generalized gingival recession 03/11/2022   Pain, dental 03/08/2022   Cancer-related pain 03/08/2022   Port-A-Cath in place 02/15/2022   Oral allergy syndrome, subsequent encounter 02/03/2022   Adverse effect of other drugs, medicaments and biological substances, subsequent encounter 02/02/2022   Other adverse food reactions, not elsewhere classified, subsequent encounter 02/02/2022   Chest tightness 02/02/2022   Other allergic rhinitis 02/02/2022   Allergic conjunctivitis of  both eyes 02/02/2022   Language barrier 12/11/2020   Morbid obesity ([place])    History of breast cancer    Genetic testing 02/13/2020   Malignant neoplasm of upper-outer quadrant of left breast in female, estrogen receptor positive ([place]) 01/27/2020   Impaired ability to use community resources due to language barrier 07/13/2013    PCP: Lazaro Arms, NP  REFERRING PROVIDER: Benay Pike, MD  REFERRING DIAG: Left UE lymphedema  THERAPY DIAG:   Postmastectomy lymphedema  Stiffness of left shoulder, not elsewhere classified  Aftercare following surgery for neoplasm  Abnormal posture  Malignant neoplasm of upper-outer quadrant of left breast in female, estrogen receptor positive ([place])  ONSET DATE: 08/01/22  Rationale for Evaluation and Treatment: Rehabilitation  SUBJECTIVE:                                                                                                                                                                                           SUBJECTIVE STATEMENT:  Pt comes in reporting she feels like her is beating fast and doesn't feel well.  See treatment section  PERTINENT HISTORY:  Patient was diagnosed on 01/23/2020 with left grade III invasive ductal carcinoma breast cancer. It is ER positive, PR negative, and HER2 negative , but functionally Triple Negative,with a Ki67 of 40%. Patient with interpreter present. She underwent neoadjuvant chemotherapy from 02/12/2020 - 05/28/2020. 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 She underwent a left lumpectomy and sentinel node biopsy ( 4 negative nodes) on 07/15/2020. She underwent radiation and anti-estrogen therapy.  She had interval development of diffuse skin thickening in 2023. She had a punch biopsy of her skin at that time and this was consistent with inflammatory breast cancer. She has been treated with chemo since then. She had a left modified radical mastectomy on 08/22/2022 with complete therapeutic response and 0/1 LN. She noted some swelling in her left arm and on 08/25/2022 she had an [place] that was negative for DVT. Her SOZO screen on 09/19/2021 was 6.9 points above baseline putting her in the yellow zone and she was given a compression sleeve.  PAIN:  Are you having pain? No feeling better since bandaging has gotten arm smaller  PRECAUTIONS: Other: lymphedema [name]  WEIGHT BEARING RESTRICTIONS: No  FALLS:  Has patient fallen in last 6  months? 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  LEISURE: pt does not exercise  HAND DOMINANCE: right   PRIOR LEVEL OF FUNCTION: Independent  PATIENT GOALS: get the swelling down, improve L shoulder ROM, decrease pain   OBJECTIVE:  COGNITION: Overall cognitive status: Within functional limits for tasks assessed   PALPATION: Decreased scar mobility  OBSERVATIONS / OTHER ASSESSMENTS: scar well healed, increased edema at lateral trunk and throughout [name]  POSTURE: forward head, rounded shoulders  UPPER EXTREMITY AROM/PROM:  A/PROM RIGHT   eval   Shoulder extension 68  Shoulder flexion 160  Shoulder abduction 175  Shoulder internal rotation 56  Shoulder external rotation 88    (Blank rows = not tested)  A/PROM LEFT   eval LEFT 11/10/22  Shoulder extension 52   Shoulder flexion 146 150  Shoulder abduction 148 160  Shoulder internal rotation 67   Shoulder external rotation 86     (Blank rows = not tested)   LYMPHEDEMA ASSESSMENTS:  SURGERY TYPE/DATE: Left Lumpectomy with SLNB11/11/2019, Left modified mastectomy with left axillary node dissection NUMBER OF LYMPH NODES REMOVED: 0/4 with lumpectomy, 0/1 during Mastectomy CHEMOTHERAPY: yes RADIATION:Yes HORMONE TREATMENT: may require INFECTIONS: none  LYMPHEDEMA ASSESSMENTS:  LANDMARK RIGHT  eval RIGHT 11/08/22  10 cm proximal to olecranon process 31.4 34.4  Olecranon process 27.'2 28  10 '$ cm proximal to ulnar styloid process 25.7 25.5  Just proximal to ulnar styloid process 18.4 19  Across hand at thumb web space 20 20  At base of 2nd digit 6.4 6.5  (Blank rows = not tested)  LANDMARK LEFT   eval LEFT 10/25/22 Left  11/03/22 LEFT 11/08/22  10 cm proximal to olecranon process 33 33 31.9 33  Olecranon process 28.4 28.5 28.'5 29  10 '$ cm proximal to ulnar styloid process 26 26.5 26.4 26.6  Just proximal to ulnar styloid process 19.4 19 18.9 19.1  Across hand at thumb web space 21 20.5 19.7 20.7  2nd digit    6.7   TODAY'S TREATMENT:   11/22/2022 Resting HR 105 upon arrival.  BP normal for patient.  Resting HR looks to be around 80-85 for other visits previously.  Discussion regarding morning so far and pt said she was on the radiation table longer than normal as they got xrays and she felt increased pain and stress the whole time. This could have contributed to HR elevation.  Post treatment pt was back to 80 and she felt better.  Checked and monitored status around 32mn.  MLD: Short neck, superficial and deep abdominals, Lt axillo-inguinal and anterior inter-axillary anastomosis avoiding radiation field, then to Rt s/l to work on L lateral trunk where pt has increased edema then retracing all steps  Compression Bandaging: Cocoa butter, TG soft from hand to axilla,  mollelast to digits 1-4 with  , gray foam over dorsum of hand and wrist, artiflex from hand to axilla, 1 6 cm bandage at hand, 1 8 cm bandage from wrist to axilla with x at antecubital fossa and 1 12 cm bandage from wrist to axilla reviewing correct spacing and tension while performing.   11/17/2022 Removed bandage - pt washed arm - some redness at the elbow due to irritation.  Pts chest is very red with some wet desquamation and one spot that has some cloudy fluid at the lateral edge of the incision and one at the medial side.  Pt will let rad know tomorrow to see about what to put on.  MLD:  Short neck, superficial and deep abdominals, Lt axillo-inguinal and anterior inter-axillary anastomosis avoiding radiation field, then to Rt s/l to work on L lateral trunk where pt has increased edema then retracing all steps  Compression  Bandaging: Cocoa butter, TG soft from hand to axilla,  mollelast to digits 1-4 with  , gray foam over dorsum of hand and wrist, artiflex from hand to axilla, 1 6 cm bandage at hand, 1 8 cm bandage from wrist to axilla with x at antecubital fossa and 1 12 cm bandage from wrist to axilla reviewing correct spacing and tension while performing.   11/15/2022 Pulleys x 1:30 flexion, scaption, abd Wall slides x 5 Discussed purchasing pulleys for home use and showed pulleys on [employer] MLD: Short neck, superficial and deep abdominals, Lt inguinal and Rt axillary nodes, Lt axillo-inguinal and anterior inter-axillary anastomosis, then to R s/l to work on L lateral trunk where pt has increased edema then retracing all steps  Compression Bandaging: Cocoa butter, TG soft from hand to axilla,  mollelast to digits 1-4 with  , gray foam over dorsum of hand and wrist, artiflex from hand to axilla, 1 6 cm bandage at hand, 1 8 cm bandage from wrist to axilla with x at antecubital fossa and 1 12 cm bandage from wrist to axilla reviewing correct spacing and tension while performing. Pt educated to wash bandages next time she has sleeve on and instructed how to wash and dry. Pt with questions through interpreter about if she would have to wear the sleeve forever and use the night garment also.  When told yes she did get a little teary but recovered quickly.                                                                                                                                     PATIENT EDUCATION:  Education details: lymphedema, anatomy and physiology of the lymphatic system, how lymphedema is treated, subclinical lymphedema vs lymphedema, need for compression bra Person educated: Patient Education method: Explanation Education comprehension: verbalized understanding  HOME EXERCISE PROGRAM: Obtain compression bra, continue to wear sleeve and glove  ASSESSMENT:  CLINICAL IMPRESSION: Held on stretches due to wet  desquamation and redness.  Continued with CDT and bandaging. Pt felt better upon leaving after presenting with higher HR.  BP is normal.   OBJECTIVE IMPAIRMENTS: decreased knowledge of condition, decreased ROM, decreased strength, increased edema, increased fascial restrictions, impaired UE functional use, postural dysfunction, and pain.   ACTIVITY LIMITATIONS: carrying, lifting, and reach over head  PARTICIPATION LIMITATIONS: cleaning, laundry, community activity, occupation, and yard work  PERSONAL FACTORS: Time since onset of injury/illness/exacerbation are also affecting patient's functional outcome.   REHAB POTENTIAL: Good  CLINICAL DECISION MAKING: Stable/uncomplicated  EVALUATION COMPLEXITY: Low  GOALS: Goals reviewed with patient? Yes   SHORT TERM GOALS = LONG TERM GOALS: Target date: 11/09/22  Pt will be independent in self MLD for  long term management of lymphedema.  Baseline:  Goal status: IN PROGRESS 11/10/22- pt has been practicing but does not feel completely independent  2.  Pt will obtain appropriate compression garments for long term management of lymphedema.  Baseline:  Goal status: IN PROGRESS  3.  Pt will demonstrate 160 degrees of L shoulder flexion to allow her to reach overhead. Baseline:  Goal status: IN PROGRESS 11/10/22 - 150  4. Pt will demonstrate 170 degrees of L shoulder abduction to allow her to reach out to the side.   Goal: IN PROGRESS 11/10/22 - 160  5. Pt will be independent in a home exercise program for continued stretching and strengthening.  Goal: IN PROGRESS 11/10/22  6. Pt will report she is able to raise her L arm to end range wihtout increased pain in her axilla to allow improved comfort.   GOAL: IN PROGRESS - pt still having pain when L UE ROM 11/10/22   PLAN:  PT FREQUENCY: 2x/week  PT DURATION: 4 weeks  PLANNED INTERVENTIONS: Therapeutic exercises, Therapeutic activity, Patient/Family education, Self Care, Joint mobilization,  Manual lymph drainage, Compression bandaging, scar mobilization, Taping, Vasopneumatic device, and Manual therapy  PLAN FOR NEXT SESSION:pt to be measured on 11/23/22 by cassidy for custom day/night garments; cont MLD to L UE and lateral trunk and instructing pt, PROM for L UE and STM to pect tendon  Stark Bray, PT 11/22/2022, 12:11 PM

## 2022-11-23 ENCOUNTER — Other Ambulatory Visit: Payer: Self-pay

## 2022-11-23 ENCOUNTER — Ambulatory Visit
Admission: RE | Admit: 2022-11-23 | Discharge: 2022-11-23 | Disposition: A | Discharge status: Home / Self Care | Payer: No Typology Code available for payment source | Source: Ambulatory Visit | Attending: Radiation Oncology

## 2022-11-23 ENCOUNTER — Ambulatory Visit
Admission: RE | Admit: 2022-11-23 | Discharge: 2022-11-23 | Discharge status: Home / Self Care | Payer: No Typology Code available for payment source | Source: Ambulatory Visit | Attending: Radiation Oncology

## 2022-11-23 DIAGNOSIS — C50412 Malignant neoplasm of upper-outer quadrant of left female breast: Secondary | ICD-10-CM

## 2022-11-23 LAB — RAD ONC ARIA SESSION SUMMARY
Course Elapsed Days: 35
Plan Fractions Treated to Date: 13
Plan Prescribed Dose Per Fraction: 1.8 Gy
Plan Total Fractions Prescribed: 14
Plan Total Prescribed Dose: 25.2 Gy
Reference Point Dosage Given to Date: 23.4 Gy
Reference Point Session Dosage Given: 1.8 Gy
Session Number: 26

## 2022-11-23 MED ORDER — SILVER SULFADIAZINE 1 % EX CREA: TOPICAL_CREAM | Freq: Every day | CUTANEOUS | Status: DC

## 2022-11-23 NOTE — Assessment & Plan Note (Addendum)
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  She was most recently seen for ongoing breast redness, given a course of antibiotics but had persistent redness hence we proceeded with a punch biopsy.  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  Pathology was sent to [facility] for second opinion as well. This confirmed triple negative breast cancer.  She progressed on after 2 cycles of TC hence we have switched her treatment to Enhertu.   Repeat biopsy demonstrated her HER2 to be 1+.  Given locally advanced and unresectable breast cancer and the progression on TC and previous exposure to Adriamycin based regimen, we decided to try Enhertu for HER2 low disease and she had robust response.  She is now status post left mastectomy and axillary resection and there is no evidence of residual tumor, complete pathologic response obtained.  Since this is a recurrent breast cancer and high risk for systemic disease, I have discussed about adjuvant Xeloda based on the create X trial I will start her on adjuvant Xeloda after completion of radiation.  Once again we discussed the excellent response achieved so far, role of antiestrogen therapy like tamoxifen as well as adjuvant chemotherapy leg Xeloda.  She is she is now here after radiation and will start on adjuvant Xeloda at 1000 mg/m (1250 mg/m was original dose used and create X) twice daily 2 weeks on 1 week off for 6-8 cycles.  She will start on antiestrogen therapy after completing adjuvant Xeloda.  I once again discussed about adverse effects of Xeloda including but not limited to fatigue, nausea, vomiting, diarrhea, hand-foot syndrome, coronary vasospasm, severe toxicity in patients with DPD deficiency using a certified spanish interpretor.  She will start xeloda in 2 weeks.

## 2022-11-23 NOTE — Progress Notes (Signed)
[name]:    Monica Foy, NP [address] [place] [address]   DIAGNOSIS:  Cancer Staging  Malignant neoplasm of upper-outer quadrant of left breast in female, estrogen receptor positive ([place]) Staging form: Breast, AJCC 8th Edition - Clinical stage from 01/29/2020: Stage IIIA (cT2, cN1, cM0, G3, ER+, PR-, HER2-) - Signed by Benay Pike, MD on 06/28/2022 Stage prefix: Initial diagnosis Histologic grading system: 3 grade system - Pathologic stage from 07/15/2020: No Stage Recommended (ypT1b, pN0, cM0, G3, ER-, PR-, HER2-) - Signed by Gardenia Phlegm, NP on 04/01/2022 Stage prefix: Post-therapy Histologic grading system: 3 grade system   SUMMARY OF ONCOLOGIC HISTORY: Oncology History  Malignant neoplasm of upper-outer quadrant of left breast in female, estrogen receptor positive ([name])  01/23/2020 Initial Diagnosis   [place] woman status post left breast upper outer quadrant biopsy 01/23/2020 for a clinical T3 N1, 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             (a) chest CT scan and bone scan 02/07/2020 showed no evidence of metastatic disease   01/29/2020 Cancer Staging   Staging form: Breast, AJCC 8th Edition - Clinical stage from 01/29/2020: Stage IIIA (cT2, cN1, cM0, G3, ER+, PR-, HER2-) - Signed by Benay Pike, MD on 06/28/2022 Stage prefix: Initial diagnosis Histologic grading system: 3 grade system   02/07/2020 Genetic Testing   Negative genetic testing. No pathogenic variants identified on the [facility] Common Hereditary Cancers Panel. VUS in MSH6 called c.2744C>G identified. The report date is 02/07/2020.  The Common Hereditary Cancers Panel offered by [facility] includes sequencing and/or deletion duplication testing of the following 48 genes: APC, ATM, AXIN2, BARD1, BMPR1A, BRCA1, BRCA2, BRIP1, CDH1, CDKN2A (p14ARF), CDKN2A (p16INK4a), CKD4, CHEK2, CTNNA1,  DICER1, EPCAM (Deletion/duplication testing only), GREM1 (promoter region deletion/duplication testing only), KIT, MEN1, MLH1, MSH2, MSH3, MSH6, MUTYH, NBN, NF1, NHTL1, PALB2, PDGFRA, PMS2, POLD1, POLE, PTEN, RAD50, RAD51C, RAD51D, RNF43, SDHB, SDHC, SDHD, SMAD4, SMARCA4. STK11, TP53, TSC1, TSC2, and VHL.  The following genes were evaluated for sequence changes only: SDHA and HOXB13 c.251G>A variant only.   02/12/2020 - 05/28/2020 Neo-Adjuvant Chemotherapy   neoadjuvant chemotherapy consisting of doxorubicin and cyclophosphamide in dose dense fashion x4 started 02/12/2020, completed 03/25/2020, followed by paclitaxel and carboplatin weekly x12 started 04/08/2020 and completed on 05/28/2020             (a) echo 02/07/2020 shows an ejection fraction in the 55-60% range             (b) Epirubicin substituted for Doxorubicin starting with cycle 2 of AC secondary to allergic rash from Doxorubicin   07/15/2020 Surgery   status post left lumpectomy and sentinel lymph node sampling 07/15/2020 for a ypT1b ypN0 residual invasive ductal carcinoma, grade 3, with negative margins.             (a) a total of 4 left axillary lymph nodes were removed             (b) repeat prognostic panel on the final pathology again triple negative, with an MIB-1 of 50%.   07/15/2020 Cancer Staging   Staging form: Breast, AJCC 8th Edition - Pathologic stage from 07/15/2020: No Stage Recommended (ypT1b, pN0, cM0, G3, ER-, PR-, HER2-) - Signed by Gardenia Phlegm, NP on 04/01/2022 Stage prefix: Post-therapy Histologic grading system: 3 grade system   08/26/2020 - 10/09/2020 Radiation Therapy   adjuvant radiation  completed 10/09/2020             (a) sensitizing capecitabine attempted but not tolerated by the patient  08/26/2020 through 10/09/2020 Site Technique Total Dose (Gy) Dose per Fx (Gy) Completed Fx Beam Energies  Breast, Left: Breast_Lt 3D 50/50 2 25/25 15X  Breast, Left: Breast_Lt_PAB_SCV 3D 50/50 2 25/25 10X, 15X   Breast, Left: Breast_Lt_Bst 3D 10/10 2 5/5 6X, 10X    11/23/2020 - 01/04/2021 Adjuvant Chemotherapy   adjuvant pembrolizumab started 11/23/2020, to continue for 1 year             (a) discontinued after 01/04/2021 dose with multiple side effects requiring steroids for resolution   03/30/2022 Mammogram   Mammogram and ultrasound show numerous masses throughout left breast not visualized in 01/2022, +left axillary lymphadenopathy, skin thickening in right breast and in upper abdomen just below sternum   04/05/2022 -  Chemotherapy   Patient is on Treatment Plan : BREAST METASTATIC Fam-Trastuzumab Deruxtecan-nxki (Enhertu) (5.4) q21d     04/06/2022 - 04/26/2022 Chemotherapy   Patient is on Treatment Plan : BREAST METASTATIC fam-trastuzumab deruxtecan-nxki (Enhertu) q21d     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  Prognostic panel sent to [facility]: Left breast skin, punch biopsy: Skin with dermal lymphatic involvement by carcinoma (confirmed with outside CK7 IHC, reviewed). Biomarkers are reported as negative for ER, PR, Her2/neu (confirmed on review but please note sample size is extremely small).   02/15/2022 - 03/08/2022 Chemotherapy    Taxotere/Cytoxan x 2, discontinued due to progression of breast.   03/30/2022 Mammogram   Mammogram and ultrasound show numerous masses throughout left breast not visualized in 01/2022, +left axillary lymphadenopathy, skin thickening in right breast and in upper abdomen just below sternum   04/05/2022 -  Chemotherapy   Patient is on Treatment Plan : BREAST METASTATIC Fam-Trastuzumab Deruxtecan-nxki (Enhertu) (5.4) q21d     04/06/2022 - 04/26/2022 Chemotherapy   Patient is on Treatment Plan : BREAST METASTATIC fam-trastuzumab deruxtecan-nxki (Enhertu) q21d      INTERVAL HISTORY:  [name] 47 y.o. female returns for follow-Thomas of her breast cancer.   She is now  post surgery, had left mastectomy, lymph axillary excision.  Her most recent surgery did not show any further evidence of disease after Enhertu.   However given her very high risk of recurrence, we have discussed about considering adjuvant treatment with Xeloda  She got her last radiation dose today. She is feeling very tired. She is very motivated to try the xeloda.  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) 04/01/2022   Encounter for dental examination 03/11/2022   Acute apical periodontitis 03/11/2022   Phobia of dental procedure 03/11/2022   Defective dental restoration 03/11/2022   Accretions on teeth 03/11/2022   Chronic periodontitis 03/11/2022   Teeth missing 03/11/2022   Caries 03/11/2022   Generalized gingival recession 03/11/2022   Pain, dental 03/08/2022   Cancer-related pain 03/08/2022   Port-A-Cath in place 02/15/2022   Oral allergy syndrome, subsequent encounter 02/03/2022   Adverse effect of other drugs, medicaments and biological substances, subsequent encounter 02/02/2022   Other adverse food reactions, not elsewhere classified, subsequent encounter 02/02/2022  Chest tightness 02/02/2022   Other allergic rhinitis 02/02/2022   Allergic conjunctivitis of both eyes 02/02/2022   Language barrier 12/11/2020   Morbid obesity ([place])    History of breast cancer    Genetic testing 02/13/2020   Malignant neoplasm of upper-outer quadrant of left breast in female, estrogen receptor positive ([place]) 01/27/2020   Impaired ability to use community resources due to language barrier 07/13/2013    is allergic to omnipaque [iohexol], doxorubicin hcl, keytruda [pembrolizumab], other, and tape.  MEDICAL HISTORY: Past Medical History:  Diagnosis  Date   Allergy    Asthma    Breast cancer in female [facility])    Left   Breast disorder    breast cancer May 2021   Chronic back pain    Headache    High triglycerides    History of breast cancer    History of COVID-19 04/20/2021   Hx of migraines    Morbid obesity ([place])    Personal history of chemotherapy    Personal history of radiation therapy     SURGICAL HISTORY: Past Surgical History:  Procedure Laterality Date   BREAST LUMPECTOMY     BREAST LUMPECTOMY WITH RADIOACTIVE SEED AND SENTINEL LYMPH NODE BIOPSY Left 07/15/2020   Procedure: LEFT BREAST LUMPECTOMY WITH RADIOACTIVE SEED AND LEFT AXILLARY SENTINEL LYMPH NODE BIOPSY, LEFT AXILLARY NODE RADIOACTIVE SEED GUIDED EXCISION, LEFT BREAST RADIOACTIVE SEED GUIDED EXCISION IM NODE;  Surgeon: Rolm Bookbinder, MD;  Location: [facility];  Service: General;  Laterality: Left;  PEC BLOCK   CESAREAN SECTION WITH BILATERAL TUBAL LIGATION Bilateral 07/23/2015   Procedure: CESAREAN SECTION WITH BILATERAL TUBAL LIGATION;  Surgeon: Donnamae Jude, MD;  Location: [facility];  Service: Obstetrics;  Laterality: Bilateral;   IR IMAGING GUIDED PORT INSERTION  02/11/2022   MODIFIED MASTECTOMY Left 08/22/2022   Procedure: LEFT MODIFIED RADICAL MASTECTOMY, LEFT AXILLARY NODE DISSECTION, AND SKIN BIOPSY OF BACK;  Surgeon: Rolm Bookbinder, MD;  Location: [facility];  Service: General;  Laterality: Left;   PORT-A-CATH REMOVAL N/A 04/30/2021   Procedure: REMOVAL PORT-A-CATH;  Surgeon: Rolm Bookbinder, MD;  Location: [facility];  Service: General;  Laterality: N/A;   PORTACATH PLACEMENT Right 02/11/2020   Procedure: INSERTION PORT-A-CATH WITH ULTRASOUND GUIDANCE;  Surgeon: Rolm Bookbinder, MD;  Location: [facility];  Service: General;  Laterality: Right;   TUBAL LIGATION      SOCIAL HISTORY: Social History   Socioeconomic History   Marital status: Legally Separated    Spouse name: Not on file   Number of children: 3   Years  of education: Not on file   Highest education level: 9th grade  Occupational History   Occupation: Architect  Tobacco Use   Smoking status: Never   Smokeless tobacco: Never  Vaping Use   Vaping Use: Never used  Substance and Sexual Activity   Alcohol use: No   Drug use: Never   Sexual activity: Yes    Birth control/protection: Surgical  Other Topics Concern   Not on file  Social History Narrative   Not on file   Social Determinants of Health   Financial Resource Strain: Not on file  Food Insecurity: No Food Insecurity (10/11/2022)   Hunger Vital Sign    Worried About Running Out of Food in the Last Year: Never true    Ran Out of Food in the Last Year: Never true  Transportation Needs: No Transportation Needs (10/11/2022)   PRAPARE - Hydrologist (Medical):  No    Lack of Transportation (Non-Medical): No  Physical Activity: Not on file  Stress: Not on file  Social Connections: Not on file  Intimate Partner Violence: Not At Risk (10/11/2022)   Humiliation, Afraid, Rape, and Kick questionnaire    Fear of Current or Ex-Partner: No    Emotionally Abused: No    Physically Abused: No    Sexually Abused: No    FAMILY HISTORY: Family History  Problem Relation Age of Onset   Heart disease Maternal Grandmother     Review of Systems  Constitutional:  Positive for fatigue. Negative for appetite change, chills, fever and unexpected weight change.  HENT:   Negative for hearing loss, lump/mass and trouble swallowing.   Eyes:  Negative for eye problems and icterus.  Respiratory:  Negative for chest tightness, cough and shortness of breath.   Cardiovascular:  Negative for chest pain, leg swelling and palpitations.  Gastrointestinal:  Negative for abdominal distention, abdominal pain, constipation, diarrhea, nausea and vomiting.  Endocrine: Negative for hot flashes.  Genitourinary:  Negative for difficulty urinating.   Musculoskeletal:  Positive for  arthralgias.  Skin:  Positive for rash. Negative for itching.  Neurological:  Negative for dizziness, extremity weakness, headaches and numbness.  Hematological:  Negative for adenopathy. Does not bruise/bleed easily.  Psychiatric/Behavioral:  Negative for depression. The patient is not nervous/anxious.       PHYSICAL EXAMINATION  ECOG PERFORMANCE STATUS: 1 - Symptomatic but completely ambulatory  Vitals:   11/25/22 0915  BP: 126/84  Pulse: 85  Resp: 14  Temp: (!) 97.5 F (36.4 C)  SpO2: 99%     Physical Exam Constitutional:      Appearance: Normal appearance.  Chest:     Comments: Ongoing skin changes from radiation with some skin desquamation. No infection  Neurological:     Mental Status: She is alert.      LABORATORY DATA:  None today  RADIOGRAPHIC STUDIES:  No results found.    ASSESSMENT and THERAPY PLAN:   Malignant neoplasm of upper-outer quadrant of left breast in female, estrogen receptor positive ([name]) Khristen is a 47 year old Spanish-speaking woman who has a 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  She was most recently seen for ongoing breast redness, given a course of antibiotics but had persistent redness hence we proceeded with a punch biopsy.  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  Pathology was sent to [facility] for second opinion as well. This confirmed triple negative breast cancer.  She progressed on after 2 cycles of TC hence we have switched her treatment to Enhertu.   Repeat biopsy demonstrated her HER2 to be 1+.  Given locally advanced and unresectable breast cancer and the progression on TC and previous exposure to Adriamycin based regimen, we decided to try Enhertu for HER2 low disease and she had robust response.  She is now status post left mastectomy and axillary resection and there is no evidence of residual tumor,  complete pathologic response obtained.  Since this is a recurrent breast cancer and high risk for systemic disease, I have discussed about adjuvant Xeloda based on the create X trial I will start her on adjuvant Xeloda after completion of radiation.  Once again we discussed the excellent response achieved so far, role of antiestrogen therapy like tamoxifen as well as adjuvant chemotherapy leg Xeloda.  She is she is now here after radiation and will start  on adjuvant Xeloda at 1000 mg/m (1250 mg/m was original dose used and create X) twice daily 2 weeks on 1 week off for 6-8 cycles.  She will start on antiestrogen therapy after completing adjuvant Xeloda.  I once again discussed about adverse effects of Xeloda including but not limited to fatigue, nausea, vomiting, diarrhea, hand-foot syndrome, coronary vasospasm, severe toxicity in patients with DPD deficiency using a certified spanish interpretor.  She will start xeloda in 2 weeks.   All questions were answered. The patient knows to call the clinic with any problems, questions or concerns. We can certainly see the patient much sooner if necessary.  Total time spent: 30 minutes.  Certified Spanish interpreter was used for the entirety of this conversation  *Total Encounter Time as defined by the Centers for Medicare and Medicaid Services includes, in addition to the face-to-face time of a patient visit (documented in the note above) non-face-to-face time: obtaining and reviewing outside history, ordering and reviewing medications, tests or procedures, care coordination (communications with other health care professionals or caregivers) and documentation in the medical record.

## 2022-11-24 ENCOUNTER — Ambulatory Visit: Payer: No Typology Code available for payment source | Admitting: Physical Therapy

## 2022-11-24 ENCOUNTER — Encounter: Payer: Self-pay | Admitting: Physical Therapy

## 2022-11-24 ENCOUNTER — Other Ambulatory Visit: Payer: Self-pay

## 2022-11-24 ENCOUNTER — Ambulatory Visit
Admission: RE | Admit: 2022-11-24 | Discharge: 2022-11-24 | Disposition: A | Discharge status: Home / Self Care | Payer: No Typology Code available for payment source | Source: Ambulatory Visit | Attending: Radiation Oncology | Admitting: Radiation Oncology

## 2022-11-24 DIAGNOSIS — C50412 Malignant neoplasm of upper-outer quadrant of left female breast: Secondary | ICD-10-CM

## 2022-11-24 DIAGNOSIS — I972 Postmastectomy lymphedema syndrome: Secondary | ICD-10-CM

## 2022-11-24 DIAGNOSIS — R293 Abnormal posture: Secondary | ICD-10-CM

## 2022-11-24 DIAGNOSIS — M25612 Stiffness of left shoulder, not elsewhere classified: Secondary | ICD-10-CM

## 2022-11-24 DIAGNOSIS — Z483 Aftercare following surgery for neoplasm: Secondary | ICD-10-CM

## 2022-11-24 LAB — RAD ONC ARIA SESSION SUMMARY
Course Elapsed Days: 36
Plan Fractions Treated to Date: 14
Plan Prescribed Dose Per Fraction: 1.8 Gy
Plan Total Fractions Prescribed: 14
Plan Total Prescribed Dose: 25.2 Gy
Reference Point Dosage Given to Date: 25.2 Gy
Reference Point Session Dosage Given: 1.8 Gy
Session Number: 27

## 2022-11-24 NOTE — Therapy (Signed)
OUTPATIENT PHYSICAL THERAPY ONCOLOGY TREATMENT  Patient Name: [name] MRN: [id number] DOB:08-10-1976, 47 y.o., female Today's Date: 11/24/2022  END OF SESSION:  PT End of Session - 11/24/22 1205     Visit Number 14    Number of Visits 18    Date for PT Re-Evaluation 12/08/22    PT Start Time 1203    PT Stop Time 1307    PT Time Calculation (min) 64 min    Activity Tolerance Patient tolerated treatment well    Behavior During Therapy [facility] for tasks assessed/performed                  Past Medical History:  Diagnosis Date   Allergy    Asthma    Breast cancer in female [facility])    Left   Breast disorder    breast cancer May 2021   Chronic back pain    Headache    High triglycerides    History of breast cancer    History of COVID-19 04/20/2021   Hx of migraines    Morbid obesity ([place])    Personal history of chemotherapy    Personal history of radiation therapy    Past Surgical History:  Procedure Laterality Date   BREAST LUMPECTOMY     BREAST LUMPECTOMY WITH RADIOACTIVE SEED AND SENTINEL LYMPH NODE BIOPSY Left 07/15/2020   Procedure: LEFT BREAST LUMPECTOMY WITH RADIOACTIVE SEED AND LEFT AXILLARY SENTINEL LYMPH NODE BIOPSY, LEFT AXILLARY NODE RADIOACTIVE SEED GUIDED EXCISION, LEFT BREAST RADIOACTIVE SEED GUIDED EXCISION IM NODE;  Surgeon: Rolm Bookbinder, MD;  Location: [place];  Service: General;  Laterality: Left;  PEC BLOCK   CESAREAN SECTION WITH BILATERAL TUBAL LIGATION Bilateral 07/23/2015   Procedure: CESAREAN SECTION WITH BILATERAL TUBAL LIGATION;  Surgeon: Donnamae Jude, MD;  Location: [facility];  Service: Obstetrics;  Laterality: Bilateral;   IR IMAGING GUIDED PORT INSERTION  02/11/2022   MODIFIED MASTECTOMY Left 08/22/2022   Procedure: LEFT MODIFIED RADICAL MASTECTOMY, LEFT AXILLARY NODE DISSECTION, AND SKIN BIOPSY OF BACK;  Surgeon: Rolm Bookbinder, MD;  Location: [facility];  Service: General;  Laterality: Left;   PORT-A-CATH REMOVAL N/A  04/30/2021   Procedure: REMOVAL PORT-A-CATH;  Surgeon: Rolm Bookbinder, MD;  Location: [place];  Service: General;  Laterality: N/A;   PORTACATH PLACEMENT Right 02/11/2020   Procedure: INSERTION PORT-A-CATH WITH ULTRASOUND GUIDANCE;  Surgeon: Rolm Bookbinder, MD;  Location: [place];  Service: General;  Laterality: Right;   TUBAL LIGATION     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[place]) 04/01/2022   Encounter for dental examination 03/11/2022   Acute apical periodontitis 03/11/2022   Phobia of dental procedure 03/11/2022   Defective dental restoration 03/11/2022   Accretions on teeth 03/11/2022   Chronic periodontitis 03/11/2022   Teeth missing 03/11/2022   Caries 03/11/2022   Generalized gingival recession 03/11/2022   Pain, dental 03/08/2022   Cancer-related pain 03/08/2022   Port-A-Cath in place 02/15/2022   Oral allergy syndrome, subsequent encounter 02/03/2022   Adverse effect of other drugs, medicaments and biological substances, subsequent encounter 02/02/2022   Other adverse food reactions, not elsewhere classified, subsequent encounter 02/02/2022   Chest tightness 02/02/2022   Other allergic rhinitis 02/02/2022   Allergic conjunctivitis of  both eyes 02/02/2022   Language barrier 12/11/2020   Morbid obesity ([place])    History of breast cancer    Genetic testing 02/13/2020   Malignant neoplasm of upper-outer quadrant of left breast in female, estrogen receptor positive ([place]) 01/27/2020   Impaired ability to use community resources due to language barrier 07/13/2013    PCP: Lazaro Arms, NP  REFERRING PROVIDER: Benay Pike, MD  REFERRING DIAG: Left UE lymphedema  THERAPY DIAG:   Postmastectomy lymphedema  Stiffness of left shoulder, not elsewhere classified  Aftercare following surgery for neoplasm  Abnormal posture  Malignant neoplasm of upper-outer quadrant of left breast in female, estrogen receptor positive ([place])  ONSET DATE: 08/01/22  Rationale for Evaluation and Treatment: Rehabilitation  SUBJECTIVE:                                                                                                                                                                                           SUBJECTIVE STATEMENT:  I don't see any changes with the swelling. There was one day when I was not wearing the sleeve because I had the bandages on for two days and the swelling went down a lot. It was down for a while but then started to swell again.   PERTINENT HISTORY:  Patient was diagnosed on 01/23/2020 with left grade III invasive ductal carcinoma breast cancer. It is ER positive, PR negative, and HER2 negative , but functionally Triple Negative,with a Ki67 of 40%. Patient with interpreter present. She underwent neoadjuvant chemotherapy from 02/12/2020 - 05/28/2020. 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 She underwent a left lumpectomy and sentinel node biopsy ( 4 negative nodes) on 07/15/2020. She underwent radiation and anti-estrogen therapy.  She had interval development of diffuse skin thickening in 2023. She had a punch biopsy of her skin at that time and this was consistent with inflammatory breast cancer. She has been treated with chemo since then. She had a left modified radical mastectomy on 08/22/2022 with complete therapeutic response and 0/1 LN. She noted some swelling in her left arm and on 08/25/2022 she had an [place] that was negative for DVT. Her SOZO screen on 09/19/2021 was 6.9 points above baseline putting her in the yellow zone and she was given a compression sleeve.  PAIN:  Are you having pain? No feeling better since bandaging has gotten arm  smaller  PRECAUTIONS: Other: lymphedema [name]  WEIGHT BEARING RESTRICTIONS: No  FALLS:  Has patient fallen in last 6 months? 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  LEISURE: pt does not exercise  HAND DOMINANCE: right   PRIOR LEVEL OF FUNCTION: Independent  PATIENT GOALS: get the swelling down, improve L shoulder ROM, decrease pain   OBJECTIVE:  COGNITION: Overall cognitive status: Within functional limits for tasks assessed   PALPATION: Decreased scar mobility  OBSERVATIONS / OTHER ASSESSMENTS: scar well healed, increased edema at lateral trunk and throughout [name]  POSTURE: forward head, rounded shoulders  UPPER EXTREMITY AROM/PROM:  A/PROM RIGHT   eval   Shoulder extension 68  Shoulder flexion 160  Shoulder abduction 175  Shoulder internal rotation 56  Shoulder external rotation 88    (Blank rows = not tested)  A/PROM LEFT   eval LEFT 11/10/22  Shoulder extension 52   Shoulder flexion 146 150  Shoulder abduction 148 160  Shoulder internal rotation 67   Shoulder external rotation 86     (Blank rows = not tested)   LYMPHEDEMA ASSESSMENTS:  SURGERY TYPE/DATE: Left Lumpectomy with SLNB11/11/2019, Left modified mastectomy with left axillary node dissection NUMBER OF LYMPH NODES REMOVED: 0/4 with lumpectomy, 0/1 during Mastectomy CHEMOTHERAPY: yes RADIATION:Yes HORMONE TREATMENT: may require INFECTIONS: none  LYMPHEDEMA ASSESSMENTS:  LANDMARK RIGHT  eval RIGHT 11/08/22  10 cm proximal to olecranon process 31.4 34.4  Olecranon process 27.'2 28  10 '$ cm proximal to ulnar styloid process 25.7 25.5  Just proximal to ulnar styloid process  18.4 19  Across hand at thumb web space 20 20  At base of 2nd digit 6.4 6.5  (Blank rows = not tested)  LANDMARK LEFT  eval LEFT 10/25/22 Left  11/03/22 LEFT 11/08/22  10 cm proximal to olecranon process 33 33 31.9 33  Olecranon process 28.4 28.5 28.'5 29  10 '$ cm proximal to ulnar styloid process 26 26.5 26.4 26.6  Just proximal to ulnar styloid process 19.4 19 18.9 19.1  Across hand at thumb web space 21 20.5 19.7 20.7  2nd digit    6.7   TODAY'S TREATMENT:   11/24/2022 MLD: Short neck, Lt inguinal nodes, Lt axillo-inguinal anastomosis avoiding radiation field, L UE working proximal to distal then retracing all steps. Did not work on lateral trunk due to compromised skin from radiation Compression Bandaging: TG soft from hand to axilla,  mollelast to digits 1-5 with  , gray foam over dorsum of hand and wrist, artiflex from hand to axilla, 1 6 cm bandage at hand, 1 8 cm bandage from wrist to axilla with x at antecubital fossa and 1 12 cm bandage from wrist to axilla   11/22/2022 Resting HR 105 upon arrival.  BP normal for patient.  Resting HR looks to be around 80-85 for other visits previously.  Discussion regarding morning so far and pt said she was on the radiation table longer than normal as they got xrays and she felt increased pain and stress the whole time. This could have contributed to HR elevation.  Post treatment pt was back to 80 and she felt better.  Checked and monitored status around 77mn.  MLD: Short neck, superficial and deep abdominals, Lt axillo-inguinal and anterior inter-axillary anastomosis avoiding radiation field, then to Rt s/l to work on L lateral trunk where pt has increased edema then retracing all steps  Compression Bandaging: Cocoa butter, TG soft from hand  to axilla,  mollelast to digits 1-4 with  , gray foam over dorsum of hand and wrist, artiflex from hand to axilla, 1 6 cm bandage at hand, 1 8 cm bandage from wrist to axilla with x at antecubital fossa and 1 12 cm  bandage from wrist to axilla reviewing correct spacing and tension while performing.   11/17/2022 Removed bandage - pt washed arm - some redness at the elbow due to irritation.  Pts chest is very red with some wet desquamation and one spot that has some cloudy fluid at the lateral edge of the incision and one at the medial side.  Pt will let rad know tomorrow to see about what to put on.  MLD: Short neck, superficial and deep abdominals, Lt axillo-inguinal and anterior inter-axillary anastomosis avoiding radiation field, then to Rt s/l to work on L lateral trunk where pt has increased edema then retracing all steps  Compression Bandaging: Cocoa butter, TG soft from hand to axilla,  mollelast to digits 1-4 with  , gray foam over dorsum of hand and wrist, artiflex from hand to axilla, 1 6 cm bandage at hand, 1 8 cm bandage from wrist to axilla with x at antecubital fossa and 1 12 cm bandage from wrist to axilla reviewing correct spacing and tension while performing.   11/15/2022 Pulleys x 1:30 flexion, scaption, abd Wall slides x 5 Discussed purchasing pulleys for home use and showed pulleys on [employer] MLD: Short neck, superficial and deep abdominals, Lt inguinal and Rt axillary nodes, Lt axillo-inguinal and anterior inter-axillary anastomosis, then to R s/l to work on L lateral trunk where pt has increased edema then retracing all steps  Compression Bandaging: Cocoa butter, TG soft from hand to axilla,  mollelast to digits 1-4 with  , gray foam over dorsum of hand and wrist, artiflex from hand to axilla, 1 6 cm bandage at hand, 1 8 cm bandage from wrist to axilla with x at antecubital fossa and 1 12 cm bandage from wrist to axilla reviewing correct spacing and tension while performing. Pt educated to wash bandages next time she has sleeve on and instructed how to wash and dry. Pt with questions through interpreter about if she would have to wear the sleeve forever and use the night garment also.  When told  yes she did get a little teary but recovered quickly.                                                                                                                                     PATIENT EDUCATION:  Education details: lymphedema, anatomy and physiology of the lymphatic system, how lymphedema is treated, subclinical lymphedema vs lymphedema, need for compression bra Person educated: Patient Education method: Explanation Education comprehension: verbalized understanding  HOME EXERCISE PROGRAM: Obtain compression bra, continue to wear sleeve and glove  ASSESSMENT:  CLINICAL IMPRESSION: Pt is experiencing more skin breakdown  secondary  to radiation. She has only one treatment left. Focused today on MLD and bandaging to L UE. Avoided lateral trunk due to fragility of skin. Pt has increased swelling inferior to mastectomy scar and lateral trunk and will benefit from MLD to this area once it is healed enough to begin. Pt reports increased back pain recently and difficulty sitting up from supine and required assistance. She may benefit from updating POC to address this. Issued info to pt on a website in [place] where she can get information regarding breast cancer as well as info on free counseling services through [facility].   OBJECTIVE IMPAIRMENTS: decreased knowledge of condition, decreased ROM, decreased strength, increased edema, increased fascial restrictions, impaired UE functional use, postural dysfunction, and pain.   ACTIVITY LIMITATIONS: carrying, lifting, and reach over head  PARTICIPATION LIMITATIONS: cleaning, laundry, community activity, occupation, and yard work  PERSONAL FACTORS: Time since onset of injury/illness/exacerbation are also affecting patient's functional outcome.   REHAB POTENTIAL: Good  CLINICAL DECISION MAKING: Stable/uncomplicated  EVALUATION COMPLEXITY: Low  GOALS: Goals reviewed with patient? Yes   SHORT TERM GOALS = LONG TERM GOALS: Target date:  11/09/22  Pt will be independent in self MLD for long term management of lymphedema.  Baseline:  Goal status: IN PROGRESS 11/10/22- pt has been practicing but does not feel completely independent  2.  Pt will obtain appropriate compression garments for long term management of lymphedema.  Baseline:  Goal status: IN PROGRESS  3.  Pt will demonstrate 160 degrees of L shoulder flexion to allow her to reach overhead. Baseline:  Goal status: IN PROGRESS 11/10/22 - 150  4. Pt will demonstrate 170 degrees of L shoulder abduction to allow her to reach out to the side.   Goal: IN PROGRESS 11/10/22 - 160  5. Pt will be independent in a home exercise program for continued stretching and strengthening.  Goal: IN PROGRESS 11/10/22  6. Pt will report she is able to raise her L arm to end range wihtout increased pain in her axilla to allow improved comfort.   GOAL: IN PROGRESS - pt still having pain when L UE ROM 11/10/22   PLAN:  PT FREQUENCY: 2x/week  PT DURATION: 4 weeks  PLANNED INTERVENTIONS: Therapeutic exercises, Therapeutic activity, Patient/Family education, Self Care, Joint mobilization, Manual lymph drainage, Compression bandaging, scar mobilization, Taping, Vasopneumatic device, and Manual therapy  PLAN FOR NEXT SESSION:pt to be measured on 11/23/22 by cassidy for custom day/night garments; cont MLD to L UE and lateral trunk and instructing pt, PROM for L UE and STM to pect tendon  [employer], PT 11/24/2022, 1:14 PM

## 2022-11-25 ENCOUNTER — Inpatient Hospital Stay
Payer: No Typology Code available for payment source | Attending: Hematology and Oncology | Admitting: Hematology and Oncology

## 2022-11-25 ENCOUNTER — Other Ambulatory Visit: Payer: Self-pay

## 2022-11-25 ENCOUNTER — Ambulatory Visit
Admission: RE | Admit: 2022-11-25 | Discharge: 2022-11-25 | Disposition: A | Discharge status: Home / Self Care | Payer: No Typology Code available for payment source | Source: Ambulatory Visit | Attending: Radiation Oncology | Admitting: Radiation Oncology

## 2022-11-25 VITALS — BP 126/84 | HR 85 | Temp 97.5°F | Resp 14 | Ht 61.0 in | Wt 192.0 lb

## 2022-11-25 DIAGNOSIS — Z923 Personal history of irradiation: Secondary | ICD-10-CM | POA: Insufficient documentation

## 2022-11-25 DIAGNOSIS — C50912 Malignant neoplasm of unspecified site of left female breast: Secondary | ICD-10-CM

## 2022-11-25 DIAGNOSIS — C50412 Malignant neoplasm of upper-outer quadrant of left female breast: Secondary | ICD-10-CM | POA: Insufficient documentation

## 2022-11-25 DIAGNOSIS — Z170 Estrogen receptor positive status [ER+]: Secondary | ICD-10-CM | POA: Insufficient documentation

## 2022-11-25 DIAGNOSIS — Z9012 Acquired absence of left breast and nipple: Secondary | ICD-10-CM | POA: Insufficient documentation

## 2022-11-25 DIAGNOSIS — C792 Secondary malignant neoplasm of skin: Secondary | ICD-10-CM | POA: Insufficient documentation

## 2022-11-25 LAB — RAD ONC ARIA SESSION SUMMARY
Course Elapsed Days: 37
Plan Fractions Treated to Date: 14
Plan Prescribed Dose Per Fraction: 1.8 Gy
Plan Total Fractions Prescribed: 14
Plan Total Prescribed Dose: 25.2 Gy
Reference Point Dosage Given to Date: 25.2 Gy
Reference Point Session Dosage Given: 1.8 Gy
Session Number: 28

## 2022-11-25 MED ORDER — CAPECITABINE 500 MG PO TABS: 1000.0000 mg/m2 | ORAL_TABLET | Freq: Two times a day (BID) | ORAL | 5 refills | Status: DC

## 2022-11-25 MED ORDER — PROCHLORPERAZINE MALEATE 10 MG PO TABS: 10.0000 mg | ORAL_TABLET | Freq: Four times a day (QID) | ORAL | 1 refills | Status: DC | PRN

## 2022-11-25 MED ORDER — ONDANSETRON HCL 8 MG PO TABS: 8.0000 mg | ORAL_TABLET | Freq: Three times a day (TID) | ORAL | 1 refills | Status: DC | PRN

## 2022-11-25 NOTE — Radiation Completion Notes (Signed)
Patient Name: [name], [name] MRN: [id number] Date of Birth: August 31, 1976 Referring Physician: Rolm Bookbinder, M.D. Date of Service: 2022-11-25 Radiation Oncologist: Eppie Gibson, M.D. [place]                             RADIATION ONCOLOGY END OF TREATMENT NOTE     Diagnosis: C50.412 Malignant neoplasm of upper-outer quadrant of left female breast Staging on 2020-07-15: Malignant neoplasm of upper-outer quadrant of left breast in female, estrogen receptor positive ([place]) T=pT1b, N=pN0, M=cM0 Staging on 2020-01-29: Malignant neoplasm of upper-outer quadrant of left breast in female, estrogen receptor positive ([place]) T=cT2, N=cN1, M=cM0 Intent: Curative     ==========DELIVERED PLANS==========  First Treatment Date: 2022-10-19 - Last Treatment Date: 2022-11-25   Plan Name: CW_L_BH_BO Site: Chest Wall, Left Technique: 3D Mode: Photon Dose Per Fraction: 1.8 Gy Prescribed Dose (Delivered / Prescribed): 25.2 Gy / 25.2 Gy Prescribed Fxs (Delivered / Prescribed): 14 / 14   Plan Name: CW_L_BH Site: Chest Wall, Left Technique: 3D Mode: Photon Dose Per Fraction: 1.8 Gy Prescribed Dose (Delivered / Prescribed): 25.2 Gy / 25.2 Gy Prescribed Fxs (Delivered / Prescribed): 14 / 14     ==========ON TREATMENT VISIT DATES========== 2022-10-24, 2022-10-31, 2022-11-07, 2022-11-14, 2022-11-16, 2022-11-21, 2022-11-23     ==========UPCOMING VISITS==========       ==========APPENDIX - ON TREATMENT VISIT NOTES==========   See weekly On Treatment Notes is Epic for details.

## 2022-11-29 ENCOUNTER — Telehealth: Payer: Self-pay | Admitting: Pharmacy Technician

## 2022-11-29 ENCOUNTER — Other Ambulatory Visit: Payer: Self-pay

## 2022-11-29 ENCOUNTER — Telehealth: Payer: Self-pay

## 2022-11-29 ENCOUNTER — Inpatient Hospital Stay: Payer: No Typology Code available for payment source

## 2022-11-29 ENCOUNTER — Other Ambulatory Visit (HOSPITAL_COMMUNITY): Payer: Self-pay

## 2022-11-29 ENCOUNTER — Ambulatory Visit: Payer: No Typology Code available for payment source | Admitting: Physical Therapy

## 2022-11-29 ENCOUNTER — Encounter: Payer: Self-pay | Admitting: Physical Therapy

## 2022-11-29 DIAGNOSIS — M25612 Stiffness of left shoulder, not elsewhere classified: Secondary | ICD-10-CM

## 2022-11-29 DIAGNOSIS — R293 Abnormal posture: Secondary | ICD-10-CM

## 2022-11-29 DIAGNOSIS — I972 Postmastectomy lymphedema syndrome: Secondary | ICD-10-CM

## 2022-11-29 DIAGNOSIS — Z170 Estrogen receptor positive status [ER+]: Secondary | ICD-10-CM

## 2022-11-29 DIAGNOSIS — Z95828 Presence of other vascular implants and grafts: Secondary | ICD-10-CM

## 2022-11-29 DIAGNOSIS — Z483 Aftercare following surgery for neoplasm: Secondary | ICD-10-CM

## 2022-11-29 DIAGNOSIS — C50912 Malignant neoplasm of unspecified site of left female breast: Secondary | ICD-10-CM

## 2022-11-29 LAB — CBC WITH DIFFERENTIAL/PLATELET
Abs Immature Granulocytes: 0.02 10*3/uL (ref 0.00–0.07)
Basophils Absolute: 0 10*3/uL (ref 0.0–0.1)
Basophils Relative: 1 %
Eosinophils Absolute: 0.1 10*3/uL (ref 0.0–0.5)
Eosinophils Relative: 3 %
HCT: 39.4 % (ref 36.0–46.0)
Hemoglobin: 12.9 g/dL (ref 12.0–15.0)
Immature Granulocytes: 1 %
Lymphocytes Relative: 22 %
Lymphs Abs: 0.9 10*3/uL (ref 0.7–4.0)
MCH: 27.3 pg (ref 26.0–34.0)
MCHC: 32.7 g/dL (ref 30.0–36.0)
MCV: 83.3 fL (ref 80.0–100.0)
Monocytes Absolute: 0.4 10*3/uL (ref 0.1–1.0)
Monocytes Relative: 10 %
Neutro Abs: 2.5 10*3/uL (ref 1.7–7.7)
Neutrophils Relative %: 63 %
Platelets: 188 10*3/uL (ref 150–400)
RBC: 4.73 MIL/uL (ref 3.87–5.11)
RDW: 13.5 % (ref 11.5–15.5)
WBC: 4 10*3/uL (ref 4.0–10.5)
nRBC: 0 % (ref 0.0–0.2)

## 2022-11-29 LAB — COMPREHENSIVE METABOLIC PANEL
ALT: 13 U/L (ref 0–44)
AST: 18 U/L (ref 15–41)
Albumin: 4.2 g/dL (ref 3.5–5.0)
Alkaline Phosphatase: 88 U/L (ref 38–126)
Anion gap: 6 (ref 5–15)
BUN: 15 mg/dL (ref 6–20)
CO2: 29 mmol/L (ref 22–32)
Calcium: 9.8 mg/dL (ref 8.9–10.3)
Chloride: 104 mmol/L (ref 98–111)
Creatinine, Ser: 0.67 mg/dL (ref 0.44–1.00)
GFR, Estimated: >60 mL/min (ref >60)
Glucose, Bld: 93 mg/dL (ref 70–99)
Potassium: 4.1 mmol/L (ref 3.5–5.1)
Sodium: 139 mmol/L (ref 135–145)
Total Bilirubin: 0.3 mg/dL (ref 0.3–1.2)
Total Protein: 7.2 g/dL (ref 6.5–8.1)

## 2022-11-29 MED ORDER — HEPARIN SOD (PORK) LOCK FLUSH 100 UNIT/ML IV SOLN
500.0000 [IU] | Freq: Once | INTRAVENOUS | Status: AC
  Administered 2022-11-29: 500 [IU]

## 2022-11-29 MED ORDER — SODIUM CHLORIDE 0.9% FLUSH
10.0000 mL | Freq: Once | INTRAVENOUS | Status: AC
  Administered 2022-11-29: 10 mL

## 2022-11-29 NOTE — Therapy (Signed)
OUTPATIENT PHYSICAL THERAPY ONCOLOGY TREATMENT  Patient Name: [name] MRN: [id number] DOB:October 27, 1975, 47 y.o., female Today's Date: 11/29/2022  END OF SESSION:  PT End of Session - 11/29/22 1207     Visit Number 15    Number of Visits 18    Date for PT Re-Evaluation 12/08/22    PT Start Time 1206    PT Stop Time 1257    PT Time Calculation (min) 51 min    Activity Tolerance Patient tolerated treatment well    Behavior During Therapy [facility] for tasks assessed/performed                  Past Medical History:  Diagnosis Date   Allergy    Asthma    Breast cancer in female [facility])    Left   Breast disorder    breast cancer May 2021   Chronic back pain    Headache    High triglycerides    History of breast cancer    History of COVID-19 04/20/2021   Hx of migraines    Morbid obesity ([place])    Personal history of chemotherapy    Personal history of radiation therapy    Past Surgical History:  Procedure Laterality Date   BREAST LUMPECTOMY     BREAST LUMPECTOMY WITH RADIOACTIVE SEED AND SENTINEL LYMPH NODE BIOPSY Left 07/15/2020   Procedure: LEFT BREAST LUMPECTOMY WITH RADIOACTIVE SEED AND LEFT AXILLARY SENTINEL LYMPH NODE BIOPSY, LEFT AXILLARY NODE RADIOACTIVE SEED GUIDED EXCISION, LEFT BREAST RADIOACTIVE SEED GUIDED EXCISION IM NODE;  Surgeon: Rolm Bookbinder, MD;  Location: [place];  Service: General;  Laterality: Left;  PEC BLOCK   CESAREAN SECTION WITH BILATERAL TUBAL LIGATION Bilateral 07/23/2015   Procedure: CESAREAN SECTION WITH BILATERAL TUBAL LIGATION;  Surgeon: Donnamae Jude, MD;  Location: [place] [facility];  Service: Obstetrics;  Laterality: Bilateral;   IR IMAGING GUIDED PORT INSERTION  02/11/2022   MODIFIED MASTECTOMY Left 08/22/2022   Procedure: LEFT MODIFIED RADICAL MASTECTOMY, LEFT AXILLARY NODE DISSECTION, AND SKIN BIOPSY OF BACK;  Surgeon: Rolm Bookbinder, MD;  Location: [facility];  Service: General;  Laterality: Left;   PORT-A-CATH REMOVAL N/A  04/30/2021   Procedure: REMOVAL PORT-A-CATH;  Surgeon: Rolm Bookbinder, MD;  Location: [place];  Service: General;  Laterality: N/A;   PORTACATH PLACEMENT Right 02/11/2020   Procedure: INSERTION PORT-A-CATH WITH ULTRASOUND GUIDANCE;  Surgeon: Rolm Bookbinder, MD;  Location: [place];  Service: General;  Laterality: Right;   TUBAL LIGATION     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[place]) 04/01/2022   Encounter for dental examination 03/11/2022   Acute apical periodontitis 03/11/2022   Phobia of dental procedure 03/11/2022   Defective dental restoration 03/11/2022   Accretions on teeth 03/11/2022   Chronic periodontitis 03/11/2022   Teeth missing 03/11/2022   Caries 03/11/2022   Generalized gingival recession 03/11/2022   Pain, dental 03/08/2022   Cancer-related pain 03/08/2022   Port-A-Cath in place 02/15/2022   Oral allergy syndrome, subsequent encounter 02/03/2022   Adverse effect of other drugs, medicaments and biological substances, subsequent encounter 02/02/2022   Other adverse food reactions, not elsewhere classified, subsequent encounter 02/02/2022   Chest tightness 02/02/2022   Other allergic rhinitis 02/02/2022   Allergic conjunctivitis of  both eyes 02/02/2022   Language barrier 12/11/2020   Morbid obesity ([place])    History of breast cancer    Genetic testing 02/13/2020   Malignant neoplasm of upper-outer quadrant of left breast in female, estrogen receptor positive ([place]) 01/27/2020   Impaired ability to use community resources due to language barrier 07/13/2013    PCP: Lazaro Arms, NP  REFERRING PROVIDER: Benay Pike, MD  REFERRING DIAG: Left UE lymphedema  THERAPY DIAG:   Postmastectomy lymphedema  Stiffness of left shoulder, not elsewhere classified  Aftercare following surgery for neoplasm  Abnormal posture  Malignant neoplasm of upper-outer quadrant of left breast in female, estrogen receptor positive ([place])  ONSET DATE: 08/01/22  Rationale for Evaluation and Treatment: Rehabilitation  SUBJECTIVE:                                                                                                                                                                                           SUBJECTIVE STATEMENT:  The skin opened and it is burning but I am done with radiation. The hand is not getting worse the but swelling comes and goes.   PERTINENT HISTORY:  Patient was diagnosed on 01/23/2020 with left grade III invasive ductal carcinoma breast cancer. It is ER positive, PR negative, and HER2 negative , but functionally Triple Negative,with a Ki67 of 40%. Patient with interpreter present. She underwent neoadjuvant chemotherapy from 02/12/2020 - 05/28/2020. 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 She underwent a left lumpectomy and sentinel node biopsy ( 4 negative nodes) on 07/15/2020. She underwent radiation and anti-estrogen therapy.  She had interval development of diffuse skin thickening in 2023. She had a punch biopsy of her skin at that time and this was consistent with inflammatory breast cancer. She has been treated with chemo since then. She had a left modified radical mastectomy on 08/22/2022 with complete therapeutic response and 0/1 LN. She noted some swelling in her left arm and on 08/25/2022 she had an [place] that was negative for DVT. Her SOZO screen on 09/19/2021 was 6.9 points above baseline putting her in the yellow zone and she was given a compression sleeve.  PAIN:  Are you having pain? No   PRECAUTIONS: Other: lymphedema [name]  WEIGHT BEARING RESTRICTIONS: No  FALLS:  Has patient fallen in last 6 months? 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  LEISURE: pt does not exercise  HAND DOMINANCE: right   PRIOR LEVEL OF FUNCTION: Independent  PATIENT GOALS: get the swelling down, improve L shoulder ROM, decrease pain   OBJECTIVE:  COGNITION: Overall cognitive status: Within functional limits for tasks assessed   PALPATION: Decreased scar mobility  OBSERVATIONS / OTHER ASSESSMENTS: scar well healed, increased edema at lateral trunk and throughout [name]  POSTURE: forward head, rounded shoulders  UPPER EXTREMITY AROM/PROM:  A/PROM RIGHT   eval   Shoulder extension 68  Shoulder flexion 160  Shoulder abduction 175  Shoulder internal rotation 56  Shoulder external rotation 88    (Blank rows = not tested)  A/PROM LEFT   eval LEFT 11/10/22  Shoulder extension 52   Shoulder flexion 146 150  Shoulder abduction 148 160  Shoulder internal rotation 67   Shoulder external rotation 86     (Blank rows = not tested)   LYMPHEDEMA ASSESSMENTS:  SURGERY TYPE/DATE: Left Lumpectomy with SLNB11/11/2019, Left modified mastectomy with left axillary node dissection NUMBER OF LYMPH NODES REMOVED: 0/4 with lumpectomy, 0/1 during Mastectomy CHEMOTHERAPY: yes RADIATION:Yes HORMONE TREATMENT: may require INFECTIONS: none  LYMPHEDEMA ASSESSMENTS:  LANDMARK RIGHT  eval RIGHT 11/08/22  10 cm proximal to olecranon process 31.4 34.4  Olecranon process 27.2 28  10  cm proximal to ulnar styloid process 25.7 25.5  Just proximal to ulnar styloid process 18.4 19  Across hand at thumb web space 20 20  At base of 2nd digit 6.4 6.5  (Blank rows = not tested)  LANDMARK LEFT  eval LEFT 10/25/22  Left  11/03/22 LEFT 11/08/22  10 cm proximal to olecranon process 33 33 31.9 33  Olecranon process 28.4 28.5 28.5 29  10  cm proximal to ulnar styloid process 26 26.5 26.4 26.6  Just proximal to ulnar styloid process 19.4 19 18.9 19.1  Across hand at thumb web space 21 20.5 19.7 20.7  2nd digit    6.7   TODAY'S TREATMENT:   11/29/2022 MLD: Short neck, L 5 diaphragmatic breaths, R axillary nodes and establishment of interaxillary pathway, Lt inguinal nodes, Lt axillo-inguinal anastomosis avoiding radiation field, L UE working proximal to distal, then to R s/l to work on L lateral trunk moving fluid towards pathways then back to supine retracing all steps.  PROM to L shoulder gentle in to flexion and abduction making sure to avoid any pulling at fragile skin Compression Bandaging: TG soft from hand to axilla,  mollelast to digits 1-5 with  , gray foam over dorsum of hand and wrist, artiflex from hand to axilla, 1 6 cm bandage at hand, 1 8 cm bandage from wrist to axilla with x at antecubital fossa and 1 12 cm bandage from wrist to axilla    11/24/2022 MLD: Short neck, Lt inguinal nodes, Lt axillo-inguinal anastomosis, avoiding radiation field, L UE working proximal to distal then retracing all steps. Did not work on lateral trunk due to compromised skin from radiation Compression Bandaging: TG soft from hand to axilla,  mollelast to digits 1-5 with  , gray foam over dorsum of hand and wrist, artiflex from hand to axilla, 1 6 cm bandage at hand, 1 8 cm bandage from wrist to axilla with x at antecubital fossa and 1 12 cm bandage from wrist to axilla   11/22/2022 Resting HR 105 upon arrival.  BP normal for patient.  Resting HR looks to be around 80-85 for other visits previously.  Discussion regarding morning so far and pt said she was on the radiation table longer than normal as they got xrays and she felt increased pain and stress the whole time. This could have contributed to HR elevation.  Post  treatment pt was back to 80 and she felt better.  Checked and monitored status around 44min.  MLD: Short neck, superficial and deep abdominals, Lt axillo-inguinal and anterior inter-axillary anastomosis avoiding radiation field, then to Rt s/l to work on L lateral trunk where pt has increased edema then retracing all steps  Compression Bandaging: Cocoa butter, TG soft from hand to axilla,  mollelast to digits 1-4 with  , gray foam over dorsum of hand and wrist, artiflex from hand to axilla, 1 6 cm bandage at hand, 1 8 cm bandage from wrist to axilla with x at antecubital fossa and 1 12 cm bandage from wrist to axilla reviewing correct spacing and tension while performing.   11/17/2022 Removed bandage - pt washed arm - some redness at the elbow due to irritation.  Pts chest is very red with some wet desquamation and one spot that has some cloudy fluid at the lateral edge of the incision and one at the medial side.  Pt will let rad know tomorrow to see about what to put on.  MLD: Short neck, superficial and deep abdominals, Lt axillo-inguinal and anterior inter-axillary anastomosis avoiding radiation field, then to Rt s/l to work on L lateral trunk where pt has increased edema then retracing all steps  Compression Bandaging: Cocoa butter, TG soft from hand to axilla,  mollelast to digits 1-4 with  , gray foam over dorsum of hand and wrist, artiflex from hand to axilla, 1 6 cm bandage at hand, 1 8 cm bandage from wrist to axilla with x at antecubital fossa and 1 12 cm bandage from wrist to axilla reviewing correct spacing and tension while performing.   11/15/2022 Pulleys x 1:30 flexion, scaption, abd Wall slides x 5 Discussed purchasing pulleys for home use and showed pulleys on [employer] MLD: Short neck, superficial and deep abdominals, Lt inguinal and Rt axillary nodes, Lt axillo-inguinal and anterior inter-axillary anastomosis, then to R s/l to work on L lateral trunk where pt has increased edema then  retracing all steps  Compression Bandaging: Cocoa butter, TG soft from hand to axilla,  mollelast to digits 1-4 with  , gray foam over dorsum of hand and wrist, artiflex from hand to axilla, 1 6 cm bandage at hand, 1 8 cm bandage from wrist to axilla with x at antecubital fossa and 1 12 cm bandage from wrist to axilla reviewing correct spacing and tension while performing. Pt educated to wash bandages next time she has sleeve on and instructed how to wash and dry. Pt with questions through interpreter about if she would have to wear the sleeve forever and use the night garment also.  When told yes she did get a little teary but recovered quickly.  PATIENT EDUCATION:  Education details: lymphedema, anatomy and physiology of the lymphatic system, how lymphedema is treated, subclinical lymphedema vs lymphedema, need for compression bra Person educated: Patient Education method: Explanation Education comprehension: verbalized understanding  HOME EXERCISE PROGRAM: Obtain compression bra, continue to wear sleeve and glove  ASSESSMENT:  CLINICAL IMPRESSION: Pt has now completed radiation. Her skin is starting to heal but she still has a lot of breakdown near axilla and her scar which is open. Care was taken today to avoid radiated areas and to avoid any stretch on the skin. Pt tolerated PROM very well today. Continued with MLD to [name] and was able to incorporate some of L lateral trunk avoiding reddened area.   OBJECTIVE IMPAIRMENTS: decreased knowledge of condition, decreased ROM, decreased strength, increased edema, increased fascial restrictions, impaired UE functional use, postural dysfunction, and pain.   ACTIVITY LIMITATIONS: carrying, lifting, and reach over head  PARTICIPATION LIMITATIONS: cleaning, laundry, community activity, occupation, and yard work  PERSONAL  FACTORS: Time since onset of injury/illness/exacerbation are also affecting patient's functional outcome.   REHAB POTENTIAL: Good  CLINICAL DECISION MAKING: Stable/uncomplicated  EVALUATION COMPLEXITY: Low  GOALS: Goals reviewed with patient? Yes   SHORT TERM GOALS = LONG TERM GOALS: Target date: 11/09/22  Pt will be independent in self MLD for long term management of lymphedema.  Baseline:  Goal status: IN PROGRESS 11/10/22- pt has been practicing but does not feel completely independent  2.  Pt will obtain appropriate compression garments for long term management of lymphedema.  Baseline:  Goal status: IN PROGRESS  3.  Pt will demonstrate 160 degrees of L shoulder flexion to allow her to reach overhead. Baseline:  Goal status: IN PROGRESS 11/10/22 - 150  4. Pt will demonstrate 170 degrees of L shoulder abduction to allow her to reach out to the side.   Goal: IN PROGRESS 11/10/22 - 160  5. Pt will be independent in a home exercise program for continued stretching and strengthening.  Goal: IN PROGRESS 11/10/22  6. Pt will report she is able to raise her L arm to end range wihtout increased pain in her axilla to allow improved comfort.   GOAL: IN PROGRESS - pt still having pain when L UE ROM 11/10/22   PLAN:  PT FREQUENCY: 2x/week  PT DURATION: 4 weeks  PLANNED INTERVENTIONS: Therapeutic exercises, Therapeutic activity, Patient/Family education, Self Care, Joint mobilization, Manual lymph drainage, Compression bandaging, scar mobilization, Taping, Vasopneumatic device, and Manual therapy  PLAN FOR NEXT SESSION:pt to be measured on 11/23/22 by cassidy for custom day/night garments; cont MLD to L UE and lateral trunk and instructing pt, PROM for L UE and STM to pect tendon  [employer], PT 11/29/2022, 2:02 PM

## 2022-11-29 NOTE — Telephone Encounter (Signed)
Oral Oncology Monica Advocate Encounter  After completing a benefits investigation, prior authorization for capecitabine is not required at this time as Monica is uninsured. Monica had alight grant in 2021-2022 but has exhausted all funding.  Cash pricing at [facility] for her dose will be $75    [name], [name] [name] Direct Number: [phone]  Fax: [phone]

## 2022-11-30 ENCOUNTER — Encounter: Payer: Self-pay | Admitting: Hematology and Oncology

## 2022-11-30 ENCOUNTER — Telehealth: Payer: Self-pay

## 2022-11-30 NOTE — Telephone Encounter (Signed)
Oral Oncology Pharmacist Encounter  Received new prescription for Xeloda for the treatment of triple-negative breast cancer in conjunction, planned duration until disease progression or unacceptable toxicity.  Labs from 11/29/2022 assessed, no interventions needed.  Pt's CrCL is 143 mL/min.  Prescription dose and frequency assessed for appropriateness.   Current medication list in Epic reviewed, no relevant DDIs with Xeloda identified.  Evaluated chart and no patient barriers to medication adherence noted.   Patient agreement for treatment documented in MD note on 11/25/2022.  Prescription has been e-scribed to the [facility] for benefits analysis and approval.  Oral Oncology Clinic will continue to follow for insurance authorization, copayment issues, initial counseling and start date.  Donney Rankins, PharmD Candidate 11/30/2022 10:38 AM Oral Oncology Clinic [phone]

## 2022-12-01 ENCOUNTER — Encounter: Payer: Self-pay | Admitting: Rehabilitation

## 2022-12-01 ENCOUNTER — Ambulatory Visit: Payer: No Typology Code available for payment source | Admitting: Rehabilitation

## 2022-12-01 DIAGNOSIS — Z170 Estrogen receptor positive status [ER+]: Secondary | ICD-10-CM

## 2022-12-01 DIAGNOSIS — Z483 Aftercare following surgery for neoplasm: Secondary | ICD-10-CM

## 2022-12-01 DIAGNOSIS — I972 Postmastectomy lymphedema syndrome: Secondary | ICD-10-CM

## 2022-12-01 DIAGNOSIS — R293 Abnormal posture: Secondary | ICD-10-CM

## 2022-12-01 DIAGNOSIS — M25612 Stiffness of left shoulder, not elsewhere classified: Secondary | ICD-10-CM

## 2022-12-01 NOTE — Therapy (Signed)
OUTPATIENT PHYSICAL THERAPY ONCOLOGY TREATMENT  Patient Name: [name] MRN: [id number] DOB:Feb 29, 1976, 47 y.o., female Today's Date: 12/01/2022  END OF SESSION:  PT End of Session - 12/01/22 1046     Visit Number 16    Number of Visits 18    Date for PT Re-Evaluation 12/08/22    PT Start Time 1003    PT Stop Time 1048    PT Time Calculation (min) 45 min    Activity Tolerance Patient tolerated treatment well    Behavior During Therapy [facility] for tasks assessed/performed                   Past Medical History:  Diagnosis Date   Allergy    Asthma    Breast cancer in female [facility])    Left   Breast disorder    breast cancer May 2021   Chronic back pain    Headache    High triglycerides    History of breast cancer    History of COVID-19 04/20/2021   Hx of migraines    Morbid obesity ([place])    Personal history of chemotherapy    Personal history of radiation therapy    Past Surgical History:  Procedure Laterality Date   BREAST LUMPECTOMY     BREAST LUMPECTOMY WITH RADIOACTIVE SEED AND SENTINEL LYMPH NODE BIOPSY Left 07/15/2020   Procedure: LEFT BREAST LUMPECTOMY WITH RADIOACTIVE SEED AND LEFT AXILLARY SENTINEL LYMPH NODE BIOPSY, LEFT AXILLARY NODE RADIOACTIVE SEED GUIDED EXCISION, LEFT BREAST RADIOACTIVE SEED GUIDED EXCISION IM NODE;  Surgeon: Rolm Bookbinder, MD;  Location: [place];  Service: General;  Laterality: Left;  PEC BLOCK   CESAREAN SECTION WITH BILATERAL TUBAL LIGATION Bilateral 07/23/2015   Procedure: CESAREAN SECTION WITH BILATERAL TUBAL LIGATION;  Surgeon: Donnamae Jude, MD;  Location: [facility];  Service: Obstetrics;  Laterality: Bilateral;   IR IMAGING GUIDED PORT INSERTION  02/11/2022   MODIFIED MASTECTOMY Left 08/22/2022   Procedure: LEFT MODIFIED RADICAL MASTECTOMY, LEFT AXILLARY NODE DISSECTION, AND SKIN BIOPSY OF BACK;  Surgeon: Rolm Bookbinder, MD;  Location: [facility];  Service: General;  Laterality: Left;   PORT-A-CATH REMOVAL N/A  04/30/2021   Procedure: REMOVAL PORT-A-CATH;  Surgeon: Rolm Bookbinder, MD;  Location: [place];  Service: General;  Laterality: N/A;   PORTACATH PLACEMENT Right 02/11/2020   Procedure: INSERTION PORT-A-CATH WITH ULTRASOUND GUIDANCE;  Surgeon: Rolm Bookbinder, MD;  Location: [place];  Service: General;  Laterality: Right;   TUBAL LIGATION     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[place]) 04/01/2022   Encounter for dental examination 03/11/2022   Acute apical periodontitis 03/11/2022   Phobia of dental procedure 03/11/2022   Defective dental restoration 03/11/2022   Accretions on teeth 03/11/2022   Chronic periodontitis 03/11/2022   Teeth missing 03/11/2022   Caries 03/11/2022   Generalized gingival recession 03/11/2022   Pain, dental 03/08/2022   Cancer-related pain 03/08/2022   Port-A-Cath in place 02/15/2022   Oral allergy syndrome, subsequent encounter 02/03/2022   Adverse effect of other drugs, medicaments and biological substances, subsequent encounter 02/02/2022   Other adverse food reactions, not elsewhere classified, subsequent encounter 02/02/2022   Chest tightness 02/02/2022   Other allergic rhinitis 02/02/2022   Allergic conjunctivitis  of both eyes 02/02/2022   Language barrier 12/11/2020   Morbid obesity ([place])    History of breast cancer    Genetic testing 02/13/2020   Malignant neoplasm of upper-outer quadrant of left breast in female, estrogen receptor positive ([place]) 01/27/2020   Impaired ability to use community resources due to language barrier 07/13/2013    PCP: Lazaro Arms, NP  REFERRING PROVIDER: Benay Pike, MD  REFERRING DIAG: Left UE lymphedema  THERAPY DIAG:   Postmastectomy lymphedema  Stiffness of left shoulder, not elsewhere classified  Aftercare following surgery for neoplasm  Abnormal posture  Malignant neoplasm of upper-outer quadrant of left breast in female, estrogen receptor positive ([place])  ONSET DATE: 08/01/22  Rationale for Evaluation and Treatment: Rehabilitation  SUBJECTIVE:                                                                                                                                                                                           SUBJECTIVE STATEMENT:  I can't do the bandage today because it was rubbing in the side too much.  The sleeve is okay.   PERTINENT HISTORY:  Patient was diagnosed on 01/23/2020 with left grade III invasive ductal carcinoma breast cancer. It is ER positive, PR negative, and HER2 negative , but functionally Triple Negative,with a Ki67 of 40%. Patient with interpreter present. She underwent neoadjuvant chemotherapy from 02/12/2020 - 05/28/2020. 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 She underwent a left lumpectomy and sentinel node biopsy ( 4 negative nodes) on 07/15/2020. She underwent radiation and anti-estrogen therapy.  She had interval development of diffuse skin thickening in 2023. She had a punch biopsy of her skin at that time and this was consistent with inflammatory breast cancer. She has been treated with chemo since then. She had a left modified radical mastectomy on 08/22/2022 with complete therapeutic response and 0/1 LN. She noted some swelling in her left arm and on 08/25/2022 she had an [place] that was negative for DVT. Her SOZO screen on 09/19/2021 was 6.9 points above baseline putting her in the yellow zone and she was given a compression sleeve.  PAIN:  Are you having pain? No   PRECAUTIONS: Other: lymphedema [name]  WEIGHT BEARING RESTRICTIONS: No  FALLS:  Has patient fallen in last 6 months? 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  LEISURE: pt does not exercise  HAND DOMINANCE: right   PRIOR LEVEL OF FUNCTION: Independent  PATIENT GOALS: get the swelling down, improve L shoulder ROM, decrease pain   OBJECTIVE:  COGNITION: Overall cognitive status: Within functional limits for tasks assessed   PALPATION: Decreased scar mobility  OBSERVATIONS / OTHER ASSESSMENTS: scar well healed, increased edema at lateral trunk and throughout [name]  POSTURE: forward head, rounded shoulders  UPPER EXTREMITY AROM/PROM:  A/PROM RIGHT   eval   Shoulder extension 68  Shoulder flexion 160  Shoulder abduction 175  Shoulder internal rotation 56  Shoulder external rotation 88    (Blank rows = not tested)  A/PROM LEFT   eval LEFT 11/10/22  Shoulder extension 52   Shoulder flexion 146 150  Shoulder abduction 148 160  Shoulder internal rotation 67   Shoulder external rotation 86     (Blank rows = not tested)   LYMPHEDEMA ASSESSMENTS:  SURGERY TYPE/DATE: Left Lumpectomy with SLNB11/11/2019, Left modified mastectomy with left axillary node dissection NUMBER OF LYMPH NODES REMOVED: 0/4 with lumpectomy, 0/1 during Mastectomy CHEMOTHERAPY: yes RADIATION:Yes HORMONE TREATMENT: may require INFECTIONS: none  LYMPHEDEMA ASSESSMENTS:  LANDMARK RIGHT  eval RIGHT 11/08/22  10 cm proximal to olecranon process 31.4 34.4  Olecranon process 27.2 28  10  cm proximal to ulnar styloid process 25.7 25.5  Just proximal to ulnar styloid process 18.4 19  Across hand at thumb web space 20 20  At base of 2nd digit 6.4 6.5  (Blank rows = not tested)  LANDMARK LEFT  eval LEFT 10/25/22 Left  11/03/22 LEFT 11/08/22  10 cm proximal  to olecranon process 33 33 31.9 33  Olecranon process 28.4 28.5 28.5 29  10  cm proximal to ulnar styloid process 26 26.5 26.4 26.6  Just proximal to ulnar styloid process 19.4 19 18.9 19.1  Across hand at thumb web space 21 20.5 19.7 20.7  2nd digit    6.7   TODAY'S TREATMENT:   12/01/2022 MLD: Short neck, L 5 diaphragmatic breaths, R axillary nodes and establishment of interaxillary pathway, Lt inguinal nodes, Lt axillo-inguinal anastomosis avoiding radiation field, L UE working proximal to distal, then to R s/l to work on L lateral trunk moving fluid towards pathways then back to supine retracing all steps.  PROM to L shoulder gentle in to flexion and abduction making sure to avoid any pulling at fragile skin  11/29/2022 MLD: Short neck, L 5 diaphragmatic breaths, R axillary nodes and establishment of interaxillary pathway, Lt inguinal nodes, Lt axillo-inguinal anastomosis avoiding radiation field, L UE working proximal to distal, then to R s/l to work on L lateral trunk moving fluid towards pathways then back to supine retracing all steps.  PROM to L shoulder gentle in to flexion and abduction making sure to avoid any pulling at fragile skin Compression Bandaging: TG soft from hand to axilla,  mollelast to digits 1-5 with  , gray foam over dorsum of hand and wrist, artiflex from hand to axilla, 1 6 cm bandage at hand, 1 8 cm bandage from wrist to axilla with x at antecubital fossa and 1 12 cm bandage from wrist to axilla    11/24/2022 MLD: Short neck, Lt inguinal nodes, Lt axillo-inguinal anastomosis, avoiding radiation field, L UE working proximal to distal then retracing all steps. Did not work on lateral trunk due to compromised skin from radiation Compression Bandaging: TG soft from hand to axilla,  mollelast to digits 1-5  with  , gray foam over dorsum of hand and wrist, artiflex from hand to axilla, 1 6 cm bandage at hand, 1 8 cm bandage from wrist to axilla with x at antecubital fossa and 1  12 cm bandage from wrist to axilla                                                                                                                                    PATIENT EDUCATION:  Education details: lymphedema, anatomy and physiology of the lymphatic system, how lymphedema is treated, subclinical lymphedema vs lymphedema, need for compression bra Person educated: Patient Education method: Explanation Education comprehension: verbalized understanding  HOME EXERCISE PROGRAM: Obtain compression bra, continue to wear sleeve and glove  ASSESSMENT:  CLINICAL IMPRESSION: We are holding on bandaging now due to armpit and lateral trunk sensitivity, but she is wearing she garments and doing well.  She will let us know next week if she would like to schedule more out but I would anticipate her doing some more.   OBJECTIVE IMPAIRMENTS: decreased knowledge of condition, decreased ROM, decreased strength, increased edema, increased fascial restrictions, impaired UE functional use, postural dysfunction, and pain.   ACTIVITY LIMITATIONS: carrying, lifting, and reach over head  PARTICIPATION LIMITATIONS: cleaning, laundry, community activity, occupation, and yard work  PERSONAL FACTORS: Time since onset of injury/illness/exacerbation are also affecting patient's functional outcome.   REHAB POTENTIAL: Good  CLINICAL DECISION MAKING: Stable/uncomplicated  EVALUATION COMPLEXITY: Low  GOALS: Goals reviewed with patient? Yes   SHORT TERM GOALS = LONG TERM GOALS: Target date: 11/09/22  Pt will be independent in self MLD for long term management of lymphedema.  Baseline:  Goal status: IN PROGRESS 11/10/22- pt has been practicing but does not feel completely independent  2.  Pt will obtain appropriate compression garments for long term management of lymphedema.  Baseline:  Goal status: IN PROGRESS  3.  Pt will demonstrate 160 degrees of L shoulder flexion to allow her to reach  overhead. Baseline:  Goal status: IN PROGRESS 11/10/22 - 150  4. Pt will demonstrate 170 degrees of L shoulder abduction to allow her to reach out to the side.   Goal: IN PROGRESS 11/10/22 - 160  5. Pt will be independent in a home exercise program for continued stretching and strengthening.  Goal: IN PROGRESS 11/10/22  6. Pt will report she is able to raise her L arm to end range wihtout increased pain in her axilla to allow improved comfort.   GOAL: IN PROGRESS - pt still having pain when L UE ROM 11/10/22   PLAN:  PT FREQUENCY: 2x/week  PT DURATION: 4 weeks  PLANNED INTERVENTIONS: Therapeutic exercises, Therapeutic activity, Patient/Family education, Self Care, Joint mobilization, Manual lymph drainage, Compression bandaging, scar mobilization, Taping, Vasopneumatic device, and Manual therapy  PLAN FOR NEXT SESSION:pt to be measured on 11/23/22 by cassidy for custom day/night garments; cont MLD to L UE and  lateral trunk and instructing pt, PROM for L UE and STM to pect tendon  Stark Bray, PT 12/01/2022, 10:46 AM

## 2022-12-02 ENCOUNTER — Encounter: Payer: Self-pay | Admitting: Hematology and Oncology

## 2022-12-02 NOTE — Progress Notes (Signed)
Spoke to pt thru [employer]man (registration) because pt only speaks Spanish, regarding reapplying for the [employer].  Pt isn't receiving income at the moment so she will provide a letter of support today.  Once received I will approve her for a 2nd grant.

## 2022-12-02 NOTE — Progress Notes (Signed)
Pt is approved for a 2nd $1000 [employer] account executive.

## 2022-12-06 ENCOUNTER — Encounter: Payer: Self-pay | Admitting: Physical Therapy

## 2022-12-06 ENCOUNTER — Ambulatory Visit (INDEPENDENT_AMBULATORY_CARE_PROVIDER_SITE_OTHER): Payer: No Typology Code available for payment source | Admitting: Podiatry

## 2022-12-06 ENCOUNTER — Encounter: Payer: Self-pay | Admitting: Hematology and Oncology

## 2022-12-06 ENCOUNTER — Ambulatory Visit: Payer: No Typology Code available for payment source | Admitting: Physical Therapy

## 2022-12-06 ENCOUNTER — Other Ambulatory Visit (HOSPITAL_COMMUNITY): Payer: Self-pay

## 2022-12-06 DIAGNOSIS — L603 Nail dystrophy: Secondary | ICD-10-CM

## 2022-12-06 DIAGNOSIS — R293 Abnormal posture: Secondary | ICD-10-CM

## 2022-12-06 DIAGNOSIS — Z483 Aftercare following surgery for neoplasm: Secondary | ICD-10-CM

## 2022-12-06 DIAGNOSIS — Z170 Estrogen receptor positive status [ER+]: Secondary | ICD-10-CM

## 2022-12-06 DIAGNOSIS — M25612 Stiffness of left shoulder, not elsewhere classified: Secondary | ICD-10-CM

## 2022-12-06 DIAGNOSIS — I972 Postmastectomy lymphedema syndrome: Secondary | ICD-10-CM

## 2022-12-06 MED ORDER — CAPECITABINE 500 MG PO TABS
1000.0000 mg/m2 | ORAL_TABLET | Freq: Two times a day (BID) | ORAL | 5 refills | Status: DC
  Filled 2022-12-06: qty 112, 21d supply, fill #0
  Filled 2022-12-06: qty 112, 14d supply, fill #0
  Filled 2022-12-22: qty 112, 21d supply, fill #1
  Filled 2023-01-11: qty 112, 21d supply, fill #2

## 2022-12-06 NOTE — Therapy (Signed)
OUTPATIENT PHYSICAL THERAPY ONCOLOGY TREATMENT  Patient Name: [name] MRN: [id number] DOB:03/22/1976, 47 y.o., female Today's Date: 12/06/2022  END OF SESSION:  PT End of Session - 12/06/22 0959     Visit Number 17    Number of Visits 18    Date for PT Re-Evaluation 12/08/22    Authorization Type CAFA expires 7/28    PT Start Time 0905    PT Stop Time 0958    PT Time Calculation (min) 53 min    Activity Tolerance Patient tolerated treatment well    Behavior During Therapy [facility] for tasks assessed/performed                    Past Medical History:  Diagnosis Date   Allergy    Asthma    Breast cancer in female [facility])    Left   Breast disorder    breast cancer May 2021   Chronic back pain    Headache    High triglycerides    History of breast cancer    History of COVID-19 04/20/2021   Hx of migraines    Morbid obesity ([place])    Personal history of chemotherapy    Personal history of radiation therapy    Past Surgical History:  Procedure Laterality Date   BREAST LUMPECTOMY     BREAST LUMPECTOMY WITH RADIOACTIVE SEED AND SENTINEL LYMPH NODE BIOPSY Left 07/15/2020   Procedure: LEFT BREAST LUMPECTOMY WITH RADIOACTIVE SEED AND LEFT AXILLARY SENTINEL LYMPH NODE BIOPSY, LEFT AXILLARY NODE RADIOACTIVE SEED GUIDED EXCISION, LEFT BREAST RADIOACTIVE SEED GUIDED EXCISION IM NODE;  Surgeon: Rolm Bookbinder, MD;  Location: [facility];  Service: General;  Laterality: Left;  PEC BLOCK   CESAREAN SECTION WITH BILATERAL TUBAL LIGATION Bilateral 07/23/2015   Procedure: CESAREAN SECTION WITH BILATERAL TUBAL LIGATION;  Surgeon: Donnamae Jude, MD;  Location: [facility];  Service: Obstetrics;  Laterality: Bilateral;   IR IMAGING GUIDED PORT INSERTION  02/11/2022   MODIFIED MASTECTOMY Left 08/22/2022   Procedure: LEFT MODIFIED RADICAL MASTECTOMY, LEFT AXILLARY NODE DISSECTION, AND SKIN BIOPSY OF BACK;  Surgeon: Rolm Bookbinder, MD;  Location: [facility];  Service: General;   Laterality: Left;   PORT-A-CATH REMOVAL N/A 04/30/2021   Procedure: REMOVAL PORT-A-CATH;  Surgeon: Rolm Bookbinder, MD;  Location: [facility];  Service: General;  Laterality: N/A;   PORTACATH PLACEMENT Right 02/11/2020   Procedure: INSERTION PORT-A-CATH WITH ULTRASOUND GUIDANCE;  Surgeon: Rolm Bookbinder, MD;  Location: [facility];  Service: General;  Laterality: Right;   TUBAL LIGATION     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[place]) 04/01/2022   Encounter for dental examination 03/11/2022   Acute apical periodontitis 03/11/2022   Phobia of dental procedure 03/11/2022   Defective dental restoration 03/11/2022   Accretions on teeth 03/11/2022   Chronic periodontitis 03/11/2022   Teeth missing 03/11/2022   Caries 03/11/2022   Generalized gingival recession 03/11/2022   Pain, dental 03/08/2022   Cancer-related pain 03/08/2022   Port-A-Cath in place 02/15/2022   Oral allergy syndrome, subsequent encounter 02/03/2022   Adverse effect of other drugs, medicaments and biological substances, subsequent encounter 02/02/2022   Other adverse food reactions, not elsewhere classified, subsequent encounter 02/02/2022   Chest tightness 02/02/2022  Other allergic rhinitis 02/02/2022   Allergic conjunctivitis of both eyes 02/02/2022   Language barrier 12/11/2020   Morbid obesity ([place])    History of breast cancer    Genetic testing 02/13/2020   Malignant neoplasm of upper-outer quadrant of left breast in female, estrogen receptor positive ([place]) 01/27/2020   Impaired ability to use community resources due to language barrier 07/13/2013    PCP: Lazaro Arms, NP  REFERRING PROVIDER: Benay Pike, MD  REFERRING DIAG: Left  UE lymphedema  THERAPY DIAG:  Postmastectomy lymphedema  Stiffness of left shoulder, not elsewhere classified  Aftercare following surgery for neoplasm  Abnormal posture  Malignant neoplasm of upper-outer quadrant of left breast in female, estrogen receptor positive ([place])  ONSET DATE: 08/01/22  Rationale for Evaluation and Treatment: Rehabilitation  SUBJECTIVE:                                                                                                                                                                                           SUBJECTIVE STATEMENT:  The side is doing better today. We can bandage today.   PERTINENT HISTORY:  Patient was diagnosed on 01/23/2020 with left grade III invasive ductal carcinoma breast cancer. It is ER positive, PR negative, and HER2 negative , but functionally Triple Negative,with a Ki67 of 40%. Patient with interpreter present. She underwent neoadjuvant chemotherapy from 02/12/2020 - 05/28/2020. 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 She underwent a left lumpectomy and sentinel node biopsy ( 4 negative nodes) on 07/15/2020. She underwent radiation and anti-estrogen therapy.  She had interval development of diffuse skin thickening in 2023. She had a punch biopsy of her skin at that time and this was consistent with inflammatory breast cancer. She has been treated with chemo since then. She had a left modified radical mastectomy on 08/22/2022 with complete therapeutic response and 0/1 LN. She noted some swelling in her left arm and on 08/25/2022 she had an [place] that was negative for DVT. Her SOZO screen on 09/19/2021 was 6.9 points above baseline putting her in the yellow zone and she was given a compression sleeve.  PAIN:  Are you having pain? No   PRECAUTIONS: Other: lymphedema [name]  WEIGHT BEARING RESTRICTIONS: No  FALLS:  Has patient fallen in last 6 months? 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  LEISURE: pt does not exercise  HAND DOMINANCE: right   PRIOR LEVEL OF FUNCTION: Independent  PATIENT GOALS: get the swelling down, improve L shoulder ROM, decrease pain   OBJECTIVE:  COGNITION: Overall cognitive status: Within functional limits for tasks assessed   PALPATION: Decreased scar mobility  OBSERVATIONS / OTHER ASSESSMENTS: scar well healed, increased edema at lateral trunk and throughout [name]  POSTURE: forward head, rounded shoulders  UPPER EXTREMITY AROM/PROM:  A/PROM RIGHT   eval   Shoulder extension 68  Shoulder flexion 160  Shoulder abduction 175  Shoulder internal rotation 56  Shoulder external rotation 88    (Blank rows = not tested)  A/PROM LEFT   eval LEFT 11/10/22 LEFT 12/06/22  Shoulder extension 52    Shoulder flexion 146 150 156  Shoulder abduction 148 160 164  Shoulder internal rotation 67    Shoulder external rotation 86      (Blank rows = not tested)   LYMPHEDEMA ASSESSMENTS:  SURGERY TYPE/DATE: Left Lumpectomy with SLNB11/11/2019, Left modified mastectomy with left axillary node dissection NUMBER OF LYMPH NODES REMOVED: 0/4 with lumpectomy, 0/1 during Mastectomy CHEMOTHERAPY: yes RADIATION:Yes HORMONE TREATMENT: may require INFECTIONS: none  LYMPHEDEMA ASSESSMENTS:  LANDMARK RIGHT  eval RIGHT 11/08/22  10 cm proximal to olecranon process 31.4 34.4  Olecranon process 27.2 28  10  cm proximal to ulnar styloid process 25.7 25.5  Just proximal to ulnar styloid process 18.4 19  Across hand at thumb web space 20 20  At base of 2nd digit 6.4 6.5  (Blank rows = not tested)  LANDMARK LEFT  eval LEFT 10/25/22 Left  11/03/22 LEFT 11/08/22   10 cm proximal to olecranon process 33 33 31.9 33  Olecranon process 28.4 28.5 28.5 29  10  cm proximal to ulnar styloid process 26 26.5 26.4 26.6  Just proximal to ulnar styloid process 19.4 19 18.9 19.1  Across hand at thumb web space 21 20.5 19.7 20.7  2nd digit    6.7   TODAY'S TREATMENT:   12/06/2022 MLD: Short neck, L 5 diaphragmatic breaths, R axillary nodes and establishment of interaxillary pathway, Lt inguinal nodes, Lt axillo-inguinal anastomosis avoiding radiation field, L UE working proximal to distal, then retracing all steps.  PROM to L shoulder gentle in to flexion and abduction making sure to avoid any pulling at fragile skin Compression Bandaging: TG soft from hand to axilla,  mollelast to digits 1-4 with  , artiflex from hand to axilla, 1 6 cm bandage at hand, 1 8 cm bandage from wrist to axilla in herringbone fashion and 1 12 cm bandage from wrist to axilla  12/01/2022 MLD: Short neck, L 5 diaphragmatic breaths, R axillary nodes and establishment of interaxillary pathway, Lt inguinal nodes, Lt axillo-inguinal anastomosis avoiding radiation field, L UE working proximal to distal, then to R s/l to work on L lateral trunk moving fluid towards pathways then back to supine retracing all steps.  PROM to L shoulder gentle in to flexion and abduction making sure to avoid any pulling at fragile skin  11/29/2022 MLD: Short neck, L 5 diaphragmatic breaths, R axillary nodes and establishment of interaxillary pathway, Lt inguinal nodes, Lt axillo-inguinal anastomosis avoiding radiation field, L UE working proximal to distal, then to R s/l to work on L lateral trunk moving fluid towards pathways then back to supine retracing all steps.  PROM to L shoulder gentle in to flexion and abduction making sure to avoid any pulling at fragile skin  Compression Bandaging: TG soft from hand to axilla,  mollelast to digits 1-5 with  , gray foam over dorsum of hand and wrist, artiflex from hand to axilla, 1  6 cm bandage at hand, 1 8 cm bandage from wrist to axilla with x at antecubital fossa and 1 12 cm bandage from wrist to axilla    11/24/2022 MLD: Short neck, Lt inguinal nodes, Lt axillo-inguinal anastomosis, avoiding radiation field, L UE working proximal to distal then retracing all steps. Did not work on lateral trunk due to compromised skin from radiation Compression Bandaging: TG soft from hand to axilla,  mollelast to digits 1-5 with  , gray foam over dorsum of hand and wrist, artiflex from hand to axilla, 1 6 cm bandage at hand, 1 8 cm bandage from wrist to axilla with x at antecubital fossa and 1 12 cm bandage from wrist to axilla                                                                                                                                    PATIENT EDUCATION:  Education details: lymphedema, anatomy and physiology of the lymphatic system, how lymphedema is treated, subclinical lymphedema vs lymphedema, need for compression bra Person educated: Patient Education method: Explanation Education comprehension: verbalized understanding  HOME EXERCISE PROGRAM: Obtain compression bra, continue to wear sleeve and glove  ASSESSMENT:  CLINICAL IMPRESSION: Remeasured pt's ROM today and it has improved a few degrees despite recently completing radiation. She still has considerable swelling at lateral trunk but her skin is still fragile in this area so MLD was limited by this. Focused on MLD and PROM to [name] followed by compression bandaging.   OBJECTIVE IMPAIRMENTS: decreased knowledge of condition, decreased ROM, decreased strength, increased edema, increased fascial restrictions, impaired UE functional use, postural dysfunction, and pain.   ACTIVITY LIMITATIONS: carrying, lifting, and reach over head  PARTICIPATION LIMITATIONS: cleaning, laundry, community activity, occupation, and yard work  PERSONAL FACTORS: Time since onset of injury/illness/exacerbation are also affecting  patient's functional outcome.   REHAB POTENTIAL: Good  CLINICAL DECISION MAKING: Stable/uncomplicated  EVALUATION COMPLEXITY: Low  GOALS: Goals reviewed with patient? Yes   SHORT TERM GOALS = LONG TERM GOALS: Target date: 11/09/22  Pt will be independent in self MLD for long term management of lymphedema.  Baseline:  Goal status: IN PROGRESS 11/10/22- pt has been practicing but does not feel completely independent  2.  Pt will obtain appropriate compression garments for long term management of lymphedema.  Baseline:  Goal status: IN PROGRESS  3.  Pt will demonstrate 160 degrees of L shoulder flexion to allow her to reach overhead. Baseline:  Goal status: IN PROGRESS 11/10/22 - 150  4. Pt will demonstrate 170 degrees of L shoulder abduction to allow her to reach out to the side.   Goal: IN PROGRESS 11/10/22 - 160  5. Pt will be independent in a home exercise program  for continued stretching and strengthening.  Goal: IN PROGRESS 11/10/22  6. Pt will report she is able to raise her L arm to end range wihtout increased pain in her axilla to allow improved comfort.   GOAL: IN PROGRESS - pt still having pain when L UE ROM 11/10/22   PLAN:  PT FREQUENCY: 2x/week  PT DURATION: 4 weeks  PLANNED INTERVENTIONS: Therapeutic exercises, Therapeutic activity, Patient/Family education, Self Care, Joint mobilization, Manual lymph drainage, Compression bandaging, scar mobilization, Taping, Vasopneumatic device, and Manual therapy  PLAN FOR NEXT SESSION:pt to be measured on 11/23/22 by cassidy for custom day/night garments; reached out to [facility] to find out status of demo - pt was supposed to have been called but has not heard anything, updated goals and recert to continue therapy - cont MLD to L UE and lateral trunk and instructing pt, PROM for L UE and STM to pect tendon  [employer], PT 12/06/2022, 10:05 AM

## 2022-12-06 NOTE — Progress Notes (Signed)
Chief Complaint  Patient presents with   Ingrown Toenail    Patient came in today for bilateral hallux ingrown toenails, patient would like to remove the total nail today     HPI: 47 y.o. female presenting today as a new patient for evaluation of pain and tenderness associated to the bilateral toenails.  Patient states that she has had cancer and undergoing chemotherapy.  At that time she noticed discoloration with thickening and dystrophy to the nail plates.  She says about 1 year ago the toenails actually fell off and regrew.  They are very thick and tender.  She is requesting to have the toenails removed today because of the pain and sensitivity.  Past Medical History:  Diagnosis Date   Allergy    Asthma    Breast cancer in female [facility])    Left   Breast disorder    breast cancer May 2021   Chronic back pain    Headache    High triglycerides    History of breast cancer    History of COVID-19 04/20/2021   Hx of migraines    Morbid obesity ([place])    Personal history of chemotherapy    Personal history of radiation therapy     Past Surgical History:  Procedure Laterality Date   BREAST LUMPECTOMY     BREAST LUMPECTOMY WITH RADIOACTIVE SEED AND SENTINEL LYMPH NODE BIOPSY Left 07/15/2020   Procedure: LEFT BREAST LUMPECTOMY WITH RADIOACTIVE SEED AND LEFT AXILLARY SENTINEL LYMPH NODE BIOPSY, LEFT AXILLARY NODE RADIOACTIVE SEED GUIDED EXCISION, LEFT BREAST RADIOACTIVE SEED GUIDED EXCISION IM NODE;  Surgeon: Rolm Bookbinder, MD;  Location: [facility];  Service: General;  Laterality: Left;  PEC BLOCK   CESAREAN SECTION WITH BILATERAL TUBAL LIGATION Bilateral 07/23/2015   Procedure: CESAREAN SECTION WITH BILATERAL TUBAL LIGATION;  Surgeon: Donnamae Jude, MD;  Location: [facility];  Service: Obstetrics;  Laterality: Bilateral;   IR IMAGING GUIDED PORT INSERTION  02/11/2022   MODIFIED MASTECTOMY Left 08/22/2022   Procedure: LEFT MODIFIED RADICAL MASTECTOMY, LEFT AXILLARY NODE DISSECTION, AND SKIN  BIOPSY OF BACK;  Surgeon: Rolm Bookbinder, MD;  [facility];  Service: General;  Laterality: Left;   PORT-A-CATH REMOVAL N/A 04/30/2021   Procedure: REMOVAL PORT-A-CATH;  Surgeon: Rolm Bookbinder, MD;  Location: [facility];  Service: General;  Laterality: N/A;   PORTACATH PLACEMENT Right 02/11/2020   Procedure: INSERTION PORT-A-CATH WITH ULTRASOUND GUIDANCE;  Surgeon: Rolm Bookbinder, MD;  Location:  [facility];  Service: General;  Laterality: Right;   TUBAL LIGATION      Allergies  Allergen Reactions   Omnipaque [Iohexol] Other (See Comments)    Throat itching, some SOB and some chest pain post administration    Doxorubicin Hcl Rash    Experience chest tightness, abdominal pain, and redness around lips during and shortly after doxorubicin bolus.    Keytruda [Pembrolizumab] Other (See Comments)    Lung irritation; skin breakout; swelling   Other Itching    Almonds, cherries, peaches   Tape     Redness      Physical Exam: General: The patient is alert and oriented x3 in no acute distress.  Dermatology: Hyperkeratotic dystrophic nails noted to the bilateral great toes apparently secondary to the chemotherapy.  There is some associated tenderness with palpation  Vascular: Palpable pedal pulses bilaterally. Capillary refill within normal limits.  Negative for any significant edema or erythema  Neurological: Light touch and protective threshold grossly intact  Musculoskeletal Exam: No pedal deformities  noted   Assessment: 1.  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  The patient would like to have the toenail is avulsed and allowing the nails to grow in with the potential for healthy regrowth.  I explained that the chances of healthy  regrowth are small but she would like to pursue this option anyways. -The toes were prepped in aseptic manner and digital block performed using 3 mL of 2% lidocaine plain.  The nails were avulsed in their entirety and dressings applied.  Post care instructions provided -Return to clinic as needed      Edrick Kins, DPM [facility]  Dr. Edrick Kins, DPM    [address], [place] [address]                Office [phone]  Fax [phone]

## 2022-12-06 NOTE — Patient Instructions (Signed)
Instrucciones de remojo  El dia despues del procedimiento:  Coloque 1/4 taza de sal de epsom en un litro de agua tibia del grifo. Sumerja su pie o pies con el vendaje externo intacto para el remojo inicial; esto permitira que el vendaje se humdezca y humedezca  para despegarlo facilmente. Una ves que retire su vendaje, continue remojando la solucion durante 20 minutos. Este remojo deber hacerse dos veces al dia. Luego, retire su pie o pies de la solucion, seque el area afectada y cubra. Puede usar una curita lo suficientemente grande como para cubrir el area o usar una gasa y cinta adhesive. Aplique otros medicamentos en el area segun las indicaciones del medico, como la polisporina neosporina.    SI SU PIEL SE IRRITA AL USAR ESTAS INSTRUCCIONES, ES ACEPTABLE CAMBIARSE AL VINAGRE BLANCO Y AL AGUA. O puede usar agua y jabon antibacterial para mantener limpio el dedo del pie.   Monitoree cualquier signo/sintoma de infeccion. Llame a la oficina de inmediato si occure o vaya directament a la sal de emergencias. Llame con cualquier pregunta/inquitud.  Instrucciones de cuidado a largo plazco: cirugia post clavo; Le han tratado la una encarnada y la raiz con un quimico. Este quimico causa una quemadura que drenara y supurara como una ampolla. Esto puede drenar durante 6-8 semana o mas. Es importante mantener esta area limpia, cubierta y seguir las intrucciones de remojo distribuidas al momento de la cirugia. Esta area finalmente se secara y formara una costra. Una ves que se forma la costra, ya no necesita remojar o aplicar un aposito. Si en algun momento experimenta un aumento en el dolor, enrojecimiento, hinchazon o drenaje, debe comunicarse con la oficina lo antes posible.   

## 2022-12-06 NOTE — Telephone Encounter (Signed)
Oral Chemotherapy Pharmacist Encounter  I spoke with patient for overview of: Xeloda (capecitabine) for the  treatment of adjuvant therapy of triple negative breast cancer, planned duration until disease progression or unacceptable drug toxicity or for a total of 6-8 cycles.  Counseled patient on administration, dosing, side effects, monitoring, drug-food interactions, safe handling, storage, and disposal.  Patient will take Xeloda 500mg  tablets, 4 tablets (2000mg ) by mouth in AM and 4 tabs (2000mg ) by mouth in PM, within 30 minutes of finishing meals, for 14 days on, 7 days off, repeated every 21 days.  Xeloda start date: 12/09/2022  Adverse effects include but are not limited to: fatigue, decreased blood counts, GI upset, diarrhea, mouth sores, and hand-foot syndrome.   Patient has anti-emetic on hand and knows to take it if nausea develops.   Patient will obtain anti diarrheal and alert the office of 4 or more loose stools above baseline.  Reviewed with patient importance of keeping a medication schedule and plan for any missed doses. No barriers to medication adherence identified.  Medication reconciliation performed and medication/allergy list updated.  This will ship from the [facility] on 12/06/22 to deliver to patient's home on 12/07/22.  Patient informed the pharmacy will reach out 5-7 days prior to needing next fill of Xeloda to coordinate continued medication acquisition to prevent break in therapy.  All questions answered.  Patient voiced understanding and appreciation.   Medication education handout placed in mail for patient. Patient knows to call the office with questions or concerns. Oral Chemotherapy Clinic phone number provided to patient.   Drema Halon, PharmD Hematology/Oncology Clinical Pharmacist [facility] [phone]

## 2022-12-08 ENCOUNTER — Ambulatory Visit: Payer: No Typology Code available for payment source | Admitting: Physical Therapy

## 2022-12-08 DIAGNOSIS — I972 Postmastectomy lymphedema syndrome: Secondary | ICD-10-CM

## 2022-12-08 DIAGNOSIS — M25612 Stiffness of left shoulder, not elsewhere classified: Secondary | ICD-10-CM

## 2022-12-08 DIAGNOSIS — M546 Pain in thoracic spine: Secondary | ICD-10-CM

## 2022-12-08 DIAGNOSIS — M6281 Muscle weakness (generalized): Secondary | ICD-10-CM

## 2022-12-08 DIAGNOSIS — R293 Abnormal posture: Secondary | ICD-10-CM

## 2022-12-08 DIAGNOSIS — Z170 Estrogen receptor positive status [ER+]: Secondary | ICD-10-CM

## 2022-12-08 DIAGNOSIS — Z483 Aftercare following surgery for neoplasm: Secondary | ICD-10-CM

## 2022-12-08 DIAGNOSIS — M5459 Other low back pain: Secondary | ICD-10-CM

## 2022-12-08 NOTE — Therapy (Signed)
OUTPATIENT PHYSICAL THERAPY ONCOLOGY TREATMENT  Patient Name: [name] MRN: [id number] DOB:1976/05/02, 47 y.o., female Today's Date: 12/08/2022  END OF SESSION:  PT End of Session - 12/08/22 0955     Visit Number 18    Number of Visits 35    Date for PT Re-Evaluation 02/07/23    PT Start Time 0900    PT Stop Time 0956    PT Time Calculation (min) 56 min    Activity Tolerance Patient tolerated treatment well    Behavior During Therapy [facility] for tasks assessed/performed                     Past Medical History:  Diagnosis Date   Allergy    Asthma    Breast cancer in female [facility])    Left   Breast disorder    breast cancer May 2021   Chronic back pain    Headache    High triglycerides    History of breast cancer    History of COVID-19 04/20/2021   Hx of migraines    Morbid obesity ([place])    Personal history of chemotherapy    Personal history of radiation therapy    Past Surgical History:  Procedure Laterality Date   BREAST LUMPECTOMY     BREAST LUMPECTOMY WITH RADIOACTIVE SEED AND SENTINEL LYMPH NODE BIOPSY Left 07/15/2020   Procedure: LEFT BREAST LUMPECTOMY WITH RADIOACTIVE SEED AND LEFT AXILLARY SENTINEL LYMPH NODE BIOPSY, LEFT AXILLARY NODE RADIOACTIVE SEED GUIDED EXCISION, LEFT BREAST RADIOACTIVE SEED GUIDED EXCISION IM NODE;  Surgeon: Rolm Bookbinder, MD;  Location: [place];  Service: General;  Laterality: Left;  PEC BLOCK   CESAREAN SECTION WITH BILATERAL TUBAL LIGATION Bilateral 07/23/2015   Procedure: CESAREAN SECTION WITH BILATERAL TUBAL LIGATION;  Surgeon: Donnamae Jude, MD;  Location: [facility];  Service: Obstetrics;  Laterality: Bilateral;   IR IMAGING GUIDED PORT INSERTION  02/11/2022   MODIFIED MASTECTOMY Left 08/22/2022   Procedure: LEFT MODIFIED RADICAL MASTECTOMY, LEFT AXILLARY NODE DISSECTION, AND SKIN BIOPSY OF BACK;  Surgeon: Rolm Bookbinder, MD;  Location: [facility];  Service: General;  Laterality: Left;   PORT-A-CATH REMOVAL N/A  04/30/2021   Procedure: REMOVAL PORT-A-CATH;  Surgeon: Rolm Bookbinder, MD;  Location: [place];  Service: General;  Laterality: N/A;   PORTACATH PLACEMENT Right 02/11/2020   Procedure: INSERTION PORT-A-CATH WITH ULTRASOUND GUIDANCE;  Surgeon: Rolm Bookbinder, MD;  Location: [place];  Service: General;  Laterality: Right;   TUBAL LIGATION     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[place]) 04/01/2022   Encounter for dental examination 03/11/2022   Acute apical periodontitis 03/11/2022   Phobia of dental procedure 03/11/2022   Defective dental restoration 03/11/2022   Accretions on teeth 03/11/2022   Chronic periodontitis 03/11/2022   Teeth missing 03/11/2022   Caries 03/11/2022   Generalized gingival recession 03/11/2022   Pain, dental 03/08/2022   Cancer-related pain 03/08/2022   Port-A-Cath in place 02/15/2022   Oral allergy syndrome, subsequent encounter 02/03/2022   Adverse effect of other drugs, medicaments and biological substances, subsequent encounter 02/02/2022   Other adverse food reactions, not elsewhere classified, subsequent encounter 02/02/2022   Chest tightness 02/02/2022   Other allergic rhinitis 02/02/2022  Allergic conjunctivitis of both eyes 02/02/2022   Language barrier 12/11/2020   Morbid obesity ([place])    History of breast cancer    Genetic testing 02/13/2020   Malignant neoplasm of upper-outer quadrant of left breast in female, estrogen receptor positive ([place]) 01/27/2020   Impaired ability to use community resources due to language barrier 07/13/2013    PCP: Lazaro Arms, NP  REFERRING PROVIDER: Benay Pike, MD  REFERRING DIAG: Left UE lymphedema  THERAPY DIAG:   Postmastectomy lymphedema  Stiffness of left shoulder, not elsewhere classified  Aftercare following surgery for neoplasm  Abnormal posture  Malignant neoplasm of upper-outer quadrant of left breast in female, estrogen receptor positive ([place])  Pain in thoracic spine  Muscle weakness (generalized)  Other low back pain  ONSET DATE: 08/01/22  Rationale for Evaluation and Treatment: Rehabilitation  SUBJECTIVE:                                                                                                                                                                                           SUBJECTIVE STATEMENT:  PT with interpreter Caro Laroche   Pt had both great toenails removed yesterday and she is having some pain with that . She took some tylenol. She thinks she will start chemotherapy with pills tomorrow. She says she has been bandaging at home and wearing the sleeve, but the swelling still comes back. She has had flat knit sleeve and glove and nighttime garment on order . She is still having soreness in her left lateral chest    PERTINENT HISTORY:  Patient was diagnosed on 01/23/2020 with left grade III invasive ductal carcinoma breast cancer. It is ER positive, PR negative, and HER2 negative , but functionally Triple Negative,with a Ki67 of 40%. Patient with interpreter present. She underwent neoadjuvant chemotherapy from 02/12/2020 - 05/28/2020. 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 She underwent a left lumpectomy and sentinel node biopsy ( 4 negative nodes) on 07/15/2020. She underwent radiation and anti-estrogen therapy.  She had interval development of diffuse skin thickening in 2023. She had a punch biopsy of her skin at that time and this was consistent with inflammatory breast cancer. She has been treated with chemo since then. She had a left modified radical mastectomy on 08/22/2022 with complete therapeutic response and 0/1 LN. She noted some swelling  in her left arm and on 08/25/2022 she had an [place] that was negative for DVT. Her SOZO screen on 09/19/2021 was 6.9 points above baseline putting her in the yellow zone and she was given a compression sleeve that was not containing  her so a flat  knit sleeve and glove and nighttime garment  have been ordered   PAIN:  Are you having pain? Not in arm, just her toes   PRECAUTIONS: Other: lymphedema [name]  WEIGHT BEARING RESTRICTIONS: No  FALLS:  Has patient fallen in last 6 months? No  LIVING ENVIRONMENT: Lives with:  daughters aged 49, 22 and 43 Lives in: House/apartment Stairs: No;  Has following equipment at home: None  OCCUPATION: not currently working, since Feb 2021 stopped working due to number of appts, plans to return to work; previously worked Dentist at [employer] but will plan to return to some sort of [place] work  LEISURE: pt does not exercise  HAND DOMINANCE: right   PRIOR LEVEL OF FUNCTION: Independent  PATIENT GOALS: get the swelling down, improve L shoulder ROM, decrease pain   OBJECTIVE:  COGNITION: Overall cognitive status: Within functional limits for tasks assessed   PALPATION: Decreased scar mobility. Fullness in left lateral chest and back as well as left arm and hand especially on thumb side   OBSERVATIONS / OTHER ASSESSMENTS: scar well healed, increased edema at lateral trunk and throughout [name] reddened area in radiation field with skin appearing fragile and irritated but no open areas    POSTURE: forward head, rounded shoulders  UPPER EXTREMITY AROM/PROM:  A/PROM RIGHT   eval   Shoulder extension 68  Shoulder flexion 160  Shoulder abduction 175  Shoulder internal rotation 56  Shoulder external rotation 88    (Blank rows = not tested)  A/PROM LEFT   eval LEFT 11/10/22 LEFT 12/06/22 LEFT 12/08/2022  Shoulder extension 52     Shoulder flexion 146 150 156 155  Shoulder abduction 148 160 164 170  Shoulder internal rotation 67      Shoulder external rotation 86       (Blank rows = not tested)   LYMPHEDEMA ASSESSMENTS:  SURGERY TYPE/DATE: Left Lumpectomy with SLNB11/11/2019, Left modified mastectomy with left axillary node dissection NUMBER OF LYMPH NODES REMOVED: 0/4 with lumpectomy, 0/1 during Mastectomy CHEMOTHERAPY: yes RADIATION:Yes HORMONE TREATMENT: may require INFECTIONS: none  LYMPHEDEMA ASSESSMENTS:  LANDMARK RIGHT  eval RIGHT 11/08/22  10 cm proximal to olecranon process 31.4 34.4  Olecranon process 27.2 28  10  cm proximal to ulnar styloid process 25.7 25.5  Just proximal to ulnar styloid process 18.4 19  Across hand at thumb web space 20 20  At base of 2nd digit 6.4 6.5  (Blank rows = not tested)  LANDMARK LEFT  eval LEFT 10/25/22 Left  11/03/22 LEFT 11/08/22 LEFT  12/08/2022   10 cm proximal to olecranon process 33 33 31.9 33 34.3  Olecranon process 28.4 28.5 28.5 29 28.7  10 cm proximal to ulnar styloid process 26 26.5 26.4 26.6 26.5  Just proximal to ulnar styloid process 19.4 19 18.9 19.1 18.2  Across hand at thumb web space 21 20.5 19.7 20.7 19.5  2nd digit    6.7 6.5   TODAY'S TREATMENT:   3/282024  Remeasured shoulder ROM and circumferences for recertification. MLD: Short neck, L 5 diaphragmatic breaths, R axillary nodes and establishment of interaxillary pathway, Lt inguinal nodes, Lt axillo-inguinal anastomosis avoiding radiation field, L UE working proximal to distal, then retracing all steps. Then to sidelying for more prolonged work on Retail buyer. Instructed in supine dowel stretch in flexion and abduction making sure there is not pulling on reddened skin. In sidelying, small circles with hand pointed to ceiling,x 5  short range abduction abduction with straight arm and hand  on hip, shoulder flexion and external rotation. X 10   12/06/2022 MLD: Short neck, L 5 diaphragmatic breaths, R axillary nodes and establishment of interaxillary pathway, Lt inguinal nodes,  Lt axillo-inguinal anastomosis avoiding radiation field, L UE working proximal to distal, then retracing all steps.  PROM to L shoulder gentle in to flexion and abduction making sure to avoid any pulling at fragile skin Compression Bandaging: TG soft from hand to axilla,  mollelast to digits 1-4 with  , artiflex from hand to axilla, 1 6 cm bandage at hand, 1 8 cm bandage from wrist to axilla in herringbone fashion and 1 12 cm bandage from wrist to axilla  12/01/2022 MLD: Short neck, L 5 diaphragmatic breaths, R axillary nodes and establishment of interaxillary pathway, Lt inguinal nodes, Lt axillo-inguinal anastomosis avoiding radiation field, L UE working proximal to distal, then to R s/l to work on L lateral trunk moving fluid towards pathways then back to supine retracing all steps.  PROM to L shoulder gentle in to flexion and abduction making sure to avoid any pulling at fragile skin  11/29/2022 MLD: Short neck, L 5 diaphragmatic breaths, R axillary nodes and establishment of interaxillary pathway, Lt inguinal nodes, Lt axillo-inguinal anastomosis avoiding radiation field, L UE working proximal to distal, then to R s/l to work on L lateral trunk moving fluid towards pathways then back to supine retracing all steps.  PROM to L shoulder gentle in to flexion and abduction making sure to avoid any pulling at fragile skin Compression Bandaging: TG soft from hand to axilla,  mollelast to digits 1-5 with  , gray foam over dorsum of hand and wrist, artiflex from hand to axilla, 1 6 cm bandage at hand, 1 8 cm bandage from wrist to axilla with x at antecubital fossa and 1 12 cm bandage from wrist to axilla    11/24/2022 MLD: Short neck, Lt inguinal nodes, Lt axillo-inguinal anastomosis, avoiding radiation field, L UE working proximal to distal then retracing all steps. Did not work on lateral trunk due to compromised skin from radiation Compression Bandaging: TG soft from hand to axilla,  mollelast to digits  1-5 with  , gray foam over dorsum of hand and wrist, artiflex from hand to axilla, 1 6 cm bandage at hand, 1 8 cm bandage from wrist to axilla with x at antecubital fossa and 1 12 cm bandage from wrist to axilla                                                                                                                                    PATIENT EDUCATION:  Education details:supine dowel flexion and abduction, Person educated: Patient Education method: Mining engineer given  Education comprehension: verbalized understanding  HOME EXERCISE PROGRAM: Obtain compression bra, continue to wear sleeve and glove  ASSESSMENT:  CLINICAL IMPRESSION: Remeasured pt's ROM  and circumference for re certification  and it has improved  She  still has considerable swelling at lateral trunk but her skin is still fragile in this area so MLD was limited. . Focused on MLD across back and posterior shoulder and noticed palpable decrease with this . Pt will benefit from continued PT with MLD to this area 2x a week for one week and one time a week for the next 3 weeks to progress strengthening.  Pt expresses that she is having some  fatigue from so many appointments and she has demonstrated that is she able to follow through at home. Asked for extended PT length of treatment to allow time for garments to arrive and be checked.    OBJECTIVE IMPAIRMENTS: decreased knowledge of condition, decreased ROM, decreased strength, increased edema, increased fascial restrictions, impaired UE functional use, postural dysfunction, and pain.   ACTIVITY LIMITATIONS: carrying, lifting, and reach over head  PARTICIPATION LIMITATIONS: cleaning, laundry, community activity, occupation, and yard work  PERSONAL FACTORS: Time since onset of injury/illness/exacerbation are also affecting patient's functional outcome.   REHAB POTENTIAL: Good  CLINICAL DECISION MAKING: Stable/uncomplicated  EVALUATION COMPLEXITY:  Low  GOALS: Goals reviewed with patient? Yes   SHORT TERM GOALS = LONG TERM GOALS: Target date: 11/09/22  Pt will be independent in self MLD for long term management of lymphedema.  Baseline:  Goal status: IN PROGRESS   2.  Pt will obtain appropriate compression garments for long term management of lymphedema.  Baseline:  Goal status: IN PROGRESS  3.  Pt will demonstrate 160 degrees of L shoulder flexion to allow her to reach overhead. Baseline:  Goal status: IN PROGRESS 11/09/2022:  155  4. Pt will demonstrate 170 degrees of L shoulder abduction to allow her to reach out to the side.   Goal: IN PROGRESS 12/08/22 - 164  5. Pt will be independent in a home exercise program for continued stretching and strengthening.  Goal: IN PROGRESS   6. Pt will report she is able to raise her L arm to end range wihtout increased pain in her axilla to allow improved comfort.   GOAL: IN PROGRESS - pt still having pain   PLAN:  PT FREQUENCY:1- 2x/week  PT DURATION: 8 weeks   PLANNED INTERVENTIONS: Therapeutic exercises, Therapeutic activity, Patient/Family education, Self Care, Joint mobilization, Manual lymph drainage, Compression bandaging, scar mobilization, Taping, Vasopneumatic device, and Manual therapy  PLAN FOR NEXT SESSION:cont MLD to L UE and lateral trunk and, progress exercises to include stretching strengthening , make sure garments fit and pt is able to manage her self care at home prior to discharge   Norwood Levo, PT 12/08/2022, 9:57 [facility] [address] [place], [place], [address] Phone: [phone]   Fax:  [phone]

## 2022-12-08 NOTE — Patient Instructions (Signed)
SHOULDER: Flexion - Supine (Cane)        Cancer Rehab [phone]    Hold cane in both hands. Raise arms up overhead. Do not allow back to arch. Hold _5__ seconds. Do __5-10__ times; __1-2__ times a day.   SELF ASSISTED WITH OBJECT: Shoulder Abduction / Adduction - Supine    Hold cane with both hands. Move both arms from side to side, keep elbows straight.  Hold when stretch felt for __5__ seconds. Repeat __5-10__ times; __1-2__ times a day. Once this becomes easier progress to third picture bringing affected arm towards ear by staying out to side. Same hold for _5_seconds. Repeat  _5-10_ times, _1-2_ times/day.   Copyright  VHI. All rights reserved.       

## 2022-12-13 ENCOUNTER — Other Ambulatory Visit: Payer: Self-pay

## 2022-12-15 ENCOUNTER — Ambulatory Visit: Payer: No Typology Code available for payment source | Attending: Hematology and Oncology | Admitting: Rehabilitation

## 2022-12-15 ENCOUNTER — Encounter: Payer: Self-pay | Admitting: Rehabilitation

## 2022-12-15 DIAGNOSIS — C50412 Malignant neoplasm of upper-outer quadrant of left female breast: Secondary | ICD-10-CM | POA: Insufficient documentation

## 2022-12-15 DIAGNOSIS — M25612 Stiffness of left shoulder, not elsewhere classified: Secondary | ICD-10-CM | POA: Insufficient documentation

## 2022-12-15 DIAGNOSIS — R293 Abnormal posture: Secondary | ICD-10-CM | POA: Insufficient documentation

## 2022-12-15 DIAGNOSIS — M546 Pain in thoracic spine: Secondary | ICD-10-CM | POA: Insufficient documentation

## 2022-12-15 DIAGNOSIS — Z170 Estrogen receptor positive status [ER+]: Secondary | ICD-10-CM | POA: Insufficient documentation

## 2022-12-15 DIAGNOSIS — I972 Postmastectomy lymphedema syndrome: Secondary | ICD-10-CM | POA: Insufficient documentation

## 2022-12-15 DIAGNOSIS — Z483 Aftercare following surgery for neoplasm: Secondary | ICD-10-CM | POA: Insufficient documentation

## 2022-12-15 DIAGNOSIS — M6281 Muscle weakness (generalized): Secondary | ICD-10-CM | POA: Insufficient documentation

## 2022-12-15 NOTE — Therapy (Signed)
OUTPATIENT PHYSICAL THERAPY ONCOLOGY TREATMENT  Patient Name: [name] MRN: [id number] DOB:[birth year], 47 y.o., female Today's Date: 12/15/2022  END OF SESSION:  PT End of Session - 12/15/22 1058     Visit Number 19    Number of Visits 35    Date for PT Re-Evaluation 02/07/23    PT Start Time 1100    PT Stop Time 1155    PT Time Calculation (min) 55 min    Activity Tolerance Patient tolerated treatment well    Behavior During Therapy [facility] for tasks assessed/performed                      Past Medical History:  Diagnosis Date   Allergy    Asthma    Breast cancer in female    Left   Breast disorder    breast cancer May 2021   Chronic back pain    Headache    High triglycerides    History of breast cancer    History of COVID-19 04/20/2021   Hx of migraines    Morbid obesity    Personal history of chemotherapy    Personal history of radiation therapy    Past Surgical History:  Procedure Laterality Date   BREAST LUMPECTOMY     BREAST LUMPECTOMY WITH RADIOACTIVE SEED AND SENTINEL LYMPH NODE BIOPSY Left 07/15/2020   Procedure: LEFT BREAST LUMPECTOMY WITH RADIOACTIVE SEED AND LEFT AXILLARY SENTINEL LYMPH NODE BIOPSY, LEFT AXILLARY NODE RADIOACTIVE SEED GUIDED EXCISION, LEFT BREAST RADIOACTIVE SEED GUIDED EXCISION IM NODE;  Surgeon: Rolm Bookbinder, MD;  Location: [place];  Service: General;  Laterality: Left;  PEC BLOCK   CESAREAN SECTION WITH BILATERAL TUBAL LIGATION Bilateral 07/23/2015   Procedure: CESAREAN SECTION WITH BILATERAL TUBAL LIGATION;  Surgeon: Donnamae Jude, MD;  Location: [facility];  Service: Obstetrics;  Laterality: Bilateral;   IR IMAGING GUIDED PORT INSERTION  02/11/2022   MODIFIED MASTECTOMY Left 08/22/2022   Procedure: LEFT MODIFIED RADICAL MASTECTOMY, LEFT AXILLARY NODE DISSECTION, AND SKIN BIOPSY OF BACK;  Surgeon: Rolm Bookbinder, MD;  Location: [facility];  Service: General;  Laterality: Left;   PORT-A-CATH REMOVAL N/A 04/30/2021    Procedure: REMOVAL PORT-A-CATH;  Surgeon: Rolm Bookbinder, MD;  Location: [place];  Service: General;  Laterality: N/A;   PORTACATH PLACEMENT Right 02/11/2020   Procedure: INSERTION PORT-A-CATH WITH ULTRASOUND GUIDANCE;  Surgeon: Rolm Bookbinder, MD;  Location: [place];  Service: General;  Laterality: Right;   TUBAL LIGATION     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 04/01/2022   Encounter for dental examination 03/11/2022   Acute apical periodontitis 03/11/2022   Phobia of dental procedure 03/11/2022   Defective dental restoration 03/11/2022   Accretions on teeth 03/11/2022   Chronic periodontitis 03/11/2022   Teeth missing 03/11/2022   Caries 03/11/2022   Generalized gingival recession 03/11/2022   Pain, dental 03/08/2022   Cancer-related pain 03/08/2022   Port-A-Cath in place 02/15/2022   Oral allergy syndrome, subsequent encounter 02/03/2022   Adverse effect of other drugs, medicaments and biological substances, subsequent encounter 02/02/2022   Other adverse food reactions, not elsewhere classified, subsequent encounter 02/02/2022   Chest tightness 02/02/2022   Other allergic rhinitis 02/02/2022   Allergic conjunctivitis  of both eyes 02/02/2022   Language barrier 12/11/2020   Morbid obesity    History of breast cancer    Genetic testing 02/13/2020   Malignant neoplasm of upper-outer quadrant of left breast in female, estrogen receptor positive 01/27/2020   Impaired ability to use community resources due to language barrier 07/13/2013    PCP: Lazaro Arms, NP  REFERRING PROVIDER: Benay Pike, MD  REFERRING DIAG: Left UE lymphedema  THERAPY DIAG:  Postmastectomy lymphedema  Stiffness of  left shoulder, not elsewhere classified  Aftercare following surgery for neoplasm  Abnormal posture  Malignant neoplasm of upper-outer quadrant of left breast in female, estrogen receptor positive  ONSET DATE: 08/01/22  Rationale for Evaluation and Treatment: Rehabilitation  SUBJECTIVE:                                                                                                                                                                                           SUBJECTIVE STATEMENT:  Nothing new.    PERTINENT HISTORY:  Patient was diagnosed on 01/23/2020 with left grade III invasive ductal carcinoma breast cancer. It is ER positive, PR negative, and HER2 negative , but functionally Triple Negative,with a Ki67 of 40%. Patient with interpreter present. She underwent neoadjuvant chemotherapy from 02/12/2020 - 05/28/2020. 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 She underwent a left lumpectomy and sentinel node biopsy ( 4 negative nodes) on 07/15/2020. She underwent radiation and anti-estrogen therapy.  She had interval development of diffuse skin thickening in 2023. She had a punch biopsy of her skin at that time and this was consistent with inflammatory breast cancer. She has been treated with chemo since then. She had a left modified radical mastectomy on 08/22/2022 with complete therapeutic response and 0/1 LN. She noted some swelling in her left arm and on 08/25/2022 she had an [place] that was negative for DVT. Her SOZO screen on 09/19/2021 was 6.9 points above baseline putting her in the yellow zone and she was given a compression sleeve that was not containing  her so a flat knit sleeve and glove and nighttime garment  have been ordered   PAIN:  Are you having pain? Not in arm, just her toes   PRECAUTIONS: Other: lymphedema [name]  WEIGHT BEARING RESTRICTIONS: No  FALLS:  Has patient fallen in last 6 months? 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 Fullness in left lateral chest and back as well as left arm and hand especially on thumb side   OBSERVATIONS / OTHER ASSESSMENTS: scar well healed, increased edema at lateral trunk and throughout [name] reddened area in radiation field with skin appearing fragile and irritated but no open areas   POSTURE: forward head, rounded shoulders  UPPER EXTREMITY AROM/PROM:  A/PROM RIGHT   eval   Shoulder extension 68  Shoulder flexion 160  Shoulder abduction 175  Shoulder internal rotation 56  Shoulder external rotation 88    (Blank rows = not tested)  A/PROM LEFT   eval LEFT 11/10/22 LEFT 12/06/22 LEFT 12/08/2022  Shoulder extension 52     Shoulder flexion 146 150 156 155  Shoulder abduction 148 160 164 170  Shoulder internal rotation 67     Shoulder external rotation 86       (Blank rows = not tested)   LYMPHEDEMA ASSESSMENTS:  SURGERY TYPE/DATE: Left Lumpectomy with SLNB11/11/2019, Left modified mastectomy with left axillary node dissection NUMBER OF LYMPH NODES REMOVED: 0/4 with lumpectomy, 0/1 during Mastectomy CHEMOTHERAPY: yes RADIATION:Yes HORMONE TREATMENT: may require INFECTIONS: none  LYMPHEDEMA ASSESSMENTS:  LANDMARK RIGHT  eval RIGHT 11/08/22  10 cm proximal to olecranon process 31.4 34.4  Olecranon process 27.2 28  10  cm proximal to ulnar styloid process 25.7  25.5  Just proximal to ulnar styloid process 18.4 19  Across hand at thumb web space 20 20  At base of 2nd digit 6.4 6.5  (Blank rows = not tested)  LANDMARK LEFT  eval LEFT 10/25/22 Left  11/03/22 LEFT 11/08/22 LEFT  12/08/2022   10 cm proximal to olecranon process 33 33 31.9 33 34.3  Olecranon process 28.4 28.5 28.5 29 28.7  10 cm proximal to ulnar styloid process 26 26.5 26.4 26.6 26.5  Just proximal to ulnar styloid process 19.4 19 18.9 19.1 18.2  Across hand at thumb web space 21 20.5 19.7 20.7 19.5  2nd digit    6.7 6.5   TODAY'S TREATMENT:   12/15/2022  MLD: Short neck, L 5 diaphragmatic breaths, bil axillary nodes and establishment of interaxillary pathway, Lt inguinal nodes, Lt axillo-inguinal anastomosis avoiding radiation field tenderness, L UE working proximal to distal, then retracing all steps. Then to sidelying for more prolonged work on Retail buyer. supine dowel stretch in flexion and abduction In sidelying, small circles with hand pointed to ceiling,x 5  short range abduction abduction with straight arm, shoulder flexion and external rotation. X 10  Compression Bandaging: TG soft from hand to axilla,  mollelast to digits 1-5 with  , gray foam over dorsum of hand and wrist, artiflex from hand to axilla, 1 6 cm bandage at hand, 1 8 cm bandage from wrist to axilla with x at antecubital fossa and 1 12 cm bandage from wrist to axilla   12/06/2022 MLD: Short neck, L 5 diaphragmatic breaths, R axillary nodes and establishment of interaxillary pathway, Lt inguinal nodes, Lt axillo-inguinal anastomosis avoiding radiation field, L UE working proximal to distal, then retracing all steps.  PROM to L shoulder gentle in to flexion and abduction making sure  to avoid any pulling at fragile skin Compression Bandaging: TG soft from hand to axilla,  mollelast to digits 1-4 with  , artiflex from hand to axilla, 1 6 cm bandage at hand, 1 8 cm bandage from wrist to axilla in  herringbone fashion and 1 12 cm bandage from wrist to axilla   12/01/2022 MLD: Short neck, L 5 diaphragmatic breaths, R axillary nodes and establishment of interaxillary pathway, Lt inguinal nodes, Lt axillo-inguinal anastomosis avoiding radiation field, L UE working proximal to distal, then to R s/l to work on L lateral trunk moving fluid towards pathways then back to supine retracing all steps.  PROM to L shoulder gentle in to flexion and abduction making sure to avoid any pulling at fragile skin  11/29/2022 MLD: Short neck, L 5 diaphragmatic breaths, R axillary nodes and establishment of interaxillary pathway, Lt inguinal nodes, Lt axillo-inguinal anastomosis avoiding radiation field, L UE working proximal to distal, then to R s/l to work on L lateral trunk moving fluid towards pathways then back to supine retracing all steps.  PROM to L shoulder gentle in to flexion and abduction making sure to avoid any pulling at fragile skin Compression Bandaging: TG soft from hand to axilla,  mollelast to digits 1-5 with  , gray foam over dorsum of hand and wrist, artiflex from hand to axilla, 1 6 cm bandage at hand, 1 8 cm bandage from wrist to axilla with x at antecubital fossa and 1 12 cm bandage from wrist to axilla    11/24/2022 MLD: Short neck, Lt inguinal nodes, Lt axillo-inguinal anastomosis, avoiding radiation field, L UE working proximal to distal then retracing all steps. Did not work on lateral trunk due to compromised skin from radiation Compression Bandaging: TG soft from hand to axilla,  mollelast to digits 1-5 with  , gray foam over dorsum of hand and wrist, artiflex from hand to axilla, 1 6 cm bandage at hand, 1 8 cm bandage from wrist to axilla with x at antecubital fossa and 1 12 cm bandage from wrist to axilla                                                                                                                                  PATIENT EDUCATION:  Education details:supine dowel  flexion and abduction, Person educated: Patient Education method: Mining engineer given  Education comprehension: verbalized understanding  HOME EXERCISE PROGRAM: Obtain compression bra, continue to wear sleeve and glove  ASSESSMENT:  CLINICAL IMPRESSION: Focused on MLD across back and posterior shoulder and noticed palpable decrease with this. Waiting for garments.   OBJECTIVE IMPAIRMENTS: decreased knowledge of condition, decreased ROM, decreased strength, increased edema, increased fascial restrictions, impaired UE functional use, postural dysfunction, and pain.   ACTIVITY LIMITATIONS: carrying, lifting, and reach over head  PARTICIPATION LIMITATIONS: cleaning, laundry, community activity, occupation, and yard work  PERSONAL FACTORS: Time since onset of injury/illness/exacerbation are also affecting patient's functional  outcome.   REHAB POTENTIAL: Good  CLINICAL DECISION MAKING: Stable/uncomplicated  EVALUATION COMPLEXITY: Low  GOALS: Goals reviewed with patient? Yes   SHORT TERM GOALS = LONG TERM GOALS: Target date: 11/09/22  Pt will be independent in self MLD for long term management of lymphedema.  Baseline:  Goal status: IN PROGRESS   2.  Pt will obtain appropriate compression garments for long term management of lymphedema.  Baseline:  Goal status: IN PROGRESS  3.  Pt will demonstrate 160 degrees of L shoulder flexion to allow her to reach overhead. Baseline:  Goal status: IN PROGRESS 11/09/2022:  155  4. Pt will demonstrate 170 degrees of L shoulder abduction to allow her to reach out to the side.   Goal: IN PROGRESS 12/08/22 - 164  5. Pt will be independent in a home exercise program for continued stretching and strengthening.  Goal: IN PROGRESS   6. Pt will report she is able to raise her L arm to end range wihtout increased pain in her axilla to allow improved comfort.   GOAL: IN PROGRESS - pt still having pain   PLAN:  PT FREQUENCY:1-  2x/week  PT DURATION: 8 weeks   PLANNED INTERVENTIONS: Therapeutic exercises, Therapeutic activity, Patient/Family education, Self Care, Joint mobilization, Manual lymph drainage, Compression bandaging, scar mobilization, Taping, Vasopneumatic device, and Manual therapy  PLAN FOR NEXT SESSION:cont MLD to L UE and lateral trunk and, progress exercises to include stretching strengthening , make sure garments fit and pt is able to manage her self care at home prior to discharge   Stark Bray, PT 12/15/2022, 12:29 [facility] [address] [place], [place], [address] Phone: [phone]   Fax:  [phone]

## 2022-12-16 ENCOUNTER — Telehealth: Payer: Self-pay | Admitting: Hematology and Oncology

## 2022-12-16 ENCOUNTER — Other Ambulatory Visit: Payer: Self-pay | Admitting: *Deleted

## 2022-12-16 DIAGNOSIS — C50912 Malignant neoplasm of unspecified site of left female breast: Secondary | ICD-10-CM

## 2022-12-16 NOTE — Telephone Encounter (Signed)
Reached out to patient to schedule per 4/5 IB, patient aware of date and time of appointment.

## 2022-12-19 ENCOUNTER — Ambulatory Visit: Payer: No Typology Code available for payment source | Admitting: Physical Therapy

## 2022-12-19 DIAGNOSIS — M25612 Stiffness of left shoulder, not elsewhere classified: Secondary | ICD-10-CM

## 2022-12-19 DIAGNOSIS — M6281 Muscle weakness (generalized): Secondary | ICD-10-CM

## 2022-12-19 DIAGNOSIS — R293 Abnormal posture: Secondary | ICD-10-CM

## 2022-12-19 DIAGNOSIS — I972 Postmastectomy lymphedema syndrome: Secondary | ICD-10-CM

## 2022-12-19 DIAGNOSIS — M546 Pain in thoracic spine: Secondary | ICD-10-CM

## 2022-12-19 DIAGNOSIS — Z483 Aftercare following surgery for neoplasm: Secondary | ICD-10-CM

## 2022-12-19 NOTE — Therapy (Signed)
OUTPATIENT PHYSICAL THERAPY ONCOLOGY TREATMENT  Patient Name: [name] MRN: [id number] DOB:1976/04/09, 47 y.o., female Today's Date: 12/19/2022  END OF SESSION:  PT End of Session - 12/19/22 1201     Visit Number 20    Number of Visits 35    Date for PT Re-Evaluation 02/07/23    PT Start Time 1000    PT Stop Time 1045    PT Time Calculation (min) 45 min    Activity Tolerance Patient tolerated treatment well    Behavior During Therapy [facility] for tasks assessed/performed                      Past Medical History:  Diagnosis Date   Allergy    Asthma    Breast cancer in female    Left   Breast disorder    breast cancer May 2021   Chronic back pain    Headache    High triglycerides    History of breast cancer    History of COVID-19 04/20/2021   Hx of migraines    Morbid obesity    Personal history of chemotherapy    Personal history of radiation therapy    Past Surgical History:  Procedure Laterality Date   BREAST LUMPECTOMY     BREAST LUMPECTOMY WITH RADIOACTIVE SEED AND SENTINEL LYMPH NODE BIOPSY Left 07/15/2020   Procedure: LEFT BREAST LUMPECTOMY WITH RADIOACTIVE SEED AND LEFT AXILLARY SENTINEL LYMPH NODE BIOPSY, LEFT AXILLARY NODE RADIOACTIVE SEED GUIDED EXCISION, LEFT BREAST RADIOACTIVE SEED GUIDED EXCISION IM NODE;  Surgeon: Emelia Loron, MD;  Location: [facility];  Service: General;  Laterality: Left;  PEC BLOCK   CESAREAN SECTION WITH BILATERAL TUBAL LIGATION Bilateral 07/23/2015   Procedure: CESAREAN SECTION WITH BILATERAL TUBAL LIGATION;  Surgeon: Reva Bores, MD;  Location: [facility];  Service: Obstetrics;  Laterality: Bilateral;   IR IMAGING GUIDED PORT INSERTION  02/11/2022   MODIFIED MASTECTOMY Left 08/22/2022   Procedure: LEFT MODIFIED RADICAL MASTECTOMY, LEFT AXILLARY NODE DISSECTION, AND SKIN BIOPSY OF BACK;  Surgeon: Emelia Loron, MD;  Location: [facility];  Service: General;  Laterality: Left;   PORT-A-CATH REMOVAL N/A 04/30/2021    Procedure: REMOVAL PORT-A-CATH;  Surgeon: Emelia Loron, MD;  Location: [facility];  Service: General;  Laterality: N/A;   PORTACATH PLACEMENT Right 02/11/2020   Procedure: INSERTION PORT-A-CATH WITH ULTRASOUND GUIDANCE;  Surgeon: Emelia Loron, MD;  Location: [facility];  Service: General;  Laterality: Right;   TUBAL LIGATION     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 04/01/2022   Encounter for dental examination 03/11/2022   Acute apical periodontitis 03/11/2022   Phobia of dental procedure 03/11/2022   Defective dental restoration 03/11/2022   Accretions on teeth 03/11/2022   Chronic periodontitis 03/11/2022   Teeth missing 03/11/2022   Caries 03/11/2022   Generalized gingival recession 03/11/2022   Pain, dental 03/08/2022   Cancer-related pain 03/08/2022   Port-A-Cath in place 02/15/2022   Oral allergy syndrome, subsequent encounter 02/03/2022   Adverse effect of other drugs, medicaments and biological substances, subsequent encounter 02/02/2022   Other adverse food reactions, not elsewhere classified, subsequent encounter 02/02/2022   Chest tightness 02/02/2022   Other allergic rhinitis 02/02/2022   Allergic conjunctivitis  of both eyes 02/02/2022   Language barrier 12/11/2020   Morbid obesity    History of breast cancer    Genetic testing 02/13/2020   Malignant neoplasm of upper-outer quadrant of left breast in female, estrogen receptor positive 01/27/2020   Impaired ability to use community resources due to language barrier 07/13/2013    PCP: Angus Seller, NP  REFERRING PROVIDER: Rachel Moulds, MD  REFERRING DIAG: Left UE lymphedema  THERAPY DIAG:  Postmastectomy lymphedema  Stiffness of  left shoulder, not elsewhere classified  Aftercare following surgery for neoplasm  Abnormal posture  Muscle weakness (generalized)  Pain in thoracic spine  ONSET DATE: 08/01/22  Rationale for Evaluation and Treatment: Rehabilitation  SUBJECTIVE:                                                                                                                                                                                           SUBJECTIVE STATEMENT:  Pt says that she is bandaging her arm at home.  She wants to focus on MLD today since she cannot do that at home   PERTINENT HISTORY:  Patient was diagnosed on 01/23/2020 with left grade III invasive ductal carcinoma breast cancer. It is ER positive, PR negative, and HER2 negative , but functionally Triple Negative,with a Ki67 of 40%. Patient with interpreter present. She underwent neoadjuvant chemotherapy from 02/12/2020 - 05/28/2020. 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 She underwent a left lumpectomy and sentinel node biopsy ( 4 negative nodes) on 07/15/2020. She underwent radiation and anti-estrogen therapy.  She had interval development of diffuse skin thickening in 2023. She had a punch biopsy of her skin at that time and this was consistent with inflammatory breast cancer. She has been treated with chemo since then. She had a left modified radical mastectomy on 08/22/2022 with complete therapeutic response and 0/1 LN. She noted some swelling in her left arm and on 08/25/2022 she had an [place] that was negative for DVT. Her SOZO screen on 09/19/2021 was 6.9 points above baseline putting her in the yellow zone and she was given a compression sleeve that was not containing  her so a flat knit sleeve and glove and nighttime garment  have been ordered   PAIN:  Are you having pain? Not in arm, just her toes   PRECAUTIONS: Other: lymphedema [name]  WEIGHT BEARING RESTRICTIONS: No  FALLS:  Has patient fallen in last 6 months?  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 Fullness in left lateral chest and back as well as left arm and hand especially on thumb side   OBSERVATIONS / OTHER ASSESSMENTS: scar well healed, increased edema at lateral trunk and throughout [name] reddened area in radiation field with skin appearing fragile and irritated but no open areas   POSTURE: forward head, rounded shoulders  UPPER EXTREMITY AROM/PROM:  A/PROM RIGHT   eval   Shoulder extension 68  Shoulder flexion 160  Shoulder abduction 175  Shoulder internal rotation 56  Shoulder external rotation 88    (Blank rows = not tested)  A/PROM LEFT   eval LEFT 11/10/22 LEFT 12/06/22 LEFT 12/08/2022 LEFT 12/19/2022  Shoulder extension 52      Shoulder flexion 146 150 156 155 165  Shoulder abduction 148 160 164 170 170  Shoulder internal rotation 67      Shoulder external rotation 86        (Blank rows = not tested)   LYMPHEDEMA ASSESSMENTS:  SURGERY TYPE/DATE: Left Lumpectomy with SLNB11/11/2019, Left modified mastectomy with left axillary node dissection NUMBER OF LYMPH NODES REMOVED: 0/4 with lumpectomy, 0/1 during Mastectomy CHEMOTHERAPY: yes RADIATION:Yes HORMONE TREATMENT: may require INFECTIONS: none  LYMPHEDEMA ASSESSMENTS:  LANDMARK RIGHT  eval RIGHT 11/08/22  10 cm proximal to olecranon  process 31.4 34.4  Olecranon process 27.2 28  10  cm proximal to ulnar styloid process 25.7 25.5  Just proximal to ulnar styloid process 18.4 19  Across hand at thumb web space 20 20  At base of 2nd digit 6.4 6.5  (Blank rows = not tested)  LANDMARK LEFT  eval LEFT 10/25/22 Left  11/03/22 LEFT 11/08/22 LEFT  12/08/2022   10 cm proximal to olecranon process 33 33 31.9 33 34.3  Olecranon process 28.4 28.5 28.5 29 28.7  10 cm proximal to ulnar styloid process 26 26.5 26.4 26.6 26.5  Just proximal to ulnar styloid process 19.4 19 18.9 19.1 18.2  Across hand at thumb web space 21 20.5 19.7 20.7 19.5  2nd digit    6.7 6.5   TODAY'S TREATMENT:   12/19/2022  MLD: Short neck, L 5 diaphragmatic breaths, bil axillary nodes and establishment of interaxillary pathway, Lt inguinal nodes, Lt axillo-inguinal anastomosis L UE working proximal to distal especially at thumb side of hand with arm elevated. then retracing all steps. Then to sidelying for more prolonged work on posterior interaxillary anastamosis with gentle pressure from one hand on lateral chest and axilla. In sidelying, small circles with hand pointed to ceiling, shoulder flexion and external rotation. X 10  Talked with pt about Flexitouch but she says she has not heard from them yet, but may have not picked up their call.  Gave pt [employer] card and asked to call them to check on it herself ( with assist from English speaking family) Reinforced scar massage and skin stretch at anterior chest since she is healed from radiation   12/15/2022  MLD: Short neck, L 5 diaphragmatic breaths, bil axillary nodes and establishment of interaxillary pathway, Lt inguinal nodes, Lt  axillo-inguinal anastomosis avoiding radiation field tenderness, L UE working proximal to distal, then retracing all steps. Then to sidelying for more prolonged work on Merchandiser, retail. supine dowel stretch in flexion and abduction In sidelying, small circles with hand  pointed to ceiling,x 5  short range abduction abduction with straight arm, shoulder flexion and external rotation. X 10  Compression Bandaging: TG soft from hand to axilla,  mollelast to digits 1-5 with  , gray foam over dorsum of hand and wrist, artiflex from hand to axilla, 1 6 cm bandage at hand, 1 8 cm bandage from wrist to axilla with x at antecubital fossa and 1 12 cm bandage from wrist to axilla   12/06/2022 MLD: Short neck, L 5 diaphragmatic breaths, R axillary nodes and establishment of interaxillary pathway, Lt inguinal nodes, Lt axillo-inguinal anastomosis avoiding radiation field, L UE working proximal to distal, then retracing all steps.  PROM to L shoulder gentle in to flexion and abduction making sure to avoid any pulling at fragile skin Compression Bandaging: TG soft from hand to axilla,  mollelast to digits 1-4 with  , artiflex from hand to axilla, 1 6 cm bandage at hand, 1 8 cm bandage from wrist to axilla in herringbone fashion and 1 12 cm bandage from wrist to axilla   12/01/2022 MLD: Short neck, L 5 diaphragmatic breaths, R axillary nodes and establishment of interaxillary pathway, Lt inguinal nodes, Lt axillo-inguinal anastomosis avoiding radiation field, L UE working proximal to distal, then to R s/l to work on L lateral trunk moving fluid towards pathways then back to supine retracing all steps.  PROM to L shoulder gentle in to flexion and abduction making sure to avoid any pulling at fragile skin  11/29/2022 MLD: Short neck, L 5 diaphragmatic breaths, R axillary nodes and establishment of interaxillary pathway, Lt inguinal nodes, Lt axillo-inguinal anastomosis avoiding radiation field, L UE working proximal to distal, then to R s/l to work on L lateral trunk moving fluid towards pathways then back to supine retracing all steps.  PROM to L shoulder gentle in to flexion and abduction making sure to avoid any pulling at fragile skin Compression Bandaging: TG soft from hand to  axilla,  mollelast to digits 1-5 with  , gray foam over dorsum of hand and wrist, artiflex from hand to axilla, 1 6 cm bandage at hand, 1 8 cm bandage from wrist to axilla with x at antecubital fossa and 1 12 cm bandage from wrist to axilla    11/24/2022 MLD: Short neck, Lt inguinal nodes, Lt axillo-inguinal anastomosis, avoiding radiation field, L UE working proximal to distal then retracing all steps. Did not work on lateral trunk due to compromised skin from radiation Compression Bandaging: TG soft from hand to axilla,  mollelast to digits 1-5 with  , gray foam over dorsum of hand and wrist, artiflex from hand to axilla, 1 6 cm bandage at hand, 1 8 cm bandage from wrist to axilla with x at antecubital fossa and 1 12 cm bandage from wrist to axilla  PATIENT EDUCATION:  Education details:supine dowel flexion and abduction, Person educated: Patient Education method: Actor given  Education comprehension: verbalized understanding  HOME EXERCISE PROGRAM: Obtain compression bra, continue to wear sleeve and glove  ASSESSMENT:  CLINICAL IMPRESSION: Focused on MLD across back and posterior shoulder and noticed palpable decrease with this. Waiting for garments. Pt continues to have congestion at lateral chest and axillaa that is improved with MLD along with lymphedema in her arm. so she would really benefit from Flexitouch.  OBJECTIVE IMPAIRMENTS: decreased knowledge of condition, decreased ROM, decreased strength, increased edema, increased fascial restrictions, impaired UE functional use, postural dysfunction, and pain.   ACTIVITY LIMITATIONS: carrying, lifting, and reach over head  PARTICIPATION LIMITATIONS: cleaning, laundry, community activity, occupation, and yard work  PERSONAL FACTORS: Time since onset of injury/illness/exacerbation are also affecting  patient's functional outcome.   REHAB POTENTIAL: Good  CLINICAL DECISION MAKING: Stable/uncomplicated  EVALUATION COMPLEXITY: Low  GOALS: Goals reviewed with patient? Yes   SHORT TERM GOALS = LONG TERM GOALS: Target date: 11/09/22  Pt will be independent in self MLD for long term management of lymphedema.  Baseline:  Goal status: IN PROGRESS   2.  Pt will obtain appropriate compression garments for long term management of lymphedema.  Baseline:  Goal status: IN PROGRESS  3.  Pt will demonstrate 160 degrees of L shoulder flexion to allow her to reach overhead. Baseline:  Goal status: IN PROGRESS 11/09/2022:  155  4. Pt will demonstrate 170 degrees of L shoulder abduction to allow her to reach out to the side.   Goal: IN PROGRESS 12/08/22 - 164  5. Pt will be independent in a home exercise program for continued stretching and strengthening.  Goal: IN PROGRESS   6. Pt will report she is able to raise her L arm to end range wihtout increased pain in her axilla to allow improved comfort.   GOAL: IN PROGRESS - pt still having pain   PLAN:  PT FREQUENCY:1- 2x/week  PT DURATION: 8 weeks   PLANNED INTERVENTIONS: Therapeutic exercises, Therapeutic activity, Patient/Family education, Self Care, Joint mobilization, Manual lymph drainage, Compression bandaging, scar mobilization, Taping, Vasopneumatic device, and Manual therapy  PLAN FOR NEXT SESSION:has pt heard from Felxitouch? cont MLD to L UE and lateral trunk and, progress exercises to include stretching strengthening , make sure garments fit and pt is able to manage her self care at home prior to discharge  Rosey Bath K. Manson Passey PT  Donnetta Hail, PT 12/19/2022, 12:02 [facility] [address] [place], [place], [address] Phone: [phone]   Fax:  [phone]

## 2022-12-20 ENCOUNTER — Inpatient Hospital Stay: Payer: No Typology Code available for payment source | Attending: Hematology and Oncology | Admitting: Nutrition

## 2022-12-20 ENCOUNTER — Other Ambulatory Visit (HOSPITAL_COMMUNITY): Payer: Self-pay

## 2022-12-20 ENCOUNTER — Other Ambulatory Visit: Payer: Self-pay

## 2022-12-20 DIAGNOSIS — G893 Neoplasm related pain (acute) (chronic): Secondary | ICD-10-CM | POA: Insufficient documentation

## 2022-12-20 DIAGNOSIS — Z9221 Personal history of antineoplastic chemotherapy: Secondary | ICD-10-CM | POA: Insufficient documentation

## 2022-12-20 DIAGNOSIS — R519 Headache, unspecified: Secondary | ICD-10-CM | POA: Insufficient documentation

## 2022-12-20 DIAGNOSIS — Z9012 Acquired absence of left breast and nipple: Secondary | ICD-10-CM | POA: Insufficient documentation

## 2022-12-20 DIAGNOSIS — C50412 Malignant neoplasm of upper-outer quadrant of left female breast: Secondary | ICD-10-CM | POA: Insufficient documentation

## 2022-12-20 DIAGNOSIS — K219 Gastro-esophageal reflux disease without esophagitis: Secondary | ICD-10-CM | POA: Insufficient documentation

## 2022-12-20 DIAGNOSIS — Z170 Estrogen receptor positive status [ER+]: Secondary | ICD-10-CM | POA: Insufficient documentation

## 2022-12-20 DIAGNOSIS — Z923 Personal history of irradiation: Secondary | ICD-10-CM | POA: Insufficient documentation

## 2022-12-20 DIAGNOSIS — K029 Dental caries, unspecified: Secondary | ICD-10-CM | POA: Insufficient documentation

## 2022-12-20 DIAGNOSIS — Z8616 Personal history of COVID-19: Secondary | ICD-10-CM | POA: Insufficient documentation

## 2022-12-20 DIAGNOSIS — R109 Unspecified abdominal pain: Secondary | ICD-10-CM | POA: Insufficient documentation

## 2022-12-20 NOTE — Progress Notes (Signed)
Patient is seen with Spanish interpreter [name].  Patient is a 47 year old female diagnosed with recurrent breast cancer receiving adjuvant Xeloda and followed by Dr. Al Pimple.  Past medical history includes triple negative breast cancer diagnosed in 2021 status post chemotherapy, radiation and surgery, Vitamin D deficiency, hypertriglyceridemia and COVID-19.  Medications include Zofran and Compazine.  Labs include glucose 100.  Height: 61 inches. Weight: 192 pounds. Usual body weight: 185-190 pounds. BMI: 36.3.  ECOG: 1  Patient requested nutrition visit because she had many questions about what foods she should be eating.  She denies nutrition impact symptoms.  Also denies weight changes.  Nutrition diagnosis: Food and nutrition related knowledge deficit related to breast cancer and associated treatments as evidenced by no prior need for nutrition related information.  Intervention: Educated patient on increasing whole grains and fruits and vegetables.  Encouraged lean meats.  Clarified recommendations to limit red meat to 18 ounces weekly.  Reviewed sugar and cancer.  Encouraged physical activity as tolerated.  Provided nutrition facts sheets on nutrition for breast cancer patients.  Questions were answered.  Monitoring, evaluation, goals: Patient will tolerate healthy plant-based diet to minimize weight gain and improve quality of life.  No follow-up scheduled at this time.  **Disclaimer: This note was dictated with voice recognition software. Similar sounding words can inadvertently be transcribed and this note may contain transcription errors which may not have been corrected upon publication of note.**

## 2022-12-21 ENCOUNTER — Ambulatory Visit: Payer: No Typology Code available for payment source | Admitting: Physical Therapy

## 2022-12-21 DIAGNOSIS — M6281 Muscle weakness (generalized): Secondary | ICD-10-CM

## 2022-12-21 DIAGNOSIS — Z483 Aftercare following surgery for neoplasm: Secondary | ICD-10-CM

## 2022-12-21 DIAGNOSIS — Z170 Estrogen receptor positive status [ER+]: Secondary | ICD-10-CM

## 2022-12-21 DIAGNOSIS — R293 Abnormal posture: Secondary | ICD-10-CM

## 2022-12-21 DIAGNOSIS — I972 Postmastectomy lymphedema syndrome: Secondary | ICD-10-CM

## 2022-12-21 DIAGNOSIS — M25612 Stiffness of left shoulder, not elsewhere classified: Secondary | ICD-10-CM

## 2022-12-21 DIAGNOSIS — M546 Pain in thoracic spine: Secondary | ICD-10-CM

## 2022-12-21 NOTE — Therapy (Signed)
OUTPATIENT PHYSICAL THERAPY ONCOLOGY TREATMENT  Patient Name: [name] MRN: [id number] DOB:1976-02-19, 47 y.o., female Today's Date: 12/21/2022  END OF SESSION:  PT End of Session - 12/21/22 1200     Visit Number 21    Number of Visits 35    Date for PT Re-Evaluation 02/07/23    PT Start Time 1100    PT Stop Time 1158    PT Time Calculation (min) 58 min    Activity Tolerance Patient tolerated treatment well    Behavior During Therapy [facility] for tasks assessed/performed                      Past Medical History:  Diagnosis Date   Allergy    Asthma    Breast cancer in female    Left   Breast disorder    breast cancer May 2021   Chronic back pain    Headache    High triglycerides    History of breast cancer    History of COVID-19 04/20/2021   Hx of migraines    Morbid obesity    Personal history of chemotherapy    Personal history of radiation therapy    Past Surgical History:  Procedure Laterality Date   BREAST LUMPECTOMY     BREAST LUMPECTOMY WITH RADIOACTIVE SEED AND SENTINEL LYMPH NODE BIOPSY Left 07/15/2020   Procedure: LEFT BREAST LUMPECTOMY WITH RADIOACTIVE SEED AND LEFT AXILLARY SENTINEL LYMPH NODE BIOPSY, LEFT AXILLARY NODE RADIOACTIVE SEED GUIDED EXCISION, LEFT BREAST RADIOACTIVE SEED GUIDED EXCISION IM NODE;  Surgeon: Emelia Loron, MD;  Location: [facility];  Service: General;  Laterality: Left;  PEC BLOCK   CESAREAN SECTION WITH BILATERAL TUBAL LIGATION Bilateral 07/23/2015   Procedure: CESAREAN SECTION WITH BILATERAL TUBAL LIGATION;  Surgeon: Reva Bores, MD;  Location: [facility];  Service: Obstetrics;  Laterality: Bilateral;   IR IMAGING GUIDED PORT INSERTION  02/11/2022   MODIFIED MASTECTOMY Left 08/22/2022   Procedure: LEFT MODIFIED RADICAL MASTECTOMY, LEFT AXILLARY NODE DISSECTION, AND SKIN BIOPSY OF BACK;  Surgeon: Emelia Loron, MD;  Location: [facility];  Service: General;  Laterality: Left;   PORT-A-CATH REMOVAL N/A  04/30/2021   Procedure: REMOVAL PORT-A-CATH;  Surgeon: Emelia Loron, MD;  Location: [facility];  Service: General;  Laterality: N/A;   PORTACATH PLACEMENT Right 02/11/2020   Procedure: INSERTION PORT-A-CATH WITH ULTRASOUND GUIDANCE;  Surgeon: Emelia Loron, MD;  Location: [facility];  Service: General;  Laterality: Right;   TUBAL LIGATION     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 04/01/2022   Encounter for dental examination 03/11/2022   Acute apical periodontitis 03/11/2022   Phobia of dental procedure 03/11/2022   Defective dental restoration 03/11/2022   Accretions on teeth 03/11/2022   Chronic periodontitis 03/11/2022   Teeth missing 03/11/2022   Caries 03/11/2022   Generalized gingival recession 03/11/2022   Pain, dental 03/08/2022   Cancer-related pain 03/08/2022   Port-A-Cath in place 02/15/2022   Oral allergy syndrome, subsequent encounter 02/03/2022   Adverse effect of other drugs, medicaments and biological substances, subsequent encounter 02/02/2022   Other adverse food reactions, not elsewhere classified, subsequent encounter 02/02/2022   Chest tightness 02/02/2022   Other allergic rhinitis 02/02/2022  Allergic conjunctivitis of both eyes 02/02/2022   Language barrier 12/11/2020   Morbid obesity    History of breast cancer    Genetic testing 02/13/2020   Malignant neoplasm of upper-outer quadrant of left breast in female, estrogen receptor positive 01/27/2020   Impaired ability to use community resources due to language barrier 07/13/2013    PCP: Angus Selleronya Nichols, NP  REFERRING PROVIDER: Rachel MouldsPraveena Iruku, MD  REFERRING DIAG: Left UE lymphedema  THERAPY DIAG:  Postmastectomy  lymphedema  Stiffness of left shoulder, not elsewhere classified  Aftercare following surgery for neoplasm  Abnormal posture  Muscle weakness (generalized)  Pain in thoracic spine  Malignant neoplasm of upper-outer quadrant of left breast in female, estrogen receptor positive  ONSET DATE: 08/01/22  Rationale for Evaluation and Treatment: Rehabilitation  SUBJECTIVE:                                                                                                                                                                                           SUBJECTIVE STATEMENT:  Pt is hoping that she can decrease her visits soon. Pt says that she is bandaging her arm at home.  She wants to focus on MLD today since she cannot do that at home   PERTINENT HISTORY:  Patient was diagnosed on 01/23/2020 with left grade III invasive ductal carcinoma breast cancer. It is ER positive, PR negative, and HER2 negative , but functionally Triple Negative,with a Ki67 of 40%. Patient with interpreter present. She underwent neoadjuvant chemotherapy from 02/12/2020 - 05/28/2020. 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 She underwent a left lumpectomy and sentinel node biopsy ( 4 negative nodes) on 07/15/2020. She underwent radiation and anti-estrogen therapy.  She had interval development of diffuse skin thickening in 2023. She had a punch biopsy of her skin at that time and this was consistent with inflammatory breast cancer. She has been treated with chemo since then. She had a left modified radical mastectomy on 08/22/2022 with complete therapeutic response and 0/1 LN. She noted some swelling in her left arm and on 08/25/2022 she had an US that was negative for DVT. Her SOZO screen on 09/19/2021 was 6.9 points above baseline putting her in the yellow zone and she was given a compression sleeve that was not containing  her so a flat knit sleeve and glove and nighttime garment  have been ordered   PAIN:   Are you having pain? Not in arm, just her toes   PRECAUTIONS: Other: lymphedema [name]  WEIGHT BEARING RESTRICTIONS: No  FALLS:  Has patient fallen in last 6 months? 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 Fullness in left lateral chest and back as well as left arm and hand especially on thumb side   OBSERVATIONS / OTHER ASSESSMENTS: scar well healed, increased edema at lateral trunk and throughout [name] reddened area in radiation field with skin appearing fragile and irritated but no open areas   POSTURE: forward head, rounded shoulders  UPPER EXTREMITY AROM/PROM:  A/PROM RIGHT   eval   Shoulder extension 68  Shoulder flexion 160  Shoulder abduction 175  Shoulder internal rotation 56  Shoulder external rotation 88    (Blank rows = not tested)  A/PROM LEFT   eval LEFT 11/10/22 LEFT 12/06/22 LEFT 12/08/2022 LEFT 12/19/2022  Shoulder extension 52      Shoulder flexion 146 150 156 155 165  Shoulder abduction 148 160 164 170 170  Shoulder internal rotation 67      Shoulder external rotation 86        (Blank rows = not tested)   LYMPHEDEMA ASSESSMENTS:  SURGERY TYPE/DATE: Left Lumpectomy with SLNB11/11/2019, Left modified mastectomy with left axillary node dissection NUMBER OF LYMPH NODES REMOVED: 0/4 with lumpectomy, 0/1 during  Mastectomy CHEMOTHERAPY: yes RADIATION:Yes HORMONE TREATMENT: may require INFECTIONS: none  LYMPHEDEMA ASSESSMENTS:  LANDMARK RIGHT  eval RIGHT 11/08/22  10 cm proximal to olecranon process 31.4 34.4  Olecranon process 27.2 28  10  cm proximal to ulnar styloid process 25.7 25.5  Just proximal to ulnar styloid process 18.4 19  Across hand at thumb web space 20 20  At base of 2nd digit 6.4 6.5  (Blank rows = not tested)  LANDMARK LEFT  eval LEFT 10/25/22 Left  11/03/22 LEFT 11/08/22 LEFT  12/08/2022   10 cm proximal to olecranon process 33 33 31.9 33 34.3  Olecranon process 28.4 28.5 28.5 29 28.7  10 cm proximal to ulnar styloid process 26 26.5 26.4 26.6 26.5  Just proximal to ulnar styloid process 19.4 19 18.9 19.1 18.2  Across hand at thumb web space 21 20.5 19.7 20.7 19.5  2nd digit    6.7 6.5   TODAY'S TREATMENT:   12/21/2022: Rep from [employer] in clinic today. Arrangements were made for rep to come to our clinic at 11:00 on 01/03/2023 to do the demonstration for the Flex[employer]ouch and fill out the needed paperwork to see if pt qualifies for a donated garment. Also checked on the sleeve as the [employer] portal says that they are waiting for payment and pt given information to contact them about it.  Pt askes for MLD only as she cannot do that at home so it was performed as on 12/19/2022 with extra time and stretch to anterior chest and MLD at fullness at anterior and posterior axilla.    12/19/2022  MLD: Short neck, L 5 diaphragmatic breaths, bil axillary nodes and establishment of interaxillary pathway, Lt inguinal nodes, Lt axillo-inguinal anastomosis L UE working proximal to distal especially at  thumb side of hand with arm elevated. then retracing all steps. Then to sidelying for more prolonged work on posterior interaxillary anastamosis with gentle pressure from one hand on lateral chest and axilla. In sidelying, small circles with hand pointed to ceiling, shoulder flexion and external  rotation. X 10  Talked with pt about [employer] but she says she has not heard from them yet, but may have not picked up their call.  Gave pt [employer] card and asked to call them to check on it herself ( with assist from English speaking family) Reinforced scar massage and skin stretch at anterior chest since she is healed from radiation   12/15/2022  MLD: Short neck, L 5 diaphragmatic breaths, bil axillary nodes and establishment of interaxillary pathway, Lt inguinal nodes, Lt axillo-inguinal anastomosis avoiding radiation field tenderness, L UE working proximal to distal, then retracing all steps. Then to sidelying for more prolonged work on Merchandiser, retail. supine dowel stretch in flexion and abduction In sidelying, small circles with hand pointed to ceiling,x 5  short range abduction abduction with straight arm, shoulder flexion and external rotation. X 10  Compression Bandaging: TG soft from hand to axilla,  mollelast to digits 1-5 with  , gray foam over dorsum of hand and wrist, artiflex from hand to axilla, 1 6 cm bandage at hand, 1 8 cm bandage from wrist to axilla with x at antecubital fossa and 1 12 cm bandage from wrist to axilla   12/06/2022 MLD: Short neck, L 5 diaphragmatic breaths, R axillary nodes and establishment of interaxillary pathway, Lt inguinal nodes, Lt axillo-inguinal anastomosis avoiding radiation field, L UE working proximal to distal, then retracing all steps.  PROM to L shoulder gentle in to flexion and abduction making sure to avoid any pulling at fragile skin Compression Bandaging: TG soft from hand to axilla,  mollelast to digits 1-4 with  , artiflex from hand to axilla, 1 6 cm bandage at hand, 1 8 cm bandage from wrist to axilla in herringbone fashion and 1 12 cm bandage from wrist to axilla   12/01/2022 MLD: Short neck, L 5 diaphragmatic breaths, R axillary nodes and establishment of interaxillary pathway, Lt inguinal nodes, Lt axillo-inguinal  anastomosis avoiding radiation field, L UE working proximal to distal, then to R s/l to work on L lateral trunk moving fluid towards pathways then back to supine retracing all steps.  PROM to L shoulder gentle in to flexion and abduction making sure to avoid any pulling at fragile skin  11/29/2022 MLD: Short neck, L 5 diaphragmatic breaths, R axillary nodes and establishment of interaxillary pathway, Lt inguinal nodes, Lt axillo-inguinal anastomosis avoiding radiation field, L UE working proximal to distal, then to R s/l to work on L lateral trunk moving fluid towards pathways then back to supine retracing all steps.  PROM to L shoulder gentle in to flexion and abduction making sure to avoid any pulling at fragile skin Compression Bandaging: TG soft from hand to axilla,  mollelast to digits 1-5 with  , gray foam over dorsum of hand and wrist, artiflex from hand to axilla, 1 6 cm bandage at hand, 1 8 cm bandage from wrist to axilla with x at antecubital fossa and 1 12 cm bandage from wrist to axilla    11/24/2022 MLD: Short neck, Lt inguinal nodes, Lt axillo-inguinal anastomosis, avoiding radiation field, L UE working proximal to distal then retracing all steps. Did not work on lateral trunk due to compromised skin from radiation Compression Bandaging:  TG soft from hand to axilla,  mollelast to digits 1-5 with  , gray foam over dorsum of hand and wrist, artiflex from hand to axilla, 1 6 cm bandage at hand, 1 8 cm bandage from wrist to axilla with x at antecubital fossa and 1 12 cm bandage from wrist to axilla                                                                                                                                  PATIENT EDUCATION:  Education details:supine dowel flexion and abduction, Person educated: Patient Education method: Actor given  Education comprehension: verbalized understanding  HOME EXERCISE PROGRAM: Obtain compression bra, continue to wear sleeve  and glove  ASSESSMENT:  CLINICAL IMPRESSION: Focused on MLD across back and posterior shoulder and noticed palpable decrease with this. Waiting for garments. Pt continues to have congestion at lateral chest and axillaa that is improved with MLD along with lymphedema in her arm. so she would really benefit from [employer].  OBJECTIVE IMPAIRMENTS: decreased knowledge of condition, decreased ROM, decreased strength, increased edema, increased fascial restrictions, impaired UE functional use, postural dysfunction, and pain.   ACTIVITY LIMITATIONS: carrying, lifting, and reach over head  PARTICIPATION LIMITATIONS: cleaning, laundry, community activity, occupation, and yard work  PERSONAL FACTORS: Time since onset of injury/illness/exacerbation are also affecting patient's functional outcome.   REHAB POTENTIAL: Good  CLINICAL DECISION MAKING: Stable/uncomplicated  EVALUATION COMPLEXITY: Low  GOALS: Goals reviewed with patient? Yes   SHORT TERM GOALS = LONG TERM GOALS: Target date: 11/09/22  Pt will be independent in self MLD for long term management of lymphedema.  Baseline:  Goal status: IN PROGRESS   2.  Pt will obtain appropriate compression garments for long term management of lymphedema.  Baseline:  Goal status: IN PROGRESS  3.  Pt will demonstrate 160 degrees of L shoulder flexion to allow her to reach overhead. Baseline:  Goal status: IN PROGRESS 11/09/2022:  155  4. Pt will demonstrate 170 degrees of L shoulder abduction to allow her to reach out to the side.   Goal: IN PROGRESS 12/08/22 - 164  5. Pt will be independent in a home exercise program for continued stretching and strengthening.  Goal: IN PROGRESS   6. Pt will report she is able to raise her L arm to end range wihtout increased pain in her axilla to allow improved comfort.   GOAL: IN PROGRESS - pt still having pain   PLAN:  PT FREQUENCY:1- 2x/week  PT DURATION: 8 weeks   PLANNED INTERVENTIONS:  Therapeutic exercises, Therapeutic activity, Patient/Family education, Self Care, Joint mobilization, Manual lymph drainage, Compression bandaging, scar mobilization, Taping, Vasopneumatic device, and Manual therapy  PLAN FOR NEXT SESSION cont MLD to L UE and lateral trunk and, progress exercises to include stretching strengthening , make sure garments fit and pt is able to manage her self care at home prior to discharge   Treatment  on 01/03/2023 for [employer] demonstration and paperwork completion needed for donated garment.    Bayard Hugger. Manson Passey PT  Donnetta Hail, PT 12/21/2022, 12:01 [facility] [address] [place], [place], [address] Phone: [phone]   Fax:  [phone]

## 2022-12-22 ENCOUNTER — Other Ambulatory Visit (HOSPITAL_COMMUNITY): Payer: Self-pay

## 2022-12-22 ENCOUNTER — Encounter: Payer: Self-pay | Admitting: Hematology and Oncology

## 2022-12-23 ENCOUNTER — Encounter: Payer: Self-pay | Admitting: Hematology and Oncology

## 2022-12-23 ENCOUNTER — Other Ambulatory Visit (HOSPITAL_COMMUNITY): Payer: Self-pay

## 2022-12-23 ENCOUNTER — Inpatient Hospital Stay: Payer: No Typology Code available for payment source

## 2022-12-23 ENCOUNTER — Other Ambulatory Visit: Payer: Self-pay

## 2022-12-23 ENCOUNTER — Other Ambulatory Visit: Payer: Self-pay | Admitting: *Deleted

## 2022-12-23 ENCOUNTER — Inpatient Hospital Stay (HOSPITAL_BASED_OUTPATIENT_CLINIC_OR_DEPARTMENT_OTHER): Payer: No Typology Code available for payment source | Admitting: Hematology and Oncology

## 2022-12-23 VITALS — BP 118/84 | HR 74 | Temp 97.9°F | Resp 16 | Ht 61.0 in | Wt 192.7 lb

## 2022-12-23 DIAGNOSIS — L271 Localized skin eruption due to drugs and medicaments taken internally: Secondary | ICD-10-CM

## 2022-12-23 DIAGNOSIS — K1231 Oral mucositis (ulcerative) due to antineoplastic therapy: Secondary | ICD-10-CM

## 2022-12-23 DIAGNOSIS — Z170 Estrogen receptor positive status [ER+]: Secondary | ICD-10-CM

## 2022-12-23 DIAGNOSIS — C50412 Malignant neoplasm of upper-outer quadrant of left female breast: Secondary | ICD-10-CM

## 2022-12-23 DIAGNOSIS — K219 Gastro-esophageal reflux disease without esophagitis: Secondary | ICD-10-CM

## 2022-12-23 LAB — CBC WITH DIFFERENTIAL/PLATELET
Abs Immature Granulocytes: 0.01 10*3/uL (ref 0.00–0.07)
Basophils Absolute: 0 10*3/uL (ref 0.0–0.1)
Basophils Relative: 0 %
Eosinophils Absolute: 0.1 10*3/uL (ref 0.0–0.5)
Eosinophils Relative: 2 %
HCT: 37.9 % (ref 36.0–46.0)
Hemoglobin: 12.8 g/dL (ref 12.0–15.0)
Immature Granulocytes: 0 %
Lymphocytes Relative: 28 %
Lymphs Abs: 0.9 10*3/uL (ref 0.7–4.0)
MCH: 27.4 pg (ref 26.0–34.0)
MCHC: 33.8 g/dL (ref 30.0–36.0)
MCV: 81.2 fL (ref 80.0–100.0)
Monocytes Absolute: 0.2 10*3/uL (ref 0.1–1.0)
Monocytes Relative: 5 %
Neutro Abs: 1.9 10*3/uL (ref 1.7–7.7)
Neutrophils Relative %: 65 %
Platelets: 212 10*3/uL (ref 150–400)
RBC: 4.67 MIL/uL (ref 3.87–5.11)
RDW: 13.5 % (ref 11.5–15.5)
WBC: 3.1 10*3/uL — ABNORMAL LOW (ref 4.0–10.5)
nRBC: 0 % (ref 0.0–0.2)

## 2022-12-23 LAB — CMP (CANCER CENTER ONLY)
ALT: 13 U/L (ref 0–44)
AST: 18 U/L (ref 15–41)
Albumin: 4.3 g/dL (ref 3.5–5.0)
Alkaline Phosphatase: 89 U/L (ref 38–126)
Anion gap: 5 (ref 5–15)
BUN: 11 mg/dL (ref 6–20)
CO2: 30 mmol/L (ref 22–32)
Calcium: 9.8 mg/dL (ref 8.9–10.3)
Chloride: 104 mmol/L (ref 98–111)
Creatinine: 0.78 mg/dL (ref 0.44–1.00)
GFR, Estimated: >60 mL/min (ref >60)
Glucose, Bld: 102 mg/dL — ABNORMAL HIGH (ref 70–99)
Potassium: 3.8 mmol/L (ref 3.5–5.1)
Sodium: 139 mmol/L (ref 135–145)
Total Bilirubin: 0.5 mg/dL (ref 0.3–1.2)
Total Protein: 7.3 g/dL (ref 6.5–8.1)

## 2022-12-23 MED ORDER — LIDOCAINE VISCOUS HCL 2 % MT SOLN
15.0000 mL | Freq: Four times a day (QID) | OROMUCOSAL | 0 refills | Status: DC | PRN
  Filled 2022-12-23: qty 100, 2d supply, fill #0

## 2022-12-23 MED ORDER — LIDOCAINE VISCOUS HCL 2 % MT SOLN: 15.0000 mL | Freq: Four times a day (QID) | OROMUCOSAL | 0 refills | Status: DC | PRN

## 2022-12-23 NOTE — Assessment & Plan Note (Addendum)
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  She was most recently seen for ongoing breast redness, given a course of antibiotics but had persistent redness hence we proceeded with a punch biopsy.  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  Pathology was sent to [facility] for second opinion as well. This confirmed triple negative breast cancer.  She progressed on after 2 cycles of TC hence we have switched her treatment to Enhertu.   Repeat biopsy demonstrated her HER2 to be 1+.  Given locally advanced and unresectable breast cancer and the progression on TC and previous exposure to Adriamycin based regimen, we decided to try Enhertu for HER2 low disease and she had robust response.  She is now status post left mastectomy and axillary resection and there is no evidence of residual tumor, complete pathologic response obtained.  Since this is a recurrent breast cancer and high risk for systemic disease, I have discussed about adjuvant Xeloda based on the create X trial I will start her on adjuvant Xeloda after completion of radiation.  Once again we discussed the excellent response achieved so far, role of antiestrogen therapy like tamoxifen as well as adjuvant chemotherapy leg Xeloda. She completed adjuvant radiation.  She is now on adjuvant Xeloda at 1000 mg/m (1250 mg/m was original dose used and create X) twice daily 2 weeks on 1 week off for 6-8 cycles.  She is here for tox check on xeloda. She complains of fatigue, stomach pain, burning pain of soles from xeloda. She says if the pain in the soles is any stronger than what its now, she may not be able to walk. Stomach pain is in the lower abdomen, some times after this intense pain, she has a BM but not always.  Since she complains of severe burning pain in the feet, we decreased the  dose to 2000 mg AM and 1500 mg PM. If this doesn't alleviate the pain after 7-10 days, she can reduce the dose to 1500 mg AM and 1500 mg PM. For lower abdominal pain, we will continue to monitor especially with dose reduction.  For mouth pain, sent prescription viscous lidocaine. For GERD, she will take prilosec AM Dry eyes, she can use artificial tears  Labs today. FU in 3 weeks with repeat labs.

## 2022-12-23 NOTE — Progress Notes (Signed)
[facility] Cancer Follow up:    Monica Andrew, NP [address] [place] [address]   DIAGNOSIS:  Cancer Staging  Malignant neoplasm of upper-outer quadrant of left breast in female, estrogen receptor positive Staging form: Breast, AJCC 8th Edition - Clinical stage from 01/29/2020: Stage IIIA (cT2, cN1, cM0, G3, ER+, PR-, HER2-) - Signed by Rachel Moulds, MD on 06/28/2022 Stage prefix: Initial diagnosis Histologic grading system: 3 grade system - Pathologic stage from 07/15/2020: No Stage Recommended (ypT1b, pN0, cM0, G3, ER-, PR-, HER2-) - Signed by Loa Socks, NP on 04/01/2022 Stage prefix: Post-therapy Histologic grading system: 3 grade system   SUMMARY OF ONCOLOGIC HISTORY: Oncology History  Malignant neoplasm of upper-outer quadrant of left breast in female, estrogen receptor positive  01/23/2020 Initial Diagnosis   [place] woman status post left breast upper outer quadrant biopsy 01/23/2020 for a clinical T3 N1, 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             (a) chest CT scan and bone scan 02/07/2020 showed no evidence of metastatic disease   01/29/2020 Cancer Staging   Staging form: Breast, AJCC 8th Edition - Clinical stage from 01/29/2020: Stage IIIA (cT2, cN1, cM0, G3, ER+, PR-, HER2-) - Signed by Rachel Moulds, MD on 06/28/2022 Stage prefix: Initial diagnosis Histologic grading system: 3 grade system   02/07/2020 Genetic Testing   Negative genetic testing. No pathogenic variants identified on the [facility] Common Hereditary Cancers Panel. VUS in MSH6 called c.2744C>G identified. The report date is 02/07/2020.  The Common Hereditary Cancers Panel offered by [facility] includes sequencing and/or deletion duplication testing of the following 48 genes: APC, ATM, AXIN2, BARD1, BMPR1A, BRCA1, BRCA2, BRIP1, CDH1, CDKN2A (p14ARF), CDKN2A (p16INK4a), CKD4, CHEK2, CTNNA1, DICER1,  EPCAM (Deletion/duplication testing only), GREM1 (promoter region deletion/duplication testing only), KIT, MEN1, MLH1, MSH2, MSH3, MSH6, MUTYH, NBN, NF1, NHTL1, PALB2, PDGFRA, PMS2, POLD1, POLE, PTEN, RAD50, RAD51C, RAD51D, RNF43, SDHB, SDHC, SDHD, SMAD4, SMARCA4. STK11, TP53, TSC1, TSC2, and VHL.  The following genes were evaluated for sequence changes only: SDHA and HOXB13 c.251G>A variant only.   02/12/2020 - 05/28/2020 Neo-Adjuvant Chemotherapy   neoadjuvant chemotherapy consisting of doxorubicin and cyclophosphamide in dose dense fashion x4 started 02/12/2020, completed 03/25/2020, followed by paclitaxel and carboplatin weekly x12 started 04/08/2020 and completed on 05/28/2020             (a) echo 02/07/2020 shows an ejection fraction in the 55-60% range             (b) Epirubicin substituted for Doxorubicin starting with cycle 2 of AC secondary to allergic rash from Doxorubicin   07/15/2020 Surgery   status post left lumpectomy and sentinel lymph node sampling 07/15/2020 for a ypT1b ypN0 residual invasive ductal carcinoma, grade 3, with negative margins.             (a) a total of 4 left axillary lymph nodes were removed             (b) repeat prognostic panel on the final pathology again triple negative, with an MIB-1 of 50%.   07/15/2020 Cancer Staging   Staging form: Breast, AJCC 8th Edition - Pathologic stage from 07/15/2020: No Stage Recommended (ypT1b, pN0, cM0, G3, ER-, PR-, HER2-) - Signed by Loa Socks, NP on 04/01/2022 Stage prefix: Post-therapy Histologic grading system: 3 grade system   08/26/2020 - 10/09/2020 Radiation Therapy   adjuvant radiation completed 10/09/2020             (  a) sensitizing capecitabine attempted but not tolerated by the patient  08/26/2020 through 10/09/2020 Site Technique Total Dose (Gy) Dose per Fx (Gy) Completed Fx Beam Energies  Breast, Left: Breast_Lt 3D 50/50 2 25/25 15X  Breast, Left: Breast_Lt_PAB_SCV 3D 50/50 2 25/25 10X, 15X  Breast,  Left: Breast_Lt_Bst 3D 10/10 2 5/5 6X, 10X    11/23/2020 - 01/04/2021 Adjuvant Chemotherapy   adjuvant pembrolizumab started 11/23/2020, to continue for 1 year             (a) discontinued after 01/04/2021 dose with multiple side effects requiring steroids for resolution   03/30/2022 Mammogram   Mammogram and ultrasound show numerous masses throughout left breast not visualized in 01/2022, +left axillary lymphadenopathy, skin thickening in right breast and in upper abdomen just below sternum   04/05/2022 - 07/20/2022 Chemotherapy   Patient is on Treatment Plan : BREAST METASTATIC Fam-Trastuzumab Deruxtecan-nxki (Enhertu) (5.4) q21d     04/06/2022 - 04/26/2022 Chemotherapy   Patient is on Treatment Plan : BREAST METASTATIC fam-trastuzumab deruxtecan-nxki (Enhertu) q21d     11/25/2022 -  Chemotherapy   Patient is on Treatment Plan : BREAST Capecitabine q21d     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  Prognostic panel sent to [facility]: Left breast skin, punch biopsy: Skin with dermal lymphatic involvement by carcinoma (confirmed with outside CK7 IHC, reviewed). Biomarkers are reported as negative for ER, PR, Her2/neu (confirmed on review but please note sample size is extremely small).   02/15/2022 - 03/08/2022 Chemotherapy    Taxotere/Cytoxan x 2, discontinued due to progression of breast.   03/30/2022 Mammogram   Mammogram and ultrasound show numerous masses throughout left breast not visualized in 01/2022, +left axillary lymphadenopathy, skin thickening in right breast and in upper abdomen just below sternum   04/05/2022 - 07/20/2022 Chemotherapy   Patient is on Treatment Plan : BREAST METASTATIC Fam-Trastuzumab Deruxtecan-nxki (Enhertu) (5.4) q21d     04/06/2022 - 04/26/2022 Chemotherapy   Patient is on Treatment Plan : BREAST METASTATIC fam-trastuzumab deruxtecan-nxki (Enhertu) q21d     11/25/2022 -  Chemotherapy    Patient is on Treatment Plan : BREAST Capecitabine q21d      INTERVAL HISTORY:  [name] 47 y.o. female returns for follow-up of her breast cancer.   She is now post surgery, had left mastectomy, lymph axillary excision.  Her most recent surgery did not show any further evidence of disease after Enhertu.   However given her very high risk of recurrence, we have discussed about considering adjuvant treatment with Xeloda. She is now on xeloda.  She says Xeloda is giving her some headaches, stomach pain, itching when she is near a heat source, burning pain of the palms and soles. When she gets stomach pain, she says it is really strong, lower abdomen and it lasts about 3 min. Sometimes discomfort last for a day or so. She reports some color change in the lips as well, they appear darker she says. She has noted some ulcers in mouth. She says the ulcers are starting to get bothersome.   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 04/01/2022   Encounter for dental examination 03/11/2022   Acute apical  periodontitis 03/11/2022   Phobia of dental procedure 03/11/2022   Defective dental restoration 03/11/2022   Accretions on teeth 03/11/2022   Chronic periodontitis 03/11/2022   Teeth missing 03/11/2022   Caries 03/11/2022   Generalized gingival recession 03/11/2022   Pain, dental 03/08/2022   Cancer-related pain 03/08/2022   Port-A-Cath in place 02/15/2022   Oral allergy syndrome, subsequent encounter 02/03/2022   Adverse effect of other drugs, medicaments and biological substances, subsequent encounter 02/02/2022   Other adverse food reactions, not elsewhere classified, subsequent encounter 02/02/2022   Chest tightness 02/02/2022   Other  allergic rhinitis 02/02/2022   Allergic conjunctivitis of both eyes 02/02/2022   Language barrier 12/11/2020   Morbid obesity    History of breast cancer    Genetic testing 02/13/2020   Malignant neoplasm of upper-outer quadrant of left breast in female, estrogen receptor positive 01/27/2020   Impaired ability to use community resources due to language barrier 07/13/2013    is allergic to omnipaque [iohexol], doxorubicin hcl, keytruda [pembrolizumab], other, and tape.  MEDICAL HISTORY: Past Medical History:  Diagnosis Date   Allergy    Asthma    Breast cancer in female    Left   Breast disorder    breast cancer May 2021   Chronic back pain    Headache    High triglycerides    History of breast cancer    History of COVID-19 04/20/2021   Hx of migraines    Morbid obesity    Personal history of chemotherapy    Personal history of radiation therapy     SURGICAL HISTORY: Past Surgical History:  Procedure Laterality Date   BREAST LUMPECTOMY     BREAST LUMPECTOMY WITH RADIOACTIVE SEED AND SENTINEL LYMPH NODE BIOPSY Left 07/15/2020   Procedure: LEFT BREAST LUMPECTOMY WITH RADIOACTIVE SEED AND LEFT AXILLARY SENTINEL LYMPH NODE BIOPSY, LEFT AXILLARY NODE RADIOACTIVE SEED GUIDED EXCISION, LEFT BREAST RADIOACTIVE SEED GUIDED EXCISION IM NODE;  Surgeon: Emelia Loron, MD;  Location: [facility];  Service: General;  Laterality: Left;  PEC BLOCK   CESAREAN SECTION WITH BILATERAL TUBAL LIGATION Bilateral 07/23/2015   Procedure: CESAREAN SECTION WITH BILATERAL TUBAL LIGATION;  Surgeon: Reva Bores, MD;  Location: [facility];  Service: Obstetrics;  Laterality: Bilateral;   IR IMAGING GUIDED PORT INSERTION  02/11/2022   MODIFIED MASTECTOMY Left 08/22/2022   Procedure: LEFT MODIFIED RADICAL MASTECTOMY, LEFT AXILLARY NODE DISSECTION, AND SKIN BIOPSY OF BACK;  Surgeon: Emelia Loron, MD;  Location: [facility];  Service: General;  Laterality: Left;   PORT-A-CATH REMOVAL N/A 04/30/2021   Procedure:  REMOVAL PORT-A-CATH;  Surgeon: Emelia Loron, MD;  Location: [facility];  Service: General;  Laterality: N/A;   PORTACATH PLACEMENT Right 02/11/2020   Procedure: INSERTION PORT-A-CATH WITH ULTRASOUND GUIDANCE;  Surgeon: Emelia Loron, MD;  Location: [facility];  Service: General;  Laterality: Right;   TUBAL LIGATION      SOCIAL HISTORY: Social History   Socioeconomic History   Marital status: Legally Separated    Spouse name: Not on file   Number of children: 3   Years of education: Not on file   Highest education level: 9th grade  Occupational History   Occupation: Holiday representative  Tobacco Use   Smoking status: Never   Smokeless tobacco: Never  Vaping Use   Vaping Use: Never used  Substance and Sexual Activity   Alcohol use: No   Drug use: Never   Sexual activity: Yes    Birth control/protection: Surgical  Other  Topics Concern   Not on file  Social History Narrative   Not on file   Social Determinants of Health   Financial Resource Strain: Not on file  Food Insecurity: No Food Insecurity (10/11/2022)   Hunger Vital Sign    Worried About Running Out of Food in the Last Year: Never true    Ran Out of Food in the Last Year: Never true  Transportation Needs: No Transportation Needs (10/11/2022)   PRAPARE - Administrator, Civil Service (Medical): No    Lack of Transportation (Non-Medical): No  Physical Activity: Not on file  Stress: Not on file  Social Connections: Not on file  Intimate Partner Violence: Not At Risk (10/11/2022)   Humiliation, Afraid, Rape, and Kick questionnaire    Fear of Current or Ex-Partner: No    Emotionally Abused: No    Physically Abused: No    Sexually Abused: No    FAMILY HISTORY: Family History  Problem Relation Age of Onset   Heart disease Maternal Grandmother     Review of Systems  Constitutional:  Positive for fatigue. Negative for appetite change, chills, fever and unexpected weight  change.  HENT:   Negative for hearing loss, lump/mass and trouble swallowing.   Eyes:  Negative for eye problems and icterus.  Respiratory:  Negative for chest tightness, cough and shortness of breath.   Cardiovascular:  Negative for chest pain, leg swelling and palpitations.  Gastrointestinal:  Negative for abdominal distention, abdominal pain, constipation, diarrhea, nausea and vomiting.  Endocrine: Negative for hot flashes.  Genitourinary:  Negative for difficulty urinating.   Musculoskeletal:  Positive for arthralgias.  Skin:  Positive for rash. Negative for itching.  Neurological:  Negative for dizziness, extremity weakness, headaches and numbness.  Hematological:  Negative for adenopathy. Does not bruise/bleed easily.  Psychiatric/Behavioral:  Negative for depression. The patient is not nervous/anxious.       PHYSICAL EXAMINATION  ECOG PERFORMANCE STATUS: 1 - Symptomatic but completely ambulatory  Vitals:   12/23/22 0919  BP: 118/84  Pulse: 74  Resp: 16  Temp: 97.9 F (36.6 C)  SpO2: 98%     Physical Exam Constitutional:      Appearance: Normal appearance.  Chest:     Comments: Ongoing skin changes from radiation with some skin desquamation. No infection  Neurological:     Mental Status: She is alert.      LABORATORY DATA:  None today  RADIOGRAPHIC STUDIES:  No results found.    ASSESSMENT and THERAPY PLAN:   Malignant neoplasm of upper-outer quadrant of left breast in female, estrogen receptor positive (HCC) Yoko is a 47 year old Spanish-speaking woman who has a 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  She was most recently seen for ongoing breast redness, given a course of antibiotics but had persistent redness hence we proceeded with a punch biopsy.  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  Pathology was sent to [facility] for second  opinion as well. This confirmed triple negative breast cancer.  She progressed on after 2 cycles of TC hence we have switched her treatment to Enhertu.   Repeat biopsy demonstrated her HER2 to be 1+.  Given locally advanced and unresectable breast cancer and the progression on TC and previous exposure to Adriamycin based regimen, we decided to try Enhertu for HER2 low disease and she had robust response.  She is now status post left mastectomy  and axillary resection and there is no evidence of residual tumor, complete pathologic response obtained.  Since this is a recurrent breast cancer and high risk for systemic disease, I have discussed about adjuvant Xeloda based on the create X trial I will start her on adjuvant Xeloda after completion of radiation.  Once again we discussed the excellent response achieved so far, role of antiestrogen therapy like tamoxifen as well as adjuvant chemotherapy leg Xeloda. She completed adjuvant radiation.  She is now on adjuvant Xeloda at 1000 mg/m (1250 mg/m was original dose used and create X) twice daily 2 weeks on 1 week off for 6-8 cycles.  She is here for tox check on xeloda. She complains of fatigue, stomach pain, burning pain of soles from xeloda. She says if the pain in the soles is any stronger than what its now, she may not be able to walk. Stomach pain is in the lower abdomen, some times after this intense pain, she has a BM but not always.  Since she complains of severe burning pain in the feet, we decreased the dose to 2000 mg AM and 1500 mg PM. If this doesn't alleviate the pain after 7-10 days, she can reduce the dose to 1500 mg AM and 1500 mg PM. For lower abdominal pain, we will continue to monitor especially with dose reduction.  For mouth pain, sent prescription viscous lidocaine. For GERD, she will take prilosec AM Dry eyes, she can use artificial tears  Labs today. FU in 3 weeks with repeat labs.   She has so much going on with  chemotherapy, we didn't get to talk about osteoporosis. All questions were answered. The patient knows to call the clinic with any problems, questions or concerns. We can certainly see the patient much sooner if necessary.  Total time spent: 40 minutes.  Certified Spanish interpreter was used for the entirety of this conversation  *Total Encounter Time as defined by the Centers for Medicare and Medicaid Services includes, in addition to the face-to-face time of a patient visit (documented in the note above) non-face-to-face time: obtaining and reviewing outside history, ordering and reviewing medications, tests or procedures, care coordination (communications with other health care professionals or caregivers) and documentation in the medical record.

## 2022-12-24 LAB — CANCER ANTIGEN 27.29: CA 27.29: <9 U/mL (ref 0.0–38.6)

## 2022-12-28 ENCOUNTER — Encounter: Payer: Self-pay | Admitting: Physical Therapy

## 2022-12-28 ENCOUNTER — Ambulatory Visit: Payer: No Typology Code available for payment source | Admitting: Physical Therapy

## 2022-12-28 DIAGNOSIS — Z483 Aftercare following surgery for neoplasm: Secondary | ICD-10-CM

## 2022-12-28 DIAGNOSIS — R293 Abnormal posture: Secondary | ICD-10-CM

## 2022-12-28 DIAGNOSIS — I972 Postmastectomy lymphedema syndrome: Secondary | ICD-10-CM

## 2022-12-28 DIAGNOSIS — M25612 Stiffness of left shoulder, not elsewhere classified: Secondary | ICD-10-CM

## 2022-12-28 NOTE — Therapy (Signed)
OUTPATIENT PHYSICAL THERAPY ONCOLOGY TREATMENT  Patient Name: [name] MRN: [id number] DOB:02-28-1976, 47 y.o., female Today's Date: 12/28/2022  END OF SESSION:  PT End of Session - 12/28/22 1205     Visit Number 22    Number of Visits 35    Date for PT Re-Evaluation 02/07/23    PT Start Time 1204    PT Stop Time 1259    PT Time Calculation (min) 55 min    Activity Tolerance Patient tolerated treatment well    Behavior During Therapy [facility] for tasks assessed/performed                      Past Medical History:  Diagnosis Date   Allergy    Asthma    Breast cancer in female    Left   Breast disorder    breast cancer May 2021   Chronic back pain    Headache    High triglycerides    History of breast cancer    History of COVID-19 04/20/2021   Hx of migraines    Morbid obesity    Personal history of chemotherapy    Personal history of radiation therapy    Past Surgical History:  Procedure Laterality Date   BREAST LUMPECTOMY     BREAST LUMPECTOMY WITH RADIOACTIVE SEED AND SENTINEL LYMPH NODE BIOPSY Left 07/15/2020   Procedure: LEFT BREAST LUMPECTOMY WITH RADIOACTIVE SEED AND LEFT AXILLARY SENTINEL LYMPH NODE BIOPSY, LEFT AXILLARY NODE RADIOACTIVE SEED GUIDED EXCISION, LEFT BREAST RADIOACTIVE SEED GUIDED EXCISION IM NODE;  Surgeon: Emelia Loron, MD;  Location: [facility];  Service: General;  Laterality: Left;  PEC BLOCK   CESAREAN SECTION WITH BILATERAL TUBAL LIGATION Bilateral 07/23/2015   Procedure: CESAREAN SECTION WITH BILATERAL TUBAL LIGATION;  Surgeon: Reva Bores, MD;  Location: [facility];  Service: Obstetrics;  Laterality: Bilateral;   IR IMAGING GUIDED PORT INSERTION  02/11/2022   MODIFIED MASTECTOMY Left 08/22/2022   Procedure: LEFT MODIFIED RADICAL MASTECTOMY, LEFT AXILLARY NODE DISSECTION, AND SKIN BIOPSY OF BACK;  Surgeon: Emelia Loron, MD;  Location: [facility];  Service: General;  Laterality: Left;   PORT-A-CATH REMOVAL N/A  04/30/2021   Procedure: REMOVAL PORT-A-CATH;  Surgeon: Emelia Loron, MD;  Location: [facility];  Service: General;  Laterality: N/A;   PORTACATH PLACEMENT Right 02/11/2020   Procedure: INSERTION PORT-A-CATH WITH ULTRASOUND GUIDANCE;  Surgeon: Emelia Loron, MD;  Location: [facility];  Service: General;  Laterality: Right;   TUBAL LIGATION     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 04/01/2022   Encounter for dental examination 03/11/2022   Acute apical periodontitis 03/11/2022   Phobia of dental procedure 03/11/2022   Defective dental restoration 03/11/2022   Accretions on teeth 03/11/2022   Chronic periodontitis 03/11/2022   Teeth missing 03/11/2022   Caries 03/11/2022   Generalized gingival recession 03/11/2022   Pain, dental 03/08/2022   Cancer-related pain 03/08/2022   Port-A-Cath in place 02/15/2022   Oral allergy syndrome, subsequent encounter 02/03/2022   Adverse effect of other drugs, medicaments and biological substances, subsequent encounter 02/02/2022   Other adverse food reactions, not elsewhere classified, subsequent encounter 02/02/2022   Chest tightness 02/02/2022   Other allergic rhinitis 02/02/2022  Allergic conjunctivitis of both eyes 02/02/2022   Language barrier 12/11/2020   Morbid obesity    History of breast cancer    Genetic testing 02/13/2020   Malignant neoplasm of upper-outer quadrant of left breast in female, estrogen receptor positive 01/27/2020   Impaired ability to use community resources due to language barrier 07/13/2013    PCP: Angus Seller, NP  REFERRING PROVIDER: Rachel Moulds, MD  REFERRING DIAG: Left UE lymphedema  THERAPY DIAG:  Postmastectomy  lymphedema  Stiffness of left shoulder, not elsewhere classified  Aftercare following surgery for neoplasm  Abnormal posture  ONSET DATE: 08/01/22  Rationale for Evaluation and Treatment: Rehabilitation  SUBJECTIVE:                                                                                                                                                                                           SUBJECTIVE STATEMENT:  I called the DME company to pay over the phone and I have to call them back.   PERTINENT HISTORY:  Patient was diagnosed on 01/23/2020 with left grade III invasive ductal carcinoma breast cancer. It is ER positive, PR negative, and HER2 negative , but functionally Triple Negative,with a Ki67 of 40%. Patient with interpreter present. She underwent neoadjuvant chemotherapy from 02/12/2020 - 05/28/2020. 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 She underwent a left lumpectomy and sentinel node biopsy ( 4 negative nodes) on 07/15/2020. She underwent radiation and anti-estrogen therapy.  She had interval development of diffuse skin thickening in 2023. She had a punch biopsy of her skin at that time and this was consistent with inflammatory breast cancer. She has been treated with chemo since then. She had a left modified radical mastectomy on 08/22/2022 with complete therapeutic response and 0/1 LN. She noted some swelling in her left arm and on 08/25/2022 she had an [place] that was negative for DVT. Her SOZO screen on 09/19/2021 was 6.9 points above baseline putting her in the yellow zone and she was given a compression sleeve that was not containing  her so a flat knit sleeve and glove and nighttime garment  have been ordered   PAIN:  Are you having pain? Burning sensation in bilateral hands and feet since beginning the chemo pill   PRECAUTIONS: Other: lymphedema [name]  WEIGHT BEARING RESTRICTIONS: No  FALLS:  Has patient fallen in last 6 months? 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 Fullness in left lateral chest and back as well as left arm and hand especially on thumb side   OBSERVATIONS / OTHER ASSESSMENTS: scar well healed, increased edema at lateral trunk and throughout [name] reddened area in radiation field with skin appearing fragile and irritated but no open areas   POSTURE: forward head, rounded shoulders  UPPER EXTREMITY AROM/PROM:  A/PROM RIGHT   eval   Shoulder extension 68  Shoulder flexion 160  Shoulder abduction 175  Shoulder internal rotation 56  Shoulder external rotation 88    (Blank rows = not tested)  A/PROM LEFT   eval LEFT 11/10/22 LEFT 12/06/22 LEFT 12/08/2022 LEFT 12/19/2022  Shoulder extension 52      Shoulder flexion 146 150 156 155 165  Shoulder abduction 148 160 164 170 170  Shoulder internal rotation 67      Shoulder external rotation 86        (Blank rows = not tested)   LYMPHEDEMA ASSESSMENTS:  SURGERY TYPE/DATE: Left Lumpectomy with SLNB11/11/2019, Left modified mastectomy with left axillary node dissection NUMBER OF LYMPH NODES REMOVED: 0/4 with lumpectomy, 0/1 during Mastectomy CHEMOTHERAPY: yes RADIATION:Yes HORMONE TREATMENT: may require INFECTIONS: none  LYMPHEDEMA ASSESSMENTS:  LANDMARK RIGHT  eval RIGHT 11/08/22  10 cm proximal to olecranon process 31.4 34.4   Olecranon process 27.2 28  10  cm proximal to ulnar styloid process 25.7 25.5  Just proximal to ulnar styloid process 18.4 19  Across hand at thumb web space 20 20  At base of 2nd digit 6.4 6.5  (Blank rows = not tested)  LANDMARK LEFT  eval LEFT 10/25/22 Left  11/03/22 LEFT 11/08/22 LEFT  12/08/2022   10 cm proximal to olecranon process 33 33 31.9 33 34.3  Olecranon process 28.4 28.5 28.5 29 28.7  10 cm proximal to ulnar styloid process 26 26.5 26.4 26.6 26.5  Just proximal to ulnar styloid process 19.4 19 18.9 19.1 18.2  Across hand at thumb web space 21 20.5 19.7 20.7 19.5  2nd digit    6.7 6.5   TODAY'S TREATMENT:   12/28/2022  MLD: Short neck, L 5 diaphragmatic breaths, bil axillary nodes and establishment of interaxillary pathway, Lt inguinal nodes, Lt axillo-inguinal anastomosis spending increased time in area of fullness inferior to axilla, L UE working proximal to distal, then retracing all steps.  Educated pt about availability of compression tank tops and educated her that this may help since her compression bra has not been helping lateral trunk edema and digs in right below her swelling Compression Bandaging: TG soft from hand to axilla,  mollelast to digits 1-4 with  , artiflex from hand to axilla, 1 6 cm bandage at hand, 1 8 cm bandage from wrist to axilla with x at antecubital fossa and 1 12 cm bandage from wrist to axilla   12/21/2022: Rep from [employer] in clinic today. Arrangements were made for rep to come to our clinic at 11:00 on 01/03/2023 to do the demonstration for the [employer] and fill out the needed paperwork to see if pt qualifies for a donated garment. Also checked  on the sleeve as the [employer] portal says that they are waiting for payment and pt given information to contact them about it.  Pt askes for MLD only as she cannot do that at home so it was performed as on 12/19/2022 with extra time and stretch to anterior chest and MLD at fullness at anterior and posterior  axilla.    12/19/2022  MLD: Short neck, L 5 diaphragmatic breaths, bil axillary nodes and establishment of interaxillary pathway, Lt inguinal nodes, Lt axillo-inguinal anastomosis L UE working proximal to distal especially at thumb side of hand with arm elevated. then retracing all steps. Then to sidelying for more prolonged work on posterior interaxillary anastamosis with gentle pressure from one hand on lateral chest and axilla. In sidelying, small circles with hand pointed to ceiling, shoulder flexion and external rotation. X 10  Talked with pt about [employer] but she says she has not heard from them yet, but may have not picked up their call.  Gave pt [employer] card and asked to call them to check on it herself ( with assist from English speaking family) Reinforced scar massage and skin stretch at anterior chest since she is healed from radiation   12/15/2022  MLD: Short neck, L 5 diaphragmatic breaths, bil axillary nodes and establishment of interaxillary pathway, Lt inguinal nodes, Lt axillo-inguinal anastomosis avoiding radiation field tenderness, L UE working proximal to distal, then retracing all steps. Then to sidelying for more prolonged work on Merchandiser, retail. supine dowel stretch in flexion and abduction In sidelying, small circles with hand pointed to ceiling,x 5  short range abduction abduction with straight arm, shoulder flexion and external rotation. X 10  Compression Bandaging: TG soft from hand to axilla,  mollelast to digits 1-5 with  , gray foam over dorsum of hand and wrist, artiflex from hand to axilla, 1 6 cm bandage at hand, 1 8 cm bandage from wrist to axilla with x at antecubital fossa and 1 12 cm bandage from wrist to axilla   12/06/2022 MLD: Short neck, L 5 diaphragmatic breaths, R axillary nodes and establishment of interaxillary pathway, Lt inguinal nodes, Lt axillo-inguinal anastomosis avoiding radiation field, L UE working proximal to distal, then  retracing all steps.  PROM to L shoulder gentle in to flexion and abduction making sure to avoid any pulling at fragile skin Compression Bandaging: TG soft from hand to axilla,  mollelast to digits 1-4 with  , artiflex from hand to axilla, 1 6 cm bandage at hand, 1 8 cm bandage from wrist to axilla in herringbone fashion and 1 12 cm bandage from wrist to axilla   12/01/2022 MLD: Short neck, L 5 diaphragmatic breaths, R axillary nodes and establishment of interaxillary pathway, Lt inguinal nodes, Lt axillo-inguinal anastomosis avoiding radiation field, L UE working proximal to distal, then to R s/l to work on L lateral trunk moving fluid towards pathways then back to supine retracing all steps.  PROM to L shoulder gentle in to flexion and abduction making sure to avoid any pulling at fragile skin  11/29/2022 MLD: Short neck, L 5 diaphragmatic breaths, R axillary nodes and establishment of interaxillary pathway, Lt inguinal nodes, Lt axillo-inguinal anastomosis avoiding radiation field, L UE working proximal to distal, then to R s/l to work on L lateral trunk moving fluid towards pathways then back to supine retracing all steps.  PROM to L shoulder gentle in to flexion and abduction making sure to avoid any pulling at fragile skin Compression Bandaging: TG  soft from hand to axilla,  mollelast to digits 1-5 with  , gray foam over dorsum of hand and wrist, artiflex from hand to axilla, 1 6 cm bandage at hand, 1 8 cm bandage from wrist to axilla with x at antecubital fossa and 1 12 cm bandage from wrist to axilla    11/24/2022 MLD: Short neck, Lt inguinal nodes, Lt axillo-inguinal anastomosis, avoiding radiation field, L UE working proximal to distal then retracing all steps. Did not work on lateral trunk due to compromised skin from radiation Compression Bandaging: TG soft from hand to axilla,  mollelast to digits 1-5 with  , gray foam over dorsum of hand and wrist, artiflex from hand to axilla, 1 6 cm  bandage at hand, 1 8 cm bandage from wrist to axilla with x at antecubital fossa and 1 12 cm bandage from wrist to axilla                                                                                                                                  PATIENT EDUCATION:  Education details:supine dowel flexion and abduction, Person educated: Patient Education method: Actor given  Education comprehension: verbalized understanding  HOME EXERCISE PROGRAM: Obtain compression bra, continue to wear sleeve and glove  ASSESSMENT:  CLINICAL IMPRESSION: Discussed with pt that she may benefit from a compression tank top that is commercially available to help with her lateral trunk edema that has not been decreasing with the compression bra. Continued with MLD with more time spent in this area to help decongest to help with decongestion of her UE lymphedema then focused on [name] followed by bandaging.   OBJECTIVE IMPAIRMENTS: decreased knowledge of condition, decreased ROM, decreased strength, increased edema, increased fascial restrictions, impaired UE functional use, postural dysfunction, and pain.   ACTIVITY LIMITATIONS: carrying, lifting, and reach over head  PARTICIPATION LIMITATIONS: cleaning, laundry, community activity, occupation, and yard work  PERSONAL FACTORS: Time since onset of injury/illness/exacerbation are also affecting patient's functional outcome.   REHAB POTENTIAL: Good  CLINICAL DECISION MAKING: Stable/uncomplicated  EVALUATION COMPLEXITY: Low  GOALS: Goals reviewed with patient? Yes   SHORT TERM GOALS = LONG TERM GOALS: Target date: 11/09/22  Pt will be independent in self MLD for long term management of lymphedema.  Baseline:  Goal status: IN PROGRESS   2.  Pt will obtain appropriate compression garments for long term management of lymphedema.  Baseline:  Goal status: IN PROGRESS  3.  Pt will demonstrate 160 degrees of L shoulder flexion to allow her  to reach overhead. Baseline:  Goal status: IN PROGRESS 11/09/2022:  155  4. Pt will demonstrate 170 degrees of L shoulder abduction to allow her to reach out to the side.   Goal: IN PROGRESS 12/08/22 - 164  5. Pt will be independent in a home exercise program for continued stretching and strengthening.  Goal: IN PROGRESS   6. Pt will report she is  able to raise her L arm to end range wihtout increased pain in her axilla to allow improved comfort.   GOAL: IN PROGRESS - pt still having pain   PLAN:  PT FREQUENCY:1- 2x/week  PT DURATION: 8 weeks   PLANNED INTERVENTIONS: Therapeutic exercises, Therapeutic activity, Patient/Family education, Self Care, Joint mobilization, Manual lymph drainage, Compression bandaging, scar mobilization, Taping, Vasopneumatic device, and Manual therapy  PLAN FOR NEXT SESSION cont MLD to L UE and lateral trunk and, progress exercises to include stretching strengthening , make sure garments fit and pt is able to manage her self care at home prior to discharge   Treatment on 01/03/2023 for [employer] demonstration and paperwork completion needed for donated garment.     Milagros Loll Rutledge, [place] 12/28/2022, 1:20 PM [facility] [address] [place], [place], [address] Phone: [phone]   Fax:  [phone]

## 2022-12-29 ENCOUNTER — Ambulatory Visit: Payer: No Typology Code available for payment source | Admitting: Radiation Oncology

## 2022-12-29 ENCOUNTER — Telehealth: Payer: Self-pay

## 2022-12-29 ENCOUNTER — Ambulatory Visit
Admission: RE | Admit: 2022-12-29 | Discharge: 2022-12-29 | Disposition: A | Discharge status: Home / Self Care | Payer: Self-pay | Source: Ambulatory Visit | Attending: Radiation Oncology | Admitting: Radiation Oncology

## 2022-12-29 NOTE — Telephone Encounter (Signed)
Rn attempted to call pt back again through spanish interpreter via telephone. A second voicemail was left for pt. Rn will call back this afternoon to attempt communication again.

## 2022-12-29 NOTE — Telephone Encounter (Signed)
RN attempted to call pt for telephone follow up with assistance of spanish interpreter via telephone. Voicemail was left for pt through translator. Rn will attempt to call back in a few minutes as well.

## 2022-12-29 NOTE — Telephone Encounter (Signed)
Third message left for pt with spanish interpreter via telephone. RN will attempt to call tomorrow for telephone follow up for this pt.

## 2022-12-30 ENCOUNTER — Telehealth: Payer: Self-pay

## 2022-12-30 ENCOUNTER — Ambulatory Visit: Payer: No Typology Code available for payment source | Admitting: Radiation Oncology

## 2022-12-30 ENCOUNTER — Ambulatory Visit: Payer: Self-pay | Admitting: Radiation Oncology

## 2022-12-30 ENCOUNTER — Ambulatory Visit: Payer: Self-pay | Admitting: Nurse Practitioner

## 2022-12-30 NOTE — Telephone Encounter (Signed)
Rn attempted to call pt again today for follow up without success. Rn did use assistance of spanish interpreter for this phone call attempt.

## 2023-01-03 ENCOUNTER — Ambulatory Visit: Payer: No Typology Code available for payment source | Admitting: Physical Therapy

## 2023-01-03 ENCOUNTER — Telehealth: Payer: Self-pay

## 2023-01-03 ENCOUNTER — Encounter: Payer: Self-pay | Admitting: Physical Therapy

## 2023-01-03 DIAGNOSIS — I972 Postmastectomy lymphedema syndrome: Secondary | ICD-10-CM

## 2023-01-03 DIAGNOSIS — R293 Abnormal posture: Secondary | ICD-10-CM

## 2023-01-03 DIAGNOSIS — M25612 Stiffness of left shoulder, not elsewhere classified: Secondary | ICD-10-CM

## 2023-01-03 DIAGNOSIS — Z483 Aftercare following surgery for neoplasm: Secondary | ICD-10-CM

## 2023-01-03 NOTE — Telephone Encounter (Signed)
Rn attempted to call pt for telephone follow up without success. Voicemail was left for pt through language line Rn would call on Thursday 01-05-23 at 3pm.

## 2023-01-03 NOTE — Therapy (Signed)
OUTPATIENT PHYSICAL THERAPY ONCOLOGY TREATMENT  Patient Name: [name] MRN: [id number] DOB:[birth year], 47 y.o., female Today's Date: 01/03/2023  END OF SESSION:  PT End of Session - 01/03/23 1126     Visit Number 23    Number of Visits 35    Date for PT Re-Evaluation 02/07/23    Authorization Type CAFA expires 7/28    PT Start Time 1103    PT Stop Time 1202   only 10 min billable, rest Flexi demo   PT Time Calculation (min) 59 min    Activity Tolerance Patient tolerated treatment well    Behavior During Therapy [facility] for tasks assessed/performed                      Past Medical History:  Diagnosis Date   Allergy    Asthma    Breast cancer in female    Left   Breast disorder    breast cancer May 2021   Chronic back pain    Headache    High triglycerides    History of breast cancer    History of COVID-19 04/20/2021   Hx of migraines    Morbid obesity    Personal history of chemotherapy    Personal history of radiation therapy    Past Surgical History:  Procedure Laterality Date   BREAST LUMPECTOMY     BREAST LUMPECTOMY WITH RADIOACTIVE SEED AND SENTINEL LYMPH NODE BIOPSY Left 07/15/2020   Procedure: LEFT BREAST LUMPECTOMY WITH RADIOACTIVE SEED AND LEFT AXILLARY SENTINEL LYMPH NODE BIOPSY, LEFT AXILLARY NODE RADIOACTIVE SEED GUIDED EXCISION, LEFT BREAST RADIOACTIVE SEED GUIDED EXCISION IM NODE;  Surgeon: Emelia Loron, MD;  Location: [facility] OR;  Service: General;  Laterality: Left;  PEC BLOCK   CESAREAN SECTION WITH BILATERAL TUBAL LIGATION Bilateral 07/23/2015   Procedure: CESAREAN SECTION WITH BILATERAL TUBAL LIGATION;  Surgeon: Reva Bores, MD;  Location: [facility];  Service: Obstetrics;  Laterality: Bilateral;   IR IMAGING GUIDED PORT INSERTION  02/11/2022   MODIFIED MASTECTOMY Left 08/22/2022   Procedure: LEFT MODIFIED RADICAL MASTECTOMY, LEFT AXILLARY NODE DISSECTION, AND SKIN BIOPSY OF BACK;  Surgeon: Emelia Loron, MD;  Location: [facility]  ORS;  Service: General;  Laterality: Left;   PORT-A-CATH REMOVAL N/A 04/30/2021   Procedure: REMOVAL PORT-A-CATH;  Surgeon: Emelia Loron, MD;  Location: [facility];  Service: General;  Laterality: N/A;   PORTACATH PLACEMENT Right 02/11/2020   Procedure: INSERTION PORT-A-CATH WITH ULTRASOUND GUIDANCE;  Surgeon: Emelia Loron, MD;  Location: [facility];  Service: General;  Laterality: Right;   TUBAL LIGATION     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 04/01/2022   Encounter for dental examination 03/11/2022   Acute apical periodontitis 03/11/2022   Phobia of dental procedure 03/11/2022   Defective dental restoration 03/11/2022   Accretions on teeth 03/11/2022   Chronic periodontitis 03/11/2022   Teeth missing 03/11/2022   Caries 03/11/2022   Generalized gingival recession 03/11/2022   Pain, dental 03/08/2022   Cancer-related pain 03/08/2022   Port-A-Cath in place 02/15/2022   Oral allergy syndrome, subsequent encounter 02/03/2022   Adverse effect of other drugs, medicaments and biological substances, subsequent encounter 02/02/2022   Other adverse food reactions, not elsewhere classified,  subsequent encounter 02/02/2022   Chest tightness 02/02/2022   Other allergic rhinitis 02/02/2022   Allergic conjunctivitis of both eyes 02/02/2022   Language barrier 12/11/2020   Morbid obesity    History of breast cancer    Genetic testing 02/13/2020   Malignant neoplasm of upper-outer quadrant of left breast in female, estrogen receptor positive 01/27/2020   Impaired ability to use community resources due to language barrier 07/13/2013    PCP: Angus Seller, NP  REFERRING PROVIDER: Rachel Moulds, MD  REFERRING DIAG:  Left UE lymphedema  THERAPY DIAG:  Postmastectomy lymphedema  Stiffness of left shoulder, not elsewhere classified  Aftercare following surgery for neoplasm  Abnormal posture  ONSET DATE: 08/01/22  Rationale for Evaluation and Treatment: Rehabilitation  SUBJECTIVE:                                                                                                                                                                                           SUBJECTIVE STATEMENT:  I got the compression sleeve and glove last night.   PERTINENT HISTORY:  Patient was diagnosed on 01/23/2020 with left grade III invasive ductal carcinoma breast cancer. It is ER positive, PR negative, and HER2 negative , but functionally Triple Negative,with a Ki67 of 40%. Patient with interpreter present. She underwent neoadjuvant chemotherapy from 02/12/2020 - 05/28/2020. 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 She underwent a left lumpectomy and sentinel node biopsy ( 4 negative nodes) on 07/15/2020. She underwent radiation and anti-estrogen therapy.  She had interval development of diffuse skin thickening in 2023. She had a punch biopsy of her skin at that time and this was consistent with inflammatory breast cancer. She has been treated with chemo since then. She had a left modified radical mastectomy on 08/22/2022 with complete therapeutic response and 0/1 LN. She noted some swelling in her left arm and on 08/25/2022 she had an [place] that was negative for DVT. Her SOZO screen on 09/19/2021 was 6.9 points above baseline putting her in the yellow zone and she was given a compression sleeve that was not containing  her so a flat knit sleeve and glove and nighttime garment  have been ordered   PAIN:  Are you having pain? Burning sensation in bilateral hands and feet since beginning the chemo pill   PRECAUTIONS: Other: lymphedema [name]  WEIGHT BEARING RESTRICTIONS: No  FALLS:  Has patient fallen in last 6  months? 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 Fullness in left lateral chest and back as well as left arm and hand especially on thumb side   OBSERVATIONS / OTHER ASSESSMENTS: scar well healed, increased edema at lateral trunk and throughout [name] reddened area in radiation field with skin appearing fragile and irritated but no open areas   POSTURE: forward head, rounded shoulders  UPPER EXTREMITY AROM/PROM:  A/PROM RIGHT   eval   Shoulder extension 68  Shoulder flexion 160  Shoulder abduction 175  Shoulder internal rotation 56  Shoulder external rotation 88    (Blank rows = not tested)  A/PROM LEFT   eval LEFT 11/10/22 LEFT 12/06/22 LEFT 12/08/2022 LEFT 12/19/2022  Shoulder extension 52      Shoulder flexion 146 150 156 155 165  Shoulder abduction 148 160 164 170 170  Shoulder internal rotation 67      Shoulder external rotation 86        (Blank rows = not tested)   LYMPHEDEMA ASSESSMENTS:  SURGERY TYPE/DATE: Left Lumpectomy with SLNB11/11/2019, Left modified mastectomy with left axillary node dissection NUMBER OF LYMPH NODES REMOVED: 0/4 with lumpectomy, 0/1 during Mastectomy CHEMOTHERAPY: yes RADIATION:Yes HORMONE TREATMENT: may require INFECTIONS: none  LYMPHEDEMA ASSESSMENTS:  LANDMARK RIGHT  eval RIGHT 11/08/22  10 cm proximal to  olecranon process 31.4 34.4  Olecranon process 27.2 28  10  cm proximal to ulnar styloid process 25.7 25.5  Just proximal to ulnar styloid process 18.4 19  Across hand at thumb web space 20 20  At base of 2nd digit 6.4 6.5  (Blank rows = not tested)  LANDMARK LEFT  eval LEFT 10/25/22 Left  11/03/22 LEFT 11/08/22 LEFT  12/08/2022   10 cm proximal to olecranon process 33 33 31.9 33 34.3  Olecranon process 28.4 28.5 28.5 29 28.7  10 cm proximal to ulnar styloid process 26 26.5 26.4 26.6 26.5  Just proximal to ulnar styloid process 19.4 19 18.9 19.1 18.2  Across hand at thumb web space 21 20.5 19.7 20.7 19.5  2nd digit    6.7 6.5   TODAY'S TREATMENT:   01/03/23: Demonstrated correct way to don/doff compression sleeve and glove. Had pt return demonstrate. Sleeve fit very well. Educated pt on care instructions and educated her to wear her sleeve during the day and apply compression bandages at night.   12/28/2022  MLD: Short neck, L 5 diaphragmatic breaths, bil axillary nodes and establishment of interaxillary pathway, Lt inguinal nodes, Lt axillo-inguinal anastomosis spending increased time in area of fullness inferior to axilla, L UE working proximal to distal, then retracing all steps.  Educated pt about availability of compression tank tops and educated her that this may help since her compression bra has not been helping lateral trunk edema and digs in right below her swelling Compression Bandaging: TG soft from hand to axilla,  mollelast to digits 1-4 with  , artiflex from hand to axilla, 1 6 cm bandage at hand, 1 8 cm bandage from wrist to axilla with x at antecubital fossa and 1 12 cm bandage from wrist  to axilla   12/21/2022: Rep from [employer] in clinic today. Arrangements were made for rep to come to our clinic at 11:00 on 01/03/2023 to do the demonstration for the [employer]ouch and fill out the needed paperwork to see if pt qualifies for a donated garment. Also checked on the sleeve as the  [employer] portal says that they are waiting for payment and pt given information to contact them about it.  Pt askes for MLD only as she cannot do that at home so it was performed as on 12/19/2022 with extra time and stretch to anterior chest and MLD at fullness at anterior and posterior axilla.    12/19/2022  MLD: Short neck, L 5 diaphragmatic breaths, bil axillary nodes and establishment of interaxillary pathway, Lt inguinal nodes, Lt axillo-inguinal anastomosis L UE working proximal to distal especially at thumb side of hand with arm elevated. then retracing all steps. Then to sidelying for more prolonged work on posterior interaxillary anastamosis with gentle pressure from one hand on lateral chest and axilla. In sidelying, small circles with hand pointed to ceiling, shoulder flexion and external rotation. X 10  Talked with pt about [employer] but she says she has not heard from them yet, but may have not picked up their call.  Gave pt T[employer] card and asked to call them to check on it herself ( with assist from English speaking family) Reinforced scar massage and skin stretch at anterior chest since she is healed from radiation   12/15/2022  MLD: Short neck, L 5 diaphragmatic breaths, bil axillary nodes and establishment of interaxillary pathway, Lt inguinal nodes, Lt axillo-inguinal anastomosis avoiding radiation field tenderness, L UE working proximal to distal, then retracing all steps. Then to sidelying for more prolonged work on Merchandiser, retail. supine dowel stretch in flexion and abduction In sidelying, small circles with hand pointed to ceiling,x 5  short range abduction abduction with straight arm, shoulder flexion and external rotation. X 10  Compression Bandaging: TG soft from hand to axilla,  mollelast to digits 1-5 with  , gray foam over dorsum of hand and wrist, artiflex from hand to axilla, 1 6 cm bandage at hand, 1 8 cm bandage from wrist to axilla with x at antecubital fossa  and 1 12 cm bandage from wrist to axilla   12/06/2022 MLD: Short neck, L 5 diaphragmatic breaths, R axillary nodes and establishment of interaxillary pathway, Lt inguinal nodes, Lt axillo-inguinal anastomosis avoiding radiation field, L UE working proximal to distal, then retracing all steps.  PROM to L shoulder gentle in to flexion and abduction making sure to avoid any pulling at fragile skin Compression Bandaging: TG soft from hand to axilla,  mollelast to digits 1-4 with  , artiflex from hand to axilla, 1 6 cm bandage at hand, 1 8 cm bandage from wrist to axilla in herringbone fashion and 1 12 cm bandage from wrist to axilla   12/01/2022 MLD: Short neck, L 5 diaphragmatic breaths, R axillary nodes and establishment of interaxillary pathway, Lt inguinal nodes, Lt axillo-inguinal anastomosis avoiding radiation field, L UE working proximal to distal, then to R s/l to work on L lateral trunk moving fluid towards pathways then back to supine retracing all steps.  PROM to L shoulder gentle in to flexion and abduction making sure to avoid any pulling at fragile skin  11/29/2022 MLD: Short neck, L 5 diaphragmatic breaths, R axillary nodes and establishment of interaxillary pathway, Lt inguinal nodes, Lt axillo-inguinal anastomosis avoiding radiation field, L  UE working proximal to distal, then to R s/l to work on L lateral trunk moving fluid towards pathways then back to supine retracing all steps.  PROM to L shoulder gentle in to flexion and abduction making sure to avoid any pulling at fragile skin Compression Bandaging: TG soft from hand to axilla,  mollelast to digits 1-5 with  , gray foam over dorsum of hand and wrist, artiflex from hand to axilla, 1 6 cm bandage at hand, 1 8 cm bandage from wrist to axilla with x at antecubital fossa and 1 12 cm bandage from wrist to axilla    11/24/2022 MLD: Short neck, Lt inguinal nodes, Lt axillo-inguinal anastomosis, avoiding radiation field, L UE working  proximal to distal then retracing all steps. Did not work on lateral trunk due to compromised skin from radiation Compression Bandaging: TG soft from hand to axilla,  mollelast to digits 1-5 with  , gray foam over dorsum of hand and wrist, artiflex from hand to axilla, 1 6 cm bandage at hand, 1 8 cm bandage from wrist to axilla with x at antecubital fossa and 1 12 cm bandage from wrist to axilla                                                                                                                                  PATIENT EDUCATION:  Education details:supine dowel flexion and abduction, Person educated: Patient Education method: Actor given  Education comprehension: verbalized understanding  HOME EXERCISE PROGRAM: Obtain compression bra, continue to wear sleeve and glove  ASSESSMENT:  CLINICAL IMPRESSION: Pt had the [employer] demonstration today. She felt less heaviness after the demonstration and had a 1 cm decrease in lymphedema at her chest. Pt received her custom sleeve and glove and was educated on proper donning/doffing. It fit well. Will assess how she was able to manage her lymphedema with it at next session. She is still awaiting the night time garment.   OBJECTIVE IMPAIRMENTS: decreased knowledge of condition, decreased ROM, decreased strength, increased edema, increased fascial restrictions, impaired UE functional use, postural dysfunction, and pain.   ACTIVITY LIMITATIONS: carrying, lifting, and reach over head  PARTICIPATION LIMITATIONS: cleaning, laundry, community activity, occupation, and yard work  PERSONAL FACTORS: Time since onset of injury/illness/exacerbation are also affecting patient's functional outcome.   REHAB POTENTIAL: Good  CLINICAL DECISION MAKING: Stable/uncomplicated  EVALUATION COMPLEXITY: Low  GOALS: Goals reviewed with patient? Yes   SHORT TERM GOALS = LONG TERM GOALS: Target date: 11/09/22  Pt will be independent in self  MLD for long term management of lymphedema.  Baseline:  Goal status: IN PROGRESS   2.  Pt will obtain appropriate compression garments for long term management of lymphedema.  Baseline:  Goal status: IN PROGRESS  3.  Pt will demonstrate 160 degrees of L shoulder flexion to allow her to reach overhead. Baseline:  Goal status: IN PROGRESS 11/09/2022:  155  4. Pt will  demonstrate 170 degrees of L shoulder abduction to allow her to reach out to the side.   Goal: IN PROGRESS 12/08/22 - 164  5. Pt will be independent in a home exercise program for continued stretching and strengthening.  Goal: IN PROGRESS   6. Pt will report she is able to raise her L arm to end range wihtout increased pain in her axilla to allow improved comfort.   GOAL: IN PROGRESS - pt still having pain   PLAN:  PT FREQUENCY:1- 2x/week  PT DURATION: 8 weeks   PLANNED INTERVENTIONS: Therapeutic exercises, Therapeutic activity, Patient/Family education, Self Care, Joint mobilization, Manual lymph drainage, Compression bandaging, scar mobilization, Taping, Vasopneumatic device, and Manual therapy  PLAN FOR NEXT SESSION how was sleeve?, cont MLD to L UE and lateral trunk and, progress exercises to include stretching strengthening , make sure garments fit and pt is able to manage her self care at home prior to discharge      Leonette Most, PT 01/03/2023, 2:01 PM [facility] [address] [place], [place], [address] Phone: [phone]   Fax:  [phone]

## 2023-01-05 ENCOUNTER — Ambulatory Visit: Payer: Self-pay | Admitting: Radiation Oncology

## 2023-01-05 ENCOUNTER — Telehealth: Payer: Self-pay

## 2023-01-05 NOTE — Telephone Encounter (Signed)
RN attempted to call pt for telephone follow up without success. Voicemail was left through telephone translation line.

## 2023-01-10 ENCOUNTER — Inpatient Hospital Stay: Payer: No Typology Code available for payment source

## 2023-01-10 ENCOUNTER — Other Ambulatory Visit: Payer: Self-pay

## 2023-01-10 ENCOUNTER — Other Ambulatory Visit (HOSPITAL_COMMUNITY): Payer: Self-pay

## 2023-01-10 ENCOUNTER — Inpatient Hospital Stay (HOSPITAL_BASED_OUTPATIENT_CLINIC_OR_DEPARTMENT_OTHER): Payer: No Typology Code available for payment source | Admitting: Hematology and Oncology

## 2023-01-10 VITALS — BP 118/69 | HR 100 | Temp 97.5°F | Resp 13 | Wt 194.7 lb

## 2023-01-10 DIAGNOSIS — Z95828 Presence of other vascular implants and grafts: Secondary | ICD-10-CM

## 2023-01-10 DIAGNOSIS — C50412 Malignant neoplasm of upper-outer quadrant of left female breast: Secondary | ICD-10-CM

## 2023-01-10 DIAGNOSIS — L271 Localized skin eruption due to drugs and medicaments taken internally: Secondary | ICD-10-CM

## 2023-01-10 DIAGNOSIS — Z170 Estrogen receptor positive status [ER+]: Secondary | ICD-10-CM

## 2023-01-10 LAB — CBC WITH DIFFERENTIAL/PLATELET
Abs Immature Granulocytes: 0.02 10*3/uL (ref 0.00–0.07)
Basophils Absolute: 0 10*3/uL (ref 0.0–0.1)
Basophils Relative: 0 %
Eosinophils Absolute: 0.1 10*3/uL (ref 0.0–0.5)
Eosinophils Relative: 3 %
HCT: 34.7 % — ABNORMAL LOW (ref 36.0–46.0)
Hemoglobin: 11.9 g/dL — ABNORMAL LOW (ref 12.0–15.0)
Immature Granulocytes: 1 %
Lymphocytes Relative: 25 %
Lymphs Abs: 0.9 10*3/uL (ref 0.7–4.0)
MCH: 28.5 pg (ref 26.0–34.0)
MCHC: 34.3 g/dL (ref 30.0–36.0)
MCV: 83 fL (ref 80.0–100.0)
Monocytes Absolute: 0.3 10*3/uL (ref 0.1–1.0)
Monocytes Relative: 8 %
Neutro Abs: 2.3 10*3/uL (ref 1.7–7.7)
Neutrophils Relative %: 63 %
Platelets: 161 10*3/uL (ref 150–400)
RBC: 4.18 MIL/uL (ref 3.87–5.11)
RDW: 16.3 % — ABNORMAL HIGH (ref 11.5–15.5)
WBC: 3.6 10*3/uL — ABNORMAL LOW (ref 4.0–10.5)
nRBC: 0 % (ref 0.0–0.2)

## 2023-01-10 LAB — COMPREHENSIVE METABOLIC PANEL
ALT: 12 U/L (ref 0–44)
AST: 18 U/L (ref 15–41)
Albumin: 4.2 g/dL (ref 3.5–5.0)
Alkaline Phosphatase: 91 U/L (ref 38–126)
Anion gap: 5 (ref 5–15)
BUN: 11 mg/dL (ref 6–20)
CO2: 29 mmol/L (ref 22–32)
Calcium: 9.6 mg/dL (ref 8.9–10.3)
Chloride: 103 mmol/L (ref 98–111)
Creatinine, Ser: 0.73 mg/dL (ref 0.44–1.00)
GFR, Estimated: >60 mL/min (ref >60)
Glucose, Bld: 129 mg/dL — ABNORMAL HIGH (ref 70–99)
Potassium: 3.7 mmol/L (ref 3.5–5.1)
Sodium: 137 mmol/L (ref 135–145)
Total Bilirubin: 0.7 mg/dL (ref 0.3–1.2)
Total Protein: 6.8 g/dL (ref 6.5–8.1)

## 2023-01-10 MED ORDER — HEPARIN SOD (PORK) LOCK FLUSH 100 UNIT/ML IV SOLN
500.0000 [IU] | Freq: Once | INTRAVENOUS | Status: AC
  Administered 2023-01-10: 500 [IU]

## 2023-01-10 MED ORDER — SODIUM CHLORIDE 0.9% FLUSH
10.0000 mL | Freq: Once | INTRAVENOUS | Status: AC
  Administered 2023-01-10: 10 mL

## 2023-01-10 MED ORDER — OMEPRAZOLE 40 MG PO CPDR: 40.0000 mg | DELAYED_RELEASE_CAPSULE | Freq: Every day | ORAL | 1 refills | Status: DC

## 2023-01-10 NOTE — Progress Notes (Signed)
[facility] Follow up:    Monica Foy, NP [address] [place] [address]   DIAGNOSIS:  Cancer Staging  Malignant neoplasm of upper-outer quadrant of left breast in female, estrogen receptor positive ([place]) Staging form: Breast, AJCC 8th Edition - Clinical stage from 01/29/2020: Stage IIIA (cT2, cN1, cM0, G3, ER+, PR-, HER2-) - Signed by Monica Pike, MD on 06/28/2022 Stage prefix: Initial diagnosis Histologic grading system: 3 grade system - Pathologic stage from 07/15/2020: No Stage Recommended (ypT1b, pN0, cM0, G3, ER-, PR-, HER2-) - Signed by Monica Phlegm, NP on 04/01/2022 Stage prefix: Post-therapy Histologic grading system: 3 grade system   SUMMARY OF ONCOLOGIC HISTORY: Oncology History  Malignant neoplasm of upper-outer quadrant of left breast in female, estrogen receptor positive ([place])  01/23/2020 Initial Diagnosis   [place] woman status post left breast upper outer quadrant biopsy 01/23/2020 for a clinical T3 N1, 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             (a) chest CT scan and bone scan 02/07/2020 showed no evidence of metastatic disease   01/29/2020 Cancer Staging   Staging form: Breast, AJCC 8th Edition - Clinical stage from 01/29/2020: Stage IIIA (cT2, cN1, cM0, G3, ER+, PR-, HER2-) - Signed by Monica Pike, MD on 06/28/2022 Stage prefix: Initial diagnosis Histologic grading system: 3 grade system   02/07/2020 Genetic Testing   Negative genetic testing. No pathogenic variants identified on the [facility] Common Hereditary Cancers Panel. VUS in MSH6 called c.2744C>G identified. The report date is 02/07/2020.  The Common Hereditary Cancers Panel offered by [facility] includes sequencing and/or deletion duplication testing of the following 48 genes: APC, ATM, AXIN2, BARD1, BMPR1A, BRCA1, BRCA2, BRIP1, CDH1, CDKN2A (p14ARF), CDKN2A (p16INK4a), CKD4, CHEK2, CTNNA1,  DICER1, EPCAM (Deletion/duplication testing only), GREM1 (promoter region deletion/duplication testing only), KIT, MEN1, MLH1, MSH2, MSH3, MSH6, MUTYH, NBN, NF1, NHTL1, PALB2, PDGFRA, PMS2, POLD1, POLE, PTEN, RAD50, RAD51C, RAD51D, RNF43, SDHB, SDHC, SDHD, SMAD4, SMARCA4. STK11, TP53, TSC1, TSC2, and VHL.  The following genes were evaluated for sequence changes only: SDHA and HOXB13 c.251G>A variant only.   02/12/2020 - 05/28/2020 Neo-Adjuvant Chemotherapy   neoadjuvant chemotherapy consisting of doxorubicin and cyclophosphamide in dose dense fashion x4 started 02/12/2020, completed 03/25/2020, followed by paclitaxel and carboplatin weekly x12 started 04/08/2020 and completed on 05/28/2020             (a) echo 02/07/2020 shows an ejection fraction in the 55-60% range             (b) Epirubicin substituted for Doxorubicin starting with cycle 2 of AC secondary to allergic rash from Doxorubicin   07/15/2020 Surgery   status post left lumpectomy and sentinel lymph node sampling 07/15/2020 for a ypT1b ypN0 residual invasive ductal carcinoma, grade 3, with negative margins.             (a) a total of 4 left axillary lymph nodes were removed             (b) repeat prognostic panel on the final pathology again triple negative, with an MIB-1 of 50%.   07/15/2020 Cancer Staging   Staging form: Breast, AJCC 8th Edition - Pathologic stage from 07/15/2020: No Stage Recommended (ypT1b, pN0, cM0, G3, ER-, PR-, HER2-) - Signed by Monica Phlegm, NP on 04/01/2022 Stage prefix: Post-therapy Histologic grading system: 3 grade system   08/26/2020 - 10/09/2020 Radiation Therapy   adjuvant radiation  completed 10/09/2020             (a) sensitizing capecitabine attempted but not tolerated by the patient  08/26/2020 through 10/09/2020 Site Technique Total Dose (Gy) Dose per Fx (Gy) Completed Fx Beam Energies  Breast, Left: Breast_Lt 3D 50/50 2 25/25 15X  Breast, Left: Breast_Lt_PAB_SCV 3D 50/50 2 25/25 10X, 15X   Breast, Left: Breast_Lt_Bst 3D 10/10 2 5/5 6X, 10X    11/23/2020 - 01/04/2021 Adjuvant Chemotherapy   adjuvant pembrolizumab started 11/23/2020, to continue for 1 year             (a) discontinued after 01/04/2021 dose with multiple side effects requiring steroids for resolution   03/30/2022 Mammogram   Mammogram and ultrasound show numerous masses throughout left breast not visualized in 01/2022, +left axillary lymphadenopathy, skin thickening in right breast and in upper abdomen just below sternum   04/05/2022 - 07/20/2022 Chemotherapy   Patient is on Treatment Plan : BREAST METASTATIC Fam-Trastuzumab Deruxtecan-nxki (Enhertu) (5.4) q21d     04/06/2022 - 04/26/2022 Chemotherapy   Patient is on Treatment Plan : BREAST METASTATIC fam-trastuzumab deruxtecan-nxki (Enhertu) q21d     11/25/2022 -  Chemotherapy   Patient is on Treatment Plan : BREAST Capecitabine q21d     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  Prognostic panel sent to [facility]: Left breast skin, punch biopsy: Skin with dermal lymphatic involvement by carcinoma (confirmed with outside CK7 IHC, reviewed). Biomarkers are reported as negative for ER, PR, Her2/neu (confirmed on review but please note sample size is extremely small).   02/15/2022 - 03/08/2022 Chemotherapy    Taxotere/Cytoxan x 2, discontinued due to progression of breast.   03/30/2022 Mammogram   Mammogram and ultrasound show numerous masses throughout left breast not visualized in 01/2022, +left axillary lymphadenopathy, skin thickening in right breast and in upper abdomen just below sternum   04/05/2022 - 07/20/2022 Chemotherapy   Patient is on Treatment Plan : BREAST METASTATIC Fam-Trastuzumab Deruxtecan-nxki (Enhertu) (5.4) q21d     04/06/2022 - 04/26/2022 Chemotherapy   Patient is on Treatment Plan : BREAST METASTATIC fam-trastuzumab deruxtecan-nxki (Enhertu) q21d     11/25/2022  -  Chemotherapy   Patient is on Treatment Plan : BREAST Capecitabine q21d      INTERVAL HISTORY:  [name] 47 y.o. female returns for follow-up of her breast cancer.   She is now post surgery, had left mastectomy, lymph axillary excision.  Her most recent surgery did not show any further evidence of disease after Enhertu.   However given her very high risk of recurrence, we have discussed about considering adjuvant treatment with Xeloda. She is now on xeloda.  Since last visit, she has been feeling noticing increased burning, itching of the eyes, discharge, her eyes get sticky in the morning.  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) 04/01/2022   Encounter for dental examination 03/11/2022   Acute apical periodontitis 03/11/2022   Phobia of dental procedure 03/11/2022   Defective dental restoration 03/11/2022   Accretions on teeth 03/11/2022   Chronic periodontitis 03/11/2022   Teeth missing 03/11/2022   Caries 03/11/2022   Generalized gingival recession 03/11/2022   Pain, dental 03/08/2022  Cancer-related pain 03/08/2022   Port-A-Cath in place 02/15/2022   Oral allergy syndrome, subsequent encounter 02/03/2022   Adverse effect of other drugs, medicaments and biological substances, subsequent encounter 02/02/2022   Other adverse food reactions, not elsewhere classified, subsequent encounter 02/02/2022   Chest tightness 02/02/2022   Other allergic rhinitis 02/02/2022   Allergic conjunctivitis of both eyes 02/02/2022   Language barrier 12/11/2020   Morbid obesity (HCC)    History of breast cancer    Genetic testing 02/13/2020   Malignant neoplasm of upper-outer quadrant of left breast in female, estrogen receptor positive (HCC)  01/27/2020   Impaired ability to use community resources due to language barrier 07/13/2013    is allergic to omnipaque [iohexol], doxorubicin hcl, keytruda [pembrolizumab], other, and tape.  MEDICAL HISTORY: Past Medical History:  Diagnosis Date   Allergy    Asthma    Breast cancer in female [facility])    Left   Breast disorder    breast cancer May 2021   Chronic back pain    Headache    High triglycerides    History of breast cancer    History of COVID-19 04/20/2021   Hx of migraines    Morbid obesity (HCC)    Personal history of chemotherapy    Personal history of radiation therapy     SURGICAL HISTORY: Past Surgical History:  Procedure Laterality Date   BREAST LUMPECTOMY     BREAST LUMPECTOMY WITH RADIOACTIVE SEED AND SENTINEL LYMPH NODE BIOPSY Left 07/15/2020   Procedure: LEFT BREAST LUMPECTOMY WITH RADIOACTIVE SEED AND LEFT AXILLARY SENTINEL LYMPH NODE BIOPSY, LEFT AXILLARY NODE RADIOACTIVE SEED GUIDED EXCISION, LEFT BREAST RADIOACTIVE SEED GUIDED EXCISION IM NODE;  Surgeon: Emelia Loron, MD;  Location: [facility] OR;  Service: General;  Laterality: Left;  PEC BLOCK   CESAREAN SECTION WITH BILATERAL TUBAL LIGATION Bilateral 07/23/2015   Procedure: CESAREAN SECTION WITH BILATERAL TUBAL LIGATION;  Surgeon: Reva Bores, MD;  Location: [facility];  Service: Obstetrics;  Laterality: Bilateral;   IR IMAGING GUIDED PORT INSERTION  02/11/2022   MODIFIED MASTECTOMY Left 08/22/2022   Procedure: LEFT MODIFIED RADICAL MASTECTOMY, LEFT AXILLARY NODE DISSECTION, AND SKIN BIOPSY OF BACK;  Surgeon: Emelia Loron, MD;  Location: [facility];  Service: General;  Laterality: Left;   PORT-A-CATH REMOVAL N/A 04/30/2021   Procedure: REMOVAL PORT-A-CATH;  Surgeon: Emelia Loron, MD;  Location: [facility];  Service: General;  Laterality: N/A;   PORTACATH PLACEMENT Right 02/11/2020   Procedure: INSERTION PORT-A-CATH WITH ULTRASOUND GUIDANCE;  Surgeon: Emelia Loron, MD;  Location:  [facility];  Service: General;  Laterality: Right;   TUBAL LIGATION      SOCIAL HISTORY: Social History   Socioeconomic History   Marital status: Legally Separated    Spouse name: Not on file   Number of children: 3   Years of education: Not on file   Highest education level: 9th grade  Occupational History   Occupation: Holiday representative  Tobacco Use   Smoking status: Never   Smokeless tobacco: Never  Vaping Use   Vaping Use: Never used  Substance and Sexual Activity   Alcohol use: No   Drug use: Never   Sexual activity: Yes    Birth control/protection: Surgical  Other Topics Concern   Not on file  Social History Narrative   Not on file   Social Determinants of Health   Financial Resource Strain: Not on file  Food Insecurity: No Food Insecurity (10/11/2022)   Hunger Vital Sign  Worried About Programme researcher, broadcasting/film/video in the Last Year: Never true    Ran Out of Food in the Last Year: Never true  Transportation Needs: No Transportation Needs (10/11/2022)   PRAPARE - Administrator, Civil Service (Medical): No    Lack of Transportation (Non-Medical): No  Physical Activity: Not on file  Stress: Not on file  Social Connections: Not on file  Intimate Partner Violence: Not At Risk (10/11/2022)   Humiliation, Afraid, Rape, and Kick questionnaire    Fear of Current or Ex-Partner: No    Emotionally Abused: No    Physically Abused: No    Sexually Abused: No    FAMILY HISTORY: Family History  Problem Relation Age of Onset   Heart disease Maternal Grandmother     Review of Systems  Constitutional:  Positive for fatigue. Negative for appetite change, chills, fever and unexpected weight change.  HENT:   Negative for hearing loss, lump/mass and trouble swallowing.   Eyes:  Positive for eye problems. Negative for icterus.  Respiratory:  Negative for chest tightness, cough and shortness of breath.   Cardiovascular:  Negative for chest pain, leg swelling and  palpitations.  Gastrointestinal:  Negative for abdominal distention, abdominal pain, constipation, diarrhea, nausea and vomiting.  Endocrine: Negative for hot flashes.  Genitourinary:  Negative for difficulty urinating.   Musculoskeletal:  Positive for arthralgias.  Skin:  Positive for rash. Negative for itching.  Neurological:  Negative for dizziness, extremity weakness, headaches and numbness.  Hematological:  Negative for adenopathy. Does not bruise/bleed easily.  Psychiatric/Behavioral:  Negative for depression. The patient is not nervous/anxious.       PHYSICAL EXAMINATION  ECOG PERFORMANCE STATUS: 1 - Symptomatic but completely ambulatory  Vitals:   01/10/23 1241  BP: 118/69  Pulse: 100  Resp: 13  Temp: (!) 97.5 F (36.4 C)  SpO2: 96%     Physical Exam Constitutional:      Appearance: Normal appearance.  Chest:     Comments: Ongoing skin changes from radiation with some skin desquamation. No infection  Neurological:     Mental Status: She is alert.      LABORATORY DATA:  None today  RADIOGRAPHIC STUDIES:  No results found.    ASSESSMENT and THERAPY PLAN:    Malignant neoplasm of upper-outer quadrant of left breast in female, estrogen receptor positive (HCC) Chase is a 47 year old Spanish-speaking woman who has a 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  She was most recently seen for ongoing breast redness, given a course of antibiotics but had persistent redness hence we proceeded with a punch biopsy.  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  Pathology was sent to [facility] for second opinion as well. This confirmed triple negative breast cancer.   She progressed on after 2 cycles of TC hence we have switched her treatment to Enhertu.   Repeat biopsy demonstrated her HER2 to be 1+.   Given locally advanced and unresectable breast cancer  and the progression on TC and previous exposure to Adriamycin based regimen, we decided to try Enhertu for HER2 low disease and she had robust response.  She is now status post left mastectomy and axillary resection and there is no evidence of residual tumor, complete pathologic response obtained.  Since this is a recurrent breast cancer and high risk for systemic disease, I have discussed about adjuvant Xeloda based on the create X  trial I will start her on adjuvant Xeloda after completion of radiation.   Once again we discussed the excellent response achieved so far, role of antiestrogen therapy like tamoxifen as well as adjuvant chemotherapy leg Xeloda. She completed adjuvant radiation.   She is now on adjuvant Xeloda at 1000 mg/m (1250 mg/m was original dose used and create X) twice daily 2 weeks on 1 week off for 6-8 cycles.  She is here for tox check on xeloda. She complains of fatigue, stomach pain, burning pain of soles from xeloda, eye burning.  She wants to cut down the dose to 1500 mg Q AM and Q PM. I think this is reasonable. She is not interested to seeing an ophthalmologist. She doesn't think its seasonal allergies. No concerns on exam. Labs today with no complaints. CBC and CMP today reviewed, mild leukopenia and anemia.  RTC in 3 weeks, All questions were answered. The patient knows to call the clinic with any problems, questions or concerns. We can certainly see the patient much sooner if necessary.  Total time spent: 30 minutes.  Certified Spanish interpreter was used for the entirety of this conversation  *Total Encounter Time as defined by the Centers for Medicare and Medicaid Services includes, in addition to the face-to-face time of a patient visit (documented in the note above) non-face-to-face time: obtaining and reviewing outside history, ordering and reviewing medications, tests or procedures, care coordination (communications with other health care professionals or  caregivers) and documentation in the medical record.

## 2023-01-11 ENCOUNTER — Encounter: Payer: Self-pay | Admitting: Hematology and Oncology

## 2023-01-11 ENCOUNTER — Other Ambulatory Visit: Payer: Self-pay

## 2023-01-11 ENCOUNTER — Other Ambulatory Visit (HOSPITAL_COMMUNITY): Payer: Self-pay

## 2023-01-11 ENCOUNTER — Telehealth: Payer: Self-pay | Admitting: Hematology and Oncology

## 2023-01-11 ENCOUNTER — Telehealth: Payer: Self-pay

## 2023-01-11 NOTE — Telephone Encounter (Signed)
Patient contacted [employer], Neysa Bonito, stated she needed an ultrasound. I verified with Vikki Ports, RN (Dr. Remonia Richter RN) that no imaging is needed at this time, if needed, will check with Dr. Al Pimple, have patient contact her if needed. Via Delorise Royals, Spanish Interpreter [facility]), Patient informed.

## 2023-01-11 NOTE — Telephone Encounter (Signed)
Spoke with patient confirming upcoming appointment  

## 2023-01-12 ENCOUNTER — Other Ambulatory Visit: Payer: Self-pay

## 2023-01-13 ENCOUNTER — Other Ambulatory Visit: Payer: Self-pay | Admitting: *Deleted

## 2023-01-13 ENCOUNTER — Ambulatory Visit: Payer: No Typology Code available for payment source | Attending: Hematology and Oncology | Admitting: Physical Therapy

## 2023-01-13 ENCOUNTER — Encounter: Payer: Self-pay | Admitting: Physical Therapy

## 2023-01-13 ENCOUNTER — Encounter: Payer: Self-pay | Admitting: Hematology and Oncology

## 2023-01-13 ENCOUNTER — Other Ambulatory Visit (HOSPITAL_COMMUNITY): Payer: Self-pay

## 2023-01-13 ENCOUNTER — Other Ambulatory Visit: Payer: Self-pay

## 2023-01-13 DIAGNOSIS — Z170 Estrogen receptor positive status [ER+]: Secondary | ICD-10-CM

## 2023-01-13 DIAGNOSIS — Z483 Aftercare following surgery for neoplasm: Secondary | ICD-10-CM | POA: Insufficient documentation

## 2023-01-13 DIAGNOSIS — R293 Abnormal posture: Secondary | ICD-10-CM | POA: Insufficient documentation

## 2023-01-13 DIAGNOSIS — M6281 Muscle weakness (generalized): Secondary | ICD-10-CM | POA: Insufficient documentation

## 2023-01-13 DIAGNOSIS — M25612 Stiffness of left shoulder, not elsewhere classified: Secondary | ICD-10-CM | POA: Insufficient documentation

## 2023-01-13 DIAGNOSIS — I972 Postmastectomy lymphedema syndrome: Secondary | ICD-10-CM | POA: Insufficient documentation

## 2023-01-13 DIAGNOSIS — C50912 Malignant neoplasm of unspecified site of left female breast: Secondary | ICD-10-CM

## 2023-01-13 MED ORDER — CAPECITABINE 500 MG PO TABS
ORAL_TABLET | ORAL | 5 refills | Status: DC
Start: 2023-01-13 — End: 2023-07-13
  Filled 2023-01-13: qty 84, 21d supply, fill #0
  Filled 2023-02-07: qty 84, 21d supply, fill #1
  Filled 2023-02-27: qty 84, 21d supply, fill #2
  Filled 2023-03-14: qty 84, 21d supply, fill #3
  Filled 2023-04-06: qty 84, 21d supply, fill #4

## 2023-01-13 NOTE — Therapy (Signed)
OUTPATIENT PHYSICAL THERAPY ONCOLOGY TREATMENT  Patient Name: [name] MRN: [id number] DOB:1975/11/07, 47 y.o., female Today's Date: 01/13/2023  END OF SESSION:  PT End of Session - 01/13/23 1009     Visit Number 24    Number of Visits 35    Date for PT Re-Evaluation 02/07/23    Authorization Type CAFA expires 7/28    PT Start Time 0905    PT Stop Time 1007    PT Time Calculation (min) 62 min    Activity Tolerance Patient tolerated treatment well    Behavior During Therapy [facility] for tasks assessed/performed                       Past Medical History:  Diagnosis Date   Allergy    Asthma    Breast cancer in female [facility])    Left   Breast disorder    breast cancer May 2021   Chronic back pain    Headache    High triglycerides    History of breast cancer    History of COVID-19 04/20/2021   Hx of migraines    Morbid obesity (HCC)    Personal history of chemotherapy    Personal history of radiation therapy    Past Surgical History:  Procedure Laterality Date   BREAST LUMPECTOMY     BREAST LUMPECTOMY WITH RADIOACTIVE SEED AND SENTINEL LYMPH NODE BIOPSY Left 07/15/2020   Procedure: LEFT BREAST LUMPECTOMY WITH RADIOACTIVE SEED AND LEFT AXILLARY SENTINEL LYMPH NODE BIOPSY, LEFT AXILLARY NODE RADIOACTIVE SEED GUIDED EXCISION, LEFT BREAST RADIOACTIVE SEED GUIDED EXCISION IM NODE;  Surgeon: Emelia Loron, MD;  Location: [facility];  Service: General;  Laterality: Left;  PEC BLOCK   CESAREAN SECTION WITH BILATERAL TUBAL LIGATION Bilateral 07/23/2015   Procedure: CESAREAN SECTION WITH BILATERAL TUBAL LIGATION;  Surgeon: Reva Bores, MD;  Location: [facility];  Service: Obstetrics;  Laterality: Bilateral;   IR IMAGING GUIDED PORT INSERTION  02/11/2022   MODIFIED MASTECTOMY Left 08/22/2022   Procedure: LEFT MODIFIED RADICAL MASTECTOMY, LEFT AXILLARY NODE DISSECTION, AND SKIN BIOPSY OF BACK;  Surgeon: Emelia Loron, MD;  Location: [facility];  Service: General;   Laterality: Left;   PORT-A-CATH REMOVAL N/A 04/30/2021   Procedure: REMOVAL PORT-A-CATH;  Surgeon: Emelia Loron, MD;  Location: [facility];  Service: General;  Laterality: N/A;   PORTACATH PLACEMENT Right 02/11/2020   Procedure: INSERTION PORT-A-CATH WITH ULTRASOUND GUIDANCE;  Surgeon: Emelia Loron, MD;  Location: [facility];  Service: General;  Laterality: Right;   TUBAL LIGATION     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) 04/01/2022   Encounter for dental examination 03/11/2022   Acute apical periodontitis 03/11/2022   Phobia of dental procedure 03/11/2022   Defective dental restoration 03/11/2022   Accretions on teeth 03/11/2022   Chronic periodontitis 03/11/2022   Teeth missing 03/11/2022   Caries 03/11/2022   Generalized gingival recession 03/11/2022   Pain, dental 03/08/2022   Cancer-related pain 03/08/2022   Port-A-Cath in place 02/15/2022   Oral allergy syndrome, subsequent encounter 02/03/2022   Adverse effect of other drugs, medicaments and biological substances, subsequent encounter 02/02/2022   Other adverse food reactions, not elsewhere classified, subsequent encounter 02/02/2022  Chest tightness 02/02/2022   Other allergic rhinitis 02/02/2022   Allergic conjunctivitis of both eyes 02/02/2022   Language barrier 12/11/2020   Morbid obesity (HCC)    History of breast cancer    Genetic testing 02/13/2020   Malignant neoplasm of upper-outer quadrant of left breast in female, estrogen receptor positive (HCC) 01/27/2020   Impaired ability to use community resources due to language barrier 07/13/2013    PCP: Angus Seller, NP  REFERRING PROVIDER: Rachel Moulds, MD  REFERRING DIAG: Left  UE lymphedema  THERAPY DIAG:  Postmastectomy lymphedema  Stiffness of left shoulder, not elsewhere classified  Aftercare following surgery for neoplasm  Abnormal posture  Muscle weakness (generalized)  ONSET DATE: 08/01/22  Rationale for Evaluation and Treatment: Rehabilitation  SUBJECTIVE:                                                                                                                                                                                           SUBJECTIVE STATEMENT:  I have been wearing the sleeve but my hand is still swollen. I have been wearing the night time garment. Can I still bandage my arm?  PERTINENT HISTORY:  Patient was diagnosed on 01/23/2020 with left grade III invasive ductal carcinoma breast cancer. It is ER positive, PR negative, and HER2 negative , but functionally Triple Negative,with a Ki67 of 40%. Patient with interpreter present. She underwent neoadjuvant chemotherapy from 02/12/2020 - 05/28/2020. 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 She underwent a left lumpectomy and sentinel node biopsy ( 4 negative nodes) on 07/15/2020. She underwent radiation and anti-estrogen therapy.  She had interval development of diffuse skin thickening in 2023. She had a punch biopsy of her skin at that time and this was consistent with inflammatory breast cancer. She has been treated with chemo since then. She had a left modified radical mastectomy on 08/22/2022 with complete therapeutic response and 0/1 LN. She noted some swelling in her left arm and on 08/25/2022 she had an [place] that was negative for DVT. Her SOZO screen on 09/19/2021 was 6.9 points above baseline putting her in the yellow zone and she was given a compression sleeve that was not containing  her so a flat knit sleeve and glove and nighttime garment  have been ordered   PAIN:  Are you having pain? Burning sensation in bilateral hands and feet since beginning the chemo pill    PRECAUTIONS: Other: lymphedema [name]  WEIGHT BEARING RESTRICTIONS: No  FALLS:  Has patient fallen in last 6 months? 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 Fullness in left lateral chest and back as well as left arm and hand especially on thumb side   OBSERVATIONS / OTHER ASSESSMENTS: scar well healed, increased edema at lateral trunk and throughout [name] reddened area in radiation field with skin appearing fragile and irritated but no open areas   POSTURE: forward head, rounded shoulders  UPPER EXTREMITY AROM/PROM:  A/PROM RIGHT   eval   Shoulder extension 68  Shoulder flexion 160  Shoulder abduction 175  Shoulder internal rotation 56  Shoulder external rotation 88    (Blank rows = not tested)  A/PROM LEFT   eval LEFT 11/10/22 LEFT 12/06/22 LEFT 12/08/2022 LEFT 12/19/2022  Shoulder extension 52      Shoulder flexion 146 150 156 155 165  Shoulder abduction 148 160 164 170 170  Shoulder internal rotation 67      Shoulder external rotation 86        (Blank rows = not tested)   LYMPHEDEMA ASSESSMENTS:  SURGERY TYPE/DATE: Left Lumpectomy with SLNB11/11/2019, Left modified mastectomy with left axillary node dissection NUMBER OF LYMPH NODES REMOVED: 0/4 with lumpectomy, 0/1 during Mastectomy CHEMOTHERAPY: yes RADIATION:Yes HORMONE TREATMENT: may  require INFECTIONS: none  LYMPHEDEMA ASSESSMENTS:  LANDMARK RIGHT  eval RIGHT 11/08/22  10 cm proximal to olecranon process 31.4 34.4  Olecranon process 27.2 28  10  cm proximal to ulnar styloid process 25.7 25.5  Just proximal to ulnar styloid process 18.4 19  Across hand at thumb web space 20 20  At base of 2nd digit 6.4 6.5  (Blank rows = not tested)  LANDMARK LEFT  eval LEFT 10/25/22 Left  11/03/22 LEFT 11/08/22 LEFT  12/08/2022   10 cm proximal to olecranon process 33 33 31.9 33 34.3  Olecranon process 28.4 28.5 28.5 29 28.7  10 cm proximal to ulnar styloid process 26 26.5 26.4 26.6 26.5  Just proximal to ulnar styloid process 19.4 19 18.9 19.1 18.2  Across hand at thumb web space 21 20.5 19.7 20.7 19.5  2nd digit    6.7 6.5   TODAY'S TREATMENT:   01/13/23: Discussed how current glove is not managing her swelling. Discussed her current schedule for wearing her night time and day time garment. When pt takes off her night time garment she does her morning routine prior to donning day time garment. Educated pt to immediately don day time garment. Pt reports it is difficult to get ready, get her kids ready and cook breakfast without soiling glove so issued her disposable gloves to don over her glove. Also, her current glove is too loose and there is fabric at back of glove that can be pinched. Emailed [employer] about a Therapist, music. Educated pt that she can still bandage if she feels her hand is swelling. Began AAROM exercises today: Pulleys x 2 min in direction of flexion and abduction with v/c to decrease scapular compensation Ball up wall x 10 reps in direction of flexion and abduction  with pt returning therapist demo - pt reported back pain with this so educated pt on importance of posture and need for core strength to protect back Pelvic tilts with max v/c and t/c initially and therapist demo with pt able to return demo by end of session - issued this as part of HEP  01/03/23: Demonstrated  correct way to don/doff compression sleeve and glove. Had pt return demonstrate. Sleeve fit very well. Educated pt on care instructions and educated her to wear her sleeve during the day and apply compression bandages at night.   12/28/2022  MLD: Short neck, L 5 diaphragmatic breaths, bil axillary nodes and establishment of interaxillary pathway, Lt inguinal nodes, Lt axillo-inguinal anastomosis spending increased time in area of fullness inferior to axilla, L UE working proximal to distal, then retracing all steps.  Educated pt about availability of compression tank tops and educated her that this may help since her compression bra has not been helping lateral trunk edema and digs in right below her swelling Compression Bandaging: TG soft from hand to axilla,  mollelast to digits 1-4 with  , artiflex from hand to axilla, 1 6 cm bandage at hand, 1 8 cm bandage from wrist to axilla with x at antecubital fossa and 1 12 cm bandage from wrist to axilla   12/21/2022: Rep from [employer] in clinic today. Arrangements were made for rep to come to our clinic at 11:00 on 01/03/2023 to do the demonstration for the [employer] and fill out the needed paperwork to see if pt qualifies for a donated garment. Also checked on the sleeve as the [employer] portal says that they are waiting for payment and pt given information to contact them about it.  Pt askes for MLD only as she cannot do that at home so it was performed as on 12/19/2022 with extra time and stretch to anterior chest and MLD at fullness at anterior and posterior axilla.    12/19/2022  MLD: Short neck, L 5 diaphragmatic breaths, bil axillary nodes and establishment of interaxillary pathway, Lt inguinal nodes, Lt axillo-inguinal anastomosis L UE working proximal to distal especially at thumb side of hand with arm elevated. then retracing all steps. Then to sidelying for more prolonged work on posterior interaxillary anastamosis with gentle pressure from one hand on  lateral chest and axilla. In sidelying, small circles with hand pointed to ceiling, shoulder flexion and external rotation. X 10  Talked with pt about [employer] but she says she has not heard from them yet, but may have not picked up their call.  Gave pt [employer] card and asked to call them to check on it herself ( with assist from English speaking family) Reinforced scar massage and skin stretch at anterior chest since she is healed from radiation   12/15/2022  MLD: Short neck, L 5 diaphragmatic breaths, bil axillary nodes and establishment of interaxillary pathway, Lt inguinal nodes, Lt axillo-inguinal anastomosis avoiding radiation field tenderness, L UE working proximal to distal, then retracing all steps. Then to sidelying for more prolonged work on Merchandiser, retail. supine dowel stretch in flexion and abduction In sidelying, small circles with hand pointed to ceiling,x 5  short range abduction abduction with straight arm, shoulder flexion and external rotation. X 10  Compression Bandaging: TG soft from hand to axilla,  mollelast to digits 1-5 with  , gray foam over dorsum of hand and wrist, artiflex from hand to axilla, 1 6 cm bandage at hand, 1 8 cm bandage from wrist  to axilla with x at antecubital fossa and 1 12 cm bandage from wrist to axilla   12/06/2022 MLD: Short neck, L 5 diaphragmatic breaths, R axillary nodes and establishment of interaxillary pathway, Lt inguinal nodes, Lt axillo-inguinal anastomosis avoiding radiation field, L UE working proximal to distal, then retracing all steps.  PROM to L shoulder gentle in to flexion and abduction making sure to avoid any pulling at fragile skin Compression Bandaging: TG soft from hand to axilla,  mollelast to digits 1-4 with  , artiflex from hand to axilla, 1 6 cm bandage at hand, 1 8 cm bandage from wrist to axilla in herringbone fashion and 1 12 cm bandage from wrist to axilla   12/01/2022 MLD: Short neck, L 5 diaphragmatic  breaths, R axillary nodes and establishment of interaxillary pathway, Lt inguinal nodes, Lt axillo-inguinal anastomosis avoiding radiation field, L UE working proximal to distal, then to R s/l to work on L lateral trunk moving fluid towards pathways then back to supine retracing all steps.  PROM to L shoulder gentle in to flexion and abduction making sure to avoid any pulling at fragile skin  11/29/2022 MLD: Short neck, L 5 diaphragmatic breaths, R axillary nodes and establishment of interaxillary pathway, Lt inguinal nodes, Lt axillo-inguinal anastomosis avoiding radiation field, L UE working proximal to distal, then to R s/l to work on L lateral trunk moving fluid towards pathways then back to supine retracing all steps.  PROM to L shoulder gentle in to flexion and abduction making sure to avoid any pulling at fragile skin Compression Bandaging: TG soft from hand to axilla,  mollelast to digits 1-5 with  , gray foam over dorsum of hand and wrist, artiflex from hand to axilla, 1 6 cm bandage at hand, 1 8 cm bandage from wrist to axilla with x at antecubital fossa and 1 12 cm bandage from wrist to axilla    11/24/2022 MLD: Short neck, Lt inguinal nodes, Lt axillo-inguinal anastomosis, avoiding radiation field, L UE working proximal to distal then retracing all steps. Did not work on lateral trunk due to compromised skin from radiation Compression Bandaging: TG soft from hand to axilla,  mollelast to digits 1-5 with  , gray foam over dorsum of hand and wrist, artiflex from hand to axilla, 1 6 cm bandage at hand, 1 8 cm bandage from wrist to axilla with x at antecubital fossa and 1 12 cm bandage from wrist to axilla                                                                                                                                  PATIENT EDUCATION:  Education details:compression schedule to avoid hand swelling, how core strength affects back, pelvic tilts Person educated: Patient Education  method: Explanation handout given  Education comprehension: verbalized understanding  HOME EXERCISE PROGRAM: Obtain compression bra, continue to wear sleeve and glove Pelvic tilts x 10 reps each with  5 sec holds  ASSESSMENT:  CLINICAL IMPRESSION: Reached out to [facility] for a remake of her glove since her glove she just received is too loose at dorsum at hand. Pt reports tightness in L axilla with UE ROM. Began AAROM exercises and will continue to progress those to decrease tightness and add strengthening exercises. Educated pt about importance of 24/7 compression to help decrease hand swelling. Answered pt's questions regarding compression. Began instructing pt in pelvic tilts to improve posture since pt was having back pain with exercises due to increased lumbar lordosis and activating core.   OBJECTIVE IMPAIRMENTS: decreased knowledge of condition, decreased ROM, decreased strength, increased edema, increased fascial restrictions, impaired UE functional use, postural dysfunction, and pain.   ACTIVITY LIMITATIONS: carrying, lifting, and reach over head  PARTICIPATION LIMITATIONS: cleaning, laundry, community activity, occupation, and yard work  PERSONAL FACTORS: Time since onset of injury/illness/exacerbation are also affecting patient's functional outcome.   REHAB POTENTIAL: Good  CLINICAL DECISION MAKING: Stable/uncomplicated  EVALUATION COMPLEXITY: Low  GOALS: Goals reviewed with patient? Yes   SHORT TERM GOALS = LONG TERM GOALS: Target date: 11/09/22  Pt will be independent in self MLD for long term management of lymphedema.  Baseline:  Goal status: IN PROGRESS   2.  Pt will obtain appropriate compression garments for long term management of lymphedema.  Baseline:  Goal status: IN PROGRESS  3.  Pt will demonstrate 160 degrees of L shoulder flexion to allow her to reach overhead. Baseline:  Goal status: IN PROGRESS 11/09/2022:  155  4. Pt will demonstrate 170 degrees of L  shoulder abduction to allow her to reach out to the side.   Goal: IN PROGRESS 12/08/22 - 164  5. Pt will be independent in a home exercise program for continued stretching and strengthening.  Goal: IN PROGRESS   6. Pt will report she is able to raise her L arm to end range wihtout increased pain in her axilla to allow improved comfort.   GOAL: IN PROGRESS - pt still having pain   PLAN:  PT FREQUENCY:1- 2x/week  PT DURATION: 8 weeks   PLANNED INTERVENTIONS: Therapeutic exercises, Therapeutic activity, Patient/Family education, Self Care, Joint mobilization, Manual lymph drainage, Compression bandaging, scar mobilization, Taping, Vasopneumatic device, and Manual therapy  PLAN FOR NEXT SESSION how were pelvic tilts?, continue UE strengthening and strengthening,  cont MLD to L UE and lateral trunk and, progress exercises to include stretching strengthening , make sure garments fit and pt is able to manage her self care at home prior to discharge      Leonette Most, PT 01/13/2023, 10:42 AM [facility] [address] [place], [place], [address] Phone: [phone]   Fax:  [phone]

## 2023-01-16 ENCOUNTER — Ambulatory Visit: Payer: No Typology Code available for payment source | Admitting: Physical Therapy

## 2023-01-23 ENCOUNTER — Ambulatory Visit: Payer: No Typology Code available for payment source | Admitting: Physical Therapy

## 2023-01-23 ENCOUNTER — Other Ambulatory Visit: Payer: Self-pay | Admitting: *Deleted

## 2023-01-23 ENCOUNTER — Other Ambulatory Visit: Payer: Self-pay | Admitting: Hematology and Oncology

## 2023-01-23 ENCOUNTER — Encounter: Payer: Self-pay | Admitting: Physical Therapy

## 2023-01-23 DIAGNOSIS — R293 Abnormal posture: Secondary | ICD-10-CM

## 2023-01-23 DIAGNOSIS — M6281 Muscle weakness (generalized): Secondary | ICD-10-CM

## 2023-01-23 DIAGNOSIS — Z483 Aftercare following surgery for neoplasm: Secondary | ICD-10-CM

## 2023-01-23 DIAGNOSIS — M25612 Stiffness of left shoulder, not elsewhere classified: Secondary | ICD-10-CM

## 2023-01-23 DIAGNOSIS — I972 Postmastectomy lymphedema syndrome: Secondary | ICD-10-CM

## 2023-01-23 MED ORDER — CYCLOSPORINE 0.05 % OP EMUL: 1.0000 [drp] | Freq: Two times a day (BID) | OPHTHALMIC | 1 refills | Status: DC

## 2023-01-23 NOTE — Therapy (Signed)
OUTPATIENT PHYSICAL THERAPY ONCOLOGY TREATMENT  Patient Name: [name] MRN: [id number] DOB:[birth year], 47 y.o., female Today's Date: 01/23/2023  END OF SESSION:  PT End of Session - 01/23/23 1109     Visit Number 25    Number of Visits 35    Date for PT Re-Evaluation 02/07/23    Authorization Type CAFA expires 7/28    PT Start Time 1106    PT Stop Time 1202    PT Time Calculation (min) 56 min    Activity Tolerance Patient tolerated treatment well    Behavior During Therapy [facility] for tasks assessed/performed                       Past Medical History:  Diagnosis Date   Allergy    Asthma    Breast cancer in female [facility])    Left   Breast disorder    breast cancer May 2021   Chronic back pain    Headache    High triglycerides    History of breast cancer    History of COVID-19 04/20/2021   Hx of migraines    Morbid obesity (HCC)    Personal history of chemotherapy    Personal history of radiation therapy    Past Surgical History:  Procedure Laterality Date   BREAST LUMPECTOMY     BREAST LUMPECTOMY WITH RADIOACTIVE SEED AND SENTINEL LYMPH NODE BIOPSY Left 07/15/2020   Procedure: LEFT BREAST LUMPECTOMY WITH RADIOACTIVE SEED AND LEFT AXILLARY SENTINEL LYMPH NODE BIOPSY, LEFT AXILLARY NODE RADIOACTIVE SEED GUIDED EXCISION, LEFT BREAST RADIOACTIVE SEED GUIDED EXCISION IM NODE;  Surgeon: Emelia Loron, MD;  Location: [facility] OR;  Service: General;  Laterality: Left;  PEC BLOCK   CESAREAN SECTION WITH BILATERAL TUBAL LIGATION Bilateral 07/23/2015   Procedure: CESAREAN SECTION WITH BILATERAL TUBAL LIGATION;  Surgeon: Reva Bores, MD;  Location: [facility] ORS;  Service: Obstetrics;  Laterality: Bilateral;   IR IMAGING GUIDED PORT INSERTION  02/11/2022   MODIFIED MASTECTOMY Left 08/22/2022   Procedure: LEFT MODIFIED RADICAL MASTECTOMY, LEFT AXILLARY NODE DISSECTION, AND SKIN BIOPSY OF BACK;  Surgeon: Emelia Loron, MD;  Location: [facility] ORS;  Service: General;   Laterality: Left;   PORT-A-CATH REMOVAL N/A 04/30/2021   Procedure: REMOVAL PORT-A-CATH;  Surgeon: Emelia Loron, MD;  Location: [facility];  Service: General;  Laterality: N/A;   PORTACATH PLACEMENT Right 02/11/2020   Procedure: INSERTION PORT-A-CATH WITH ULTRASOUND GUIDANCE;  Surgeon: Emelia Loron, MD;  Location:  SURGERY CENTER;  Service: General;  Laterality: Right;   TUBAL LIGATION     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) 04/01/2022   Encounter for dental examination 03/11/2022   Acute apical periodontitis 03/11/2022   Phobia of dental procedure 03/11/2022   Defective dental restoration 03/11/2022   Accretions on teeth 03/11/2022   Chronic periodontitis 03/11/2022   Teeth missing 03/11/2022   Caries 03/11/2022   Generalized gingival recession 03/11/2022   Pain, dental 03/08/2022   Cancer-related pain 03/08/2022   Port-A-Cath in place 02/15/2022   Oral allergy syndrome, subsequent encounter 02/03/2022   Adverse effect of other drugs, medicaments and biological substances, subsequent encounter 02/02/2022   Other adverse food reactions, not elsewhere classified, subsequent encounter 02/02/2022  Chest tightness 02/02/2022   Other allergic rhinitis 02/02/2022   Allergic conjunctivitis of both eyes 02/02/2022   Language barrier 12/11/2020   Morbid obesity (HCC)    History of breast cancer    Genetic testing 02/13/2020   Malignant neoplasm of upper-outer quadrant of left breast in female, estrogen receptor positive (HCC) 01/27/2020   Impaired ability to use community resources due to language barrier 07/13/2013    PCP: Angus Seller, NP  REFERRING PROVIDER: Rachel Moulds, MD  REFERRING DIAG: Left  UE lymphedema  THERAPY DIAG:  Postmastectomy lymphedema  Stiffness of left shoulder, not elsewhere classified  Aftercare following surgery for neoplasm  Abnormal posture  Muscle weakness (generalized)  ONSET DATE: 08/01/22  Rationale for Evaluation and Treatment: Rehabilitation  SUBJECTIVE:                                                                                                                                                                                           SUBJECTIVE STATEMENT:  I didn't wear my sleeve or do any exercises for three days and my arm got more swollen.   PERTINENT HISTORY:  Patient was diagnosed on 01/23/2020 with left grade III invasive ductal carcinoma breast cancer. It is ER positive, PR negative, and HER2 negative , but functionally Triple Negative,with a Ki67 of 40%. Patient with interpreter present. She underwent neoadjuvant chemotherapy from 02/12/2020 - 05/28/2020. 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 She underwent a left lumpectomy and sentinel node biopsy ( 4 negative nodes) on 07/15/2020. She underwent radiation and anti-estrogen therapy.  She had interval development of diffuse skin thickening in 2023. She had a punch biopsy of her skin at that time and this was consistent with inflammatory breast cancer. She has been treated with chemo since then. She had a left modified radical mastectomy on 08/22/2022 with complete therapeutic response and 0/1 LN. She noted some swelling in her left arm and on 08/25/2022 she had an [place] that was negative for DVT. Her SOZO screen on 09/19/2021 was 6.9 points above baseline putting her in the yellow zone and she was given a compression sleeve that was not containing  her so a flat knit sleeve and glove and nighttime garment  have been ordered   PAIN:  Are you having pain? no  PRECAUTIONS: Other: lymphedema [name]  WEIGHT BEARING RESTRICTIONS: No  FALLS:  Has patient fallen in last 6 months?  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 Fullness in left lateral chest and back as well as left arm and hand especially on thumb side   OBSERVATIONS / OTHER ASSESSMENTS: scar well healed, increased edema at lateral trunk and throughout [name] reddened area in radiation field with skin appearing fragile and irritated but no open areas   POSTURE: forward head, rounded shoulders  UPPER EXTREMITY AROM/PROM:  A/PROM RIGHT   eval   Shoulder extension 68  Shoulder flexion 160  Shoulder abduction 175  Shoulder internal rotation 56  Shoulder external rotation 88    (Blank rows = not tested)  A/PROM LEFT   eval LEFT 11/10/22 LEFT 12/06/22 LEFT 12/08/2022 LEFT 12/19/2022 LEFT 01/23/23  Shoulder extension 52       Shoulder flexion 146 150 156 155 165 162  Shoulder abduction 148 160 164 170 170 172  Shoulder internal rotation 67       Shoulder external rotation 86         (Blank rows = not tested)   LYMPHEDEMA ASSESSMENTS:  SURGERY TYPE/DATE: Left Lumpectomy with SLNB11/11/2019, Left modified mastectomy with left axillary node dissection NUMBER OF LYMPH NODES REMOVED: 0/4 with lumpectomy, 0/1 during Mastectomy CHEMOTHERAPY: yes RADIATION:Yes HORMONE TREATMENT: may require INFECTIONS: none  LYMPHEDEMA ASSESSMENTS:  LANDMARK RIGHT  eval RIGHT 11/08/22  10 cm  proximal to olecranon process 31.4 34.4  Olecranon process 27.2 28  10  cm proximal to ulnar styloid process 25.7 25.5  Just proximal to ulnar styloid process 18.4 19  Across hand at thumb web space 20 20  At base of 2nd digit 6.4 6.5  (Blank rows = not tested)  LANDMARK LEFT  eval LEFT 10/25/22 Left  11/03/22 LEFT 11/08/22 LEFT  12/08/2022   10 cm proximal to olecranon process 33 33 31.9 33 34.3  Olecranon process 28.4 28.5 28.5 29 28.7  10 cm proximal to ulnar styloid process 26 26.5 26.4 26.6 26.5  Just proximal to ulnar styloid process 19.4 19 18.9 19.1 18.2  Across hand at thumb web space 21 20.5 19.7 20.7 19.5  2nd digit    6.7 6.5   TODAY'S TREATMENT:   01/23/23 Remeasured ROM Pelvic tilts with 5 sec holds x 10 reps Pelvic tilts with gentle alternating hip abduction in hook lying x 10 reps Pelvic tilts with alternating marching in hook lying x 10 reps Standing with back against wall with core engaged and shoulders back -3 way shoulder with 1 lb weight with pt reporting difficulty with this and back pain Educated pt about why exercising can help back pain and how tight pecs and weak back muscles causing increased back pain and the role of core muscles in helping support the low back Supine horizontal abduction with yellow band x 10 reps Supine overhead flexion with yellow band x 10 reps No back pain by end of session  01/13/23: Discussed how current glove is not managing her swelling. Discussed her current schedule for wearing her night time and day time garment. When pt takes off her night time garment she does her morning routine prior to  donning day time garment. Educated pt to immediately don day time garment. Pt reports it is difficult to get ready, get her kids ready and cook breakfast without soiling glove so issued her disposable gloves to don over her glove. Also, her current glove is too loose and there is fabric at back of glove that can be pinched. Emailed [employer] about a  Therapist, music. Educated pt that she can still bandage if she feels her hand is swelling. Began AAROM exercises today: Pulleys x 2 min in direction of flexion and abduction with v/c to decrease scapular compensation Ball up wall x 10 reps in direction of flexion and abduction with pt returning therapist demo - pt reported back pain with this so educated pt on importance of posture and need for core strength to protect back Pelvic tilts with max v/c and t/c initially and therapist demo with pt able to return demo by end of session - issued this as part of HEP  01/03/23: Demonstrated correct way to don/doff compression sleeve and glove. Had pt return demonstrate. Sleeve fit very well. Educated pt on care instructions and educated her to wear her sleeve during the day and apply compression bandages at night.   12/28/2022  MLD: Short neck, L 5 diaphragmatic breaths, bil axillary nodes and establishment of interaxillary pathway, Lt inguinal nodes, Lt axillo-inguinal anastomosis spending increased time in area of fullness inferior to axilla, L UE working proximal to distal, then retracing all steps.  Educated pt about availability of compression tank tops and educated her that this may help since her compression bra has not been helping lateral trunk edema and digs in right below her swelling Compression Bandaging: TG soft from hand to axilla,  mollelast to digits 1-4 with  , artiflex from hand to axilla, 1 6 cm bandage at hand, 1 8 cm bandage from wrist to axilla with x at antecubital fossa and 1 12 cm bandage from wrist to axilla   12/21/2022: Rep from [employer] in clinic today. Arrangements were made for rep to come to our clinic at 11:00 on 01/03/2023 to do the demonstration for the [employer] and fill out the needed paperwork to see if pt qualifies for a donated garment. Also checked on the sleeve as the [employer] portal says that they are waiting for payment and pt given information to contact them about it.  Pt  askes for MLD only as she cannot do that at home so it was performed as on 12/19/2022 with extra time and stretch to anterior chest and MLD at fullness at anterior and posterior axilla.    12/19/2022  MLD: Short neck, L 5 diaphragmatic breaths, bil axillary nodes and establishment of interaxillary pathway, Lt inguinal nodes, Lt axillo-inguinal anastomosis L UE working proximal to distal especially at thumb side of hand with arm elevated. then retracing all steps. Then to sidelying for more prolonged work on posterior interaxillary anastamosis with gentle pressure from one hand on lateral chest and axilla. In sidelying, small circles with hand pointed to ceiling, shoulder flexion and external rotation. X 10  Talked with pt about [employer] but she says she has not heard from them yet, but may have not picked up their call.  Gave pt [employer] card and asked to call them to check on it herself ( with assist from English speaking family) Reinforced scar massage and skin stretch at anterior chest since she is healed from radiation   12/15/2022  MLD: Short neck, L 5 diaphragmatic breaths, bil axillary nodes  and establishment of interaxillary pathway, Lt inguinal nodes, Lt axillo-inguinal anastomosis avoiding radiation field tenderness, L UE working proximal to distal, then retracing all steps. Then to sidelying for more prolonged work on Merchandiser, retail. supine dowel stretch in flexion and abduction In sidelying, small circles with hand pointed to ceiling,x 5  short range abduction abduction with straight arm, shoulder flexion and external rotation. X 10  Compression Bandaging: TG soft from hand to axilla,  mollelast to digits 1-5 with  , gray foam over dorsum of hand and wrist, artiflex from hand to axilla, 1 6 cm bandage at hand, 1 8 cm bandage from wrist to axilla with x at antecubital fossa and 1 12 cm bandage from wrist to axilla   12/06/2022 MLD: Short neck, L 5 diaphragmatic breaths, R  axillary nodes and establishment of interaxillary pathway, Lt inguinal nodes, Lt axillo-inguinal anastomosis avoiding radiation field, L UE working proximal to distal, then retracing all steps.  PROM to L shoulder gentle in to flexion and abduction making sure to avoid any pulling at fragile skin Compression Bandaging: TG soft from hand to axilla,  mollelast to digits 1-4 with  , artiflex from hand to axilla, 1 6 cm bandage at hand, 1 8 cm bandage from wrist to axilla in herringbone fashion and 1 12 cm bandage from wrist to axilla   12/01/2022 MLD: Short neck, L 5 diaphragmatic breaths, R axillary nodes and establishment of interaxillary pathway, Lt inguinal nodes, Lt axillo-inguinal anastomosis avoiding radiation field, L UE working proximal to distal, then to R s/l to work on L lateral trunk moving fluid towards pathways then back to supine retracing all steps.  PROM to L shoulder gentle in to flexion and abduction making sure to avoid any pulling at fragile skin  11/29/2022 MLD: Short neck, L 5 diaphragmatic breaths, R axillary nodes and establishment of interaxillary pathway, Lt inguinal nodes, Lt axillo-inguinal anastomosis avoiding radiation field, L UE working proximal to distal, then to R s/l to work on L lateral trunk moving fluid towards pathways then back to supine retracing all steps.  PROM to L shoulder gentle in to flexion and abduction making sure to avoid any pulling at fragile skin Compression Bandaging: TG soft from hand to axilla,  mollelast to digits 1-5 with  , gray foam over dorsum of hand and wrist, artiflex from hand to axilla, 1 6 cm bandage at hand, 1 8 cm bandage from wrist to axilla with x at antecubital fossa and 1 12 cm bandage from wrist to axilla    11/24/2022 MLD: Short neck, Lt inguinal nodes, Lt axillo-inguinal anastomosis, avoiding radiation field, L UE working proximal to distal then retracing all steps. Did not work on lateral trunk due to compromised skin from  radiation Compression Bandaging: TG soft from hand to axilla,  mollelast to digits 1-5 with  , gray foam over dorsum of hand and wrist, artiflex from hand to axilla, 1 6 cm bandage at hand, 1 8 cm bandage from wrist to axilla with x at antecubital fossa and 1 12 cm bandage from wrist to axilla  PATIENT EDUCATION:  Education details:how weak/overstretched back muscles with tight pecs can cause back pain, how weak core can cause back pain Person educated: Patient Education method: Explanation handout given  Education comprehension: verbalized understanding  HOME EXERCISE PROGRAM: Obtain compression bra, continue to wear sleeve and glove Pelvic tilts x 10 reps each with 5 sec holds  Add hip abduction with pelvic tilt  Add marching with pelvic tilt Supine overhead flexion and horizontal abduction with yellow band  ASSESSMENT:  CLINICAL IMPRESSION: Reminded pt of appointment to be remeasured for a new compression glove on Wednesday. Pt reports continued thoracic and low back pain. Reviewed findings of thoracic imaging done in 2023. Findings were narrow disc space and spurring at end plates. Educated pt about how spurring and narrowed disc space can cause pain but also how musculare imbalances cause pain. Continued to add to strengthening exercises for core and back and pt's back felt better by end of session. Pt reports she will exercise more at home.   OBJECTIVE IMPAIRMENTS: decreased knowledge of condition, decreased ROM, decreased strength, increased edema, increased fascial restrictions, impaired UE functional use, postural dysfunction, and pain.   ACTIVITY LIMITATIONS: carrying, lifting, and reach over head  PARTICIPATION LIMITATIONS: cleaning, laundry, community activity, occupation, and yard work  PERSONAL FACTORS: Time since onset of injury/illness/exacerbation  are also affecting patient's functional outcome.   REHAB POTENTIAL: Good  CLINICAL DECISION MAKING: Stable/uncomplicated  EVALUATION COMPLEXITY: Low  GOALS: Goals reviewed with patient? Yes   SHORT TERM GOALS = LONG TERM GOALS: Target date: 11/09/22  Pt will be independent in self MLD for long term management of lymphedema.  Baseline:  Goal status: IN PROGRESS   2.  Pt will obtain appropriate compression garments for long term management of lymphedema.  Baseline:  Goal status: IN PROGRESS  3.  Pt will demonstrate 160 degrees of L shoulder flexion to allow her to reach overhead. Baseline:  Goal status: IN PROGRESS 11/09/2022:  155  4. Pt will demonstrate 170 degrees of L shoulder abduction to allow her to reach out to the side.   Goal: IN PROGRESS 12/08/22 - 164  5. Pt will be independent in a home exercise program for continued stretching and strengthening.  Goal: IN PROGRESS   6. Pt will report she is able to raise her L arm to end range wihtout increased pain in her axilla to allow improved comfort.   GOAL: IN PROGRESS - pt still having pain   PLAN:  PT FREQUENCY:1- 2x/week  PT DURATION: 8 weeks   PLANNED INTERVENTIONS: Therapeutic exercises, Therapeutic activity, Patient/Family education, Self Care, Joint mobilization, Manual lymph drainage, Compression bandaging, scar mobilization, Taping, Vasopneumatic device, and Manual therapy  PLAN FOR NEXT SESSION how were pelvic tilts?, continue UE strengthening and strengthening,  cont MLD to L UE and lateral trunk and, progress exercises to include stretching strengthening , make sure garments fit and pt is able to manage her self care at home prior to discharge      Leonette Most, PT 01/23/2023, 12:20 PM [facility] [address] [place], [place], [address] Phone: [phone]   Fax:  [phone]

## 2023-01-26 ENCOUNTER — Telehealth: Payer: Self-pay | Admitting: *Deleted

## 2023-01-26 ENCOUNTER — Telehealth: Payer: Self-pay | Admitting: Nurse Practitioner

## 2023-01-26 NOTE — Telephone Encounter (Signed)
Copied from CRM [id number]. Topic: General - Inquiry >> Jan 26, 2023 10:59 AM De Blanch wrote: Reason for CRM: Pt stated she would like to know why her orange card was only approved for a month.   She stated that she would also like to renew it.  Please advise.

## 2023-01-26 NOTE — Telephone Encounter (Addendum)
This RN called pt per MD request with use of [employer] due to language barrier concern.  Detailed VM left on identified VM- requesting return call for follow up of eye issues- as well as if she picked eyes drops and to discuss possible need to see an eye doctor.  Note generic restasis was prescribed on 01/23/2023.  This message will be forwarded to corresponding staff for review of communication and possible further inquiry by PT per appt on 01/30/2023.    ----- Message from Rachel Moulds, MD sent at 01/23/2023 12:24 PM EDT ----- Regarding: RE: Eye problems Val  I think she should see ophthalmology as well incase her eye duct is blocked to open it up. If she agrees, can we send her to opthal.  Thanks, ----- Message ----- From: Jeanella Craze, Remi Deter, PT Sent: 01/23/2023  12:05 PM EDT To: Rachel Moulds, MD Subject: Eye problems                                   Good morning, I just wanted to let you know that Shawny is having a lot of eye issues. Her eyelids are sticking together and staying closed when she blinks and she as to sometimes use her fingers to open them. There is a lot of inflammation around her eyes especially her R one. I just wanted to let you know. I know she sees you next week. She was just very anxious about not being able to open her eye when she blinks. Thanks! Pamala Duffel

## 2023-01-30 ENCOUNTER — Ambulatory Visit: Payer: No Typology Code available for payment source | Admitting: Physical Therapy

## 2023-01-31 ENCOUNTER — Inpatient Hospital Stay: Payer: No Typology Code available for payment source

## 2023-01-31 ENCOUNTER — Other Ambulatory Visit: Payer: Self-pay

## 2023-01-31 ENCOUNTER — Encounter: Payer: Self-pay | Admitting: Adult Health

## 2023-01-31 ENCOUNTER — Other Ambulatory Visit (HOSPITAL_COMMUNITY): Payer: Self-pay

## 2023-01-31 ENCOUNTER — Encounter: Payer: Self-pay | Admitting: Hematology and Oncology

## 2023-01-31 ENCOUNTER — Inpatient Hospital Stay: Payer: No Typology Code available for payment source | Attending: Hematology and Oncology | Admitting: Adult Health

## 2023-01-31 VITALS — BP 102/65 | HR 88 | Temp 98.7°F | Resp 18 | Ht 61.0 in | Wt 195.4 lb

## 2023-01-31 DIAGNOSIS — Z171 Estrogen receptor negative status [ER-]: Secondary | ICD-10-CM | POA: Insufficient documentation

## 2023-01-31 DIAGNOSIS — Z170 Estrogen receptor positive status [ER+]: Secondary | ICD-10-CM

## 2023-01-31 DIAGNOSIS — Z923 Personal history of irradiation: Secondary | ICD-10-CM | POA: Insufficient documentation

## 2023-01-31 DIAGNOSIS — Z8616 Personal history of COVID-19: Secondary | ICD-10-CM | POA: Insufficient documentation

## 2023-01-31 DIAGNOSIS — C50412 Malignant neoplasm of upper-outer quadrant of left female breast: Secondary | ICD-10-CM | POA: Insufficient documentation

## 2023-01-31 DIAGNOSIS — E559 Vitamin D deficiency, unspecified: Secondary | ICD-10-CM | POA: Insufficient documentation

## 2023-01-31 LAB — COMPREHENSIVE METABOLIC PANEL
ALT: 13 U/L (ref 0–44)
AST: 18 U/L (ref 15–41)
Albumin: 4.1 g/dL (ref 3.5–5.0)
Alkaline Phosphatase: 102 U/L (ref 38–126)
Anion gap: 9 (ref 5–15)
BUN: 15 mg/dL (ref 6–20)
CO2: 25 mmol/L (ref 22–32)
Calcium: 9.2 mg/dL (ref 8.9–10.3)
Chloride: 105 mmol/L (ref 98–111)
Creatinine, Ser: 0.79 mg/dL (ref 0.44–1.00)
GFR, Estimated: >60 mL/min (ref >60)
Glucose, Bld: 127 mg/dL — ABNORMAL HIGH (ref 70–99)
Potassium: 3.6 mmol/L (ref 3.5–5.1)
Sodium: 139 mmol/L (ref 135–145)
Total Bilirubin: 0.9 mg/dL (ref 0.3–1.2)
Total Protein: 6.8 g/dL (ref 6.5–8.1)

## 2023-01-31 LAB — CBC WITH DIFFERENTIAL/PLATELET
Abs Immature Granulocytes: 0.02 10*3/uL (ref 0.00–0.07)
Basophils Absolute: 0 10*3/uL (ref 0.0–0.1)
Basophils Relative: 0 %
Eosinophils Absolute: 0.1 10*3/uL (ref 0.0–0.5)
Eosinophils Relative: 3 %
HCT: 33.7 % — ABNORMAL LOW (ref 36.0–46.0)
Hemoglobin: 11.6 g/dL — ABNORMAL LOW (ref 12.0–15.0)
Immature Granulocytes: 1 %
Lymphocytes Relative: 30 %
Lymphs Abs: 1.1 10*3/uL (ref 0.7–4.0)
MCH: 29.7 pg (ref 26.0–34.0)
MCHC: 34.4 g/dL (ref 30.0–36.0)
MCV: 86.4 fL (ref 80.0–100.0)
Monocytes Absolute: 0.3 10*3/uL (ref 0.1–1.0)
Monocytes Relative: 7 %
Neutro Abs: 2.1 10*3/uL (ref 1.7–7.7)
Neutrophils Relative %: 59 %
Platelets: 145 10*3/uL — ABNORMAL LOW (ref 150–400)
RBC: 3.9 MIL/uL (ref 3.87–5.11)
RDW: 20.4 % — ABNORMAL HIGH (ref 11.5–15.5)
WBC: 3.6 10*3/uL — ABNORMAL LOW (ref 4.0–10.5)
nRBC: 0 % (ref 0.0–0.2)

## 2023-01-31 MED ORDER — DIPHENHYDRAMINE HCL 25 MG PO TABS
50.0000 mg | ORAL_TABLET | ORAL | 0 refills | Status: DC
Start: 2023-01-31 — End: 2023-07-13
  Filled 2023-01-31: qty 1, fill #0
  Filled 2023-01-31: qty 2, 1d supply, fill #0

## 2023-01-31 MED ORDER — PREDNISONE 50 MG PO TABS
ORAL_TABLET | ORAL | 0 refills | Status: DC
Start: 2023-01-31 — End: 2023-07-13
  Filled 2023-01-31: qty 3, 1d supply, fill #0

## 2023-01-31 NOTE — Progress Notes (Signed)
[facility] Cancer Follow up:    Monica Andrew, NP [address] [place] [address]   DIAGNOSIS:  Cancer Staging  Malignant neoplasm of upper-outer quadrant of left breast in female, estrogen receptor positive (HCC) Staging form: Breast, AJCC 8th Edition - Clinical stage from 01/29/2020: Stage IIIA (cT2, cN1, cM0, G3, ER+, PR-, HER2-) - Signed by Rachel Moulds, MD on 06/28/2022 Stage prefix: Initial diagnosis Histologic grading system: 3 grade system - Pathologic stage from 07/15/2020: No Stage Recommended (ypT1b, pN0, cM0, G3, ER-, PR-, HER2-) - Signed by Loa Socks, NP on 04/01/2022 Stage prefix: Post-therapy Histologic grading system: 3 grade system   SUMMARY OF ONCOLOGIC HISTORY: Oncology History  Malignant neoplasm of upper-outer quadrant of left breast in female, estrogen receptor positive (HCC)  01/23/2020 Initial Diagnosis   [place] woman status post left breast upper outer quadrant biopsy 01/23/2020 for a clinical T3 N1, 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             (a) chest CT scan and bone scan 02/07/2020 showed no evidence of metastatic disease   01/29/2020 Cancer Staging   Staging form: Breast, AJCC 8th Edition - Clinical stage from 01/29/2020: Stage IIIA (cT2, cN1, cM0, G3, ER+, PR-, HER2-) - Signed by Rachel Moulds, MD on 06/28/2022 Stage prefix: Initial diagnosis Histologic grading system: 3 grade system   02/07/2020 Genetic Testing   Negative genetic testing. No pathogenic variants identified on the [facility] Common Hereditary Cancers Panel. VUS in MSH6 called c.2744C>G identified. The report date is 02/07/2020.  The Common Hereditary Cancers Panel offered by [facility] includes sequencing and/or deletion duplication testing of the following 48 genes: APC, ATM, AXIN2, BARD1, BMPR1A, BRCA1, BRCA2, BRIP1, CDH1, CDKN2A (p14ARF), CDKN2A (p16INK4a), CKD4, CHEK2, CTNNA1,  DICER1, EPCAM (Deletion/duplication testing only), GREM1 (promoter region deletion/duplication testing only), KIT, MEN1, MLH1, MSH2, MSH3, MSH6, MUTYH, NBN, NF1, NHTL1, PALB2, PDGFRA, PMS2, POLD1, POLE, PTEN, RAD50, RAD51C, RAD51D, RNF43, SDHB, SDHC, SDHD, SMAD4, SMARCA4. STK11, TP53, TSC1, TSC2, and VHL.  The following genes were evaluated for sequence changes only: SDHA and HOXB13 c.251G>A variant only.   02/12/2020 - 05/28/2020 Neo-Adjuvant Chemotherapy   neoadjuvant chemotherapy consisting of doxorubicin and cyclophosphamide in dose dense fashion x4 started 02/12/2020, completed 03/25/2020, followed by paclitaxel and carboplatin weekly x12 started 04/08/2020 and completed on 05/28/2020             (a) echo 02/07/2020 shows an ejection fraction in the 55-60% range             (b) Epirubicin substituted for Doxorubicin starting with cycle 2 of AC secondary to allergic rash from Doxorubicin   07/15/2020 Surgery   status post left lumpectomy and sentinel lymph node sampling 07/15/2020 for a ypT1b ypN0 residual invasive ductal carcinoma, grade 3, with negative margins.             (a) a total of 4 left axillary lymph nodes were removed             (b) repeat prognostic panel on the final pathology again triple negative, with an MIB-1 of 50%.   07/15/2020 Cancer Staging   Staging form: Breast, AJCC 8th Edition - Pathologic stage from 07/15/2020: No Stage Recommended (ypT1b, pN0, cM0, G3, ER-, PR-, HER2-) - Signed by Loa Socks, NP on 04/01/2022 Stage prefix: Post-therapy Histologic grading system: 3 grade system   08/26/2020 - 10/09/2020 Radiation Therapy   adjuvant radiation  completed 10/09/2020             (a) sensitizing capecitabine attempted but not tolerated by the patient  08/26/2020 through 10/09/2020 Site Technique Total Dose (Gy) Dose per Fx (Gy) Completed Fx Beam Energies  Breast, Left: Breast_Lt 3D 50/50 2 25/25 15X  Breast, Left: Breast_Lt_PAB_SCV 3D 50/50 2 25/25 10X, 15X   Breast, Left: Breast_Lt_Bst 3D 10/10 2 5/5 6X, 10X    11/23/2020 - 01/04/2021 Adjuvant Chemotherapy   adjuvant pembrolizumab started 11/23/2020, to continue for 1 year             (a) discontinued after 01/04/2021 dose with multiple side effects requiring steroids for resolution   03/30/2022 Mammogram   Mammogram and ultrasound show numerous masses throughout left breast not visualized in 01/2022, +left axillary lymphadenopathy, skin thickening in right breast and in upper abdomen just below sternum   04/05/2022 - 07/20/2022 Chemotherapy   Patient is on Treatment Plan : BREAST METASTATIC Fam-Trastuzumab Deruxtecan-nxki (Enhertu) (5.4) q21d     08/21/2022 Surgery   Left breast mastectomy and lymph node biopsy shows no residual carcinoma.     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  Prognostic panel sent to [facility]: Left breast skin, punch biopsy: Skin with dermal lymphatic involvement by carcinoma (confirmed with outside CK7 IHC, reviewed). Biomarkers are reported as negative for ER, PR, Her2/neu (confirmed on review but please note sample size is extremely small).   02/15/2022 - 03/08/2022 Chemotherapy    Taxotere/Cytoxan x 2, discontinued due to progression of breast.   03/30/2022 Mammogram   Mammogram and ultrasound show numerous masses throughout left breast not visualized in 01/2022, +left axillary lymphadenopathy, skin thickening in right breast and in upper abdomen just below sternum   04/05/2022 - 07/20/2022 Chemotherapy   Patient is on Treatment Plan : BREAST METASTATIC Fam-Trastuzumab Deruxtecan-nxki (Enhertu) (5.4) q21d     11/25/2022 -  Chemotherapy   Patient is on Treatment Plan : BREAST Capecitabine q21d       CURRENT THERAPY: capecitabine  INTERVAL HISTORY: [name] 47 y.o. female  returns for f/u of her metasattic breast cancer currently on treatment with Capecitabine, 500mg ; 3 tabs Q12 hours days 1-14 taken every 21 days.  She was previously taking a higher dose and at that dose she was having more irritation in her eyes with tearing and drainage along with more tenderness in her hands and feet.  Both of these things have improved to though they are still present.  She also notes that she has some intermittent pain in her chest and sometimes feels shortness of breath.  These typically do not happen at the same time however she is concerned about these sensations when they occur.  She also notes intermittent stomach pain.  There is no specific pattern.  She denies any nausea or constipation.   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) 04/01/2022   Encounter for dental examination 03/11/2022   Acute apical periodontitis 03/11/2022   Phobia of dental procedure 03/11/2022   Defective dental restoration 03/11/2022   Accretions on  teeth 03/11/2022   Chronic periodontitis 03/11/2022   Teeth missing 03/11/2022   Caries 03/11/2022   Generalized gingival recession 03/11/2022   Pain, dental 03/08/2022   Cancer-related pain 03/08/2022   Port-A-Cath in place 02/15/2022   Oral allergy syndrome, subsequent encounter 02/03/2022   Adverse effect of other drugs, medicaments and biological substances, subsequent encounter 02/02/2022   Other adverse food reactions, not elsewhere classified, subsequent encounter 02/02/2022   Chest tightness 02/02/2022   Other allergic rhinitis 02/02/2022   Allergic conjunctivitis of both eyes 02/02/2022   Language barrier 12/11/2020   Morbid obesity (HCC)    History of breast cancer    Genetic testing 02/13/2020    Malignant neoplasm of upper-outer quadrant of left breast in female, estrogen receptor positive (HCC) 01/27/2020   Impaired ability to use community resources due to language barrier 07/13/2013    is allergic to omnipaque [iohexol], doxorubicin hcl, keytruda [pembrolizumab], other, and tape.  MEDICAL HISTORY: Past Medical History:  Diagnosis Date   Allergy    Asthma    Breast cancer in female [facility])    Left   Breast disorder    breast cancer May 2021   Chronic back pain    Headache    High triglycerides    History of breast cancer    History of COVID-19 04/20/2021   Hx of migraines    Morbid obesity (HCC)    Personal history of chemotherapy    Personal history of radiation therapy     SURGICAL HISTORY: Past Surgical History:  Procedure Laterality Date   BREAST LUMPECTOMY     BREAST LUMPECTOMY WITH RADIOACTIVE SEED AND SENTINEL LYMPH NODE BIOPSY Left 07/15/2020   Procedure: LEFT BREAST LUMPECTOMY WITH RADIOACTIVE SEED AND LEFT AXILLARY SENTINEL LYMPH NODE BIOPSY, LEFT AXILLARY NODE RADIOACTIVE SEED GUIDED EXCISION, LEFT BREAST RADIOACTIVE SEED GUIDED EXCISION IM NODE;  Surgeon: Emelia Loron, MD;  Location: [facility] OR;  Service: General;  Laterality: Left;  PEC BLOCK   CESAREAN SECTION WITH BILATERAL TUBAL LIGATION Bilateral 07/23/2015   Procedure: CESAREAN SECTION WITH BILATERAL TUBAL LIGATION;  Surgeon: Reva Bores, MD;  Location: [facility];  Service: Obstetrics;  Laterality: Bilateral;   IR IMAGING GUIDED PORT INSERTION  02/11/2022   MODIFIED MASTECTOMY Left 08/22/2022   Procedure: LEFT MODIFIED RADICAL MASTECTOMY, LEFT AXILLARY NODE DISSECTION, AND SKIN BIOPSY OF BACK;  Surgeon: Emelia Loron, MD;  Location: [facility];  Service: General;  Laterality: Left;   PORT-A-CATH REMOVAL N/A 04/30/2021   Procedure: REMOVAL PORT-A-CATH;  Surgeon: Emelia Loron, MD;  Location: [facility];  Service: General;  Laterality: N/A;   PORTACATH PLACEMENT Right 02/11/2020    Procedure: INSERTION PORT-A-CATH WITH ULTRASOUND GUIDANCE;  Surgeon: Emelia Loron, MD;  Location: [facility];  Service: General;  Laterality: Right;   TUBAL LIGATION      SOCIAL HISTORY: Social History   Socioeconomic History   Marital status: Legally Separated    Spouse name: Not on file   Number of children: 3   Years of education: Not on file   Highest education level: 9th grade  Occupational History   Occupation: Holiday representative  Tobacco Use   Smoking status: Never   Smokeless tobacco: Never  Vaping Use   Vaping Use: Never used  Substance and Sexual Activity   Alcohol use: No   Drug use: Never   Sexual activity: Yes    Birth control/protection: Surgical  Other Topics Concern   Not on file  Social History Narrative   Not on  file   Social Determinants of Health   Financial Resource Strain: Not on file  Food Insecurity: No Food Insecurity (10/11/2022)   Hunger Vital Sign    Worried About Running Out of Food in the Last Year: Never true    Ran Out of Food in the Last Year: Never true  Transportation Needs: No Transportation Needs (10/11/2022)   PRAPARE - Administrator, Civil Service (Medical): No    Lack of Transportation (Non-Medical): No  Physical Activity: Not on file  Stress: Not on file  Social Connections: Not on file  Intimate Partner Violence: Not At Risk (10/11/2022)   Humiliation, Afraid, Rape, and Kick questionnaire    Fear of Current or Ex-Partner: No    Emotionally Abused: No    Physically Abused: No    Sexually Abused: No    FAMILY HISTORY: Family History  Problem Relation Age of Onset   Heart disease Maternal Grandmother     Review of Systems  Constitutional:  Negative for appetite change, chills, fatigue, fever and unexpected weight change.  HENT:   Negative for hearing loss, lump/mass and trouble swallowing.   Eyes:  Positive for eye problems. Negative for icterus.  Respiratory:  Negative for chest tightness,  cough and shortness of breath.   Cardiovascular:  Negative for chest pain, leg swelling and palpitations.  Gastrointestinal:  Negative for abdominal distention, abdominal pain, constipation, diarrhea, nausea and vomiting.  Endocrine: Negative for hot flashes.  Genitourinary:  Negative for difficulty urinating.   Musculoskeletal:  Negative for arthralgias.  Skin:  Negative for itching and rash.  Neurological:  Negative for dizziness, extremity weakness, headaches and numbness.  Hematological:  Negative for adenopathy. Does not bruise/bleed easily.  Psychiatric/Behavioral:  Negative for depression. The patient is not nervous/anxious.       PHYSICAL EXAMINATION    Vitals:   01/31/23 1133  BP: 102/65  Pulse: 88  Resp: 18  Temp: 98.7 F (37.1 C)  SpO2: 98%    Physical Exam Constitutional:      General: She is not in acute distress.    Appearance: Normal appearance. She is not toxic-appearing.  HENT:     Head: Normocephalic and atraumatic.  Eyes:     General: No scleral icterus. Cardiovascular:     Rate and Rhythm: Normal rate and regular rhythm.     Pulses: Normal pulses.     Heart sounds: Normal heart sounds.  Pulmonary:     Effort: Pulmonary effort is normal.     Breath sounds: Normal breath sounds.  Abdominal:     General: Abdomen is flat. Bowel sounds are normal. There is no distension.     Palpations: Abdomen is soft.     Tenderness: There is no abdominal tenderness.  Musculoskeletal:        General: No swelling.     Cervical back: Neck supple.  Lymphadenopathy:     Cervical: No cervical adenopathy.  Skin:    General: Skin is warm and dry.     Findings: No rash.  Neurological:     General: No focal deficit present.     Mental Status: She is alert.  Psychiatric:        Mood and Affect: Mood normal.        Behavior: Behavior normal.     LABORATORY DATA:  CBC    Component Value Date/Time   WBC 3.6 (L) 01/31/2023 1115   RBC 3.90 01/31/2023 1115   HGB  11.6 (L) 01/31/2023 1115  HGB 11.6 (L) 07/20/2022 0856   HGB 13.3 10/01/2021 1004   HCT 33.7 (L) 01/31/2023 1115   HCT 40.4 10/01/2021 1004   PLT 145 (L) 01/31/2023 1115   PLT 220 07/20/2022 0856   PLT 212 10/01/2021 1004   MCV 86.4 01/31/2023 1115   MCV 87 10/01/2021 1004   MCH 29.7 01/31/2023 1115   MCHC 34.4 01/31/2023 1115   RDW 20.4 (H) 01/31/2023 1115   RDW 13.3 10/01/2021 1004   LYMPHSABS 1.1 01/31/2023 1115   LYMPHSABS 1.6 10/01/2021 1004   MONOABS 0.3 01/31/2023 1115   EOSABS 0.1 01/31/2023 1115   EOSABS 0.1 10/01/2021 1004   BASOSABS 0.0 01/31/2023 1115   BASOSABS 0.0 10/01/2021 1004    CMP     Component Value Date/Time   NA 139 01/31/2023 1115   NA 138 09/30/2022 1149   K 3.6 01/31/2023 1115   CL 105 01/31/2023 1115   CO2 25 01/31/2023 1115   GLUCOSE 127 (H) 01/31/2023 1115   BUN 15 01/31/2023 1115   BUN 11 09/30/2022 1149   CREATININE 0.79 01/31/2023 1115   CREATININE 0.78 12/23/2022 0959   CALCIUM 9.2 01/31/2023 1115   PROT 6.8 01/31/2023 1115   PROT 7.1 09/30/2022 1149   ALBUMIN 4.1 01/31/2023 1115   ALBUMIN 4.5 09/30/2022 1149   AST 18 01/31/2023 1115   AST 18 12/23/2022 0959   ALT 13 01/31/2023 1115   ALT 13 12/23/2022 0959   ALKPHOS 102 01/31/2023 1115   BILITOT 0.9 01/31/2023 1115   BILITOT 0.5 12/23/2022 0959   GFRNONAA >60 01/31/2023 1115   GFRNONAA >60 12/23/2022 0959   GFRAA >60 06/03/2020 0949   GFRAA >60 02/26/2020 0915      ASSESSMENT and THERAPY PLAN:   Malignant neoplasm of upper-outer quadrant of left breast in female, estrogen receptor positive (HCC) [name] is a 47 year old woman accompanied by [name] [name] here today for follow-up while taking capecitabine twice daily.  I am concerned about her new discomfort in her chest with the breathing changes and the abdominal discomfort.  I have placed orders for repeat CT chest abdomen and pelvis to further evaluate.  She has a contrast allergy and I sent steroid  and Benadryl prep into her pharmacy.  I reviewed with her how to take it in detail.  She will continue on Capecitabine 1500mg  po BID 14 days on and 7 days off.    I recommended that she get Udder cream or Cetaphil to help with the dryness in her hands which is a side effect from the Capecitabine.   I reminded her the importance of getting in with ophthalmology.  She is going to schedule an appointment today.    Koriana will return on 02/21/2023 for labs, f/u with Dr. Al Pimple.     Total encounter time:30 minutes*in face-to-face visit time, chart review, lab review, care coordination, order entry, and documentation of the encounter time.  [name] was present for entirety of visit.    Lillard Anes, NP 01/31/23 1:08 PM Medical Oncology and Hematology [facility] [address] [place], [place] [address] Tel. [phone]    Fax. [phone]  *Total Encounter Time as defined by the Centers for Medicare and Medicaid Services includes, in addition to the face-to-face time of a patient visit (documented in the note above) non-face-to-face time: obtaining and reviewing outside history, ordering and reviewing medications, tests or procedures, care coordination (communications with other health care professionals or caregivers) and documentation in the medical  record.  This note was electronically signed. Noreene Filbert, NP 01/31/2023

## 2023-01-31 NOTE — Assessment & Plan Note (Signed)
Shauniece is a 47 year old woman accompanied by Spanish interpreter [name] here today for follow-up while taking capecitabine twice daily.  I am concerned about her new discomfort in her chest with the breathing changes and the abdominal discomfort.  I have placed orders for repeat CT chest abdomen and pelvis to further evaluate.  She has a contrast allergy and I sent steroid and Benadryl prep into her pharmacy.  I reviewed with her how to take it in detail.  She will continue on Capecitabine 1500mg  po BID 14 days on and 7 days off.    I recommended that she get Udder cream or Cetaphil to help with the dryness in her hands which is a side effect from the Capecitabine.   I reminded her the importance of getting in with ophthalmology.  She is going to schedule an appointment today.    [name] will return on 02/21/2023 for labs, f/u with Dr. Al Pimple.

## 2023-02-01 ENCOUNTER — Other Ambulatory Visit: Payer: Self-pay

## 2023-02-03 ENCOUNTER — Ambulatory Visit (INDEPENDENT_AMBULATORY_CARE_PROVIDER_SITE_OTHER): Payer: No Typology Code available for payment source | Admitting: Nurse Practitioner

## 2023-02-03 ENCOUNTER — Encounter: Payer: Self-pay | Admitting: Nurse Practitioner

## 2023-02-03 VITALS — BP 103/61 | HR 83 | Temp 97.3°F | Wt 195.0 lb

## 2023-02-03 DIAGNOSIS — E538 Deficiency of other specified B group vitamins: Secondary | ICD-10-CM

## 2023-02-03 DIAGNOSIS — Z13220 Encounter for screening for lipoid disorders: Secondary | ICD-10-CM

## 2023-02-03 DIAGNOSIS — E559 Vitamin D deficiency, unspecified: Secondary | ICD-10-CM

## 2023-02-03 NOTE — Assessment & Plan Note (Signed)
-   Vitamin B12  2. Vitamin D deficiency  - Vitamin D, 25-hydroxy  3. Lipid screening  - Lipid Panel   Follow up:  Follow up in 6 months

## 2023-02-03 NOTE — Progress Notes (Signed)
@Patient  ID: [name], female    DOB: 21-Mar-1976, 47 y.o.   MRN: [id number]  Chief Complaint  Patient presents with   Follow-up    Over all and vitamin d     Referring provider: Ivonne Andrew, NP   HPI  [name] 47 y.o. female  has a past medical history of Allergy, Asthma, Breast cancer in female [facility]), Breast disorder, Chronic back pain, History of breast cancer, History of COVID-19 (04/20/2021), migraines, and Morbid obesity (HCC).   Patient presents today for follow-up visit.  Plaints today about discoloration, numbness and tingling to her hands.  She was recently seen by oncology and they discussed with her side effect of her treatment medications for breast cancer.  We will check vitamin B12, vitamin D level, and lipid panel today.  Patient admits that she has been noncompliant with cholesterol medications.  She has not taken this in the past 2 weeks. Denies f/c/s, n/v/d, hemoptysis, PND, leg swelling Denies chest pain or edema     Allergies  Allergen Reactions   Omnipaque [Iohexol] Other (See Comments)    Throat itching, some SOB and some chest pain post administration    Doxorubicin Hcl Rash    Experience chest tightness, abdominal pain, and redness around lips during and shortly after doxorubicin bolus.    Keytruda [Pembrolizumab] Other (See Comments)    Lung irritation; skin breakout; swelling   Other Itching    Almonds, cherries, peaches   Tape     Redness    Wound Dressing Adhesive     Immunization History  Administered Date(s) Administered   Influenza,inj,Quad PF,6+ Mos 11/01/2016   Janssen (J&J) SARS-COV-2 Vaccination 01/31/2020   PFIZER(Purple Top)SARS-COV-2 Vaccination 08/19/2020   Unspecified SARS-COV-2 Vaccination 11/25/2020    Past Medical History:  Diagnosis Date   Allergy    Asthma    Breast cancer in female [facility])    Left   Breast disorder    breast cancer May 2021   Chronic back pain    Headache    High  triglycerides    History of breast cancer    History of COVID-19 04/20/2021   Hx of migraines    Morbid obesity (HCC)    Personal history of chemotherapy    Personal history of radiation therapy     Tobacco History: Social History   Tobacco Use  Smoking Status Never  Smokeless Tobacco Never   Counseling given: Not Answered   Outpatient Encounter Medications as of 02/03/2023  Medication Sig   capecitabine (XELODA) 500 MG tablet 1500 mg Q AM and Q PM on days 1-14. Repeat every 21 days.   lidocaine (XYLOCAINE) 2 % solution Use as directed 15 mLs in the mouth or throat every 6 (six) hours as needed for mouth pain.   meloxicam (MOBIC) 7.5 MG tablet Take 1 tablet (7.5 mg total) by mouth daily.   omeprazole (PRILOSEC) 40 MG capsule Take 1 capsule (40 mg total) by mouth daily.   ondansetron (ZOFRAN) 8 MG tablet Take 1 tablet (8 mg total) by mouth every 8 (eight) hours as needed for nausea or vomiting.   predniSONE (DELTASONE) 50 MG tablet Take 1 tablet by mouth 13 hours before CT, 7 hours before CT and 1 hour before CT   prochlorperazine (COMPAZINE) 10 MG tablet Take 1 tablet (10 mg total) by mouth every 6 (six) hours as needed for nausea or vomiting.   simvastatin (ZOCOR) 20 MG tablet Take 1 tablet (20  mg total) by mouth every evening.   cycloSPORINE (RESTASIS) 0.05 % ophthalmic emulsion Place 1 drop into both eyes 2 (two) times daily. (Patient not taking: Reported on 02/03/2023)   diphenhydrAMINE (BENADRYL) 25 MG tablet Take 2 tablets (50 mg total) by mouth 1 hour before CT scan (Patient not taking: Reported on 02/03/2023)   Vitamin D, Ergocalciferol, (DRISDOL) 1.25 MG (50000 UNIT) CAPS capsule Take 1 capsule (50,000 Units total) by mouth every 7 (seven) days. (Patient not taking: Reported on 02/03/2023)   No facility-administered encounter medications on file as of 02/03/2023.     Review of Systems  Review of Systems  Constitutional: Negative.   HENT: Negative.    Cardiovascular:  Negative.   Gastrointestinal: Negative.   Allergic/Immunologic: Negative.   Neurological: Negative.   Psychiatric/Behavioral: Negative.         Physical Exam  BP 103/61   Pulse 83   Temp (!) 97.3 F (36.3 C)   Wt 195 lb (88.5 kg)   LMP 01/11/2019 (Exact Date)   SpO2 100%   BMI 36.84 kg/m   Wt Readings from Last 5 Encounters:  02/03/23 195 lb (88.5 kg)  01/31/23 195 lb 6.4 oz (88.6 kg)  01/10/23 194 lb 11.2 oz (88.3 kg)  12/23/22 192 lb 11.2 oz (87.4 kg)  11/25/22 192 lb (87.1 kg)     Physical Exam Vitals and nursing note reviewed.  Constitutional:      General: She is not in acute distress.    Appearance: She is well-developed.  Cardiovascular:     Rate and Rhythm: Normal rate and regular rhythm.  Pulmonary:     Effort: Pulmonary effort is normal.     Breath sounds: Normal breath sounds.  Neurological:     Mental Status: She is alert and oriented to person, place, and time.      Lab Results:  CBC    Component Value Date/Time   WBC 3.6 (L) 01/31/2023 1115   RBC 3.90 01/31/2023 1115   HGB 11.6 (L) 01/31/2023 1115   HGB 11.6 (L) 07/20/2022 0856   HGB 13.3 10/01/2021 1004   HCT 33.7 (L) 01/31/2023 1115   HCT 40.4 10/01/2021 1004   PLT 145 (L) 01/31/2023 1115   PLT 220 07/20/2022 0856   PLT 212 10/01/2021 1004   MCV 86.4 01/31/2023 1115   MCV 87 10/01/2021 1004   MCH 29.7 01/31/2023 1115   MCHC 34.4 01/31/2023 1115   RDW 20.4 (H) 01/31/2023 1115   RDW 13.3 10/01/2021 1004   LYMPHSABS 1.1 01/31/2023 1115   LYMPHSABS 1.6 10/01/2021 1004   MONOABS 0.3 01/31/2023 1115   EOSABS 0.1 01/31/2023 1115   EOSABS 0.1 10/01/2021 1004   BASOSABS 0.0 01/31/2023 1115   BASOSABS 0.0 10/01/2021 1004    BMET    Component Value Date/Time   NA 139 01/31/2023 1115   NA 138 09/30/2022 1149   K 3.6 01/31/2023 1115   CL 105 01/31/2023 1115   CO2 25 01/31/2023 1115   GLUCOSE 127 (H) 01/31/2023 1115   BUN 15 01/31/2023 1115   BUN 11 09/30/2022 1149   CREATININE  0.79 01/31/2023 1115   CREATININE 0.78 12/23/2022 0959   CALCIUM 9.2 01/31/2023 1115   GFRNONAA >60 01/31/2023 1115   GFRNONAA >60 12/23/2022 0959   GFRAA >60 06/03/2020 0949   GFRAA >60 02/26/2020 0915    BNP    Component Value Date/Time   BNP 15.2 09/25/2020 1624     Assessment & Plan:   Vitamin B12  deficiency - Vitamin B12  2. Vitamin D deficiency  - Vitamin D, 25-hydroxy  3. Lipid screening  - Lipid Panel   Follow up:  Follow up in 6 months     Ivonne Andrew, NP 02/03/2023

## 2023-02-03 NOTE — Patient Instructions (Addendum)
1. Vitamin B12 deficiency  - Vitamin B12  2. Vitamin D deficiency  - Vitamin D, 25-hydroxy  3. Lipid screening  - Lipid Panel   Follow up:  Follow up in 6 months

## 2023-02-04 LAB — VITAMIN B12: Vitamin B-12: 535 pg/mL (ref 232–1245)

## 2023-02-04 LAB — LIPID PANEL
Chol/HDL Ratio: 3.8 ratio (ref 0.0–4.4)
Cholesterol, Total: 156 mg/dL (ref 100–199)
HDL: 41 mg/dL (ref >39)
LDL Chol Calc (NIH): 79 mg/dL (ref 0–99)
Triglycerides: 216 mg/dL — ABNORMAL HIGH (ref 0–149)
VLDL Cholesterol Cal: 36 mg/dL (ref 5–40)

## 2023-02-04 LAB — VITAMIN D 25 HYDROXY (VIT D DEFICIENCY, FRACTURES): Vit D, 25-Hydroxy: 24.5 ng/mL — ABNORMAL LOW (ref 30.0–100.0)

## 2023-02-07 ENCOUNTER — Ambulatory Visit: Payer: No Typology Code available for payment source | Admitting: Physical Therapy

## 2023-02-07 ENCOUNTER — Other Ambulatory Visit (HOSPITAL_COMMUNITY): Payer: Self-pay

## 2023-02-07 ENCOUNTER — Encounter: Payer: Self-pay | Admitting: Physical Therapy

## 2023-02-07 ENCOUNTER — Encounter: Payer: Self-pay | Admitting: Hematology and Oncology

## 2023-02-07 DIAGNOSIS — M25612 Stiffness of left shoulder, not elsewhere classified: Secondary | ICD-10-CM

## 2023-02-07 DIAGNOSIS — Z483 Aftercare following surgery for neoplasm: Secondary | ICD-10-CM

## 2023-02-07 DIAGNOSIS — I972 Postmastectomy lymphedema syndrome: Secondary | ICD-10-CM

## 2023-02-07 DIAGNOSIS — R293 Abnormal posture: Secondary | ICD-10-CM

## 2023-02-07 NOTE — Therapy (Signed)
OUTPATIENT PHYSICAL THERAPY ONCOLOGY TREATMENT  Patient Name: [name] MRN: [id number] DOB:1975-12-14, 47 y.o., female Today's Date: 02/07/2023  END OF SESSION:  PT End of Session - 02/07/23 1010     Visit Number 26                       Past Medical History:  Diagnosis Date   Allergy    Asthma    Breast cancer in female [facility])    Left   Breast disorder    breast cancer May 2021   Chronic back pain    Headache    High triglycerides    History of breast cancer    History of COVID-19 04/20/2021   Hx of migraines    Morbid obesity (HCC)    Personal history of chemotherapy    Personal history of radiation therapy    Past Surgical History:  Procedure Laterality Date   BREAST LUMPECTOMY     BREAST LUMPECTOMY WITH RADIOACTIVE SEED AND SENTINEL LYMPH NODE BIOPSY Left 07/15/2020   Procedure: LEFT BREAST LUMPECTOMY WITH RADIOACTIVE SEED AND LEFT AXILLARY SENTINEL LYMPH NODE BIOPSY, LEFT AXILLARY NODE RADIOACTIVE SEED GUIDED EXCISION, LEFT BREAST RADIOACTIVE SEED GUIDED EXCISION IM NODE;  Surgeon: Emelia Loron, MD;  Location: [facility];  Service: General;  Laterality: Left;  PEC BLOCK   CESAREAN SECTION WITH BILATERAL TUBAL LIGATION Bilateral 07/23/2015   Procedure: CESAREAN SECTION WITH BILATERAL TUBAL LIGATION;  Surgeon: Reva Bores, MD;  Location: [facility];  Service: Obstetrics;  Laterality: Bilateral;   IR IMAGING GUIDED PORT INSERTION  02/11/2022   MODIFIED MASTECTOMY Left 08/22/2022   Procedure: LEFT MODIFIED RADICAL MASTECTOMY, LEFT AXILLARY NODE DISSECTION, AND SKIN BIOPSY OF BACK;  Surgeon: Emelia Loron, MD;  Location: [facility];  Service: General;  Laterality: Left;   PORT-A-CATH REMOVAL N/A 04/30/2021   Procedure: REMOVAL PORT-A-CATH;  Surgeon: Emelia Loron, MD;  Location: [facility];  Service: General;  Laterality: N/A;   PORTACATH PLACEMENT Right 02/11/2020   Procedure: INSERTION PORT-A-CATH WITH ULTRASOUND GUIDANCE;   Surgeon: Emelia Loron, MD;  Location: [facility];  Service: General;  Laterality: Right;   TUBAL LIGATION     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) 04/01/2022   Encounter for dental examination 03/11/2022   Acute apical periodontitis 03/11/2022   Phobia of dental procedure 03/11/2022   Defective dental restoration 03/11/2022   Accretions on teeth 03/11/2022   Chronic periodontitis 03/11/2022   Teeth missing 03/11/2022   Caries 03/11/2022   Generalized gingival recession 03/11/2022   Pain, dental 03/08/2022   Cancer-related pain 03/08/2022   Port-A-Cath in place 02/15/2022   Oral allergy syndrome, subsequent encounter 02/03/2022   Adverse effect of other drugs, medicaments and biological substances, subsequent encounter 02/02/2022   Other adverse food reactions, not elsewhere classified, subsequent encounter 02/02/2022   Chest tightness 02/02/2022   Other allergic rhinitis 02/02/2022   Allergic conjunctivitis of both eyes 02/02/2022   Language barrier 12/11/2020   Morbid obesity (HCC)    History of breast cancer    Genetic testing 02/13/2020   Malignant neoplasm of upper-outer quadrant of left breast in female, estrogen receptor positive (HCC) 01/27/2020  Impaired ability to use community resources due to language barrier 07/13/2013    PCP: Angus Seller, NP  REFERRING PROVIDER: Rachel Moulds, MD  REFERRING DIAG: Left UE lymphedema  THERAPY DIAG:  Postmastectomy lymphedema  Stiffness of left shoulder, not elsewhere classified  Aftercare following surgery for neoplasm  Abnormal posture  ONSET DATE: 08/01/22  Rationale for Evaluation and Treatment:  Rehabilitation  SUBJECTIVE:                                                                                                                                                                                           SUBJECTIVE STATEMENT:  I received the paperwork again for the compression pump that I had already filled out. My hands and feet are cracking and peeling. The 2nd toe on the L foot is so pain and the nail is turning dark. The back pain is still there. The exercises did not seem to help. I was going to get back injections and then I got the cancer diagnosis so that was put on hold. I will follow up with him once I complete my treatments.   PERTINENT HISTORY:  Patient was diagnosed on 01/23/2020 with left grade III invasive ductal carcinoma breast cancer. It is ER positive, PR negative, and HER2 negative , but functionally Triple Negative,with a Ki67 of 40%. Patient with interpreter present. She underwent neoadjuvant chemotherapy from 02/12/2020 - 05/28/2020. 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 She underwent a left lumpectomy and sentinel node biopsy ( 4 negative nodes) on 07/15/2020. She underwent radiation and anti-estrogen therapy.  She had interval development of diffuse skin thickening in 2023. She had a punch biopsy of her skin at that time and this was consistent with inflammatory breast cancer. She has been treated with chemo since then. She had a left modified radical mastectomy on 08/22/2022 with complete therapeutic response and 0/1 LN. She noted some swelling in her left arm and on 08/25/2022 she had an [place] that was negative for DVT. Her SOZO screen on 09/19/2021 was 6.9 points above baseline putting her in the yellow zone and she was given a compression sleeve that was not containing  her so a flat knit sleeve and glove and nighttime garment  have been ordered   PAIN:  Are you having pain? Pain in 2nd digit of L foot, currently 5/10, nothing makes it better or  worse  PRECAUTIONS: Other: lymphedema [name]  WEIGHT BEARING RESTRICTIONS: No  FALLS:  Has patient fallen in last 6 months? 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 Fullness in left lateral chest and back as well as left arm and hand especially on thumb side   OBSERVATIONS / OTHER ASSESSMENTS: scar well healed, increased edema at lateral trunk and throughout [name] reddened area in radiation field with skin appearing fragile and irritated but no open areas   POSTURE: forward head, rounded shoulders  UPPER EXTREMITY AROM/PROM:  A/PROM RIGHT   eval   Shoulder extension 68  Shoulder flexion 160  Shoulder abduction 175  Shoulder internal rotation 56  Shoulder external rotation 88    (Blank rows = not tested)  A/PROM LEFT   eval LEFT 11/10/22 LEFT 12/06/22 LEFT 12/08/2022 LEFT 12/19/2022 LEFT 01/23/23 LEFT 02/07/23  Shoulder extension 52        Shoulder flexion 146 150 156 155 165 162 162  Shoulder abduction 148 160 164 170 170 172 175  Shoulder internal rotation 67        Shoulder external rotation 86          (Blank rows = not tested)   LYMPHEDEMA ASSESSMENTS:  SURGERY TYPE/DATE: Left Lumpectomy with SLNB11/11/2019, Left modified mastectomy with left axillary node dissection NUMBER OF LYMPH NODES REMOVED: 0/4 with lumpectomy, 0/1 during  Mastectomy CHEMOTHERAPY: yes RADIATION:Yes HORMONE TREATMENT: may require INFECTIONS: none  LYMPHEDEMA ASSESSMENTS:  LANDMARK RIGHT  eval RIGHT 11/08/22  10 cm proximal to olecranon process 31.4 34.4  Olecranon process 27.2 28  10  cm proximal to ulnar styloid process 25.7 25.5  Just proximal to ulnar styloid process 18.4 19  Across hand at thumb web space 20 20  At base of 2nd digit 6.4 6.5  (Blank rows = not tested)  LANDMARK LEFT  eval LEFT 10/25/22 Left  11/03/22 LEFT 11/08/22 LEFT  12/08/2022  LEFT 02/07/23  10 cm proximal to olecranon process 33 33 31.9 33 34.3 33  Olecranon process 28.4 28.5 28.5 29 28.7 28.1  10 cm proximal to ulnar styloid process 26 26.5 26.4 26.6 26.5 26  Just proximal to ulnar styloid process 19.4 19 18.9 19.1 18.2 18.5  Across hand at thumb web space 21 20.5 19.7 20.7 19.5 20.4  2nd digit    6.7 6.5 6.4   TODAY'S TREATMENT:   02/07/23:  Remeasured ROM and circumferences Assessed progress towards goals Sent email to [facility] regarding financial application which pt filled out and therapist faxed and got confirmation that it was received but she just received it again Discussed pt's progress towards goals and answered pt's questions about lingering numbness/discomfort in axillary region - educated this is normal post surgery Encouraged pt to follow up with foot doctor regarding pain in toe  Discussed pt following up with ortho once she finishes chemo since current back exercsies have not helped  01/23/23 Remeasured ROM Pelvic tilts with 5 sec holds x 10 reps Pelvic tilts with gentle alternating hip abduction in hook lying x 10 reps Pelvic tilts with alternating marching in hook lying x 10 reps Standing with back  against wall with core engaged and shoulders back -3 way shoulder with 1 lb weight with pt reporting difficulty with this and back pain Educated pt about why exercising can help back pain and how tight pecs and weak back muscles causing increased  back pain and the role of core muscles in helping support the low back Supine horizontal abduction with yellow band x 10 reps Supine overhead flexion with yellow band x 10 reps No back pain by end of session  01/13/23: Discussed how current glove is not managing her swelling. Discussed her current schedule for wearing her night time and day time garment. When pt takes off her night time garment she does her morning routine prior to donning day time garment. Educated pt to immediately don day time garment. Pt reports it is difficult to get ready, get her kids ready and cook breakfast without soiling glove so issued her disposable gloves to don over her glove. Also, her current glove is too loose and there is fabric at back of glove that can be pinched. Emailed [employer] about a Therapist, music. Educated pt that she can still bandage if she feels her hand is swelling. Began AAROM exercises today: Pulleys x 2 min in direction of flexion and abduction with v/c to decrease scapular compensation Ball up wall x 10 reps in direction of flexion and abduction with pt returning therapist demo - pt reported back pain with this so educated pt on importance of posture and need for core strength to protect back Pelvic tilts with max v/c and t/c initially and therapist demo with pt able to return demo by end of session - issued this as part of HEP  01/03/23: Demonstrated correct way to don/doff compression sleeve and glove. Had pt return demonstrate. Sleeve fit very well. Educated pt on care instructions and educated her to wear her sleeve during the day and apply compression bandages at night.   12/28/2022  MLD: Short neck, L 5 diaphragmatic breaths, bil axillary nodes and establishment of interaxillary pathway, Lt inguinal nodes, Lt axillo-inguinal anastomosis spending increased time in area of fullness inferior to axilla, L UE working proximal to distal, then retracing all steps.  Educated pt about availability of compression  tank tops and educated her that this may help since her compression bra has not been helping lateral trunk edema and digs in right below her swelling Compression Bandaging: TG soft from hand to axilla,  mollelast to digits 1-4 with  , artiflex from hand to axilla, 1 6 cm bandage at hand, 1 8 cm bandage from wrist to axilla with x at antecubital fossa and 1 12 cm bandage from wrist to axilla   12/21/2022: Rep from [employer] in clinic today. Arrangements were made for rep to come to our clinic at 11:00 on 01/03/2023 to do the demonstration for the [employer] and fill out the needed paperwork to see if pt qualifies for a donated garment. Also checked on the sleeve as the [employer] portal says that they are waiting for payment and pt given information to contact them about it.  Pt askes for MLD only as she cannot do that at home so it was performed as on 12/19/2022 with extra time and stretch to anterior chest and MLD at fullness at anterior and posterior axilla.    12/19/2022  MLD: Short neck, L 5 diaphragmatic breaths, bil axillary nodes and establishment of interaxillary pathway, Lt inguinal nodes, Lt axillo-inguinal anastomosis L UE working proximal to distal especially at thumb side of  hand with arm elevated. then retracing all steps. Then to sidelying for more prolonged work on posterior interaxillary anastamosis with gentle pressure from one hand on lateral chest and axilla. In sidelying, small circles with hand pointed to ceiling, shoulder flexion and external rotation. X 10  Talked with pt about [employer] but she says she has not heard from them yet, but may have not picked up their call.  Gave pt T[employer] card and asked to call them to check on it herself ( with assist from English speaking family) Reinforced scar massage and skin stretch at anterior chest since she is healed from radiation   12/15/2022  MLD: Short neck, L 5 diaphragmatic breaths, bil axillary nodes and establishment of interaxillary pathway,  Lt inguinal nodes, Lt axillo-inguinal anastomosis avoiding radiation field tenderness, L UE working proximal to distal, then retracing all steps. Then to sidelying for more prolonged work on Merchandiser, retail. supine dowel stretch in flexion and abduction In sidelying, small circles with hand pointed to ceiling,x 5  short range abduction abduction with straight arm, shoulder flexion and external rotation. X 10  Compression Bandaging: TG soft from hand to axilla,  mollelast to digits 1-5 with  , gray foam over dorsum of hand and wrist, artiflex from hand to axilla, 1 6 cm bandage at hand, 1 8 cm bandage from wrist to axilla with x at antecubital fossa and 1 12 cm bandage from wrist to axilla   12/06/2022 MLD: Short neck, L 5 diaphragmatic breaths, R axillary nodes and establishment of interaxillary pathway, Lt inguinal nodes, Lt axillo-inguinal anastomosis avoiding radiation field, L UE working proximal to distal, then retracing all steps.  PROM to L shoulder gentle in to flexion and abduction making sure to avoid any pulling at fragile skin Compression Bandaging: TG soft from hand to axilla,  mollelast to digits 1-4 with  , artiflex from hand to axilla, 1 6 cm bandage at hand, 1 8 cm bandage from wrist to axilla in herringbone fashion and 1 12 cm bandage from wrist to axilla   12/01/2022 MLD: Short neck, L 5 diaphragmatic breaths, R axillary nodes and establishment of interaxillary pathway, Lt inguinal nodes, Lt axillo-inguinal anastomosis avoiding radiation field, L UE working proximal to distal, then to R s/l to work on L lateral trunk moving fluid towards pathways then back to supine retracing all steps.  PROM to L shoulder gentle in to flexion and abduction making sure to avoid any pulling at fragile skin  11/29/2022 MLD: Short neck, L 5 diaphragmatic breaths, R axillary nodes and establishment of interaxillary pathway, Lt inguinal nodes, Lt axillo-inguinal anastomosis avoiding  radiation field, L UE working proximal to distal, then to R s/l to work on L lateral trunk moving fluid towards pathways then back to supine retracing all steps.  PROM to L shoulder gentle in to flexion and abduction making sure to avoid any pulling at fragile skin Compression Bandaging: TG soft from hand to axilla,  mollelast to digits 1-5 with  , gray foam over dorsum of hand and wrist, artiflex from hand to axilla, 1 6 cm bandage at hand, 1 8 cm bandage from wrist to axilla with x at antecubital fossa and 1 12 cm bandage from wrist to axilla    11/24/2022 MLD: Short neck, Lt inguinal nodes, Lt axillo-inguinal anastomosis, avoiding radiation field, L UE working proximal to distal then retracing all steps. Did not work on lateral trunk due to compromised skin from radiation Compression Bandaging: TG soft from  hand to axilla,  mollelast to digits 1-5 with  , gray foam over dorsum of hand and wrist, artiflex from hand to axilla, 1 6 cm bandage at hand, 1 8 cm bandage from wrist to axilla with x at antecubital fossa and 1 12 cm bandage from wrist to axilla                                                                                                                                  PATIENT EDUCATION:  Education details:how weak/overstretched back muscles with tight pecs can cause back pain, how weak core can cause back pain Person educated: Patient Education method: Actor given  Education comprehension: verbalized understanding  HOME EXERCISE PROGRAM: Obtain compression bra, continue to wear sleeve and glove Pelvic tilts x 10 reps each with 5 sec holds  Add hip abduction with pelvic tilt  Add marching with pelvic tilt Supine overhead flexion and horizontal abduction with yellow band  ASSESSMENT:  CLINICAL IMPRESSION: Assessed pt's progress towards goals in therapy. She has met all goals except for compression garment goal because she is awaiting a remake of her custom flat knit  glove. The original was too large. Her circumferences are decreased throughout her [name] except at dorsum of hand which has increased due to ill fitting glove. She has met all of her ROM goals. Pt will be placed on hold for a month so we can assess her new flat knit glove for fit once she receives it and will plan to d/c at the next session.   OBJECTIVE IMPAIRMENTS: decreased knowledge of condition, decreased ROM, decreased strength, increased edema, increased fascial restrictions, impaired UE functional use, postural dysfunction, and pain.   ACTIVITY LIMITATIONS: carrying, lifting, and reach over head  PARTICIPATION LIMITATIONS: cleaning, laundry, community activity, occupation, and yard work  PERSONAL FACTORS: Time since onset of injury/illness/exacerbation are also affecting patient's functional outcome.   REHAB POTENTIAL: Good  CLINICAL DECISION MAKING: Stable/uncomplicated  EVALUATION COMPLEXITY: Low  GOALS: Goals reviewed with patient? Yes   SHORT TERM GOALS = LONG TERM GOALS: Target date: 11/09/22  Pt will be independent in self MLD for long term management of lymphedema.  Baseline:  Goal status: MET 02/07/23 pt is independent with this   2.  Pt will obtain appropriate compression garments for long term management of lymphedema.  Baseline:  Goal status: IN PROGRESS 02/07/23- pt has obtained a flat knit sleeve and glove  and night time garment - she is awaiting remake of glove which was too large  3.  Pt will demonstrate 160 degrees of L shoulder flexion to allow her to reach overhead. Baseline:  Goal status: MET 02/07/23: 162; 11/09/2022:  155  4. Pt will demonstrate 170 degrees of L shoulder abduction to allow her to reach out to the side.   Goal:MET 02/07/23: 175,  12/08/22 - 164  5. Pt will be independent in a home exercise program for continued  stretching and strengthening.  Goal: MET 02/07/23  6. Pt will report she is able to raise her L arm to end range wihtout increased  pain in her axilla to allow improved comfort.   GOAL: MET 02/07/23- pt reports there is no pain but still numbness, educated pt that numbness is normal  PLAN:  PT FREQUENCY:1- 2x/week  PT DURATION: 8 weeks   PLANNED INTERVENTIONS: Therapeutic exercises, Therapeutic activity, Patient/Family education, Self Care, Joint mobilization, Manual lymph drainage, Compression bandaging, scar mobilization, Taping, Vasopneumatic device, and Manual therapy  PLAN FOR NEXT SESSION recheck remake of glove and follow up on Flexi in 1 month   Leonette Most, PT 02/07/2023, 10:10 AM [facility] [address] [place], [place], [address] Phone: [phone]   Fax:  [phone]

## 2023-02-09 ENCOUNTER — Other Ambulatory Visit: Payer: Self-pay

## 2023-02-13 ENCOUNTER — Encounter: Payer: No Typology Code available for payment source | Admitting: Physical Therapy

## 2023-02-14 ENCOUNTER — Ambulatory Visit (HOSPITAL_COMMUNITY)
Admission: RE | Admit: 2023-02-14 | Discharge: 2023-02-14 | Disposition: A | Discharge status: Home / Self Care | Payer: No Typology Code available for payment source | Source: Ambulatory Visit | Attending: Adult Health | Admitting: Adult Health

## 2023-02-14 DIAGNOSIS — Z170 Estrogen receptor positive status [ER+]: Secondary | ICD-10-CM

## 2023-02-14 DIAGNOSIS — C50412 Malignant neoplasm of upper-outer quadrant of left female breast: Secondary | ICD-10-CM | POA: Insufficient documentation

## 2023-02-14 MED ORDER — IOHEXOL 300 MG/ML  SOLN
100.0000 mL | Freq: Once | INTRAMUSCULAR | Status: AC | PRN
  Administered 2023-02-14: 100 mL via INTRAVENOUS

## 2023-02-14 MED ORDER — SODIUM CHLORIDE (PF) 0.9 % IJ SOLN
INTRAMUSCULAR | Status: AC
  Filled 2023-02-14: qty 50

## 2023-02-17 ENCOUNTER — Encounter: Payer: Self-pay | Admitting: Licensed Clinical Social Worker

## 2023-02-17 NOTE — Progress Notes (Signed)
UPDATE: MSH6 [id number] ([id number]) VUS has been amended to Benign. Amended report date is 02/13/2023.

## 2023-02-21 ENCOUNTER — Inpatient Hospital Stay: Payer: No Typology Code available for payment source

## 2023-02-22 ENCOUNTER — Other Ambulatory Visit (HOSPITAL_COMMUNITY): Payer: Self-pay

## 2023-02-24 ENCOUNTER — Other Ambulatory Visit (HOSPITAL_COMMUNITY): Payer: Self-pay

## 2023-02-27 ENCOUNTER — Other Ambulatory Visit: Payer: Self-pay

## 2023-02-27 ENCOUNTER — Other Ambulatory Visit (HOSPITAL_COMMUNITY): Payer: Self-pay

## 2023-02-27 ENCOUNTER — Encounter: Payer: Self-pay | Admitting: Hematology and Oncology

## 2023-02-28 ENCOUNTER — Other Ambulatory Visit: Payer: Self-pay

## 2023-02-28 ENCOUNTER — Inpatient Hospital Stay: Payer: No Typology Code available for payment source | Attending: Hematology and Oncology

## 2023-02-28 ENCOUNTER — Inpatient Hospital Stay (HOSPITAL_BASED_OUTPATIENT_CLINIC_OR_DEPARTMENT_OTHER): Payer: No Typology Code available for payment source | Admitting: Hematology and Oncology

## 2023-02-28 VITALS — BP 112/75 | HR 79 | Temp 98.4°F | Resp 16 | Wt 194.1 lb

## 2023-02-28 DIAGNOSIS — Z170 Estrogen receptor positive status [ER+]: Secondary | ICD-10-CM

## 2023-02-28 DIAGNOSIS — Z923 Personal history of irradiation: Secondary | ICD-10-CM | POA: Insufficient documentation

## 2023-02-28 DIAGNOSIS — C50412 Malignant neoplasm of upper-outer quadrant of left female breast: Secondary | ICD-10-CM | POA: Insufficient documentation

## 2023-02-28 DIAGNOSIS — Z95828 Presence of other vascular implants and grafts: Secondary | ICD-10-CM

## 2023-02-28 DIAGNOSIS — Z8616 Personal history of COVID-19: Secondary | ICD-10-CM | POA: Insufficient documentation

## 2023-02-28 DIAGNOSIS — Z171 Estrogen receptor negative status [ER-]: Secondary | ICD-10-CM | POA: Insufficient documentation

## 2023-02-28 DIAGNOSIS — E559 Vitamin D deficiency, unspecified: Secondary | ICD-10-CM | POA: Insufficient documentation

## 2023-02-28 DIAGNOSIS — E538 Deficiency of other specified B group vitamins: Secondary | ICD-10-CM | POA: Insufficient documentation

## 2023-02-28 DIAGNOSIS — Z9221 Personal history of antineoplastic chemotherapy: Secondary | ICD-10-CM | POA: Insufficient documentation

## 2023-02-28 DIAGNOSIS — R109 Unspecified abdominal pain: Secondary | ICD-10-CM | POA: Insufficient documentation

## 2023-02-28 LAB — COMPREHENSIVE METABOLIC PANEL
ALT: 13 U/L (ref 0–44)
AST: 17 U/L (ref 15–41)
Albumin: 4 g/dL (ref 3.5–5.0)
Alkaline Phosphatase: 114 U/L (ref 38–126)
Anion gap: 5 (ref 5–15)
BUN: 11 mg/dL (ref 6–20)
CO2: 28 mmol/L (ref 22–32)
Calcium: 9.7 mg/dL (ref 8.9–10.3)
Chloride: 105 mmol/L (ref 98–111)
Creatinine, Ser: 0.67 mg/dL (ref 0.44–1.00)
GFR, Estimated: >60 mL/min (ref >60)
Glucose, Bld: 98 mg/dL (ref 70–99)
Potassium: 3.9 mmol/L (ref 3.5–5.1)
Sodium: 138 mmol/L (ref 135–145)
Total Bilirubin: 0.6 mg/dL (ref 0.3–1.2)
Total Protein: 6.5 g/dL (ref 6.5–8.1)

## 2023-02-28 LAB — CBC WITH DIFFERENTIAL/PLATELET
Abs Immature Granulocytes: 0.08 10*3/uL — ABNORMAL HIGH (ref 0.00–0.07)
Basophils Absolute: 0 10*3/uL (ref 0.0–0.1)
Basophils Relative: 0 %
Eosinophils Absolute: 0.1 10*3/uL (ref 0.0–0.5)
Eosinophils Relative: 3 %
HCT: 33.3 % — ABNORMAL LOW (ref 36.0–46.0)
Hemoglobin: 11.4 g/dL — ABNORMAL LOW (ref 12.0–15.0)
Immature Granulocytes: 2 %
Lymphocytes Relative: 24 %
Lymphs Abs: 1.1 10*3/uL (ref 0.7–4.0)
MCH: 32.1 pg (ref 26.0–34.0)
MCHC: 34.2 g/dL (ref 30.0–36.0)
MCV: 93.8 fL (ref 80.0–100.0)
Monocytes Absolute: 0.5 10*3/uL (ref 0.1–1.0)
Monocytes Relative: 10 %
Neutro Abs: 3 10*3/uL (ref 1.7–7.7)
Neutrophils Relative %: 61 %
Platelets: 162 10*3/uL (ref 150–400)
RBC: 3.55 MIL/uL — ABNORMAL LOW (ref 3.87–5.11)
RDW: 21.8 % — ABNORMAL HIGH (ref 11.5–15.5)
WBC: 4.8 10*3/uL (ref 4.0–10.5)
nRBC: 0 % (ref 0.0–0.2)

## 2023-02-28 MED ORDER — SODIUM CHLORIDE 0.9% FLUSH
10.0000 mL | Freq: Once | INTRAVENOUS | Status: AC
  Administered 2023-02-28: 10 mL

## 2023-02-28 MED ORDER — HEPARIN SOD (PORK) LOCK FLUSH 100 UNIT/ML IV SOLN
500.0000 [IU] | Freq: Once | INTRAVENOUS | Status: AC
  Administered 2023-02-28: 500 [IU]

## 2023-02-28 NOTE — Assessment & Plan Note (Signed)
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  She was most recently seen for ongoing breast redness, given a course of antibiotics but had persistent redness hence we proceeded with a punch biopsy.  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  Pathology was sent to [facility] for second opinion as well. This confirmed triple negative breast cancer.  She progressed on after 2 cycles of TC hence we have switched her treatment to Enhertu.   Repeat biopsy demonstrated her HER2 to be 1+.  Given locally advanced and unresectable breast cancer and the progression on TC and previous exposure to Adriamycin based regimen, we decided to try Enhertu for HER2 low disease and she had robust response.  She is now status post left mastectomy and axillary resection and there is no evidence of residual tumor, complete pathologic response obtained.  Since this is a recurrent breast cancer and high risk for systemic disease, I have discussed about adjuvant Xeloda based on the create X trial I will start her on adjuvant Xeloda after completion of radiation.  Once again we discussed the excellent response achieved so far, role of antiestrogen therapy like tamoxifen as well as adjuvant chemotherapy leg Xeloda. She completed adjuvant radiation.  She is now on adjuvant Xeloda at 1500 mg p.o. twice daily.  She is tolerating this dose pretty well.  She continues to have intermittent abdominal cramps but no nausea, vomiting, diarrhea or significant hand-foot syndrome.  She has noticed some hypopigmentation overall.  No changes on exam concerning for breast cancer recurrence.  Most recent exam with no concerns for residual or recurrent disease.  We have discussed about weight reduction given hepatic steatosis reported on the CT scan.  She will continue with Xeloda for another  4 cycles.  She will return to clinic in 3 weeks or sooner as needed.  CBC reviewed and without any concerns.  CMP pending at the time of my visit.

## 2023-02-28 NOTE — Progress Notes (Signed)
[facility] Cancer Follow up:    [name] [address] [place] [address]   DIAGNOSIS:  Cancer Staging  Malignant neoplasm of upper-outer quadrant of left breast in female, estrogen receptor positive (HCC) Staging form: Breast, AJCC 8th Edition - Clinical stage from 01/29/2020: Stage IIIA (cT2, cN1, cM0, G3, ER+, PR-, HER2-) - Signed by Monica Moulds, MD on 06/28/2022 Stage prefix: Initial diagnosis Histologic grading system: 3 grade system   SUMMARY OF ONCOLOGIC HISTORY: Oncology History  Malignant neoplasm of upper-outer quadrant of left breast in female, estrogen receptor positive (HCC)  01/23/2020 Initial Diagnosis   [name] woman status post left breast upper outer quadrant biopsy 01/23/2020 for a clinical T3 N1, 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             (a) chest CT scan and bone scan 02/07/2020 showed no evidence of metastatic disease   01/29/2020 Cancer Staging   Staging form: Breast, AJCC 8th Edition - Clinical stage from 01/29/2020: Stage IIIA (cT2, cN1, cM0, G3, ER+, PR-, HER2-) - Signed by Monica Moulds, MD on 06/28/2022 Stage prefix: Initial diagnosis Histologic grading system: 3 grade system   02/07/2020 Genetic Testing   Negative genetic testing. No pathogenic variants identified on the [facility] Common Hereditary Cancers Panel. VUS in MSH6 called c.2744C>G identified. The report date is 02/07/2020.  The Common Hereditary Cancers Panel offered by [facility] includes sequencing and/or deletion duplication testing of the following 48 genes: APC, ATM, AXIN2, BARD1, BMPR1A, BRCA1, BRCA2, BRIP1, CDH1, CDKN2A (p14ARF), CDKN2A (p16INK4a), CKD4, CHEK2, CTNNA1, DICER1, EPCAM (Deletion/duplication testing only), GREM1 (promoter region deletion/duplication testing only), KIT, MEN1, MLH1, MSH2, MSH3, MSH6, MUTYH, NBN, NF1, NHTL1, PALB2, PDGFRA, PMS2, POLD1, POLE, PTEN, RAD50,  RAD51C, RAD51D, RNF43, SDHB, SDHC, SDHD, SMAD4, SMARCA4. STK11, TP53, TSC1, TSC2, and VHL.  The following genes were evaluated for sequence changes only: SDHA and HOXB13 c.251G>A variant only.  UPDATE: MSH6 c.2744C>G (p.Ala915Gly) VUS has been amended to Benign. Amended report date is 02/13/2023.   02/12/2020 - 05/28/2020 Neo-Adjuvant Chemotherapy   neoadjuvant chemotherapy consisting of doxorubicin and cyclophosphamide in dose dense fashion x4 started 02/12/2020, completed 03/25/2020, followed by paclitaxel and carboplatin weekly x12 started 04/08/2020 and completed on 05/28/2020             (a) echo 02/07/2020 shows an ejection fraction in the 55-60% range             (b) Epirubicin substituted for Doxorubicin starting with cycle 2 of AC secondary to allergic rash from Doxorubicin   07/15/2020 Surgery   status post left lumpectomy and sentinel lymph node sampling 07/15/2020 for a ypT1b ypN0 residual invasive ductal carcinoma, grade 3, with negative margins.             (a) a total of 4 left axillary lymph nodes were removed             (b) repeat prognostic panel on the final pathology again triple negative, with an MIB-1 of 50%.   08/26/2020 - 10/09/2020 Radiation Therapy   adjuvant radiation completed 10/09/2020             (a) sensitizing capecitabine attempted but not tolerated by the patient  08/26/2020 through 10/09/2020 Site Technique Total Dose (Gy) Dose per Fx (Gy) Completed Fx Beam Energies  Breast, Left: Breast_Lt 3D 50/50 2 25/25 15X  Breast, Left: Breast_Lt_PAB_SCV 3D 50/50 2 25/25 10X, 15X  Breast, Left:  Breast_Lt_Bst 3D 10/10 2 5/5 6X, 10X    11/23/2020 - 01/04/2021 Adjuvant Chemotherapy   adjuvant pembrolizumab started 11/23/2020, to continue for 1 year             (a) discontinued after 01/04/2021 dose with multiple side effects requiring steroids for resolution   03/30/2022 Mammogram   Mammogram and ultrasound show numerous masses throughout left breast not visualized in  01/2022, +left axillary lymphadenopathy, skin thickening in right breast and in upper abdomen just below sternum   04/05/2022 - 07/20/2022 Chemotherapy   Patient is on Treatment Plan : BREAST METASTATIC Fam-Trastuzumab Deruxtecan-nxki (Enhertu) (5.4) q21d     08/21/2022 Surgery   Left breast mastectomy and lymph node biopsy shows no residual carcinoma.     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  Prognostic panel sent to [facility]: Left breast skin, punch biopsy: Skin with dermal lymphatic involvement by carcinoma (confirmed with outside CK7 IHC, reviewed). Biomarkers are reported as negative for ER, PR, Her2/neu (confirmed on review but please note sample size is extremely small).   02/15/2022 - 03/08/2022 Chemotherapy    Taxotere/Cytoxan x 2, discontinued due to progression of breast.   03/30/2022 Mammogram   Mammogram and ultrasound show numerous masses throughout left breast not visualized in 01/2022, +left axillary lymphadenopathy, skin thickening in right breast and in upper abdomen just below sternum   04/05/2022 - 07/20/2022 Chemotherapy   Patient is on Treatment Plan : BREAST METASTATIC Fam-Trastuzumab Deruxtecan-nxki (Enhertu) (5.4) q21d     11/25/2022 -  Chemotherapy   Patient is on Treatment Plan : BREAST Capecitabine q21d       CURRENT THERAPY: capecitabine  INTERVAL HISTORY:  [name] 47 y.o. female returns for f/u of her metasattic breast cancer currently on treatment with Capecitabine, 500mg ; 3 tabs Q12 hours days 1-14 taken every 21 days. She is doing well overall.She has noticed some abdominal cramps even with the lower dose, but they are tolerable. No nausea, vomiting, diarrhea. She reports some intermittent headaches, fatigue, skin hyperpigmentation. No changes in the breast area. No  change in breathing. 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) 04/01/2022   Encounter for dental examination 03/11/2022   Acute apical periodontitis 03/11/2022   Phobia of dental procedure 03/11/2022   Defective dental restoration 03/11/2022   Accretions on teeth 03/11/2022   Chronic periodontitis 03/11/2022   Teeth missing 03/11/2022   Caries 03/11/2022   Generalized gingival recession 03/11/2022   Pain, dental 03/08/2022   Cancer-related pain 03/08/2022   Port-A-Cath in place 02/15/2022   Oral allergy syndrome, subsequent encounter 02/03/2022   Adverse effect of other drugs, medicaments and biological substances, subsequent encounter 02/02/2022   Other adverse food reactions, not elsewhere classified, subsequent encounter 02/02/2022   Chest tightness 02/02/2022   Other allergic rhinitis 02/02/2022   Allergic conjunctivitis of both eyes 02/02/2022   Language barrier 12/11/2020   Morbid obesity (HCC)    History of breast cancer  Genetic testing 02/13/2020   Malignant neoplasm of upper-outer quadrant of left breast in female, estrogen receptor positive (HCC) 01/27/2020   Impaired ability to use community resources due to language barrier 07/13/2013    is allergic to omnipaque [iohexol], doxorubicin hcl, keytruda [pembrolizumab], other, tape, and wound dressing adhesive.  MEDICAL HISTORY: Past Medical History:  Diagnosis Date   Allergy    Asthma    Breast cancer in female [facility])    Left   Breast disorder    breast cancer May 2021   Chronic back pain    Headache    High triglycerides    History of breast cancer    History of COVID-19  04/20/2021   Hx of migraines    Morbid obesity (HCC)    Personal history of chemotherapy    Personal history of radiation therapy     SURGICAL HISTORY: Past Surgical History:  Procedure Laterality Date   BREAST LUMPECTOMY     BREAST LUMPECTOMY WITH RADIOACTIVE SEED AND SENTINEL LYMPH NODE BIOPSY Left 07/15/2020   Procedure: LEFT BREAST LUMPECTOMY WITH RADIOACTIVE SEED AND LEFT AXILLARY SENTINEL LYMPH NODE BIOPSY, LEFT AXILLARY NODE RADIOACTIVE SEED GUIDED EXCISION, LEFT BREAST RADIOACTIVE SEED GUIDED EXCISION IM NODE;  Surgeon: Emelia Loron, MD;  Location: [facility] OR;  Service: General;  Laterality: Left;  PEC BLOCK   CESAREAN SECTION WITH BILATERAL TUBAL LIGATION Bilateral 07/23/2015   Procedure: CESAREAN SECTION WITH BILATERAL TUBAL LIGATION;  Surgeon: Reva Bores, MD;  Location: [facility]S;  Service: Obstetrics;  Laterality: Bilateral;   IR IMAGING GUIDED PORT INSERTION  02/11/2022   MODIFIED MASTECTOMY Left 08/22/2022   Procedure: LEFT MODIFIED RADICAL MASTECTOMY, LEFT AXILLARY NODE DISSECTION, AND SKIN BIOPSY OF BACK;  Surgeon: Emelia Loron, MD;  Location: [facility] OR[facility];  Service: General;  Laterality: Left;   PORT-A-CATH REMOVAL N/A 04/30/2021   Procedure: REMOVAL PORT-A-CATH;  Surgeon: Emelia Loron, MD;  Location: [facility];  Service: General;  Laterality: N/A;   PORTACATH PLACEMENT Right 02/11/2020   Procedure: INSERTION PORT-A-CATH WITH ULTRASOUND GUIDANCE;  Surgeon: Emelia Loron, MD;  Location: [facility];  Service: General;  Laterality: Right;   TUBAL LIGATION      SOCIAL HISTORY: Social History   Socioeconomic History   Marital status: Legally Separated    Spouse name: Not on file   Number of children: 3   Years of education: Not on file   Highest education level: 9th grade  Occupational History   Occupation: Holiday representative  Tobacco Use   Smoking status: Never   Smokeless tobacco: Never  Vaping Use   Vaping Use: Never used   Substance and Sexual Activity   Alcohol use: No   Drug use: Never   Sexual activity: Yes    Birth control/protection: Surgical  Other Topics Concern   Not on file  Social History Narrative   Not on file   Social Determinants of Health   Financial Resource Strain: Not on file  Food Insecurity: No Food Insecurity (10/11/2022)   Hunger Vital Sign    Worried About Running Out of Food in the Last Year: Never true    Ran Out of Food in the Last Year: Never true  Transportation Needs: No Transportation Needs (10/11/2022)   PRAPARE - Administrator, Civil Service (Medical): No    Lack of Transportation (Non-Medical): No  Physical Activity: Not on file  Stress: Not on file  Social Connections: Not on file  Intimate Partner Violence: Not At Risk (10/11/2022)  Humiliation, Afraid, Rape, and Kick questionnaire    Fear of Current or Ex-Partner: No    Emotionally Abused: No    Physically Abused: No    Sexually Abused: No    FAMILY HISTORY: Family History  Problem Relation Age of Onset   Heart disease Maternal Grandmother     Review of Systems  Constitutional:  Negative for appetite change, chills, fatigue, fever and unexpected weight change.  HENT:   Negative for hearing loss, lump/mass and trouble swallowing.   Eyes:  Positive for eye problems. Negative for icterus.  Respiratory:  Negative for chest tightness, cough and shortness of breath.   Cardiovascular:  Negative for chest pain, leg swelling and palpitations.  Gastrointestinal:  Negative for abdominal distention, abdominal pain, constipation, diarrhea, nausea and vomiting.  Endocrine: Negative for hot flashes.  Genitourinary:  Negative for difficulty urinating.   Musculoskeletal:  Negative for arthralgias.  Skin:  Negative for itching and rash.  Neurological:  Negative for dizziness, extremity weakness, headaches and numbness.  Hematological:  Negative for adenopathy. Does not bruise/bleed easily.   Psychiatric/Behavioral:  Negative for depression. The patient is not nervous/anxious.       PHYSICAL EXAMINATION    Vitals:   02/28/23 0922  BP: 112/75  Pulse: 79  Resp: 16  Temp: 98.4 F (36.9 C)  SpO2: 97%    Physical Exam Constitutional:      General: She is not in acute distress.    Appearance: Normal appearance. She is not toxic-appearing.  HENT:     Head: Normocephalic and atraumatic.  Eyes:     General: No scleral icterus. Cardiovascular:     Rate and Rhythm: Normal rate and regular rhythm.     Pulses: Normal pulses.     Heart sounds: Normal heart sounds.  Pulmonary:     Effort: Pulmonary effort is normal.     Breath sounds: Normal breath sounds.  Chest:     Comments: She is status post left mastectomy.  No changes concerning for recurrence.  Right breast normal to inspection and palpation.  No regional adenopathy.  No skin changes. Abdominal:     General: Abdomen is flat. Bowel sounds are normal. There is no distension.     Palpations: Abdomen is soft.     Tenderness: There is no abdominal tenderness.  Musculoskeletal:        General: No swelling.     Cervical back: Neck supple.  Lymphadenopathy:     Cervical: No cervical adenopathy.  Skin:    General: Skin is warm and dry.     Findings: No rash.  Neurological:     General: No focal deficit present.     Mental Status: She is alert.  Psychiatric:        Mood and Affect: Mood normal.        Behavior: Behavior normal.     LABORATORY DATA:  CBC    Component Value Date/Time   WBC 4.8 02/28/2023 0850   RBC 3.55 (L) 02/28/2023 0850   HGB 11.4 (L) 02/28/2023 0850   HGB 11.6 (L) 07/20/2022 0856   HGB 13.3 10/01/2021 1004   HCT 33.3 (L) 02/28/2023 0850   HCT 40.4 10/01/2021 1004   PLT 162 02/28/2023 0850   PLT 220 07/20/2022 0856   PLT 212 10/01/2021 1004   MCV 93.8 02/28/2023 0850   MCV 87 10/01/2021 1004   MCH 32.1 02/28/2023 0850   MCHC 34.2 02/28/2023 0850   RDW 21.8 (H) 02/28/2023 0850  RDW 13.3 10/01/2021 1004   LYMPHSABS 1.1 02/28/2023 0850   LYMPHSABS 1.6 10/01/2021 1004   MONOABS 0.5 02/28/2023 0850   EOSABS 0.1 02/28/2023 0850   EOSABS 0.1 10/01/2021 1004   BASOSABS 0.0 02/28/2023 0850   BASOSABS 0.0 10/01/2021 1004    CMP     Component Value Date/Time   NA 138 02/28/2023 0850   NA 138 09/30/2022 1149   K 3.9 02/28/2023 0850   CL 105 02/28/2023 0850   CO2 28 02/28/2023 0850   GLUCOSE 98 02/28/2023 0850   BUN 11 02/28/2023 0850   BUN 11 09/30/2022 1149   CREATININE 0.67 02/28/2023 0850   CREATININE 0.78 12/23/2022 0959   CALCIUM 9.7 02/28/2023 0850   PROT 6.5 02/28/2023 0850   PROT 7.1 09/30/2022 1149   ALBUMIN 4.0 02/28/2023 0850   ALBUMIN 4.5 09/30/2022 1149   AST 17 02/28/2023 0850   AST 18 12/23/2022 0959   ALT 13 02/28/2023 0850   ALT 13 12/23/2022 0959   ALKPHOS 114 02/28/2023 0850   BILITOT 0.6 02/28/2023 0850   BILITOT 0.5 12/23/2022 0959   GFRNONAA >60 02/28/2023 0850   GFRNONAA >60 12/23/2022 0959   GFRAA >60 06/03/2020 0949   GFRAA >60 02/26/2020 0915      ASSESSMENT and THERAPY PLAN:   Malignant neoplasm of upper-outer quadrant of left breast in female, estrogen receptor positive (HCC) [name] is a 47 year old Spanish-speaking woman who has a 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  She was most recently seen for ongoing breast redness, given a course of antibiotics but had persistent redness hence we proceeded with a punch biopsy.  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  Pathology was sent to [facility] for second opinion as well. This confirmed triple negative breast cancer.  She progressed on after 2 cycles of TC hence we have switched her treatment to Enhertu.   Repeat biopsy demonstrated her HER2 to be 1+.  Given locally advanced and unresectable breast cancer and the progression on TC and previous exposure to  Adriamycin based regimen, we decided to try Enhertu for HER2 low disease and she had robust response.  She is now status post left mastectomy and axillary resection and there is no evidence of residual tumor, complete pathologic response obtained.  Since this is a recurrent breast cancer and high risk for systemic disease, I have discussed about adjuvant Xeloda based on the create X trial I will start her on adjuvant Xeloda after completion of radiation.  Once again we discussed the excellent response achieved so far, role of antiestrogen therapy like tamoxifen as well as adjuvant chemotherapy leg Xeloda. She completed adjuvant radiation.  She is now on adjuvant Xeloda at 1500 mg p.o. twice daily.  She is tolerating this dose pretty well.  She continues to have intermittent abdominal cramps but no nausea, vomiting, diarrhea or significant hand-foot syndrome.  She has noticed some hypopigmentation overall.  No changes on exam concerning for breast cancer recurrence.  Most recent exam with no concerns for residual or recurrent disease.  We have discussed about weight reduction given hepatic steatosis reported on the CT scan.  She will continue with Xeloda for another 4 cycles.  She will return to clinic in 3 weeks or sooner as needed.  CBC reviewed and without any concerns.  CMP pending at the time of my visit.     Total encounter time:30 minutes*in face-to-face  visit time, chart review, lab review, care coordination, order entry, and documentation of the encounter time.  Spanish interpreter was present for entirety of visit.    *Total Encounter Time as defined by the Centers for Medicare and Medicaid Services includes, in addition to the face-to-face time of a patient visit (documented in the note above) non-face-to-face time: obtaining and reviewing outside history, ordering and reviewing medications, tests or procedures, care coordination (communications with other health care professionals or  caregivers) and documentation in the medical record.  This note was electronically signed. Monica Moulds, MD 02/28/2023

## 2023-03-02 ENCOUNTER — Other Ambulatory Visit (HOSPITAL_COMMUNITY): Payer: Self-pay

## 2023-03-02 ENCOUNTER — Other Ambulatory Visit: Payer: Self-pay

## 2023-03-07 ENCOUNTER — Encounter: Payer: Self-pay | Admitting: Physical Therapy

## 2023-03-07 ENCOUNTER — Ambulatory Visit: Payer: No Typology Code available for payment source | Attending: Hematology and Oncology | Admitting: Physical Therapy

## 2023-03-07 DIAGNOSIS — Z483 Aftercare following surgery for neoplasm: Secondary | ICD-10-CM | POA: Insufficient documentation

## 2023-03-07 DIAGNOSIS — I972 Postmastectomy lymphedema syndrome: Secondary | ICD-10-CM | POA: Insufficient documentation

## 2023-03-07 DIAGNOSIS — R293 Abnormal posture: Secondary | ICD-10-CM | POA: Insufficient documentation

## 2023-03-07 DIAGNOSIS — M25612 Stiffness of left shoulder, not elsewhere classified: Secondary | ICD-10-CM | POA: Insufficient documentation

## 2023-03-07 NOTE — Therapy (Signed)
OUTPATIENT PHYSICAL THERAPY ONCOLOGY TREATMENT  Patient Name: [name] MRN: [id number] DOB:1976-01-23, 47 y.o., female Today's Date: 03/07/2023  END OF SESSION:  PT End of Session - 03/07/23 1143     Visit Number 27    Number of Visits 35    Date for PT Re-Evaluation 04/04/23    Authorization Type CAFA expires 7/28    PT Start Time 1102    PT Stop Time 1152    PT Time Calculation (min) 50 min    Activity Tolerance Patient tolerated treatment well    Behavior During Therapy [facility] for tasks assessed/performed                       Past Medical History:  Diagnosis Date   Allergy    Asthma    Breast cancer in female [facility])    Left   Breast disorder    breast cancer May 2021   Chronic back pain    Headache    High triglycerides    History of breast cancer    History of COVID-19 04/20/2021   Hx of migraines    Morbid obesity (HCC)    Personal history of chemotherapy    Personal history of radiation therapy    Past Surgical History:  Procedure Laterality Date   BREAST LUMPECTOMY     BREAST LUMPECTOMY WITH RADIOACTIVE SEED AND SENTINEL LYMPH NODE BIOPSY Left 07/15/2020   Procedure: LEFT BREAST LUMPECTOMY WITH RADIOACTIVE SEED AND LEFT AXILLARY SENTINEL LYMPH NODE BIOPSY, LEFT AXILLARY NODE RADIOACTIVE SEED GUIDED EXCISION, LEFT BREAST RADIOACTIVE SEED GUIDED EXCISION IM NODE;  Surgeon: Emelia Loron, MD;  Location: [facility];  Service: General;  Laterality: Left;  PEC BLOCK   CESAREAN SECTION WITH BILATERAL TUBAL LIGATION Bilateral 07/23/2015   Procedure: CESAREAN SECTION WITH BILATERAL TUBAL LIGATION;  Surgeon: Reva Bores, MD;  Location: [facility];  Service: Obstetrics;  Laterality: Bilateral;   IR IMAGING GUIDED PORT INSERTION  02/11/2022   MODIFIED MASTECTOMY Left 08/22/2022   Procedure: LEFT MODIFIED RADICAL MASTECTOMY, LEFT AXILLARY NODE DISSECTION, AND SKIN BIOPSY OF BACK;  Surgeon: Emelia Loron, MD;  Location: [facility];  Service: General;   Laterality: Left;   PORT-A-CATH REMOVAL N/A 04/30/2021   Procedure: REMOVAL PORT-A-CATH;  Surgeon: Emelia Loron, MD;  Location: [facility];  Service: General;  Laterality: N/A;   PORTACATH PLACEMENT Right 02/11/2020   Procedure: INSERTION PORT-A-CATH WITH ULTRASOUND GUIDANCE;  Surgeon: Emelia Loron, MD;  Location: [facility];  Service: General;  Laterality: Right;   TUBAL LIGATION     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) 04/01/2022   Encounter for dental examination 03/11/2022   Acute apical periodontitis 03/11/2022   Phobia of dental procedure 03/11/2022   Defective dental restoration 03/11/2022   Accretions on teeth 03/11/2022   Chronic periodontitis 03/11/2022   Teeth missing 03/11/2022   Caries 03/11/2022   Generalized gingival recession 03/11/2022   Pain, dental 03/08/2022   Cancer-related pain 03/08/2022   Port-A-Cath in place 02/15/2022   Oral allergy syndrome, subsequent encounter 02/03/2022   Adverse effect of other drugs, medicaments and biological substances, subsequent encounter 02/02/2022   Other adverse food reactions, not  elsewhere classified, subsequent encounter 02/02/2022   Chest tightness 02/02/2022   Other allergic rhinitis 02/02/2022   Allergic conjunctivitis of both eyes 02/02/2022   Language barrier 12/11/2020   Morbid obesity (HCC)    History of breast cancer    Genetic testing 02/13/2020   Malignant neoplasm of upper-outer quadrant of left breast in female, estrogen receptor positive (HCC) 01/27/2020   Impaired ability to use community resources due to language barrier 07/13/2013    PCP: Angus Seller, NP  REFERRING PROVIDER:  Rachel Moulds, MD  REFERRING DIAG: Left UE lymphedema  THERAPY DIAG:  Postmastectomy lymphedema  Stiffness of left shoulder, not elsewhere classified  Aftercare following surgery for neoplasm  Abnormal posture  ONSET DATE: 08/01/22  Rationale for Evaluation and Treatment: Rehabilitation  SUBJECTIVE:                                                                                                                                                                                           SUBJECTIVE STATEMENT:  My hand is swelling badly. I don't think the night time garment fits well.   PERTINENT HISTORY:  Patient was diagnosed on 01/23/2020 with left grade III invasive ductal carcinoma breast cancer. It is ER positive, PR negative, and HER2 negative , but functionally Triple Negative,with a Ki67 of 40%. Patient with interpreter present. She underwent neoadjuvant chemotherapy from 02/12/2020 - 05/28/2020. 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 She underwent a left lumpectomy and sentinel node biopsy ( 4 negative nodes) on 07/15/2020. She underwent radiation and anti-estrogen therapy.  She had interval development of diffuse skin thickening in 2023. She had a punch biopsy of her skin at that time and this was consistent with inflammatory breast cancer. She has been treated with chemo since then. She had a left modified radical mastectomy on 08/22/2022 with complete therapeutic response and 0/1 LN. She noted some swelling in her left arm and on 08/25/2022 she had an [place] that was negative for DVT. Her SOZO screen on 09/19/2021 was 6.9 points above baseline putting her in the yellow zone and she was given a compression sleeve that was not containing  her so a flat knit sleeve and glove and nighttime garment  have been ordered   PAIN:  Are you having pain? Pain in 2nd digit of L foot, currently 5/10, nothing makes it better or worse  PRECAUTIONS: Other: lymphedema [name]  WEIGHT BEARING  RESTRICTIONS: No  FALLS:  Has patient fallen in last 6 months? 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 Fullness in left lateral chest and back as well as left arm and hand especially on thumb side   OBSERVATIONS / OTHER ASSESSMENTS: scar well healed, increased edema at lateral trunk and throughout [name] reddened area in radiation field with skin appearing fragile and irritated but no open areas   POSTURE: forward head, rounded shoulders  UPPER EXTREMITY AROM/PROM:  A/PROM RIGHT   eval   Shoulder extension 68  Shoulder flexion 160  Shoulder abduction 175  Shoulder internal rotation 56  Shoulder external rotation 88    (Blank rows = not tested)  A/PROM LEFT   eval LEFT 11/10/22 LEFT 12/06/22 LEFT 12/08/2022 LEFT 12/19/2022 LEFT 01/23/23 LEFT 02/07/23  Shoulder extension 52        Shoulder flexion 146 150 156 155 165 162 162  Shoulder abduction 148 160 164 170 170 172 175  Shoulder internal rotation 67        Shoulder external rotation 86          (Blank rows = not tested)   LYMPHEDEMA ASSESSMENTS:  SURGERY TYPE/DATE: Left Lumpectomy with SLNB11/11/2019, Left modified mastectomy with left axillary node dissection NUMBER OF LYMPH NODES REMOVED: 0/4 with lumpectomy, 0/1 during Mastectomy CHEMOTHERAPY: yes RADIATION:Yes HORMONE TREATMENT: may  require INFECTIONS: none  LYMPHEDEMA ASSESSMENTS:  LANDMARK RIGHT  eval RIGHT 11/08/22  10 cm proximal to olecranon process 31.4 34.4  Olecranon process 27.2 28  10  cm proximal to ulnar styloid process 25.7 25.5  Just proximal to ulnar styloid process 18.4 19  Across hand at thumb web space 20 20  At base of 2nd digit 6.4 6.5  (Blank rows = not tested)  LANDMARK LEFT  eval LEFT 10/25/22 Left  11/03/22 LEFT 11/08/22 LEFT  12/08/2022  LEFT 02/07/23  10 cm proximal to olecranon process 33 33 31.9 33 34.3 33  Olecranon process 28.4 28.5 28.5 29 28.7 28.1  10 cm proximal to ulnar styloid process 26 26.5 26.4 26.6 26.5 26  Just proximal to ulnar styloid process 19.4 19 18.9 19.1 18.2 18.5  Across hand at thumb web space 21 20.5 19.7 20.7 19.5 20.4  2nd digit    6.7 6.5 6.4   TODAY'S TREATMENT:   03/07/23: Assessed remake of custom flat knit glove and it fit great. Assessed pt's night time garment for fit. Her custom circaid profile night time garment was made off the same measurements as her flat knit glove which was too large. Her night time garment is too large at the hand and she has been placing additional foam in it but it has not helped much. Her hand circumference is up 2 cm. Remeasured to see if she would fit in to an off the shelf garment but her upper arm was out of the size range so will need custom remade. Remeasured for custom circaid profile today and compared to original measurements. The original measurements are 2 cm larger at wrist and elbow. Will reach out to company for a remake.  Also discussed Flexi Touch pump and status with that. Pt reports that she has been unable to get it due to not having official proof of income.  02/07/23:  Remeasured ROM and circumferences Assessed progress towards goals Sent email to [facility] regarding financial application which pt filled out and therapist faxed and got confirmation that it was received but she just received it again Discussed pt's  progress towards goals and answered pt's questions about lingering numbness/discomfort in axillary region - educated this is normal post surgery Encouraged pt to follow up with foot doctor regarding pain in toe  Discussed pt following up with ortho once she finishes chemo since current back exercsies have not helped  01/23/23 Remeasured ROM Pelvic tilts with 5 sec holds x 10 reps Pelvic tilts with gentle alternating hip abduction in hook lying x 10 reps Pelvic tilts with alternating marching in hook lying x 10 reps Standing with back against wall with core engaged and shoulders back -3 way shoulder with 1 lb weight with pt reporting difficulty with this and back pain Educated pt about why exercising can help back pain and how tight pecs and weak back muscles causing increased back pain and the role of core muscles in helping support the low back Supine horizontal abduction with yellow band x 10 reps Supine overhead flexion with yellow band x 10 reps No back pain by end of session  01/13/23: Discussed how current glove is not managing her swelling. Discussed her current schedule for wearing her night time and day time garment. When pt takes off her night time garment she does her morning routine prior to donning day time garment. Educated pt to immediately don day time garment. Pt reports it is difficult to get ready, get her kids ready and cook breakfast without soiling glove so issued her disposable gloves to don over her glove. Also, her current glove is too loose and there is fabric at back of glove that can be pinched. Emailed [employer] about a Therapist, music. Educated pt that she can still bandage if she feels her hand is swelling. Began AAROM exercises today: Pulleys x 2 min in direction of flexion and abduction with v/c to decrease scapular compensation Ball up wall x 10 reps in direction of flexion and abduction with pt returning therapist demo - pt reported back pain with this so educated pt on  importance of posture and need for core strength to protect back Pelvic tilts with max v/c and t/c initially and therapist demo with pt able to return demo by end of session - issued this as part of HEP  01/03/23: Demonstrated correct way to don/doff compression sleeve and glove. Had pt return demonstrate. Sleeve fit very well. Educated pt on care instructions and educated her to wear her sleeve during the day and apply compression bandages at night.   12/28/2022  MLD: Short neck, L 5 diaphragmatic breaths, bil axillary nodes and establishment of interaxillary pathway, Lt inguinal nodes, Lt axillo-inguinal anastomosis spending increased time in area of fullness inferior to axilla, L UE working proximal to distal, then retracing all steps.  Educated pt about availability of compression tank tops and educated her that this may help since her compression bra has not been helping lateral trunk edema and digs in right below her swelling Compression Bandaging: TG soft from hand to axilla,  mollelast to digits 1-4 with  , artiflex from hand to axilla, 1 6 cm bandage at hand, 1 8 cm bandage from wrist to axilla with  x at antecubital fossa and 1 12 cm bandage from wrist to axilla   12/21/2022: Rep from Flex[employer] in clinic today. Arrangements were made for rep to come to our clinic at 11:00 on 01/03/2023 to do the demonstration for the Flexitouch and fill out the needed paperwork to see if pt qualifies for a donated garment. Also checked on the sleeve as the [employer] portal says that they are waiting for payment and pt given information to contact them about it.  Pt askes for MLD only as she cannot do that at home so it was performed as on 12/19/2022 with extra time and stretch to anterior chest and MLD at fullness at anterior and posterior axilla.    12/19/2022  MLD: Short neck, L 5 diaphragmatic breaths, bil axillary nodes and establishment of interaxillary pathway, Lt inguinal nodes, Lt axillo-inguinal anastomosis L  UE working proximal to distal especially at thumb side of hand with arm elevated. then retracing all steps. Then to sidelying for more prolonged work on posterior interaxillary anastamosis with gentle pressure from one hand on lateral chest and axilla. In sidelying, small circles with hand pointed to ceiling, shoulder flexion and external rotation. X 10  Talked with pt about Flex[employer] but she says she has not heard from them yet, but may have not picked up their call.  Gave pt [employer] card and asked to call them to check on it herself ( with assist from English speaking family) Reinforced scar massage and skin stretch at anterior chest since she is healed from radiation   12/15/2022  MLD: Short neck, L 5 diaphragmatic breaths, bil axillary nodes and establishment of interaxillary pathway, Lt inguinal nodes, Lt axillo-inguinal anastomosis avoiding radiation field tenderness, L UE working proximal to distal, then retracing all steps. Then to sidelying for more prolonged work on Merchandiser, retail. supine dowel stretch in flexion and abduction In sidelying, small circles with hand pointed to ceiling,x 5  short range abduction abduction with straight arm, shoulder flexion and external rotation. X 10  Compression Bandaging: TG soft from hand to axilla,  mollelast to digits 1-5 with  , gray foam over dorsum of hand and wrist, artiflex from hand to axilla, 1 6 cm bandage at hand, 1 8 cm bandage from wrist to axilla with x at antecubital fossa and 1 12 cm bandage from wrist to axilla   12/06/2022 MLD: Short neck, L 5 diaphragmatic breaths, R axillary nodes and establishment of interaxillary pathway, Lt inguinal nodes, Lt axillo-inguinal anastomosis avoiding radiation field, L UE working proximal to distal, then retracing all steps.  PROM to L shoulder gentle in to flexion and abduction making sure to avoid any pulling at fragile skin Compression Bandaging: TG soft from hand to axilla,  mollelast  to digits 1-4 with  , artiflex from hand to axilla, 1 6 cm bandage at hand, 1 8 cm bandage from wrist to axilla in herringbone fashion and 1 12 cm bandage from wrist to axilla   12/01/2022 MLD: Short neck, L 5 diaphragmatic breaths, R axillary nodes and establishment of interaxillary pathway, Lt inguinal nodes, Lt axillo-inguinal anastomosis avoiding radiation field, L UE working proximal to distal, then to R s/l to work on L lateral trunk moving fluid towards pathways then back to supine retracing all steps.  PROM to L shoulder gentle in to flexion and abduction making sure to avoid any pulling at fragile skin  11/29/2022 MLD: Short neck, L 5 diaphragmatic breaths, R axillary nodes and establishment of interaxillary  pathway, Lt inguinal nodes, Lt axillo-inguinal anastomosis avoiding radiation field, L UE working proximal to distal, then to R s/l to work on L lateral trunk moving fluid towards pathways then back to supine retracing all steps.  PROM to L shoulder gentle in to flexion and abduction making sure to avoid any pulling at fragile skin Compression Bandaging: TG soft from hand to axilla,  mollelast to digits 1-5 with  , gray foam over dorsum of hand and wrist, artiflex from hand to axilla, 1 6 cm bandage at hand, 1 8 cm bandage from wrist to axilla with x at antecubital fossa and 1 12 cm bandage from wrist to axilla    11/24/2022 MLD: Short neck, Lt inguinal nodes, Lt axillo-inguinal anastomosis, avoiding radiation field, L UE working proximal to distal then retracing all steps. Did not work on lateral trunk due to compromised skin from radiation Compression Bandaging: TG soft from hand to axilla,  mollelast to digits 1-5 with  , gray foam over dorsum of hand and wrist, artiflex from hand to axilla, 1 6 cm bandage at hand, 1 8 cm bandage from wrist to axilla with x at antecubital fossa and 1 12 cm bandage from wrist to axilla                                                                                                                                   PATIENT EDUCATION:  Education details:how weak/overstretched back muscles with tight pecs can cause back pain, how weak core can cause back pain Person educated: Patient Education method: Actor given  Education comprehension: verbalized understanding  HOME EXERCISE PROGRAM: Obtain compression bra, continue to wear sleeve and glove Pelvic tilts x 10 reps each with 5 sec holds  Add hip abduction with pelvic tilt  Add marching with pelvic tilt Supine overhead flexion and horizontal abduction with yellow band  ASSESSMENT:  CLINICAL IMPRESSION: Assessed remake of custom glove and it fit great. She has increased edema at dorsum of hand. Assessed custom night time garment for fit and it was too large at hand, wrist and elbow. Pt will need a remake of this as well. Pt did not bring it for therapist to assess last visit. Remeasured for remake of custom circaid and will reach out to DME company. Pt would benefit from 1 additional visit to assess fit of remake once pt receives it.   OBJECTIVE IMPAIRMENTS: decreased knowledge of condition, decreased ROM, decreased strength, increased edema, increased fascial restrictions, impaired UE functional use, postural dysfunction, and pain.   ACTIVITY LIMITATIONS: carrying, lifting, and reach over head  PARTICIPATION LIMITATIONS: cleaning, laundry, community activity, occupation, and yard work  PERSONAL FACTORS: Time since onset of injury/illness/exacerbation are also affecting patient's functional outcome.   REHAB POTENTIAL: Good  CLINICAL DECISION MAKING: Stable/uncomplicated  EVALUATION COMPLEXITY: Low  GOALS: Goals reviewed with patient? Yes   SHORT TERM GOALS = LONG TERM GOALS: Target date: 11/09/22  Pt will be independent in self MLD for long term management of lymphedema.  Baseline:  Goal status: MET 02/07/23 pt is independent with this   2.  Pt will obtain appropriate  compression garments for long term management of lymphedema.  Baseline:  Goal status: IN PROGRESS 02/07/23- pt has obtained a flat knit sleeve and glove  and night time garment - she is awaiting remake of glove which was too large  3.  Pt will demonstrate 160 degrees of L shoulder flexion to allow her to reach overhead. Baseline:  Goal status: MET 02/07/23: 162; 11/09/2022:  155  4. Pt will demonstrate 170 degrees of L shoulder abduction to allow her to reach out to the side.   Goal:MET 02/07/23: 175,  12/08/22 - 164  5. Pt will be independent in a home exercise program for continued stretching and strengthening.  Goal: MET 02/07/23  6. Pt will report she is able to raise her L arm to end range wihtout increased pain in her axilla to allow improved comfort.   GOAL: MET 02/07/23- pt reports there is no pain but still numbness, educated pt that numbness is normal  PLAN:  PT FREQUENCY:one visit  PT DURATION: 4 weeks   PLANNED INTERVENTIONS: Therapeutic exercises, Therapeutic activity, Patient/Family education, Self Care, Joint mobilization, Manual lymph drainage, Compression bandaging, scar mobilization, Taping, Vasopneumatic device, and Manual therapy  PLAN FOR NEXT SESSION recheck remake of night time garment and follow up on Flexi in 1 month   Leonette Most, PT 03/07/2023, 12:09 PM [facility] [address] [place], [place], [address] Phone: [phone]   Fax:  [phone]

## 2023-03-09 ENCOUNTER — Other Ambulatory Visit: Payer: Self-pay

## 2023-03-10 ENCOUNTER — Other Ambulatory Visit: Payer: Self-pay

## 2023-03-14 ENCOUNTER — Encounter: Payer: Self-pay | Admitting: Hematology and Oncology

## 2023-03-14 ENCOUNTER — Other Ambulatory Visit: Payer: Self-pay

## 2023-03-14 ENCOUNTER — Other Ambulatory Visit (HOSPITAL_COMMUNITY): Payer: Self-pay

## 2023-03-17 ENCOUNTER — Other Ambulatory Visit: Payer: Self-pay

## 2023-03-23 ENCOUNTER — Other Ambulatory Visit: Payer: Self-pay

## 2023-03-23 ENCOUNTER — Inpatient Hospital Stay
Payer: No Typology Code available for payment source | Attending: Hematology and Oncology | Admitting: Hematology and Oncology

## 2023-03-23 ENCOUNTER — Inpatient Hospital Stay: Payer: No Typology Code available for payment source

## 2023-03-23 VITALS — BP 105/63 | HR 76 | Temp 97.9°F | Resp 18 | Wt 193.3 lb

## 2023-03-23 DIAGNOSIS — J45909 Unspecified asthma, uncomplicated: Secondary | ICD-10-CM | POA: Insufficient documentation

## 2023-03-23 DIAGNOSIS — Z8616 Personal history of COVID-19: Secondary | ICD-10-CM | POA: Insufficient documentation

## 2023-03-23 DIAGNOSIS — E538 Deficiency of other specified B group vitamins: Secondary | ICD-10-CM | POA: Insufficient documentation

## 2023-03-23 DIAGNOSIS — Z171 Estrogen receptor negative status [ER-]: Secondary | ICD-10-CM | POA: Insufficient documentation

## 2023-03-23 DIAGNOSIS — Z9221 Personal history of antineoplastic chemotherapy: Secondary | ICD-10-CM | POA: Insufficient documentation

## 2023-03-23 DIAGNOSIS — E669 Obesity, unspecified: Secondary | ICD-10-CM | POA: Insufficient documentation

## 2023-03-23 DIAGNOSIS — Z923 Personal history of irradiation: Secondary | ICD-10-CM | POA: Insufficient documentation

## 2023-03-23 DIAGNOSIS — C50412 Malignant neoplasm of upper-outer quadrant of left female breast: Secondary | ICD-10-CM | POA: Insufficient documentation

## 2023-03-23 DIAGNOSIS — Z170 Estrogen receptor positive status [ER+]: Secondary | ICD-10-CM

## 2023-03-23 DIAGNOSIS — E7800 Pure hypercholesterolemia, unspecified: Secondary | ICD-10-CM | POA: Insufficient documentation

## 2023-03-23 DIAGNOSIS — G893 Neoplasm related pain (acute) (chronic): Secondary | ICD-10-CM | POA: Insufficient documentation

## 2023-03-23 DIAGNOSIS — E559 Vitamin D deficiency, unspecified: Secondary | ICD-10-CM | POA: Insufficient documentation

## 2023-03-23 LAB — COMPREHENSIVE METABOLIC PANEL
ALT: 16 U/L (ref 0–44)
AST: 22 U/L (ref 15–41)
Albumin: 4 g/dL (ref 3.5–5.0)
Alkaline Phosphatase: 104 U/L (ref 38–126)
Anion gap: 7 (ref 5–15)
BUN: 16 mg/dL (ref 6–20)
CO2: 27 mmol/L (ref 22–32)
Calcium: 9.5 mg/dL (ref 8.9–10.3)
Chloride: 107 mmol/L (ref 98–111)
Creatinine, Ser: 0.76 mg/dL (ref 0.44–1.00)
GFR, Estimated: >60 mL/min (ref >60)
Glucose, Bld: 90 mg/dL (ref 70–99)
Potassium: 3.8 mmol/L (ref 3.5–5.1)
Sodium: 141 mmol/L (ref 135–145)
Total Bilirubin: 0.9 mg/dL (ref 0.3–1.2)
Total Protein: 6.9 g/dL (ref 6.5–8.1)

## 2023-03-23 LAB — CBC WITH DIFFERENTIAL/PLATELET
Abs Immature Granulocytes: 0.02 10*3/uL (ref 0.00–0.07)
Basophils Absolute: 0 10*3/uL (ref 0.0–0.1)
Basophils Relative: 1 %
Eosinophils Absolute: 0.1 10*3/uL (ref 0.0–0.5)
Eosinophils Relative: 3 %
HCT: 34.2 % — ABNORMAL LOW (ref 36.0–46.0)
Hemoglobin: 11.9 g/dL — ABNORMAL LOW (ref 12.0–15.0)
Immature Granulocytes: 1 %
Lymphocytes Relative: 30 %
Lymphs Abs: 1 10*3/uL (ref 0.7–4.0)
MCH: 34.3 pg — ABNORMAL HIGH (ref 26.0–34.0)
MCHC: 34.8 g/dL (ref 30.0–36.0)
MCV: 98.6 fL (ref 80.0–100.0)
Monocytes Absolute: 0.4 10*3/uL (ref 0.1–1.0)
Monocytes Relative: 12 %
Neutro Abs: 1.8 10*3/uL (ref 1.7–7.7)
Neutrophils Relative %: 53 %
Platelets: 143 10*3/uL — ABNORMAL LOW (ref 150–400)
RBC: 3.47 MIL/uL — ABNORMAL LOW (ref 3.87–5.11)
RDW: 18.5 % — ABNORMAL HIGH (ref 11.5–15.5)
WBC: 3.3 10*3/uL — ABNORMAL LOW (ref 4.0–10.5)
nRBC: 0 % (ref 0.0–0.2)

## 2023-03-23 NOTE — Progress Notes (Signed)
[facility] Cancer Follow up:    Monica Andrew, NP [address] [place] [address]   DIAGNOSIS:  Cancer Staging  Malignant neoplasm of upper-outer quadrant of left breast in female, estrogen receptor positive (HCC) Staging form: Breast, AJCC 8th Edition - Clinical stage from 01/29/2020: Stage IIIA (cT2, cN1, cM0, G3, ER+, PR-, HER2-) - Signed by Rachel Moulds, MD on 06/28/2022 Stage prefix: Initial diagnosis Histologic grading system: 3 grade system   SUMMARY OF ONCOLOGIC HISTORY: Oncology History  Malignant neoplasm of upper-outer quadrant of left breast in female, estrogen receptor positive (HCC)  01/23/2020 Initial Diagnosis   [place] woman status post left breast upper outer quadrant biopsy 01/23/2020 for a clinical T3 N1, 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             (a) chest CT scan and bone scan 02/07/2020 showed no evidence of metastatic disease   01/29/2020 Cancer Staging   Staging form: Breast, AJCC 8th Edition - Clinical stage from 01/29/2020: Stage IIIA (cT2, cN1, cM0, G3, ER+, PR-, HER2-) - Signed by Rachel Moulds, MD on 06/28/2022 Stage prefix: Initial diagnosis Histologic grading system: 3 grade system   02/07/2020 Genetic Testing   Negative genetic testing. No pathogenic variants identified on the [facility] Common Hereditary Cancers Panel. VUS in MSH6 called c.2744C>G identified. The report date is 02/07/2020.  The Common Hereditary Cancers Panel offered by [facility] includes sequencing and/or deletion duplication testing of the following 48 genes: APC, ATM, AXIN2, BARD1, BMPR1A, BRCA1, BRCA2, BRIP1, CDH1, CDKN2A (p14ARF), CDKN2A (p16INK4a), CKD4, CHEK2, CTNNA1, DICER1, EPCAM (Deletion/duplication testing only), GREM1 (promoter region deletion/duplication testing only), KIT, MEN1, MLH1, MSH2, MSH3, MSH6, MUTYH, NBN, NF1, NHTL1, PALB2, PDGFRA, PMS2, POLD1, POLE, PTEN, RAD50,  RAD51C, RAD51D, RNF43, SDHB, SDHC, SDHD, SMAD4, SMARCA4. STK11, TP53, TSC1, TSC2, and VHL.  The following genes were evaluated for sequence changes only: SDHA and HOXB13 c.251G>A variant only.  UPDATE: MSH6 c.2744C>G (p.Ala915Gly) VUS has been amended to Benign. Amended report date is 02/13/2023.   02/12/2020 - 05/28/2020 Neo-Adjuvant Chemotherapy   neoadjuvant chemotherapy consisting of doxorubicin and cyclophosphamide in dose dense fashion x4 started 02/12/2020, completed 03/25/2020, followed by paclitaxel and carboplatin weekly x12 started 04/08/2020 and completed on 05/28/2020             (a) echo 02/07/2020 shows an ejection fraction in the 55-60% range             (b) Epirubicin substituted for Doxorubicin starting with cycle 2 of AC secondary to allergic rash from Doxorubicin   07/15/2020 Surgery   status post left lumpectomy and sentinel lymph node sampling 07/15/2020 for a ypT1b ypN0 residual invasive ductal carcinoma, grade 3, with negative margins.             (a) a total of 4 left axillary lymph nodes were removed             (b) repeat prognostic panel on the final pathology again triple negative, with an MIB-1 of 50%.   08/26/2020 - 10/09/2020 Radiation Therapy   adjuvant radiation completed 10/09/2020             (a) sensitizing capecitabine attempted but not tolerated by the patient  08/26/2020 through 10/09/2020 Site Technique Total Dose (Gy) Dose per Fx (Gy) Completed Fx Beam Energies  Breast, Left: Breast_Lt 3D 50/50 2 25/25 15X  Breast, Left: Breast_Lt_PAB_SCV 3D 50/50 2 25/25 10X, 15X  Breast, Left:  Breast_Lt_Bst 3D 10/10 2 5/5 6X, 10X     11/23/2020 - 01/04/2021 Adjuvant Chemotherapy   adjuvant pembrolizumab started 11/23/2020, to continue for 1 year             (a) discontinued after 01/04/2021 dose with multiple side effects requiring steroids for resolution   03/30/2022 Mammogram   Mammogram and ultrasound show numerous masses throughout left breast not visualized in  01/2022, +left axillary lymphadenopathy, skin thickening in right breast and in upper abdomen just below sternum   04/05/2022 - 07/20/2022 Chemotherapy   Patient is on Treatment Plan : BREAST METASTATIC Fam-Trastuzumab Deruxtecan-nxki (Enhertu) (5.4) q21d     08/21/2022 Surgery   Left breast mastectomy and lymph node biopsy shows no residual carcinoma.     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  Prognostic panel sent to [facility]: Left breast skin, punch biopsy: Skin with dermal lymphatic involvement by carcinoma (confirmed with outside CK7 IHC, reviewed). Biomarkers are reported as negative for ER, PR, Her2/neu (confirmed on review but please note sample size is extremely small).   02/15/2022 - 03/08/2022 Chemotherapy    Taxotere/Cytoxan x 2, discontinued due to progression of breast.   03/30/2022 Mammogram   Mammogram and ultrasound show numerous masses throughout left breast not visualized in 01/2022, +left axillary lymphadenopathy, skin thickening in right breast and in upper abdomen just below sternum   04/05/2022 - 07/20/2022 Chemotherapy   Patient is on Treatment Plan : BREAST METASTATIC Fam-Trastuzumab Deruxtecan-nxki (Enhertu) (5.4) q21d     11/25/2022 -  Chemotherapy   Patient is on Treatment Plan : BREAST Capecitabine q21d       CURRENT THERAPY: capecitabine  INTERVAL HISTORY:  [name] 47 y.o. female returns for f/u of her metasattic breast cancer currently on treatment with Capecitabine, 500mg ; 3 tabs Q12 hours days 1-14 taken every 21 days.  She is here for follow-up.  Since her last visit here, she continues to have some bloating, abdominal pain especially in the right upper quadrant, no relationship to meals.  She also reports some vision changes, some teeth sensitivity.  She had a cough  for couple months which has now resolved.  No chest pain or shortness of breath. No nausea, vomiting, diarrhea.  No changes in the breast area. No change in breathing. 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) 04/01/2022   Encounter for dental examination 03/11/2022   Acute apical periodontitis 03/11/2022   Phobia of dental procedure 03/11/2022   Defective dental restoration 03/11/2022   Accretions on teeth 03/11/2022   Chronic periodontitis 03/11/2022   Teeth missing 03/11/2022   Caries 03/11/2022   Generalized gingival recession 03/11/2022   Pain, dental 03/08/2022   Cancer-related pain 03/08/2022   Port-A-Cath in place 02/15/2022   Oral allergy syndrome, subsequent encounter 02/03/2022   Adverse effect of other drugs, medicaments and biological substances, subsequent encounter 02/02/2022   Other adverse food reactions, not elsewhere classified, subsequent encounter 02/02/2022   Chest  tightness 02/02/2022   Other allergic rhinitis 02/02/2022   Allergic conjunctivitis of both eyes 02/02/2022   Language barrier 12/11/2020   Morbid obesity (HCC)    History of breast cancer    Genetic testing 02/13/2020   Malignant neoplasm of upper-outer quadrant of left breast in female, estrogen receptor positive (HCC) 01/27/2020   Impaired ability to use community resources due to language barrier 07/13/2013    is allergic to omnipaque [iohexol], doxorubicin hcl, keytruda [pembrolizumab], other, tape, and wound dressing adhesive.  MEDICAL HISTORY: Past Medical History:  Diagnosis Date   Allergy    Asthma    Breast cancer in female [facility])    Left   Breast  disorder    breast cancer May 2021   Chronic back pain    Headache    High triglycerides    History of breast cancer    History of COVID-19 04/20/2021   Hx of migraines    Morbid obesity (HCC)    Personal history of chemotherapy    Personal history of radiation therapy     SURGICAL HISTORY: Past Surgical History:  Procedure Laterality Date   BREAST LUMPECTOMY     BREAST LUMPECTOMY WITH RADIOACTIVE SEED AND SENTINEL LYMPH NODE BIOPSY Left 07/15/2020   Procedure: LEFT BREAST LUMPECTOMY WITH RADIOACTIVE SEED AND LEFT AXILLARY SENTINEL LYMPH NODE BIOPSY, LEFT AXILLARY NODE RADIOACTIVE SEED GUIDED EXCISION, LEFT BREAST RADIOACTIVE SEED GUIDED EXCISION IM NODE;  Surgeon: Emelia Loron, MD;  Location: [facility] OR;  Service: General;  Laterality: Left;  PEC BLOCK   CESAREAN SECTION WITH BILATERAL TUBAL LIGATION Bilateral 07/23/2015   Procedure: CESAREAN SECTION WITH BILATERAL TUBAL LIGATION;  Surgeon: Reva Bores, MD;  Location: [facility];  Service: Obstetrics;  Laterality: Bilateral;   IR IMAGING GUIDED PORT INSERTION  02/11/2022   MODIFIED MASTECTOMY Left 08/22/2022   Procedure: LEFT MODIFIED RADICAL MASTECTOMY, LEFT AXILLARY NODE DISSECTION, AND SKIN BIOPSY OF BACK;  Surgeon: Emelia Loron, MD;  Location: [facility];  Service: General;  Laterality: Left;   PORT-A-CATH REMOVAL N/A 04/30/2021   Procedure: REMOVAL PORT-A-CATH;  Surgeon: Emelia Loron, MD;  Location: [facility];  Service: General;  Laterality: N/A;   PORTACATH PLACEMENT Right 02/11/2020   Procedure: INSERTION PORT-A-CATH WITH ULTRASOUND GUIDANCE;  Surgeon: Emelia Loron, MD;  Location: [facility];  Service: General;  Laterality: Right;   TUBAL LIGATION      SOCIAL HISTORY: Social History   Socioeconomic History   Marital status: Legally Separated    Spouse name: Not on file   Number of children: 3   Years of education: Not on file   Highest education level: 9th grade  Occupational  History   Occupation: Holiday representative  Tobacco Use   Smoking status: Never   Smokeless tobacco: Never  Vaping Use   Vaping status: Never Used  Substance and Sexual Activity   Alcohol use: No   Drug use: Never   Sexual activity: Yes    Birth control/protection: Surgical  Other Topics Concern   Not on file  Social History Narrative   Not on file   Social Determinants of Health   Financial Resource Strain: Not on file  Food Insecurity: No Food Insecurity (10/11/2022)   Hunger Vital Sign    Worried About Running Out of Food in the Last Year: Never true    Ran Out of Food in the Last Year: Never true  Transportation Needs: No Transportation Needs (10/11/2022)   PRAPARE - Transportation    Lack of  Transportation (Medical): No    Lack of Transportation (Non-Medical): No  Physical Activity: Not on file  Stress: Not on file  Social Connections: Not on file  Intimate Partner Violence: Not At Risk (10/11/2022)   Humiliation, Afraid, Rape, and Kick questionnaire    Fear of Current or Ex-Partner: No    Emotionally Abused: No    Physically Abused: No    Sexually Abused: No    FAMILY HISTORY: Family History  Problem Relation Age of Onset   Heart disease Maternal Grandmother     Review of Systems  Constitutional:  Negative for appetite change, chills, fatigue, fever and unexpected weight change.  HENT:   Negative for hearing loss, lump/mass and trouble swallowing.   Eyes:  Positive for eye problems. Negative for icterus.  Respiratory:  Negative for chest tightness, cough and shortness of breath.   Cardiovascular:  Negative for chest pain, leg swelling and palpitations.  Gastrointestinal:  Negative for abdominal distention, abdominal pain, constipation, diarrhea, nausea and vomiting.  Endocrine: Negative for hot flashes.  Genitourinary:  Negative for difficulty urinating.   Musculoskeletal:  Negative for arthralgias.  Skin:  Negative for itching and rash.  Neurological:  Negative for  dizziness, extremity weakness, headaches and numbness.  Hematological:  Negative for adenopathy. Does not bruise/bleed easily.  Psychiatric/Behavioral:  Negative for depression. The patient is not nervous/anxious.       PHYSICAL EXAMINATION    Vitals:   03/23/23 0903  BP: 105/63  Pulse: 76  Resp: 18  Temp: 97.9 F (36.6 C)  SpO2: 98%     Physical Exam Constitutional:      General: She is not in acute distress.    Appearance: Normal appearance. She is not toxic-appearing.  HENT:     Head: Normocephalic and atraumatic.  Eyes:     General: No scleral icterus. Cardiovascular:     Rate and Rhythm: Normal rate and regular rhythm.     Pulses: Normal pulses.     Heart sounds: Normal heart sounds.  Pulmonary:     Effort: Pulmonary effort is normal.     Breath sounds: Normal breath sounds.  Chest:     Comments: She is status post left mastectomy.  No changes concerning for recurrence.  Right breast normal to inspection and palpation.  No regional adenopathy.  No skin changes. Abdominal:     General: Abdomen is flat. Bowel sounds are normal. There is no distension.     Palpations: Abdomen is soft.     Tenderness: There is no abdominal tenderness.  Musculoskeletal:        General: No swelling.     Cervical back: Neck supple.  Lymphadenopathy:     Cervical: No cervical adenopathy.  Skin:    General: Skin is warm and dry.     Findings: No rash.  Neurological:     General: No focal deficit present.     Mental Status: She is alert.  Psychiatric:        Mood and Affect: Mood normal.        Behavior: Behavior normal.     LABORATORY DATA:  CBC    Component Value Date/Time   WBC 4.8 02/28/2023 0850   RBC 3.55 (L) 02/28/2023 0850   HGB 11.4 (L) 02/28/2023 0850   HGB 11.6 (L) 07/20/2022 0856   HGB 13.3 10/01/2021 1004   HCT 33.3 (L) 02/28/2023 0850   HCT 40.4 10/01/2021 1004   PLT 162 02/28/2023 0850   PLT 220 07/20/2022 0856  PLT 212 10/01/2021 1004   MCV 93.8  02/28/2023 0850   MCV 87 10/01/2021 1004   MCH 32.1 02/28/2023 0850   MCHC 34.2 02/28/2023 0850   RDW 21.8 (H) 02/28/2023 0850   RDW 13.3 10/01/2021 1004   LYMPHSABS 1.1 02/28/2023 0850   LYMPHSABS 1.6 10/01/2021 1004   MONOABS 0.5 02/28/2023 0850   EOSABS 0.1 02/28/2023 0850   EOSABS 0.1 10/01/2021 1004   BASOSABS 0.0 02/28/2023 0850   BASOSABS 0.0 10/01/2021 1004    CMP     Component Value Date/Time   NA 138 02/28/2023 0850   NA 138 09/30/2022 1149   K 3.9 02/28/2023 0850   CL 105 02/28/2023 0850   CO2 28 02/28/2023 0850   GLUCOSE 98 02/28/2023 0850   BUN 11 02/28/2023 0850   BUN 11 09/30/2022 1149   CREATININE 0.67 02/28/2023 0850   CREATININE 0.78 12/23/2022 0959   CALCIUM 9.7 02/28/2023 0850   PROT 6.5 02/28/2023 0850   PROT 7.1 09/30/2022 1149   ALBUMIN 4.0 02/28/2023 0850   ALBUMIN 4.5 09/30/2022 1149   AST 17 02/28/2023 0850   AST 18 12/23/2022 0959   ALT 13 02/28/2023 0850   ALT 13 12/23/2022 0959   ALKPHOS 114 02/28/2023 0850   BILITOT 0.6 02/28/2023 0850   BILITOT 0.5 12/23/2022 0959   GFRNONAA >60 02/28/2023 0850   GFRNONAA >60 12/23/2022 0959   GFRAA >60 06/03/2020 0949   GFRAA >60 02/26/2020 0915      ASSESSMENT and THERAPY PLAN:   Malignant neoplasm of upper-outer quadrant of left breast in female, estrogen receptor positive (HCC) [name] is a 47 year old Spanish-speaking woman who has a 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  She was most recently seen for ongoing breast redness, given a course of antibiotics but had persistent redness hence we proceeded with a punch biopsy.  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  Pathology was sent to [facility] for second opinion as well. This confirmed triple negative breast cancer. Repeat biopsy demonstrated her HER2 to be 1+.  She progressed on after 2 cycles of TC neoadjuvantly hence we have  switched her treatment to Enhertu.   Given locally advanced and unresectable breast cancer and the progression on TC and previous exposure to Adriamycin based regimen, we decided to try Enhertu for HER2 low disease and she had robust response.  She is now status post left mastectomy and axillary resection and there is no evidence of residual tumor, complete pathologic response obtained.  Since this is a recurrent breast cancer and high risk for systemic disease, I have discussed about adjuvant Xeloda based on the create X trial, she is now on adjuvant xeloda. She is now on adjuvant Xeloda at 1500 mg p.o. twice daily.  She is tolerating this dose pretty well.  She continues to have intermittent abdominal cramps but no nausea, vomiting, diarrhea or significant hand-foot syndrome.   She has grade 1 hand-foot syndrome and she also reports abdominal pain, bloating and right upper quadrant tenderness from time to time.  Hence we have discussed about reducing the dose to 1500 mg a.m. and 1000 mg every p.m.  With regards to the eye changes, we are not entirely sure if this is related to Xeloda hence have once again advised her to see an eye doctor and she expressed understanding.  There is no other concern for recurrence.  Plan to continue Xeloda  through end of September.  Given her very high risk of recurrence, I think it is reasonable to pursue imaging twice a year for follow-up.  She will return to clinic in 3 weeks.  Labs today.   Total encounter time:30 minutes*in face-to-face visit time, chart review, lab review, care coordination, order entry, and documentation of the encounter time.  Spanish interpreter was present for entirety of visit.    *Total Encounter Time as defined by the Centers for Medicare and Medicaid Services includes, in addition to the face-to-face time of a patient visit (documented in the note above) non-face-to-face time: obtaining and reviewing outside history, ordering and reviewing  medications, tests or procedures, care coordination (communications with other health care professionals or caregivers) and documentation in the medical record.  This note was electronically signed. Rachel Moulds, MD 03/23/2023

## 2023-03-23 NOTE — Assessment & Plan Note (Addendum)
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  She was most recently seen for ongoing breast redness, given a course of antibiotics but had persistent redness hence we proceeded with a punch biopsy.  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  Pathology was sent to [facility] for second opinion as well. This confirmed triple negative breast cancer. Repeat biopsy demonstrated her HER2 to be 1+.  She progressed on after 2 cycles of TC neoadjuvantly hence we have switched her treatment to Enhertu.   Given locally advanced and unresectable breast cancer and the progression on TC and previous exposure to Adriamycin based regimen, we decided to try Enhertu for HER2 low disease and she had robust response.  She is now status post left mastectomy and axillary resection and there is no evidence of residual tumor, complete pathologic response obtained.  Since this is a recurrent breast cancer and high risk for systemic disease, I have discussed about adjuvant Xeloda based on the create X trial, she is now on adjuvant xeloda. She is now on adjuvant Xeloda at 1500 mg p.o. twice daily.  She is tolerating this dose pretty well.  She continues to have intermittent abdominal cramps but no nausea, vomiting, diarrhea or significant hand-foot syndrome.   She has grade 1 hand-foot syndrome and she also reports abdominal pain, bloating and right upper quadrant tenderness from time to time.  Hence we have discussed about reducing the dose to 1500 mg a.m. and 1000 mg every p.m.  With regards to the eye changes, we are not entirely sure if this is related to Xeloda hence have once again advised her to see an eye doctor and she expressed understanding.  There is no other concern for recurrence.  Plan to continue Xeloda through end of September.  Given her very high risk of  recurrence, I think it is reasonable to pursue imaging twice a year for follow-up.  She will return to clinic in 3 weeks.  Labs today.

## 2023-04-04 ENCOUNTER — Other Ambulatory Visit (HOSPITAL_COMMUNITY): Payer: Self-pay

## 2023-04-06 ENCOUNTER — Other Ambulatory Visit (HOSPITAL_COMMUNITY): Payer: Self-pay

## 2023-04-06 ENCOUNTER — Encounter: Payer: Self-pay | Admitting: Hematology and Oncology

## 2023-04-07 ENCOUNTER — Other Ambulatory Visit: Payer: Self-pay

## 2023-04-10 ENCOUNTER — Other Ambulatory Visit: Payer: Self-pay

## 2023-04-11 ENCOUNTER — Inpatient Hospital Stay: Payer: No Typology Code available for payment source

## 2023-04-13 ENCOUNTER — Inpatient Hospital Stay: Payer: No Typology Code available for payment source | Attending: Hematology and Oncology

## 2023-04-13 ENCOUNTER — Inpatient Hospital Stay (HOSPITAL_BASED_OUTPATIENT_CLINIC_OR_DEPARTMENT_OTHER): Payer: No Typology Code available for payment source | Admitting: Adult Health

## 2023-04-13 ENCOUNTER — Other Ambulatory Visit: Payer: Self-pay

## 2023-04-13 ENCOUNTER — Encounter: Payer: Self-pay | Admitting: Adult Health

## 2023-04-13 VITALS — BP 112/62 | HR 65 | Temp 97.2°F | Resp 18 | Ht 61.0 in | Wt 191.0 lb

## 2023-04-13 DIAGNOSIS — Z170 Estrogen receptor positive status [ER+]: Secondary | ICD-10-CM | POA: Insufficient documentation

## 2023-04-13 DIAGNOSIS — R5383 Other fatigue: Secondary | ICD-10-CM | POA: Insufficient documentation

## 2023-04-13 DIAGNOSIS — Z95828 Presence of other vascular implants and grafts: Secondary | ICD-10-CM

## 2023-04-13 DIAGNOSIS — Z8616 Personal history of COVID-19: Secondary | ICD-10-CM | POA: Insufficient documentation

## 2023-04-13 DIAGNOSIS — C50412 Malignant neoplasm of upper-outer quadrant of left female breast: Secondary | ICD-10-CM | POA: Insufficient documentation

## 2023-04-13 DIAGNOSIS — Z853 Personal history of malignant neoplasm of breast: Secondary | ICD-10-CM | POA: Insufficient documentation

## 2023-04-13 DIAGNOSIS — Z9012 Acquired absence of left breast and nipple: Secondary | ICD-10-CM | POA: Insufficient documentation

## 2023-04-13 DIAGNOSIS — M545 Low back pain, unspecified: Secondary | ICD-10-CM | POA: Insufficient documentation

## 2023-04-13 DIAGNOSIS — E559 Vitamin D deficiency, unspecified: Secondary | ICD-10-CM | POA: Insufficient documentation

## 2023-04-13 DIAGNOSIS — C792 Secondary malignant neoplasm of skin: Secondary | ICD-10-CM | POA: Insufficient documentation

## 2023-04-13 DIAGNOSIS — R110 Nausea: Secondary | ICD-10-CM | POA: Insufficient documentation

## 2023-04-13 DIAGNOSIS — E781 Pure hyperglyceridemia: Secondary | ICD-10-CM | POA: Insufficient documentation

## 2023-04-13 DIAGNOSIS — E538 Deficiency of other specified B group vitamins: Secondary | ICD-10-CM | POA: Insufficient documentation

## 2023-04-13 DIAGNOSIS — Z923 Personal history of irradiation: Secondary | ICD-10-CM | POA: Insufficient documentation

## 2023-04-13 DIAGNOSIS — M7989 Other specified soft tissue disorders: Secondary | ICD-10-CM | POA: Insufficient documentation

## 2023-04-13 DIAGNOSIS — Z9221 Personal history of antineoplastic chemotherapy: Secondary | ICD-10-CM | POA: Insufficient documentation

## 2023-04-13 LAB — COMPREHENSIVE METABOLIC PANEL
ALT: 14 U/L (ref 0–44)
AST: 19 U/L (ref 15–41)
Albumin: 4.1 g/dL (ref 3.5–5.0)
Alkaline Phosphatase: 99 U/L (ref 38–126)
Anion gap: 5 (ref 5–15)
BUN: 13 mg/dL (ref 6–20)
CO2: 29 mmol/L (ref 22–32)
Calcium: 9.2 mg/dL (ref 8.9–10.3)
Chloride: 106 mmol/L (ref 98–111)
Creatinine, Ser: 0.72 mg/dL (ref 0.44–1.00)
GFR, Estimated: >60 mL/min (ref >60)
Glucose, Bld: 97 mg/dL (ref 70–99)
Potassium: 4.1 mmol/L (ref 3.5–5.1)
Sodium: 140 mmol/L (ref 135–145)
Total Bilirubin: 1 mg/dL (ref 0.3–1.2)
Total Protein: 6.6 g/dL (ref 6.5–8.1)

## 2023-04-13 LAB — CBC WITH DIFFERENTIAL/PLATELET
Abs Immature Granulocytes: 0.01 10*3/uL (ref 0.00–0.07)
Basophils Absolute: 0 10*3/uL (ref 0.0–0.1)
Basophils Relative: 0 %
Eosinophils Absolute: 0.1 10*3/uL (ref 0.0–0.5)
Eosinophils Relative: 4 %
HCT: 33.7 % — ABNORMAL LOW (ref 36.0–46.0)
Hemoglobin: 12 g/dL (ref 12.0–15.0)
Immature Granulocytes: 0 %
Lymphocytes Relative: 37 %
Lymphs Abs: 1.1 10*3/uL (ref 0.7–4.0)
MCH: 35.1 pg — ABNORMAL HIGH (ref 26.0–34.0)
MCHC: 35.6 g/dL (ref 30.0–36.0)
MCV: 98.5 fL (ref 80.0–100.0)
Monocytes Absolute: 0.3 10*3/uL (ref 0.1–1.0)
Monocytes Relative: 11 %
Neutro Abs: 1.4 10*3/uL — ABNORMAL LOW (ref 1.7–7.7)
Neutrophils Relative %: 48 %
Platelets: 155 10*3/uL (ref 150–400)
RBC: 3.42 MIL/uL — ABNORMAL LOW (ref 3.87–5.11)
RDW: 15.5 % (ref 11.5–15.5)
WBC: 2.9 10*3/uL — ABNORMAL LOW (ref 4.0–10.5)
nRBC: 0 % (ref 0.0–0.2)

## 2023-04-13 MED ORDER — HEPARIN SOD (PORK) LOCK FLUSH 100 UNIT/ML IV SOLN
500.0000 [IU] | Freq: Once | INTRAVENOUS | Status: AC
  Administered 2023-04-13: 500 [IU]

## 2023-04-13 MED ORDER — SODIUM CHLORIDE 0.9% FLUSH
10.0000 mL | Freq: Once | INTRAVENOUS | Status: AC
  Administered 2023-04-13: 10 mL

## 2023-04-13 NOTE — Assessment & Plan Note (Signed)
Naydeli is a 47 year old woman with breast cancer that is spread to her skin.  She is on capecitabine twice daily 2 weeks on and 1 week off.  Bobbye is tolerating her treatment well and denies any significant issues from taking it.  Her labs today are stable.  Per Dr. Al Pimple she is to follow-up in 3 weeks for labs and an office visit.  I reviewed with Jaquaya the working plan is for her to be seen every 3 weeks with labs before hand and that she will continue the Cytovene until the end of September at least.  I recommended that she continue to use moisturizing cream for her hands and feet as well as taking Tylenol or Advil for her back pain.  Recommended stretching or yoga for her back pain.  Should her back pain persist--I instructed Tonita to let us know so we can get some x-ray imaging to evaluate further.  Jennetta is recommended to return in 3 weeks for lab flush and follow-up.  She knows to call for any questions or concerns between now and her next appointment with [place].

## 2023-04-13 NOTE — Progress Notes (Signed)
[facility] Cancer Follow up:    Monica Andrew, NP [address] [place] [address]   DIAGNOSIS:  Cancer Staging  Malignant neoplasm of upper-outer quadrant of left breast in female, estrogen receptor positive (HCC) Staging form: Breast, AJCC 8th Edition - Clinical stage from 01/29/2020: Stage IIIA (cT2, cN1, cM0, G3, ER+, PR-, HER2-) - Signed by Rachel Moulds, MD on 06/28/2022 Stage prefix: Initial diagnosis Histologic grading system: 3 grade system   SUMMARY OF ONCOLOGIC HISTORY: Oncology History  Malignant neoplasm of upper-outer quadrant of left breast in female, estrogen receptor positive (HCC)  01/23/2020 Initial Diagnosis   [place] woman status post left breast upper outer quadrant biopsy 01/23/2020 for a clinical T3 N1, 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             (a) chest CT scan and bone scan 02/07/2020 showed no evidence of metastatic disease   01/29/2020 Cancer Staging   Staging form: Breast, AJCC 8th Edition - Clinical stage from 01/29/2020: Stage IIIA (cT2, cN1, cM0, G3, ER+, PR-, HER2-) - Signed by Rachel Moulds, MD on 06/28/2022 Stage prefix: Initial diagnosis Histologic grading system: 3 grade system   02/07/2020 Genetic Testing   Negative genetic testing. No pathogenic variants identified on the [facility] Common Hereditary Cancers Panel. VUS in MSH6 called c.2744C>G identified. The report date is 02/07/2020.  The Common Hereditary Cancers Panel offered by [facility] includes sequencing and/or deletion duplication testing of the following 48 genes: APC, ATM, AXIN2, BARD1, BMPR1A, BRCA1, BRCA2, BRIP1, CDH1, CDKN2A (p14ARF), CDKN2A (p16INK4a), CKD4, CHEK2, CTNNA1, DICER1, EPCAM (Deletion/duplication testing only), GREM1 (promoter region deletion/duplication testing only), KIT, MEN1, MLH1, MSH2, MSH3, MSH6, MUTYH, NBN, NF1, NHTL1, PALB2, PDGFRA, PMS2, POLD1, POLE, PTEN, RAD50,  RAD51C, RAD51D, RNF43, SDHB, SDHC, SDHD, SMAD4, SMARCA4. STK11, TP53, TSC1, TSC2, and VHL.  The following genes were evaluated for sequence changes only: SDHA and HOXB13 c.251G>A variant only.  UPDATE: MSH6 c.2744C>G (p.Ala915Gly) VUS has been amended to Benign. Amended report date is 02/13/2023.   02/12/2020 - 05/28/2020 Neo-Adjuvant Chemotherapy   neoadjuvant chemotherapy consisting of doxorubicin and cyclophosphamide in dose dense fashion x4 started 02/12/2020, completed 03/25/2020, followed by paclitaxel and carboplatin weekly x12 started 04/08/2020 and completed on 05/28/2020             (a) echo 02/07/2020 shows an ejection fraction in the 55-60% range             (b) Epirubicin substituted for Doxorubicin starting with cycle 2 of AC secondary to allergic rash from Doxorubicin   07/15/2020 Surgery   status post left lumpectomy and sentinel lymph node sampling 07/15/2020 for a ypT1b ypN0 residual invasive ductal carcinoma, grade 3, with negative margins.             (a) a total of 4 left axillary lymph nodes were removed             (b) repeat prognostic panel on the final pathology again triple negative, with an MIB-1 of 50%.   08/26/2020 - 10/09/2020 Radiation Therapy   adjuvant radiation completed 10/09/2020             (a) sensitizing capecitabine attempted but not tolerated by the patient  08/26/2020 through 10/09/2020 Site Technique Total Dose (Gy) Dose per Fx (Gy) Completed Fx Beam Energies  Breast, Left: Breast_Lt 3D 50/50 2 25/25 15X  Breast, Left: Breast_Lt_PAB_SCV 3D 50/50 2 25/25 10X, 15X  Breast, Left:  Breast_Lt_Bst 3D 10/10 2 5/5 6X, 10X     11/23/2020 - 01/04/2021 Adjuvant Chemotherapy   adjuvant pembrolizumab started 11/23/2020, to continue for 1 year             (a) discontinued after 01/04/2021 dose with multiple side effects requiring steroids for resolution   03/30/2022 Mammogram   Mammogram and ultrasound show numerous masses throughout left breast not visualized in  01/2022, +left axillary lymphadenopathy, skin thickening in right breast and in upper abdomen just below sternum   04/05/2022 - 07/20/2022 Chemotherapy   Patient is on Treatment Plan : BREAST METASTATIC Fam-Trastuzumab Deruxtecan-nxki (Enhertu) (5.4) q21d     08/21/2022 Surgery   Left breast mastectomy and lymph node biopsy shows no residual carcinoma.     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  Prognostic panel sent to [facility]: Left breast skin, punch biopsy: Skin with dermal lymphatic involvement by carcinoma (confirmed with outside CK7 IHC, reviewed). Biomarkers are reported as negative for ER, PR, Her2/neu (confirmed on review but please note sample size is extremely small).   02/15/2022 - 03/08/2022 Chemotherapy    Taxotere/Cytoxan x 2, discontinued due to progression of breast.   03/30/2022 Mammogram   Mammogram and ultrasound show numerous masses throughout left breast not visualized in 01/2022, +left axillary lymphadenopathy, skin thickening in right breast and in upper abdomen just below sternum   04/05/2022 - 07/20/2022 Chemotherapy   Patient is on Treatment Plan : BREAST METASTATIC Fam-Trastuzumab Deruxtecan-nxki (Enhertu) (5.4) q21d     11/25/2022 -  Chemotherapy   Patient is on Treatment Plan : BREAST Capecitabine q21d       CURRENT THERAPY: Capecitabine  INTERVAL HISTORY: [name] 47 y.o. female returns accompanied by Spanish interpreter for follow-up while taking capecitabine.  1500mg  po q am and 1000mg  q pm 14 days on and 7 days off.  She is tolerating treatment well.  She has occasional nausea but denies vomiting or diarrhea.  She notes some erythema on the soles of her feet and slight dryness to the palms of her hands and soles of her feet but otherwise is tolerating the treatment  well.  She does have some mild fatigue as well.  Occasional pain noted in her lower back.  She denies any focal weakness in her lower extremities, bowel/bladder changes, or numbness/tingling.    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) 04/01/2022   Encounter for dental examination 03/11/2022   Acute apical periodontitis 03/11/2022   Phobia of dental procedure 03/11/2022   Defective dental restoration 03/11/2022   Accretions on teeth 03/11/2022   Chronic periodontitis 03/11/2022   Teeth missing 03/11/2022   Caries 03/11/2022   Generalized gingival recession 03/11/2022   Pain, dental 03/08/2022   Cancer-related pain 03/08/2022   Port-A-Cath in place 02/15/2022   Oral allergy syndrome, subsequent encounter 02/03/2022   Adverse effect of other drugs, medicaments and biological substances, subsequent encounter 02/02/2022   Other adverse food reactions, not elsewhere classified, subsequent encounter 02/02/2022   Chest tightness 02/02/2022  Other allergic rhinitis 02/02/2022   Allergic conjunctivitis of both eyes 02/02/2022   Language barrier 12/11/2020   Morbid obesity (HCC)    History of breast cancer    Genetic testing 02/13/2020   Malignant neoplasm of upper-outer quadrant of left breast in female, estrogen receptor positive (HCC) 01/27/2020   Impaired ability to use community resources due to language barrier 07/13/2013    is allergic to omnipaque [iohexol], doxorubicin hcl, keytruda [pembrolizumab], other, tape, and wound dressing adhesive.  MEDICAL HISTORY: Past Medical History:  Diagnosis Date   Allergy    Asthma    Breast cancer in female [facility])    Left   Breast disorder    breast cancer May  2021   Chronic back pain    Headache    High triglycerides    History of breast cancer    History of COVID-19 04/20/2021   Hx of migraines    Morbid obesity (HCC)    Personal history of chemotherapy    Personal history of radiation therapy     SURGICAL HISTORY: Past Surgical History:  Procedure Laterality Date   BREAST LUMPECTOMY     BREAST LUMPECTOMY WITH RADIOACTIVE SEED AND SENTINEL LYMPH NODE BIOPSY Left 07/15/2020   Procedure: LEFT BREAST LUMPECTOMY WITH RADIOACTIVE SEED AND LEFT AXILLARY SENTINEL LYMPH NODE BIOPSY, LEFT AXILLARY NODE RADIOACTIVE SEED GUIDED EXCISION, LEFT BREAST RADIOACTIVE SEED GUIDED EXCISION IM NODE;  Surgeon: Emelia Loron, MD;  Location: [facility] OR;  Service: General;  Laterality: Left;  PEC BLOCK   CESAREAN SECTION WITH BILATERAL TUBAL LIGATION Bilateral 07/23/2015   Procedure: CESAREAN SECTION WITH BILATERAL TUBAL LIGATION;  Surgeon: Reva Bores, MD;  Location: [facility];  Service: Obstetrics;  Laterality: Bilateral;   IR IMAGING GUIDED PORT INSERTION  02/11/2022   MODIFIED MASTECTOMY Left 08/22/2022   Procedure: LEFT MODIFIED RADICAL MASTECTOMY, LEFT AXILLARY NODE DISSECTION, AND SKIN BIOPSY OF BACK;  Surgeon: Emelia Loron, MD;  Location: [facility];  Service: General;  Laterality: Left;   PORT-A-CATH REMOVAL N/A 04/30/2021   Procedure: REMOVAL PORT-A-CATH;  Surgeon: Emelia Loron, MD;  Location: [facility];  Service: General;  Laterality: N/A;   PORTACATH PLACEMENT Right 02/11/2020   Procedure: INSERTION PORT-A-CATH WITH ULTRASOUND GUIDANCE;  Surgeon: Emelia Loron, MD;  Location: [facility];  Service: General;  Laterality: Right;   TUBAL LIGATION      SOCIAL HISTORY: Social History   Socioeconomic History   Marital status: Legally Separated    Spouse name: Not on file   Number of children: 3   Years of education: Not on file   Highest education level: 9th grade  Occupational History   Occupation:  Holiday representative  Tobacco Use   Smoking status: Never   Smokeless tobacco: Never  Vaping Use   Vaping status: Never Used  Substance and Sexual Activity   Alcohol use: No   Drug use: Never   Sexual activity: Yes    Birth control/protection: Surgical  Other Topics Concern   Not on file  Social History Narrative   Not on file   Social Determinants of Health   Financial Resource Strain: Not on file  Food Insecurity: No Food Insecurity (10/11/2022)   Hunger Vital Sign    Worried About Running Out of Food in the Last Year: Never true    Ran Out of Food in the Last Year: Never true  Transportation Needs: No Transportation Needs (10/11/2022)   PRAPARE - Administrator, Civil Service (Medical): No  Lack of Transportation (Non-Medical): No  Physical Activity: Not on file  Stress: Not on file  Social Connections: Not on file  Intimate Partner Violence: Not At Risk (10/11/2022)   Humiliation, Afraid, Rape, and Kick questionnaire    Fear of Current or Ex-Partner: No    Emotionally Abused: No    Physically Abused: No    Sexually Abused: No    FAMILY HISTORY: Family History  Problem Relation Age of Onset   Heart disease Maternal Grandmother     Review of Systems  Constitutional:  Positive for fatigue. Negative for appetite change, chills, fever and unexpected weight change.  HENT:   Negative for hearing loss, lump/mass and trouble swallowing.   Eyes:  Negative for eye problems and icterus.  Respiratory:  Negative for chest tightness, cough and shortness of breath.   Cardiovascular:  Negative for chest pain, leg swelling and palpitations.  Gastrointestinal:  Positive for nausea. Negative for abdominal distention, abdominal pain, constipation, diarrhea and vomiting.  Endocrine: Negative for hot flashes.  Genitourinary:  Negative for difficulty urinating.   Musculoskeletal:  Positive for back pain. Negative for arthralgias.  Skin:  Negative for itching and rash.   Neurological:  Negative for dizziness, extremity weakness, headaches and numbness.  Hematological:  Negative for adenopathy. Does not bruise/bleed easily.  Psychiatric/Behavioral:  Negative for depression. The patient is not nervous/anxious.       PHYSICAL EXAMINATION   Onc Performance Status - 04/13/23 0923       ECOG Perf Status   ECOG Perf Status Fully active, able to carry on all pre-disease performance without restriction      KPS SCALE   KPS % SCORE Able to carry on normal activity, minor s/s of disease             Vitals:   04/13/23 0917  BP: 112/62  Pulse: 65  Resp: 18  Temp: (!) 97.2 F (36.2 C)  SpO2: 97%    Physical Exam Constitutional:      General: She is not in acute distress.    Appearance: Normal appearance. She is not toxic-appearing.  HENT:     Head: Normocephalic and atraumatic.     Mouth/Throat:     Mouth: Mucous membranes are moist.     Pharynx: Oropharynx is clear. No oropharyngeal exudate or posterior oropharyngeal erythema.  Eyes:     General: No scleral icterus. Cardiovascular:     Rate and Rhythm: Normal rate and regular rhythm.     Pulses: Normal pulses.     Heart sounds: Normal heart sounds.  Pulmonary:     Effort: Pulmonary effort is normal.     Breath sounds: Normal breath sounds.  Abdominal:     General: Abdomen is flat. Bowel sounds are normal. There is no distension.     Palpations: Abdomen is soft.     Tenderness: There is no abdominal tenderness.  Musculoskeletal:        General: No swelling.     Cervical back: Neck supple.  Lymphadenopathy:     Cervical: No cervical adenopathy.  Skin:    General: Skin is warm and dry.     Findings: No rash.  Neurological:     General: No focal deficit present.     Mental Status: She is alert.  Psychiatric:        Mood and Affect: Mood normal.        Behavior: Behavior normal.     LABORATORY DATA:  CBC    Component Value  Date/Time   WBC 2.9 (L) 04/13/2023 0900   RBC  3.42 (L) 04/13/2023 0900   HGB 12.0 04/13/2023 0900   HGB 11.6 (L) 07/20/2022 0856   HGB 13.3 10/01/2021 1004   HCT 33.7 (L) 04/13/2023 0900   HCT 40.4 10/01/2021 1004   PLT 155 04/13/2023 0900   PLT 220 07/20/2022 0856   PLT 212 10/01/2021 1004   MCV 98.5 04/13/2023 0900   MCV 87 10/01/2021 1004   MCH 35.1 (H) 04/13/2023 0900   MCHC 35.6 04/13/2023 0900   RDW 15.5 04/13/2023 0900   RDW 13.3 10/01/2021 1004   LYMPHSABS 1.1 04/13/2023 0900   LYMPHSABS 1.6 10/01/2021 1004   MONOABS 0.3 04/13/2023 0900   EOSABS 0.1 04/13/2023 0900   EOSABS 0.1 10/01/2021 1004   BASOSABS 0.0 04/13/2023 0900   BASOSABS 0.0 10/01/2021 1004    CMP     Component Value Date/Time   NA 140 04/13/2023 0900   NA 138 09/30/2022 1149   K 4.1 04/13/2023 0900   CL 106 04/13/2023 0900   CO2 29 04/13/2023 0900   GLUCOSE 97 04/13/2023 0900   BUN 13 04/13/2023 0900   BUN 11 09/30/2022 1149   CREATININE 0.72 04/13/2023 0900   CREATININE 0.78 12/23/2022 0959   CALCIUM 9.2 04/13/2023 0900   PROT 6.6 04/13/2023 0900   PROT 7.1 09/30/2022 1149   ALBUMIN 4.1 04/13/2023 0900   ALBUMIN 4.5 09/30/2022 1149   AST 19 04/13/2023 0900   AST 18 12/23/2022 0959   ALT 14 04/13/2023 0900   ALT 13 12/23/2022 0959   ALKPHOS 99 04/13/2023 0900   BILITOT 1.0 04/13/2023 0900   BILITOT 0.5 12/23/2022 0959   GFRNONAA >60 04/13/2023 0900   GFRNONAA >60 12/23/2022 0959   GFRAA >60 06/03/2020 0949   GFRAA >60 02/26/2020 0915      ASSESSMENT and THERAPY PLAN:   Malignant neoplasm of upper-outer quadrant of left breast in female, estrogen receptor positive (HCC) Christella is a 47 year old woman with breast cancer that is spread to her skin.  She is on capecitabine twice daily 2 weeks on and 1 week off.  Creta is tolerating her treatment well and denies any significant issues from taking it.  Her labs today are stable.  Per Dr. Al Pimple she is to follow-up in 3 weeks for labs and an office visit.  I reviewed with Jillann the  working plan is for her to be seen every 3 weeks with labs before hand and that she will continue the Cytovene until the end of September at least.  I recommended that she continue to use moisturizing cream for her hands and feet as well as taking Tylenol or Advil for her back pain.  Recommended stretching or yoga for her back pain.  Should her back pain persist--I instructed Elim to let us know so we can get some x-ray imaging to evaluate further.  Daren is recommended to return in 3 weeks for lab flush and follow-up.  She knows to call for any questions or concerns between now and her next appointment with [place].    All questions were answered. The patient knows to call the clinic with any problems, questions or concerns. We can certainly see the patient much sooner if necessary.  Total encounter time:30 minutes*in face-to-face visit time, chart review, lab review, care coordination, order entry, and documentation of the encounter time.    Lillard Anes, NP 04/13/23 10:04 AM Medical Oncology and Hematology [facility] [address]  [place], [place] [address] Tel. [phone]    Fax. [phone]  *Total Encounter Time as defined by the Centers for Medicare and Medicaid Services includes, in addition to the face-to-face time of a patient visit (documented in the note above) non-face-to-face time: obtaining and reviewing outside history, ordering and reviewing medications, tests or procedures, care coordination (communications with other health care professionals or caregivers) and documentation in the medical record.

## 2023-04-15 ENCOUNTER — Other Ambulatory Visit: Payer: Self-pay

## 2023-04-18 ENCOUNTER — Other Ambulatory Visit: Payer: Self-pay

## 2023-04-19 ENCOUNTER — Ambulatory Visit: Payer: No Typology Code available for payment source | Admitting: Physical Therapy

## 2023-04-24 ENCOUNTER — Encounter: Payer: Self-pay | Admitting: Physical Therapy

## 2023-04-24 ENCOUNTER — Ambulatory Visit: Payer: No Typology Code available for payment source | Attending: Hematology and Oncology | Admitting: Physical Therapy

## 2023-04-24 DIAGNOSIS — R293 Abnormal posture: Secondary | ICD-10-CM

## 2023-04-24 DIAGNOSIS — Z170 Estrogen receptor positive status [ER+]: Secondary | ICD-10-CM

## 2023-04-24 DIAGNOSIS — M546 Pain in thoracic spine: Secondary | ICD-10-CM

## 2023-04-24 DIAGNOSIS — Z483 Aftercare following surgery for neoplasm: Secondary | ICD-10-CM

## 2023-04-24 DIAGNOSIS — M6281 Muscle weakness (generalized): Secondary | ICD-10-CM

## 2023-04-24 DIAGNOSIS — M25612 Stiffness of left shoulder, not elsewhere classified: Secondary | ICD-10-CM

## 2023-04-24 DIAGNOSIS — C50412 Malignant neoplasm of upper-outer quadrant of left female breast: Secondary | ICD-10-CM | POA: Insufficient documentation

## 2023-04-24 DIAGNOSIS — I972 Postmastectomy lymphedema syndrome: Secondary | ICD-10-CM

## 2023-04-24 NOTE — Therapy (Signed)
OUTPATIENT PHYSICAL THERAPY ONCOLOGY TREATMENT  Patient Name: [name] MRN: [id number] DOB:[birth year], 47 y.o., female Today's Date: 04/24/2023  END OF SESSION:  PT End of Session - 04/24/23 0917     Visit Number 28    Number of Visits 35    Date for PT Re-Evaluation 04/04/23    PT Start Time 0916    PT Stop Time 0940    PT Time Calculation (min) 24 min    Activity Tolerance Patient tolerated treatment well    Behavior During Therapy [facility] for tasks assessed/performed                       Past Medical History:  Diagnosis Date   Allergy    Asthma    Breast cancer in female [facility])    Left   Breast disorder    breast cancer May 2021   Chronic back pain    Headache    High triglycerides    History of breast cancer    History of COVID-19 04/20/2021   Hx of migraines    Morbid obesity (HCC)    Personal history of chemotherapy    Personal history of radiation therapy    Past Surgical History:  Procedure Laterality Date   BREAST LUMPECTOMY     BREAST LUMPECTOMY WITH RADIOACTIVE SEED AND SENTINEL LYMPH NODE BIOPSY Left 07/15/2020   Procedure: LEFT BREAST LUMPECTOMY WITH RADIOACTIVE SEED AND LEFT AXILLARY SENTINEL LYMPH NODE BIOPSY, LEFT AXILLARY NODE RADIOACTIVE SEED GUIDED EXCISION, LEFT BREAST RADIOACTIVE SEED GUIDED EXCISION IM NODE;  Surgeon: Emelia Loron, MD;  Location: [facility] OR;  Service: General;  Laterality: Left;  PEC BLOCK   CESAREAN SECTION WITH BILATERAL TUBAL LIGATION Bilateral 07/23/2015   Procedure: CESAREAN SECTION WITH BILATERAL TUBAL LIGATION;  Surgeon: Reva Bores, MD;  Location: [facility] ORS;  Service: Obstetrics;  Laterality: Bilateral;   IR IMAGING GUIDED PORT INSERTION  02/11/2022   MODIFIED MASTECTOMY Left 08/22/2022   Procedure: LEFT MODIFIED RADICAL MASTECTOMY, LEFT AXILLARY NODE DISSECTION, AND SKIN BIOPSY OF BACK;  Surgeon: Emelia Loron, MD;  Location: [facility] ORS;  Service: General;  Laterality: Left;   PORT-A-CATH REMOVAL  N/A 04/30/2021   Procedure: REMOVAL PORT-A-CATH;  Surgeon: Emelia Loron, MD;  Location: [facility];  Service: General;  Laterality: N/A;   PORTACATH PLACEMENT Right 02/11/2020   Procedure: INSERTION PORT-A-CATH WITH ULTRASOUND GUIDANCE;  Surgeon: Emelia Loron, MD;  Location: [facility];  Service: General;  Laterality: Right;   TUBAL LIGATION     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) 04/01/2022   Encounter for dental examination 03/11/2022   Acute apical periodontitis 03/11/2022   Phobia of dental procedure 03/11/2022   Defective dental restoration 03/11/2022   Accretions on teeth 03/11/2022   Chronic periodontitis 03/11/2022   Teeth missing 03/11/2022   Caries 03/11/2022   Generalized gingival recession 03/11/2022   Pain, dental 03/08/2022   Cancer-related pain 03/08/2022   Port-A-Cath in place 02/15/2022   Oral allergy syndrome, subsequent encounter 02/03/2022   Adverse effect of other drugs, medicaments and biological substances, subsequent encounter 02/02/2022   Other adverse food reactions, not elsewhere classified, subsequent encounter 02/02/2022   Chest  tightness 02/02/2022   Other allergic rhinitis 02/02/2022   Allergic conjunctivitis of both eyes 02/02/2022   Language barrier 12/11/2020   Morbid obesity (HCC)    History of breast cancer    Genetic testing 02/13/2020   Malignant neoplasm of upper-outer quadrant of left breast in female, estrogen receptor positive (HCC) 01/27/2020   Impaired ability to use community resources due to language barrier 07/13/2013    PCP: Angus Seller, NP  REFERRING PROVIDER: Rachel Moulds, MD  REFERRING DIAG: Left UE  lymphedema  THERAPY DIAG:  Postmastectomy lymphedema  Stiffness of left shoulder, not elsewhere classified  Aftercare following surgery for neoplasm  Abnormal posture  Muscle weakness (generalized)  Pain in thoracic spine  Malignant neoplasm of upper-outer quadrant of left breast in female, estrogen receptor positive (HCC)  ONSET DATE: 08/01/22  Rationale for Evaluation and Treatment: Rehabilitation  SUBJECTIVE:                                                                                                                                                                                           SUBJECTIVE STATEMENT:  I was gone for a week in [place] and I forgot my garment. The new garment is tighter.   PERTINENT HISTORY:  Patient was diagnosed on 01/23/2020 with left grade III invasive ductal carcinoma breast cancer. It is ER positive, PR negative, and HER2 negative , but functionally Triple Negative,with a Ki67 of 40%. Patient with interpreter present. She underwent neoadjuvant chemotherapy from 02/12/2020 - 05/28/2020. 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 She underwent a left lumpectomy and sentinel node biopsy ( 4 negative nodes) on 07/15/2020. She underwent radiation and anti-estrogen therapy.  She had interval development of diffuse skin thickening in 2023. She had a punch biopsy of her skin at that time and this was consistent with inflammatory breast cancer. She has been treated with chemo since then. She had a left modified radical mastectomy on 08/22/2022 with complete therapeutic response and 0/1 LN. She noted some swelling in her left arm and on 08/25/2022 she had an [place] that was negative for DVT. Her SOZO screen on 09/19/2021 was 6.9 points above baseline putting her in the yellow zone and she was given a compression sleeve that was not containing  her so a flat knit sleeve and glove and nighttime garment  have been ordered   PAIN:  Are you having pain?  No  PRECAUTIONS: Other: lymphedema [name]  WEIGHT BEARING RESTRICTIONS: No  FALLS:  Has patient fallen in last 6 months? 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 Fullness in left lateral chest and back as well as left arm and hand especially on thumb side   OBSERVATIONS / OTHER ASSESSMENTS: scar well healed, increased edema at lateral trunk and throughout [name] reddened area in radiation field with skin appearing fragile and irritated but no open areas   POSTURE: forward head, rounded shoulders  UPPER EXTREMITY AROM/PROM:  A/PROM RIGHT   eval   Shoulder extension 68  Shoulder flexion 160  Shoulder abduction 175  Shoulder internal rotation 56  Shoulder external rotation 88    (Blank rows = not tested)  A/PROM LEFT   eval LEFT 11/10/22 LEFT 12/06/22 LEFT 12/08/2022 LEFT 12/19/2022 LEFT 01/23/23 LEFT 02/07/23  Shoulder extension 52        Shoulder flexion 146 150 156 155 165 162 162  Shoulder abduction 148 160 164 170 170 172 175  Shoulder internal rotation 67        Shoulder external rotation 86          (Blank rows = not tested)   LYMPHEDEMA ASSESSMENTS:  SURGERY TYPE/DATE: Left Lumpectomy with SLNB11/11/2019, Left modified mastectomy with left axillary node dissection NUMBER OF LYMPH NODES REMOVED: 0/4 with lumpectomy, 0/1 during  Mastectomy CHEMOTHERAPY: yes RADIATION:Yes HORMONE TREATMENT: may require INFECTIONS: none  LYMPHEDEMA ASSESSMENTS:  LANDMARK RIGHT  eval RIGHT 11/08/22  10 cm proximal to olecranon process 31.4 34.4  Olecranon process 27.2 28  10  cm proximal to ulnar styloid process 25.7 25.5  Just proximal to ulnar styloid process 18.4 19  Across hand at thumb web space 20 20  At base of 2nd digit 6.4 6.5  (Blank rows = not tested)  LANDMARK LEFT  eval LEFT 10/25/22 Left  11/03/22 LEFT 11/08/22 LEFT  12/08/2022  LEFT 02/07/23  10 cm proximal to olecranon process 33 33 31.9 33 34.3 33  Olecranon process 28.4 28.5 28.5 29 28.7 28.1  10 cm proximal to ulnar styloid process 26 26.5 26.4 26.6 26.5 26  Just proximal to ulnar styloid process 19.4 19 18.9 19.1 18.2 18.5  Across hand at thumb web space 21 20.5 19.7 20.7 19.5 20.4  2nd digit    6.7 6.5 6.4   TODAY'S TREATMENT:   04/24/23 Assessed fit of new CircAid profile night time garment. Hand part fit much better since the remake. Pt has been out of state for the last week but did not travel with her garments. Educated pt on importance of wearing compression garments 24/7 especially when flying. Educated pt that she can wear her CircAid during the day since pt reports it is easier for her to don/doff and may increase compliance. Encouraged pt to reach back out to [employer] since they recently sent her a letter stating they have not been able to reach her.  03/07/23: Assessed remake of custom flat knit glove and it fit great. Assessed pt's night time garment for fit. Her custom circaid profile night time garment was  made off the same measurements as her flat knit glove which was too large. Her night time garment is too large at the hand and she has been placing additional foam in it but it has not helped much. Her hand circumference is up 2 cm. Remeasured to see if she would fit in to an off the shelf garment but her upper arm was out of the size range so  will need custom remade. Remeasured for custom circaid profile today and compared to original measurements. The original measurements are 2 cm larger at wrist and elbow. Will reach out to company for a remake. Also discussed [employer] pump and status with that. Pt reports that she has been unable to get it due to not having official proof of income.  02/07/23:  Remeasured ROM and circumferences Assessed progress towards goals Sent email to [facility] regarding financial application which pt filled out and therapist faxed and got confirmation that it was received but she just received it again Discussed pt's progress towards goals and answered pt's questions about lingering numbness/discomfort in axillary region - educated this is normal post surgery Encouraged pt to follow up with foot doctor regarding pain in toe  Discussed pt following up with ortho once she finishes chemo since current back exercsies have not helped  01/23/23 Remeasured ROM Pelvic tilts with 5 sec holds x 10 reps Pelvic tilts with gentle alternating hip abduction in hook lying x 10 reps Pelvic tilts with alternating marching in hook lying x 10 reps Standing with back against wall with core engaged and shoulders back -3 way shoulder with 1 lb weight with pt reporting difficulty with this and back pain Educated pt about why exercising can help back pain and how tight pecs and weak back muscles causing increased back pain and the role of core muscles in helping support the low back Supine horizontal abduction with yellow band x 10 reps Supine overhead flexion with yellow band x 10 reps No back pain by end of session  01/13/23: Discussed how current glove is not managing her swelling. Discussed her current schedule for wearing her night time and day time garment. When pt takes off her night time garment she does her morning routine prior to donning day time garment. Educated pt to immediately don day time garment. Pt reports it is  difficult to get ready, get her kids ready and cook breakfast without soiling glove so issued her disposable gloves to don over her glove. Also, her current glove is too loose and there is fabric at back of glove that can be pinched. Emailed [employer] about a Therapist, music. Educated pt that she can still bandage if she feels her hand is swelling. Began AAROM exercises today: Pulleys x 2 min in direction of flexion and abduction with v/c to decrease scapular compensation Ball up wall x 10 reps in direction of flexion and abduction with pt returning therapist demo - pt reported back pain with this so educated pt on importance of posture and need for core strength to protect back Pelvic tilts with max v/c and t/c initially and therapist demo with pt able to return demo by end of session - issued this as part of HEP  01/03/23: Demonstrated correct way to don/doff compression sleeve and glove. Had pt return demonstrate. Sleeve fit very well. Educated pt on care instructions and educated her to wear her sleeve during the day and apply compression bandages at night.   12/28/2022  MLD: Short neck, L 5 diaphragmatic  breaths, bil axillary nodes and establishment of interaxillary pathway, Lt inguinal nodes, Lt axillo-inguinal anastomosis spending increased time in area of fullness inferior to axilla, L UE working proximal to distal, then retracing all steps.  Educated pt about availability of compression tank tops and educated her that this may help since her compression bra has not been helping lateral trunk edema and digs in right below her swelling Compression Bandaging: TG soft from hand to axilla,  mollelast to digits 1-4 with  , artiflex from hand to axilla, 1 6 cm bandage at hand, 1 8 cm bandage from wrist to axilla with x at antecubital fossa and 1 12 cm bandage from wrist to axilla   12/21/2022: Rep from [employer] in clinic today. Arrangements were made for rep to come to our clinic at 11:00 on 01/03/2023 to do the  demonstration for the [employer]ouch and fill out the needed paperwork to see if pt qualifies for a donated garment. Also checked on the sleeve as the [employer] portal says that they are waiting for payment and pt given information to contact them about it.  Pt askes for MLD only as she cannot do that at home so it was performed as on 12/19/2022 with extra time and stretch to anterior chest and MLD at fullness at anterior and posterior axilla.    12/19/2022  MLD: Short neck, L 5 diaphragmatic breaths, bil axillary nodes and establishment of interaxillary pathway, Lt inguinal nodes, Lt axillo-inguinal anastomosis L UE working proximal to distal especially at thumb side of hand with arm elevated. then retracing all steps. Then to sidelying for more prolonged work on posterior interaxillary anastamosis with gentle pressure from one hand on lateral chest and axilla. In sidelying, small circles with hand pointed to ceiling, shoulder flexion and external rotation. X 10  Talked with pt about [employer] but she says she has not heard from them yet, but may have not picked up their call.  Gave pt [employer] card and asked to call them to check on it herself ( with assist from English speaking family) Reinforced scar massage and skin stretch at anterior chest since she is healed from radiation   12/15/2022  MLD: Short neck, L 5 diaphragmatic breaths, bil axillary nodes and establishment of interaxillary pathway, Lt inguinal nodes, Lt axillo-inguinal anastomosis avoiding radiation field tenderness, L UE working proximal to distal, then retracing all steps. Then to sidelying for more prolonged work on Merchandiser, retail. supine dowel stretch in flexion and abduction In sidelying, small circles with hand pointed to ceiling,x 5  short range abduction abduction with straight arm, shoulder flexion and external rotation. X 10  Compression Bandaging: TG soft from hand to axilla,  mollelast to digits 1-5 with  , gray foam  over dorsum of hand and wrist, artiflex from hand to axilla, 1 6 cm bandage at hand, 1 8 cm bandage from wrist to axilla with x at antecubital fossa and 1 12 cm bandage from wrist to axilla   12/06/2022 MLD: Short neck, L 5 diaphragmatic breaths, R axillary nodes and establishment of interaxillary pathway, Lt inguinal nodes, Lt axillo-inguinal anastomosis avoiding radiation field, L UE working proximal to distal, then retracing all steps.  PROM to L shoulder gentle in to flexion and abduction making sure to avoid any pulling at fragile skin Compression Bandaging: TG soft from hand to axilla,  mollelast to digits 1-4 with  , artiflex from hand to axilla, 1 6 cm bandage at hand, 1 8 cm bandage from  wrist to axilla in herringbone fashion and 1 12 cm bandage from wrist to axilla   12/01/2022 MLD: Short neck, L 5 diaphragmatic breaths, R axillary nodes and establishment of interaxillary pathway, Lt inguinal nodes, Lt axillo-inguinal anastomosis avoiding radiation field, L UE working proximal to distal, then to R s/l to work on L lateral trunk moving fluid towards pathways then back to supine retracing all steps.  PROM to L shoulder gentle in to flexion and abduction making sure to avoid any pulling at fragile skin  11/29/2022 MLD: Short neck, L 5 diaphragmatic breaths, R axillary nodes and establishment of interaxillary pathway, Lt inguinal nodes, Lt axillo-inguinal anastomosis avoiding radiation field, L UE working proximal to distal, then to R s/l to work on L lateral trunk moving fluid towards pathways then back to supine retracing all steps.  PROM to L shoulder gentle in to flexion and abduction making sure to avoid any pulling at fragile skin Compression Bandaging: TG soft from hand to axilla,  mollelast to digits 1-5 with  , gray foam over dorsum of hand and wrist, artiflex from hand to axilla, 1 6 cm bandage at hand, 1 8 cm bandage from wrist to axilla with x at antecubital fossa and 1 12 cm bandage  from wrist to axilla    11/24/2022 MLD: Short neck, Lt inguinal nodes, Lt axillo-inguinal anastomosis, avoiding radiation field, L UE working proximal to distal then retracing all steps. Did not work on lateral trunk due to compromised skin from radiation Compression Bandaging: TG soft from hand to axilla,  mollelast to digits 1-5 with  , gray foam over dorsum of hand and wrist, artiflex from hand to axilla, 1 6 cm bandage at hand, 1 8 cm bandage from wrist to axilla with x at antecubital fossa and 1 12 cm bandage from wrist to axilla                                                                                                                                  PATIENT EDUCATION:  Education details:how weak/overstretched back muscles with tight pecs can cause back pain, how weak core can cause back pain Person educated: Patient Education method: Actor given  Education comprehension: verbalized understanding  HOME EXERCISE PROGRAM: Obtain compression bra, continue to wear sleeve and glove Pelvic tilts x 10 reps each with 5 sec holds  Add hip abduction with pelvic tilt  Add marching with pelvic tilt Supine overhead flexion and horizontal abduction with yellow band  ASSESSMENT:  CLINICAL IMPRESSION: Assessed remake of custom CircAid night time garment and it fit great. She has been in [place] for the last week and did not have her garments so her hand was visibly swollen today but pt reports it decreases when she wears her garment. Educated pt on importance of wearing garments 24/7. Educated pt that she can wear CircAid profile during day time hours since pt feels it is easier to  be compliant with it. She has now met all goals for therapy and will be discharged at this time.   OBJECTIVE IMPAIRMENTS: decreased knowledge of condition, decreased ROM, decreased strength, increased edema, increased fascial restrictions, impaired UE functional use, postural dysfunction, and pain.   ACTIVITY  LIMITATIONS: carrying, lifting, and reach over head  PARTICIPATION LIMITATIONS: cleaning, laundry, community activity, occupation, and yard work  PERSONAL FACTORS: Time since onset of injury/illness/exacerbation are also affecting patient's functional outcome.   REHAB POTENTIAL: Good  CLINICAL DECISION MAKING: Stable/uncomplicated  EVALUATION COMPLEXITY: Low  GOALS: Goals reviewed with patient? Yes   SHORT TERM GOALS = LONG TERM GOALS: Target date: 11/09/22  Pt will be independent in self MLD for long term management of lymphedema.  Baseline:  Goal status: MET 02/07/23 pt is independent with this   2.  Pt will obtain appropriate compression garments for long term management of lymphedema.  Baseline:  Goal status: MET 04/24/23- has day and night time custom garments and all fit 02/07/23- pt has obtained a flat knit sleeve and glove  and night time garment - she is awaiting remake of glove which was too large  3.  Pt will demonstrate 160 degrees of L shoulder flexion to allow her to reach overhead. Baseline:  Goal status: MET 02/07/23: 162; 11/09/2022:  155  4. Pt will demonstrate 170 degrees of L shoulder abduction to allow her to reach out to the side.   Goal:MET 02/07/23: 175,  12/08/22 - 164  5. Pt will be independent in a home exercise program for continued stretching and strengthening.  Goal: MET 02/07/23  6. Pt will report she is able to raise her L arm to end range wihtout increased pain in her axilla to allow improved comfort.   GOAL: MET 02/07/23- pt reports there is no pain but still numbness, educated pt that numbness is normal  PLAN:  PT FREQUENCY:one visit  PT DURATION: 4 weeks   PLANNED INTERVENTIONS: Therapeutic exercises, Therapeutic activity, Patient/Family education, Self Care, Joint mobilization, Manual lymph drainage, Compression bandaging, scar mobilization, Taping, Vasopneumatic device, and Manual therapy  PLAN FOR NEXT SESSION d/c this visit   Leonette Most, PT 04/24/2023, 9:49 AM [facility] [address] [place], [place], [address] Phone: [phone]   Fax:  [phone]  PHYSICAL THERAPY DISCHARGE SUMMARY  Visits from Start of Care: 28  Current functional level related to goals / functional outcomes: All goals met   Remaining deficits: None   Education / Equipment: HEP, lymphedema, compression garments   Patient agrees to discharge. Patient goals were met. Patient is being discharged due to meeting the stated rehab goals.  Milagros Loll Neche, [place] 04/24/23 9:50 AM

## 2023-04-26 ENCOUNTER — Other Ambulatory Visit (HOSPITAL_COMMUNITY): Payer: Self-pay

## 2023-04-28 ENCOUNTER — Other Ambulatory Visit (HOSPITAL_COMMUNITY): Payer: Self-pay

## 2023-05-01 ENCOUNTER — Other Ambulatory Visit: Payer: Self-pay

## 2023-05-04 ENCOUNTER — Other Ambulatory Visit (HOSPITAL_COMMUNITY): Payer: Self-pay

## 2023-05-04 ENCOUNTER — Ambulatory Visit (HOSPITAL_COMMUNITY)
Admission: RE | Admit: 2023-05-04 | Discharge: 2023-05-04 | Disposition: A | Discharge status: Home / Self Care | Payer: Self-pay | Source: Ambulatory Visit | Attending: Adult Health | Admitting: Adult Health

## 2023-05-04 ENCOUNTER — Inpatient Hospital Stay: Payer: No Typology Code available for payment source | Admitting: Adult Health

## 2023-05-04 ENCOUNTER — Encounter: Payer: Self-pay | Admitting: Hematology and Oncology

## 2023-05-04 ENCOUNTER — Other Ambulatory Visit: Payer: Self-pay | Admitting: Adult Health

## 2023-05-04 ENCOUNTER — Encounter: Payer: Self-pay | Admitting: Adult Health

## 2023-05-04 ENCOUNTER — Inpatient Hospital Stay: Payer: No Typology Code available for payment source

## 2023-05-04 VITALS — BP 116/67 | HR 68 | Temp 97.7°F | Resp 16 | Wt 193.8 lb

## 2023-05-04 DIAGNOSIS — Z170 Estrogen receptor positive status [ER+]: Secondary | ICD-10-CM | POA: Insufficient documentation

## 2023-05-04 DIAGNOSIS — C50412 Malignant neoplasm of upper-outer quadrant of left female breast: Secondary | ICD-10-CM

## 2023-05-04 DIAGNOSIS — M546 Pain in thoracic spine: Secondary | ICD-10-CM | POA: Insufficient documentation

## 2023-05-04 DIAGNOSIS — Z95828 Presence of other vascular implants and grafts: Secondary | ICD-10-CM

## 2023-05-04 LAB — CBC WITH DIFFERENTIAL/PLATELET
Abs Immature Granulocytes: 0.01 10*3/uL (ref 0.00–0.07)
Basophils Absolute: 0 10*3/uL (ref 0.0–0.1)
Basophils Relative: 0 %
Eosinophils Absolute: 0.1 10*3/uL (ref 0.0–0.5)
Eosinophils Relative: 2 %
HCT: 33.3 % — ABNORMAL LOW (ref 36.0–46.0)
Hemoglobin: 11.5 g/dL — ABNORMAL LOW (ref 12.0–15.0)
Immature Granulocytes: 0 %
Lymphocytes Relative: 29 %
Lymphs Abs: 0.9 10*3/uL (ref 0.7–4.0)
MCH: 35.1 pg — ABNORMAL HIGH (ref 26.0–34.0)
MCHC: 34.5 g/dL (ref 30.0–36.0)
MCV: 101.5 fL — ABNORMAL HIGH (ref 80.0–100.0)
Monocytes Absolute: 0.4 10*3/uL (ref 0.1–1.0)
Monocytes Relative: 12 %
Neutro Abs: 1.7 10*3/uL (ref 1.7–7.7)
Neutrophils Relative %: 57 %
Platelets: 158 10*3/uL (ref 150–400)
RBC: 3.28 MIL/uL — ABNORMAL LOW (ref 3.87–5.11)
RDW: 15.1 % (ref 11.5–15.5)
WBC: 3 10*3/uL — ABNORMAL LOW (ref 4.0–10.5)
nRBC: 0 % (ref 0.0–0.2)

## 2023-05-04 LAB — COMPREHENSIVE METABOLIC PANEL
ALT: 18 U/L (ref 0–44)
AST: 21 U/L (ref 15–41)
Albumin: 3.7 g/dL (ref 3.5–5.0)
Alkaline Phosphatase: 94 U/L (ref 38–126)
Anion gap: 7 (ref 5–15)
BUN: 14 mg/dL (ref 6–20)
CO2: 26 mmol/L (ref 22–32)
Calcium: 9.1 mg/dL (ref 8.9–10.3)
Chloride: 106 mmol/L (ref 98–111)
Creatinine, Ser: 0.66 mg/dL (ref 0.44–1.00)
GFR, Estimated: >60 mL/min (ref >60)
Glucose, Bld: 98 mg/dL (ref 70–99)
Potassium: 3.8 mmol/L (ref 3.5–5.1)
Sodium: 139 mmol/L (ref 135–145)
Total Bilirubin: 0.8 mg/dL (ref 0.3–1.2)
Total Protein: 6.6 g/dL (ref 6.5–8.1)

## 2023-05-04 MED ORDER — HEPARIN SOD (PORK) LOCK FLUSH 100 UNIT/ML IV SOLN
250.0000 [IU] | Freq: Once | INTRAVENOUS | Status: AC
  Administered 2023-05-04: 250 [IU]

## 2023-05-04 MED ORDER — SODIUM CHLORIDE 0.9% FLUSH
10.0000 mL | Freq: Once | INTRAVENOUS | Status: AC
  Administered 2023-05-04: 10 mL

## 2023-05-04 MED ORDER — HYDROCODONE-ACETAMINOPHEN 5-325 MG PO TABS
1.0000 | ORAL_TABLET | Freq: Four times a day (QID) | ORAL | 0 refills | Status: DC | PRN
Start: 2023-05-04 — End: 2023-07-13
  Filled 2023-05-04: qty 20, 5d supply, fill #0

## 2023-05-04 NOTE — Assessment & Plan Note (Addendum)
[name] is a 47 year old woman with breast cancer that is spread to her skin.  She is on capecitabine twice daily 2 weeks on and 1 week off.  Palmar Plantar dysesthesia: Since this is worsening and she is having increased thoracic back pain she will not start the capecitabine until we can see her back next week after holding therapy for an additional week.  Thoracic back pain: I have placed orders for x-rays of her thoracic spine to be completed today.  She may very well need to undergo thoracic spine MRI.  Tylenol or Advil or not controlling her pain, I prescribed Vicodin #20.  We discussed goals of pain management which include improving her functionality while decreasing pain with minimal side effects.  I reviewed a bowel regimen for her to take to ensure that she does not experience constipation with the Vicodin.  Asked that she keep track of how often she needs to take the Vicodin and whether or not it improves her discomfort.  [name] will return in 1 week for labs and follow-up.

## 2023-05-04 NOTE — Progress Notes (Signed)
[facility] Cancer Follow up:    Monica Andrew, NP [address] [place] [address]   DIAGNOSIS:  Cancer Staging  Malignant neoplasm of upper-outer quadrant of left breast in female, estrogen receptor positive (HCC) Staging form: Breast, AJCC 8th Edition - Clinical stage from 01/29/2020: Stage IIIA (cT2, cN1, cM0, G3, ER+, PR-, HER2-) - Signed by Rachel Moulds, MD on 06/28/2022 Stage prefix: Initial diagnosis Histologic grading system: 3 grade system   SUMMARY OF ONCOLOGIC HISTORY: Oncology History  Malignant neoplasm of upper-outer quadrant of left breast in female, estrogen receptor positive (HCC)  01/23/2020 Initial Diagnosis   [place] woman status post left breast upper outer quadrant biopsy 01/23/2020 for a clinical T3 N1, 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             (a) chest CT scan and bone scan 02/07/2020 showed no evidence of metastatic disease   01/29/2020 Cancer Staging   Staging form: Breast, AJCC 8th Edition - Clinical stage from 01/29/2020: Stage IIIA (cT2, cN1, cM0, G3, ER+, PR-, HER2-) - Signed by Rachel Moulds, MD on 06/28/2022 Stage prefix: Initial diagnosis Histologic grading system: 3 grade system   02/07/2020 Genetic Testing   Negative genetic testing. No pathogenic variants identified on the [facility] Common Hereditary Cancers Panel. VUS in MSH6 called c.2744C>G identified. The report date is 02/07/2020.  The Common Hereditary Cancers Panel offered by [facility] includes sequencing and/or deletion duplication testing of the following 48 genes: APC, ATM, AXIN2, BARD1, BMPR1A, BRCA1, BRCA2, BRIP1, CDH1, CDKN2A (p14ARF), CDKN2A (p16INK4a), CKD4, CHEK2, CTNNA1, DICER1, EPCAM (Deletion/duplication testing only), GREM1 (promoter region deletion/duplication testing only), KIT, MEN1, MLH1, MSH2, MSH3, MSH6, MUTYH, NBN, NF1, NHTL1, PALB2, PDGFRA, PMS2, POLD1, POLE, PTEN, RAD50,  RAD51C, RAD51D, RNF43, SDHB, SDHC, SDHD, SMAD4, SMARCA4. STK11, TP53, TSC1, TSC2, and VHL.  The following genes were evaluated for sequence changes only: SDHA and HOXB13 c.251G>A variant only.  UPDATE: MSH6 c.2744C>G (p.Ala915Gly) VUS has been amended to Benign. Amended report date is 02/13/2023.   02/12/2020 - 05/28/2020 Neo-Adjuvant Chemotherapy   neoadjuvant chemotherapy consisting of doxorubicin and cyclophosphamide in dose dense fashion x4 started 02/12/2020, completed 03/25/2020, followed by paclitaxel and carboplatin weekly x12 started 04/08/2020 and completed on 05/28/2020             (a) echo 02/07/2020 shows an ejection fraction in the 55-60% range             (b) Epirubicin substituted for Doxorubicin starting with cycle 2 of AC secondary to allergic rash from Doxorubicin   07/15/2020 Surgery   status post left lumpectomy and sentinel lymph node sampling 07/15/2020 for a ypT1b ypN0 residual invasive ductal carcinoma, grade 3, with negative margins.             (a) a total of 4 left axillary lymph nodes were removed             (b) repeat prognostic panel on the final pathology again triple negative, with an MIB-1 of 50%.   08/26/2020 - 10/09/2020 Radiation Therapy   adjuvant radiation completed 10/09/2020             (a) sensitizing capecitabine attempted but not tolerated by the patient  08/26/2020 through 10/09/2020 Site Technique Total Dose (Gy) Dose per Fx (Gy) Completed Fx Beam Energies  Breast, Left: Breast_Lt 3D 50/50 2 25/25 15X  Breast, Left: Breast_Lt_PAB_SCV 3D 50/50 2 25/25 10X, 15X  Breast, Left:  Breast_Lt_Bst 3D 10/10 2 5/5 6X, 10X     11/23/2020 - 01/04/2021 Adjuvant Chemotherapy   adjuvant pembrolizumab started 11/23/2020, to continue for 1 year             (a) discontinued after 01/04/2021 dose with multiple side effects requiring steroids for resolution   03/30/2022 Mammogram   Mammogram and ultrasound show numerous masses throughout left breast not visualized in  01/2022, +left axillary lymphadenopathy, skin thickening in right breast and in upper abdomen just below sternum   04/05/2022 - 07/20/2022 Chemotherapy   Patient is on Treatment Plan : BREAST METASTATIC Fam-Trastuzumab Deruxtecan-nxki (Enhertu) (5.4) q21d     08/21/2022 Surgery   Left breast mastectomy and lymph node biopsy shows no residual carcinoma.     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  Prognostic panel sent to [facility]: Left breast skin, punch biopsy: Skin with dermal lymphatic involvement by carcinoma (confirmed with outside CK7 IHC, reviewed). Biomarkers are reported as negative for ER, PR, Her2/neu (confirmed on review but please note sample size is extremely small).   02/15/2022 - 03/08/2022 Chemotherapy    Taxotere/Cytoxan x 2, discontinued due to progression of breast.   03/30/2022 Mammogram   Mammogram and ultrasound show numerous masses throughout left breast not visualized in 01/2022, +left axillary lymphadenopathy, skin thickening in right breast and in upper abdomen just below sternum   04/05/2022 - 07/20/2022 Chemotherapy   Patient is on Treatment Plan : BREAST METASTATIC Fam-Trastuzumab Deruxtecan-nxki (Enhertu) (5.4) q21d     11/25/2022 -  Chemotherapy   Patient is on Treatment Plan : BREAST Capecitabine q21d       CURRENT THERAPY: Capecitabine  INTERVAL HISTORY: [name] 47 y.o. female returns for follow-up and evaluation prior to restarting capecitabine.  She tells me she is taking it 3 tablets in the morning and 2 tablets in the evening 2 weeks on and 1 week off.  She notes that her feet are more sore than they have previously been.  She is also experiencing increasing upper back pain.  She says that sometimes it is so significant that it keeps her up at night.  She declined  x-rays at her last visit however is willing to proceed with them today.  She has tried Tylenol and Advil for the pain both of which were not helpful.   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) 04/01/2022   Encounter for dental examination 03/11/2022   Acute apical periodontitis 03/11/2022   Phobia of dental procedure 03/11/2022   Defective dental restoration 03/11/2022   Accretions on teeth 03/11/2022   Chronic periodontitis 03/11/2022   Teeth missing 03/11/2022   Caries 03/11/2022   Generalized gingival recession 03/11/2022   Pain, dental 03/08/2022   Cancer-related pain 03/08/2022   Port-A-Cath in place 02/15/2022   Oral allergy syndrome, subsequent encounter 02/03/2022   Adverse effect of other drugs, medicaments and biological substances, subsequent encounter 02/02/2022   Other adverse food reactions, not elsewhere classified, subsequent encounter 02/02/2022   Chest tightness 02/02/2022  Other allergic rhinitis 02/02/2022   Allergic conjunctivitis of both eyes 02/02/2022   Language barrier 12/11/2020   Morbid obesity (HCC)    History of breast cancer    Genetic testing 02/13/2020   Malignant neoplasm of upper-outer quadrant of left breast in female, estrogen receptor positive (HCC) 01/27/2020   Impaired ability to use community resources due to language barrier 07/13/2013    is allergic to omnipaque [iohexol], doxorubicin hcl, keytruda [pembrolizumab], other, tape, and wound dressing adhesive.  MEDICAL HISTORY: Past Medical History:  Diagnosis Date   Allergy    Asthma    Breast cancer in female [facility])    Left   Breast disorder    breast cancer May 2021   Chronic back pain    Headache    High  triglycerides    History of breast cancer    History of COVID-19 04/20/2021   Hx of migraines    Morbid obesity (HCC)    Personal history of chemotherapy    Personal history of radiation therapy     SURGICAL HISTORY: Past Surgical History:  Procedure Laterality Date   BREAST LUMPECTOMY     BREAST LUMPECTOMY WITH RADIOACTIVE SEED AND SENTINEL LYMPH NODE BIOPSY Left 07/15/2020   Procedure: LEFT BREAST LUMPECTOMY WITH RADIOACTIVE SEED AND LEFT AXILLARY SENTINEL LYMPH NODE BIOPSY, LEFT AXILLARY NODE RADIOACTIVE SEED GUIDED EXCISION, LEFT BREAST RADIOACTIVE SEED GUIDED EXCISION IM NODE;  Surgeon: Emelia Loron, MD;  Location: [facility] OR;  Service: General;  Laterality: Left;  PEC BLOCK   CESAREAN SECTION WITH BILATERAL TUBAL LIGATION Bilateral 07/23/2015   Procedure: CESAREAN SECTION WITH BILATERAL TUBAL LIGATION;  Surgeon: Reva Bores, MD;  Location: [facility];  Service: Obstetrics;  Laterality: Bilateral;   IR IMAGING GUIDED PORT INSERTION  02/11/2022   MODIFIED MASTECTOMY Left 08/22/2022   Procedure: LEFT MODIFIED RADICAL MASTECTOMY, LEFT AXILLARY NODE DISSECTION, AND SKIN BIOPSY OF BACK;  Surgeon: Emelia Loron, MD;  Location: [facility];  Service: General;  Laterality: Left;   PORT-A-CATH REMOVAL N/A 04/30/2021   Procedure: REMOVAL PORT-A-CATH;  Surgeon: Emelia Loron, MD;  Location: [facility];  Service: General;  Laterality: N/A;   PORTACATH PLACEMENT Right 02/11/2020   Procedure: INSERTION PORT-A-CATH WITH ULTRASOUND GUIDANCE;  Surgeon: Emelia Loron, MD;  Location: [facility];  Service: General;  Laterality: Right;   TUBAL LIGATION      SOCIAL HISTORY: Social History   Socioeconomic History   Marital status: Legally Separated    Spouse name: Not on file   Number of children: 3   Years of education: Not on file   Highest education level: 9th grade  Occupational History   Occupation: Holiday representative  Tobacco Use   Smoking status: Never    Smokeless tobacco: Never  Vaping Use   Vaping status: Never Used  Substance and Sexual Activity   Alcohol use: No   Drug use: Never   Sexual activity: Yes    Birth control/protection: Surgical  Other Topics Concern   Not on file  Social History Narrative   Not on file   Social Determinants of Health   Financial Resource Strain: Not on file  Food Insecurity: No Food Insecurity (10/11/2022)   Hunger Vital Sign    Worried About Running Out of Food in the Last Year: Never true    Ran Out of Food in the Last Year: Never true  Transportation Needs: No Transportation Needs (10/11/2022)   PRAPARE - Administrator, Civil Service (Medical): No  Lack of Transportation (Non-Medical): No  Physical Activity: Not on file  Stress: Not on file  Social Connections: Not on file  Intimate Partner Violence: Not At Risk (10/11/2022)   Humiliation, Afraid, Rape, and Kick questionnaire    Fear of Current or Ex-Partner: No    Emotionally Abused: No    Physically Abused: No    Sexually Abused: No    FAMILY HISTORY: Family History  Problem Relation Age of Onset   Heart disease Maternal Grandmother     Review of Systems  Constitutional:  Negative for appetite change, chills, fatigue, fever and unexpected weight change.  HENT:   Negative for hearing loss, lump/mass and trouble swallowing.   Eyes:  Negative for eye problems and icterus.  Respiratory:  Negative for chest tightness, cough and shortness of breath.   Cardiovascular:  Negative for chest pain, leg swelling and palpitations.  Gastrointestinal:  Negative for abdominal distention, abdominal pain, constipation, diarrhea, nausea and vomiting.  Endocrine: Negative for hot flashes.  Genitourinary:  Negative for difficulty urinating.   Musculoskeletal:  Positive for back pain. Negative for arthralgias.  Skin:  Negative for itching and rash.  Neurological:  Negative for dizziness, extremity weakness, headaches and numbness.   Hematological:  Negative for adenopathy. Does not bruise/bleed easily.  Psychiatric/Behavioral:  Negative for depression. The patient is not nervous/anxious.       PHYSICAL EXAMINATION   Onc Performance Status - 05/04/23 1100       ECOG Perf Status   ECOG Perf Status Fully active, able to carry on all pre-disease performance without restriction      KPS SCALE   KPS % SCORE Able to carry on normal activity, minor s/s of disease             Vitals:   05/04/23 1124  BP: 116/67  Pulse: 68  Resp: 16  Temp: 97.7 F (36.5 C)  SpO2: 100%    Physical Exam Constitutional:      General: She is not in acute distress.    Appearance: Normal appearance. She is not toxic-appearing.  HENT:     Head: Normocephalic and atraumatic.     Mouth/Throat:     Mouth: Mucous membranes are moist.     Pharynx: Oropharynx is clear. No oropharyngeal exudate or posterior oropharyngeal erythema.  Eyes:     General: No scleral icterus. Cardiovascular:     Rate and Rhythm: Normal rate and regular rhythm.     Pulses: Normal pulses.     Heart sounds: Normal heart sounds.  Pulmonary:     Effort: Pulmonary effort is normal.     Breath sounds: Normal breath sounds.  Abdominal:     General: Abdomen is flat. Bowel sounds are normal. There is no distension.     Palpations: Abdomen is soft.     Tenderness: There is no abdominal tenderness.  Musculoskeletal:        General: No swelling.     Cervical back: Neck supple.  Lymphadenopathy:     Cervical: No cervical adenopathy.  Skin:    General: Skin is warm and dry.     Findings: No rash.     Comments: Soles of the feet with erythema, swelling, and cracking around the edges   Neurological:     General: No focal deficit present.     Mental Status: She is alert.  Psychiatric:        Mood and Affect: Mood normal.        Behavior: Behavior normal.  LABORATORY DATA:  CBC    Component Value Date/Time   WBC 3.0 (L) 05/04/2023 1102   RBC  3.28 (L) 05/04/2023 1102   HGB 11.5 (L) 05/04/2023 1102   HGB 11.6 (L) 07/20/2022 0856   HGB 13.3 10/01/2021 1004   HCT 33.3 (L) 05/04/2023 1102   HCT 40.4 10/01/2021 1004   PLT 158 05/04/2023 1102   PLT 220 07/20/2022 0856   PLT 212 10/01/2021 1004   MCV 101.5 (H) 05/04/2023 1102   MCV 87 10/01/2021 1004   MCH 35.1 (H) 05/04/2023 1102   MCHC 34.5 05/04/2023 1102   RDW 15.1 05/04/2023 1102   RDW 13.3 10/01/2021 1004   LYMPHSABS 0.9 05/04/2023 1102   LYMPHSABS 1.6 10/01/2021 1004   MONOABS 0.4 05/04/2023 1102   EOSABS 0.1 05/04/2023 1102   EOSABS 0.1 10/01/2021 1004   BASOSABS 0.0 05/04/2023 1102   BASOSABS 0.0 10/01/2021 1004    CMP     Component Value Date/Time   NA 140 04/13/2023 0900   NA 138 09/30/2022 1149   K 4.1 04/13/2023 0900   CL 106 04/13/2023 0900   CO2 29 04/13/2023 0900   GLUCOSE 97 04/13/2023 0900   BUN 13 04/13/2023 0900   BUN 11 09/30/2022 1149   CREATININE 0.72 04/13/2023 0900   CREATININE 0.78 12/23/2022 0959   CALCIUM 9.2 04/13/2023 0900   PROT 6.6 04/13/2023 0900   PROT 7.1 09/30/2022 1149   ALBUMIN 4.1 04/13/2023 0900   ALBUMIN 4.5 09/30/2022 1149   AST 19 04/13/2023 0900   AST 18 12/23/2022 0959   ALT 14 04/13/2023 0900   ALT 13 12/23/2022 0959   ALKPHOS 99 04/13/2023 0900   BILITOT 1.0 04/13/2023 0900   BILITOT 0.5 12/23/2022 0959   GFRNONAA >60 04/13/2023 0900   GFRNONAA >60 12/23/2022 0959   GFRAA >60 06/03/2020 0949   GFRAA >60 02/26/2020 0915      ASSESSMENT and THERAPY PLAN:   Malignant neoplasm of upper-outer quadrant of left breast in female, estrogen receptor positive (HCC) [name] is a 47 year old woman with breast cancer that is spread to her skin.  She is on capecitabine twice daily 2 weeks on and 1 week off.  Palmar Plantar dysesthesia: Since this is worsening and she is having increased thoracic back pain she will not start the capecitabine until we can see her back next week after holding therapy for an additional  week.  Thoracic back pain: I have placed orders for x-rays of her thoracic spine to be completed today.  She may very well need to undergo thoracic spine MRI.  Tylenol or Advil or not controlling her pain, I prescribed Vicodin #20.  We discussed goals of pain management which include improving her functionality while decreasing pain with minimal side effects.  I reviewed a bowel regimen for her to take to ensure that she does not experience constipation with the Vicodin.  Asked that she keep track of how often she needs to take the Vicodin and whether or not it improves her discomfort.  [name] will return in 1 week for labs and follow-up.   All questions were answered. The patient knows to call the clinic with any problems, questions or concerns. We can certainly see the patient much sooner if necessary.  Total encounter time:30 minutes*in face-to-face visit time, chart review, lab review, care coordination, order entry, and documentation of the encounter time.  Lillard Anes, NP 05/04/23 12:13 PM Medical Oncology and Hematology [facility] [address] [place]  [place], [place] [address] Tel. [phone]    Fax. [phone]  *Total Encounter Time as defined by the Centers for Medicare and Medicaid Services includes, in addition to the face-to-face time of a patient visit (documented in the note above) non-face-to-face time: obtaining and reviewing outside history, ordering and reviewing medications, tests or procedures, care coordination (communications with other health care professionals or caregivers) and documentation in the medical record.

## 2023-05-07 ENCOUNTER — Ambulatory Visit (HOSPITAL_COMMUNITY)
Admission: RE | Admit: 2023-05-07 | Discharge: 2023-05-07 | Disposition: A | Discharge status: Home / Self Care | Payer: No Typology Code available for payment source | Source: Ambulatory Visit | Attending: Adult Health | Admitting: Adult Health

## 2023-05-07 DIAGNOSIS — Z170 Estrogen receptor positive status [ER+]: Secondary | ICD-10-CM | POA: Insufficient documentation

## 2023-05-07 DIAGNOSIS — C50412 Malignant neoplasm of upper-outer quadrant of left female breast: Secondary | ICD-10-CM | POA: Insufficient documentation

## 2023-05-07 DIAGNOSIS — M546 Pain in thoracic spine: Secondary | ICD-10-CM | POA: Insufficient documentation

## 2023-05-07 MED ORDER — GADOBUTROL 1 MMOL/ML IV SOLN
9.0000 mL | Freq: Once | INTRAVENOUS | Status: AC | PRN
  Administered 2023-05-07: 9 mL via INTRAVENOUS

## 2023-05-11 ENCOUNTER — Other Ambulatory Visit: Payer: Self-pay | Admitting: Nurse Practitioner

## 2023-05-11 ENCOUNTER — Inpatient Hospital Stay (HOSPITAL_BASED_OUTPATIENT_CLINIC_OR_DEPARTMENT_OTHER): Payer: No Typology Code available for payment source | Admitting: Adult Health

## 2023-05-11 ENCOUNTER — Encounter: Payer: Self-pay | Admitting: Adult Health

## 2023-05-11 ENCOUNTER — Inpatient Hospital Stay: Payer: No Typology Code available for payment source

## 2023-05-11 VITALS — BP 118/56 | HR 82 | Temp 97.8°F | Resp 18 | Ht 61.0 in | Wt 193.3 lb

## 2023-05-11 DIAGNOSIS — C50412 Malignant neoplasm of upper-outer quadrant of left female breast: Secondary | ICD-10-CM

## 2023-05-11 DIAGNOSIS — Z170 Estrogen receptor positive status [ER+]: Secondary | ICD-10-CM

## 2023-05-11 LAB — COMPREHENSIVE METABOLIC PANEL
ALT: 18 U/L (ref 0–44)
AST: 22 U/L (ref 15–41)
Albumin: 4.3 g/dL (ref 3.5–5.0)
Alkaline Phosphatase: 109 U/L (ref 38–126)
Anion gap: 5 (ref 5–15)
BUN: 14 mg/dL (ref 6–20)
CO2: 29 mmol/L (ref 22–32)
Calcium: 9.5 mg/dL (ref 8.9–10.3)
Chloride: 107 mmol/L (ref 98–111)
Creatinine, Ser: 0.83 mg/dL (ref 0.44–1.00)
GFR, Estimated: >60 mL/min (ref >60)
Glucose, Bld: 120 mg/dL — ABNORMAL HIGH (ref 70–99)
Potassium: 4.5 mmol/L (ref 3.5–5.1)
Sodium: 141 mmol/L (ref 135–145)
Total Bilirubin: 0.7 mg/dL (ref 0.3–1.2)
Total Protein: 7.1 g/dL (ref 6.5–8.1)

## 2023-05-11 LAB — CBC WITH DIFFERENTIAL/PLATELET
Abs Immature Granulocytes: 0.01 10*3/uL (ref 0.00–0.07)
Basophils Absolute: 0 10*3/uL (ref 0.0–0.1)
Basophils Relative: 0 %
Eosinophils Absolute: 0.1 10*3/uL (ref 0.0–0.5)
Eosinophils Relative: 2 %
HCT: 35.8 % — ABNORMAL LOW (ref 36.0–46.0)
Hemoglobin: 12.3 g/dL (ref 12.0–15.0)
Immature Granulocytes: 0 %
Lymphocytes Relative: 33 %
Lymphs Abs: 1.4 10*3/uL (ref 0.7–4.0)
MCH: 34.5 pg — ABNORMAL HIGH (ref 26.0–34.0)
MCHC: 34.4 g/dL (ref 30.0–36.0)
MCV: 100.3 fL — ABNORMAL HIGH (ref 80.0–100.0)
Monocytes Absolute: 0.3 10*3/uL (ref 0.1–1.0)
Monocytes Relative: 7 %
Neutro Abs: 2.5 10*3/uL (ref 1.7–7.7)
Neutrophils Relative %: 58 %
Platelets: 143 10*3/uL — ABNORMAL LOW (ref 150–400)
RBC: 3.57 MIL/uL — ABNORMAL LOW (ref 3.87–5.11)
RDW: 14.1 % (ref 11.5–15.5)
WBC: 4.2 10*3/uL (ref 4.0–10.5)
nRBC: 0 % (ref 0.0–0.2)

## 2023-05-15 ENCOUNTER — Encounter: Payer: Self-pay | Admitting: Hematology and Oncology

## 2023-05-15 NOTE — Progress Notes (Signed)
[facility] Cancer Follow up:    Monica Andrew, NP [address] [place] [address]   DIAGNOSIS:  Cancer Staging  Malignant neoplasm of upper-outer quadrant of left breast in female, estrogen receptor positive (HCC) Staging form: Breast, AJCC 8th Edition - Clinical stage from 01/29/2020: Stage IIIA (cT2, cN1, cM0, G3, ER+, PR-, HER2-) - Signed by Monica Moulds, MD on 06/28/2022 Stage prefix: Initial diagnosis Histologic grading system: 3 grade system   SUMMARY OF ONCOLOGIC HISTORY: Oncology History  Malignant neoplasm of upper-outer quadrant of left breast in female, estrogen receptor positive (HCC)  01/23/2020 Initial Diagnosis   [name] status post left breast upper outer quadrant biopsy 01/23/2020 for a clinical T3 N1, 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             (a) chest CT scan and bone scan 02/07/2020 showed no evidence of metastatic disease   01/29/2020 Cancer Staging   Staging form: Breast, AJCC 8th Edition - Clinical stage from 01/29/2020: Stage IIIA (cT2, cN1, cM0, G3, ER+, PR-, HER2-) - Signed by Monica Moulds, MD on 06/28/2022 Stage prefix: Initial diagnosis Histologic grading system: 3 grade system   02/07/2020 Genetic Testing   Negative genetic testing. No pathogenic variants identified on the [facility] Common Hereditary Cancers Panel. VUS in MSH6 called c.2744C>G identified. The report date is 02/07/2020.  The Common Hereditary Cancers Panel offered by [facility] includes sequencing and/or deletion duplication testing of the following 48 genes: APC, ATM, AXIN2, BARD1, BMPR1A, BRCA1, BRCA2, BRIP1, CDH1, CDKN2A (p14ARF), CDKN2A (p16INK4a), CKD4, CHEK2, CTNNA1, DICER1, EPCAM (Deletion/duplication testing only), GREM1 (promoter region deletion/duplication testing only), KIT, MEN1, MLH1, MSH2, MSH3, MSH6, MUTYH, NBN, NF1, NHTL1, PALB2, PDGFRA, PMS2, POLD1, POLE, PTEN, RAD50,  RAD51C, RAD51D, RNF43, SDHB, SDHC, SDHD, SMAD4, SMARCA4. STK11, TP53, TSC1, TSC2, and VHL.  The following genes were evaluated for sequence changes only: SDHA and HOXB13 c.251G>A variant only.  UPDATE: MSH6 c.2744C>G (p.Ala915Gly) VUS has been amended to Benign. Amended report date is 02/13/2023.   02/12/2020 - 05/28/2020 Neo-Adjuvant Chemotherapy   neoadjuvant chemotherapy consisting of doxorubicin and cyclophosphamide in dose dense fashion x4 started 02/12/2020, completed 03/25/2020, followed by paclitaxel and carboplatin weekly x12 started 04/08/2020 and completed on 05/28/2020             (a) echo 02/07/2020 shows an ejection fraction in the 55-60% range             (b) Epirubicin substituted for Doxorubicin starting with cycle 2 of AC secondary to allergic rash from Doxorubicin   07/15/2020 Surgery   status post left lumpectomy and sentinel lymph node sampling 07/15/2020 for a ypT1b ypN0 residual invasive ductal carcinoma, grade 3, with negative margins.             (a) a total of 4 left axillary lymph nodes were removed             (b) repeat prognostic panel on the final pathology again triple negative, with an MIB-1 of 50%.   08/26/2020 - 10/09/2020 Radiation Therapy   adjuvant radiation completed 10/09/2020             (a) sensitizing capecitabine attempted but not tolerated by the patient  08/26/2020 through 10/09/2020 Site Technique Total Dose (Gy) Dose per Fx (Gy) Completed Fx Beam Energies  Breast, Left: Breast_Lt 3D 50/50 2 25/25 15X  Breast, Left: Breast_Lt_PAB_SCV 3D 50/50 2 25/25 10X, 15X  Breast, Left:  Breast_Lt_Bst 3D 10/10 2 5/5 6X, 10X     11/23/2020 - 01/04/2021 Adjuvant Chemotherapy   adjuvant pembrolizumab started 11/23/2020, to continue for 1 year             (a) discontinued after 01/04/2021 dose with multiple side effects requiring steroids for resolution   03/30/2022 Mammogram   Mammogram and ultrasound show numerous masses throughout left breast not visualized in  01/2022, +left axillary lymphadenopathy, skin thickening in right breast and in upper abdomen just below sternum   04/05/2022 - 07/20/2022 Chemotherapy   Patient is on Treatment Plan : BREAST METASTATIC Fam-Trastuzumab Deruxtecan-nxki (Enhertu) (5.4) q21d     08/21/2022 Surgery   Left breast mastectomy and lymph node biopsy shows no residual carcinoma.     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  Prognostic panel sent to [facility]: Left breast skin, punch biopsy: Skin with dermal lymphatic involvement by carcinoma (confirmed with outside CK7 IHC, reviewed). Biomarkers are reported as negative for ER, PR, Her2/neu (confirmed on review but please note sample size is extremely small).   02/15/2022 - 03/08/2022 Chemotherapy    Taxotere/Cytoxan x 2, discontinued due to progression of breast.   03/30/2022 Mammogram   Mammogram and ultrasound show numerous masses throughout left breast not visualized in 01/2022, +left axillary lymphadenopathy, skin thickening in right breast and in upper abdomen just below sternum   04/05/2022 - 07/20/2022 Chemotherapy   Patient is on Treatment Plan : BREAST METASTATIC Fam-Trastuzumab Deruxtecan-nxki (Enhertu) (5.4) q21d     11/25/2022 -  Chemotherapy   Patient is on Treatment Plan : BREAST Capecitabine q21d       CURRENT THERAPY: Capecitabine  INTERVAL HISTORY: [name] 47 y.o. female returns accompanied by our spanish interpreter [name] for f/u of her breast cancer on Capecitabine therapy.  She is taking dose reduced capecitabine at 3 tabs in the am and 2 tabs in the evening 2 weeks on and 1 week off.  Her cycle was held last week due to increased dryness, cracking and pain on the soles of her feet.  That has since resolved.    She also had been complaining of back pain.  I  placed orders for MRI of her thoracic spine which was completed a couple of days ago, however the results are still pending with radiology.  I prescribed Vicodin for her pain last week, and it is improved.  She is taking the vicodin at night and is able to sleep.  She notes that she prefers to take the medication at night and not during the day because it can make her foggy headed.     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) 04/01/2022   Encounter for dental examination 03/11/2022   Acute apical periodontitis 03/11/2022   Phobia of dental procedure 03/11/2022   Defective dental restoration 03/11/2022   Accretions on teeth 03/11/2022   Chronic periodontitis 03/11/2022   Teeth missing 03/11/2022   Caries 03/11/2022   Generalized gingival recession 03/11/2022   Pain, dental 03/08/2022  Cancer-related pain 03/08/2022   Port-A-Cath in place 02/15/2022   Oral allergy syndrome, subsequent encounter 02/03/2022   Adverse effect of other drugs, medicaments and biological substances, subsequent encounter 02/02/2022   Other adverse food reactions, not elsewhere classified, subsequent encounter 02/02/2022   Chest tightness 02/02/2022   Other allergic rhinitis 02/02/2022   Allergic conjunctivitis of both eyes 02/02/2022   Language barrier 12/11/2020   Morbid obesity (HCC)    History of breast cancer    Genetic testing 02/13/2020   Malignant neoplasm of upper-outer quadrant of left breast in female, estrogen receptor positive (HCC) 01/27/2020   Impaired ability to use community resources due to language barrier 07/13/2013    is allergic to omnipaque [iohexol], doxorubicin hcl, keytruda [pembrolizumab], other, tape, and  wound dressing adhesive.  MEDICAL HISTORY: Past Medical History:  Diagnosis Date   Allergy    Asthma    Breast cancer in female [facility])    Left   Breast disorder    breast cancer May 2021   Chronic back pain    Headache    High triglycerides    History of breast cancer    History of COVID-19 04/20/2021   Hx of migraines    Morbid obesity (HCC)    Personal history of chemotherapy    Personal history of radiation therapy     SURGICAL HISTORY: Past Surgical History:  Procedure Laterality Date   BREAST LUMPECTOMY     BREAST LUMPECTOMY WITH RADIOACTIVE SEED AND SENTINEL LYMPH NODE BIOPSY Left 07/15/2020   Procedure: LEFT BREAST LUMPECTOMY WITH RADIOACTIVE SEED AND LEFT AXILLARY SENTINEL LYMPH NODE BIOPSY, LEFT AXILLARY NODE RADIOACTIVE SEED GUIDED EXCISION, LEFT BREAST RADIOACTIVE SEED GUIDED EXCISION IM NODE;  Surgeon: Emelia Loron, MD;  Location: [facility] OR;  Service: General;  Laterality: Left;  PEC BLOCK   CESAREAN SECTION WITH BILATERAL TUBAL LIGATION Bilateral 07/23/2015   Procedure: CESAREAN SECTION WITH BILATERAL TUBAL LIGATION;  Surgeon: Reva Bores, MD;  Location: [facility]S;  Service: Obstetrics;  Laterality: Bilateral;   IR IMAGING GUIDED PORT INSERTION  02/11/2022   MODIFIED MASTECTOMY Left 08/22/2022   Procedure: LEFT MODIFIED RADICAL MASTECTOMY, LEFT AXILLARY NODE DISSECTION, AND SKIN BIOPSY OF BACK;  Surgeon: Emelia Loron, MD;  Location: [facility];  Service: General;  Laterality: Left;   PORT-A-CATH REMOVAL N/A 04/30/2021   Procedure: REMOVAL PORT-A-CATH;  Surgeon: Emelia Loron, MD;  Location: [facility];  Service: General;  Laterality: N/A;   PORTACATH PLACEMENT Right 02/11/2020   Procedure: INSERTION PORT-A-CATH WITH ULTRASOUND GUIDANCE;  Surgeon: Emelia Loron, MD;  Location: [facility];  Service: General;  Laterality: Right;   TUBAL LIGATION      SOCIAL HISTORY: Social History   Socioeconomic History   Marital status:  Legally Separated    Spouse name: Not on file   Number of children: 3   Years of education: Not on file   Highest education level: 9th grade  Occupational History   Occupation: Holiday representative  Tobacco Use   Smoking status: Never   Smokeless tobacco: Never  Vaping Use   Vaping status: Never Used  Substance and Sexual Activity   Alcohol use: No   Drug use: Never   Sexual activity: Yes    Birth control/protection: Surgical  Other Topics Concern   Not on file  Social History Narrative   Not on file   Social Determinants of Health   Financial Resource Strain: Not on file  Food Insecurity: No Food Insecurity (10/11/2022)   Hunger Vital  Sign    Worried About Programme researcher, broadcasting/film/video in the Last Year: Never true    Ran Out of Food in the Last Year: Never true  Transportation Needs: No Transportation Needs (10/11/2022)   PRAPARE - Administrator, Civil Service (Medical): No    Lack of Transportation (Non-Medical): No  Physical Activity: Not on file  Stress: Not on file  Social Connections: Not on file  Intimate Partner Violence: Not At Risk (10/11/2022)   Humiliation, Afraid, Rape, and Kick questionnaire    Fear of Current or Ex-Partner: No    Emotionally Abused: No    Physically Abused: No    Sexually Abused: No    FAMILY HISTORY: Family History  Problem Relation Age of Onset   Heart disease Maternal Grandmother     Review of Systems  Constitutional:  Negative for appetite change, chills, fatigue, fever and unexpected weight change.  HENT:   Negative for hearing loss, lump/mass and trouble swallowing.   Eyes:  Negative for eye problems and icterus.  Respiratory:  Negative for chest tightness, cough and shortness of breath.   Cardiovascular:  Negative for chest pain, leg swelling and palpitations.  Gastrointestinal:  Negative for abdominal distention, abdominal pain, constipation, diarrhea, nausea and vomiting.  Endocrine: Negative for hot flashes.  Genitourinary:   Negative for difficulty urinating.   Musculoskeletal:  Positive for back pain. Negative for arthralgias.  Skin:  Negative for itching and rash.  Neurological:  Negative for dizziness, extremity weakness, headaches and numbness.  Hematological:  Negative for adenopathy. Does not bruise/bleed easily.  Psychiatric/Behavioral:  Negative for depression. The patient is not nervous/anxious.     PHYSICAL EXAMINATION  Vitals:   05/11/23 1526  BP: (!) 118/56  Pulse: 82  Resp: 18  Temp: 97.8 F (36.6 C)  SpO2: 100%    Physical Exam Constitutional:      General: She is not in acute distress.    Appearance: Normal appearance. She is not toxic-appearing.  HENT:     Head: Normocephalic and atraumatic.     Mouth/Throat:     Mouth: Mucous membranes are moist.     Pharynx: Oropharynx is clear. No oropharyngeal exudate or posterior oropharyngeal erythema.  Eyes:     General: No scleral icterus. Cardiovascular:     Rate and Rhythm: Normal rate and regular rhythm.     Pulses: Normal pulses.     Heart sounds: Normal heart sounds.  Pulmonary:     Effort: Pulmonary effort is normal.     Breath sounds: Normal breath sounds.  Abdominal:     General: Abdomen is flat. Bowel sounds are normal. There is no distension.     Palpations: Abdomen is soft.     Tenderness: There is no abdominal tenderness.  Musculoskeletal:        General: No swelling.     Cervical back: Neck supple.  Lymphadenopathy:     Cervical: No cervical adenopathy.  Skin:    General: Skin is warm and dry.     Findings: No rash.  Neurological:     General: No focal deficit present.     Mental Status: She is alert.  Psychiatric:        Mood and Affect: Mood normal.        Behavior: Behavior normal.     LABORATORY DATA:  CBC    Component Value Date/Time   WBC 4.2 05/11/2023 1515   RBC 3.57 (L) 05/11/2023 1515   HGB 12.3 05/11/2023 1515  HGB 11.6 (L) 07/20/2022 0856   HGB 13.3 10/01/2021 1004   HCT 35.8 (L)  05/11/2023 1515   HCT 40.4 10/01/2021 1004   PLT 143 (L) 05/11/2023 1515   PLT 220 07/20/2022 0856   PLT 212 10/01/2021 1004   MCV 100.3 (H) 05/11/2023 1515   MCV 87 10/01/2021 1004   MCH 34.5 (H) 05/11/2023 1515   MCHC 34.4 05/11/2023 1515   RDW 14.1 05/11/2023 1515   RDW 13.3 10/01/2021 1004   LYMPHSABS 1.4 05/11/2023 1515   LYMPHSABS 1.6 10/01/2021 1004   MONOABS 0.3 05/11/2023 1515   EOSABS 0.1 05/11/2023 1515   EOSABS 0.1 10/01/2021 1004   BASOSABS 0.0 05/11/2023 1515   BASOSABS 0.0 10/01/2021 1004    CMP     Component Value Date/Time   NA 141 05/11/2023 1515   NA 138 09/30/2022 1149   K 4.5 05/11/2023 1515   CL 107 05/11/2023 1515   CO2 29 05/11/2023 1515   GLUCOSE 120 (H) 05/11/2023 1515   BUN 14 05/11/2023 1515   BUN 11 09/30/2022 1149   CREATININE 0.83 05/11/2023 1515   CREATININE 0.78 12/23/2022 0959   CALCIUM 9.5 05/11/2023 1515   PROT 7.1 05/11/2023 1515   PROT 7.1 09/30/2022 1149   ALBUMIN 4.3 05/11/2023 1515   ALBUMIN 4.5 09/30/2022 1149   AST 22 05/11/2023 1515   AST 18 12/23/2022 0959   ALT 18 05/11/2023 1515   ALT 13 12/23/2022 0959   ALKPHOS 109 05/11/2023 1515   BILITOT 0.7 05/11/2023 1515   BILITOT 0.5 12/23/2022 0959   GFRNONAA >60 05/11/2023 1515   GFRNONAA >60 12/23/2022 0959   GFRAA >60 06/03/2020 0949   GFRAA >60 02/26/2020 0915    ASSESSMENT and THERAPY PLAN:   Malignant neoplasm of upper-outer quadrant of left breast in female, estrogen receptor positive (HCC) [name] is a 47 year old woman with breast cancer that is spread to her skin.  She is on capecitabine twice daily 2 weeks on and 1 week off.  Palmar Plantar dysesthesia: resolved.  Her labs are stable.  She will resume Capecitabine 3 ab sin the am and 2 tab in the evening, two weeks on and one week off.    Thoracic back pain: Awaiting final read of spine MRI.  Will f/u with patient once results are completed.  She will continue Vicodin nightly PRN.    [name] will return on 9/12  for labs and follow-up.  All questions were answered. The patient knows to call the clinic with any problems, questions or concerns. We can certainly see the patient much sooner if necessary.  Total encounter time: 20 minutes*in face-to-face visit time, chart review, lab review, care coordination, order entry, and documentation of the encounter time.  Lillard Anes, NP 05/15/23 10:06 PM Medical Oncology and Hematology [facility] [address] [place], [place] [address] Tel. [phone]    Fax. [phone]  *Total Encounter Time as defined by the Centers for Medicare and Medicaid Services includes, in addition to the face-to-face time of a patient visit (documented in the note above) non-face-to-face time: obtaining and reviewing outside history, ordering and reviewing medications, tests or procedures, care coordination (communications with other health care professionals or caregivers) and documentation in the medical record.

## 2023-05-15 NOTE — Assessment & Plan Note (Addendum)
[name] is a 47 year old woman with breast cancer that is spread to her skin.  She is on capecitabine twice daily 2 weeks on and 1 week off.  Palmar Plantar dysesthesia: resolved.  Her labs are stable.  She will resume Capecitabine 3 ab sin the am and 2 tab in the evening, two weeks on and one week off.    Thoracic back pain: Awaiting final read of spine MRI.  Will f/u with patient once results are completed.  She will continue Vicodin nightly PRN.    Suman will return on 9/12 for labs and follow-up.

## 2023-05-17 ENCOUNTER — Other Ambulatory Visit (HOSPITAL_COMMUNITY): Payer: Self-pay

## 2023-05-19 ENCOUNTER — Other Ambulatory Visit (HOSPITAL_COMMUNITY): Payer: Self-pay

## 2023-05-22 ENCOUNTER — Encounter (HOSPITAL_COMMUNITY): Payer: Self-pay

## 2023-05-22 ENCOUNTER — Other Ambulatory Visit (HOSPITAL_COMMUNITY): Payer: Self-pay

## 2023-05-23 ENCOUNTER — Inpatient Hospital Stay: Payer: No Typology Code available for payment source

## 2023-05-25 ENCOUNTER — Inpatient Hospital Stay: Payer: No Typology Code available for payment source | Attending: Hematology and Oncology

## 2023-05-25 ENCOUNTER — Inpatient Hospital Stay (HOSPITAL_BASED_OUTPATIENT_CLINIC_OR_DEPARTMENT_OTHER): Payer: No Typology Code available for payment source | Admitting: Hematology and Oncology

## 2023-05-25 VITALS — BP 114/62 | HR 72 | Temp 97.7°F | Resp 16 | Wt 191.3 lb

## 2023-05-25 DIAGNOSIS — C50412 Malignant neoplasm of upper-outer quadrant of left female breast: Secondary | ICD-10-CM | POA: Insufficient documentation

## 2023-05-25 DIAGNOSIS — L271 Localized skin eruption due to drugs and medicaments taken internally: Secondary | ICD-10-CM | POA: Insufficient documentation

## 2023-05-25 DIAGNOSIS — Z95828 Presence of other vascular implants and grafts: Secondary | ICD-10-CM

## 2023-05-25 DIAGNOSIS — E559 Vitamin D deficiency, unspecified: Secondary | ICD-10-CM | POA: Insufficient documentation

## 2023-05-25 DIAGNOSIS — M549 Dorsalgia, unspecified: Secondary | ICD-10-CM | POA: Insufficient documentation

## 2023-05-25 DIAGNOSIS — E538 Deficiency of other specified B group vitamins: Secondary | ICD-10-CM | POA: Insufficient documentation

## 2023-05-25 DIAGNOSIS — R5383 Other fatigue: Secondary | ICD-10-CM | POA: Insufficient documentation

## 2023-05-25 DIAGNOSIS — Z170 Estrogen receptor positive status [ER+]: Secondary | ICD-10-CM

## 2023-05-25 DIAGNOSIS — Z9221 Personal history of antineoplastic chemotherapy: Secondary | ICD-10-CM | POA: Insufficient documentation

## 2023-05-25 DIAGNOSIS — Z923 Personal history of irradiation: Secondary | ICD-10-CM | POA: Insufficient documentation

## 2023-05-25 DIAGNOSIS — E781 Pure hyperglyceridemia: Secondary | ICD-10-CM | POA: Insufficient documentation

## 2023-05-25 DIAGNOSIS — Z8616 Personal history of COVID-19: Secondary | ICD-10-CM | POA: Insufficient documentation

## 2023-05-25 DIAGNOSIS — Z171 Estrogen receptor negative status [ER-]: Secondary | ICD-10-CM | POA: Insufficient documentation

## 2023-05-25 DIAGNOSIS — Z9012 Acquired absence of left breast and nipple: Secondary | ICD-10-CM | POA: Insufficient documentation

## 2023-05-25 LAB — COMPREHENSIVE METABOLIC PANEL
ALT: 16 U/L (ref 0–44)
AST: 21 U/L (ref 15–41)
Albumin: 4.1 g/dL (ref 3.5–5.0)
Alkaline Phosphatase: 97 U/L (ref 38–126)
Anion gap: 4 — ABNORMAL LOW (ref 5–15)
BUN: 9 mg/dL (ref 6–20)
CO2: 28 mmol/L (ref 22–32)
Calcium: 9.4 mg/dL (ref 8.9–10.3)
Chloride: 107 mmol/L (ref 98–111)
Creatinine, Ser: 0.72 mg/dL (ref 0.44–1.00)
GFR, Estimated: >60 mL/min (ref >60)
Glucose, Bld: 116 mg/dL — ABNORMAL HIGH (ref 70–99)
Potassium: 3.8 mmol/L (ref 3.5–5.1)
Sodium: 139 mmol/L (ref 135–145)
Total Bilirubin: 0.6 mg/dL (ref 0.3–1.2)
Total Protein: 6.9 g/dL (ref 6.5–8.1)

## 2023-05-25 LAB — CBC WITH DIFFERENTIAL/PLATELET
Abs Immature Granulocytes: 0.02 10*3/uL (ref 0.00–0.07)
Basophils Absolute: 0 10*3/uL (ref 0.0–0.1)
Basophils Relative: 0 %
Eosinophils Absolute: 0.1 10*3/uL (ref 0.0–0.5)
Eosinophils Relative: 1 %
HCT: 35.2 % — ABNORMAL LOW (ref 36.0–46.0)
Hemoglobin: 12.2 g/dL (ref 12.0–15.0)
Immature Granulocytes: 1 %
Lymphocytes Relative: 30 %
Lymphs Abs: 1.3 10*3/uL (ref 0.7–4.0)
MCH: 34.1 pg — ABNORMAL HIGH (ref 26.0–34.0)
MCHC: 34.7 g/dL (ref 30.0–36.0)
MCV: 98.3 fL (ref 80.0–100.0)
Monocytes Absolute: 0.4 10*3/uL (ref 0.1–1.0)
Monocytes Relative: 8 %
Neutro Abs: 2.6 10*3/uL (ref 1.7–7.7)
Neutrophils Relative %: 60 %
Platelets: 150 10*3/uL (ref 150–400)
RBC: 3.58 MIL/uL — ABNORMAL LOW (ref 3.87–5.11)
RDW: 13.8 % (ref 11.5–15.5)
WBC: 4.3 10*3/uL (ref 4.0–10.5)
nRBC: 0 % (ref 0.0–0.2)

## 2023-05-25 MED ORDER — SODIUM CHLORIDE 0.9% FLUSH
10.0000 mL | Freq: Once | INTRAVENOUS | Status: AC
  Administered 2023-05-25: 10 mL

## 2023-05-25 MED ORDER — HEPARIN SOD (PORK) LOCK FLUSH 100 UNIT/ML IV SOLN
500.0000 [IU] | Freq: Once | INTRAVENOUS | Status: AC
  Administered 2023-05-25: 500 [IU]

## 2023-05-25 NOTE — Progress Notes (Signed)
[facility] Cancer Follow up:    Monica Andrew, NP [address] [place] [address]   DIAGNOSIS:  Cancer Staging  Malignant neoplasm of upper-outer quadrant of left breast in female, estrogen receptor positive (HCC) Staging form: Breast, AJCC 8th Edition - Clinical stage from 01/29/2020: Stage IIIA (cT2, cN1, cM0, G3, ER+, PR-, HER2-) - Signed by Rachel Moulds, MD on 06/28/2022 Stage prefix: Initial diagnosis Histologic grading system: 3 grade system   SUMMARY OF ONCOLOGIC HISTORY: Oncology History  Malignant neoplasm of upper-outer quadrant of left breast in female, estrogen receptor positive (HCC)  01/23/2020 Initial Diagnosis   [name] woman status post left breast upper outer quadrant biopsy 01/23/2020 for a clinical T3 N1, 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             (a) chest CT scan and bone scan 02/07/2020 showed no evidence of metastatic disease   01/29/2020 Cancer Staging   Staging form: Breast, AJCC 8th Edition - Clinical stage from 01/29/2020: Stage IIIA (cT2, cN1, cM0, G3, ER+, PR-, HER2-) - Signed by Rachel Moulds, MD on 06/28/2022 Stage prefix: Initial diagnosis Histologic grading system: 3 grade system   02/07/2020 Genetic Testing   Negative genetic testing. No pathogenic variants identified on the [facility] Common Hereditary Cancers Panel. VUS in MSH6 called c.2744C>G identified. The report date is 02/07/2020.  The Common Hereditary Cancers Panel offered by [facility] includes sequencing and/or deletion duplication testing of the following 48 genes: APC, ATM, AXIN2, BARD1, BMPR1A, BRCA1, BRCA2, BRIP1, CDH1, CDKN2A (p14ARF), CDKN2A (p16INK4a), CKD4, CHEK2, CTNNA1, DICER1, EPCAM (Deletion/duplication testing only), GREM1 (promoter region deletion/duplication testing only), KIT, MEN1, MLH1, MSH2, MSH3, MSH6, MUTYH, NBN, NF1, NHTL1, PALB2, PDGFRA, PMS2, POLD1, POLE, PTEN, RAD50,  RAD51C, RAD51D, RNF43, SDHB, SDHC, SDHD, SMAD4, SMARCA4. STK11, TP53, TSC1, TSC2, and VHL.  The following genes were evaluated for sequence changes only: SDHA and HOXB13 c.251G>A variant only.  UPDATE: MSH6 c.2744C>G (p.Ala915Gly) VUS has been amended to Benign. Amended report date is 02/13/2023.   02/12/2020 - 05/28/2020 Neo-Adjuvant Chemotherapy   neoadjuvant chemotherapy consisting of doxorubicin and cyclophosphamide in dose dense fashion x4 started 02/12/2020, completed 03/25/2020, followed by paclitaxel and carboplatin weekly x12 started 04/08/2020 and completed on 05/28/2020             (a) echo 02/07/2020 shows an ejection fraction in the 55-60% range             (b) Epirubicin substituted for Doxorubicin starting with cycle 2 of AC secondary to allergic rash from Doxorubicin   07/15/2020 Surgery   status post left lumpectomy and sentinel lymph node sampling 07/15/2020 for a ypT1b ypN0 residual invasive ductal carcinoma, grade 3, with negative margins.             (a) a total of 4 left axillary lymph nodes were removed             (b) repeat prognostic panel on the final pathology again triple negative, with an MIB-1 of 50%.   08/26/2020 - 10/09/2020 Radiation Therapy   adjuvant radiation completed 10/09/2020             (a) sensitizing capecitabine attempted but not tolerated by the patient  08/26/2020 through 10/09/2020 Site Technique Total Dose (Gy) Dose per Fx (Gy) Completed Fx Beam Energies  Breast, Left: Breast_Lt 3D 50/50 2 25/25 15X  Breast, Left: Breast_Lt_PAB_SCV 3D 50/50 2 25/25 10X, 15X  Breast, Left:  Breast_Lt_Bst 3D 10/10 2 5/5 6X, 10X     11/23/2020 - 01/04/2021 Adjuvant Chemotherapy   adjuvant pembrolizumab started 11/23/2020, to continue for 1 year             (a) discontinued after 01/04/2021 dose with multiple side effects requiring steroids for resolution   03/30/2022 Mammogram   Mammogram and ultrasound show numerous masses throughout left breast not visualized in  01/2022, +left axillary lymphadenopathy, skin thickening in right breast and in upper abdomen just below sternum   04/05/2022 - 07/20/2022 Chemotherapy   Patient is on Treatment Plan : BREAST METASTATIC Fam-Trastuzumab Deruxtecan-nxki (Enhertu) (5.4) q21d     08/21/2022 Surgery   Left breast mastectomy and lymph node biopsy shows no residual carcinoma.     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  Prognostic panel sent to [facility]: Left breast skin, punch biopsy: Skin with dermal lymphatic involvement by carcinoma (confirmed with outside CK7 IHC, reviewed). Biomarkers are reported as negative for ER, PR, Her2/neu (confirmed on review but please note sample size is extremely small).   02/15/2022 - 03/08/2022 Chemotherapy    Taxotere/Cytoxan x 2, discontinued due to progression of breast.   03/30/2022 Mammogram   Mammogram and ultrasound show numerous masses throughout left breast not visualized in 01/2022, +left axillary lymphadenopathy, skin thickening in right breast and in upper abdomen just below sternum   04/05/2022 - 07/20/2022 Chemotherapy   Patient is on Treatment Plan : BREAST METASTATIC Fam-Trastuzumab Deruxtecan-nxki (Enhertu) (5.4) q21d     11/25/2022 -  Chemotherapy   Patient is on Treatment Plan : BREAST Capecitabine q21d       CURRENT THERAPY: Capecitabine  INTERVAL HISTORY:  [name] 47 y.o. female returns accompanied by our spanish interpreter [name] for f/u of her breast cancer on Capecitabine therapy.  She is taking dose reduced capecitabine at 3 tabs in the am and 2 tabs in the evening 2 weeks on and 1 week off.  She has completed 8 cycles of xeloda. Sheis happy to be done. She mostly complains today of fatigue, heavy eyes as if it hard to open. About a month ago, she had some  discharge from left eye, this has improved over time, it still looks red compared to right eye.  She otherwise had some back pain and had MRI of the spine ordered by our nurse practitioner which did not show any evidence of malignancy.  She once again hopes to lose this weight and wants to be referred to the medical weight loss clinic.  She has completed 3 months of physical therapy, wears a lymphedema sleeve at night but continues to have swelling starting around midday.  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) 04/01/2022   Encounter for dental examination 03/11/2022   Acute apical periodontitis 03/11/2022   Phobia of dental procedure 03/11/2022   Defective dental restoration 03/11/2022   Accretions on teeth 03/11/2022   Chronic periodontitis 03/11/2022   Teeth missing 03/11/2022  Caries 03/11/2022   Generalized gingival recession 03/11/2022   Pain, dental 03/08/2022   Cancer-related pain 03/08/2022   Port-A-Cath in place 02/15/2022   Oral allergy syndrome, subsequent encounter 02/03/2022   Adverse effect of other drugs, medicaments and biological substances, subsequent encounter 02/02/2022   Other adverse food reactions, not elsewhere classified, subsequent encounter 02/02/2022   Chest tightness 02/02/2022   Other allergic rhinitis 02/02/2022   Allergic conjunctivitis of both eyes 02/02/2022   Language barrier 12/11/2020   Morbid obesity (HCC)    History of breast cancer    Genetic testing 02/13/2020   Malignant neoplasm of upper-outer quadrant of left breast in female, estrogen receptor positive (HCC) 01/27/2020   Impaired ability to use community  resources due to language barrier 07/13/2013    is allergic to omnipaque [iohexol], doxorubicin hcl, keytruda [pembrolizumab], other, tape, and wound dressing adhesive.  MEDICAL HISTORY: Past Medical History:  Diagnosis Date   Allergy    Asthma    Breast cancer in female [facility])    Left   Breast disorder    breast cancer May 2021   Chronic back pain    Headache    High triglycerides    History of breast cancer    History of COVID-19 04/20/2021   Hx of migraines    Morbid obesity (HCC)    Personal history of chemotherapy    Personal history of radiation therapy     SURGICAL HISTORY: Past Surgical History:  Procedure Laterality Date   BREAST LUMPECTOMY     BREAST LUMPECTOMY WITH RADIOACTIVE SEED AND SENTINEL LYMPH NODE BIOPSY Left 07/15/2020   Procedure: LEFT BREAST LUMPECTOMY WITH RADIOACTIVE SEED AND LEFT AXILLARY SENTINEL LYMPH NODE BIOPSY, LEFT AXILLARY NODE RADIOACTIVE SEED GUIDED EXCISION, LEFT BREAST RADIOACTIVE SEED GUIDED EXCISION IM NODE;  Surgeon: Emelia Loron, MD;  Location: [facility] OR;  Service: General;  Laterality: Left;  PEC BLOCK   CESAREAN SECTION WITH BILATERAL TUBAL LIGATION Bilateral 07/23/2015   Procedure: CESAREAN SECTION WITH BILATERAL TUBAL LIGATION;  Surgeon: Reva Bores, MD;  Location: [facility]S;  Service: Obstetrics;  Laterality: Bilateral;   IR IMAGING GUIDED PORT INSERTION  02/11/2022   MODIFIED MASTECTOMY Left 08/22/2022   Procedure: LEFT MODIFIED RADICAL MASTECTOMY, LEFT AXILLARY NODE DISSECTION, AND SKIN BIOPSY OF BACK;  Surgeon: Emelia Loron, MD;  Location: [facility];  Service: General;  Laterality: Left;   PORT-A-CATH REMOVAL N/A 04/30/2021   Procedure: REMOVAL PORT-A-CATH;  Surgeon: Emelia Loron, MD;  Location: [facility];  Service: General;  Laterality: N/A;   PORTACATH PLACEMENT Right 02/11/2020   Procedure: INSERTION PORT-A-CATH WITH ULTRASOUND GUIDANCE;  Surgeon: Emelia Loron, MD;  Location: [facility];  Service: General;  Laterality: Right;   TUBAL LIGATION      SOCIAL HISTORY: Social History   Socioeconomic History   Marital status: Legally Separated    Spouse name: Not on file   Number of children: 3   Years of education: Not on file   Highest education level: 9th grade  Occupational History   Occupation: Holiday representative  Tobacco Use   Smoking status: Never   Smokeless tobacco: Never  Vaping Use   Vaping status: Never Used  Substance and Sexual Activity   Alcohol use: No   Drug use: Never   Sexual activity: Yes    Birth control/protection: Surgical  Other Topics Concern   Not on file  Social History Narrative   Not on file   Social Determinants of Corporate investment banker  Strain: Not on file  Food Insecurity: No Food Insecurity (10/11/2022)   Hunger Vital Sign    Worried About Running Out of Food in the Last Year: Never true    Ran Out of Food in the Last Year: Never true  Transportation Needs: No Transportation Needs (10/11/2022)   PRAPARE - Administrator, Civil Service (Medical): No    Lack of Transportation (Non-Medical): No  Physical Activity: Not on file  Stress: Not on file  Social Connections: Not on file  Intimate Partner Violence: Not At Risk (10/11/2022)   Humiliation, Afraid, Rape, and Kick questionnaire    Fear of Current or Ex-Partner: No    Emotionally Abused: No    Physically Abused: No    Sexually Abused: No    FAMILY HISTORY: Family History  Problem Relation Age of Onset   Heart disease Maternal Grandmother     Review of Systems  Constitutional:  Positive for fatigue. Negative for appetite change, chills, fever and unexpected weight change.  HENT:   Negative for hearing loss, lump/mass and trouble swallowing.   Eyes:  Positive for eye problems. Negative for icterus.  Respiratory:  Negative for chest tightness, cough and shortness of breath.   Cardiovascular:  Negative for chest pain, leg swelling and palpitations.   Gastrointestinal:  Negative for abdominal distention, abdominal pain, constipation, diarrhea, nausea and vomiting.  Endocrine: Negative for hot flashes.  Genitourinary:  Negative for difficulty urinating.   Musculoskeletal:  Positive for back pain. Negative for arthralgias.  Skin:  Negative for itching and rash.  Neurological:  Negative for dizziness, extremity weakness, headaches and numbness.  Hematological:  Negative for adenopathy. Does not bruise/bleed easily.  Psychiatric/Behavioral:  Negative for depression. The patient is not nervous/anxious.     PHYSICAL EXAMINATION  Vitals:   05/25/23 1240  BP: 114/62  Pulse: 72  Resp: 16  Temp: 97.7 F (36.5 C)  SpO2: 99%    Physical Exam Constitutional:      General: She is not in acute distress.    Appearance: Normal appearance. She is not toxic-appearing.  HENT:     Head: Normocephalic and atraumatic.     Mouth/Throat:     Mouth: Mucous membranes are moist.     Pharynx: Oropharynx is clear. No oropharyngeal exudate or posterior oropharyngeal erythema.  Eyes:     General: No scleral icterus. Cardiovascular:     Rate and Rhythm: Normal rate and regular rhythm.     Pulses: Normal pulses.     Heart sounds: Normal heart sounds.  Pulmonary:     Effort: Pulmonary effort is normal.     Breath sounds: Normal breath sounds.  Chest:     Comments: She is status post left mastectomy, no concern for recurrence.  No regional adenopathy.  No skin changes. Abdominal:     General: Abdomen is flat. Bowel sounds are normal. There is no distension.     Palpations: Abdomen is soft.     Tenderness: There is no abdominal tenderness.  Musculoskeletal:        General: No swelling.     Cervical back: Neck supple.  Lymphadenopathy:     Cervical: No cervical adenopathy.  Skin:    General: Skin is warm and dry.     Findings: No rash.  Neurological:     General: No focal deficit present.     Mental Status: She is alert.  Psychiatric:         Mood and Affect: Mood normal.  Behavior: Behavior normal.     LABORATORY DATA:  CBC    Component Value Date/Time   WBC 4.3 05/25/2023 1224   RBC 3.58 (L) 05/25/2023 1224   HGB 12.2 05/25/2023 1224   HGB 11.6 (L) 07/20/2022 0856   HGB 13.3 10/01/2021 1004   HCT 35.2 (L) 05/25/2023 1224   HCT 40.4 10/01/2021 1004   PLT 150 05/25/2023 1224   PLT 220 07/20/2022 0856   PLT 212 10/01/2021 1004   MCV 98.3 05/25/2023 1224   MCV 87 10/01/2021 1004   MCH 34.1 (H) 05/25/2023 1224   MCHC 34.7 05/25/2023 1224   RDW 13.8 05/25/2023 1224   RDW 13.3 10/01/2021 1004   LYMPHSABS 1.3 05/25/2023 1224   LYMPHSABS 1.6 10/01/2021 1004   MONOABS 0.4 05/25/2023 1224   EOSABS 0.1 05/25/2023 1224   EOSABS 0.1 10/01/2021 1004   BASOSABS 0.0 05/25/2023 1224   BASOSABS 0.0 10/01/2021 1004    CMP     Component Value Date/Time   NA 139 05/25/2023 1224   NA 138 09/30/2022 1149   K 3.8 05/25/2023 1224   CL 107 05/25/2023 1224   CO2 28 05/25/2023 1224   GLUCOSE 116 (H) 05/25/2023 1224   BUN 9 05/25/2023 1224   BUN 11 09/30/2022 1149   CREATININE 0.72 05/25/2023 1224   CREATININE 0.78 12/23/2022 0959   CALCIUM 9.4 05/25/2023 1224   PROT 6.9 05/25/2023 1224   PROT 7.1 09/30/2022 1149   ALBUMIN 4.1 05/25/2023 1224   ALBUMIN 4.5 09/30/2022 1149   AST 21 05/25/2023 1224   AST 18 12/23/2022 0959   ALT 16 05/25/2023 1224   ALT 13 12/23/2022 0959   ALKPHOS 97 05/25/2023 1224   BILITOT 0.6 05/25/2023 1224   BILITOT 0.5 12/23/2022 0959   GFRNONAA >60 05/25/2023 1224   GFRNONAA >60 12/23/2022 0959   GFRAA >60 06/03/2020 0949   GFRAA >60 02/26/2020 0915    ASSESSMENT and THERAPY PLAN:   Malignant neoplasm of upper-outer quadrant of left breast in female, estrogen receptor positive (HCC) Ralph is a 47 year old Spanish-speaking woman who has a 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  She was most recently seen for ongoing breast redness, given a course of antibiotics but had  persistent redness hence we proceeded with a punch biopsy.  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  Pathology was sent to [facility] for second opinion as well. This confirmed triple negative breast cancer. Repeat biopsy demonstrated her HER2 to be 1+.   She progressed on after 2 cycles of TC neoadjuvantly hence we have switched her treatment to Enhertu.   Given locally advanced and unresectable breast cancer and the progression on TC and previous exposure to Adriamycin based regimen, we decided to try Enhertu for HER2 low disease and she had robust response.  She is now status post left mastectomy and axillary resection and there is no evidence of residual tumor, complete pathologic response obtained.  Since this is a recurrent breast cancer and high risk for systemic disease, I have discussed about adjuvant Xeloda based on the create X trial, she is now on adjuvant xeloda.  She has completed 8 cycles of adjuvant Xeloda.  Residual review of systems pertinent for fatigue, some eye problems such as swelling and discharge which have significantly improved, hand-foot syndrome mild.  At this time given her high risk for recurrence, recommended considering [facility] for MRD testing, periodic imaging  every 6 months.  This has been ordered for December.  I referred her to medical weight loss clinic given the significant weight gain.  She has already completed physical therapy, have encouraged her to continue wearing lymphedema sleeve and continue exercises.  Will plan to see her back in about 3 months.  No concerns on labs from today.   All questions were answered. The patient knows to call the clinic with any problems, questions or concerns. We can certainly see the patient much sooner if necessary.   Time spent: 30 min  *Total Encounter Time as defined by the Centers for Medicare and Medicaid Services  includes, in addition to the face-to-face time of a patient visit (documented in the note above) non-face-to-face time: obtaining and reviewing outside history, ordering and reviewing medications, tests or procedures, care coordination (communications with other health care professionals or caregivers) and documentation in the medical record.

## 2023-05-25 NOTE — Assessment & Plan Note (Signed)
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  She was most recently seen for ongoing breast redness, given a course of antibiotics but had persistent redness hence we proceeded with a punch biopsy.  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  Pathology was sent to [facility] for second opinion as well. This confirmed triple negative breast cancer. Repeat biopsy demonstrated her HER2 to be 1+.   She progressed on after 2 cycles of TC neoadjuvantly hence we have switched her treatment to Enhertu.   Given locally advanced and unresectable breast cancer and the progression on TC and previous exposure to Adriamycin based regimen, we decided to try Enhertu for HER2 low disease and she had robust response.  She is now status post left mastectomy and axillary resection and there is no evidence of residual tumor, complete pathologic response obtained.  Since this is a recurrent breast cancer and high risk for systemic disease, I have discussed about adjuvant Xeloda based on the create X trial, she is now on adjuvant xeloda.  She has completed 8 cycles of adjuvant Xeloda.  Residual review of systems pertinent for fatigue, some eye problems such as swelling and discharge which have significantly improved, hand-foot syndrome mild.  At this time given her high risk for recurrence, recommended considering [facility] for MRD testing, periodic imaging every 6 months.  This has been ordered for December.  I referred her to medical weight loss clinic given the significant weight gain.  She has already completed physical therapy, have encouraged her to continue wearing lymphedema sleeve and continue exercises.  Will plan to see her back in about 3 months.  No concerns on labs from today.

## 2023-05-26 ENCOUNTER — Other Ambulatory Visit: Payer: Self-pay

## 2023-05-26 ENCOUNTER — Other Ambulatory Visit: Payer: Self-pay | Admitting: Hematology and Oncology

## 2023-05-26 DIAGNOSIS — Z170 Estrogen receptor positive status [ER+]: Secondary | ICD-10-CM

## 2023-05-26 NOTE — Progress Notes (Signed)
Per MD request, Edelmiro Innocent, LPN successfully faxed [facility] request to [phone]

## 2023-06-25 ENCOUNTER — Other Ambulatory Visit: Payer: Self-pay | Admitting: Nurse Practitioner

## 2023-06-26 NOTE — Telephone Encounter (Signed)
Please advise KH 

## 2023-06-27 ENCOUNTER — Encounter: Payer: Self-pay | Admitting: Hematology and Oncology

## 2023-06-27 NOTE — Telephone Encounter (Signed)
TC

## 2023-07-06 ENCOUNTER — Inpatient Hospital Stay: Payer: No Typology Code available for payment source | Attending: Hematology and Oncology

## 2023-07-06 ENCOUNTER — Other Ambulatory Visit: Payer: Self-pay

## 2023-07-06 DIAGNOSIS — Z452 Encounter for adjustment and management of vascular access device: Secondary | ICD-10-CM | POA: Insufficient documentation

## 2023-07-06 DIAGNOSIS — C50412 Malignant neoplasm of upper-outer quadrant of left female breast: Secondary | ICD-10-CM | POA: Insufficient documentation

## 2023-07-06 DIAGNOSIS — Z95828 Presence of other vascular implants and grafts: Secondary | ICD-10-CM

## 2023-07-06 MED ORDER — HEPARIN SOD (PORK) LOCK FLUSH 100 UNIT/ML IV SOLN
500.0000 [IU] | Freq: Once | INTRAVENOUS | Status: AC
  Administered 2023-07-06: 500 [IU]

## 2023-07-06 MED ORDER — SODIUM CHLORIDE 0.9% FLUSH
10.0000 mL | Freq: Once | INTRAVENOUS | Status: AC
Start: 2023-07-06 — End: 2023-07-06
  Administered 2023-07-06: 10 mL

## 2023-07-13 ENCOUNTER — Ambulatory Visit (INDEPENDENT_AMBULATORY_CARE_PROVIDER_SITE_OTHER): Payer: Self-pay | Admitting: Family Medicine

## 2023-07-13 ENCOUNTER — Encounter (INDEPENDENT_AMBULATORY_CARE_PROVIDER_SITE_OTHER): Payer: Self-pay | Admitting: Family Medicine

## 2023-07-13 VITALS — BP 109/74 | HR 87 | Temp 98.1°F | Ht 61.0 in | Wt 186.0 lb

## 2023-07-13 DIAGNOSIS — E7849 Other hyperlipidemia: Secondary | ICD-10-CM

## 2023-07-13 DIAGNOSIS — K760 Fatty (change of) liver, not elsewhere classified: Secondary | ICD-10-CM

## 2023-07-13 DIAGNOSIS — Z0289 Encounter for other administrative examinations: Secondary | ICD-10-CM

## 2023-07-13 DIAGNOSIS — C50912 Malignant neoplasm of unspecified site of left female breast: Secondary | ICD-10-CM

## 2023-07-13 DIAGNOSIS — Z6835 Body mass index (BMI) 35.0-35.9, adult: Secondary | ICD-10-CM

## 2023-07-13 NOTE — Progress Notes (Signed)
Monica Grippe, DO, ABFM, ABOM Bariatric physician [address] [place], [place], [place] [address] Office: [phone]  /  Fax: [phone]   Initial Evaluation: [name] was seen in clinic today to evaluate for obesity. She was accompanied by interpreter [name]. She is interested in losing weight to improve overall health and reduce the risk of weight related complications. She presents today to review program treatment options, initial physical assessment, and evaluation.     She was referred by: Dr.Iruku (Oncologist)  When asked how has your weight affected you? She states: Contributed to medical problems  Contributing factors to her weight change: Nutritional, Stress, Reduced Physical Activity, Life Event (3rd child,  breast cancer diagnosis in 2021).    Some associated conditions: HLD, fatty liver, breast cancer  Current nutrition plan: None  Current level of physical activity: None  Current or previous pharmacotherapy: None  Response to medication: Never tried medications   @MEDCOMM @    Past Medical History:  Diagnosis Date   Allergy    Asthma    Breast cancer in female [facility])    Left   Breast disorder    breast cancer May 2021   Chronic back pain    Headache    High triglycerides    History of breast cancer    History of COVID-19 04/20/2021   Hx of migraines    Morbid obesity (HCC)    Personal history of chemotherapy    Personal history of radiation therapy     Objective:   BP 109/74   Pulse 87   Temp 98.1 F (36.7 C)   Ht 5\' 1"  (1.549 m)   Wt 186 lb (84.4 kg)   LMP 01/11/2019 (Exact Date)   SpO2 96%   BMI 35.14 kg/m  She was weighed on the bioimpedance scale: Body mass index is 35.14 kg/m.  Visceral Fat %: 10 Body Fat %: 39.5  No data recorded No data recorded   Vitals Temp: 98.1 F (36.7 C) BP: 109/74 Pulse Rate: 87 SpO2: 96 %   Anthropometric Measurements Height: 5\' 1"  (1.549 m) Weight: 186 lb (84.4 kg) BMI  (Calculated): 35.16   Body Composition  Body Fat %: 39.5 % Fat Mass (lbs): 73.8 lbs Muscle Mass (lbs): 107 lbs Total Body Water (lbs): 75.2 lbs Visceral Fat Rating : 10   Other Clinical Data Comments: info session   General: Well Developed, well nourished, and in no acute distress.  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 She underwent a left breast biposy 01/23/20, which showed invasive ductal carcinoma with confirmed matastatic disease to the left axillary lypmh node. She subsequently underwent chemo & radiation therapy with good effect -  ended in 12/2020. Repeat mammogram  on 03/2022 showed regrowth. Pt subsequently underwent more chemotherapy and ultimately had left breast mastectomy on 12/23 with further chemotherapy afterwards. Additionally, on 01/18/2022, it was found that the left breast cancer metastasized to the skin; pt  taking Capecitabine q21d for this. She is followed regularly by Dr.Iruku with LOV on 05/25/2023.   Discussed with pt that having an elevated BMI creates more inflammation in the  body and may increase increase risk of many types of cancers. Discussed how female cancers are especially sensitive to increased levels of obesity. Starting goal: lose 5-10% of body weight. Continue with f/up and all treatment per Dr.Iruku.    Other hyperlipidemia Assessment & Plan: Lab Results  Component Value Date   CHOL 156 02/03/2023   HDL 41 02/03/2023   LDLCALC 79 02/03/2023   TRIG 216 (H) 02/03/2023   CHOLHDL 3.8 02/03/2023   HLD treated with Zocor 20 mg daily. Pt tolerating medication  well without any adverse SE. I reviewed pt's most recent lipid panel - showed elevated TRIG of 216.   Discussed with pt that lifestyle modification such as following a heart-healthy, low cholesterol meal plan can improve TRIG. Continue with Zocor per PCP.    NAFLD (nonalcoholic fatty liver disease) Assessment & Plan: On 02/14/23, pt had CT Chest Abdomen Pelvis which showed mild hepatic steatosis.   Treatment goal: 7-10% reduction of body weight. Treatment includes weight loss, elimination of sweet drinks, including juice, avoidance of high fructose corn syrup, and exercise. As always, avoiding alcohol consumption is important.   BMI 35.0-35.9,adult-35.16 Morbid obesity (HCC) Assessment & Plan: We reviewed weight, biometrics, associated medical conditions and contributing factors with patient. she would benefit from weight loss therapy via a modified calorie, low-carb, high-protein nutritional plan tailored to their REE (resting energy expenditure) which will be determined by indirect calorimetry at their next office visit.    Action Plan: she was weighed on the bioimpedance scale and results were discussed and documented in the synopsis.   [name] will complete provided nutritional and psychosocial assessment questionnaire before the next appointment.  she will be scheduled for indirect calorimetry to determine resting energy expenditure in a fasting state.  This will allow [place] to create a reduced calorie, high-protein meal plan to promote loss of fat mass while preserving muscle mass.  We will also assess for cardiometabolic risk and nutritional derangements via an ECG and fasting serologies at her next appointment.  she was encouraged to work on amassing support from family and friends to begin their weight loss journey.   Work on eliminating or reducing the presence of highly processed, poorly nutritious, calorie-dense foods in the home.   Obesity Education Performed  Today:  Patient was counseled on nutritional approaches to weight loss and benefits of reducing processed foods and consuming plant-based foods and high quality protein as part of nutritional weight management program.   We discussed the importance of long term lifestyle changes which include nutrition, exercise and behavioral modifications as well as the importance of customizing this to her specific health and social needs.   We discussed the benefits of reaching a healthier weight to alleviate the symptoms of existing conditions and reduce the risks of the biomechanical, metabolic and psychological effects of obesity.  Was counseled on the health benefits of losing 5%-10% of total body weight.  Was counseled on our cognitive behavorial therapy program, lead by our bariatric psychologist, who focuses on emotional eating and creating positive behavorial change.  Was counseled on bariatric pharmacotherapy and how this may be used as an adjunct in their weight management   [name] appears to be in the action stage of change and states they are ready to start intensive lifestyle modifications and behavioral modifications.  It was recommended that she follow up in the next 1-2 weeks to review the above steps, and to continue with treatment of their chronic disease state of obesity  Attestations:   Reviewed by clinician on day of visit: allergies, medications, problem list, medical history, surgical history, family history, social history, and previous encounter notes pertinent to obesity diagnosis. 80 minutes was spent today on this visit including the above counseling, pre-visit chart review, and post-visit documentation. This included extra time with interpreter and pt's extensive medical history to include office visits, labs, imaging, etc.   Over 50% of this time was spent in direct, face-to-face counseling and coordination of care  I, Special Randolm Idol , acting as a medical scribe for Thomasene Lot, DO., have compiled all relevant documentation for today's office visit on behalf of Thomasene Lot, DO, while in the presence of [employer], DO.  I have reviewed the above documentation for accuracy and completeness, and I agree with the above. [name], D.O.  The 21st Century Cures Act was signed into law in 2016 which includes the topic of electronic health records.  This provides immediate access to information in MyChart.  This includes consultation notes, operative notes, office notes, lab results and pathology reports.  If you have any questions about what you read please let us know at your next visit so we can discuss your concerns and take corrective action if need be.  We are right here with you!

## 2023-07-17 LAB — SIGNATERA ONLY (NATERA MANAGED)
SIGNATERA MTM READOUT: 0 MTM/ml
SIGNATERA TEST RESULT: NEGATIVE

## 2023-07-20 ENCOUNTER — Other Ambulatory Visit: Payer: Self-pay

## 2023-07-30 ENCOUNTER — Other Ambulatory Visit: Payer: Self-pay

## 2023-08-08 ENCOUNTER — Encounter (INDEPENDENT_AMBULATORY_CARE_PROVIDER_SITE_OTHER): Payer: Self-pay | Admitting: Internal Medicine

## 2023-08-08 ENCOUNTER — Ambulatory Visit (INDEPENDENT_AMBULATORY_CARE_PROVIDER_SITE_OTHER): Payer: Self-pay | Admitting: Internal Medicine

## 2023-08-08 VITALS — BP 108/73 | HR 69 | Temp 98.0°F | Ht 61.0 in | Wt 186.0 lb

## 2023-08-08 DIAGNOSIS — Z170 Estrogen receptor positive status [ER+]: Secondary | ICD-10-CM

## 2023-08-08 DIAGNOSIS — R739 Hyperglycemia, unspecified: Secondary | ICD-10-CM

## 2023-08-08 DIAGNOSIS — R0602 Shortness of breath: Secondary | ICD-10-CM

## 2023-08-08 DIAGNOSIS — R5383 Other fatigue: Secondary | ICD-10-CM

## 2023-08-08 DIAGNOSIS — K760 Fatty (change of) liver, not elsewhere classified: Secondary | ICD-10-CM

## 2023-08-08 DIAGNOSIS — Z6835 Body mass index (BMI) 35.0-35.9, adult: Secondary | ICD-10-CM

## 2023-08-08 DIAGNOSIS — Z1331 Encounter for screening for depression: Secondary | ICD-10-CM

## 2023-08-08 DIAGNOSIS — E66812 Obesity, class 2: Secondary | ICD-10-CM

## 2023-08-08 DIAGNOSIS — C50412 Malignant neoplasm of upper-outer quadrant of left female breast: Secondary | ICD-10-CM

## 2023-08-08 NOTE — Assessment & Plan Note (Addendum)
Suggested on recent CT of abdomen in 2024, previous CT in 2023 did not show hepatic steatosis.  She will be screened for glycemic disorders during this visit.  She does not drink alcohol and is no longer on steatogenic medication.  Recent CMP showed normal liver enzymes bilirubin and albumin.  Losing 10 to 15% of body weight may improve condition.  Also reducing saturated fats and processed sugars from diet.

## 2023-08-08 NOTE — Assessment & Plan Note (Signed)
Patient started to gain weight following chemotherapy likely due to disruption of her energy regulation system and changes in fat mass set point.  She is also been eating more carbs, her diet is also low in protein and whole foods.  Physical activity levels are also low.  She will work on implementing supervised medical weight loss plan.  See obesity treatment plan

## 2023-08-08 NOTE — Assessment & Plan Note (Signed)
I reviewed recent CMP's patient reports being fasting but she is not sure, she does drink sugared coffee in the morning and craves carbohydrates.  I suspect she may have insulin resistance.  We will check a fasting blood glucose, A1c and fasting insulin levels today.  We discussed reducing simple and added sugars in her diet.  A significant amount of nutritional education was done today as part of her intake visit.

## 2023-08-08 NOTE — Assessment & Plan Note (Signed)
[name] has a history of triple negative breast cancer who is status post chemotherapy with associated weight gain likely due to disruption of energy regulation system accompanied by nutritional and physical activity changes.  She is followed by oncology and will follow-up accordingly.

## 2023-08-08 NOTE — Progress Notes (Signed)
Office: [phone]  /  Fax: [phone]   Subjective   Initial Visit  [name] (MR# [id number]) is an 47 y.o. female who presents for evaluation and treatment of obesity and related comorbidities. Current BMI is Body mass index is 35.14 kg/m. [name] has been struggling with her weight for many years and has been unsuccessful in either losing weight, maintaining weight loss, or reaching her healthy weight goal.  [name] is currently in the action stage of change and ready to dedicate time achieving and maintaining a healthier weight. [name] is interested in becoming our patient and working on intensive lifestyle modifications including (but not limited to) diet and exercise for weight loss.  When asked how their weight has affected their life and health, she states: Has affected self-esteem, Contributed to medical problems, Having fatigue, Having poor endurance, Problems with eating patterns, and Has affected mood   When asked what else they would like to accomplish? She states: Adopt healthier eating patterns, Improve energy levels and physical activity, Improve existing medical conditions, Improve quality of life, Improve appearance, Improve self-confidence, and Lose a target amount of weight : 50 lbs in 12 months.  Weight history:  She starting to note weight gain during : adulthood and after chemotherapy for cancer .  Life events associated with weight gain include : medical illness and starting a medication chemotherapy .   Other contributing factors: Disruption of circadian rhythm / sleep disordered breathing, Use of obesogenic medications: Other: recent chemo, Moderate to high levels of stress, Reduced physical activity, Mental health problems, and Menopause.  Their highest weight has been:  208 lbs.  Previous weight-loss programs : None.  Their maximum weight loss was:  na lbs.  Their greatest challenge with dieting: never tried dieting before.  Weight promoting  medications identified: None  Current or previous pharmacotherapy: Other: went to weight loss clinic and given a diet pill, no nutritional guidance .  Response to medication: Lost weight initially but was unable to sustain weight loss  Nutritional History:  Current nutrition plan: None.  How often do they eat breakfast : 3-5 days a week.  Number of times they eat per day: 2  What beverages do they drink: water, caffeinated beverages, and juice rarely.   Use of artificial sweetners : No  Food intolerances or restrictions: some fruit may cause throat discomfort after chemotherapy  Food triggers: Stress, Anger, When feeling guilty, and To help comfort.  Food cravings: Sugary, Starches, and High Fat  Current level of physical activity: None  Past medical history includes:   Past Medical History:  Diagnosis Date   Allergy    Asthma    Back pain    Breast cancer in female [facility])    Left   Breast disorder    breast cancer May 2021   Chronic back pain    Fatty liver    Headache    High triglycerides    History of breast cancer    History of COVID-19 04/20/2021   Hx of migraines    Morbid obesity (HCC)    Personal history of chemotherapy    Personal history of radiation therapy    SOB (shortness of breath)    Vitamin D deficiency      Objective   BP 108/73   Pulse 69   Temp 98 F (36.7 C)   Ht 5\' 1"  (1.549 m)   Wt 186 lb (84.4 kg)   LMP 01/11/2019 (Exact Date)   SpO2 98%  BMI 35.14 kg/m  She was weighed on the bioimpedance scale: Body mass index is 35.14 kg/m.    Anthropometrics:  Vitals Temp: 98 F (36.7 C) BP: 108/73 Pulse Rate: 69 SpO2: 98 %   Anthropometric Measurements Height: 5\' 1"  (1.549 m) Weight: 186 lb (84.4 kg) BMI (Calculated): 35.16 Starting Weight: 186 lb Waist Measurement : 45 inches   Body Composition  Body Fat %: 39.8 % Fat Mass (lbs): 74.2 lbs Muscle Mass (lbs): 106.8 lbs Total Body Water (lbs): 75 lbs Visceral Fat  Rating : 10   Other Clinical Data RMR: 1512 Fasting: Yes Labs: Yes Today's Visit #: 1 Starting Date: 08/08/23    Physical Exam:  General: She is overweight, cooperative, alert, well developed, and in no acute distress. PSYCH: Has normal mood, affect and thought process.   HEENT: EOMI, sclerae are anicteric. Lungs: Normal breathing effort, no conversational dyspnea. Extremities: No edema.  Neurologic: No gross sensory or motor deficits. No tremors or fasciculations noted.    Diagnostic Data Reviewed  EKG: Normal sinus rhythm, rate 59 BPM.  No signs of chamber enlargement.  Indirect Calorimeter completed today shows a VO2 of 219 and a REE of 1512.  Her calculated basal metabolic rate is 6962 thus her resting energy expenditure same as calculated.  Depression Screen  Shir's PHQ-9 score was: 16.     02/03/2023   10:18 AM  Depression screen PHQ 2/9  Decreased Interest 0  Down, Depressed, Hopeless 0  PHQ - 2 Score 0    Screening for Sleep Related Breathing Disorders  Jazzi admits to daytime somnolence and admits to waking up still tired. Patient has a history of symptoms of morning headache. [name] generally gets 6 to 8 hours of sleep per night, and states that she has difficulty falling asleep and nightime awakenings. Snoring is present. Apneic episodes are not present. Epworth Sleepiness Score is 6.   BMET    Component Value Date/Time   NA 139 05/25/2023 1224   NA 138 09/30/2022 1149   K 3.8 05/25/2023 1224   CL 107 05/25/2023 1224   CO2 28 05/25/2023 1224   GLUCOSE 116 (H) 05/25/2023 1224   BUN 9 05/25/2023 1224   BUN 11 09/30/2022 1149   CREATININE 0.72 05/25/2023 1224   CREATININE 0.78 12/23/2022 0959   CALCIUM 9.4 05/25/2023 1224   GFRNONAA >60 05/25/2023 1224   GFRNONAA >60 12/23/2022 0959   GFRAA >60 06/03/2020 0949   GFRAA >60 02/26/2020 0915   Lab Results  Component Value Date   HGBA1C 5.6 09/30/2022   HGBA1C 5.6 09/30/2022   HGBA1C 5.6 (A)  09/30/2022   HGBA1C 5.6 09/30/2022   HGBA1C 5.6 10/01/2021   No results found for: "INSULIN" CBC    Component Value Date/Time   WBC 4.3 05/25/2023 1224   RBC 3.58 (L) 05/25/2023 1224   HGB 12.2 05/25/2023 1224   HGB 11.6 (L) 07/20/2022 0856   HGB 13.3 10/01/2021 1004   HCT 35.2 (L) 05/25/2023 1224   HCT 40.4 10/01/2021 1004   PLT 150 05/25/2023 1224   PLT 220 07/20/2022 0856   PLT 212 10/01/2021 1004   MCV 98.3 05/25/2023 1224   MCV 87 10/01/2021 1004   MCH 34.1 (H) 05/25/2023 1224   MCHC 34.7 05/25/2023 1224   RDW 13.8 05/25/2023 1224   RDW 13.3 10/01/2021 1004   Iron/TIBC/Ferritin/ %Sat No results found for: "IRON", "TIBC", "FERRITIN", "IRONPCTSAT" Lipid Panel     Component Value Date/Time   CHOL 156 02/03/2023 1039  TRIG 216 (H) 02/03/2023 1039   HDL 41 02/03/2023 1039   CHOLHDL 3.8 02/03/2023 1039   LDLCALC 79 02/03/2023 1039   Hepatic Function Panel     Component Value Date/Time   PROT 6.9 05/25/2023 1224   PROT 7.1 09/30/2022 1149   ALBUMIN 4.1 05/25/2023 1224   ALBUMIN 4.5 09/30/2022 1149   AST 21 05/25/2023 1224   AST 18 12/23/2022 0959   ALT 16 05/25/2023 1224   ALT 13 12/23/2022 0959   ALKPHOS 97 05/25/2023 1224   BILITOT 0.6 05/25/2023 1224   BILITOT 0.5 12/23/2022 0959      Component Value Date/Time   TSH 4.500 11/04/2022 1045     Assessment and Plan   TREATMENT PLAN FOR OBESITY:  Recommended Dietary Goals  Xinyi is currently in the action stage of change. As such, her goal is to implement medically supervised weight loss plan.  She has agreed to implement: the Category 2 plan - 1200 kcal per day  Behavioral Intervention  We discussed the following Behavioral Modification Strategies today: increasing lean protein intake to established goals, decreasing simple carbohydrates , increasing vegetables, increasing lower glycemic fruits, increasing fiber rich foods, avoiding skipping meals, increasing water intake, work on meal planning and  preparation, reading food labels , keeping healthy foods at home, identifying sources and decreasing liquid calories, decreasing eating out or consumption of processed foods, and making healthy choices when eating convenient foods, planning for success, and better snacking choices  Additional resources provided today: None  Recommended Physical Activity Goals  Sabine has been advised to work up to 150 minutes of moderate intensity aerobic activity a week and strengthening exercises 2-3 times per week for cardiovascular health, weight loss maintenance and preservation of muscle mass.   She has agreed to :  Think about enjoyable ways to increase daily physical activity and overcoming barriers to exercise and Increase physical activity in their day and reduce sedentary time (increase NEAT).  Pharmacotherapy We will work on building a Therapist, art and behavioral strategies. We will discuss the role of pharmacotherapy as an adjunct at subsequent visits.   ASSOCIATED CONDITIONS ADDRESSED TODAY  Other Fatigue Nash admits to daytime somnolence and admits to waking up still tired. Patient has a history of symptoms of morning headache. Renesmee generally gets 6 to 8 hours of sleep per night, and states that she has difficulty falling asleep and nightime awakenings. Snoring is present. Apneic episodes are not present. Epworth Sleepiness Score is 6. Toniann Fail does feel that her weight is causing her energy to be lower than it should be. Fatigue may be related to obesity, depression or many other causes. Labs will be ordered, and in the meanwhile, Jaleeah will focus on self care including making healthy food choices, increasing physical activity and focusing on stress reduction.  Shortness of Breath Ayo notes increasing shortness of breath with exercising and seems to be worsening over time with weight gain. She notes getting out of breath sooner with activity than she used to. This has not  gotten worse recently. Atyana denies shortness of breath at rest or orthopnea.Jonnae notes increasing shortness of breath with exercising and seems to be worsening over time with weight gain. She notes getting out of breath sooner with activity than she used to. This has not gotten worse recently. Joetta denies shortness of breath at rest or orthopnea.  Other fatigue -     EKG 12-Lead -     Vitamin B12 -  CBC with Differential/Platelet  SOB (shortness of breath) on exertion  Depression screen  Malignant neoplasm of upper-outer quadrant of left breast in female, estrogen receptor positive (HCC) Assessment & Plan: Caress has a history of triple negative breast cancer who is status post chemotherapy with associated weight gain likely due to disruption of energy regulation system accompanied by nutritional and physical activity changes.  She is followed by oncology and will follow-up accordingly.   Elevated blood sugar Assessment & Plan: I reviewed recent CMP's patient reports being fasting but she is not sure, she does drink sugared coffee in the morning and craves carbohydrates.  I suspect she may have insulin resistance.  We will check a fasting blood glucose, A1c and fasting insulin levels today.  We discussed reducing simple and added sugars in her diet.  A significant amount of nutritional education was done today as part of her intake visit.  Orders: -     Insulin, random  Metabolic dysfunction-associated steatotic liver disease (MASLD) Assessment & Plan: Suggested on recent CT of abdomen in 2024, previous CT in 2023 did not show hepatic steatosis.  She will be screened for glycemic disorders during this visit.  She does not drink alcohol and is no longer on steatogenic medication.  Recent CMP showed normal liver enzymes bilirubin and albumin.  Losing 10 to 15% of body weight may improve condition.  Also reducing saturated fats and processed sugars from diet.  Orders: -      Comprehensive metabolic panel -     Hemoglobin A1c -     Lipid Panel With LDL/HDL Ratio  Class 2 severe obesity with serious comorbidity and body mass index (BMI) of 35.0 to 35.9 in adult, unspecified obesity type [facility]) Assessment & Plan: Patient started to gain weight following chemotherapy likely due to disruption of her energy regulation system and changes in fat mass set point.  She is also been eating more carbs, her diet is also low in protein and whole foods.  Physical activity levels are also low.  She will work on implementing supervised medical weight loss plan.  See obesity treatment plan  Orders: -     TSH -     VITAMIN D 25 Hydroxy (Vit-D Deficiency, Fractures)    Follow-up  She was informed of the importance of frequent follow-up visits to maximize her success with intensive lifestyle modifications for her multiple health conditions. She was informed we would discuss her lab results at her next visit unless there is a critical issue that needs to be addressed sooner. Leveah agreed to keep her next visit at the agreed upon time to discuss these results.  Attestation Statement  This is the patient's intake visit at [facility]. The patient's Health Questionnaire was reviewed at length. Included in the packet: current and past health history, medications, allergies, ROS, gynecologic history (women only), surgical history, family history, social history, weight history, weight loss surgery history (for those that have had weight loss surgery), nutritional evaluation, mood and food questionnaire, PHQ9, Epworth questionnaire, sleep habits questionnaire, patient life and health improvement goals questionnaire. These will all be scanned into the patient's chart under media.   During the visit, I independently reviewed the patient's EKG, bioimpedance scale results, and indirect calorimetry results. I used this information to medically tailor a meal plan for the patient that  will help her to lose weight and will improve her obesity-related conditions. I performed a medically necessary appropriate examination and/or evaluation. I discussed the assessment  and treatment plan with the patient. The patient was provided an opportunity to ask questions and all were answered. The patient agreed with the plan and demonstrated an understanding of the instructions. Labs were ordered at this visit and will be reviewed at the next visit unless critical results need to be addressed immediately. Clinical information was updated and documented in the EMR.   Reviewed by clinician on day of visit: allergies, medications, problem list, medical history, surgical history, family history, social history, and previous encounter notes.  I have spent 60 minutes in the care of the patient today including: preparing to see patient (e.g. review and interpretation of tests, old notes ), obtaining and/or reviewing separately obtained history, performing a medically appropriate examination or evaluation, counseling and educating the patient, ordering medications, test or procedures, documenting clinical information in the electronic or other health care record, and independently interpreting results and communicating results to the patient, family, or caregiver      Worthy Rancher, MD

## 2023-08-09 ENCOUNTER — Encounter: Payer: Self-pay | Admitting: Hematology and Oncology

## 2023-08-09 LAB — LIPID PANEL WITH LDL/HDL RATIO
Cholesterol, Total: 246 mg/dL — ABNORMAL HIGH (ref 100–199)
HDL: 50 mg/dL (ref >39)
LDL Chol Calc (NIH): 175 mg/dL — ABNORMAL HIGH (ref 0–99)
LDL/HDL Ratio: 3.5 {ratio} — ABNORMAL HIGH (ref 0.0–3.2)
Triglycerides: 118 mg/dL (ref 0–149)
VLDL Cholesterol Cal: 21 mg/dL (ref 5–40)

## 2023-08-09 LAB — CBC WITH DIFFERENTIAL/PLATELET
Basophils Absolute: 0 10*3/uL (ref 0.0–0.2)
Basos: 1 %
EOS (ABSOLUTE): 0.2 10*3/uL (ref 0.0–0.4)
Eos: 4 %
Hematocrit: 41.4 % (ref 34.0–46.6)
Hemoglobin: 13.5 g/dL (ref 11.1–15.9)
Immature Grans (Abs): 0 10*3/uL (ref 0.0–0.1)
Immature Granulocytes: 1 %
Lymphocytes Absolute: 1.3 10*3/uL (ref 0.7–3.1)
Lymphs: 30 %
MCH: 29.7 pg (ref 26.6–33.0)
MCHC: 32.6 g/dL (ref 31.5–35.7)
MCV: 91 fL (ref 79–97)
Monocytes Absolute: 0.3 10*3/uL (ref 0.1–0.9)
Monocytes: 7 %
Neutrophils Absolute: 2.4 10*3/uL (ref 1.4–7.0)
Neutrophils: 57 %
Platelets: 193 10*3/uL (ref 150–450)
RBC: 4.54 x10E6/uL (ref 3.77–5.28)
RDW: 12 % (ref 11.7–15.4)
WBC: 4.2 10*3/uL (ref 3.4–10.8)

## 2023-08-09 LAB — COMPREHENSIVE METABOLIC PANEL
ALT: 16 [IU]/L (ref 0–32)
AST: 21 [IU]/L (ref 0–40)
Albumin: 4.3 g/dL (ref 3.9–4.9)
Alkaline Phosphatase: 109 [IU]/L (ref 44–121)
BUN/Creatinine Ratio: 17 (ref 9–23)
BUN: 12 mg/dL (ref 6–24)
Bilirubin Total: 0.3 mg/dL (ref 0.0–1.2)
CO2: 24 mmol/L (ref 20–29)
Calcium: 9.9 mg/dL (ref 8.7–10.2)
Chloride: 101 mmol/L (ref 96–106)
Creatinine, Ser: 0.7 mg/dL (ref 0.57–1.00)
Globulin, Total: 2.5 g/dL (ref 1.5–4.5)
Glucose: 94 mg/dL (ref 70–99)
Potassium: 4.4 mmol/L (ref 3.5–5.2)
Sodium: 139 mmol/L (ref 134–144)
Total Protein: 6.8 g/dL (ref 6.0–8.5)
eGFR: 107 mL/min/{1.73_m2} (ref >59)

## 2023-08-09 LAB — TSH: TSH: 3.75 u[IU]/mL (ref 0.450–4.500)

## 2023-08-09 LAB — HEMOGLOBIN A1C
Est. average glucose Bld gHb Est-mCnc: 123 mg/dL
Hgb A1c MFr Bld: 5.9 % — ABNORMAL HIGH (ref 4.8–5.6)

## 2023-08-09 LAB — VITAMIN D 25 HYDROXY (VIT D DEFICIENCY, FRACTURES): Vit D, 25-Hydroxy: 36.6 ng/mL (ref 30.0–100.0)

## 2023-08-09 LAB — INSULIN, RANDOM: INSULIN: 14.4 u[IU]/mL (ref 2.6–24.9)

## 2023-08-09 LAB — VITAMIN B12: Vitamin B-12: 446 pg/mL (ref 232–1245)

## 2023-08-13 ENCOUNTER — Other Ambulatory Visit: Payer: Self-pay | Admitting: Nurse Practitioner

## 2023-08-22 ENCOUNTER — Encounter (INDEPENDENT_AMBULATORY_CARE_PROVIDER_SITE_OTHER): Payer: Self-pay | Admitting: Internal Medicine

## 2023-08-22 ENCOUNTER — Other Ambulatory Visit (HOSPITAL_COMMUNITY): Payer: Self-pay

## 2023-08-22 ENCOUNTER — Encounter: Payer: Self-pay | Admitting: Hematology and Oncology

## 2023-08-22 ENCOUNTER — Ambulatory Visit (INDEPENDENT_AMBULATORY_CARE_PROVIDER_SITE_OTHER): Payer: Self-pay | Admitting: Internal Medicine

## 2023-08-22 ENCOUNTER — Other Ambulatory Visit: Payer: Self-pay | Admitting: *Deleted

## 2023-08-22 VITALS — BP 110/72 | HR 85 | Temp 97.9°F | Ht 61.0 in | Wt 187.0 lb

## 2023-08-22 DIAGNOSIS — Z6835 Body mass index (BMI) 35.0-35.9, adult: Secondary | ICD-10-CM

## 2023-08-22 DIAGNOSIS — K760 Fatty (change of) liver, not elsewhere classified: Secondary | ICD-10-CM

## 2023-08-22 DIAGNOSIS — E66812 Obesity, class 2: Secondary | ICD-10-CM

## 2023-08-22 DIAGNOSIS — E7800 Pure hypercholesterolemia, unspecified: Secondary | ICD-10-CM

## 2023-08-22 DIAGNOSIS — Z5971 Insufficient health insurance coverage: Secondary | ICD-10-CM

## 2023-08-22 DIAGNOSIS — R638 Other symptoms and signs concerning food and fluid intake: Secondary | ICD-10-CM

## 2023-08-22 DIAGNOSIS — R7303 Prediabetes: Secondary | ICD-10-CM

## 2023-08-22 MED ORDER — METFORMIN HCL ER 500 MG PO TB24
500.0000 mg | ORAL_TABLET | Freq: Two times a day (BID) | ORAL | 0 refills | Status: DC
  Filled 2023-08-22: qty 60, 30d supply, fill #0

## 2023-08-22 MED ORDER — PREDNISONE 50 MG PO TABS
ORAL_TABLET | ORAL | 1 refills | Status: DC
  Filled 2023-08-22: qty 3, 1d supply, fill #0

## 2023-08-22 NOTE — Assessment & Plan Note (Signed)
Most recent A1c is  Lab Results  Component Value Date   HGBA1C 5.9 (H) 08/08/2023   HGBA1C 5.6 10/01/2021    Patient aware of disease state and risk of progression. This may contribute to abnormal cravings, fatigue and diabetic complications without having diabetes.   We have discussed treatment options which include: losing 7 to 10% of body weight, increasing physical activity to a goal of 150 minutes a week at moderate intensity.  Advised to maintain a diet low on simple and processed carbohydrates.  Patient received again education on carb insulin model and sources of carbohydrates today.  After discussion of benefits and side effects she will be started on metformin XR 500 mg twice daily.

## 2023-08-22 NOTE — Assessment & Plan Note (Signed)
 See obesity treatment plan

## 2023-08-22 NOTE — Telephone Encounter (Signed)
Per review with MD

## 2023-08-22 NOTE — Progress Notes (Signed)
Office: [phone]  /  Fax: [phone]  Weight Summary And Biometrics  Vitals Temp: 97.9 F (36.6 C) BP: 110/72 Pulse Rate: 85 SpO2: 99 %   Anthropometric Measurements Height: 5\' 1"  (1.549 m) Weight: 187 lb (84.8 kg) BMI (Calculated): 35.35 Weight at Last Visit: 0 lb Weight Lost Since Last Visit: 186 lb Weight Gained Since Last Visit: 1 lb Starting Weight: 186 lb Total Weight Loss (lbs): 0 lb (0 kg)   Body Composition  Body Fat %: 38.9 % Fat Mass (lbs): 73 lbs Muscle Mass (lbs): 108.8 lbs Total Body Water (lbs): 74 lbs Visceral Fat Rating : 10    RMR: 1512  Today's Visit #: 2  Starting Date: 08/08/23   Subjective   Chief Complaint: Obesity  Rittany is here to discuss her progress with her obesity treatment plan. She is on the the Category 2 Plan and states she is following her eating plan approximately 50 % of the time. She states she is not exercising.  Interval History:   Since last office visit she has gained 1 pounds. She reports suboptimal adherence She has been working on working on gradual implementation of reduced calorie nutrition plan.  She reports struggling with getting some traction.  She is aware that she needs to lose weight and how it is affecting her health but is not in the action stage yet.  She also finds this difficult to do over the holidays.  Orexigenic Control:  Reports problems with appetite and hunger signals.  Reports problems with satiety and satiation.  Denies problems with eating patterns and portion control.  Reports abnormal cravings. Denies feeling deprived or restricted.   Barriers identified: strong hunger signals and impaired satiety / inhibitory control, having difficulty preparing healthy meals, having difficulty with meal prep and planning, having difficulty focusing on healthy eating, exposure to enticing environments and/or relationships, low volume of physical activity at present , difficulty implementing  reduced calorie nutrition plan, difficulty maintaining a reduced calorie state, and lack of family support .   Pharmacotherapy for weight loss: She is currently taking no anti-obesity medication.   Assessment and Plan   Treatment Plan For Obesity:  Recommended Dietary Goals  Alany is currently in the contemplation stage of change. As such, her goal is to continue to implement weight management plan. She has agreed to: continue to work on implementation of reduced calorie nutrition plan (RCNP). Incorporate one meal replacement.  Because of cultural preferences it was very difficult for her to reduce simple carbs and saturated fats so she will focus more on portion control and incorporating a meal replacement once daily.  Behavioral Intervention  We discussed the following Behavioral Modification Strategies today: continue to work on maintaining a reduced calorie state, getting the recommended amount of protein, incorporating whole foods, making healthy choices, staying well hydrated and practicing mindfulness when eating..  Additional resources provided today: None  Recommended Physical Activity Goals  Yumiko has been advised to work up to 150 minutes of moderate intensity aerobic activity a week and strengthening exercises 2-3 times per week for cardiovascular health, weight loss maintenance and preservation of muscle mass.   She has agreed to :  Think about enjoyable ways to increase daily physical activity and overcoming barriers to exercise and Increase physical activity in their day and reduce sedentary time (increase NEAT).  Pharmacotherapy  We discussed various medication options to help Camree with her weight loss efforts and we both agreed to :  Start metformin 500 mg XR  1 tablet twice a day .  Patient does not have insurance so GLP-1 therapy would be cost prohibitive.  Associated Conditions Addressed Today  Abnormal craving Assessment & Plan: Patient has strong cravings for  sweets and may actually have sugar addiction but this may also be partly driven by elevated insulin levels and a diet high in simple carbs.  She will work on increasing protein intake for appetite suppression and reducing simple and added sugars in her diet.  She is also being started on metformin.   Class 2 severe obesity with serious comorbidity and body mass index (BMI) of 35.0 to 35.9 in adult, unspecified obesity type [facility]) Assessment & Plan: See obesity treatment plan   Metabolic dysfunction-associated steatotic liver disease (MASLD) Assessment & Plan: Suggested on recent CT of abdomen in 2024, previous CT in 2023 did not show hepatic steatosis.  She will be screened for glycemic disorders during this visit.  Recent CMP showed normal liver enzymes bilirubin and albumin.  She now has prediabetes and also elevated insulin levels.  Losing 10 to 15% of body weight may improve condition.  We discussed the benefits of reducing saturated fats, simple and added sugars in her diet.  She feels that this will be very difficult due to cultural preferences.   Prediabetes Assessment & Plan: Most recent A1c is  Lab Results  Component Value Date   HGBA1C 5.9 (H) 08/08/2023   HGBA1C 5.6 10/01/2021    Patient aware of disease state and risk of progression. This may contribute to abnormal cravings, fatigue and diabetic complications without having diabetes.   We have discussed treatment options which include: losing 7 to 10% of body weight, increasing physical activity to a goal of 150 minutes a week at moderate intensity.  Advised to maintain a diet low on simple and processed carbohydrates.  Patient received again education on carb insulin model and sources of carbohydrates today.  After discussion of benefits and side effects she will be started on metformin XR 500 mg twice daily.   Orders: -     metFORMIN HCl ER; Take 1 tablet (500 mg total) by mouth 2 (two) times daily with a meal.   Dispense: 60 tablet; Refill: 0  Pure hypercholesterolemia Assessment & Plan: LDL is not at goal. Elevated LDL may be secondary to nutrition, genetics and spillover effect from excess adiposity. Recommended LDL goal is <70 to reduce the risk of fatty streaks and the progression to obstructive ASCVD in the future.   Her 10 year risk is: The 10-year ASCVD risk score (Arnett DK, et al., 2019) is: 1.1%  Lab Results  Component Value Date   CHOL 246 (H) 08/08/2023   HDL 50 08/08/2023   LDLCALC 175 (H) 08/08/2023   TRIG 118 08/08/2023   CHOLHDL 3.8 02/03/2023    Patient had been prescribed simvastatin by PCP but has not been taking medication and she is concerned about taking many medications.  Her cardiovascular risk is slightly above calculated likely due to the presence of obesity, insulin resistance and prediabetes.  We discussed sources of saturated fats and she will work on reducing these this will be difficult culturally though.  I also recommend that she start simvastatin again we will consider switching her to rosuvastatin at a future office visit if she is on board with this.      Has health insurance with inadequate coverage of health expenses Assessment & Plan: Patient will not be able to afford GLP-1 therapy which considering degree of  obesity and comorbid conditions may be of benefit to her.     Objective   Physical Exam:  Blood pressure 110/72, pulse 85, temperature 97.9 F (36.6 C), height 5\' 1"  (1.549 m), weight 187 lb (84.8 kg), last menstrual period 01/11/2019, SpO2 99%. Body mass index is 35.33 kg/m.  General: She is overweight, cooperative, alert, well developed, and in no acute distress. PSYCH: Has normal mood, affect and thought process.   HEENT: EOMI, sclerae are anicteric. Lungs: Normal breathing effort, no conversational dyspnea. Extremities: No edema.  Neurologic: No gross sensory or motor deficits. No tremors or fasciculations noted.    Diagnostic  Data Reviewed:  BMET    Component Value Date/Time   NA 139 08/08/2023 1036   K 4.4 08/08/2023 1036   CL 101 08/08/2023 1036   CO2 24 08/08/2023 1036   GLUCOSE 94 08/08/2023 1036   GLUCOSE 116 (H) 05/25/2023 1224   BUN 12 08/08/2023 1036   CREATININE 0.70 08/08/2023 1036   CREATININE 0.78 12/23/2022 0959   CALCIUM 9.9 08/08/2023 1036   GFRNONAA >60 05/25/2023 1224   GFRNONAA >60 12/23/2022 0959   GFRAA >60 06/03/2020 0949   GFRAA >60 02/26/2020 0915   Lab Results  Component Value Date   HGBA1C 5.9 (H) 08/08/2023   HGBA1C 5.6 10/01/2021   Lab Results  Component Value Date   INSULIN 14.4 08/08/2023   Lab Results  Component Value Date   TSH 3.750 08/08/2023   CBC    Component Value Date/Time   WBC 4.2 08/08/2023 1036   WBC 4.3 05/25/2023 1224   RBC 4.54 08/08/2023 1036   RBC 3.58 (L) 05/25/2023 1224   HGB 13.5 08/08/2023 1036   HCT 41.4 08/08/2023 1036   PLT 193 08/08/2023 1036   MCV 91 08/08/2023 1036   MCH 29.7 08/08/2023 1036   MCH 34.1 (H) 05/25/2023 1224   MCHC 32.6 08/08/2023 1036   MCHC 34.7 05/25/2023 1224   RDW 12.0 08/08/2023 1036   Iron Studies No results found for: "IRON", "TIBC", "FERRITIN", "IRONPCTSAT" Lipid Panel     Component Value Date/Time   CHOL 246 (H) 08/08/2023 1036   TRIG 118 08/08/2023 1036   HDL 50 08/08/2023 1036   CHOLHDL 3.8 02/03/2023 1039   LDLCALC 175 (H) 08/08/2023 1036   Hepatic Function Panel     Component Value Date/Time   PROT 6.8 08/08/2023 1036   ALBUMIN 4.3 08/08/2023 1036   AST 21 08/08/2023 1036   AST 18 12/23/2022 0959   ALT 16 08/08/2023 1036   ALT 13 12/23/2022 0959   ALKPHOS 109 08/08/2023 1036   BILITOT 0.3 08/08/2023 1036   BILITOT 0.5 12/23/2022 0959      Component Value Date/Time   TSH 3.750 08/08/2023 1036   Nutritional Lab Results  Component Value Date   VD25OH 36.6 08/08/2023   VD25OH 24.5 (L) 02/03/2023   VD25OH 32.5 11/04/2022    Follow-Up   Return in about 4 weeks (around  09/19/2023) for For Weight Mangement with Dr. Rikki Spearing - needs 40 minutes, limited english proficency.. She was informed of the importance of frequent follow up visits to maximize her success with intensive lifestyle modifications for her multiple health conditions.  Attestation Statement   Reviewed by clinician on day of visit: allergies, medications, problem list, medical history, surgical history, family history, social history, and previous encounter notes.    I have spent 40 minutes in the care of the patient today including: preparing to see patient (e.g. review and interpretation  of tests, old notes ), obtaining and/or reviewing separately obtained history, performing a medically appropriate examination or evaluation, counseling and educating the patient, ordering medications, test or procedures, documenting clinical information in the electronic or other health care record, and independently interpreting results and communicating results to the patient, family, or caregiver  Worthy Rancher, MD

## 2023-08-22 NOTE — Assessment & Plan Note (Addendum)
LDL is not at goal. Elevated LDL may be secondary to nutrition, genetics and spillover effect from excess adiposity. Recommended LDL goal is <70 to reduce the risk of fatty streaks and the progression to obstructive ASCVD in the future.   Her 10 year risk is: The 10-year ASCVD risk score (Arnett DK, et al., 2019) is: 1.1%  Lab Results  Component Value Date   CHOL 246 (H) 08/08/2023   HDL 50 08/08/2023   LDLCALC 175 (H) 08/08/2023   TRIG 118 08/08/2023   CHOLHDL 3.8 02/03/2023    Patient had been prescribed simvastatin by PCP but has not been taking medication and she is concerned about taking many medications.  Her cardiovascular risk is slightly above calculated likely due to the presence of obesity, insulin resistance and prediabetes.  We discussed sources of saturated fats and she will work on reducing these this will be difficult culturally though.  I also recommend that she start simvastatin again we will consider switching her to rosuvastatin at a future office visit if she is on board with this.

## 2023-08-22 NOTE — Assessment & Plan Note (Signed)
Patient will not be able to afford GLP-1 therapy which considering degree of obesity and comorbid conditions may be of benefit to her.

## 2023-08-22 NOTE — Assessment & Plan Note (Signed)
Patient has strong cravings for sweets and may actually have sugar addiction but this may also be partly driven by elevated insulin levels and a diet high in simple carbs.  She will work on increasing protein intake for appetite suppression and reducing simple and added sugars in her diet.  She is also being started on metformin.

## 2023-08-22 NOTE — Assessment & Plan Note (Signed)
Suggested on recent CT of abdomen in 2024, previous CT in 2023 did not show hepatic steatosis.  She will be screened for glycemic disorders during this visit.  Recent CMP showed normal liver enzymes bilirubin and albumin.  She now has prediabetes and also elevated insulin levels.  Losing 10 to 15% of body weight may improve condition.  We discussed the benefits of reducing saturated fats, simple and added sugars in her diet.  She feels that this will be very difficult due to cultural preferences.

## 2023-08-23 ENCOUNTER — Other Ambulatory Visit: Payer: Self-pay | Admitting: *Deleted

## 2023-08-23 ENCOUNTER — Encounter: Payer: Self-pay | Admitting: Hematology and Oncology

## 2023-08-23 ENCOUNTER — Other Ambulatory Visit: Payer: Self-pay

## 2023-08-24 ENCOUNTER — Other Ambulatory Visit (HOSPITAL_COMMUNITY): Payer: Self-pay

## 2023-08-24 ENCOUNTER — Inpatient Hospital Stay: Payer: No Typology Code available for payment source

## 2023-08-24 ENCOUNTER — Ambulatory Visit: Payer: No Typology Code available for payment source | Admitting: Hematology and Oncology

## 2023-08-25 ENCOUNTER — Ambulatory Visit (HOSPITAL_COMMUNITY)
Admission: RE | Admit: 2023-08-25 | Discharge: 2023-08-25 | Disposition: A | Discharge status: Home / Self Care | Payer: No Typology Code available for payment source | Source: Ambulatory Visit | Attending: Hematology and Oncology | Admitting: Hematology and Oncology

## 2023-08-25 DIAGNOSIS — C50412 Malignant neoplasm of upper-outer quadrant of left female breast: Secondary | ICD-10-CM | POA: Insufficient documentation

## 2023-08-25 DIAGNOSIS — Z170 Estrogen receptor positive status [ER+]: Secondary | ICD-10-CM | POA: Insufficient documentation

## 2023-08-25 MED ORDER — IOHEXOL 300 MG/ML  SOLN
100.0000 mL | Freq: Once | INTRAMUSCULAR | Status: AC | PRN
  Administered 2023-08-25: 100 mL via INTRAVENOUS

## 2023-08-25 NOTE — Progress Notes (Signed)
Patient scheduled as an outpatient. for a CT [facility] Radiology today   Patient with history of allergic reaction to Omnipaque. Reaction hives and SHOB. Patient seen in CT scanner 1 with spanish interpretor Mariel at bedside.   Endorses "scratchy throat" that is improving. Patient verbalized that she took her 3 doses of prednisone and 1 dose of benadryl prior to the scan as instructed. Patietn states that this is her normal reaction with contrast despite pre medication. Denies any SHOB or respiratory issues at this time. Able to speak in full sentences.  Vital signs: HR 105,  O2 - 97 % on room air B/P 146/92  Patient was discharged from Radiology.  Patient instructed to call 911 should his symptoms worsen or any issues with breathing or airway. Patient a verbalized understanding and is in agreement with the plan of care.   IR MD Dr. DD Deanne Coffer made aware and is in agreement with plan of care.

## 2023-08-28 ENCOUNTER — Inpatient Hospital Stay: Payer: No Typology Code available for payment source | Attending: Hematology and Oncology

## 2023-08-28 ENCOUNTER — Inpatient Hospital Stay (HOSPITAL_BASED_OUTPATIENT_CLINIC_OR_DEPARTMENT_OTHER): Payer: No Typology Code available for payment source | Admitting: Hematology and Oncology

## 2023-08-28 VITALS — BP 121/72 | HR 73 | Temp 97.5°F | Resp 18 | Wt 191.5 lb

## 2023-08-28 DIAGNOSIS — M545 Low back pain, unspecified: Secondary | ICD-10-CM | POA: Insufficient documentation

## 2023-08-28 DIAGNOSIS — J45909 Unspecified asthma, uncomplicated: Secondary | ICD-10-CM | POA: Insufficient documentation

## 2023-08-28 DIAGNOSIS — Z8616 Personal history of COVID-19: Secondary | ICD-10-CM | POA: Insufficient documentation

## 2023-08-28 DIAGNOSIS — E538 Deficiency of other specified B group vitamins: Secondary | ICD-10-CM | POA: Insufficient documentation

## 2023-08-28 DIAGNOSIS — Z923 Personal history of irradiation: Secondary | ICD-10-CM | POA: Insufficient documentation

## 2023-08-28 DIAGNOSIS — R071 Chest pain on breathing: Secondary | ICD-10-CM | POA: Insufficient documentation

## 2023-08-28 DIAGNOSIS — G893 Neoplasm related pain (acute) (chronic): Secondary | ICD-10-CM | POA: Insufficient documentation

## 2023-08-28 DIAGNOSIS — R5383 Other fatigue: Secondary | ICD-10-CM | POA: Insufficient documentation

## 2023-08-28 DIAGNOSIS — E782 Mixed hyperlipidemia: Secondary | ICD-10-CM | POA: Insufficient documentation

## 2023-08-28 DIAGNOSIS — K760 Fatty (change of) liver, not elsewhere classified: Secondary | ICD-10-CM | POA: Insufficient documentation

## 2023-08-28 DIAGNOSIS — E559 Vitamin D deficiency, unspecified: Secondary | ICD-10-CM | POA: Insufficient documentation

## 2023-08-28 DIAGNOSIS — C50412 Malignant neoplasm of upper-outer quadrant of left female breast: Secondary | ICD-10-CM | POA: Insufficient documentation

## 2023-08-28 DIAGNOSIS — R0683 Snoring: Secondary | ICD-10-CM | POA: Insufficient documentation

## 2023-08-28 DIAGNOSIS — Z9012 Acquired absence of left breast and nipple: Secondary | ICD-10-CM | POA: Insufficient documentation

## 2023-08-28 DIAGNOSIS — Z95828 Presence of other vascular implants and grafts: Secondary | ICD-10-CM

## 2023-08-28 DIAGNOSIS — Z170 Estrogen receptor positive status [ER+]: Secondary | ICD-10-CM

## 2023-08-28 DIAGNOSIS — Z9221 Personal history of antineoplastic chemotherapy: Secondary | ICD-10-CM | POA: Insufficient documentation

## 2023-08-28 LAB — CBC WITH DIFFERENTIAL/PLATELET
Abs Immature Granulocytes: 0.01 10*3/uL (ref 0.00–0.07)
Basophils Absolute: 0 10*3/uL (ref 0.0–0.1)
Basophils Relative: 0 %
Eosinophils Absolute: 0.1 10*3/uL (ref 0.0–0.5)
Eosinophils Relative: 1 %
HCT: 38.5 % (ref 36.0–46.0)
Hemoglobin: 13.3 g/dL (ref 12.0–15.0)
Immature Granulocytes: 0 %
Lymphocytes Relative: 33 %
Lymphs Abs: 1.5 10*3/uL (ref 0.7–4.0)
MCH: 29.7 pg (ref 26.0–34.0)
MCHC: 34.5 g/dL (ref 30.0–36.0)
MCV: 85.9 fL (ref 80.0–100.0)
Monocytes Absolute: 0.3 10*3/uL (ref 0.1–1.0)
Monocytes Relative: 8 %
Neutro Abs: 2.6 10*3/uL (ref 1.7–7.7)
Neutrophils Relative %: 58 %
Platelets: 193 10*3/uL (ref 150–400)
RBC: 4.48 MIL/uL (ref 3.87–5.11)
RDW: 12.3 % (ref 11.5–15.5)
WBC: 4.5 10*3/uL (ref 4.0–10.5)
nRBC: 0 % (ref 0.0–0.2)

## 2023-08-28 LAB — COMPREHENSIVE METABOLIC PANEL
ALT: 14 U/L (ref 0–44)
AST: 15 U/L (ref 15–41)
Albumin: 4 g/dL (ref 3.5–5.0)
Alkaline Phosphatase: 86 U/L (ref 38–126)
Anion gap: 5 (ref 5–15)
BUN: 12 mg/dL (ref 6–20)
CO2: 29 mmol/L (ref 22–32)
Calcium: 9.5 mg/dL (ref 8.9–10.3)
Chloride: 104 mmol/L (ref 98–111)
Creatinine, Ser: 0.64 mg/dL (ref 0.44–1.00)
GFR, Estimated: >60 mL/min (ref >60)
Glucose, Bld: 105 mg/dL — ABNORMAL HIGH (ref 70–99)
Potassium: 4.1 mmol/L (ref 3.5–5.1)
Sodium: 138 mmol/L (ref 135–145)
Total Bilirubin: 0.4 mg/dL (ref <1.2)
Total Protein: 6.7 g/dL (ref 6.5–8.1)

## 2023-08-28 MED ORDER — SODIUM CHLORIDE 0.9% FLUSH
10.0000 mL | Freq: Once | INTRAVENOUS | Status: AC
  Administered 2023-08-28: 10 mL

## 2023-08-28 MED ORDER — HEPARIN SOD (PORK) LOCK FLUSH 100 UNIT/ML IV SOLN
500.0000 [IU] | Freq: Once | INTRAVENOUS | Status: AC
  Administered 2023-08-28: 500 [IU]

## 2023-08-28 MED ORDER — TAMOXIFEN CITRATE 20 MG PO TABS: 20.0000 mg | ORAL_TABLET | Freq: Every day | ORAL | 1 refills | Status: AC

## 2023-08-28 NOTE — Assessment & Plan Note (Signed)
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  She was most recently seen for ongoing breast redness, given a course of antibiotics but had persistent redness hence we proceeded with a punch biopsy.  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  Pathology was sent to [facility] for second opinion as well. This confirmed triple negative breast cancer. Repeat biopsy demonstrated her HER2 to be 1+.   She progressed on after 2 cycles of TC neoadjuvantly hence we have switched her treatment to Enhertu.   Given locally advanced and unresectable breast cancer and the progression on TC and previous exposure to Adriamycin based regimen, we decided to try Enhertu for HER2 low disease and she had robust response.  She is now status post left mastectomy and axillary resection and there is no evidence of residual tumor, complete pathologic response obtained.  Since this is a recurrent breast cancer and high risk for systemic disease, I have discussed about adjuvant Xeloda based on the create X trial, she is now on adjuvant xeloda.   She has completed 8 cycles of adjuvant Xeloda.  She continues on periodic imaging and signatera testing at this point.  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 Pain described as stabbing, particularly with deep breaths. No evidence of cancer recurrence on recent scans. -Consider physical therapy for chest wall pain.  Lower Back Pain New onset of lower back pain radiating to the legs, started last week. Likely musculoskeletal in nature. -Consider trial of ibuprofen for one week.  Sleepiness and Fatigue Reports of excessive sleepiness and fatigue. No anemia detected on recent blood work. Patient reports snoring, raising the possibility of sleep apnea. -Consider  evaluation for sleep apnea. She wants to wait on this.  Breast Cancer Follow-up No evidence of cancer recurrence on recent scans. Discussed the possibility of introducing tamoxifen for weakly estrogen-positive breast cancer. -Continue regular scans (December and June) and consider introducing blood tests in March and Fall. -Discussed about adverse effects of tamoxifen including but not limited to post menopausal symptoms such as hot flashes, vaginal discharge, risk of DVT/PE, endometrial hyperplasia and bleeding.  She wants to try it and will call us with any adverse effects

## 2023-08-28 NOTE — Progress Notes (Signed)
[facility] Cancer Follow up:    Monica Andrew, NP [address] [place] [address]   DIAGNOSIS:  Cancer Staging  Malignant neoplasm of upper-outer quadrant of left breast in female, estrogen receptor positive (HCC) Staging form: Breast, AJCC 8th Edition - Clinical stage from 01/29/2020: Stage IIIA (cT2, cN1, cM0, G3, ER+, PR-, HER2-) - Signed by Rachel Moulds, MD on 06/28/2022 Stage prefix: Initial diagnosis Histologic grading system: 3 grade system   SUMMARY OF ONCOLOGIC HISTORY: Oncology History  Malignant neoplasm of upper-outer quadrant of left breast in female, estrogen receptor positive (HCC)  01/23/2020 Initial Diagnosis   [place] woman status post left breast upper outer quadrant biopsy 01/23/2020 for a clinical T3 N1, 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             (a) chest CT scan and bone scan 02/07/2020 showed no evidence of metastatic disease   01/29/2020 Cancer Staging   Staging form: Breast, AJCC 8th Edition - Clinical stage from 01/29/2020: Stage IIIA (cT2, cN1, cM0, G3, ER+, PR-, HER2-) - Signed by Rachel Moulds, MD on 06/28/2022 Stage prefix: Initial diagnosis Histologic grading system: 3 grade system   02/07/2020 Genetic Testing   Negative genetic testing. No pathogenic variants identified on the [facility] Common Hereditary Cancers Panel. VUS in MSH6 called c.2744C>G identified. The report date is 02/07/2020.  The Common Hereditary Cancers Panel offered by [facility] includes sequencing and/or deletion duplication testing of the following 48 genes: APC, ATM, AXIN2, BARD1, BMPR1A, BRCA1, BRCA2, BRIP1, CDH1, CDKN2A (p14ARF), CDKN2A (p16INK4a), CKD4, CHEK2, CTNNA1, DICER1, EPCAM (Deletion/duplication testing only), GREM1 (promoter region deletion/duplication testing only), KIT, MEN1, MLH1, MSH2, MSH3, MSH6, MUTYH, NBN, NF1, NHTL1, PALB2, PDGFRA, PMS2, POLD1, POLE, PTEN, RAD50,  RAD51C, RAD51D, RNF43, SDHB, SDHC, SDHD, SMAD4, SMARCA4. STK11, TP53, TSC1, TSC2, and VHL.  The following genes were evaluated for sequence changes only: SDHA and HOXB13 c.251G>A variant only.  UPDATE: MSH6 c.2744C>G (p.Ala915Gly) VUS has been amended to Benign. Amended report date is 02/13/2023.   02/12/2020 - 05/28/2020 Neo-Adjuvant Chemotherapy   neoadjuvant chemotherapy consisting of doxorubicin and cyclophosphamide in dose dense fashion x4 started 02/12/2020, completed 03/25/2020, followed by paclitaxel and carboplatin weekly x12 started 04/08/2020 and completed on 05/28/2020             (a) echo 02/07/2020 shows an ejection fraction in the 55-60% range             (b) Epirubicin substituted for Doxorubicin starting with cycle 2 of AC secondary to allergic rash from Doxorubicin   07/15/2020 Surgery   status post left lumpectomy and sentinel lymph node sampling 07/15/2020 for a ypT1b ypN0 residual invasive ductal carcinoma, grade 3, with negative margins.             (a) a total of 4 left axillary lymph nodes were removed             (b) repeat prognostic panel on the final pathology again triple negative, with an MIB-1 of 50%.   08/26/2020 - 10/09/2020 Radiation Therapy   adjuvant radiation completed 10/09/2020             (a) sensitizing capecitabine attempted but not tolerated by the patient  08/26/2020 through 10/09/2020 Site Technique Total Dose (Gy) Dose per Fx (Gy) Completed Fx Beam Energies  Breast, Left: Breast_Lt 3D 50/50 2 25/25 15X  Breast, Left: Breast_Lt_PAB_SCV 3D 50/50 2 25/25 10X, 15X  Breast, Left:  Breast_Lt_Bst 3D 10/10 2 5/5 6X, 10X     11/23/2020 - 01/04/2021 Adjuvant Chemotherapy   adjuvant pembrolizumab started 11/23/2020, to continue for 1 year             (a) discontinued after 01/04/2021 dose with multiple side effects requiring steroids for resolution   03/30/2022 Mammogram   Mammogram and ultrasound show numerous masses throughout left breast not visualized in  01/2022, +left axillary lymphadenopathy, skin thickening in right breast and in upper abdomen just below sternum   04/05/2022 - 07/20/2022 Chemotherapy   Patient is on Treatment Plan : BREAST METASTATIC Fam-Trastuzumab Deruxtecan-nxki (Enhertu) (5.4) q21d     08/21/2022 Surgery   Left breast mastectomy and lymph node biopsy shows no residual carcinoma.     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  Prognostic panel sent to [facility]: Left breast skin, punch biopsy: Skin with dermal lymphatic involvement by carcinoma (confirmed with outside CK7 IHC, reviewed). Biomarkers are reported as negative for ER, PR, Her2/neu (confirmed on review but please note sample size is extremely small).   02/15/2022 - 03/08/2022 Chemotherapy    Taxotere/Cytoxan x 2, discontinued due to progression of breast.   03/30/2022 Mammogram   Mammogram and ultrasound show numerous masses throughout left breast not visualized in 01/2022, +left axillary lymphadenopathy, skin thickening in right breast and in upper abdomen just below sternum   04/05/2022 - 07/20/2022 Chemotherapy   Patient is on Treatment Plan : BREAST METASTATIC Fam-Trastuzumab Deruxtecan-nxki (Enhertu) (5.4) q21d     11/25/2022 -  Chemotherapy   Patient is on Treatment Plan : BREAST Capecitabine q21d      She completed 8 cycles of adj xeloda.  CURRENT THERAPY: None  INTERVAL HISTORY:  [name] 47 y.o. female returns accompanied by our spanish interpreter today for follow up.  The patient, with a history of breast cancer, presents with multiple complaints. The chief complaint is fatigue and sleepiness, which has been ongoing.  The patient also reports pain in the chest, lower back, and right side. The chest pain is described as a stabbing pain when taking a deep  breath. The lower back pain started a week ago and sometimes radiates down to the legs. The right-sided pain is not well described. The patient also reports snoring, which is a new symptom.  The patient denies any changes in bowel movements but does report occasional constipation. There are no issues with urination. The patient also mentions concern about a recent scan that showed fatty liver and possible scar tissue in the lung from previous radiation treatment.  Rest of the pertinent 10 point ROS reviewed and negative  Patient Active Problem List   Diagnosis Date Noted   Prediabetes 08/22/2023   Abnormal craving 08/22/2023   Pure hypercholesterolemia 08/22/2023   Has health insurance with inadequate coverage of health expenses 08/22/2023   Elevated blood sugar 08/08/2023   Metabolic dysfunction-associated steatotic liver disease (MASLD) 08/08/2023   Class 2 severe obesity with serious comorbidity and body mass index (BMI) of 35.0 to 35.9 in adult [facility]) 08/08/2023   Vitamin B12 deficiency 02/03/2023   Vitamin D deficiency 11/04/2022   Health care maintenance 09/30/2022   S/P left mastectomy 08/22/2022   Burning with urination 06/01/2022   Swelling of left upper extremity 05/18/2022   Breast cancer metastasized  to skin (HCC) 04/01/2022   Encounter for dental examination 03/11/2022   Acute apical periodontitis 03/11/2022   Phobia of dental procedure 03/11/2022   Defective dental restoration 03/11/2022   Accretions on teeth 03/11/2022   Chronic periodontitis 03/11/2022   Teeth missing 03/11/2022   Caries 03/11/2022   Generalized gingival recession 03/11/2022   Pain, dental 03/08/2022   Cancer-related pain 03/08/2022   Port-A-Cath in place 02/15/2022   Oral allergy syndrome, subsequent encounter 02/03/2022   Adverse effect of other drugs, medicaments and biological substances, subsequent encounter 02/02/2022   Other adverse food reactions, not elsewhere classified, subsequent  encounter 02/02/2022   Chest tightness 02/02/2022   Other allergic rhinitis 02/02/2022   Allergic conjunctivitis of both eyes 02/02/2022   Language barrier 12/11/2020   Morbid obesity (HCC)    History of breast cancer    Genetic testing 02/13/2020   Malignant neoplasm of upper-outer quadrant of left breast in female, estrogen receptor positive (HCC) 01/27/2020   Impaired ability to use community resources due to language barrier 07/13/2013    is allergic to omnipaque [iohexol], doxorubicin hcl, keytruda [pembrolizumab], other, tape, and wound dressing adhesive.  MEDICAL HISTORY: Past Medical History:  Diagnosis Date   Allergy    Asthma    Back pain    Breast cancer in female [facility])    Left   Breast disorder    breast cancer May 2021   Chronic back pain    Fatty liver    Headache    High triglycerides    History of breast cancer    History of COVID-19 04/20/2021   Hx of migraines    Morbid obesity (HCC)    Personal history of chemotherapy    Personal history of radiation therapy    SOB (shortness of breath)    Vitamin D deficiency     SURGICAL HISTORY: Past Surgical History:  Procedure Laterality Date   BREAST LUMPECTOMY     BREAST LUMPECTOMY WITH RADIOACTIVE SEED AND SENTINEL LYMPH NODE BIOPSY Left 07/15/2020   Procedure: LEFT BREAST LUMPECTOMY WITH RADIOACTIVE SEED AND LEFT AXILLARY SENTINEL LYMPH NODE BIOPSY, LEFT AXILLARY NODE RADIOACTIVE SEED GUIDED EXCISION, LEFT BREAST RADIOACTIVE SEED GUIDED EXCISION IM NODE;  Surgeon: Emelia Loron, MD;  Location: [facility] OR;  Service: General;  Laterality: Left;  PEC BLOCK   CESAREAN SECTION WITH BILATERAL TUBAL LIGATION Bilateral 07/23/2015   Procedure: CESAREAN SECTION WITH BILATERAL TUBAL LIGATION;  Surgeon: Reva Bores, MD;  Location: [facility]S;  Service: Obstetrics;  Laterality: Bilateral;   IR IMAGING GUIDED PORT INSERTION  02/11/2022   MODIFIED MASTECTOMY Left 08/22/2022   Procedure: LEFT MODIFIED RADICAL MASTECTOMY, LEFT  AXILLARY NODE DISSECTION, AND SKIN BIOPSY OF BACK;  Surgeon: Emelia Loron, MD;  Location: [facility] OR[facility];  Service: General;  Laterality: Left;   PORT-A-CATH REMOVAL N/A 04/30/2021   Procedure: REMOVAL PORT-A-CATH;  Surgeon: Emelia Loron, MD;  Location: [facility];  Service: General;  Laterality: N/A;   PORTACATH PLACEMENT Right 02/11/2020   Procedure: INSERTION PORT-A-CATH WITH ULTRASOUND GUIDANCE;  Surgeon: Emelia Loron, MD;  Location: [facility];  Service: General;  Laterality: Right;   TUBAL LIGATION      SOCIAL HISTORY: Social History   Socioeconomic History   Marital status: Legally Separated    Spouse name: Not on file   Number of children: 3   Years of education: Not on file   Highest education level: 9th grade  Occupational History   Occupation: Holiday representative  Tobacco Use  Smoking status: Never   Smokeless tobacco: Never  Vaping Use   Vaping status: Never Used  Substance and Sexual Activity   Alcohol use: No   Drug use: Never   Sexual activity: Yes    Birth control/protection: Surgical  Other Topics Concern   Not on file  Social History Narrative   Not on file   Social Drivers of Health   Financial Resource Strain: Not on file  Food Insecurity: No Food Insecurity (10/11/2022)   Hunger Vital Sign    Worried About Running Out of Food in the Last Year: Never true    Ran Out of Food in the Last Year: Never true  Transportation Needs: No Transportation Needs (10/11/2022)   PRAPARE - Administrator, Civil Service (Medical): No    Lack of Transportation (Non-Medical): No  Physical Activity: Not on file  Stress: Not on file  Social Connections: Not on file  Intimate Partner Violence: Not At Risk (10/11/2022)   Humiliation, Afraid, Rape, and Kick questionnaire    Fear of Current or Ex-Partner: No    Emotionally Abused: No    Physically Abused: No    Sexually Abused: No    FAMILY HISTORY: Family History  Problem  Relation Age of Onset   Heart disease Maternal Grandmother     Review of Systems  Constitutional:  Positive for fatigue. Negative for appetite change, chills, fever and unexpected weight change.  HENT:   Negative for hearing loss, lump/mass and trouble swallowing.   Eyes:  Positive for eye problems. Negative for icterus.  Respiratory:  Negative for chest tightness, cough and shortness of breath.   Cardiovascular:  Negative for chest pain, leg swelling and palpitations.  Gastrointestinal:  Negative for abdominal distention, abdominal pain, constipation, diarrhea, nausea and vomiting.  Endocrine: Negative for hot flashes.  Genitourinary:  Negative for difficulty urinating.   Musculoskeletal:  Positive for back pain. Negative for arthralgias.  Skin:  Negative for itching and rash.  Neurological:  Negative for dizziness, extremity weakness, headaches and numbness.  Hematological:  Negative for adenopathy. Does not bruise/bleed easily.  Psychiatric/Behavioral:  Negative for depression. The patient is not nervous/anxious.     PHYSICAL EXAMINATION  Vitals:   08/28/23 0941  BP: 121/72  Pulse: 73  Resp: 18  Temp: (!) 97.5 F (36.4 C)  SpO2: 100%    Physical Exam Constitutional:      General: She is not in acute distress.    Appearance: Normal appearance. She is not toxic-appearing.  HENT:     Head: Normocephalic and atraumatic.     Mouth/Throat:     Mouth: Mucous membranes are moist.     Pharynx: Oropharynx is clear. No oropharyngeal exudate or posterior oropharyngeal erythema.  Eyes:     General: No scleral icterus. Cardiovascular:     Rate and Rhythm: Normal rate and regular rhythm.     Pulses: Normal pulses.     Heart sounds: Normal heart sounds.  Pulmonary:     Effort: Pulmonary effort is normal.     Breath sounds: Normal breath sounds.  Chest:     Comments: She is status post left mastectomy, no concern for recurrence.  No regional adenopathy.  No skin changes. Post  mastectomy radiation. Abdominal:     General: Abdomen is flat. Bowel sounds are normal. There is no distension.     Palpations: Abdomen is soft.     Tenderness: There is no abdominal tenderness.  Musculoskeletal:  General: No swelling.     Cervical back: Neck supple.  Lymphadenopathy:     Cervical: No cervical adenopathy.  Skin:    General: Skin is warm and dry.     Findings: No rash.  Neurological:     General: No focal deficit present.     Mental Status: She is alert.  Psychiatric:        Mood and Affect: Mood normal.        Behavior: Behavior normal.     LABORATORY DATA:  CBC    Component Value Date/Time   WBC 4.5 08/28/2023 0909   RBC 4.48 08/28/2023 0909   HGB 13.3 08/28/2023 0909   HGB 13.5 08/08/2023 1036   HCT 38.5 08/28/2023 0909   HCT 41.4 08/08/2023 1036   PLT 193 08/28/2023 0909   PLT 193 08/08/2023 1036   MCV 85.9 08/28/2023 0909   MCV 91 08/08/2023 1036   MCH 29.7 08/28/2023 0909   MCHC 34.5 08/28/2023 0909   RDW 12.3 08/28/2023 0909   RDW 12.0 08/08/2023 1036   LYMPHSABS 1.5 08/28/2023 0909   LYMPHSABS 1.3 08/08/2023 1036   MONOABS 0.3 08/28/2023 0909   EOSABS 0.1 08/28/2023 0909   EOSABS 0.2 08/08/2023 1036   BASOSABS 0.0 08/28/2023 0909   BASOSABS 0.0 08/08/2023 1036    CMP     Component Value Date/Time   NA 138 08/28/2023 0909   NA 139 08/08/2023 1036   K 4.1 08/28/2023 0909   CL 104 08/28/2023 0909   CO2 29 08/28/2023 0909   GLUCOSE 105 (H) 08/28/2023 0909   BUN 12 08/28/2023 0909   BUN 12 08/08/2023 1036   CREATININE 0.64 08/28/2023 0909   CREATININE 0.78 12/23/2022 0959   CALCIUM 9.5 08/28/2023 0909   PROT 6.7 08/28/2023 0909   PROT 6.8 08/08/2023 1036   ALBUMIN 4.0 08/28/2023 0909   ALBUMIN 4.3 08/08/2023 1036   AST 15 08/28/2023 0909   AST 18 12/23/2022 0959   ALT 14 08/28/2023 0909   ALT 13 12/23/2022 0959   ALKPHOS 86 08/28/2023 0909   BILITOT 0.4 08/28/2023 0909   BILITOT 0.3 08/08/2023 1036   BILITOT 0.5  12/23/2022 0959   GFRNONAA >60 08/28/2023 0909   GFRNONAA >60 12/23/2022 0959   GFRAA >60 06/03/2020 0949   GFRAA >60 02/26/2020 0915    ASSESSMENT and THERAPY PLAN:   Malignant neoplasm of upper-outer quadrant of left breast in female, estrogen receptor positive (HCC) [name] is a 47 year old Spanish-speaking woman who has a 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  She was most recently seen for ongoing breast redness, given a course of antibiotics but had persistent redness hence we proceeded with a punch biopsy.  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  Pathology was sent to [facility] for second opinion as well. This confirmed triple negative breast cancer. Repeat biopsy demonstrated her HER2 to be 1+.   She progressed on after 2 cycles of TC neoadjuvantly hence we have switched her treatment to Enhertu.   Given locally advanced and unresectable breast cancer and the progression on TC and previous exposure to Adriamycin based regimen, we decided to try Enhertu for HER2 low disease and she had robust response.  She is now status post left mastectomy and axillary resection and there is no evidence of residual tumor, complete pathologic response obtained.  Since this is a recurrent breast cancer and high risk for  systemic disease, I have discussed about adjuvant Xeloda based on the create X trial, she is now on adjuvant xeloda.   She has completed 8 cycles of adjuvant Xeloda.  She continues on periodic imaging and signatera testing at this point.  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 Pain described as stabbing, particularly with deep breaths. No evidence of cancer recurrence on recent scans. -Consider physical therapy for chest wall pain.  Lower Back Pain New onset of lower back pain radiating to the legs, started  last week. Likely musculoskeletal in nature. -Consider trial of ibuprofen for one week.  Sleepiness and Fatigue Reports of excessive sleepiness and fatigue. No anemia detected on recent blood work. Patient reports snoring, raising the possibility of sleep apnea. -Consider evaluation for sleep apnea. She wants to wait on this.  Breast Cancer Follow-up No evidence of cancer recurrence on recent scans. Discussed the possibility of introducing tamoxifen for weakly estrogen-positive breast cancer. -Continue regular scans (December and June) and consider introducing blood tests in March and Fall. -Discussed about adverse effects of tamoxifen including but not limited to post menopausal symptoms such as hot flashes, vaginal discharge, risk of DVT/PE, endometrial hyperplasia and bleeding.  She wants to try it and will call us with any adverse effects    All questions were answered. The patient knows to call the clinic with any problems, questions or concerns. We can certainly see the patient much sooner if necessary.   Time spent: 40 min  *Total Encounter Time as defined by the Centers for Medicare and Medicaid Services includes, in addition to the face-to-face time of a patient visit (documented in the note above) non-face-to-face time: obtaining and reviewing outside history, ordering and reviewing medications, tests or procedures, care coordination (communications with other health care professionals or caregivers) and documentation in the medical record.

## 2023-08-29 ENCOUNTER — Other Ambulatory Visit: Payer: Self-pay

## 2023-09-09 LAB — SIGNATERA
SIGNATERA MTM READOUT: 0 MTM/ml
SIGNATERA TEST RESULT: NEGATIVE

## 2023-09-27 ENCOUNTER — Other Ambulatory Visit (HOSPITAL_COMMUNITY): Payer: Self-pay

## 2023-09-27 ENCOUNTER — Encounter (INDEPENDENT_AMBULATORY_CARE_PROVIDER_SITE_OTHER): Payer: Self-pay | Admitting: Internal Medicine

## 2023-09-27 ENCOUNTER — Ambulatory Visit (INDEPENDENT_AMBULATORY_CARE_PROVIDER_SITE_OTHER): Payer: No Typology Code available for payment source | Admitting: Internal Medicine

## 2023-09-27 ENCOUNTER — Encounter: Payer: Self-pay | Admitting: Hematology and Oncology

## 2023-09-27 VITALS — BP 105/71 | HR 82 | Temp 97.9°F | Ht 61.0 in | Wt 186.0 lb

## 2023-09-27 DIAGNOSIS — R7303 Prediabetes: Secondary | ICD-10-CM

## 2023-09-27 DIAGNOSIS — E66812 Obesity, class 2: Secondary | ICD-10-CM

## 2023-09-27 DIAGNOSIS — Z6835 Body mass index (BMI) 35.0-35.9, adult: Secondary | ICD-10-CM

## 2023-09-27 DIAGNOSIS — K760 Fatty (change of) liver, not elsewhere classified: Secondary | ICD-10-CM

## 2023-09-27 MED ORDER — METFORMIN HCL ER 500 MG PO TB24
500.0000 mg | ORAL_TABLET | Freq: Two times a day (BID) | ORAL | 1 refills | Status: DC
  Filled 2023-09-27: qty 60, 30d supply, fill #0

## 2023-09-27 MED ORDER — PHENTERMINE HCL 37.5 MG PO TABS
18.7500 mg | ORAL_TABLET | Freq: Every day | ORAL | 0 refills | Status: DC
  Filled 2023-09-27: qty 15, 30d supply, fill #0

## 2023-09-27 NOTE — Progress Notes (Signed)
 Office: [phone]  /  Fax: [phone]  Weight Summary And Biometrics  Vitals Temp: 97.9 F (36.6 C) BP: 105/71 Pulse Rate: 82 SpO2: 98 %   Anthropometric Measurements Height: 5\' 1"  (1.549 m) Weight: 186 lb (84.4 kg) BMI (Calculated): 35.16 Weight at Last Visit: 1 lb Weight Lost Since Last Visit: 187 lb Weight Gained Since Last Visit: 0 lb Starting Weight: 186 lb Total Weight Loss (lbs): 0 lb (0 kg)   Body Composition  Body Fat %: 39.3 % Fat Mass (lbs): 73.4 lbs Muscle Mass (lbs): 107.4 lbs Total Body Water (lbs): 72.6 lbs Visceral Fat Rating : 10    RMR: 1512  Today's Visit #: 3  Starting Date: 08/08/23   Subjective   Chief Complaint: Obesity  [name] is here to discuss her progress with her obesity treatment plan. She is not doing any food plan. She states she is not exercising.  Interval History:   Since last office visit she has lost 1 pounds. She reports has not implemented reduced calorie nutritional plan She has been working on not skipping meals, increasing protein intake at every meal, drinking more water, making healthier choices, reducing portion sizes, and incorporating more whole foods   Last office visit she was started on metformin , denies AE.   Orexigenic Control:  Reports problems with appetite and hunger signals.  Reports problems with satiety and satiation.  Denies problems with eating patterns and portion control.  Reports abnormal cravings. Denies feeling deprived or restricted.   Barriers identified: cost of medication, strong hunger signals and impaired satiety / inhibitory control, having difficulty preparing healthy meals, having difficulty with meal prep and planning, having difficulty focusing on healthy eating, low volume of physical activity at present , difficulty implementing reduced calorie nutrition plan, and difficulty maintaining a reduced calorie state.   Pharmacotherapy for weight loss: She is currently taking  Metformin  (off label use for incretin effect and / or insulin  resistance and / or diabetes prevention) with adequate clinical response  and without side effects..   Assessment and Plan   Treatment Plan For Obesity:  Recommended Dietary Goals  [name] is currently in the action stage of change. As such, her goal is to continue weight management plan. She has agreed to: continue current plan and continue to work on implementation of reduced calorie nutrition plan (RCNP)  Behavioral Intervention  We discussed the following Behavioral Modification Strategies today: increasing lean protein intake to established goals, decreasing simple carbohydrates , increasing vegetables, increasing lower glycemic fruits, increasing fiber rich foods, avoiding skipping meals, and increasing water intake .  Additional resources provided today: None  Recommended Physical Activity Goals  [name] has been advised to work up to 150 minutes of moderate intensity aerobic activity a week and strengthening exercises 2-3 times per week for cardiovascular health, weight loss maintenance and preservation of muscle mass.   She has agreed to :  Think about enjoyable ways to increase daily physical activity and overcoming barriers to exercise and Increase physical activity in their day and reduce sedentary time (increase NEAT).  Pharmacotherapy  We discussed various medication options to help [name] with her weight loss efforts and we both agreed to : start anti-obesity medication.  In addition to reduced calorie nutrition plan (RCNP), behavioral strategies and physical activity, [name] would benefit from pharmacotherapy to assist with hunger signals, satiety and cravings. This will reduce obesity-related health risks by inducing weight loss, and help reduce food consumption and adherence to [facility]) . It may also  improve QOL by improving self-confidence and reduce the  setbacks associated with metabolic adaptations.  After  discussion of treatment options, mechanisms of action, benefits, side effects, contraindications and shared decision making she is agreeable to starting adipex 18.75 mg once a day. Patient also made aware that medication is indicated for long-term management of obesity and the risk of weight regain following discontinuation of treatment and hence the importance of adhering to medical weight loss plan.    Patient signed controlled substance agreement today. Reviewed state registry for controlled subtances and no other controlled substances prescribed.  Associated Conditions Addressed and Impacted by Obesity Treatment  Class 2 severe obesity with serious comorbidity and body mass index (BMI) of 35.0 to 35.9 in adult, unspecified obesity type [facility]) Assessment & Plan: See obesity treatment plan.  Orders: -     Phentermine  HCl; Take 0.5 tablets (18.75 mg total) by mouth daily before breakfast.  Dispense: 15 tablet; Refill: 0  Prediabetes Assessment & Plan: Most recent A1c is  Lab Results  Component Value Date   HGBA1C 5.9 (H) 08/08/2023   HGBA1C 5.6 10/01/2021    Patient aware of disease state and risk of progression. This may contribute to abnormal cravings, fatigue and diabetic complications without having diabetes.   We have discussed treatment options which include: losing 7 to 10% of body weight, increasing physical activity to a goal of 150 minutes a week at moderate intensity.  Advised to maintain a diet low on simple and processed carbohydrates.  Patient received again education on carb insulin  model and sources of carbohydrates today.  She is tolerating metformin . Continue medication. Consider increasing to 2000 mg daily.   Orders: -     metFORMIN  HCl ER; Take 1 tablet (500 mg total) by mouth 2 (two) times daily with a meal.  Dispense: 60 tablet; Refill: 1  Metabolic dysfunction-associated steatotic liver disease (MASLD) Assessment & Plan: Suggested on recent CT of abdomen in  2024, previous CT in 2023 did not show hepatic steatosis.    Recent CMP showed normal liver enzymes bilirubin and albumin.  She now has prediabetes and also elevated insulin  levels.  Losing 10 to 15% of body weight may improve condition.  We discussed the benefits of reducing saturated fats, simple and added sugars in her diet.  She feels that this will be very difficult due to cultural preferences.      Objective   Physical Exam:  Blood pressure 105/71, pulse 82, temperature 97.9 F (36.6 C), height 5\' 1"  (1.549 m), weight 186 lb (84.4 kg), last menstrual period 01/11/2019, SpO2 98%. Body mass index is 35.14 kg/m.  General: She is overweight, cooperative, alert, well developed, and in no acute distress. PSYCH: Has normal mood, affect and thought process.   HEENT: EOMI, sclerae are anicteric. Lungs: Normal breathing effort, no conversational dyspnea. Extremities: No edema.  Neurologic: No gross sensory or motor deficits. No tremors or fasciculations noted.    Diagnostic Data Reviewed:  BMET    Component Value Date/Time   NA 138 08/28/2023 0909   NA 139 08/08/2023 1036   K 4.1 08/28/2023 0909   CL 104 08/28/2023 0909   CO2 29 08/28/2023 0909   GLUCOSE 105 (H) 08/28/2023 0909   BUN 12 08/28/2023 0909   BUN 12 08/08/2023 1036   CREATININE 0.64 08/28/2023 0909   CREATININE 0.78 12/23/2022 0959   CALCIUM  9.5 08/28/2023 0909   GFRNONAA >60 08/28/2023 0909   GFRNONAA >60 12/23/2022 0959   GFRAA >60 06/03/2020 1610  GFRAA >60 02/26/2020 0915   Lab Results  Component Value Date   HGBA1C 5.9 (H) 08/08/2023   HGBA1C 5.6 10/01/2021   Lab Results  Component Value Date   INSULIN  14.4 08/08/2023   Lab Results  Component Value Date   TSH 3.750 08/08/2023   CBC    Component Value Date/Time   WBC 4.5 08/28/2023 0909   RBC 4.48 08/28/2023 0909   HGB 13.3 08/28/2023 0909   HGB 13.5 08/08/2023 1036   HCT 38.5 08/28/2023 0909   HCT 41.4 08/08/2023 1036   PLT 193  08/28/2023 0909   PLT 193 08/08/2023 1036   MCV 85.9 08/28/2023 0909   MCV 91 08/08/2023 1036   MCH 29.7 08/28/2023 0909   MCHC 34.5 08/28/2023 0909   RDW 12.3 08/28/2023 0909   RDW 12.0 08/08/2023 1036   Iron Studies No results found for: "IRON", "TIBC", "FERRITIN", "IRONPCTSAT" Lipid Panel     Component Value Date/Time   CHOL 246 (H) 08/08/2023 1036   TRIG 118 08/08/2023 1036   HDL 50 08/08/2023 1036   CHOLHDL 3.8 02/03/2023 1039   LDLCALC 175 (H) 08/08/2023 1036   Hepatic Function Panel     Component Value Date/Time   PROT 6.7 08/28/2023 0909   PROT 6.8 08/08/2023 1036   ALBUMIN 4.0 08/28/2023 0909   ALBUMIN 4.3 08/08/2023 1036   AST 15 08/28/2023 0909   AST 18 12/23/2022 0959   ALT 14 08/28/2023 0909   ALT 13 12/23/2022 0959   ALKPHOS 86 08/28/2023 0909   BILITOT 0.4 08/28/2023 0909   BILITOT 0.3 08/08/2023 1036   BILITOT 0.5 12/23/2022 0959      Component Value Date/Time   TSH 3.750 08/08/2023 1036   Nutritional Lab Results  Component Value Date   VD25OH 36.6 08/08/2023   VD25OH 24.5 (L) 02/03/2023   VD25OH 32.5 11/04/2022    Follow-Up   Return in about 4 weeks (around 10/25/2023) for For Weight Mangement with Dr. Allie Area.Aaron Aas She was informed of the importance of frequent follow up visits to maximize her success with intensive lifestyle modifications for her multiple health conditions.  Attestation Statement   Reviewed by clinician on day of visit: allergies, medications, problem list, medical history, surgical history, family history, social history, and previous encounter notes.     Ladd Picker, MD

## 2023-09-27 NOTE — Assessment & Plan Note (Signed)
 Most recent A1c is  Lab Results  Component Value Date   HGBA1C 5.9 (H) 08/08/2023   HGBA1C 5.6 10/01/2021    Patient aware of disease state and risk of progression. This may contribute to abnormal cravings, fatigue and diabetic complications without having diabetes.   We have discussed treatment options which include: losing 7 to 10% of body weight, increasing physical activity to a goal of 150 minutes a week at moderate intensity.  Advised to maintain a diet low on simple and processed carbohydrates.  Patient received again education on carb insulin  model and sources of carbohydrates today.  She is tolerating metformin . Continue medication. Consider increasing to 2000 mg daily.

## 2023-09-27 NOTE — Assessment & Plan Note (Signed)
 Suggested on recent CT of abdomen in 2024, previous CT in 2023 did not show hepatic steatosis.    Recent CMP showed normal liver enzymes bilirubin and albumin.  She now has prediabetes and also elevated insulin  levels.  Losing 10 to 15% of body weight may improve condition.  We discussed the benefits of reducing saturated fats, simple and added sugars in her diet.  She feels that this will be very difficult due to cultural preferences.

## 2023-09-27 NOTE — Assessment & Plan Note (Signed)
 See obesity treatment plan

## 2023-10-05 ENCOUNTER — Other Ambulatory Visit: Payer: Self-pay

## 2023-10-09 ENCOUNTER — Inpatient Hospital Stay (HOSPITAL_BASED_OUTPATIENT_CLINIC_OR_DEPARTMENT_OTHER): Payer: No Typology Code available for payment source | Admitting: Hematology and Oncology

## 2023-10-09 ENCOUNTER — Other Ambulatory Visit: Payer: Self-pay

## 2023-10-09 ENCOUNTER — Inpatient Hospital Stay: Payer: No Typology Code available for payment source | Attending: Hematology and Oncology

## 2023-10-09 DIAGNOSIS — R739 Hyperglycemia, unspecified: Secondary | ICD-10-CM | POA: Insufficient documentation

## 2023-10-09 DIAGNOSIS — E782 Mixed hyperlipidemia: Secondary | ICD-10-CM | POA: Insufficient documentation

## 2023-10-09 DIAGNOSIS — Z923 Personal history of irradiation: Secondary | ICD-10-CM | POA: Insufficient documentation

## 2023-10-09 DIAGNOSIS — G893 Neoplasm related pain (acute) (chronic): Secondary | ICD-10-CM | POA: Insufficient documentation

## 2023-10-09 DIAGNOSIS — Z170 Estrogen receptor positive status [ER+]: Secondary | ICD-10-CM | POA: Insufficient documentation

## 2023-10-09 DIAGNOSIS — Z8616 Personal history of COVID-19: Secondary | ICD-10-CM | POA: Insufficient documentation

## 2023-10-09 DIAGNOSIS — C50412 Malignant neoplasm of upper-outer quadrant of left female breast: Secondary | ICD-10-CM | POA: Insufficient documentation

## 2023-10-09 DIAGNOSIS — R519 Headache, unspecified: Secondary | ICD-10-CM | POA: Insufficient documentation

## 2023-10-09 DIAGNOSIS — E559 Vitamin D deficiency, unspecified: Secondary | ICD-10-CM | POA: Insufficient documentation

## 2023-10-09 DIAGNOSIS — Z9221 Personal history of antineoplastic chemotherapy: Secondary | ICD-10-CM | POA: Insufficient documentation

## 2023-10-09 DIAGNOSIS — E538 Deficiency of other specified B group vitamins: Secondary | ICD-10-CM | POA: Insufficient documentation

## 2023-10-09 DIAGNOSIS — R42 Dizziness and giddiness: Secondary | ICD-10-CM | POA: Insufficient documentation

## 2023-10-09 DIAGNOSIS — Z95828 Presence of other vascular implants and grafts: Secondary | ICD-10-CM

## 2023-10-09 MED ORDER — SODIUM CHLORIDE 0.9% FLUSH
10.0000 mL | Freq: Once | INTRAVENOUS | Status: AC
  Administered 2023-10-09: 10 mL

## 2023-10-09 MED ORDER — HEPARIN SOD (PORK) LOCK FLUSH 100 UNIT/ML IV SOLN
500.0000 [IU] | Freq: Once | INTRAVENOUS | Status: AC
  Administered 2023-10-09: 500 [IU]

## 2023-10-10 ENCOUNTER — Other Ambulatory Visit: Payer: Self-pay | Admitting: *Deleted

## 2023-10-10 ENCOUNTER — Encounter: Payer: Self-pay | Admitting: Hematology and Oncology

## 2023-10-10 NOTE — Progress Notes (Signed)
[name] Cancer Follow up:    Monica Andrew, NP [address] [place] [address]   DIAGNOSIS:  Cancer Staging  Malignant neoplasm of upper-outer quadrant of left breast in female, estrogen receptor positive (HCC) Staging form: Breast, AJCC 8th Edition - Clinical stage from 01/29/2020: Stage IIIA (cT2, cN1, cM0, G3, ER+, PR-, HER2-) - Signed by Monica Moulds, MD on 06/28/2022 Stage prefix: Initial diagnosis Histologic grading system: 3 grade system   SUMMARY OF ONCOLOGIC HISTORY: Oncology History  Malignant neoplasm of upper-outer quadrant of left breast in female, estrogen receptor positive (HCC)  01/23/2020 Initial Diagnosis   [name] woman status post left breast upper outer quadrant biopsy 01/23/2020 for a clinical T3 N1, 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             (a) chest CT scan and bone scan 02/07/2020 showed no evidence of metastatic disease   01/29/2020 Cancer Staging   Staging form: Breast, AJCC 8th Edition - Clinical stage from 01/29/2020: Stage IIIA (cT2, cN1, cM0, G3, ER+, PR-, HER2-) - Signed by Monica Moulds, MD on 06/28/2022 Stage prefix: Initial diagnosis Histologic grading system: 3 grade system   02/07/2020 Genetic Testing   Negative genetic testing. No pathogenic variants identified on the [facility] Common Hereditary Cancers Panel. VUS in MSH6 called c.2744C>G identified. The report date is 02/07/2020.  The Common Hereditary Cancers Panel offered by [facility] includes sequencing and/or deletion duplication testing of the following 48 genes: APC, ATM, AXIN2, BARD1, BMPR1A, BRCA1, BRCA2, BRIP1, CDH1, CDKN2A (p14ARF), CDKN2A (p16INK4a), CKD4, CHEK2, CTNNA1, DICER1, EPCAM (Deletion/duplication testing only), GREM1 (promoter region deletion/duplication testing only), KIT, MEN1, MLH1, MSH2, MSH3, MSH6, MUTYH, NBN, NF1, NHTL1, PALB2, PDGFRA, PMS2, POLD1, POLE, PTEN, RAD50,  RAD51C, RAD51D, RNF43, SDHB, SDHC, SDHD, SMAD4, SMARCA4. STK11, TP53, TSC1, TSC2, and VHL.  The following genes were evaluated for sequence changes only: SDHA and HOXB13 c.251G>A variant only.  UPDATE: MSH6 c.2744C>G (p.Ala915Gly) VUS has been amended to Benign. Amended report date is 02/13/2023.   02/12/2020 - 05/28/2020 Neo-Adjuvant Chemotherapy   neoadjuvant chemotherapy consisting of doxorubicin and cyclophosphamide in dose dense fashion x4 started 02/12/2020, completed 03/25/2020, followed by paclitaxel and carboplatin weekly x12 started 04/08/2020 and completed on 05/28/2020             (a) echo 02/07/2020 shows an ejection fraction in the 55-60% range             (b) Epirubicin substituted for Doxorubicin starting with cycle 2 of AC secondary to allergic rash from Doxorubicin   07/15/2020 Surgery   status post left lumpectomy and sentinel lymph node sampling 07/15/2020 for a ypT1b ypN0 residual invasive ductal carcinoma, grade 3, with negative margins.             (a) a total of 4 left axillary lymph nodes were removed             (b) repeat prognostic panel on the final pathology again triple negative, with an MIB-1 of 50%.   08/26/2020 - 10/09/2020 Radiation Therapy   adjuvant radiation completed 10/09/2020             (a) sensitizing capecitabine attempted but not tolerated by the patient  08/26/2020 through 10/09/2020 Site Technique Total Dose (Gy) Dose per Fx (Gy) Completed Fx Beam Energies  Breast, Left: Breast_Lt 3D 50/50 2 25/25 15X  Breast, Left: Breast_Lt_PAB_SCV 3D 50/50 2 25/25 10X, 15X  Breast, Left:  Breast_Lt_Bst 3D 10/10 2 5/5 6X, 10X     11/23/2020 - 01/04/2021 Adjuvant Chemotherapy   adjuvant pembrolizumab started 11/23/2020, to continue for 1 year             (a) discontinued after 01/04/2021 dose with multiple side effects requiring steroids for resolution   03/30/2022 Mammogram   Mammogram and ultrasound show numerous masses throughout left breast not visualized in  01/2022, +left axillary lymphadenopathy, skin thickening in right breast and in upper abdomen just below sternum   04/05/2022 - 07/20/2022 Chemotherapy   Patient is on Treatment Plan : BREAST METASTATIC Fam-Trastuzumab Deruxtecan-nxki (Enhertu) (5.4) q21d     08/21/2022 Surgery   Left breast mastectomy and lymph node biopsy shows no residual carcinoma.     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  Prognostic panel sent to [facility]: Left breast skin, punch biopsy: Skin with dermal lymphatic involvement by carcinoma (confirmed with outside CK7 IHC, reviewed). Biomarkers are reported as negative for ER, PR, Her2/neu (confirmed on review but please note sample size is extremely small).   02/15/2022 - 03/08/2022 Chemotherapy    Taxotere/Cytoxan x 2, discontinued due to progression of breast.   03/30/2022 Mammogram   Mammogram and ultrasound show numerous masses throughout left breast not visualized in 01/2022, +left axillary lymphadenopathy, skin thickening in right breast and in upper abdomen just below sternum   04/05/2022 - 07/20/2022 Chemotherapy   Patient is on Treatment Plan : BREAST METASTATIC Fam-Trastuzumab Deruxtecan-nxki (Enhertu) (5.4) q21d     11/25/2022 -  Chemotherapy   Patient is on Treatment Plan : BREAST Capecitabine q21d      She completed 8 cycles of adj xeloda.  CURRENT THERAPY: None  INTERVAL HISTORY:  [name] 48 y.o. female returns accompanied by our spanish interpreter today for follow up.  She came for a walk-in appointment today with new onset redness once again on the posterior chest wall which has been ongoing for 3 weeks.  She apparently waited to see if this will get better.  She also complains of vertigo-like feeling especially when she wakes up every morning out of bed and  some headaches.  She is very worried about this chest redness because this is how her breast cancer recurred last time.  No other complaints today.  She most recently had Signatera which did not detect any complementary tumor DNA.  Her most recent scans in December did not show any evidence of metastatic disease. Rest of the pertinent 10 point ROS reviewed and negative  Patient Active Problem List   Diagnosis Date Noted   Prediabetes 08/22/2023   Abnormal craving 08/22/2023   Pure hypercholesterolemia 08/22/2023   Has health insurance with inadequate coverage of health expenses 08/22/2023   Elevated blood sugar 08/08/2023   Metabolic dysfunction-associated steatotic liver disease (MASLD) 08/08/2023   Class 2 severe obesity with serious comorbidity and body mass index (BMI) of 35.0 to 35.9 in adult [facility]) 08/08/2023   Vitamin B12 deficiency 02/03/2023   Vitamin D deficiency 11/04/2022   Health care maintenance 09/30/2022   S/P left mastectomy 08/22/2022   Burning with urination 06/01/2022   Swelling of left upper extremity 05/18/2022   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) 04/01/2022   Encounter for dental examination 03/11/2022   Acute apical periodontitis 03/11/2022  Phobia of dental procedure 03/11/2022   Defective dental restoration 03/11/2022   Accretions on teeth 03/11/2022   Chronic periodontitis 03/11/2022   Teeth missing 03/11/2022   Caries 03/11/2022   Generalized gingival recession 03/11/2022   Pain, dental 03/08/2022   Cancer-related pain 03/08/2022   Port-A-Cath in place 02/15/2022   Oral allergy syndrome, subsequent encounter 02/03/2022   Adverse effect of other drugs, medicaments and biological substances, subsequent encounter 02/02/2022   Other adverse food reactions, not elsewhere classified, subsequent encounter 02/02/2022   Chest tightness 02/02/2022   Other allergic rhinitis 02/02/2022   Allergic conjunctivitis of both eyes 02/02/2022   Language barrier  12/11/2020   Morbid obesity (HCC)    History of breast cancer    Genetic testing 02/13/2020   Malignant neoplasm of upper-outer quadrant of left breast in female, estrogen receptor positive (HCC) 01/27/2020   Impaired ability to use community resources due to language barrier 07/13/2013    is allergic to omnipaque [iohexol], doxorubicin hcl, keytruda [pembrolizumab], other, tape, and wound dressing adhesive.  MEDICAL HISTORY: Past Medical History:  Diagnosis Date   Allergy    Asthma    Back pain    Breast cancer in female [facility])    Left   Breast disorder    breast cancer May 2021   Chronic back pain    Fatty liver    Headache    High triglycerides    History of breast cancer    History of COVID-19 04/20/2021   Hx of migraines    Morbid obesity (HCC)    Personal history of chemotherapy    Personal history of radiation therapy    SOB (shortness of breath)    Vitamin D deficiency     SURGICAL HISTORY: Past Surgical History:  Procedure Laterality Date   BREAST LUMPECTOMY     BREAST LUMPECTOMY WITH RADIOACTIVE SEED AND SENTINEL LYMPH NODE BIOPSY Left 07/15/2020   Procedure: LEFT BREAST LUMPECTOMY WITH RADIOACTIVE SEED AND LEFT AXILLARY SENTINEL LYMPH NODE BIOPSY, LEFT AXILLARY NODE RADIOACTIVE SEED GUIDED EXCISION, LEFT BREAST RADIOACTIVE SEED GUIDED EXCISION IM NODE;  Surgeon: Emelia Loron, MD;  Location: [facility] OR;  Service: General;  Laterality: Left;  PEC BLOCK   CESAREAN SECTION WITH BILATERAL TUBAL LIGATION Bilateral 07/23/2015   Procedure: CESAREAN SECTION WITH BILATERAL TUBAL LIGATION;  Surgeon: Reva Bores, MD;  Location: [facility];  Service: Obstetrics;  Laterality: Bilateral;   IR IMAGING GUIDED PORT INSERTION  02/11/2022   MODIFIED MASTECTOMY Left 08/22/2022   Procedure: LEFT MODIFIED RADICAL MASTECTOMY, LEFT AXILLARY NODE DISSECTION, AND SKIN BIOPSY OF BACK;  Surgeon: Emelia Loron, MD;  Location: [facility];  Service: General;  Laterality: Left;   PORT-A-CATH  REMOVAL N/A 04/30/2021   Procedure: REMOVAL PORT-A-CATH;  Surgeon: Emelia Loron, MD;  Location: [facility];  Service: General;  Laterality: N/A;   PORTACATH PLACEMENT Right 02/11/2020   Procedure: INSERTION PORT-A-CATH WITH ULTRASOUND GUIDANCE;  Surgeon: Emelia Loron, MD;  Location: [facility];  Service: General;  Laterality: Right;   TUBAL LIGATION      SOCIAL HISTORY: Social History   Socioeconomic History   Marital status: Legally Separated    Spouse name: Not on file   Number of children: 3   Years of education: Not on file   Highest education level: 9th grade  Occupational History   Occupation: Holiday representative  Tobacco Use   Smoking status: Never   Smokeless tobacco: Never  Vaping Use   Vaping status: Never Used  Substance  and Sexual Activity   Alcohol use: No   Drug use: Never   Sexual activity: Yes    Birth control/protection: Surgical  Other Topics Concern   Not on file  Social History Narrative   Not on file   Social Drivers of Health   Financial Resource Strain: Not on file  Food Insecurity: No Food Insecurity (10/11/2022)   Hunger Vital Sign    Worried About Running Out of Food in the Last Year: Never true    Ran Out of Food in the Last Year: Never true  Transportation Needs: No Transportation Needs (10/11/2022)   PRAPARE - Administrator, Civil Service (Medical): No    Lack of Transportation (Non-Medical): No  Physical Activity: Not on file  Stress: Not on file  Social Connections: Not on file  Intimate Partner Violence: Not At Risk (10/11/2022)   Humiliation, Afraid, Rape, and Kick questionnaire    Fear of Current or Ex-Partner: No    Emotionally Abused: No    Physically Abused: No    Sexually Abused: No    FAMILY HISTORY: Family History  Problem Relation Age of Onset   Heart disease Maternal Grandmother     Review of Systems  Constitutional:  Positive for fatigue. Negative for appetite change,  chills, fever and unexpected weight change.  HENT:   Negative for hearing loss, lump/mass and trouble swallowing.   Eyes:  Positive for eye problems. Negative for icterus.  Respiratory:  Negative for chest tightness, cough and shortness of breath.   Cardiovascular:  Negative for chest pain, leg swelling and palpitations.  Gastrointestinal:  Negative for abdominal distention, abdominal pain, constipation, diarrhea, nausea and vomiting.  Endocrine: Negative for hot flashes.  Genitourinary:  Negative for difficulty urinating.   Musculoskeletal:  Positive for back pain. Negative for arthralgias.  Skin:  Negative for itching and rash.  Neurological:  Negative for dizziness, extremity weakness, headaches and numbness.  Hematological:  Negative for adenopathy. Does not bruise/bleed easily.  Psychiatric/Behavioral:  Negative for depression. The patient is not nervous/anxious.     PHYSICAL EXAMINATION   Chest wall redness on the posterior left chest wall, pictures in media patchy, extends to the axilla on the left side.  No palpable lumps.  No regional adenopathy palpable.  No chest wall erythema noted on the right side.  LABORATORY DATA:  CBC    Component Value Date/Time   WBC 4.5 08/28/2023 0909   RBC 4.48 08/28/2023 0909   HGB 13.3 08/28/2023 0909   HGB 13.5 08/08/2023 1036   HCT 38.5 08/28/2023 0909   HCT 41.4 08/08/2023 1036   PLT 193 08/28/2023 0909   PLT 193 08/08/2023 1036   MCV 85.9 08/28/2023 0909   MCV 91 08/08/2023 1036   MCH 29.7 08/28/2023 0909   MCHC 34.5 08/28/2023 0909   RDW 12.3 08/28/2023 0909   RDW 12.0 08/08/2023 1036   LYMPHSABS 1.5 08/28/2023 0909   LYMPHSABS 1.3 08/08/2023 1036   MONOABS 0.3 08/28/2023 0909   EOSABS 0.1 08/28/2023 0909   EOSABS 0.2 08/08/2023 1036   BASOSABS 0.0 08/28/2023 0909   BASOSABS 0.0 08/08/2023 1036    CMP     Component Value Date/Time   NA 138 08/28/2023 0909   NA 139 08/08/2023 1036   K 4.1 08/28/2023 0909   CL 104  08/28/2023 0909   CO2 29 08/28/2023 0909   GLUCOSE 105 (H) 08/28/2023 0909   BUN 12 08/28/2023 0909   BUN 12 08/08/2023 1036  CREATININE 0.64 08/28/2023 0909   CREATININE 0.78 12/23/2022 0959   CALCIUM 9.5 08/28/2023 0909   PROT 6.7 08/28/2023 0909   PROT 6.8 08/08/2023 1036   ALBUMIN 4.0 08/28/2023 0909   ALBUMIN 4.3 08/08/2023 1036   AST 15 08/28/2023 0909   AST 18 12/23/2022 0959   ALT 14 08/28/2023 0909   ALT 13 12/23/2022 0959   ALKPHOS 86 08/28/2023 0909   BILITOT 0.4 08/28/2023 0909   BILITOT 0.3 08/08/2023 1036   BILITOT 0.5 12/23/2022 0959   GFRNONAA >60 08/28/2023 0909   GFRNONAA >60 12/23/2022 0959   GFRAA >60 06/03/2020 0949   GFRAA >60 02/26/2020 0915    ASSESSMENT and THERAPY PLAN:   Malignant neoplasm of upper-outer quadrant of left breast in female, estrogen receptor positive (HCC) [name] is a 48 year old Spanish-speaking woman who has a 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  She was most recently seen for ongoing breast redness, given a course of antibiotics but had persistent redness hence we proceeded with a punch biopsy.  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  Pathology was sent to [facility] for second opinion as well. This confirmed triple negative breast cancer. Repeat biopsy demonstrated her HER2 to be 1+.   She progressed on after 2 cycles of TC neoadjuvantly hence we have switched her treatment to Enhertu.   Given locally advanced and unresectable breast cancer and the progression on TC and previous exposure to Adriamycin based regimen, we decided to try Enhertu for HER2 low disease and she had robust response.  She is now status post left mastectomy and axillary resection and there is no evidence of residual tumor, complete pathologic response obtained.  Since this is a recurrent breast cancer and high risk for systemic disease, I have  discussed about adjuvant Xeloda based on the create X trial, she is now on adjuvant xeloda.   She has completed 8 cycles of adjuvant Xeloda.  She continues on periodic imaging and signatera testing at this point.  She comes in for an interim visit with chest wall redness and is worried about cancer recurrence.  Her most recent Signatera testing was negative, most recent imaging without any evidence of progression.  No palpable masses or regional adenopathy. We will attempt an ultrasound chest wall and discuss about possible need for another biopsy with Dr. Dwain Sarna. With regards to the vertigo, this seems to be very positional at this time hence we have discussed about monitoring it.  In the past when we imaged her brain, we did not find any evidence of metastatic disease.    All questions were answered. The patient knows to call the clinic with any problems, questions or concerns. We can certainly see the patient much sooner if necessary.   Time spent: 20 min  *Total Encounter Time as defined by the Centers for Medicare and Medicaid Services includes, in addition to the face-to-face time of a patient visit (documented in the note above) non-face-to-face time: obtaining and reviewing outside history, ordering and reviewing medications, tests or procedures, care coordination (communications with other health care professionals or caregivers) and documentation in the medical record.

## 2023-10-10 NOTE — Assessment & Plan Note (Signed)
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  She was most recently seen for ongoing breast redness, given a course of antibiotics but had persistent redness hence we proceeded with a punch biopsy.  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  Pathology was sent to [facility] for second opinion as well. This confirmed triple negative breast cancer. Repeat biopsy demonstrated her HER2 to be 1+.   She progressed on after 2 cycles of TC neoadjuvantly hence we have switched her treatment to Enhertu.   Given locally advanced and unresectable breast cancer and the progression on TC and previous exposure to Adriamycin based regimen, we decided to try Enhertu for HER2 low disease and she had robust response.  She is now status post left mastectomy and axillary resection and there is no evidence of residual tumor, complete pathologic response obtained.  Since this is a recurrent breast cancer and high risk for systemic disease, I have discussed about adjuvant Xeloda based on the create X trial, she is now on adjuvant xeloda.   She has completed 8 cycles of adjuvant Xeloda.  She continues on periodic imaging and signatera testing at this point.  She comes in for an interim visit with chest wall redness and is worried about cancer recurrence.  Her most recent Signatera testing was negative, most recent imaging without any evidence of progression.  No palpable masses or regional adenopathy. We will attempt an ultrasound chest wall and discuss about possible need for another biopsy with Dr. Dwain Sarna. With regards to the vertigo, this seems to be very positional at this time hence we have discussed about monitoring it.  In the past when we imaged her brain, we did not find any evidence of metastatic disease.

## 2023-10-11 ENCOUNTER — Other Ambulatory Visit: Payer: Self-pay | Admitting: *Deleted

## 2023-10-11 ENCOUNTER — Telehealth: Payer: Self-pay | Admitting: *Deleted

## 2023-10-11 DIAGNOSIS — M546 Pain in thoracic spine: Secondary | ICD-10-CM

## 2023-10-11 DIAGNOSIS — C50919 Malignant neoplasm of unspecified site of unspecified female breast: Secondary | ICD-10-CM

## 2023-10-11 DIAGNOSIS — C50912 Malignant neoplasm of unspecified site of left female breast: Secondary | ICD-10-CM

## 2023-10-11 DIAGNOSIS — Z170 Estrogen receptor positive status [ER+]: Secondary | ICD-10-CM

## 2023-10-11 NOTE — Telephone Encounter (Signed)
This RN entered order for left breast US with request to include posterior chest wall due to noted skin changes in this area.  Pt has history of left breast cancer - then with recurrent mets to skin of left breast with pathology of inflammatory breast cancer.  This RN will [facility] with the [facility] for the above reqeuest

## 2023-10-17 ENCOUNTER — Other Ambulatory Visit: Payer: Self-pay | Admitting: *Deleted

## 2023-10-17 DIAGNOSIS — Z08 Encounter for follow-up examination after completed treatment for malignant neoplasm: Secondary | ICD-10-CM

## 2023-10-30 ENCOUNTER — Encounter (INDEPENDENT_AMBULATORY_CARE_PROVIDER_SITE_OTHER): Payer: Self-pay | Admitting: Internal Medicine

## 2023-10-30 ENCOUNTER — Other Ambulatory Visit (HOSPITAL_COMMUNITY): Payer: Self-pay

## 2023-10-30 ENCOUNTER — Encounter: Payer: Self-pay | Admitting: Hematology and Oncology

## 2023-10-30 ENCOUNTER — Ambulatory Visit (INDEPENDENT_AMBULATORY_CARE_PROVIDER_SITE_OTHER): Payer: No Typology Code available for payment source | Admitting: Internal Medicine

## 2023-10-30 VITALS — BP 100/57 | HR 81 | Temp 98.0°F | Ht 61.0 in | Wt 186.0 lb

## 2023-10-30 DIAGNOSIS — Z6835 Body mass index (BMI) 35.0-35.9, adult: Secondary | ICD-10-CM

## 2023-10-30 DIAGNOSIS — K760 Fatty (change of) liver, not elsewhere classified: Secondary | ICD-10-CM

## 2023-10-30 DIAGNOSIS — R7303 Prediabetes: Secondary | ICD-10-CM

## 2023-10-30 DIAGNOSIS — E66812 Obesity, class 2: Secondary | ICD-10-CM

## 2023-10-30 MED ORDER — METFORMIN HCL ER 500 MG PO TB24
500.0000 mg | ORAL_TABLET | Freq: Two times a day (BID) | ORAL | 1 refills | Status: AC
  Filled 2023-10-30: qty 60, 30d supply, fill #0

## 2023-10-30 MED ORDER — PHENTERMINE HCL 37.5 MG PO TABS
37.5000 mg | ORAL_TABLET | Freq: Every day | ORAL | 0 refills | Status: AC
  Filled 2023-10-30: qty 30, 30d supply, fill #0

## 2023-10-30 NOTE — Assessment & Plan Note (Addendum)
Patient having a difficult time with carb cravings.  We consult her on the energy balance model of obesity and also the carb insulin model again today.  She has a predilection for simple and processed carbs which will make weight loss very difficult. - Work on de stressing - Reduce sweet and processed carbs - Increase protein to 30 to 40 g per meal - Try to sleep 7-8 hours - Increase PA -Increase phentermine to 37.5 mg daily

## 2023-10-30 NOTE — Progress Notes (Signed)
Office: [phone]  /  Fax: [phone]  Weight Summary And Biometrics  Vitals Temp: 98 F (36.7 C) BP: (!) 100/57 Pulse Rate: 81 SpO2: 100 %   Anthropometric Measurements Height: 5\' 1"  (1.549 m) Weight: 186 lb (84.4 kg) BMI (Calculated): 35.16 Weight at Last Visit: 186 lb Weight Lost Since Last Visit: 0 Weight Gained Since Last Visit: 0 Starting Weight: 186 lb Total Weight Loss (lbs): 0 lb (0 kg)   Body Composition  Body Fat %: 39.2 % Fat Mass (lbs): 73 lbs Muscle Mass (lbs): 107.6 lbs Total Body Water (lbs): 72.8 lbs Visceral Fat Rating : 10    No data recorded Today's Visit #: 4  Starting Date: 08/08/23   Subjective   Chief Complaint: Obesity  [name] is here to discuss her progress with her obesity treatment plan. She is not sure but thinks she is on the keeping a food journal and adhering to recommended goals of 1200 calories and not sure of protein and states she is following her eating plan approximately 60 % of the time. She states she is currently not exercising.  Interval History:   Since last office visit she has maintained weight. She reports fair adherence to reduced calorie nutritional plan. She has been working on not skipping meals, increasing protein intake at every meal, drinking more water, avoiding and / or reducing liquid calories, and avoiding or reducing simple and processed carbohydrates Has sweets at home.   Orexigenic Control:  Reports problems with appetite and hunger signals.  Reports problems with satiety and satiation.  Denies problems with eating patterns and portion control.  Reports abnormal cravings. Denies feeling deprived or restricted.   Barriers identified: low volume of physical activity at present , inadequate sleep, and moderate to high levels of stress.   Pharmacotherapy for weight loss: She is currently taking Metformin (off label use for incretin effect and / or insulin resistance and / or diabetes  prevention) with adequate clinical response  and without side effects. and Phentermine (longterm use, single agent)  with adequate clinical response  and without side effects..   Assessment and Plan   Treatment Plan For Obesity:  Recommended Dietary Goals  Nicolasa is currently in the action stage of change. As such, her goal is to continue weight management plan. She has agreed to: continue current plan  Behavioral Intervention  We discussed the following Behavioral Modification Strategies today: increasing lean protein intake to established goals, decreasing simple carbohydrates , increasing vegetables, increasing lower glycemic fruits, increasing fiber rich foods, increasing water intake , work on meal planning and preparation, work on tracking and journaling calories using tracking application, and work on managing stress, creating time for self-care and relaxation.  Additional resources provided today: None  Recommended Physical Activity Goals  Mayanna has been advised to work up to 150 minutes of moderate intensity aerobic activity a week and strengthening exercises 2-3 times per week for cardiovascular health, weight loss maintenance and preservation of muscle mass.   She has agreed to :  Think about enjoyable ways to increase daily physical activity and overcoming barriers to exercise and Increase physical activity in their day and reduce sedentary time (increase NEAT).  Pharmacotherapy  We discussed various medication options to help Chanie with her weight loss efforts and we both agreed to :  Continue metformin at current dose.  Increase phentermine to 37.5 mg daily  Associated Conditions Addressed Today  Metabolic dysfunction-associated steatotic liver disease (MASLD) Assessment & Plan: Stable.  Continue to  work on reducing saturated fats and simple and processed carbs in diet.  Losing 15% of body weight may improve condition.   Class 2 severe obesity with serious comorbidity  and body mass index (BMI) of 35.0 to 35.9 in adult, unspecified obesity type [facility]) Assessment & Plan: Patient having a difficult time with carb cravings.  We consult her on the energy balance model of obesity and also the carb insulin model again today.  She has a predilection for simple and processed carbs which will make weight loss very difficult. - Work on de stressing - Reduce sweet and processed carbs - Increase protein to 30 to 40 g per meal - Try to sleep 7-8 hours - Increase PA -Increase phentermine to 37.5 mg daily  Orders: -     Phentermine HCl; Take 1 tablet (37.5 mg total) by mouth daily before breakfast.  Dispense: 30 tablet; Refill: 0  Prediabetes Assessment & Plan: Stable.  On metformin XR 500 mg twice a day without any adverse effects.  Continue medication.  Again educated on the carb insulin model and counseled on the importance of maintaining a diet with a low glycemic load.  Orders: -     metFORMIN HCl ER; Take 1 tablet (500 mg total) by mouth 2 (two) times daily with a meal.  Dispense: 60 tablet; Refill: 1    Objective   Physical Exam:  Blood pressure (!) 100/57, pulse 81, temperature 98 F (36.7 C), height 5\' 1"  (1.549 m), weight 186 lb (84.4 kg), last menstrual period 01/11/2019, SpO2 100%. Body mass index is 35.14 kg/m.  General: She is overweight, cooperative, alert, well developed, and in no acute distress. PSYCH: Has normal mood, affect and thought process.   HEENT: EOMI, sclerae are anicteric. Lungs: Normal breathing effort, no conversational dyspnea. Extremities: No edema.  Neurologic: No gross sensory or motor deficits. No tremors or fasciculations noted.    Diagnostic Data Reviewed:  BMET    Component Value Date/Time   NA 138 08/28/2023 0909   NA 139 08/08/2023 1036   K 4.1 08/28/2023 0909   CL 104 08/28/2023 0909   CO2 29 08/28/2023 0909   GLUCOSE 105 (H) 08/28/2023 0909   BUN 12 08/28/2023 0909   BUN 12 08/08/2023 1036   CREATININE  0.64 08/28/2023 0909   CREATININE 0.78 12/23/2022 0959   CALCIUM 9.5 08/28/2023 0909   GFRNONAA >60 08/28/2023 0909   GFRNONAA >60 12/23/2022 0959   GFRAA >60 06/03/2020 0949   GFRAA >60 02/26/2020 0915   Lab Results  Component Value Date   HGBA1C 5.9 (H) 08/08/2023   HGBA1C 5.6 10/01/2021   Lab Results  Component Value Date   INSULIN 14.4 08/08/2023   Lab Results  Component Value Date   TSH 3.750 08/08/2023   CBC    Component Value Date/Time   WBC 4.5 08/28/2023 0909   RBC 4.48 08/28/2023 0909   HGB 13.3 08/28/2023 0909   HGB 13.5 08/08/2023 1036   HCT 38.5 08/28/2023 0909   HCT 41.4 08/08/2023 1036   PLT 193 08/28/2023 0909   PLT 193 08/08/2023 1036   MCV 85.9 08/28/2023 0909   MCV 91 08/08/2023 1036   MCH 29.7 08/28/2023 0909   MCHC 34.5 08/28/2023 0909   RDW 12.3 08/28/2023 0909   RDW 12.0 08/08/2023 1036   Iron Studies No results found for: "IRON", "TIBC", "FERRITIN", "IRONPCTSAT" Lipid Panel     Component Value Date/Time   CHOL 246 (H) 08/08/2023 1036   TRIG 118  08/08/2023 1036   HDL 50 08/08/2023 1036   CHOLHDL 3.8 02/03/2023 1039   LDLCALC 175 (H) 08/08/2023 1036   Hepatic Function Panel     Component Value Date/Time   PROT 6.7 08/28/2023 0909   PROT 6.8 08/08/2023 1036   ALBUMIN 4.0 08/28/2023 0909   ALBUMIN 4.3 08/08/2023 1036   AST 15 08/28/2023 0909   AST 18 12/23/2022 0959   ALT 14 08/28/2023 0909   ALT 13 12/23/2022 0959   ALKPHOS 86 08/28/2023 0909   BILITOT 0.4 08/28/2023 0909   BILITOT 0.3 08/08/2023 1036   BILITOT 0.5 12/23/2022 0959      Component Value Date/Time   TSH 3.750 08/08/2023 1036   Nutritional Lab Results  Component Value Date   VD25OH 36.6 08/08/2023   VD25OH 24.5 (L) 02/03/2023   VD25OH 32.5 11/04/2022    Follow-Up   Return in about 4 weeks (around 11/27/2023) for For Weight Mangement with Dr. Rikki Spearing.Marland Kitchen She was informed of the importance of frequent follow up visits to maximize her success with intensive  lifestyle modifications for her multiple health conditions.  Attestation Statement   Reviewed by clinician on day of visit: allergies, medications, problem list, medical history, surgical history, family history, social history, and previous encounter notes.     Worthy Rancher, MD

## 2023-10-30 NOTE — Assessment & Plan Note (Signed)
Stable.  On metformin XR 500 mg twice a day without any adverse effects.  Continue medication.  Again educated on the carb insulin model and counseled on the importance of maintaining a diet with a low glycemic load.

## 2023-10-30 NOTE — Assessment & Plan Note (Signed)
Stable.  Continue to work on reducing saturated fats and simple and processed carbs in diet.  Losing 15% of body weight may improve condition.

## 2023-11-02 ENCOUNTER — Ambulatory Visit: Payer: No Typology Code available for payment source

## 2023-11-20 ENCOUNTER — Telehealth: Payer: Self-pay | Admitting: Hematology and Oncology

## 2023-11-20 ENCOUNTER — Inpatient Hospital Stay: Payer: Self-pay | Attending: Hematology and Oncology

## 2023-11-20 DIAGNOSIS — Z452 Encounter for adjustment and management of vascular access device: Secondary | ICD-10-CM | POA: Insufficient documentation

## 2023-11-20 DIAGNOSIS — C50412 Malignant neoplasm of upper-outer quadrant of left female breast: Secondary | ICD-10-CM | POA: Insufficient documentation

## 2023-11-21 ENCOUNTER — Ambulatory Visit
Admission: RE | Admit: 2023-11-21 | Discharge: 2023-11-21 | Disposition: A | Discharge status: Home / Self Care | Source: Ambulatory Visit | Attending: Hematology and Oncology

## 2023-11-21 ENCOUNTER — Ambulatory Visit: Payer: Self-pay | Admitting: *Deleted

## 2023-11-21 ENCOUNTER — Ambulatory Visit
Admission: RE | Admit: 2023-11-21 | Discharge: 2023-11-21 | Disposition: A | Discharge status: Home / Self Care | Payer: No Typology Code available for payment source | Source: Ambulatory Visit | Attending: Obstetrics and Gynecology | Admitting: Obstetrics and Gynecology

## 2023-11-21 ENCOUNTER — Encounter: Payer: Self-pay | Admitting: Hematology and Oncology

## 2023-11-21 ENCOUNTER — Other Ambulatory Visit: Payer: Self-pay | Admitting: Obstetrics and Gynecology

## 2023-11-21 VITALS — BP 125/68 | Ht 61.0 in | Wt 190.0 lb

## 2023-11-21 DIAGNOSIS — Z1211 Encounter for screening for malignant neoplasm of colon: Secondary | ICD-10-CM

## 2023-11-21 DIAGNOSIS — C50412 Malignant neoplasm of upper-outer quadrant of left female breast: Secondary | ICD-10-CM

## 2023-11-21 DIAGNOSIS — Z08 Encounter for follow-up examination after completed treatment for malignant neoplasm: Secondary | ICD-10-CM

## 2023-11-21 DIAGNOSIS — Z1239 Encounter for other screening for malignant neoplasm of breast: Secondary | ICD-10-CM

## 2023-11-21 DIAGNOSIS — M546 Pain in thoracic spine: Secondary | ICD-10-CM

## 2023-11-21 DIAGNOSIS — C50919 Malignant neoplasm of unspecified site of unspecified female breast: Secondary | ICD-10-CM

## 2023-11-21 DIAGNOSIS — C50912 Malignant neoplasm of unspecified site of left female breast: Secondary | ICD-10-CM

## 2023-11-21 NOTE — Patient Instructions (Addendum)
 Explained breast self awareness with [name]. Patient did not need a Pap smear today due to last Pap smear and HPV typing was 12/11/2020. Let her know [facility] will cover Pap smears and HPV typing every 5 years unless has a history of abnormal Pap smears. Referred patient to [facility] for a diagnostic mammogram per recommendation. Appointment scheduled Monday, November 21, 2023 at 1100. Patient has follow up appointment at [facility]. Appointment scheduled Friday, November 24, 2023. Patient aware of appointments and will be there. [name] verbalized understanding.  Dewain Platz, Kathaleen Maser, RN 1:48 PM

## 2023-11-21 NOTE — Progress Notes (Addendum)
 [name] is a 48 y.o. female who presents to [facility] clinic today with complaint of left breast since mastectomy that is constant. Patient rates the pain at a 6 out of 10.    Pap Smear: Pap smear not completed today. Last Pap smear was 12/11/2020 at [name] clinic and was normal with negative HPV. Per patient has no history of an abnormal Pap smear. Last Pap smear result is available in Epic.    Physical exam: Breasts Right breast larger than left breast due to history of mastectomy due to breast cancer. No skin abnormalities right breast. Scars observed left bresat due to mastectomy No nipple retraction right breast. No nipple discharge right breast. No lymphadenopathy. No lumps palpated bilateral breasts. Complaints of tenderness when palpated left breast on exam.  MS 3D DIAG MAMMO UNI RT BR (aka MM) Result Date: 11/21/2023 CLINICAL DATA:  Patient presents complaining of a reddish skin rash, without significant discomfort, from the upper back across the inferior left axilla and lateral aspect of the breast. She has had a previous left mastectomy, it is complaining of some soft tissue prominence along the lateral aspect of the lumpectomy site extending to the left axilla, but no defined mass. No right breast complaints. EXAM: DIGITAL DIAGNOSTIC UNILATERAL RIGHT MAMMOGRAM WITH TOMOSYNTHESIS AND CAD; ULTRASOUND LEFT BREAST LIMITED TECHNIQUE: Right digital diagnostic mammography and breast tomosynthesis was performed. The images were evaluated with computer-aided detection. ; Targeted ultrasound examination of the left breast was performed. COMPARISON:  Previous exam(s). ACR Breast Density Category b: There are scattered areas of fibroglandular density. FINDINGS: There are no right breast masses, areas of architectural distortion or suspicious calcifications. No mammographic change. On physical exam, there is a reddish patchy skin rash along the lateral aspect of  the mastectomy site, with additional areas in the upper left back. No obvious vesicles. There is no apparent skin thickening. No palpable mass along the lateral left mastectomy site or in the left axilla. Targeted ultrasound is performed, showing normal tissue throughout the lateral aspect of the left breast/mastectomy site and left axilla. Normal left axillary lymph nodes are noted. No masses. No enlarged lymph nodes. No fluid collections. IMPRESSION: 1. No evidence of breast malignancy. 2. Indeterminate left-sided skin rash as detailed. RECOMMENDATION: 1. Right breast screening mammogram in 1 year. 2. Dermatological follow-up for the left-sided skin rash I have discussed the findings and recommendations with the patient. If applicable, a reminder letter will be sent to the patient regarding the next appointment. BI-RADS CATEGORY  1: Negative. Electronically Signed   By: Amie Portland M.D.   On: 11/21/2023 14:41   MM DIAG BREAST TOMO UNI LEFT Result Date: 07/29/2022 CLINICAL DATA:  Follow-up known left breast cancer. Known malignancy. EXAM: DIGITAL DIAGNOSTIC UNILATERAL LEFT MAMMOGRAM WITH TOMOSYNTHESIS; ULTRASOUND LEFT BREAST LIMITED TECHNIQUE: Left digital diagnostic mammography and breast tomosynthesis was performed.; Targeted ultrasound examination of the left breast was performed. COMPARISON:  Previous exam(s). ACR Breast Density Category b: There are scattered areas of fibroglandular density. FINDINGS: Left-sided skin thickening remains with continues to improve. There is decreasing density throughout the left breast suggesting further improvement. Targeted ultrasound is performed, showing no adenopathy in the left axilla. Ultrasound of the left breast is difficult due to the irregular shape of the originally identified masses. Also, some of the findings may now represent scarring or fibrosis after chemotherapy. The mass in the left breast at 11 o'clock, 2 cm from the nipple measures 13 x  5 x 11 mm today  versus 17 x 6 x 13 mm the May 19, 2022 study and 33 x 17 x 35 mm on the March 30, 2022 study. The mass identified at 12 o'clock, 2 cm from the nipple measures 35 x 10 x 25 mm today versus 29 x 13 x 26 mm previously. Despite the measurements, the overall volume of abnormality appears smaller. This mass measured 48 by 20 x 41 mm on March 30, 2022. The mass at 2 o'clock, 4 cm from the nipple measures 20 x 18 x 22 mm today versus 35 x 17 x 17 mm by my measurement May 19, 2022. This mass measured 27 x 27 x 30 mm March 30, 2022. Mass at 3 o'clock, 4 cm from the nipple measures 27 x 10 by 27 mm today versus 26 6 x 24 mm May 19, 2022 and 46 by 21 x 33 mm March 30, 2022. IMPRESSION: The masses in the left breast are difficult to measure given the irregular shape. Also, at least some of the measurement could include post treatment fibrosis/scar rather than tumor. See above for measurements. Overall, there is marked improvement since March 30, 2022. The volume of disease also, overall, appears decreased on today's study versus May 19, 2022. No axillary adenopathy. RECOMMENDATION: Recommend continued follow-up with oncology and surgery. I have discussed the findings and recommendations with the patient. If applicable, a reminder letter will be sent to the patient regarding the next appointment. BI-RADS CATEGORY  6: Known biopsy-proven malignancy. Electronically Signed   By: Gerome Sam III M.D.   On: 07/29/2022 10:55  MM DIAG BREAST TOMO BILATERAL Result Date: 05/19/2022 CLINICAL DATA:  Follow-up known left breast cancer. EXAM: DIGITAL DIAGNOSTIC BILATERAL MAMMOGRAM WITH TOMOSYNTHESIS; ULTRASOUND LEFT BREAST LIMITED TECHNIQUE: Bilateral digital diagnostic mammography and breast tomosynthesis was performed.; Targeted ultrasound examination of the left breast was performed. COMPARISON:  Previous exam(s). ACR Breast Density Category b: There are scattered areas of fibroglandular density. FINDINGS: The right  skin thickening identified on the previous study has resolved. No suspicious masses, calcifications, or distortion identified in the right breast. Skin thickening remains on the left but has significantly improved in the interval. The multiple obscured masses in the left breast have improved in the interval. In particular, there is less medial extension. No other suspicious mammographic findings on the left. Targeted ultrasound is performed, showing continued masses in the left breast which have improved in the interval. The mass at 11 o'clock, 2 cm from the nipple measures 1.7 x 0.6 x 1.3 cm today versus 3.3 x 1.7 x 3.5 cm previously. Mass at 12 o'clock, 2 cm from the nipple measures 2.9 by 2.6 x 1.3 cm today versus 4.8 by 4.1 x 2.0 cm previously. The mass at 2 o'clock, 4 cm from the nipple measures 1.7 by 2.3 x 1.4 cm today versus 2.7 by 3.0 x 2.7 cm previously. The mass at 3 o'clock, 4 cm from the nipple measures 2.4 x 0.6 by 2.6 cm today versus 3.3 x 2.1 x 4.6 cm previously. Both previously identified axillary lymph nodes are significantly improved. One of the nodes contains a HydroMARK biopsy clip. One of the nodes demonstrates a cortex measuring 3.5 mm today and the other demonstrates a cortex measuring 3.5 mm today. The lymph nodes are now borderline in appearance. IMPRESSION: Persistent but significantly improved malignancy in the left breast as above. Each of the 4 masses measured in the left breast previously are smaller. The left axillary nodes  are improved. The left skin thickening persists but is improved. The right skin thickening has resolved. No evidence of malignancy in the right breast. RECOMMENDATION: Recommend continued surgical and oncologic follow up. I have discussed the findings and recommendations with the patient. If applicable, a reminder letter will be sent to the patient regarding the next appointment. BI-RADS CATEGORY  6: Known biopsy-proven malignancy. Electronically Signed   By: Gerome Sam III M.D.   On: 05/19/2022 17:01  MM CLIP PLACEMENT RIGHT Result Date: 04/12/2022 CLINICAL DATA:  Evaluate HydroMARK biopsy clip placement following ultrasound-guided RIGHT axillary lymph node biopsy. EXAM: 3D DIAGNOSTIC RIGHT MAMMOGRAM POST ULTRASOUND BIOPSY COMPARISON:  Previous exam(s). FINDINGS: 3D Mammographic images were obtained following ultrasound guided biopsy of a RIGHT axillary lymph node with mild cortical thickening. The [facility] biopsy marking clip is in expected position at the site of biopsy. IMPRESSION: Appropriate positioning of the HydroMARK shaped biopsy marking clip at the site of biopsy in the low RIGHT axillary lymph node. Final Assessment: Post Procedure Mammograms for Marker Placement Electronically Signed   By: Harmon Pier M.D.   On: 04/12/2022 15:56  MM CLIP PLACEMENT LEFT Result Date: 04/04/2022 CLINICAL DATA:  Assess post biopsy marker clip placement following ultrasound-guided core needle biopsy left breast mass and left axillary lymph node. EXAM: 3D DIAGNOSTIC LEFT MAMMOGRAM POST ULTRASOUND BIOPSY COMPARISON:  Previous exam(s). FINDINGS: 3D Mammographic images were obtained following ultrasound guided biopsy of the left breast and left axilla. The ribbon shaped biopsy clip is in the expected location the biopsied, 2 o'clock position, mass. The [facility] biopsy clip is seen within rounded left axillary lymph node. IMPRESSION: Appropriate positioning of the ribbon shaped and HydroMARK biopsy marking clips at the site of biopsy in the upper outer left breast and left axilla respectively. Final Assessment: Post Procedure Mammograms for Marker Placement Electronically Signed   By: Amie Portland M.D.   On: 04/04/2022 14:34  MM DIAG BREAST TOMO BILATERAL Result Date: 03/30/2022 CLINICAL DATA:  The patient was diagnosed with breast cancer in 2021. The patient presented due to redness and swelling in the left breast Jan 18, 2022. Skin thickening was identified. Punch biopsy,  by report, demonstrated inflammatory breast cancer. The patient presents with redness and erythema in the medial inferior right breast. EXAM: DIGITAL DIAGNOSTIC BILATERAL MAMMOGRAM WITH TOMOSYNTHESIS AND CAD; ULTRASOUND LEFT BREAST LIMITED; ULTRASOUND RIGHT BREAST LIMITED TECHNIQUE: Bilateral digital diagnostic mammography and breast tomosynthesis was performed. The images were evaluated with computer-aided detection.; Targeted ultrasound examination of the left breast was performed.; Targeted ultrasound examination of the right breast was performed COMPARISON:  Previous exam(s). ACR Breast Density Category b: There are scattered areas of fibroglandular density. FINDINGS: There is skin thickening in the inferior medial right breast. There is a prominent node in the right axilla. No other suspicious findings are identified on the right. Skin thickening on the left has worsened since May of 2023. There are multiple masses centered in the upper outer quadrant of the left breast not identified on Jan 18, 2022. Targeted ultrasound is performed, showing no sonographic abnormality in the right breast. There is a borderline to mildly prominent lymph node in the right axilla with a cortex measuring between 3 and 5 mm. There are numerous masses throughout the left breast. A mass at 11 o'clock, 2 cm from the nipple measures 3.5 x 3.3 x 1.7 cm. A mass at 12 o'clock, 2 cm from the nipple measures 4.8 by 4.1 x 2.0 cm. A mass at 2 o'clock,  4 cm from the nipple measures 2.7 x 3.0 x 2.7 cm. A mass at 3 o'clock, 4 cm from the nipple measures 3.3 x 2.1 x 4.6 cm. Two abnormal lymph nodes are identified in the right axilla with thickened cortices. One lymph node demonstrates a cortex measuring 5.8 mm. The other lymph node demonstrates restriction of the fatty hilum and thickening of the cortex. IMPRESSION: 1. Numerous masses are identified in the left breast centered in the upper outer quadrant. The masses do extend medial to the nipple.  See above for details. These masses were not identified in May of 2029 and are all concerning for malignancy. 2. Two abnormal lymph nodes in the left axilla are concerning for metastatic nodes. 3. Skin thickening in the inferior medial right breast could be seen with inflammatory breast cancer or infection. The patient has no signs of infection and reports no pain in this region. The findings are concerning for inflammatory breast cancer but an underlying mass is not identified. 4. There is a borderline to mildly abnormal lymph node in the right axilla. A metastatic node is not excluded. RECOMMENDATION: The patient should be seen by her oncologist to direct treatment. We could certainly biopsy 1 or more masses in the left breast, the left axillary lymph node, and/or the right axillary node if it will change clinical management. The skin thickening in the inferior medial right breast would be best assessed with a punch biopsy as there is no underlying mass. I have discussed the findings and recommendations with the patient. If applicable, a reminder letter will be sent to the patient regarding the next appointment. BI-RADS CATEGORY  4: Suspicious. Electronically Signed   By: Gerome Sam III M.D.   On: 03/30/2022 18:27  MM DIAG BREAST TOMO BILATERAL Result Date: 01/18/2022 CLINICAL DATA:  Patient with history of left breast lumpectomy and radiation treatment in 2021. Patient presents today for follow-up and reports new redness and swelling of the left breast. Patient was recently seen by Oncology for the breast redness. The redness did not resolve with antibiotic treatment. EXAM: DIGITAL DIAGNOSTIC BILATERAL MAMMOGRAM WITH TOMOSYNTHESIS AND CAD; ULTRASOUND LEFT BREAST LIMITED TECHNIQUE: Bilateral digital diagnostic mammography and breast tomosynthesis was performed. The images were evaluated with computer-aided detection.; Targeted ultrasound examination of the left breast was performed. COMPARISON:  Previous  exam(s). ACR Breast Density Category c: The breast tissue is heterogeneously dense, which may obscure small masses. FINDINGS: Interval development of diffuse skin thickening overlying the left breast. Additionally, there is increased parenchymal and trabecular thickening throughout the left breast. No definite discrete mass is identified. Postlumpectomy changes within the left breast. Stable appearance of the right breast. On physical exam, there is increased redness of the skin of the left breast. Targeted ultrasound is performed, showing extensive skin thickening overlying the left breast. Additionally there is underlying edema throughout the left breast. No discrete mass or fluid collection. IMPRESSION: Interval development of diffuse skin thickening overlying left breast with associated edema as well as parenchyma and trabecular thickening throughout the left breast. Patient has recently taken antibiotics without interval change. Considerations for the appearance of the left breast include post radiation changes, infectious process or inflammatory carcinoma. RECOMMENDATION: Skin punch biopsy of the left breast to evaluate for the possibility of inflammatory carcinoma. Additionally, recommend continued clinical follow-up and possible repeat antibiotic therapy as clinically indicated for possible mastitis. Additionally, recommend follow-up left breast diagnostic mammogram in 2 months to reassess after the skin punch biopsy and possible retreatment with  antibiotics. I have discussed the findings and recommendations with the patient. If applicable, a reminder letter will be sent to the patient regarding the next appointment. BI-RADS CATEGORY  4: Suspicious. Electronically Signed   By: Annia Belt M.D.   On: 01/18/2022 11:16  MS DIGITAL DIAG TOMO BILAT Result Date: 02/23/2021 CLINICAL DATA:  Patient with history of left breast lumpectomy. EXAM: DIGITAL DIAGNOSTIC BILATERAL MAMMOGRAM WITH TOMOSYNTHESIS AND CAD  TECHNIQUE: Bilateral digital diagnostic mammography and breast tomosynthesis was performed. The images were evaluated with computer-aided detection. COMPARISON:  Previous exam(s). ACR Breast Density Category c: The breast tissue is heterogeneously dense, which may obscure small masses. FINDINGS: Interval postlumpectomy changes left breast. No concerning masses, calcifications or nonsurgical distortion identified within the left breast. IMPRESSION: No mammographic evidence for malignancy. Postlumpectomy changes left breast. RECOMMENDATION: Bilateral diagnostic mammography 1 year. I have discussed the findings and recommendations with the patient. If applicable, a reminder letter will be sent to the patient regarding the next appointment. BI-RADS CATEGORY  2: Benign. Electronically Signed   By: Annia Belt M.D.   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 EXAM: SPECIMEN RADIOGRAPH OF THE LEFT BREAST X 2 SPECIMEN RADIOGRAPH OF THE LEFT AXILLA COMPARISON:  Previous exam(s). FINDINGS: Status post excision of the left breast and left axilla. The ribbon shaped biopsy marker clip, radioactive seed and small calcifications within an ill-defined irregular mass are present in one breast specimen, completely intact, and were marked for pathology. The coil shaped biopsy marker clip, radioactive seed and a small oval mass are present in the other breast specimen, completely intact, and marked for pathology. The Q shaped biopsy marker clip within an oval mass are present in the axillary  specimen, completely intact and more marked for pathology. The left axillary radioactive seed is in the specimen container separate from the specimen. This is also completely intact. IMPRESSION: Specimen radiographs of the left breast and left axilla. Electronically Signed   By: Beckie Salts M.D.   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 EXAM: SPECIMEN RADIOGRAPH OF THE LEFT BREAST X 2 SPECIMEN RADIOGRAPH OF THE LEFT AXILLA COMPARISON:  Previous exam(s). FINDINGS: Status post excision of the left breast and left axilla. The ribbon shaped biopsy marker clip, radioactive seed and small calcifications within an ill-defined irregular mass are present in one breast specimen, completely intact, and were marked for pathology. The coil shaped biopsy marker clip, radioactive seed and a small oval mass are present in the other breast specimen, completely intact, and marked for pathology. The Q shaped biopsy marker clip within an oval mass are present in the axillary specimen, completely intact and  more marked for pathology. The left axillary radioactive seed is in the specimen container separate from the specimen. This is also completely intact. IMPRESSION: Specimen radiographs of the left breast and left axilla. Electronically Signed   By: Beckie Salts M.D.   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 EXAM: SPECIMEN RADIOGRAPH OF THE LEFT BREAST X 2 SPECIMEN RADIOGRAPH OF THE LEFT AXILLA COMPARISON:  Previous exam(s). FINDINGS: Status post excision of the left breast and left axilla. The ribbon shaped biopsy marker clip, radioactive seed and small calcifications within an ill-defined irregular mass are present in one breast specimen, completely intact, and were marked for pathology. The coil shaped biopsy marker clip, radioactive seed and a small oval mass are present in the other breast specimen, completely intact, and marked for pathology. The Q shaped biopsy marker clip within an oval mass are present in the axillary specimen, completely intact and more marked for pathology. The left axillary radioactive seed is in the specimen container separate from the specimen. This is also completely intact. IMPRESSION: Specimen radiographs of the left breast and left axilla. Electronically Signed   By: Beckie Salts M.D.   On: 07/15/2020 16:57   MM LT RADIOACTIVE SEED LOC MAMMO GUIDE Result Date: 07/14/2020 CLINICAL DATA:  48 year old female presenting for radioactive seed localization of a left breast mass prior to lumpectomy. EXAM: MAMMOGRAPHIC GUIDED RADIOACTIVE SEED LOCALIZATION OF THE LEFT BREAST COMPARISON:  Previous exam(s). FINDINGS: Patient presents for radioactive seed localization prior to left breast lumpectomy. I met with the patient and we discussed the procedure of seed localization including benefits and alternatives. We discussed the high likelihood of a successful procedure. We discussed the risks of the procedure including infection, bleeding, tissue injury and further surgery. We discussed the low dose of radioactivity involved in the  procedure. Informed, written consent was given. The usual time-out protocol was performed immediately prior to the procedure. Using mammographic guidance, sterile technique, 1% lidocaine and an I-125 radioactive seed, the ribbon shaped biopsy marking clip at the site of the known malignancy in the upper-outer quadrant of the left breast was localized using a lateral approach. The follow-up mammogram images confirm the seed in the expected location and were marked for Dr. Dwain Sarna. Follow-up survey of the patient confirms presence of the radioactive seed. Order number of I-125 seed:  [id number]. Total activity:  0.246 millicuries reference Date: 06/16/2020 The patient tolerated the procedure well and was [facility]. She was given instructions regarding seed removal. IMPRESSION: Radioactive seed localization left breast. No apparent complications. Electronically Signed   By: Frederico Hamman M.D.   On: 07/14/2020 11:51   MM CLIP PLACEMENT LEFT Result Date: 01/23/2020 CLINICAL DATA:  Status post 3 ultrasound-guided biopsies today. EXAM: DIAGNOSTIC LEFT MAMMOGRAM POST ULTRASOUND BIOPSY x3 COMPARISON:  Previous exam(s). FINDINGS: Mammographic images were obtained following ultrasound guided biopsies of masses in the LEFT breast at the 1 o'clock axis and 2 o'clock axis followed by ultrasound-guided biopsy of an enlarged lymph node in the LEFT axilla. All 3 biopsy marking clips are  in expected position at the sites of biopsy. IMPRESSION: 1. Appropriate positioning of the ribbon shaped biopsy marking clip at the site of biopsy in the upper-outer quadrant of the LEFT breast corresponding to the mass at the 1 o'clock axis. 2. Appropriate positioning of the coil shaped biopsy marking clip at the site of biopsy in the upper-outer quadrant of the LEFT breast corresponding to the mass at the 2 o'clock axis. 3. Appropriate positioning of the Q shaped biopsy marking clip at the site of biopsy in the LEFT  axilla. Final Assessment: Post Procedure Mammograms for Marker Placement Electronically Signed   By: Bary Richard M.D.   On: 01/23/2020 15:49   MS DIGITAL DIAG TOMO BILAT Result Date: 01/23/2020 CLINICAL DATA:  48 year old female presenting with a new lump in the left breast. EXAM: DIGITAL DIAGNOSTIC BILATERAL MAMMOGRAM WITH CAD AND TOMO ULTRASOUND LEFT BREAST COMPARISON:  None. ACR Breast Density Category b: There are scattered areas of fibroglandular density. FINDINGS: Mammogram: Right breast: No suspicious mass, distortion, or microcalcifications are identified to suggest presence of malignancy. Left breast: Spot compression tomosynthesis views were performed in addition to full field standard views. There is an irregular spiculated mass at the palpable site of concern in the upper-outer quadrant of the left breast. The mass spans approximately 3.5 cm. Associated with the mass are several pleomorphic microcalcifications. In the far upper outer quadrant of the left breast there is an oval circumscribed mass measuring 0.9 cm, likely an intramammary lymph node. There are several enlarged left axillary lymph nodes. Mammographic images were processed with CAD. On physical exam, I palpate a fixed discrete mass at the palpable site of concern in the upper outer left breast. Ultrasound: Targeted ultrasound is performed in the left breast at 1 o'clock 5 cm from nipple demonstrating an irregular hypoechoic mass measuring 3.4 x 2.1 x 3.6 cm, which corresponds to the palpable abnormality. There is internal vascularity within the mass. Targeted ultrasound in the left breast at 2 o'clock 10 cm from the nipple demonstrates a round circumscribed hypoechoic mass with hilar flow measuring 0.8 x 0.8 x 0.8 cm, consistent with an intramammary lymph node. There is loss of the normal fatty hilum. Targeted ultrasound of the left axilla demonstrates at least two abnormal lymph nodes with cortical thickening measuring up to 1.7 cm.  IMPRESSION: 1. Palpable left breast mass with associated calcifications overall measuring 3.6 cm is highly suspicious. 2. Left breast mass at 2 o'clock 10 cm from nipple is concerning for an abnormal intramammary lymph node. 3.  Left axillary adenopathy (at least 2 lymph nodes). RECOMMENDATION: Ultrasound-guided core needle biopsy x 3 for the above findings. This will be performed same day. I have discussed the findings and recommendations with the patient. BI-RADS CATEGORY  5: Highly suggestive of malignancy. Electronically Signed   By: Emmaline Kluver M.D.   On: 01/23/2020 13:36      Pelvic/Bimanual Pap is not indicated today per BCCCP guidelines.   Smoking History: Patient has never smoked.   Patient Navigation: Patient education provided. Access to services provided for patient through [place] program. Spanish interpreter Natale Lay from [facility] provided.   Colorectal Cancer Screening: Per patient has never had colonoscopy completed. FIT Test given to patient to complete. No complaints today.    Breast and Cervical Cancer Risk Assessment: Patient does not have family history of breast cancer. Patient has a personal history of breast cancer. Patient has no known genetic mutations or history of radiation treatment to the chest before  age 57. Patient does not have history of cervical dysplasia, immunocompromised, or DES exposure in-utero. Breast cancer risk assessment was completed. No breast cancer risk was calculated due to patient has history of breast cancer.  Risk Assessment   No risk assessment data     A: BCCCP exam without pap smear Complaint of left breast pain.  P: Referred patient to the [facility] for a diagnostic mammogram per recommendation. Appointment scheduled Monday, November 21, 2023 at 1100.  Patient has follow up appointment at [facility]. Appointment scheduled Friday, November 24, 2023.  Priscille Heidelberg, RN 11/21/2023 1:48 PM

## 2023-11-22 ENCOUNTER — Inpatient Hospital Stay

## 2023-11-22 DIAGNOSIS — Z95828 Presence of other vascular implants and grafts: Secondary | ICD-10-CM

## 2023-11-22 MED ORDER — SODIUM CHLORIDE 0.9% FLUSH
10.0000 mL | Freq: Once | INTRAVENOUS | Status: AC
  Administered 2023-11-22: 10 mL

## 2023-11-22 MED ORDER — HEPARIN SOD (PORK) LOCK FLUSH 100 UNIT/ML IV SOLN
250.0000 [IU] | Freq: Once | INTRAVENOUS | Status: AC
  Administered 2023-11-22: 250 [IU]

## 2023-11-27 ENCOUNTER — Ambulatory Visit (INDEPENDENT_AMBULATORY_CARE_PROVIDER_SITE_OTHER): Payer: No Typology Code available for payment source | Admitting: Internal Medicine

## 2023-11-30 ENCOUNTER — Ambulatory Visit: Payer: No Typology Code available for payment source

## 2023-11-30 ENCOUNTER — Telehealth: Payer: Self-pay | Admitting: *Deleted

## 2023-11-30 NOTE — Telephone Encounter (Addendum)
-----   Message from [place] sent at 11/22/2023  4:54 PM EDT ----- [place] neg for malignancy.  Pt called and notified.

## 2023-12-06 ENCOUNTER — Ambulatory Visit: Payer: Self-pay

## 2023-12-07 ENCOUNTER — Other Ambulatory Visit: Payer: Self-pay

## 2023-12-07 ENCOUNTER — Ambulatory Visit: Payer: Self-pay | Attending: General Surgery

## 2023-12-07 DIAGNOSIS — I972 Postmastectomy lymphedema syndrome: Secondary | ICD-10-CM | POA: Insufficient documentation

## 2023-12-07 DIAGNOSIS — Z483 Aftercare following surgery for neoplasm: Secondary | ICD-10-CM | POA: Insufficient documentation

## 2023-12-07 DIAGNOSIS — M25612 Stiffness of left shoulder, not elsewhere classified: Secondary | ICD-10-CM | POA: Insufficient documentation

## 2023-12-07 NOTE — Therapy (Signed)
 OUTPATIENT PHYSICAL THERAPY  UPPER EXTREMITY ONCOLOGY EVALUATION  Patient Name: [name] MRN: [id number] DOB:[birth year], 48 y.o., female Today's Date: 12/07/2023  END OF SESSION:  PT End of Session - 12/07/23 1347     Visit Number 1    Number of Visits 12    Date for PT Re-Evaluation 01/18/24    PT Start Time 1003    PT Stop Time 1100    PT Time Calculation (min) 57 min    Activity Tolerance Patient tolerated treatment well    Behavior During Therapy [facility] for tasks assessed/performed             Past Medical History:  Diagnosis Date   Allergy    Asthma    Back pain    Breast cancer in female [facility])    Left   Breast disorder    breast cancer May 2021   Chronic back pain    Fatty liver    Headache    High triglycerides    History of breast cancer    History of COVID-19 04/20/2021   Hx of migraines    Morbid obesity (HCC)    Personal history of chemotherapy    Personal history of radiation therapy    SOB (shortness of breath)    Vitamin D deficiency    Past Surgical History:  Procedure Laterality Date   BREAST LUMPECTOMY     BREAST LUMPECTOMY WITH RADIOACTIVE SEED AND SENTINEL LYMPH NODE BIOPSY Left 07/15/2020   Procedure: LEFT BREAST LUMPECTOMY WITH RADIOACTIVE SEED AND LEFT AXILLARY SENTINEL LYMPH NODE BIOPSY, LEFT AXILLARY NODE RADIOACTIVE SEED GUIDED EXCISION, LEFT BREAST RADIOACTIVE SEED GUIDED EXCISION IM NODE;  Surgeon: Emelia Loron, MD;  Location: [facility] OR;  Service: General;  Laterality: Left;  PEC BLOCK   CESAREAN SECTION WITH BILATERAL TUBAL LIGATION Bilateral 07/23/2015   Procedure: CESAREAN SECTION WITH BILATERAL TUBAL LIGATION;  Surgeon: Reva Bores, MD;  Location: [facility]S;  Service: Obstetrics;  Laterality: Bilateral;   IR IMAGING GUIDED PORT INSERTION  02/11/2022   MASTECTOMY Left    MODIFIED MASTECTOMY Left 08/22/2022   Procedure: LEFT MODIFIED RADICAL MASTECTOMY, LEFT AXILLARY NODE DISSECTION, AND SKIN BIOPSY OF BACK;  Surgeon:  Emelia Loron, MD;  Location: [facility] ORS;  Service: General;  Laterality: Left;   PORT-A-CATH REMOVAL N/A 04/30/2021   Procedure: REMOVAL PORT-A-CATH;  Surgeon: Emelia Loron, MD;  Location: [facility];  Service: General;  Laterality: N/A;   PORTACATH PLACEMENT Right 02/11/2020   Procedure: INSERTION PORT-A-CATH WITH ULTRASOUND GUIDANCE;  Surgeon: Emelia Loron, MD;  Location: [facility];  Service: General;  Laterality: Right;   TUBAL LIGATION     Patient Active Problem List   Diagnosis Date Noted   Prediabetes 08/22/2023   Abnormal craving 08/22/2023   Pure hypercholesterolemia 08/22/2023   Has health insurance with inadequate coverage of health expenses 08/22/2023   Elevated blood sugar 08/08/2023   Metabolic dysfunction-associated steatotic liver disease (MASLD) 08/08/2023   Class 2 severe obesity with serious comorbidity and body mass index (BMI) of 35.0 to 35.9 in adult [facility]) 08/08/2023   Vitamin B12 deficiency 02/03/2023   Vitamin D deficiency 11/04/2022   Health care maintenance 09/30/2022   S/P left mastectomy 08/22/2022   Burning with urination 06/01/2022   Swelling of left upper extremity 05/18/2022   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) 04/01/2022   Encounter for dental examination 03/11/2022   Acute apical periodontitis 03/11/2022   Phobia of dental procedure 03/11/2022   Defective  dental restoration 03/11/2022   Accretions on teeth 03/11/2022   Chronic periodontitis 03/11/2022   Teeth missing 03/11/2022   Caries 03/11/2022   Generalized gingival recession 03/11/2022   Pain, dental 03/08/2022   Cancer-related pain 03/08/2022   Port-A-Cath in place 02/15/2022   Oral allergy syndrome, subsequent encounter 02/03/2022   Adverse effect of other drugs, medicaments and biological substances, subsequent encounter 02/02/2022   Other adverse food reactions, not elsewhere classified, subsequent encounter 02/02/2022   Chest tightness  02/02/2022   Other allergic rhinitis 02/02/2022   Allergic conjunctivitis of both eyes 02/02/2022   Language barrier 12/11/2020   Morbid obesity (HCC)    History of breast cancer    Genetic testing 02/13/2020   Malignant neoplasm of upper-outer quadrant of left breast in female, estrogen receptor positive (HCC) 01/27/2020   Impaired ability to use community resources due to language barrier 07/13/2013    PCP:   REFERRING PROVIDER: Emelia Loron, MD  REFERRING DIAG: Lymphedema  THERAPY DIAG:  Postmastectomy lymphedema  Stiffness of left shoulder, not elsewhere classified  Aftercare following surgery for neoplasm  ONSET DATE:11 /20/2023 with recent exacerbation  Rationale for Evaluation and Treatment: Rehabilitation  SUBJECTIVE:                                                                                                                                                                                           SUBJECTIVE STATEMENT:  She sees and feels the arm more swollen, the chest and her back. She wants to have it checked to  be sure . The arm feels heavy and she has mild pain in Her entire left arm. It feels different that her normal pain, like its in the bone. Her sleeve is about 30 months old now. She has a night garment, but she doesn't wear it every night because her arm feels trapped. She gets tired of it because it is annoying. Sometimes she doesn' wear the glove when doing housework, dishes etc. I still feel pulling in my armpit with reaching.  PERTINENT HISTORY:  Patient was diagnosed on 01/23/2020 with left grade III invasive ductal carcinoma breast cancer. It is ER positive, PR negative, and HER2 negative , but functionally Triple Negative,with a Ki67 of 40%. Patient with interpreter present. She underwent neoadjuvant chemotherapy from 02/12/2020 - 05/28/2020. 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 She underwent a left lumpectomy and sentinel node  biopsy ( 4 negative nodes) on 07/15/2020. She underwent radiation and anti-estrogen therapy.  She had interval development of diffuse skin thickening in 2023. She had a punch biopsy of her skin at that time and  this was consistent with inflammatory breast cancer. She has been treated with chemo since then. She had a left modified radical mastectomy on 08/22/2022 with complete therapeutic response and 0/1 LN. She noted some swelling in her left arm and on 08/25/2022 she had an [place] that was negative for DVT. Her SOZO screen on 09/19/2021 was 6.9 points above baseline putting her in the yellow zone and she was given a compression sleeve that was not containing  her so a flat knit sleeve and glove and nighttime garment  have been ordered   PAIN:  Are you having pain? Yes NPRS scale: 4/10 Pain location: left arm Pain orientation: Left  PAIN TYPE: aching Pain description: intermittent  Aggravating factors: can't sleep on left side Relieving factors: laying down flat  PRECAUTIONS: lymphedema [name]   RED FLAGS: Bowel or bladder incontinence: No   WEIGHT BEARING RESTRICTIONS: No  FALLS:  Has patient fallen in last 6 months? No  LIVING ENVIRONMENT: Lives with: lives with their family and lives with their daughters, 25, 24, 72 Lives in: House/apartment Stairs: No;  Has following equipment at home: None  OCCUPATION: No  LEISURE: walking,  HAND DOMINANCE: right   PRIOR LEVEL OF FUNCTION: Independent  PATIENT GOALS: To see if her arm is getting bigger, she can't reach her back to massage.   OBJECTIVE: Note: Objective measures were completed at Evaluation unless otherwise noted.  COGNITION: Overall cognitive status: Within functional limits for tasks assessed   PALPATION:   OBSERVATIONS / OTHER ASSESSMENTS: increased swelling at left chest/axilla and left lateral trunk and back, No pitting edema in arm  SENSATION: Light touch: Deficits     POSTURE: Forward head, rounded  shoulders UPPER EXTREMITY AROM/PROM:  A/PROM RIGHT   eval   Shoulder extension   Shoulder flexion 155  Shoulder abduction 175  Shoulder internal rotation   Shoulder external rotation     (Blank rows = not tested)  A/PROM LEFT   eval  Shoulder extension   Shoulder flexion 149  Shoulder abduction 150  Shoulder internal rotation   Shoulder external rotation     (Blank rows = not tested)  CERVICAL AROM: All within normal limits:      UPPER EXTREMITY STRENGTH:   SURGERY TYPE/DATE: Left Lumpectomy with SLNB11/11/2019, Left modified mastectomy with left axillary node dissection 08/22/2022 NUMBER OF LYMPH NODES REMOVED: 0/4 with lumpectomy, 0/1 with Mastectomy   CHEMOTHERAPY: YES  RADIATION:YES  HORMONE TREATMENT:   INFECTIONS: IBC   LYMPHEDEMA ASSESSMENTS:   LANDMARK RIGHT  eval  At axilla  39.0  15 cm proximal to olecranon process   10 cm proximal to olecranon process 34.3  Olecranon process 28.2  15 cm proximal to ulnar styloid process 28.4  10 cm proximal to ulnar styloid process 26.9  Just proximal to ulnar styloid process 18.8  Across hand at thumb web space 20.5  At base of 2nd digit 6.35  (Blank rows = not tested)  LANDMARK LEFT  eval  At axilla  40.4  15 cm proximal to olecranon process   10 cm proximal to olecranon process 34.5  Olecranon process 28.2  15 cm proximal to ulnar styloid process 28.1  10 cm proximal to ulnar styloid process 26.5  Just proximal to ulnar styloid process 18.8  Across hand at thumb web space 20.25  At base of 2nd digit 6.25  (Blank rows = not tested)   FUNCTIONAL TESTS:    GAIT: WNL   L-DEX LYMPHEDEMA SCREENING: The patient was assessed  using the L-Dex machine today to produce a lymphedema index baseline score. The patient will be reassessed on a regular basis (typically every 3 months) to obtain new L-Dex scores. If the score is > 6.5 points away from his/her baseline score indicating onset of subclinical  lymphedema, it will be recommended to wear a compression garment for 4 weeks, 12 hours per day and then be reassessed. If the score continues to be > 6.5 points from baseline at reassessment, we will initiate lymphedema treatment. Assessing in this manner has a 95% rate of preventing clinically significant lymphedema.  QUICK DASH SURVEY:   Lymphedema Life Impact Scale; 44%                                                                                                                            TREATMENT DATE:  12/07/2023 Pt with questions about lymphedema and if her arm will continue to get bigger. Explained management of lymphedema and why it is important and went over measurements with her. Explained that her arm is doing quite well, but she does require new garments as they are nearly 56 months old. We will also try to get her a Compression pump from [facility]. We will start with MLD to the chest, trunk and arm and review with her.. Encourage night garment as much as possible.She will also benefit from some stretches for her left shoulder due to tightness in the chest and axilla.   PATIENT EDUCATION:  Education details: lymphedema, need for new garments, will put in for compression pump again Person educated: Patient, interpreter Education method: Explanation Education comprehension: verbalized understanding  HOME EXERCISE PROGRAM: None given today  ASSESSMENT:  CLINICAL IMPRESSION: Patient is a 48 y.o. female who was seen today for physical therapy evaluation and treatment for complaints of left UE swelling, heaviness and achiness, as well as swelling in the chest, lateral trunk and upper back. Her arm circumference measurements are quite good and only mildly increased in the upper arm. She has mildly decreased left shoulder ROM due to tightness in the chest and axilla. She requires new compression garments and would also benefit from a compression pump to assist with arm and trunk  edema. She is set up to be measured on 12/13/2023 for new garments.She will benefit from skilled PT to address deficits and enable her to be independent in self management,   OBJECTIVE IMPAIRMENTS: decreased activity tolerance, decreased knowledge of condition, decreased ROM, increased edema, impaired flexibility, impaired UE functional use, postural dysfunction, and pain.   ACTIVITY LIMITATIONS: carrying, sleeping, and reach over head  PARTICIPATION LIMITATIONS:  pt is doing what is required of her with some restrictions with activities requiring reaching overhead.  PERSONAL FACTORS: 3+ comorbidities: Left breast cancer s/p 2 surgeries,chemo and radiation  are also affecting patient's functional outcome.   REHAB POTENTIAL: Good  CLINICAL DECISION MAKING: Stable/uncomplicated  EVALUATION COMPLEXITY: Low  GOALS: Goals reviewed with patient? Yes  SHORT TERM GOALS: Target  date: 12/28/2023  Pt will be independent in MLD to the left UE, chest, lateral trunk Baseline: Goal status: INITIAL  2.  Pt will be independent in HEP to improve left shoulder ROM/strength Baseline:  Goal status: INITIAL  3.  Pt will note decreased left UE achiness by 25% Baseline:  Goal status: INITIAL  4.  Pt will be compliant with compression bra to reduce trunk edema Baseline:  Goal status: INITIAL    LONG TERM GOALS: Target date: 01/18/2024  Pt will be approved for a sequential compression pump to decrease left trunk/arm lymphedema Baseline:  Goal status: INITIAL  2.  Pt will have achiness in eft arm reduced by 50% or greater Baseline:  Goal status: INITIAL  3.  Pt will have left shoulder abd atleast 165 degrees for improved reaching Baseline:  Goal status: INITIAL  4.  Pt will note decreased chest and trunk edema by 50% Baseline:  Goal status: INITIAL  5.  Pt will have new, appropriate compression garments to maintain left UE edema Baseline:  Goal status: INITIAL   PLAN:  PT FREQUENCY:  2x/week  PT DURATION: 6 weeks  PLANNED INTERVENTIONS: 97164- PT Re-evaluation, 97110-Therapeutic exercises, 97530- Therapeutic activity, 97112- Neuromuscular re-education, 97535- Self Care, [id number]- Manual therapy, 97760- Orthotic Fit/training, and Patient/Family education  PLAN FOR NEXT SESSION: shoulder wall climbs for abduction or pulleys, PROM prn, Initiate MLD to left UE, lateral trunk, back and start instructing pt, (pt to be measured for new garments on Wed),Measure periodically, initiate pump with Suzzette Righter, PT 12/07/2023, 1:48 PM

## 2023-12-09 LAB — SIGNATERA
SIGNATERA MTM READOUT: 0 MTM/ml
SIGNATERA TEST RESULT: NEGATIVE

## 2023-12-11 ENCOUNTER — Ambulatory Visit: Payer: Self-pay

## 2023-12-11 DIAGNOSIS — Z483 Aftercare following surgery for neoplasm: Secondary | ICD-10-CM

## 2023-12-11 DIAGNOSIS — M25612 Stiffness of left shoulder, not elsewhere classified: Secondary | ICD-10-CM

## 2023-12-11 DIAGNOSIS — I972 Postmastectomy lymphedema syndrome: Secondary | ICD-10-CM

## 2023-12-11 NOTE — Therapy (Signed)
 OUTPATIENT PHYSICAL THERAPY  UPPER EXTREMITY ONCOLOGY EVALUATION  Patient Name: [name] MRN: [id number] DOB:November 26, 1975, 48 y.o., female Today's Date: 12/11/2023  END OF SESSION:  PT End of Session - 12/11/23 1200     Visit Number 2    Number of Visits 12    Date for PT Re-Evaluation 01/18/24    PT Start Time 1201    PT Stop Time 1300    PT Time Calculation (min) 59 min    Activity Tolerance Patient tolerated treatment well    Behavior During Therapy [facility] for tasks assessed/performed             Past Medical History:  Diagnosis Date   Allergy    Asthma    Back pain    Breast cancer in female [facility])    Left   Breast disorder    breast cancer May 2021   Chronic back pain    Fatty liver    Headache    High triglycerides    History of breast cancer    History of COVID-19 04/20/2021   Hx of migraines    Morbid obesity (HCC)    Personal history of chemotherapy    Personal history of radiation therapy    SOB (shortness of breath)    Vitamin D deficiency    Past Surgical History:  Procedure Laterality Date   BREAST LUMPECTOMY     BREAST LUMPECTOMY WITH RADIOACTIVE SEED AND SENTINEL LYMPH NODE BIOPSY Left 07/15/2020   Procedure: LEFT BREAST LUMPECTOMY WITH RADIOACTIVE SEED AND LEFT AXILLARY SENTINEL LYMPH NODE BIOPSY, LEFT AXILLARY NODE RADIOACTIVE SEED GUIDED EXCISION, LEFT BREAST RADIOACTIVE SEED GUIDED EXCISION IM NODE;  Surgeon: Emelia Loron, MD;  Location: [facility] OR;  Service: General;  Laterality: Left;  PEC BLOCK   CESAREAN SECTION WITH BILATERAL TUBAL LIGATION Bilateral 07/23/2015   Procedure: CESAREAN SECTION WITH BILATERAL TUBAL LIGATION;  Surgeon: Reva Bores, MD;  Location: [facility];  Service: Obstetrics;  Laterality: Bilateral;   IR IMAGING GUIDED PORT INSERTION  02/11/2022   MASTECTOMY Left    MODIFIED MASTECTOMY Left 08/22/2022   Procedure: LEFT MODIFIED RADICAL MASTECTOMY, LEFT AXILLARY NODE DISSECTION, AND SKIN BIOPSY OF BACK;  Surgeon:  Emelia Loron, MD;  Location: [facility] OR[facility];  Service: General;  Laterality: Left;   PORT-A-CATH REMOVAL N/A 04/30/2021   Procedure: REMOVAL PORT-A-CATH;  Surgeon: Emelia Loron, MD;  Location: [facility];  Service: General;  Laterality: N/A;   PORTACATH PLACEMENT Right 02/11/2020   Procedure: INSERTION PORT-A-CATH WITH ULTRASOUND GUIDANCE;  Surgeon: Emelia Loron, MD;  Location: [facility];  Service: General;  Laterality: Right;   TUBAL LIGATION     Patient Active Problem List   Diagnosis Date Noted   Prediabetes 08/22/2023   Abnormal craving 08/22/2023   Pure hypercholesterolemia 08/22/2023   Has health insurance with inadequate coverage of health expenses 08/22/2023   Elevated blood sugar 08/08/2023   Metabolic dysfunction-associated steatotic liver disease (MASLD) 08/08/2023   Class 2 severe obesity with serious comorbidity and body mass index (BMI) of 35.0 to 35.9 in adult [facility]) 08/08/2023   Vitamin B12 deficiency 02/03/2023   Vitamin D deficiency 11/04/2022   Health care maintenance 09/30/2022   S/P left mastectomy 08/22/2022   Burning with urination 06/01/2022   Swelling of left upper extremity 05/18/2022   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) 04/01/2022   Encounter for dental examination 03/11/2022   Acute apical periodontitis 03/11/2022   Phobia of dental procedure 03/11/2022   Defective  dental restoration 03/11/2022   Accretions on teeth 03/11/2022   Chronic periodontitis 03/11/2022   Teeth missing 03/11/2022   Caries 03/11/2022   Generalized gingival recession 03/11/2022   Pain, dental 03/08/2022   Cancer-related pain 03/08/2022   Port-A-Cath in place 02/15/2022   Oral allergy syndrome, subsequent encounter 02/03/2022   Adverse effect of other drugs, medicaments and biological substances, subsequent encounter 02/02/2022   Other adverse food reactions, not elsewhere classified, subsequent encounter 02/02/2022   Chest tightness  02/02/2022   Other allergic rhinitis 02/02/2022   Allergic conjunctivitis of both eyes 02/02/2022   Language barrier 12/11/2020   Morbid obesity (HCC)    History of breast cancer    Genetic testing 02/13/2020   Malignant neoplasm of upper-outer quadrant of left breast in female, estrogen receptor positive (HCC) 01/27/2020   Impaired ability to use community resources due to language barrier 07/13/2013    PCP:   REFERRING PROVIDER: Emelia Loron, MD  REFERRING DIAG: Lymphedema  THERAPY DIAG:  Postmastectomy lymphedema  Stiffness of left shoulder, not elsewhere classified  Aftercare following surgery for neoplasm  ONSET DATE:11 /20/2023 with recent exacerbation  Rationale for Evaluation and Treatment: Rehabilitation  SUBJECTIVE:                                                                                                                                                                                           SUBJECTIVE STATEMENT: Started with video interpreter  Dewayne Hatch [phone] and then Sue Lush from interpreter services came a few minutes late. No pain but more of  a discomfort across her chest. My arm feels OK right now, but a little heavy. I forgot my sleeve today. I have worn the night garment and my arm does feel better. She has tried doing the MLD at home.  EVAL  She sees and feels the arm more swollen, the chest and her back. She wants to have it checked to  be sure . The arm feels heavy and she has mild pain in Her entire left arm. It feels different that her normal pain, like its in the bone. Her sleeve is about 29 months old now. She has a night garment, but she doesn't wear it every night because her arm feels trapped. She gets tired of it because it is annoying. Sometimes she doesn' wear the glove when doing housework, dishes etc. I still feel pulling in my armpit with reaching.  PERTINENT HISTORY:  Patient was diagnosed on 01/23/2020 with left grade III invasive ductal  carcinoma breast cancer. It is ER positive, PR negative, and HER2 negative , but functionally Triple Negative,with a Ki67 of 40%. Patient  with interpreter present. She underwent neoadjuvant chemotherapy from 02/12/2020 - 05/28/2020. 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 She underwent a left lumpectomy and sentinel node biopsy ( 4 negative nodes) on 07/15/2020. She underwent radiation and anti-estrogen therapy.  She had interval development of diffuse skin thickening in 2023. She had a punch biopsy of her skin at that time and this was consistent with inflammatory breast cancer. She has been treated with chemo since then. She had a left modified radical mastectomy on 08/22/2022 with complete therapeutic response and 0/1 LN. She noted some swelling in her left arm and on 08/25/2022 she had an [place] that was negative for DVT. Her SOZO screen on 09/19/2021 was 6.9 points above baseline putting her in the yellow zone and she was given a compression sleeve that was not containing  her so a flat knit sleeve and glove and nighttime garment  have been ordered   PAIN:  Are you having pain? Not presently NPRS scale: 0/10 Pain location: left arm Pain orientation: Left  PAIN TYPE: aching Pain description: intermittent  Aggravating factors: can't sleep on left side Relieving factors: laying down flat  PRECAUTIONS: lymphedema [name]   RED FLAGS: Bowel or bladder incontinence: No   WEIGHT BEARING RESTRICTIONS: No  FALLS:  Has patient fallen in last 6 months? No  LIVING ENVIRONMENT: Lives with: lives with their family and lives with their daughters, 31, 30, 30 Lives in: House/apartment Stairs: No;  Has following equipment at home: None  OCCUPATION: No  LEISURE: walking,  HAND DOMINANCE: right   PRIOR LEVEL OF FUNCTION: Independent  PATIENT GOALS: To see if her arm is getting bigger, she can't reach her back to massage.   OBJECTIVE: Note: Objective measures were completed at  Evaluation unless otherwise noted.  COGNITION: Overall cognitive status: Within functional limits for tasks assessed   PALPATION:   OBSERVATIONS / OTHER ASSESSMENTS: increased swelling at left chest/axilla and left lateral trunk and back, No pitting edema in arm  SENSATION: Light touch: Deficits     POSTURE: Forward head, rounded shoulders UPPER EXTREMITY AROM/PROM:  A/PROM RIGHT   eval   Shoulder extension   Shoulder flexion 155  Shoulder abduction 175  Shoulder internal rotation   Shoulder external rotation     (Blank rows = not tested)  A/PROM LEFT   eval Left 12/11/2023  Shoulder extension    Shoulder flexion 149 155  Shoulder abduction 150 165  Shoulder internal rotation    Shoulder external rotation      (Blank rows = not tested)  CERVICAL AROM: All within normal limits:      UPPER EXTREMITY STRENGTH:   SURGERY TYPE/DATE: Left Lumpectomy with SLNB11/11/2019, Left modified mastectomy with left axillary node dissection 08/22/2022 NUMBER OF LYMPH NODES REMOVED: 0/4 with lumpectomy, 0/1 with Mastectomy   CHEMOTHERAPY: YES  RADIATION:YES  HORMONE TREATMENT:   INFECTIONS: IBC   LYMPHEDEMA ASSESSMENTS:   LANDMARK RIGHT  eval  At axilla  39.0  15 cm proximal to olecranon process   10 cm proximal to olecranon process 34.3  Olecranon process 28.2  15 cm proximal to ulnar styloid process 28.4  10 cm proximal to ulnar styloid process 26.9  Just proximal to ulnar styloid process 18.8  Across hand at thumb web space 20.5  At base of 2nd digit 6.35  (Blank rows = not tested)  LANDMARK LEFT  eval  At axilla  40.4  15 cm proximal to olecranon process   10 cm  proximal to olecranon process 34.5  Olecranon process 28.2  15 cm proximal to ulnar styloid process 28.1  10 cm proximal to ulnar styloid process 26.5  Just proximal to ulnar styloid process 18.8  Across hand at thumb web space 20.25  At base of 2nd digit 6.25  (Blank rows = not  tested)   FUNCTIONAL TESTS:    GAIT: WNL   L-DEX LYMPHEDEMA SCREENING: The patient was assessed using the L-Dex machine today to produce a lymphedema index baseline score. The patient will be reassessed on a regular basis (typically every 3 months) to obtain new L-Dex scores. If the score is > 6.5 points away from his/her baseline score indicating onset of subclinical lymphedema, it will be recommended to wear a compression garment for 4 weeks, 12 hours per day and then be reassessed. If the score continues to be > 6.5 points from baseline at reassessment, we will initiate lymphedema treatment. Assessing in this manner has a 95% rate of preventing clinically significant lymphedema.  QUICK DASH SURVEY:   Lymphedema Life Impact Scale; 44%                                                                                                                            TREATMENT DATE:   12/11/2023 Overhead pulleys 2 min flexion and abduction In supine: Short neck, superficial and deep abdominals, Activated R axillary nodes and left axillary LN's and establishment of interaxillary pathway, L inguinal nodes and establishment of axilloinguinal pathway, then L chest and UE working proximal to distal, moving fluid from upper inner arm outwards, and doing both sides of forearm moving fluid towards pathways spending extra time in any areas of fibrosis then retracing all steps. Also performed axillo inguinal pathway in S-L and worked on swelling at upper back in SL across posterior interaxillary anastomosis. Had pt practice neck, anterior interaxillary pathway. And UE with VC's and TC's for proper pressure and to slow down. Lengthy discussion with pt via interpreter about importance of wearing sleeve while performing household chores as that is when she is more likely to swell. It is OK to wear night garment in the daytime but hshe would take it off to do her work and then put it on, and later when doing more  chores would remove it again. Discussed wearing daytime sleeve and glove, and wearing food service type glove to cover her glove or remove glove temporarily when preparing food and then put back on. Pt verbalized understanding. Pt is also agreeable to let me send demographics and treatment note to [facility] cares to look into compression pump for her.  12/07/2023 Pt with questions about lymphedema and if her arm will continue to get bigger. Explained management of lymphedema and why it is important and went over measurements with her. Explained that her arm is doing quite well, but she does require new garments as they are nearly 58 months old. We will also try to get her a  Compression pump from [facility]. We will start with MLD to the chest, trunk and arm and review with her.. Encourage night garment as much as possible.She will also benefit from some stretches for her left shoulder due to tightness in the chest and axilla.   PATIENT EDUCATION:  Education details: lymphedema, need for new garments, will put in for compression pump again Person educated: Patient, interpreter Education method: Explanation Education comprehension: verbalized understanding  HOME EXERCISE PROGRAM: None given today  ASSESSMENT:  CLINICAL IMPRESSION: Pt came in without her sleeve on today, but overall she was feeling better today with discomfort only. Therapist performed MLD on pt and then had her demonstrate several different areas. She did well with pressure, but needed cues for stretch and to slow down. Good improvement in shoulder ROM today.  OBJECTIVE IMPAIRMENTS: decreased activity tolerance, decreased knowledge of condition, decreased ROM, increased edema, impaired flexibility, impaired UE functional use, postural dysfunction, and pain.   ACTIVITY LIMITATIONS: carrying, sleeping, and reach over head  PARTICIPATION LIMITATIONS:  pt is doing what is required of her with some restrictions with activities  requiring reaching overhead.  PERSONAL FACTORS: 3+ comorbidities: Left breast cancer s/p 2 surgeries,chemo and radiation  are also affecting patient's functional outcome.   REHAB POTENTIAL: Good  CLINICAL DECISION MAKING: Stable/uncomplicated  EVALUATION COMPLEXITY: Low  GOALS: Goals reviewed with patient? Yes  SHORT TERM GOALS: Target date: 12/28/2023  Pt will be independent in MLD to the left UE, chest, lateral trunk Baseline: Goal status: INITIAL  2.  Pt will be independent in HEP to improve left shoulder ROM/strength Baseline:  Goal status: INITIAL  3.  Pt will note decreased left UE achiness by 25% Baseline:  Goal status: INITIAL  4.  Pt will be compliant with compression bra to reduce trunk edema Baseline:  Goal status: INITIAL    LONG TERM GOALS: Target date: 01/18/2024  Pt will be approved for a sequential compression pump to decrease left trunk/arm lymphedema Baseline:  Goal status: INITIAL  2.  Pt will have achiness in eft arm reduced by 50% or greater Baseline:  Goal status: INITIAL  3.  Pt will have left shoulder abd atleast 165 degrees for improved reaching Baseline:  Goal status: INITIAL  4.  Pt will note decreased chest and trunk edema by 50% Baseline:  Goal status: INITIAL  5.  Pt will have new, appropriate compression garments to maintain left UE edema Baseline:  Goal status: INITIAL   PLAN:  PT FREQUENCY: 2x/week  PT DURATION: 6 weeks  PLANNED INTERVENTIONS: 97164- PT Re-evaluation, 97110-Therapeutic exercises, 97530- Therapeutic activity, 97112- Neuromuscular re-education, 97535- Self Care, 16109- Manual therapy, 97760- Orthotic Fit/training, and Patient/Family education  PLAN FOR NEXT SESSION: shoulder wall climbs for abduction or pulleys, PROM prn, Initiate MLD to left UE, lateral trunk, back and start instructing pt, (pt to be measured for new garments on Wed),Measure periodically, Demographics/note sent to [facility], PT 12/11/2023, 1:11 PM

## 2023-12-13 ENCOUNTER — Ambulatory Visit: Attending: General Surgery

## 2023-12-13 DIAGNOSIS — Z483 Aftercare following surgery for neoplasm: Secondary | ICD-10-CM | POA: Insufficient documentation

## 2023-12-13 DIAGNOSIS — C50412 Malignant neoplasm of upper-outer quadrant of left female breast: Secondary | ICD-10-CM | POA: Insufficient documentation

## 2023-12-13 DIAGNOSIS — I972 Postmastectomy lymphedema syndrome: Secondary | ICD-10-CM | POA: Insufficient documentation

## 2023-12-13 DIAGNOSIS — Z170 Estrogen receptor positive status [ER+]: Secondary | ICD-10-CM | POA: Insufficient documentation

## 2023-12-13 DIAGNOSIS — M6281 Muscle weakness (generalized): Secondary | ICD-10-CM | POA: Insufficient documentation

## 2023-12-13 DIAGNOSIS — M546 Pain in thoracic spine: Secondary | ICD-10-CM | POA: Insufficient documentation

## 2023-12-13 DIAGNOSIS — R293 Abnormal posture: Secondary | ICD-10-CM | POA: Insufficient documentation

## 2023-12-13 DIAGNOSIS — M25612 Stiffness of left shoulder, not elsewhere classified: Secondary | ICD-10-CM | POA: Insufficient documentation

## 2023-12-13 NOTE — Therapy (Signed)
 OUTPATIENT PHYSICAL THERAPY  UPPER EXTREMITY ONCOLOGY EVALUATION  Patient Name: [name] MRN: [id number] DOB:11-13-1975, 48 y.o., female Today's Date: 12/13/2023  END OF SESSION:  PT End of Session - 12/13/23 1217     Visit Number 3    Number of Visits 12    Date for PT Re-Evaluation 01/18/24    PT Start Time 1218    PT Stop Time 1300    PT Time Calculation (min) 42 min    Activity Tolerance Patient tolerated treatment well    Behavior During Therapy [name] for tasks assessed/performed             Past Medical History:  Diagnosis Date   Allergy    Asthma    Back pain    Breast cancer in female [facility])    Left   Breast disorder    breast cancer May 2021   Chronic back pain    Fatty liver    Headache    High triglycerides    History of breast cancer    History of COVID-19 04/20/2021   Hx of migraines    Morbid obesity (HCC)    Personal history of chemotherapy    Personal history of radiation therapy    SOB (shortness of breath)    Vitamin D deficiency    Past Surgical History:  Procedure Laterality Date   BREAST LUMPECTOMY     BREAST LUMPECTOMY WITH RADIOACTIVE SEED AND SENTINEL LYMPH NODE BIOPSY Left 07/15/2020   Procedure: LEFT BREAST LUMPECTOMY WITH RADIOACTIVE SEED AND LEFT AXILLARY SENTINEL LYMPH NODE BIOPSY, LEFT AXILLARY NODE RADIOACTIVE SEED GUIDED EXCISION, LEFT BREAST RADIOACTIVE SEED GUIDED EXCISION IM NODE;  Surgeon: Emelia Loron, MD;  Location: [facility] OR;  Service: General;  Laterality: Left;  PEC BLOCK   CESAREAN SECTION WITH BILATERAL TUBAL LIGATION Bilateral 07/23/2015   Procedure: CESAREAN SECTION WITH BILATERAL TUBAL LIGATION;  Surgeon: Reva Bores, MD;  Location: [facility]S;  Service: Obstetrics;  Laterality: Bilateral;   IR IMAGING GUIDED PORT INSERTION  02/11/2022   MASTECTOMY Left    MODIFIED MASTECTOMY Left 08/22/2022   Procedure: LEFT MODIFIED RADICAL MASTECTOMY, LEFT AXILLARY NODE DISSECTION, AND SKIN BIOPSY OF BACK;  Surgeon:  Emelia Loron, MD;  Location: [facility] ORS;  Service: General;  Laterality: Left;   PORT-A-CATH REMOVAL N/A 04/30/2021   Procedure: REMOVAL PORT-A-CATH;  Surgeon: Emelia Loron, MD;  Location: [facility];  Service: General;  Laterality: N/A;   PORTACATH PLACEMENT Right 02/11/2020   Procedure: INSERTION PORT-A-CATH WITH ULTRASOUND GUIDANCE;  Surgeon: Emelia Loron, MD;  Location: [facility];  Service: General;  Laterality: Right;   TUBAL LIGATION     Patient Active Problem List   Diagnosis Date Noted   Prediabetes 08/22/2023   Abnormal craving 08/22/2023   Pure hypercholesterolemia 08/22/2023   Has health insurance with inadequate coverage of health expenses 08/22/2023   Elevated blood sugar 08/08/2023   Metabolic dysfunction-associated steatotic liver disease (MASLD) 08/08/2023   Class 2 severe obesity with serious comorbidity and body mass index (BMI) of 35.0 to 35.9 in adult [facility]) 08/08/2023   Vitamin B12 deficiency 02/03/2023   Vitamin D deficiency 11/04/2022   Health care maintenance 09/30/2022   S/P left mastectomy 08/22/2022   Burning with urination 06/01/2022   Swelling of left upper extremity 05/18/2022   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) 04/01/2022   Encounter for dental examination 03/11/2022   Acute apical periodontitis 03/11/2022   Phobia of dental procedure 03/11/2022   Defective  dental restoration 03/11/2022   Accretions on teeth 03/11/2022   Chronic periodontitis 03/11/2022   Teeth missing 03/11/2022   Caries 03/11/2022   Generalized gingival recession 03/11/2022   Pain, dental 03/08/2022   Cancer-related pain 03/08/2022   Port-A-Cath in place 02/15/2022   Oral allergy syndrome, subsequent encounter 02/03/2022   Adverse effect of other drugs, medicaments and biological substances, subsequent encounter 02/02/2022   Other adverse food reactions, not elsewhere classified, subsequent encounter 02/02/2022   Chest tightness  02/02/2022   Other allergic rhinitis 02/02/2022   Allergic conjunctivitis of both eyes 02/02/2022   Language barrier 12/11/2020   Morbid obesity (HCC)    History of breast cancer    Genetic testing 02/13/2020   Malignant neoplasm of upper-outer quadrant of left breast in female, estrogen receptor positive (HCC) 01/27/2020   Impaired ability to use community resources due to language barrier 07/13/2013    PCP:   REFERRING PROVIDER: Emelia Loron, MD  REFERRING DIAG: Lymphedema  THERAPY DIAG:  Postmastectomy lymphedema  Stiffness of left shoulder, not elsewhere classified  Aftercare following surgery for neoplasm  Abnormal posture  Muscle weakness (generalized)  ONSET DATE:11 /20/2023 with recent exacerbation  Rationale for Evaluation and Treatment: Rehabilitation  SUBJECTIVE:                                                                                                                                                                                           SUBJECTIVE STATEMENT: Pt wearing her sleeve and glove and has started wearing her night garment. Her arm is feeling better overall. She does not feel like the swelling in her back has improved yet.  EVAL  She sees and feels the arm more swollen, the chest and her back. She wants to have it checked to  be sure . The arm feels heavy and she has mild pain in Her entire left arm. It feels different that her normal pain, like its in the bone. Her sleeve is about 64 months old now. She has a night garment, but she doesn't wear it every night because her arm feels trapped. She gets tired of it because it is annoying. Sometimes she doesn' wear the glove when doing housework, dishes etc. I still feel pulling in my armpit with reaching.  PERTINENT HISTORY:  Patient was diagnosed on 01/23/2020 with left grade III invasive ductal carcinoma breast cancer. It is ER positive, PR negative, and HER2 negative , but functionally Triple  Negative,with a Ki67 of 40%. Patient with interpreter present. She underwent neoadjuvant chemotherapy from 02/12/2020 - 05/28/2020. 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 She underwent a  left lumpectomy and sentinel node biopsy ( 4 negative nodes) on 07/15/2020. She underwent radiation and anti-estrogen therapy.  She had interval development of diffuse skin thickening in 2023. She had a punch biopsy of her skin at that time and this was consistent with inflammatory breast cancer. She has been treated with chemo since then. She had a left modified radical mastectomy on 08/22/2022 with complete therapeutic response and 0/1 LN. She noted some swelling in her left arm and on 08/25/2022 she had an [place] that was negative for DVT. Her SOZO screen on 09/19/2021 was 6.9 points above baseline putting her in the yellow zone and she was given a compression sleeve that was not containing  her so a flat knit sleeve and glove and nighttime garment  have been ordered   PAIN:  Are you having pain? Not presently NPRS scale: 0/10 Pain location: left arm Pain orientation: Left  PAIN TYPE: aching Pain description: intermittent  Aggravating factors: can't sleep on left side Relieving factors: laying down flat  PRECAUTIONS: lymphedema [name]   RED FLAGS: Bowel or bladder incontinence: No   WEIGHT BEARING RESTRICTIONS: No  FALLS:  Has patient fallen in last 6 months? No  LIVING ENVIRONMENT: Lives with: lives with their family and lives with their daughters, 44, 70, 49 Lives in: House/apartment Stairs: No;  Has following equipment at home: None  OCCUPATION: No  LEISURE: walking,  HAND DOMINANCE: right   PRIOR LEVEL OF FUNCTION: Independent  PATIENT GOALS: To see if her arm is getting bigger, she can't reach her back to massage.   OBJECTIVE: Note: Objective measures were completed at Evaluation unless otherwise noted.  COGNITION: Overall cognitive status: Within functional limits for tasks  assessed   PALPATION:   OBSERVATIONS / OTHER ASSESSMENTS: increased swelling at left chest/axilla and left lateral trunk and back, No pitting edema in arm  SENSATION: Light touch: Deficits     POSTURE: Forward head, rounded shoulders UPPER EXTREMITY AROM/PROM:  A/PROM RIGHT   eval   Shoulder extension   Shoulder flexion 155  Shoulder abduction 175  Shoulder internal rotation   Shoulder external rotation     (Blank rows = not tested)  A/PROM LEFT   eval Left 12/11/2023  Shoulder extension    Shoulder flexion 149 155  Shoulder abduction 150 165  Shoulder internal rotation    Shoulder external rotation      (Blank rows = not tested)  CERVICAL AROM: All within normal limits:      UPPER EXTREMITY STRENGTH:   SURGERY TYPE/DATE: Left Lumpectomy with SLNB11/11/2019, Left modified mastectomy with left axillary node dissection 08/22/2022 NUMBER OF LYMPH NODES REMOVED: 0/4 with lumpectomy, 0/1 with Mastectomy   CHEMOTHERAPY: YES  RADIATION:YES  HORMONE TREATMENT:   INFECTIONS: IBC   LYMPHEDEMA ASSESSMENTS:   LANDMARK RIGHT  eval  At axilla  39.0  15 cm proximal to olecranon process   10 cm proximal to olecranon process 34.3  Olecranon process 28.2  15 cm proximal to ulnar styloid process 28.4  10 cm proximal to ulnar styloid process 26.9  Just proximal to ulnar styloid process 18.8  Across hand at thumb web space 20.5  At base of 2nd digit 6.35  (Blank rows = not tested)  LANDMARK LEFT  eval  At axilla  40.4  15 cm proximal to olecranon process   10 cm proximal to olecranon process 34.5  Olecranon process 28.2  15 cm proximal to ulnar styloid process 28.1  10 cm proximal to ulnar styloid  process 26.5  Just proximal to ulnar styloid process 18.8  Across hand at thumb web space 20.25  At base of 2nd digit 6.25  (Blank rows = not tested)   FUNCTIONAL TESTS:    GAIT: WNL   L-DEX LYMPHEDEMA SCREENING: The patient was assessed using the L-Dex  machine today to produce a lymphedema index baseline score. The patient will be reassessed on a regular basis (typically every 3 months) to obtain new L-Dex scores. If the score is > 6.5 points away from his/her baseline score indicating onset of subclinical lymphedema, it will be recommended to wear a compression garment for 4 weeks, 12 hours per day and then be reassessed. If the score continues to be > 6.5 points from baseline at reassessment, we will initiate lymphedema treatment. Assessing in this manner has a 95% rate of preventing clinically significant lymphedema.  QUICK DASH SURVEY:   Lymphedema Life Impact Scale; 44%                                                                                                                            TREATMENT DATE:   12/13/2023 Pt late Discussion with pt and  interpreter, Alba about compression sleeve. Pt wants one that pulls up higher but explained there aren't any that pull up over the top of the shoulder. She is concerned that the top of her sleeve may be too tight. Being measured today by Rodman Pickle for new garments but explained I will need to determine how and where to order from with her insurance Also discussed compression pump;advised we are waiting for MD script before it can be ordered, but all other information has been sent and the hope is it will be approved .  In supine: Short neck, superficial and deep abdominals, Activated R axillary nodes and left axillary LN's and establishment of interaxillary pathway, L inguinal nodes and establishment of axilloinguinal pathway, then L chest and UE working proximal to distal, moving fluid from upper inner arm outwards, and doing both sides of forearm moving fluid towards pathways spending extra time in any areas of fibrosis then retracing all steps. Also performed axillo inguinal pathway in S-L and worked on swelling at upper back in SL across posterior interaxillary anastomosis.  12/11/2023 Overhead  pulleys 2 min flexion and abduction In supine: Short neck, superficial and deep abdominals, Activated R axillary nodes and left axillary LN's and establishment of interaxillary pathway, L inguinal nodes and establishment of axilloinguinal pathway, then L chest and UE working proximal to distal, moving fluid from upper inner arm outwards, and doing both sides of forearm moving fluid towards pathways spending extra time in any areas of fibrosis then retracing all steps. Also performed axillo inguinal pathway in S-L and worked on swelling at upper back in SL across posterior interaxillary anastomosis. Had pt practice neck, anterior interaxillary pathway. And UE with VC's and TC's for proper pressure and to slow down. Lengthy discussion with pt via interpreter about  importance of wearing sleeve while performing household chores as that is when she is more likely to swell. It is OK to wear night garment in the daytime but hshe would take it off to do her work and then put it on, and later when doing more chores would remove it again. Discussed wearing daytime sleeve and glove, and wearing food service type glove to cover her glove or remove glove temporarily when preparing food and then put back on. Pt verbalized understanding. Pt is also agreeable to let me send demographics and treatment note to [facility] cares to look into compression pump for her.  12/07/2023 Pt with questions about lymphedema and if her arm will continue to get bigger. Explained management of lymphedema and why it is important and went over measurements with her. Explained that her arm is doing quite well, but she does require new garments as they are nearly 69 months old. We will also try to get her a Compression pump from [facility]. We will start with MLD to the chest, trunk and arm and review with her.. Encourage night garment as much as possible.She will also benefit from some stretches for her left shoulder due to tightness in the chest and  axilla.   PATIENT EDUCATION:  Education details: lymphedema, need for new garments, will put in for compression pump again Person educated: Patient, interpreter Education method: Explanation Education comprehension: verbalized understanding  HOME EXERCISE PROGRAM: None given today  ASSESSMENT:  CLINICAL IMPRESSION: Pt came in without her sleeve on today, but overall she was feeling better today with discomfort only. Therapist performed MLD on pt and then had her demonstrate several different areas. She did well with pressure, but needed cues for stretch and to slow down. Good improvement in shoulder ROM today.  OBJECTIVE IMPAIRMENTS: decreased activity tolerance, decreased knowledge of condition, decreased ROM, increased edema, impaired flexibility, impaired UE functional use, postural dysfunction, and pain.   ACTIVITY LIMITATIONS: carrying, sleeping, and reach over head  PARTICIPATION LIMITATIONS:  pt is doing what is required of her with some restrictions with activities requiring reaching overhead.  PERSONAL FACTORS: 3+ comorbidities: Left breast cancer s/p 2 surgeries,chemo and radiation  are also affecting patient's functional outcome.   REHAB POTENTIAL: Good  CLINICAL DECISION MAKING: Stable/uncomplicated  EVALUATION COMPLEXITY: Low  GOALS: Goals reviewed with patient? Yes  SHORT TERM GOALS: Target date: 12/28/2023  Pt will be independent in MLD to the left UE, chest, lateral trunk Baseline: Goal status: INITIAL  2.  Pt will be independent in HEP to improve left shoulder ROM/strength Baseline:  Goal status: INITIAL  3.  Pt will note decreased left UE achiness by 25% Baseline:  Goal status: INITIAL  4.  Pt will be compliant with compression bra to reduce trunk edema Baseline:  Goal status: INITIAL    LONG TERM GOALS: Target date: 01/18/2024  Pt will be approved for a sequential compression pump to decrease left trunk/arm lymphedema Baseline:  Goal status:  INITIAL  2.  Pt will have achiness in eft arm reduced by 50% or greater Baseline:  Goal status: INITIAL  3.  Pt will have left shoulder abd atleast 165 degrees for improved reaching Baseline:  Goal status: INITIAL  4.  Pt will note decreased chest and trunk edema by 50% Baseline:  Goal status: INITIAL  5.  Pt will have new, appropriate compression garments to maintain left UE edema Baseline:  Goal status: INITIAL   PLAN:  PT FREQUENCY: 2x/week  PT DURATION: 6 weeks  PLANNED INTERVENTIONS: 97164- PT Re-evaluation, 97110-Therapeutic exercises, 97530- Therapeutic activity, 97112- Neuromuscular re-education, 97535- Self Care, 86578- Manual therapy, [phone]- Orthotic Fit/training, and Patient/Family education  PLAN FOR NEXT SESSION: shoulder wall climbs for abduction or pulleys, PROM prn, Initiate MLD to left UE, lateral trunk, back and start instructing pt, (pt to be measured for new garments on Wed),Measure periodically, Demographics/note sent to Bar[facility], order form sent to MD for signature(12/13/2023)  Waynette Buttery, PT 12/13/2023, 1:05 PM

## 2023-12-17 ENCOUNTER — Other Ambulatory Visit: Payer: Self-pay | Admitting: Nurse Practitioner

## 2023-12-18 ENCOUNTER — Encounter: Payer: Self-pay | Admitting: Physical Therapy

## 2023-12-18 ENCOUNTER — Ambulatory Visit: Admitting: Physical Therapy

## 2023-12-18 DIAGNOSIS — Z483 Aftercare following surgery for neoplasm: Secondary | ICD-10-CM

## 2023-12-18 DIAGNOSIS — M25612 Stiffness of left shoulder, not elsewhere classified: Secondary | ICD-10-CM

## 2023-12-18 DIAGNOSIS — I972 Postmastectomy lymphedema syndrome: Secondary | ICD-10-CM

## 2023-12-18 DIAGNOSIS — R293 Abnormal posture: Secondary | ICD-10-CM

## 2023-12-18 NOTE — Therapy (Signed)
 OUTPATIENT PHYSICAL THERAPY  UPPER EXTREMITY ONCOLOGY TREATMENT  Patient Name: [name] MRN: [id number] DOB:07-Apr-1976, 48 y.o., female Today's Date: 12/18/2023  END OF SESSION:  PT End of Session - 12/18/23 1024     Visit Number 4    Number of Visits 12    Date for PT Re-Evaluation 01/18/24    PT Start Time 0932    PT Stop Time 1022    PT Time Calculation (min) 50 min    Activity Tolerance Patient tolerated treatment well    Behavior During Therapy [facility] for tasks assessed/performed              Past Medical History:  Diagnosis Date   Allergy    Asthma    Back pain    Breast cancer in female [facility])    Left   Breast disorder    breast cancer May 2021   Chronic back pain    Fatty liver    Headache    High triglycerides    History of breast cancer    History of COVID-19 04/20/2021   Hx of migraines    Morbid obesity (HCC)    Personal history of chemotherapy    Personal history of radiation therapy    SOB (shortness of breath)    Vitamin D deficiency    Past Surgical History:  Procedure Laterality Date   BREAST LUMPECTOMY     BREAST LUMPECTOMY WITH RADIOACTIVE SEED AND SENTINEL LYMPH NODE BIOPSY Left 07/15/2020   Procedure: LEFT BREAST LUMPECTOMY WITH RADIOACTIVE SEED AND LEFT AXILLARY SENTINEL LYMPH NODE BIOPSY, LEFT AXILLARY NODE RADIOACTIVE SEED GUIDED EXCISION, LEFT BREAST RADIOACTIVE SEED GUIDED EXCISION IM NODE;  Surgeon: Emelia Loron, MD;  Location: [facility] OR;  Service: General;  Laterality: Left;  PEC BLOCK   CESAREAN SECTION WITH BILATERAL TUBAL LIGATION Bilateral 07/23/2015   Procedure: CESAREAN SECTION WITH BILATERAL TUBAL LIGATION;  Surgeon: Reva Bores, MD;  Location: [facility];  Service: Obstetrics;  Laterality: Bilateral;   IR IMAGING GUIDED PORT INSERTION  02/11/2022   MASTECTOMY Left    MODIFIED MASTECTOMY Left 08/22/2022   Procedure: LEFT MODIFIED RADICAL MASTECTOMY, LEFT AXILLARY NODE DISSECTION, AND SKIN BIOPSY OF BACK;  Surgeon:  Emelia Loron, MD;  Location: [facility] OR[facility];  Service: General;  Laterality: Left;   PORT-A-CATH REMOVAL N/A 04/30/2021   Procedure: REMOVAL PORT-A-CATH;  Surgeon: Emelia Loron, MD;  Location: [facility];  Service: General;  Laterality: N/A;   PORTACATH PLACEMENT Right 02/11/2020   Procedure: INSERTION PORT-A-CATH WITH ULTRASOUND GUIDANCE;  Surgeon: Emelia Loron, MD;  Location: [facility];  Service: General;  Laterality: Right;   TUBAL LIGATION     Patient Active Problem List   Diagnosis Date Noted   Prediabetes 08/22/2023   Abnormal craving 08/22/2023   Pure hypercholesterolemia 08/22/2023   Has health insurance with inadequate coverage of health expenses 08/22/2023   Elevated blood sugar 08/08/2023   Metabolic dysfunction-associated steatotic liver disease (MASLD) 08/08/2023   Class 2 severe obesity with serious comorbidity and body mass index (BMI) of 35.0 to 35.9 in adult [facility]) 08/08/2023   Vitamin B12 deficiency 02/03/2023   Vitamin D deficiency 11/04/2022   Health care maintenance 09/30/2022   S/P left mastectomy 08/22/2022   Burning with urination 06/01/2022   Swelling of left upper extremity 05/18/2022   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) 04/01/2022   Encounter for dental examination 03/11/2022   Acute apical periodontitis 03/11/2022   Phobia of dental procedure 03/11/2022  Defective dental restoration 03/11/2022   Accretions on teeth 03/11/2022   Chronic periodontitis 03/11/2022   Teeth missing 03/11/2022   Caries 03/11/2022   Generalized gingival recession 03/11/2022   Pain, dental 03/08/2022   Cancer-related pain 03/08/2022   Port-A-Cath in place 02/15/2022   Oral allergy syndrome, subsequent encounter 02/03/2022   Adverse effect of other drugs, medicaments and biological substances, subsequent encounter 02/02/2022   Other adverse food reactions, not elsewhere classified, subsequent encounter 02/02/2022   Chest tightness  02/02/2022   Other allergic rhinitis 02/02/2022   Allergic conjunctivitis of both eyes 02/02/2022   Language barrier 12/11/2020   Morbid obesity (HCC)    History of breast cancer    Genetic testing 02/13/2020   Malignant neoplasm of upper-outer quadrant of left breast in female, estrogen receptor positive (HCC) 01/27/2020   Impaired ability to use community resources due to language barrier 07/13/2013    PCP:   REFERRING PROVIDER: Emelia Loron, MD  REFERRING DIAG: Lymphedema  THERAPY DIAG:  Postmastectomy lymphedema  Stiffness of left shoulder, not elsewhere classified  Aftercare following surgery for neoplasm  Abnormal posture  ONSET DATE:11 /20/2023 with recent exacerbation  Rationale for Evaluation and Treatment: Rehabilitation  SUBJECTIVE:                                                                                                                                                                                           SUBJECTIVE STATEMENT: I don't feel like my sleeve comes up high enough.   EVAL  She sees and feels the arm more swollen, the chest and her back. She wants to have it checked to  be sure . The arm feels heavy and she has mild pain in Her entire left arm. It feels different that her normal pain, like its in the bone. Her sleeve is about 86 months old now. She has a night garment, but she doesn't wear it every night because her arm feels trapped. She gets tired of it because it is annoying. Sometimes she doesn' wear the glove when doing housework, dishes etc. I still feel pulling in my armpit with reaching.  PERTINENT HISTORY:  Patient was diagnosed on 01/23/2020 with left grade III invasive ductal carcinoma breast cancer. It is ER positive, PR negative, and HER2 negative , but functionally Triple Negative,with a Ki67 of 40%. Patient with interpreter present. She underwent neoadjuvant chemotherapy from 02/12/2020 - 05/28/2020. She had to stop after 9 cycles  due to peripheral neuropathy. She underwent a left lumpectomy and sentinel node biopsy ( 4 negative nodes) on 07/15/2020. She underwent radiation and anti-estrogen therapy.  She had interval development of  diffuse skin thickening in 2023. She had a punch biopsy of her skin at that time and this was consistent with inflammatory breast cancer. She has been treated with chemo since then. She had a left modified radical mastectomy on 08/22/2022 with complete therapeutic response and 0/1 LN. She noted some swelling in her left arm and on 08/25/2022 she had an [place] that was negative for DVT. Her SOZO screen on 09/19/2021 was 6.9 points above baseline putting her in the yellow zone and she was given a compression sleeve that was not containing  her so a flat knit sleeve and glove and nighttime garment  have been ordered   PAIN:  Are you having pain? Not presently NPRS scale: 0/10 Pain location: left arm Pain orientation: Left  PAIN TYPE: aching Pain description: intermittent  Aggravating factors: can't sleep on left side Relieving factors: laying down flat  PRECAUTIONS: lymphedema [name]   RED FLAGS: Bowel or bladder incontinence: No   WEIGHT BEARING RESTRICTIONS: No  FALLS:  Has patient fallen in last 6 months? No  LIVING ENVIRONMENT: Lives with: lives with their family and lives with their daughters, 41, 52, 76 Lives in: House/apartment Stairs: No;  Has following equipment at home: None  OCCUPATION: No  LEISURE: walking,  HAND DOMINANCE: right   PRIOR LEVEL OF FUNCTION: Independent  PATIENT GOALS: To see if her arm is getting bigger, she can't reach her back to massage.   OBJECTIVE: Note: Objective measures were completed at Evaluation unless otherwise noted.  COGNITION: Overall cognitive status: Within functional limits for tasks assessed   PALPATION:   OBSERVATIONS / OTHER ASSESSMENTS: increased swelling at left chest/axilla and left lateral trunk and back, No pitting edema in  arm  SENSATION: Light touch: Deficits     POSTURE: Forward head, rounded shoulders UPPER EXTREMITY AROM/PROM:  A/PROM RIGHT   eval   Shoulder extension   Shoulder flexion 155  Shoulder abduction 175  Shoulder internal rotation   Shoulder external rotation     (Blank rows = not tested)  A/PROM LEFT   eval Left 12/11/2023  Shoulder extension    Shoulder flexion 149 155  Shoulder abduction 150 165  Shoulder internal rotation    Shoulder external rotation      (Blank rows = not tested)  CERVICAL AROM: All within normal limits:      UPPER EXTREMITY STRENGTH:   SURGERY TYPE/DATE: Left Lumpectomy with SLNB11/11/2019, Left modified mastectomy with left axillary node dissection 08/22/2022 NUMBER OF LYMPH NODES REMOVED: 0/4 with lumpectomy, 0/1 with Mastectomy   CHEMOTHERAPY: YES  RADIATION:YES  HORMONE TREATMENT:   INFECTIONS: IBC   LYMPHEDEMA ASSESSMENTS:   LANDMARK RIGHT  eval  At axilla  39.0  15 cm proximal to olecranon process   10 cm proximal to olecranon process 34.3  Olecranon process 28.2  15 cm proximal to ulnar styloid process 28.4  10 cm proximal to ulnar styloid process 26.9  Just proximal to ulnar styloid process 18.8  Across hand at thumb web space 20.5  At base of 2nd digit 6.35  (Blank rows = not tested)  LANDMARK LEFT  eval  At axilla  40.4  15 cm proximal to olecranon process   10 cm proximal to olecranon process 34.5  Olecranon process 28.2  15 cm proximal to ulnar styloid process 28.1  10 cm proximal to ulnar styloid process 26.5  Just proximal to ulnar styloid process 18.8  Across hand at thumb web space 20.25  At base of 2nd digit  6.25  (Blank rows = not tested)   FUNCTIONAL TESTS:    GAIT: WNL   L-DEX LYMPHEDEMA SCREENING: The patient was assessed using the L-Dex machine today to produce a lymphedema index baseline score. The patient will be reassessed on a regular basis (typically every 3 months) to obtain new L-Dex  scores. If the score is > 6.5 points away from his/her baseline score indicating onset of subclinical lymphedema, it will be recommended to wear a compression garment for 4 weeks, 12 hours per day and then be reassessed. If the score continues to be > 6.5 points from baseline at reassessment, we will initiate lymphedema treatment. Assessing in this manner has a 95% rate of preventing clinically significant lymphedema.  QUICK DASH SURVEY:   Lymphedema Life Impact Scale; 44%                                                                                                                            TREATMENT DATE:  12/18/23: Manual lymph drainage in supine as follows: short neck, 5 diaphragmatic breaths,  right axillary nodes, left inguinal nodes; anterior inter-axillary anastamoses, left axillo-inguinal anastomoses then left upper extremity from fingers and dorsal hand to lateral shoulder working proximal to distal then retracing steps redirecting along pathways. Then to R sidelying to L lateral trunk moving fluid along axillo inguinal anastomosis and posterior inter axillary anastomosis and retraced pathways in supine ending with R axillary nodes and L inguinal nodes.   12/13/2023 Pt late Discussion with pt and  interpreter, Alba about compression sleeve. Pt wants one that pulls up higher but explained there aren't any that pull up over the top of the shoulder. She is concerned that the top of her sleeve may be too tight. Being measured today by Rodman Pickle for new garments but explained I will need to determine how and where to order from with her insurance Also discussed compression pump;advised we are waiting for MD script before it can be ordered, but all other information has been sent and the hope is it will be approved .  In supine: Short neck, superficial and deep abdominals, Activated R axillary nodes and left axillary LN's and establishment of interaxillary pathway, L inguinal nodes and establishment  of axilloinguinal pathway, then L chest and UE working proximal to distal, moving fluid from upper inner arm outwards, and doing both sides of forearm moving fluid towards pathways spending extra time in any areas of fibrosis then retracing all steps. Also performed axillo inguinal pathway in S-L and worked on swelling at upper back in SL across posterior interaxillary anastomosis.  12/11/2023 Overhead pulleys 2 min flexion and abduction In supine: Short neck, superficial and deep abdominals, Activated R axillary nodes and left axillary LN's and establishment of interaxillary pathway, L inguinal nodes and establishment of axilloinguinal pathway, then L chest and UE working proximal to distal, moving fluid from upper inner arm outwards, and doing both sides of forearm moving fluid towards pathways spending  extra time in any areas of fibrosis then retracing all steps. Also performed axillo inguinal pathway in S-L and worked on swelling at upper back in SL across posterior interaxillary anastomosis. Had pt practice neck, anterior interaxillary pathway. And UE with VC's and TC's for proper pressure and to slow down. Lengthy discussion with pt via interpreter about importance of wearing sleeve while performing household chores as that is when she is more likely to swell. It is OK to wear night garment in the daytime but hshe would take it off to do her work and then put it on, and later when doing more chores would remove it again. Discussed wearing daytime sleeve and glove, and wearing food service type glove to cover her glove or remove glove temporarily when preparing food and then put back on. Pt verbalized understanding. Pt is also agreeable to let me send demographics and treatment note to [facility] cares to look into compression pump for her.  12/07/2023 Pt with questions about lymphedema and if her arm will continue to get bigger. Explained management of lymphedema and why it is important and went over  measurements with her. Explained that her arm is doing quite well, but she does require new garments as they are nearly 50 months old. We will also try to get her a Compression pump from [facility]. We will start with MLD to the chest, trunk and arm and review with her.. Encourage night garment as much as possible.She will also benefit from some stretches for her left shoulder due to tightness in the chest and axilla.   PATIENT EDUCATION:  Education details: lymphedema, need for new garments, will put in for compression pump again Person educated: Patient, interpreter Education method: Explanation Education comprehension: verbalized understanding  HOME EXERCISE PROGRAM: None given today  ASSESSMENT:  CLINICAL IMPRESSION: Discussed fit of current compression sleeve. It fit well and did come up high enough. She only has one and it is over 69 months old. She would benefit from another. Continued with MLD and spend extra time at left lateral trunk where there is increased lymphedema.   OBJECTIVE IMPAIRMENTS: decreased activity tolerance, decreased knowledge of condition, decreased ROM, increased edema, impaired flexibility, impaired UE functional use, postural dysfunction, and pain.   ACTIVITY LIMITATIONS: carrying, sleeping, and reach over head  PARTICIPATION LIMITATIONS:  pt is doing what is required of her with some restrictions with activities requiring reaching overhead.  PERSONAL FACTORS: 3+ comorbidities: Left breast cancer s/p 2 surgeries,chemo and radiation  are also affecting patient's functional outcome.   REHAB POTENTIAL: Good  CLINICAL DECISION MAKING: Stable/uncomplicated  EVALUATION COMPLEXITY: Low  GOALS: Goals reviewed with patient? Yes  SHORT TERM GOALS: Target date: 12/28/2023  Pt will be independent in MLD to the left UE, chest, lateral trunk Baseline: Goal status: INITIAL  2.  Pt will be independent in HEP to improve left shoulder ROM/strength Baseline:  Goal  status: INITIAL  3.  Pt will note decreased left UE achiness by 25% Baseline:  Goal status: INITIAL  4.  Pt will be compliant with compression bra to reduce trunk edema Baseline:  Goal status: INITIAL    LONG TERM GOALS: Target date: 01/18/2024  Pt will be approved for a sequential compression pump to decrease left trunk/arm lymphedema Baseline:  Goal status: INITIAL  2.  Pt will have achiness in eft arm reduced by 50% or greater Baseline:  Goal status: INITIAL  3.  Pt will have left shoulder abd atleast 165 degrees for improved reaching  Baseline:  Goal status: INITIAL  4.  Pt will note decreased chest and trunk edema by 50% Baseline:  Goal status: INITIAL  5.  Pt will have new, appropriate compression garments to maintain left UE edema Baseline:  Goal status: INITIAL   PLAN:  PT FREQUENCY: 2x/week  PT DURATION: 6 weeks  PLANNED INTERVENTIONS: 97164- PT Re-evaluation, 97110-Therapeutic exercises, 97530- Therapeutic activity, 97112- Neuromuscular re-education, 97535- Self Care, 40102- Manual therapy, 97760- Orthotic Fit/training, and Patient/Family education  PLAN FOR NEXT SESSION: shoulder wall climbs for abduction or pulleys, PROM prn, Initiate MLD to left UE, lateral trunk, back and start instructing pt, (pt to be measured for new garments on Wed),Measure periodically, Demographics/note sent to Barron Alvine, order form sent to MD for signature(12/13/2023)  Leonette Most, PT 12/18/2023, 10:27 AM

## 2023-12-20 ENCOUNTER — Ambulatory Visit: Admitting: Physical Therapy

## 2023-12-20 ENCOUNTER — Encounter: Payer: Self-pay | Admitting: Physical Therapy

## 2023-12-20 DIAGNOSIS — R293 Abnormal posture: Secondary | ICD-10-CM

## 2023-12-20 DIAGNOSIS — M6281 Muscle weakness (generalized): Secondary | ICD-10-CM

## 2023-12-20 DIAGNOSIS — Z483 Aftercare following surgery for neoplasm: Secondary | ICD-10-CM

## 2023-12-20 DIAGNOSIS — M546 Pain in thoracic spine: Secondary | ICD-10-CM

## 2023-12-20 DIAGNOSIS — C50412 Malignant neoplasm of upper-outer quadrant of left female breast: Secondary | ICD-10-CM

## 2023-12-20 DIAGNOSIS — I972 Postmastectomy lymphedema syndrome: Secondary | ICD-10-CM

## 2023-12-20 DIAGNOSIS — M25612 Stiffness of left shoulder, not elsewhere classified: Secondary | ICD-10-CM

## 2023-12-20 NOTE — Therapy (Signed)
 OUTPATIENT PHYSICAL THERAPY  UPPER EXTREMITY ONCOLOGY TREATMENT  Patient Name: [name] MRN: [id number] DOB:30-Sep-1975, 48 y.o., female Today's Date: 12/20/2023  END OF SESSION:  PT End of Session - 12/20/23 1104     Visit Number 5    Number of Visits 12    Date for PT Re-Evaluation 01/18/24    PT Start Time 1103    PT Stop Time 1152    PT Time Calculation (min) 49 min    Activity Tolerance Patient tolerated treatment well    Behavior During Therapy [facility] for tasks assessed/performed              Past Medical History:  Diagnosis Date   Allergy    Asthma    Back pain    Breast cancer in female [facility])    Left   Breast disorder    breast cancer May 2021   Chronic back pain    Fatty liver    Headache    High triglycerides    History of breast cancer    History of COVID-19 04/20/2021   Hx of migraines    Morbid obesity (HCC)    Personal history of chemotherapy    Personal history of radiation therapy    SOB (shortness of breath)    Vitamin D deficiency    Past Surgical History:  Procedure Laterality Date   BREAST LUMPECTOMY     BREAST LUMPECTOMY WITH RADIOACTIVE SEED AND SENTINEL LYMPH NODE BIOPSY Left 07/15/2020   Procedure: LEFT BREAST LUMPECTOMY WITH RADIOACTIVE SEED AND LEFT AXILLARY SENTINEL LYMPH NODE BIOPSY, LEFT AXILLARY NODE RADIOACTIVE SEED GUIDED EXCISION, LEFT BREAST RADIOACTIVE SEED GUIDED EXCISION IM NODE;  Surgeon: Emelia Loron, MD;  Location: [facility] OR;  Service: General;  Laterality: Left;  PEC BLOCK   CESAREAN SECTION WITH BILATERAL TUBAL LIGATION Bilateral 07/23/2015   Procedure: CESAREAN SECTION WITH BILATERAL TUBAL LIGATION;  Surgeon: Reva Bores, MD;  Location: [facility];  Service: Obstetrics;  Laterality: Bilateral;   IR IMAGING GUIDED PORT INSERTION  02/11/2022   MASTECTOMY Left    MODIFIED MASTECTOMY Left 08/22/2022   Procedure: LEFT MODIFIED RADICAL MASTECTOMY, LEFT AXILLARY NODE DISSECTION, AND SKIN BIOPSY OF BACK;  Surgeon:  Emelia Loron, MD;  Location: [facility];  Service: General;  Laterality: Left;   PORT-A-CATH REMOVAL N/A 04/30/2021   Procedure: REMOVAL PORT-A-CATH;  Surgeon: Emelia Loron, MD;  Location: [facility];  Service: General;  Laterality: N/A;   PORTACATH PLACEMENT Right 02/11/2020   Procedure: INSERTION PORT-A-CATH WITH ULTRASOUND GUIDANCE;  Surgeon: Emelia Loron, MD;  Location: [facility];  Service: General;  Laterality: Right;   TUBAL LIGATION     Patient Active Problem List   Diagnosis Date Noted   Prediabetes 08/22/2023   Abnormal craving 08/22/2023   Pure hypercholesterolemia 08/22/2023   Has health insurance with inadequate coverage of health expenses 08/22/2023   Elevated blood sugar 08/08/2023   Metabolic dysfunction-associated steatotic liver disease (MASLD) 08/08/2023   Class 2 severe obesity with serious comorbidity and body mass index (BMI) of 35.0 to 35.9 in adult [facility]) 08/08/2023   Vitamin B12 deficiency 02/03/2023   Vitamin D deficiency 11/04/2022   Health care maintenance 09/30/2022   S/P left mastectomy 08/22/2022   Burning with urination 06/01/2022   Swelling of left upper extremity 05/18/2022   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) 04/01/2022   Encounter for dental examination 03/11/2022   Acute apical periodontitis 03/11/2022   Phobia of dental procedure 03/11/2022  Defective dental restoration 03/11/2022   Accretions on teeth 03/11/2022   Chronic periodontitis 03/11/2022   Teeth missing 03/11/2022   Caries 03/11/2022   Generalized gingival recession 03/11/2022   Pain, dental 03/08/2022   Cancer-related pain 03/08/2022   Port-A-Cath in place 02/15/2022   Oral allergy syndrome, subsequent encounter 02/03/2022   Adverse effect of other drugs, medicaments and biological substances, subsequent encounter 02/02/2022   Other adverse food reactions, not elsewhere classified, subsequent encounter 02/02/2022   Chest tightness  02/02/2022   Other allergic rhinitis 02/02/2022   Allergic conjunctivitis of both eyes 02/02/2022   Language barrier 12/11/2020   Morbid obesity (HCC)    History of breast cancer    Genetic testing 02/13/2020   Malignant neoplasm of upper-outer quadrant of left breast in female, estrogen receptor positive (HCC) 01/27/2020   Impaired ability to use community resources due to language barrier 07/13/2013    PCP:   REFERRING PROVIDER: Emelia Loron, MD  REFERRING DIAG: Lymphedema  THERAPY DIAG:  Postmastectomy lymphedema  Stiffness of left shoulder, not elsewhere classified  Aftercare following surgery for neoplasm  Abnormal posture  Muscle weakness (generalized)  Pain in thoracic spine  Malignant neoplasm of upper-outer quadrant of left breast in female, estrogen receptor positive (HCC)  ONSET DATE:11 /20/2023 with recent exacerbation  Rationale for Evaluation and Treatment: Rehabilitation  SUBJECTIVE:                                                                                                                                                                                           SUBJECTIVE STATEMENT: The arm feels good.   EVAL  She sees and feels the arm more swollen, the chest and her back. She wants to have it checked to  be sure . The arm feels heavy and she has mild pain in Her entire left arm. It feels different that her normal pain, like its in the bone. Her sleeve is about 73 months old now. She has a night garment, but she doesn't wear it every night because her arm feels trapped. She gets tired of it because it is annoying. Sometimes she doesn' wear the glove when doing housework, dishes etc. I still feel pulling in my armpit with reaching.  PERTINENT HISTORY:  Patient was diagnosed on 01/23/2020 with left grade III invasive ductal carcinoma breast cancer. It is ER positive, PR negative, and HER2 negative , but functionally Triple Negative,with a Ki67 of  40%. Patient with interpreter present. She underwent neoadjuvant chemotherapy from 02/12/2020 - 05/28/2020. 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 She underwent a left lumpectomy and sentinel node biopsy (  4 negative nodes) on 07/15/2020. She underwent radiation and anti-estrogen therapy.  She had interval development of diffuse skin thickening in 2023. She had a punch biopsy of her skin at that time and this was consistent with inflammatory breast cancer. She has been treated with chemo since then. She had a left modified radical mastectomy on 08/22/2022 with complete therapeutic response and 0/1 LN. She noted some swelling in her left arm and on 08/25/2022 she had an [place] that was negative for DVT. Her SOZO screen on 09/19/2021 was 6.9 points above baseline putting her in the yellow zone and she was given a compression sleeve that was not containing  her so a flat knit sleeve and glove and nighttime garment  have been ordered   PAIN:  Are you having pain? Not presently NPRS scale: 0/10 Pain location: left arm Pain orientation: Left  PAIN TYPE: aching Pain description: intermittent  Aggravating factors: can't sleep on left side Relieving factors: laying down flat  PRECAUTIONS: lymphedema [name]   RED FLAGS: Bowel or bladder incontinence: No   WEIGHT BEARING RESTRICTIONS: No  FALLS:  Has patient fallen in last 6 months? No  LIVING ENVIRONMENT: Lives with: lives with their family and lives with their daughters, 25, 40, 83 Lives in: House/apartment Stairs: No;  Has following equipment at home: None  OCCUPATION: No  LEISURE: walking,  HAND DOMINANCE: right   PRIOR LEVEL OF FUNCTION: Independent  PATIENT GOALS: To see if her arm is getting bigger, she can't reach her back to massage.   OBJECTIVE: Note: Objective measures were completed at Evaluation unless otherwise noted.  COGNITION: Overall cognitive status: Within functional limits for tasks  assessed   PALPATION:   OBSERVATIONS / OTHER ASSESSMENTS: increased swelling at left chest/axilla and left lateral trunk and back, No pitting edema in arm  SENSATION: Light touch: Deficits     POSTURE: Forward head, rounded shoulders UPPER EXTREMITY AROM/PROM:  A/PROM RIGHT   eval   Shoulder extension   Shoulder flexion 155  Shoulder abduction 175  Shoulder internal rotation   Shoulder external rotation     (Blank rows = not tested)  A/PROM LEFT   eval Left 12/11/2023  Shoulder extension    Shoulder flexion 149 155  Shoulder abduction 150 165  Shoulder internal rotation    Shoulder external rotation      (Blank rows = not tested)  CERVICAL AROM: All within normal limits:      UPPER EXTREMITY STRENGTH:   SURGERY TYPE/DATE: Left Lumpectomy with SLNB11/11/2019, Left modified mastectomy with left axillary node dissection 08/22/2022 NUMBER OF LYMPH NODES REMOVED: 0/4 with lumpectomy, 0/1 with Mastectomy   CHEMOTHERAPY: YES  RADIATION:YES  HORMONE TREATMENT:   INFECTIONS: IBC   LYMPHEDEMA ASSESSMENTS:   LANDMARK RIGHT  eval  At axilla  39.0  15 cm proximal to olecranon process   10 cm proximal to olecranon process 34.3  Olecranon process 28.2  15 cm proximal to ulnar styloid process 28.4  10 cm proximal to ulnar styloid process 26.9  Just proximal to ulnar styloid process 18.8  Across hand at thumb web space 20.5  At base of 2nd digit 6.35  (Blank rows = not tested)  LANDMARK LEFT  eval  At axilla  40.4  15 cm proximal to olecranon process   10 cm proximal to olecranon process 34.5  Olecranon process 28.2  15 cm proximal to ulnar styloid process 28.1  10 cm proximal to ulnar styloid process 26.5  Just proximal to ulnar  styloid process 18.8  Across hand at thumb web space 20.25  At base of 2nd digit 6.25  (Blank rows = not tested)   FUNCTIONAL TESTS:    GAIT: WNL   L-DEX LYMPHEDEMA SCREENING: The patient was assessed using the L-Dex  machine today to produce a lymphedema index baseline score. The patient will be reassessed on a regular basis (typically every 3 months) to obtain new L-Dex scores. If the score is > 6.5 points away from his/her baseline score indicating onset of subclinical lymphedema, it will be recommended to wear a compression garment for 4 weeks, 12 hours per day and then be reassessed. If the score continues to be > 6.5 points from baseline at reassessment, we will initiate lymphedema treatment. Assessing in this manner has a 95% rate of preventing clinically significant lymphedema.  QUICK DASH SURVEY:   Lymphedema Life Impact Scale; 44%                                                                                                                            TREATMENT DATE:  12/20/23: Discussed ability to measure for off the shelf to check to see if one would fit so she would have a backup in case she is unable to get financial assistance for custom. Awaiting a return call from a company that may be able to assist pt with obtaining custom garment.  Manual lymph drainage in supine as follows: short neck, 5 diaphragmatic breaths,  right axillary nodes, left inguinal nodes; anterior inter-axillary anastamoses, left axillo-inguinal anastomoses then left upper extremity from fingers and dorsal hand to lateral shoulder working proximal to distal then retracing steps redirecting along pathways. Then to R sidelying to L lateral trunk moving fluid along axillo inguinal anastomosis and posterior inter axillary anastomosis and retraced pathways in supine ending with R axillary nodes and L inguinal nodes.   12/18/23: Manual lymph drainage in supine as follows: short neck, 5 diaphragmatic breaths,  right axillary nodes, left inguinal nodes; anterior inter-axillary anastamoses, left axillo-inguinal anastomoses then left upper extremity from fingers and dorsal hand to lateral shoulder working proximal to distal then retracing steps  redirecting along pathways. Then to R sidelying to L lateral trunk moving fluid along axillo inguinal anastomosis and posterior inter axillary anastomosis and retraced pathways in supine ending with R axillary nodes and L inguinal nodes.   12/13/2023 Pt late Discussion with pt and  interpreter, Alba about compression sleeve. Pt wants one that pulls up higher but explained there aren't any that pull up over the top of the shoulder. She is concerned that the top of her sleeve may be too tight. Being measured today by Rodman Pickle for new garments but explained I will need to determine how and where to order from with her insurance Also discussed compression pump;advised we are waiting for MD script before it can be ordered, but all other information has been sent and the hope is it will be approved .  In supine: Short neck, superficial and deep abdominals, Activated R axillary nodes and left axillary LN's and establishment of interaxillary pathway, L inguinal nodes and establishment of axilloinguinal pathway, then L chest and UE working proximal to distal, moving fluid from upper inner arm outwards, and doing both sides of forearm moving fluid towards pathways spending extra time in any areas of fibrosis then retracing all steps. Also performed axillo inguinal pathway in S-L and worked on swelling at upper back in SL across posterior interaxillary anastomosis.  12/11/2023 Overhead pulleys 2 min flexion and abduction In supine: Short neck, superficial and deep abdominals, Activated R axillary nodes and left axillary LN's and establishment of interaxillary pathway, L inguinal nodes and establishment of axilloinguinal pathway, then L chest and UE working proximal to distal, moving fluid from upper inner arm outwards, and doing both sides of forearm moving fluid towards pathways spending extra time in any areas of fibrosis then retracing all steps. Also performed axillo inguinal pathway in S-L and worked on swelling at  upper back in SL across posterior interaxillary anastomosis. Had pt practice neck, anterior interaxillary pathway. And UE with VC's and TC's for proper pressure and to slow down. Lengthy discussion with pt via interpreter about importance of wearing sleeve while performing household chores as that is when she is more likely to swell. It is OK to wear night garment in the daytime but hshe would take it off to do her work and then put it on, and later when doing more chores would remove it again. Discussed wearing daytime sleeve and glove, and wearing food service type glove to cover her glove or remove glove temporarily when preparing food and then put back on. Pt verbalized understanding. Pt is also agreeable to let me send demographics and treatment note to [facility] cares to look into compression pump for her.  12/07/2023 Pt with questions about lymphedema and if her arm will continue to get bigger. Explained management of lymphedema and why it is important and went over measurements with her. Explained that her arm is doing quite well, but she does require new garments as they are nearly 27 months old. We will also try to get her a Compression pump from [facility]. We will start with MLD to the chest, trunk and arm and review with her.. Encourage night garment as much as possible.She will also benefit from some stretches for her left shoulder due to tightness in the chest and axilla.   PATIENT EDUCATION:  Education details: lymphedema, need for new garments, will put in for compression pump again Person educated: Patient, interpreter Education method: Explanation Education comprehension: verbalized understanding  HOME EXERCISE PROGRAM: None given today  ASSESSMENT:  CLINICAL IMPRESSION: Still waiting to hear back from a company to see if they would be able to provide financial assistance for pt to obtain custom sleeve and glove. Discussed re measuring for off the shelf (Sigvaris secure or  exostrong) as a backup. Pt wants to think about this. Continued with MLD to L UE with increased time spent at lateral trunk where there is increased fullness. Also discussed area of redness at chest, upper back and axilla. Pt reports this has been there for 6 months and has been check by her doctor and they did not find anything concerning.   OBJECTIVE IMPAIRMENTS: decreased activity tolerance, decreased knowledge of condition, decreased ROM, increased edema, impaired flexibility, impaired UE functional use, postural dysfunction, and pain.   ACTIVITY LIMITATIONS: carrying, sleeping, and reach over head  PARTICIPATION LIMITATIONS:  pt is doing what is required of her with some restrictions with activities requiring reaching overhead.  PERSONAL FACTORS: 3+ comorbidities: Left breast cancer s/p 2 surgeries,chemo and radiation  are also affecting patient's functional outcome.   REHAB POTENTIAL: Good  CLINICAL DECISION MAKING: Stable/uncomplicated  EVALUATION COMPLEXITY: Low  GOALS: Goals reviewed with patient? Yes  SHORT TERM GOALS: Target date: 12/28/2023  Pt will be independent in MLD to the left UE, chest, lateral trunk Baseline: Goal status: INITIAL  2.  Pt will be independent in HEP to improve left shoulder ROM/strength Baseline:  Goal status: INITIAL  3.  Pt will note decreased left UE achiness by 25% Baseline:  Goal status: INITIAL  4.  Pt will be compliant with compression bra to reduce trunk edema Baseline:  Goal status: INITIAL    LONG TERM GOALS: Target date: 01/18/2024  Pt will be approved for a sequential compression pump to decrease left trunk/arm lymphedema Baseline:  Goal status: INITIAL  2.  Pt will have achiness in eft arm reduced by 50% or greater Baseline:  Goal status: INITIAL  3.  Pt will have left shoulder abd atleast 165 degrees for improved reaching Baseline:  Goal status: INITIAL  4.  Pt will note decreased chest and trunk edema by  50% Baseline:  Goal status: INITIAL  5.  Pt will have new, appropriate compression garments to maintain left UE edema Baseline:  Goal status: INITIAL   PLAN:  PT FREQUENCY: 2x/week  PT DURATION: 6 weeks  PLANNED INTERVENTIONS: 97164- PT Re-evaluation, 97110-Therapeutic exercises, 97530- Therapeutic activity, 97112- Neuromuscular re-education, 97535- Self Care, 16109- Manual therapy, 97760- Orthotic Fit/training, and Patient/Family education  PLAN FOR NEXT SESSION: shoulder wall climbs for abduction or pulleys, PROM prn, Initiate MLD to left UE, lateral trunk, back and start instructing pt, (pt to be measured for new garments on Wed),Measure periodically, Demographics/note sent to Barron [facility], order form sent to MD for signature(12/13/2023)  Leonette Most, PT 12/20/2023, 11:54 AM

## 2023-12-26 ENCOUNTER — Ambulatory Visit: Admitting: Physical Therapy

## 2023-12-26 DIAGNOSIS — M25612 Stiffness of left shoulder, not elsewhere classified: Secondary | ICD-10-CM

## 2023-12-26 DIAGNOSIS — I972 Postmastectomy lymphedema syndrome: Secondary | ICD-10-CM

## 2023-12-26 DIAGNOSIS — R293 Abnormal posture: Secondary | ICD-10-CM

## 2023-12-26 DIAGNOSIS — Z483 Aftercare following surgery for neoplasm: Secondary | ICD-10-CM

## 2023-12-26 NOTE — Therapy (Signed)
 OUTPATIENT PHYSICAL THERAPY  UPPER EXTREMITY ONCOLOGY TREATMENT  Patient Name: [name] MRN: [id number] DOB:[birth year], 48 y.o., female Today's Date: 12/26/2023  END OF SESSION:  PT End of Session - 12/26/23 1008     Visit Number 6    Number of Visits 12    Date for PT Re-Evaluation 01/18/24    PT Start Time 1008    PT Stop Time 1101    PT Time Calculation (min) 53 min    Activity Tolerance Patient tolerated treatment well    Behavior During Therapy [facility] for tasks assessed/performed              Past Medical History:  Diagnosis Date   Allergy    Asthma    Back pain    Breast cancer in female [facility])    Left   Breast disorder    breast cancer May 2021   Chronic back pain    Fatty liver    Headache    High triglycerides    History of breast cancer    History of COVID-19 04/20/2021   Hx of migraines    Morbid obesity (HCC)    Personal history of chemotherapy    Personal history of radiation therapy    SOB (shortness of breath)    Vitamin D deficiency    Past Surgical History:  Procedure Laterality Date   BREAST LUMPECTOMY     BREAST LUMPECTOMY WITH RADIOACTIVE SEED AND SENTINEL LYMPH NODE BIOPSY Left 07/15/2020   Procedure: LEFT BREAST LUMPECTOMY WITH RADIOACTIVE SEED AND LEFT AXILLARY SENTINEL LYMPH NODE BIOPSY, LEFT AXILLARY NODE RADIOACTIVE SEED GUIDED EXCISION, LEFT BREAST RADIOACTIVE SEED GUIDED EXCISION IM NODE;  Surgeon: Emelia Loron, MD;  Location: [facility] OR;  Service: General;  Laterality: Left;  PEC BLOCK   CESAREAN SECTION WITH BILATERAL TUBAL LIGATION Bilateral 07/23/2015   Procedure: CESAREAN SECTION WITH BILATERAL TUBAL LIGATION;  Surgeon: Reva Bores, MD;  Location: [facility];  Service: Obstetrics;  Laterality: Bilateral;   IR IMAGING GUIDED PORT INSERTION  02/11/2022   MASTECTOMY Left    MODIFIED MASTECTOMY Left 08/22/2022   Procedure: LEFT MODIFIED RADICAL MASTECTOMY, LEFT AXILLARY NODE DISSECTION, AND SKIN BIOPSY OF BACK;  Surgeon:  Emelia Loron, MD;  Location: [facility];  Service: General;  Laterality: Left;   PORT-A-CATH REMOVAL N/A 04/30/2021   Procedure: REMOVAL PORT-A-CATH;  Surgeon: Emelia Loron, MD;  Location: [facility];  Service: General;  Laterality: N/A;   PORTACATH PLACEMENT Right 02/11/2020   Procedure: INSERTION PORT-A-CATH WITH ULTRASOUND GUIDANCE;  Surgeon: Emelia Loron, MD;  Location: [facility];  Service: General;  Laterality: Right;   TUBAL LIGATION     Patient Active Problem List   Diagnosis Date Noted   Prediabetes 08/22/2023   Abnormal craving 08/22/2023   Pure hypercholesterolemia 08/22/2023   Has health insurance with inadequate coverage of health expenses 08/22/2023   Elevated blood sugar 08/08/2023   Metabolic dysfunction-associated steatotic liver disease (MASLD) 08/08/2023   Class 2 severe obesity with serious comorbidity and body mass index (BMI) of 35.0 to 35.9 in adult [facility]) 08/08/2023   Vitamin B12 deficiency 02/03/2023   Vitamin D deficiency 11/04/2022   Health care maintenance 09/30/2022   S/P left mastectomy 08/22/2022   Burning with urination 06/01/2022   Swelling of left upper extremity 05/18/2022   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) 04/01/2022   Encounter for dental examination 03/11/2022   Acute apical periodontitis 03/11/2022   Phobia of dental procedure 03/11/2022  Defective dental restoration 03/11/2022   Accretions on teeth 03/11/2022   Chronic periodontitis 03/11/2022   Teeth missing 03/11/2022   Caries 03/11/2022   Generalized gingival recession 03/11/2022   Pain, dental 03/08/2022   Cancer-related pain 03/08/2022   Port-A-Cath in place 02/15/2022   Oral allergy syndrome, subsequent encounter 02/03/2022   Adverse effect of other drugs, medicaments and biological substances, subsequent encounter 02/02/2022   Other adverse food reactions, not elsewhere classified, subsequent encounter 02/02/2022   Chest tightness  02/02/2022   Other allergic rhinitis 02/02/2022   Allergic conjunctivitis of both eyes 02/02/2022   Language barrier 12/11/2020   Morbid obesity (HCC)    History of breast cancer    Genetic testing 02/13/2020   Malignant neoplasm of upper-outer quadrant of left breast in female, estrogen receptor positive (HCC) 01/27/2020   Impaired ability to use community resources due to language barrier 07/13/2013    PCP:   REFERRING PROVIDER: Enid Harry, MD  REFERRING DIAG: Lymphedema  THERAPY DIAG:  Postmastectomy lymphedema  Stiffness of left shoulder, not elsewhere classified  Aftercare following surgery for neoplasm  Abnormal posture  ONSET DATE:11 /20/2023 with recent exacerbation  Rationale for Evaluation and Treatment: Rehabilitation  SUBJECTIVE:                                                                                                                                                                                           SUBJECTIVE STATEMENT: Pt states she is doing the exercies on her own and get benefit from the massage when she comes here.  It helps with the pain and the swelling. She is still wearing the sleeve at home.  EVAL  She sees and feels the arm more swollen, the chest and her back. She wants to have it checked to  be sure . The arm feels heavy and she has mild pain in Her entire left arm. It feels different that her normal pain, like its in the bone. Her sleeve is about 35 months old now. She has a night garment, but she doesn't wear it every night because her arm feels trapped. She gets tired of it because it is annoying. Sometimes she doesn' wear the glove when doing housework, dishes etc. I still feel pulling in my armpit with reaching.  PERTINENT HISTORY:  Patient was diagnosed on 01/23/2020 with left grade III invasive ductal carcinoma breast cancer. It is ER positive, PR negative, and HER2 negative , but functionally Triple Negative,with a Ki67 of  40%. Patient with interpreter present. She underwent neoadjuvant chemotherapy from 02/12/2020 - 05/28/2020. 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 She  underwent a left lumpectomy and sentinel node biopsy ( 4 negative nodes) on 07/15/2020. She underwent radiation and anti-estrogen therapy.  She had interval development of diffuse skin thickening in 2023. She had a punch biopsy of her skin at that time and this was consistent with inflammatory breast cancer. She has been treated with chemo since then. She had a left modified radical mastectomy on 08/22/2022 with complete therapeutic response and 0/1 LN. She noted some swelling in her left arm and on 08/25/2022 she had an [place] that was negative for DVT. Her SOZO screen on 09/19/2021 was 6.9 points above baseline putting her in the yellow zone and she was given a compression sleeve that was not containing  her so a flat knit sleeve and glove and nighttime garment  have been ordered   PAIN:  Are you having pain? Not presently, always has soreness, but does not have pain.  NPRS scale: 0/10 Pain location: left arm Pain orientation: Left  PAIN TYPE: aching Pain description: intermittent  Aggravating factors: can't sleep on left side Relieving factors: laying down flat  PRECAUTIONS: lymphedema [name]   RED FLAGS: Bowel or bladder incontinence: No   WEIGHT BEARING RESTRICTIONS: No  FALLS:  Has patient fallen in last 6 months? No  LIVING ENVIRONMENT: Lives with: lives with their family and lives with their daughters, 62, 76, 47 Lives in: House/apartment Stairs: No;  Has following equipment at home: None  OCCUPATION: No  LEISURE: walking,  HAND DOMINANCE: right   PRIOR LEVEL OF FUNCTION: Independent  PATIENT GOALS: To see if her arm is getting bigger, she can't reach her back to massage.   OBJECTIVE: Note: Objective measures were completed at Evaluation unless otherwise noted.  COGNITION: Overall cognitive status: Within  functional limits for tasks assessed   PALPATION:   OBSERVATIONS / OTHER ASSESSMENTS: increased swelling at left chest/axilla and left lateral trunk and back, No pitting edema in arm  SENSATION: Light touch: Deficits     POSTURE: Forward head, rounded shoulders UPPER EXTREMITY AROM/PROM:  A/PROM RIGHT   eval   Shoulder extension   Shoulder flexion 155  Shoulder abduction 175  Shoulder internal rotation   Shoulder external rotation     (Blank rows = not tested)  A/PROM LEFT   eval Left 12/11/2023  Shoulder extension    Shoulder flexion 149 155  Shoulder abduction 150 165  Shoulder internal rotation    Shoulder external rotation      (Blank rows = not tested)  CERVICAL AROM: All within normal limits:      UPPER EXTREMITY STRENGTH:   SURGERY TYPE/DATE: Left Lumpectomy with SLNB11/11/2019, Left modified mastectomy with left axillary node dissection 08/22/2022 NUMBER OF LYMPH NODES REMOVED: 0/4 with lumpectomy, 0/1 with Mastectomy   CHEMOTHERAPY: YES  RADIATION:YES  HORMONE TREATMENT:   INFECTIONS: IBC   LYMPHEDEMA ASSESSMENTS:   LANDMARK RIGHT  eval  At axilla  39.0  15 cm proximal to olecranon process   10 cm proximal to olecranon process 34.3  Olecranon process 28.2  15 cm proximal to ulnar styloid process 28.4  10 cm proximal to ulnar styloid process 26.9  Just proximal to ulnar styloid process 18.8  Across hand at thumb web space 20.5  At base of 2nd digit 6.35  (Blank rows = not tested)  LANDMARK LEFT  eval  At axilla  40.4  15 cm proximal to olecranon process   10 cm proximal to olecranon process 34.5  Olecranon process 28.2  15 cm proximal to  ulnar styloid process 28.1  10 cm proximal to ulnar styloid process 26.5  Just proximal to ulnar styloid process 18.8  Across hand at thumb web space 20.25  At base of 2nd digit 6.25  (Blank rows = not tested)   FUNCTIONAL TESTS:    GAIT: WNL   L-DEX LYMPHEDEMA SCREENING: The patient was  assessed using the L-Dex machine today to produce a lymphedema index baseline score. The patient will be reassessed on a regular basis (typically every 3 months) to obtain new L-Dex scores. If the score is > 6.5 points away from his/her baseline score indicating onset of subclinical lymphedema, it will be recommended to wear a compression garment for 4 weeks, 12 hours per day and then be reassessed. If the score continues to be > 6.5 points from baseline at reassessment, we will initiate lymphedema treatment. Assessing in this manner has a 95% rate of preventing clinically significant lymphedema.  QUICK DASH SURVEY:   Lymphedema Life Impact Scale; 44%                                                                                                                            TREATMENT DATE:  12/26/2023: Manual lymph drainage in supine as follows: short neck, 5 diaphragmatic breaths,  right axillary nodes, left inguinal nodes; anterior inter-axillary anastamoses, left axillo-inguinal anastomoses then left upper extremity from fingers and dorsal hand to lateral shoulder working proximal to distal then retracing steps redirecting along pathways. Then to R sidelying to L lateral trunk moving fluid along axillo inguinal anastomosis and posterior inter axillary anastomosis and retraced pathways in supine ending with R axillary nodes and L inguinal nodes. Extra time spent in sidelying with addition of remedial exercises with hand pointed to ceiling   12/20/23: Discussed ability to measure for off the shelf to check to see if one would fit so she would have a backup in case she is unable to get financial assistance for custom. Awaiting a return call from a company that may be able to assist pt with obtaining custom garment.  Manual lymph drainage in supine as follows: short neck, 5 diaphragmatic breaths,  right axillary nodes, left inguinal nodes; anterior inter-axillary anastamoses, left axillo-inguinal anastomoses  then left upper extremity from fingers and dorsal hand to lateral shoulder working proximal to distal then retracing steps redirecting along pathways. Then to R sidelying to L lateral trunk moving fluid along axillo inguinal anastomosis and posterior inter axillary anastomosis and retraced pathways in supine ending with R axillary nodes and L inguinal nodes.   12/18/23: Manual lymph drainage in supine as follows: short neck, 5 diaphragmatic breaths,  right axillary nodes, left inguinal nodes; anterior inter-axillary anastamoses, left axillo-inguinal anastomoses then left upper extremity from fingers and dorsal hand to lateral shoulder working proximal to distal then retracing steps redirecting along pathways. Then to R sidelying to L lateral trunk moving fluid along axillo inguinal anastomosis and posterior inter axillary anastomosis and retraced pathways  in supine ending with R axillary nodes and L inguinal nodes.   12/13/2023 Pt late Discussion with pt and  interpreter, Alba about compression sleeve. Pt wants one that pulls up higher but explained there aren't any that pull up over the top of the shoulder. She is concerned that the top of her sleeve may be too tight. Being measured today by Dorisann Garre for new garments but explained I will need to determine how and where to order from with her insurance Also discussed compression pump;advised we are waiting for MD script before it can be ordered, but all other information has been sent and the hope is it will be approved .  In supine: Short neck, superficial and deep abdominals, Activated R axillary nodes and left axillary LN's and establishment of interaxillary pathway, L inguinal nodes and establishment of axilloinguinal pathway, then L chest and UE working proximal to distal, moving fluid from upper inner arm outwards, and doing both sides of forearm moving fluid towards pathways spending extra time in any areas of fibrosis then retracing all steps. Also  performed axillo inguinal pathway in S-L and worked on swelling at upper back in SL across posterior interaxillary anastomosis.  12/11/2023 Overhead pulleys 2 min flexion and abduction In supine: Short neck, superficial and deep abdominals, Activated R axillary nodes and left axillary LN's and establishment of interaxillary pathway, L inguinal nodes and establishment of axilloinguinal pathway, then L chest and UE working proximal to distal, moving fluid from upper inner arm outwards, and doing both sides of forearm moving fluid towards pathways spending extra time in any areas of fibrosis then retracing all steps. Also performed axillo inguinal pathway in S-L and worked on swelling at upper back in SL across posterior interaxillary anastomosis. Had pt practice neck, anterior interaxillary pathway. And UE with VC's and TC's for proper pressure and to slow down. Lengthy discussion with pt via interpreter about importance of wearing sleeve while performing household chores as that is when she is more likely to swell. It is OK to wear night garment in the daytime but hshe would take it off to do her work and then put it on, and later when doing more chores would remove it again. Discussed wearing daytime sleeve and glove, and wearing food service type glove to cover her glove or remove glove temporarily when preparing food and then put back on. Pt verbalized understanding. Pt is also agreeable to let me send demographics and treatment note to [facility] cares to look into compression pump for her.  12/07/2023 Pt with questions about lymphedema and if her arm will continue to get bigger. Explained management of lymphedema and why it is important and went over measurements with her. Explained that her arm is doing quite well, but she does require new garments as they are nearly 77 months old. We will also try to get her a Compression pump from [facility]. We will start with MLD to the chest, trunk and arm and review  with her.. Encourage night garment as much as possible.She will also benefit from some stretches for her left shoulder due to tightness in the chest and axilla.   PATIENT EDUCATION:  Education details: lymphedema, need for new garments, will put in for compression pump again Person educated: Patient, interpreter Education method: Explanation Education comprehension: verbalized understanding  HOME EXERCISE PROGRAM: None given today  ASSESSMENT:  CLINICAL IMPRESSION:  Continued with MLD to L UE with increased time spent at lateral trunk where there is increased fullness.  Reinforced remedial exercises and deep breathing.  OBJECTIVE IMPAIRMENTS: decreased activity tolerance, decreased knowledge of condition, decreased ROM, increased edema, impaired flexibility, impaired UE functional use, postural dysfunction, and pain.   ACTIVITY LIMITATIONS: carrying, sleeping, and reach over head  PARTICIPATION LIMITATIONS:  pt is doing what is required of her with some restrictions with activities requiring reaching overhead.  PERSONAL FACTORS: 3+ comorbidities: Left breast cancer s/p 2 surgeries,chemo and radiation  are also affecting patient's functional outcome.   REHAB POTENTIAL: Good  CLINICAL DECISION MAKING: Stable/uncomplicated  EVALUATION COMPLEXITY: Low  GOALS: Goals reviewed with patient? Yes  SHORT TERM GOALS: Target date: 12/28/2023  Pt will be independent in MLD to the left UE, chest, lateral trunk Baseline: Goal status: INITIAL  2.  Pt will be independent in HEP to improve left shoulder ROM/strength Baseline:  Goal status: INITIAL  3.  Pt will note decreased left UE achiness by 25% Baseline:  Goal status: INITIAL  4.  Pt will be compliant with compression bra to reduce trunk edema Baseline:  Goal status: INITIAL    LONG TERM GOALS: Target date: 01/18/2024  Pt will be approved for a sequential compression pump to decrease left trunk/arm lymphedema Baseline:  Goal  status: INITIAL  2.  Pt will have achiness in eft arm reduced by 50% or greater Baseline:  Goal status: INITIAL  3.  Pt will have left shoulder abd atleast 165 degrees for improved reaching Baseline:  Goal status: INITIAL  4.  Pt will note decreased chest and trunk edema by 50% Baseline:  Goal status: INITIAL  5.  Pt will have new, appropriate compression garments to maintain left UE edema Baseline:  Goal status: INITIAL   PLAN:  PT FREQUENCY: 2x/week  PT DURATION: 6 weeks  PLANNED INTERVENTIONS: 97164- PT Re-evaluation, 97110-Therapeutic exercises, 97530- Therapeutic activity, 97112- Neuromuscular re-education, 97535- Self Care, 81191- Manual therapy, 97760- Orthotic Fit/training, and Patient/Family education  PLAN FOR NEXT SESSION: Continue with MLD and exerices. shoulder wall climbs for abduction or pulleys, PROM prn, Initiate MLD to left UE, lateral trunk, back and start instructing pt, (pt to be measured for new garments on Wed),Measure periodically, Demographics/note sent to [facility], order form sent to MD for signature(12/13/2023) Ammon Bales K. Bevin Bucks PT  Molinda Angelica, PT 12/26/2023, 11:02 AM [facility] [address] [place], [place], [address] Phone: [phone]   Fax:  [phone]  Patient Details  Name: [name] MRN: [id number] Date of Birth: March 31, 1976 Referring Provider:  Enid Harry, MD  Encounter Date: 12/26/2023   Molinda Angelica, PT 12/26/2023, 11:02 AM  [facility] [address] [place], [place], [address] Phone: [phone]   Fax:  [phone]

## 2023-12-28 ENCOUNTER — Institutional Professional Consult (permissible substitution): Admitting: Plastic Surgery

## 2023-12-28 ENCOUNTER — Ambulatory Visit: Admitting: Physical Therapy

## 2023-12-28 DIAGNOSIS — R293 Abnormal posture: Secondary | ICD-10-CM

## 2023-12-28 DIAGNOSIS — M6281 Muscle weakness (generalized): Secondary | ICD-10-CM

## 2023-12-28 DIAGNOSIS — I972 Postmastectomy lymphedema syndrome: Secondary | ICD-10-CM

## 2023-12-28 DIAGNOSIS — Z483 Aftercare following surgery for neoplasm: Secondary | ICD-10-CM

## 2023-12-28 DIAGNOSIS — M25612 Stiffness of left shoulder, not elsewhere classified: Secondary | ICD-10-CM

## 2023-12-28 DIAGNOSIS — M546 Pain in thoracic spine: Secondary | ICD-10-CM

## 2023-12-28 NOTE — Therapy (Signed)
 OUTPATIENT PHYSICAL THERAPY  UPPER EXTREMITY ONCOLOGY TREATMENT  Patient Name: [name] MRN: [id number] DOB:05/24/1976, 48 y.o., female Today's Date: 12/28/2023  END OF SESSION:  PT End of Session - 12/28/23 1057     Visit Number 7    Number of Visits 12    Date for PT Re-Evaluation 01/18/24    PT Start Time 1003    PT Stop Time 1058    PT Time Calculation (min) 55 min    Behavior During Therapy [facility] for tasks assessed/performed              Past Medical History:  Diagnosis Date   Allergy    Asthma    Back pain    Breast cancer in female [facility])    Left   Breast disorder    breast cancer May 2021   Chronic back pain    Fatty liver    Headache    High triglycerides    History of breast cancer    History of COVID-19 04/20/2021   Hx of migraines    Morbid obesity (HCC)    Personal history of chemotherapy    Personal history of radiation therapy    SOB (shortness of breath)    Vitamin D deficiency    Past Surgical History:  Procedure Laterality Date   BREAST LUMPECTOMY     BREAST LUMPECTOMY WITH RADIOACTIVE SEED AND SENTINEL LYMPH NODE BIOPSY Left 07/15/2020   Procedure: LEFT BREAST LUMPECTOMY WITH RADIOACTIVE SEED AND LEFT AXILLARY SENTINEL LYMPH NODE BIOPSY, LEFT AXILLARY NODE RADIOACTIVE SEED GUIDED EXCISION, LEFT BREAST RADIOACTIVE SEED GUIDED EXCISION IM NODE;  Surgeon: Enid Harry, MD;  Location: [facility] OR;  Service: General;  Laterality: Left;  PEC BLOCK   CESAREAN SECTION WITH BILATERAL TUBAL LIGATION Bilateral 07/23/2015   Procedure: CESAREAN SECTION WITH BILATERAL TUBAL LIGATION;  Surgeon: Granville Layer, MD;  Location: [facility];  Service: Obstetrics;  Laterality: Bilateral;   IR IMAGING GUIDED PORT INSERTION  02/11/2022   MASTECTOMY Left    MODIFIED MASTECTOMY Left 08/22/2022   Procedure: LEFT MODIFIED RADICAL MASTECTOMY, LEFT AXILLARY NODE DISSECTION, AND SKIN BIOPSY OF BACK;  Surgeon: Enid Harry, MD;  Location: [facility];  Service:  General;  Laterality: Left;   PORT-A-CATH REMOVAL N/A 04/30/2021   Procedure: REMOVAL PORT-A-CATH;  Surgeon: Enid Harry, MD;  Location: [facility];  Service: General;  Laterality: N/A;   PORTACATH PLACEMENT Right 02/11/2020   Procedure: INSERTION PORT-A-CATH WITH ULTRASOUND GUIDANCE;  Surgeon: Enid Harry, MD;  Location: [facility];  Service: General;  Laterality: Right;   TUBAL LIGATION     Patient Active Problem List   Diagnosis Date Noted   Prediabetes 08/22/2023   Abnormal craving 08/22/2023   Pure hypercholesterolemia 08/22/2023   Has health insurance with inadequate coverage of health expenses 08/22/2023   Elevated blood sugar 08/08/2023   Metabolic dysfunction-associated steatotic liver disease (MASLD) 08/08/2023   Class 2 severe obesity with serious comorbidity and body mass index (BMI) of 35.0 to 35.9 in adult (HCC) 08/08/2023   Vitamin B12 deficiency 02/03/2023   Vitamin D deficiency 11/04/2022   Health care maintenance 09/30/2022   S/P left mastectomy 08/22/2022   Burning with urination 06/01/2022   Swelling of left upper extremity 05/18/2022   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) 04/01/2022   Encounter for dental examination 03/11/2022   Acute apical periodontitis 03/11/2022   Phobia of dental procedure 03/11/2022   Defective dental restoration 03/11/2022   Accretions on teeth  03/11/2022   Chronic periodontitis 03/11/2022   Teeth missing 03/11/2022   Caries 03/11/2022   Generalized gingival recession 03/11/2022   Pain, dental 03/08/2022   Cancer-related pain 03/08/2022   Port-A-Cath in place 02/15/2022   Oral allergy syndrome, subsequent encounter 02/03/2022   Adverse effect of other drugs, medicaments and biological substances, subsequent encounter 02/02/2022   Other adverse food reactions, not elsewhere classified, subsequent encounter 02/02/2022   Chest tightness 02/02/2022   Other allergic rhinitis 02/02/2022    Allergic conjunctivitis of both eyes 02/02/2022   Language barrier 12/11/2020   Morbid obesity (HCC)    History of breast cancer    Genetic testing 02/13/2020   Malignant neoplasm of upper-outer quadrant of left breast in female, estrogen receptor positive (HCC) 01/27/2020   Impaired ability to use community resources due to language barrier 07/13/2013    PCP:   REFERRING PROVIDER: Emelia Loron, MD  REFERRING DIAG: Lymphedema  THERAPY DIAG:  Postmastectomy lymphedema  Stiffness of left shoulder, not elsewhere classified  Aftercare following surgery for neoplasm  Abnormal posture  Muscle weakness (generalized)  Pain in thoracic spine  ONSET DATE:11 /20/2023 with recent exacerbation  Rationale for Evaluation and Treatment: Rehabilitation  SUBJECTIVE:                                                                                                                                                                                           SUBJECTIVE STATEMENT: Pt comes in wearing flat knit sleeve and glove that appear to fit well  EVAL  She sees and feels the arm more swollen, the chest and her back. She wants to have it checked to  be sure . The arm feels heavy and she has mild pain in Her entire left arm. It feels different that her normal pain, like its in the bone. Her sleeve is about 73 months old now. She has a night garment, but she doesn't wear it every night because her arm feels trapped. She gets tired of it because it is annoying. Sometimes she doesn' wear the glove when doing housework, dishes etc. I still feel pulling in my armpit with reaching.  PERTINENT HISTORY:  Patient was diagnosed on 01/23/2020 with left grade III invasive ductal carcinoma breast cancer. It is ER positive, PR negative, and HER2 negative , but functionally Triple Negative,with a Ki67 of 40%. Patient with interpreter present. She underwent neoadjuvant chemotherapy from 02/12/2020 - 05/28/2020. She  had to stop after 9 cycles due to peripheral neuropathy. She underwent a left lumpectomy and sentinel node biopsy ( 4 negative nodes) on 07/15/2020. She underwent radiation and anti-estrogen therapy.  She had  interval development of diffuse skin thickening in 2023. She had a punch biopsy of her skin at that time and this was consistent with inflammatory breast cancer. She has been treated with chemo since then. She had a left modified radical mastectomy on 08/22/2022 with complete therapeutic response and 0/1 LN. She noted some swelling in her left arm and on 08/25/2022 she had an [place] that was negative for DVT. Her SOZO screen on 09/19/2021 was 6.9 points above baseline putting her in the yellow zone and she was given a compression sleeve that was not containing  her so a flat knit sleeve and glove and nighttime garment  have been ordered   PAIN:  Are you having pain? Not presently, always has soreness, but does not have pain.  NPRS scale: 0/10 Pain location: left arm Pain orientation: Left  PAIN TYPE: aching Pain description: intermittent  Aggravating factors: can't sleep on left side Relieving factors: laying down flat  PRECAUTIONS: lymphedema [name]   RED FLAGS: Bowel or bladder incontinence: No   WEIGHT BEARING RESTRICTIONS: No  FALLS:  Has patient fallen in last 6 months? No  LIVING ENVIRONMENT: Lives with: lives with their family and lives with their daughters, 29, 3, 47 Lives in: House/apartment Stairs: No;  Has following equipment at home: None  OCCUPATION: No  LEISURE: walking,  HAND DOMINANCE: right   PRIOR LEVEL OF FUNCTION: Independent  PATIENT GOALS: To see if her arm is getting bigger, she can't reach her back to massage.   OBJECTIVE: Note: Objective measures were completed at Evaluation unless otherwise noted.  COGNITION: Overall cognitive status: Within functional limits for tasks assessed   PALPATION:   OBSERVATIONS / OTHER ASSESSMENTS: increased swelling at  left chest/axilla and left lateral trunk and back, No pitting edema in arm  SENSATION: Light touch: Deficits     POSTURE: Forward head, rounded shoulders UPPER EXTREMITY AROM/PROM:  A/PROM RIGHT   eval   Shoulder extension   Shoulder flexion 155  Shoulder abduction 175  Shoulder internal rotation   Shoulder external rotation     (Blank rows = not tested)  A/PROM LEFT   eval Left 12/11/2023  Shoulder extension    Shoulder flexion 149 155  Shoulder abduction 150 165  Shoulder internal rotation    Shoulder external rotation      (Blank rows = not tested)  CERVICAL AROM: All within normal limits:      UPPER EXTREMITY STRENGTH:   SURGERY TYPE/DATE: Left Lumpectomy with SLNB11/11/2019, Left modified mastectomy with left axillary node dissection 08/22/2022 NUMBER OF LYMPH NODES REMOVED: 0/4 with lumpectomy, 0/1 with Mastectomy   CHEMOTHERAPY: YES  RADIATION:YES  HORMONE TREATMENT:   INFECTIONS: IBC   LYMPHEDEMA ASSESSMENTS:   LANDMARK RIGHT  eval  At axilla  39.0  15 cm proximal to olecranon process   10 cm proximal to olecranon process 34.3  Olecranon process 28.2  15 cm proximal to ulnar styloid process 28.4  10 cm proximal to ulnar styloid process 26.9  Just proximal to ulnar styloid process 18.8  Across hand at thumb web space 20.5  At base of 2nd digit 6.35  (Blank rows = not tested)  LANDMARK LEFT  eval  At axilla  40.4  15 cm proximal to olecranon process   10 cm proximal to olecranon process 34.5  Olecranon process 28.2  15 cm proximal to ulnar styloid process 28.1  10 cm proximal to ulnar styloid process 26.5  Just proximal to ulnar styloid process 18.8  Across  hand at thumb web space 20.25  At base of 2nd digit 6.25  (Blank rows = not tested)   FUNCTIONAL TESTS:    GAIT: WNL   L-DEX LYMPHEDEMA SCREENING: The patient was assessed using the L-Dex machine today to produce a lymphedema index baseline score. The patient will be  reassessed on a regular basis (typically every 3 months) to obtain new L-Dex scores. If the score is > 6.5 points away from his/her baseline score indicating onset of subclinical lymphedema, it will be recommended to wear a compression garment for 4 weeks, 12 hours per day and then be reassessed. If the score continues to be > 6.5 points from baseline at reassessment, we will initiate lymphedema treatment. Assessing in this manner has a 95% rate of preventing clinically significant lymphedema.  QUICK DASH SURVEY:   Lymphedema Life Impact Scale; 44%                                                                                                                            TREATMENT DATE:  12/28/2023: Began with sitting ROM for neck, thorax and UE for lymphatic flow Manual lymph drainage in supine as follows: short neck, 5 diaphragmatic breaths,  right axillary nodes, left inguinal nodes; anterior inter-axillary anastamoses, left axillo-inguinal anastomoses then left upper extremity from fingers and dorsal hand to lateral shoulder working proximal to distal then retracing steps redirecting along pathways. Then to R sidelying to L lateral trunk moving fluid along axillo inguinal anastomosis and posterior inter axillary anastomosis and retraced pathways in supine ending with R axillary nodes and L inguinal nodes. Extra time spent in sidelying with addition of remedial exercises with hand pointed to ceiling  Pt able to don sleeve and glove without difficulty at end of session  12/26/2023: Manual lymph drainage in supine as follows: short neck, 5 diaphragmatic breaths,  right axillary nodes, left inguinal nodes; anterior inter-axillary anastamoses, left axillo-inguinal anastomoses then left upper extremity from fingers and dorsal hand to lateral shoulder working proximal to distal then retracing steps redirecting along pathways. Then to R sidelying to L lateral trunk moving fluid along axillo inguinal anastomosis  and posterior inter axillary anastomosis and retraced pathways in supine ending with R axillary nodes and L inguinal nodes. Extra time spent in sidelying with addition of remedial exercises with hand pointed to ceiling   12/20/23: Discussed ability to measure for off the shelf to check to see if one would fit so she would have a backup in case she is unable to get financial assistance for custom. Awaiting a return call from a company that may be able to assist pt with obtaining custom garment.  Manual lymph drainage in supine as follows: short neck, 5 diaphragmatic breaths,  right axillary nodes, left inguinal nodes; anterior inter-axillary anastamoses, left axillo-inguinal anastomoses then left upper extremity from fingers and dorsal hand to lateral shoulder working proximal to distal then retracing steps redirecting along pathways. Then to R sidelying to  L lateral trunk moving fluid along axillo inguinal anastomosis and posterior inter axillary anastomosis and retraced pathways in supine ending with R axillary nodes and L inguinal nodes.   12/18/23: Manual lymph drainage in supine as follows: short neck, 5 diaphragmatic breaths,  right axillary nodes, left inguinal nodes; anterior inter-axillary anastamoses, left axillo-inguinal anastomoses then left upper extremity from fingers and dorsal hand to lateral shoulder working proximal to distal then retracing steps redirecting along pathways. Then to R sidelying to L lateral trunk moving fluid along axillo inguinal anastomosis and posterior inter axillary anastomosis and retraced pathways in supine ending with R axillary nodes and L inguinal nodes.   12/13/2023 Pt late Discussion with pt and  interpreter, Alba about compression sleeve. Pt wants one that pulls up higher but explained there aren't any that pull up over the top of the shoulder. She is concerned that the top of her sleeve may be too tight. Being measured today by Dorisann Garre for new garments but explained  I will need to determine how and where to order from with her insurance Also discussed compression pump;advised we are waiting for MD script before it can be ordered, but all other information has been sent and the hope is it will be approved .  In supine: Short neck, superficial and deep abdominals, Activated R axillary nodes and left axillary LN's and establishment of interaxillary pathway, L inguinal nodes and establishment of axilloinguinal pathway, then L chest and UE working proximal to distal, moving fluid from upper inner arm outwards, and doing both sides of forearm moving fluid towards pathways spending extra time in any areas of fibrosis then retracing all steps. Also performed axillo inguinal pathway in S-L and worked on swelling at upper back in SL across posterior interaxillary anastomosis.  12/11/2023 Overhead pulleys 2 min flexion and abduction In supine: Short neck, superficial and deep abdominals, Activated R axillary nodes and left axillary LN's and establishment of interaxillary pathway, L inguinal nodes and establishment of axilloinguinal pathway, then L chest and UE working proximal to distal, moving fluid from upper inner arm outwards, and doing both sides of forearm moving fluid towards pathways spending extra time in any areas of fibrosis then retracing all steps. Also performed axillo inguinal pathway in S-L and worked on swelling at upper back in SL across posterior interaxillary anastomosis. Had pt practice neck, anterior interaxillary pathway. And UE with VC's and TC's for proper pressure and to slow down. Lengthy discussion with pt via interpreter about importance of wearing sleeve while performing household chores as that is when she is more likely to swell. It is OK to wear night garment in the daytime but hshe would take it off to do her work and then put it on, and later when doing more chores would remove it again. Discussed wearing daytime sleeve and glove, and wearing food  service type glove to cover her glove or remove glove temporarily when preparing food and then put back on. Pt verbalized understanding. Pt is also agreeable to let me send demographics and treatment note to [facility] cares to look into compression pump for her.  12/07/2023 Pt with questions about lymphedema and if her arm will continue to get bigger. Explained management of lymphedema and why it is important and went over measurements with her. Explained that her arm is doing quite well, but she does require new garments as they are nearly 11 months old. We will also try to get her a Compression pump from [facility]. We  will start with MLD to the chest, trunk and arm and review with her.. Encourage night garment as much as possible.She will also benefit from some stretches for her left shoulder due to tightness in the chest and axilla.   PATIENT EDUCATION:  Education details: lymphedema, need for new garments, will put in for compression pump again Person educated: Patient, interpreter Education method: Explanation Education comprehension: verbalized understanding  HOME EXERCISE PROGRAM: None given today  ASSESSMENT:  CLINICAL IMPRESSION:  Pt is independent with sleeve and glove.  She reports getting symptomatic relief with treatment   OBJECTIVE IMPAIRMENTS: decreased activity tolerance, decreased knowledge of condition, decreased ROM, increased edema, impaired flexibility, impaired UE functional use, postural dysfunction, and pain.   ACTIVITY LIMITATIONS: carrying, sleeping, and reach over head  PARTICIPATION LIMITATIONS:  pt is doing what is required of her with some restrictions with activities requiring reaching overhead.  PERSONAL FACTORS: 3+ comorbidities: Left breast cancer s/p 2 surgeries,chemo and radiation  are also affecting patient's functional outcome.   REHAB POTENTIAL: Good  CLINICAL DECISION MAKING: Stable/uncomplicated  EVALUATION COMPLEXITY: Low  GOALS: Goals  reviewed with patient? Yes  SHORT TERM GOALS: Target date: 12/28/2023  Pt will be independent in MLD to the left UE, chest, lateral trunk Baseline: Goal status: INITIAL  2.  Pt will be independent in HEP to improve left shoulder ROM/strength Baseline:  Goal status: INITIAL  3.  Pt will note decreased left UE achiness by 25% Baseline:  Goal status: INITIAL  4.  Pt will be compliant with compression bra to reduce trunk edema Baseline:  Goal status: INITIAL    LONG TERM GOALS: Target date: 01/18/2024  Pt will be approved for a sequential compression pump to decrease left trunk/arm lymphedema Baseline:  Goal status: INITIAL  2.  Pt will have achiness in eft arm reduced by 50% or greater Baseline:  Goal status: INITIAL  3.  Pt will have left shoulder abd atleast 165 degrees for improved reaching Baseline:  Goal status: INITIAL  4.  Pt will note decreased chest and trunk edema by 50% Baseline:  Goal status: INITIAL  5.  Pt will have new, appropriate compression garments to maintain left UE edema Baseline:  Goal status: INITIAL   PLAN:  PT FREQUENCY: 2x/week  PT DURATION: 6 weeks  PLANNED INTERVENTIONS: 97164- PT Re-evaluation, 97110-Therapeutic exercises, 97530- Therapeutic activity, 97112- Neuromuscular re-education, 97535- Self Care, 60454- Manual therapy, 97760- Orthotic Fit/training, and Patient/Family education  PLAN FOR NEXT SESSION: Continue with MLD and exerices. shoulder wall climbs for abduction or pulleys, PROM prn, Initiate MLD to left UE, lateral trunk, back and start instructing pt, (pt to be measured for new garments on Wed),Measure periodically, Demographics/note sent to [facility], order form sent to MD for signature(12/13/2023) Ammon Bales K. Bevin Bucks PT  Molinda Angelica, PT 12/28/2023, 12:19 PM [facility] [address] [place], [place], [address] Phone: [phone]   Fax:  [phone]  Patient Details   Name: Bret Stamour MRN: [id number] Date of Birth: 1976-05-18 Referring Provider:  Enid Harry, MD  Encounter Date: 12/28/2023   Molinda Angelica, PT 12/28/2023, 12:19 PM  [facility] [address] [place], [place], [address] Phone: [phone]   Fax:  [phone] [address] [place], [place], [address] Phone: [phone]   Fax:  [phone]  Patient Details  Name: Morgana Rowley MRN: [id number] Date of Birth: November 14, 1975 Referring Provider:  Enid Harry, MD  Encounter Date: 12/28/2023   Molinda Angelica, PT 12/28/2023, 12:19 PM  [facility] [address] [place], [place], [address] Phone: [phone]   Fax:  [phone]

## 2024-01-02 ENCOUNTER — Encounter: Payer: Self-pay | Admitting: Physical Therapy

## 2024-01-02 ENCOUNTER — Ambulatory Visit: Admitting: Physical Therapy

## 2024-01-02 DIAGNOSIS — M6281 Muscle weakness (generalized): Secondary | ICD-10-CM

## 2024-01-02 DIAGNOSIS — R293 Abnormal posture: Secondary | ICD-10-CM

## 2024-01-02 DIAGNOSIS — I972 Postmastectomy lymphedema syndrome: Secondary | ICD-10-CM

## 2024-01-02 DIAGNOSIS — Z483 Aftercare following surgery for neoplasm: Secondary | ICD-10-CM

## 2024-01-02 DIAGNOSIS — M25612 Stiffness of left shoulder, not elsewhere classified: Secondary | ICD-10-CM

## 2024-01-02 DIAGNOSIS — C50412 Malignant neoplasm of upper-outer quadrant of left female breast: Secondary | ICD-10-CM

## 2024-01-02 DIAGNOSIS — M546 Pain in thoracic spine: Secondary | ICD-10-CM

## 2024-01-02 NOTE — Therapy (Addendum)
 OUTPATIENT PHYSICAL THERAPY  UPPER EXTREMITY ONCOLOGY TREATMENT  Patient Name: [name] MRN: [id number] DOB:1975-11-09, 48 y.o., female Today's Date: 01/02/2024  END OF SESSION:  PT End of Session - 01/02/24 1009     Visit Number 8    Number of Visits 12    Date for PT Re-Evaluation 01/18/24    PT Start Time 1007    PT Stop Time 1052    PT Time Calculation (min) 45 min    Activity Tolerance Patient tolerated treatment well    Behavior During Therapy [facility] for tasks assessed/performed              Past Medical History:  Diagnosis Date   Allergy     Asthma    Back pain    Breast cancer in female [facility])    Left   Breast disorder    breast cancer May 2021   Chronic back pain    Fatty liver    Headache    High triglycerides    History of breast cancer    History of COVID-19 04/20/2021   Hx of migraines    Morbid obesity (HCC)    Personal history of chemotherapy    Personal history of radiation therapy    SOB (shortness of breath)    Vitamin D  deficiency    Past Surgical History:  Procedure Laterality Date   BREAST LUMPECTOMY     BREAST LUMPECTOMY WITH RADIOACTIVE SEED AND SENTINEL LYMPH NODE BIOPSY Left 07/15/2020   Procedure: LEFT BREAST LUMPECTOMY WITH RADIOACTIVE SEED AND LEFT AXILLARY SENTINEL LYMPH NODE BIOPSY, LEFT AXILLARY NODE RADIOACTIVE SEED GUIDED EXCISION, LEFT BREAST RADIOACTIVE SEED GUIDED EXCISION IM NODE;  Surgeon: Enid Harry, MD;  Location: [facility] OR;  Service: General;  Laterality: Left;  PEC BLOCK   CESAREAN SECTION WITH BILATERAL TUBAL LIGATION Bilateral 07/23/2015   Procedure: CESAREAN SECTION WITH BILATERAL TUBAL LIGATION;  Surgeon: Granville Layer, MD;  Location: [facility]S;  Service: Obstetrics;  Laterality: Bilateral;   IR IMAGING GUIDED PORT INSERTION  02/11/2022   MASTECTOMY Left    MODIFIED MASTECTOMY Left 08/22/2022   Procedure: LEFT MODIFIED RADICAL MASTECTOMY, LEFT AXILLARY NODE DISSECTION, AND SKIN BIOPSY OF BACK;  Surgeon:  Enid Harry, MD;  Location: [facility] ORS;  Service: General;  Laterality: Left;   PORT-A-CATH REMOVAL N/A 04/30/2021   Procedure: REMOVAL PORT-A-CATH;  Surgeon: Enid Harry, MD;  Location: [facility];  Service: General;  Laterality: N/A;   PORTACATH PLACEMENT Right 02/11/2020   Procedure: INSERTION PORT-A-CATH WITH ULTRASOUND GUIDANCE;  Surgeon: Enid Harry, MD;  Location: [facility];  Service: General;  Laterality: Right;   TUBAL LIGATION     Patient Active Problem List   Diagnosis Date Noted   Prediabetes 08/22/2023   Abnormal craving 08/22/2023   Pure hypercholesterolemia 08/22/2023   Has health insurance with inadequate coverage of health expenses 08/22/2023   Elevated blood sugar 08/08/2023   Metabolic dysfunction-associated steatotic liver disease (MASLD) 08/08/2023   Class 2 severe obesity with serious comorbidity and body mass index (BMI) of 35.0 to 35.9 in adult [facility]) 08/08/2023   Vitamin B12 deficiency 02/03/2023   Vitamin D  deficiency 11/04/2022   Health care maintenance 09/30/2022   S/P left mastectomy 08/22/2022   Burning with urination 06/01/2022   Swelling of left upper extremity 05/18/2022   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) 04/01/2022   Encounter for dental examination 03/11/2022   Acute apical periodontitis 03/11/2022   Phobia of dental procedure 03/11/2022  Defective dental restoration 03/11/2022   Accretions on teeth 03/11/2022   Chronic periodontitis 03/11/2022   Teeth missing 03/11/2022   Caries 03/11/2022   Generalized gingival recession 03/11/2022   Pain, dental 03/08/2022   Cancer-related pain 03/08/2022   Port-A-Cath in place 02/15/2022   Oral allergy  syndrome, subsequent encounter 02/03/2022   Adverse effect of other drugs, medicaments and biological substances, subsequent encounter 02/02/2022   Other adverse food reactions, not elsewhere classified, subsequent encounter 02/02/2022   Chest tightness  02/02/2022   Other allergic rhinitis 02/02/2022   Allergic conjunctivitis of both eyes 02/02/2022   Language barrier 12/11/2020   Morbid obesity (HCC)    History of breast cancer    Genetic testing 02/13/2020   Malignant neoplasm of upper-outer quadrant of left breast in female, estrogen receptor positive (HCC) 01/27/2020   Impaired ability to use community resources due to language barrier 07/13/2013    PCP:   REFERRING PROVIDER: Enid Harry, MD  REFERRING DIAG: Lymphedema  THERAPY DIAG:  Postmastectomy lymphedema  Stiffness of left shoulder, not elsewhere classified  Aftercare following surgery for neoplasm  Abnormal posture  Muscle weakness (generalized)  Pain in thoracic spine  Malignant neoplasm of upper-outer quadrant of left breast in female, estrogen receptor positive (HCC)  ONSET DATE:11 /20/2023 with recent exacerbation  Rationale for Evaluation and Treatment: Rehabilitation  SUBJECTIVE:                                                                                                                                                                                           SUBJECTIVE STATEMENT: Pt comes in wearing flat knit sleeve and glove that appear to fit well  EVAL  She sees and feels the arm more swollen, the chest and her back. She wants to have it checked to  be sure . The arm feels heavy and she has mild pain in Her entire left arm. It feels different that her normal pain, like its in the bone. Her sleeve is about 46 months old now. She has a night garment, but she doesn't wear it every night because her arm feels trapped. She gets tired of it because it is annoying. Sometimes she doesn' wear the glove when doing housework, dishes etc. I still feel pulling in my armpit with reaching.  PERTINENT HISTORY:  Patient was diagnosed on 01/23/2020 with left grade III invasive ductal carcinoma breast cancer. It is ER positive, PR negative, and HER2 negative ,  but functionally Triple Negative,with a Ki67 of 40%. Patient with interpreter present. She underwent neoadjuvant chemotherapy from 02/12/2020 - 05/28/2020. 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  She underwent a left lumpectomy and sentinel node biopsy ( 4 negative nodes) on 07/15/2020. She underwent radiation and anti-estrogen therapy.  She had interval development of diffuse skin thickening in 2023. She had a punch biopsy of her skin at that time and this was consistent with inflammatory breast cancer. She has been treated with chemo since then. She had a left modified radical mastectomy on 08/22/2022 with complete therapeutic response and 0/1 LN. She noted some swelling in her left arm and on 08/25/2022 she had an US  that was negative for DVT. Her SOZO screen on 09/19/2021 was 6.9 points above baseline putting her in the yellow zone and she was given a compression sleeve that was not containing  her so a flat knit sleeve and glove and nighttime garment  have been ordered   PAIN:  Are you having pain? Not presently, always has soreness, but does not have pain.  NPRS scale: 0/10 Pain location: left arm Pain orientation: Left  PAIN TYPE: aching Pain description: intermittent  Aggravating factors: can't sleep on left side Relieving factors: laying down flat  PRECAUTIONS: lymphedema [name]   RED FLAGS: Bowel or bladder incontinence: No   WEIGHT BEARING RESTRICTIONS: No  FALLS:  Has patient fallen in last 6 months? No  LIVING ENVIRONMENT: Lives with: lives with their family and lives with their daughters, 54, 74, 76 Lives in: House/apartment Stairs: No;  Has following equipment at home: None  OCCUPATION: No  LEISURE: walking,  HAND DOMINANCE: right   PRIOR LEVEL OF FUNCTION: Independent  PATIENT GOALS: To see if her arm is getting bigger, she can't reach her back to massage.   OBJECTIVE: Note: Objective measures were completed at Evaluation unless otherwise  noted.  COGNITION: Overall cognitive status: Within functional limits for tasks assessed   PALPATION:   OBSERVATIONS / OTHER ASSESSMENTS: increased swelling at left chest/axilla and left lateral trunk and back, No pitting edema in arm  SENSATION: Light touch: Deficits     POSTURE: Forward head, rounded shoulders UPPER EXTREMITY AROM/PROM:  A/PROM RIGHT   eval   Shoulder extension   Shoulder flexion 155  Shoulder abduction 175  Shoulder internal rotation   Shoulder external rotation     (Blank rows = not tested)  A/PROM LEFT   eval Left 12/11/2023 LEFT 01/02/24  Shoulder extension     Shoulder flexion 149 155 167  Shoulder abduction 150 165   Shoulder internal rotation     Shoulder external rotation       (Blank rows = not tested)  CERVICAL AROM: All within normal limits:      UPPER EXTREMITY STRENGTH:   SURGERY TYPE/DATE: Left Lumpectomy with SLNB11/11/2019, Left modified mastectomy with left axillary node dissection 08/22/2022 NUMBER OF LYMPH NODES REMOVED: 0/4 with lumpectomy, 0/1 with Mastectomy   CHEMOTHERAPY: YES  RADIATION:YES  HORMONE TREATMENT:   INFECTIONS: IBC   LYMPHEDEMA ASSESSMENTS:   LANDMARK RIGHT  eval  At axilla  39.0  15 cm proximal to olecranon process   10 cm proximal to olecranon process 34.3  Olecranon process 28.2  15 cm proximal to ulnar styloid process 28.4  10 cm proximal to ulnar styloid process 26.9  Just proximal to ulnar styloid process 18.8  Across hand at thumb web space 20.5  At base of 2nd digit 6.35  (Blank rows = not tested)  LANDMARK LEFT  eval  At axilla  40.4  15 cm proximal to olecranon process   10 cm proximal to olecranon process 34.5  Olecranon process 28.2  15 cm proximal to ulnar styloid process 28.1  10 cm proximal to ulnar styloid process 26.5  Just proximal to ulnar styloid process 18.8  Across hand at thumb web space 20.25  At base of 2nd digit 6.25  (Blank rows = not  tested)   FUNCTIONAL TESTS:    GAIT: WNL   L-DEX LYMPHEDEMA SCREENING: The patient was assessed using the L-Dex machine today to produce a lymphedema index baseline score. The patient will be reassessed on a regular basis (typically every 3 months) to obtain new L-Dex scores. If the score is > 6.5 points away from his/her baseline score indicating onset of subclinical lymphedema, it will be recommended to wear a compression garment for 4 weeks, 12 hours per day and then be reassessed. If the score continues to be > 6.5 points from baseline at reassessment, we will initiate lymphedema treatment. Assessing in this manner has a 95% rate of preventing clinically significant lymphedema.  QUICK DASH SURVEY:   Lymphedema Life Impact Scale; 44%                                                                                                                            TREATMENT DATE:  01/02/24 Assessed pt's progress towards goals in therapy Pt given info for compression pump company and info was sent last week Manual lymph drainage in supine as follows: short neck, right axillary nodes, left inguinal nodes, superficial and deep abdominals; anterior inter-axillary anastamoses, left axillo-inguinal anastamoses. Left upper extremity from fingers and dorsal hand to lateral shoulder redirecting along pathways.  12/28/2023: Began with sitting ROM for neck, thorax and UE for lymphatic flow Manual lymph drainage in supine as follows: short neck, 5 diaphragmatic breaths,  right axillary nodes, left inguinal nodes; anterior inter-axillary anastamoses, left axillo-inguinal anastomoses then left upper extremity from fingers and dorsal hand to lateral shoulder working proximal to distal then retracing steps redirecting along pathways. Then to R sidelying to L lateral trunk moving fluid along axillo inguinal anastomosis and posterior inter axillary anastomosis and retraced pathways in supine ending with R axillary  nodes and L inguinal nodes. Extra time spent in sidelying with addition of remedial exercises with hand pointed to ceiling  Pt able to don sleeve and glove without difficulty at end of session  12/26/2023: Manual lymph drainage in supine as follows: short neck, 5 diaphragmatic breaths,  right axillary nodes, left inguinal nodes; anterior inter-axillary anastamoses, left axillo-inguinal anastomoses then left upper extremity from fingers and dorsal hand to lateral shoulder working proximal to distal then retracing steps redirecting along pathways. Then to R sidelying to L lateral trunk moving fluid along axillo inguinal anastomosis and posterior inter axillary anastomosis and retraced pathways in supine ending with R axillary nodes and L inguinal nodes. Extra time spent in sidelying with addition of remedial exercises with hand pointed to ceiling   12/20/23: Discussed ability to measure for off the shelf to check to see  if one would fit so she would have a backup in case she is unable to get financial assistance for custom. Awaiting a return call from a company that may be able to assist pt with obtaining custom garment.  Manual lymph drainage in supine as follows: short neck, 5 diaphragmatic breaths,  right axillary nodes, left inguinal nodes; anterior inter-axillary anastamoses, left axillo-inguinal anastomoses then left upper extremity from fingers and dorsal hand to lateral shoulder working proximal to distal then retracing steps redirecting along pathways. Then to R sidelying to L lateral trunk moving fluid along axillo inguinal anastomosis and posterior inter axillary anastomosis and retraced pathways in supine ending with R axillary nodes and L inguinal nodes.   12/18/23: Manual lymph drainage in supine as follows: short neck, 5 diaphragmatic breaths,  right axillary nodes, left inguinal nodes; anterior inter-axillary anastamoses, left axillo-inguinal anastomoses then left upper extremity from fingers and  dorsal hand to lateral shoulder working proximal to distal then retracing steps redirecting along pathways. Then to R sidelying to L lateral trunk moving fluid along axillo inguinal anastomosis and posterior inter axillary anastomosis and retraced pathways in supine ending with R axillary nodes and L inguinal nodes.   12/13/2023 Pt late Discussion with pt and  interpreter, Alba about compression sleeve. Pt wants one that pulls up higher but explained there aren't any that pull up over the top of the shoulder. She is concerned that the top of her sleeve may be too tight. Being measured today by Dorisann Garre for new garments but explained I will need to determine how and where to order from with her insurance Also discussed compression pump;advised we are waiting for MD script before it can be ordered, but all other information has been sent and the hope is it will be approved .  In supine: Short neck, superficial and deep abdominals, Activated R axillary nodes and left axillary LN's and establishment of interaxillary pathway, L inguinal nodes and establishment of axilloinguinal pathway, then L chest and UE working proximal to distal, moving fluid from upper inner arm outwards, and doing both sides of forearm moving fluid towards pathways spending extra time in any areas of fibrosis then retracing all steps. Also performed axillo inguinal pathway in S-L and worked on swelling at upper back in SL across posterior interaxillary anastomosis.  12/11/2023 Overhead pulleys 2 min flexion and abduction In supine: Short neck, superficial and deep abdominals, Activated R axillary nodes and left axillary LN's and establishment of interaxillary pathway, L inguinal nodes and establishment of axilloinguinal pathway, then L chest and UE working proximal to distal, moving fluid from upper inner arm outwards, and doing both sides of forearm moving fluid towards pathways spending extra time in any areas of fibrosis then retracing all  steps. Also performed axillo inguinal pathway in S-L and worked on swelling at upper back in SL across posterior interaxillary anastomosis. Had pt practice neck, anterior interaxillary pathway. And UE with VC's and TC's for proper pressure and to slow down. Lengthy discussion with pt via interpreter about importance of wearing sleeve while performing household chores as that is when she is more likely to swell. It is OK to wear night garment in the daytime but hshe would take it off to do her work and then put it on, and later when doing more chores would remove it again. Discussed wearing daytime sleeve and glove, and wearing food service type glove to cover her glove or remove glove temporarily when preparing food and then put back  on. Pt verbalized understanding. Pt is also agreeable to let me send demographics and treatment note to [facility] to look into compression pump for her.  12/07/2023 Pt with questions about lymphedema and if her arm will continue to get bigger. Explained management of lymphedema and why it is important and went over measurements with her. Explained that her arm is doing quite well, but she does require new garments as they are nearly 63 months old. We will also try to get her a Compression pump from [facility]. We will start with MLD to the chest, trunk and arm and review with her.. Encourage night garment as much as possible.She will also benefit from some stretches for her left shoulder due to tightness in the chest and axilla.   PATIENT EDUCATION:  Education details: lymphedema, need for new garments, will put in for compression pump again Person educated: Patient, interpreter Education method: Explanation Education comprehension: verbalized understanding  HOME EXERCISE PROGRAM: None given today  ASSESSMENT:  CLINICAL IMPRESSION: Assessed progress towards goals in therapy. She has met most goals for therapy. Her left shoulder ROM has improved since eval and her  swelling has been decreasing. She still does not wear her sleeve 100 percent of the time but has improved compliance. Her info was sent to [facility] for a compression pump and pt was given info to reach out to them to follow up on her pump. She does not want to purchase a new custom flat knit sleeve out of pocket at this time. Her current sleeve and glove fit well especially since her swelling has decreased some. Pt will be discharged from skilled PT services at this time.  Addendum; 01/09/2024 Final measurements at discharge forgotten; pt was 2 days shy of 4 weeks of conservative treatment including compression, elevation and exercise. There continues to be mild fibrosis in the forearm, and pt continues to have complaints of chest and trunk swelling. Pt will benefit from compression pump to address trunk, chest and UE swelling. She does not have insurance or any other vendors to assist with paying for new garments as she has a [employer] plan that pays for PT only.  OBJECTIVE IMPAIRMENTS: decreased activity tolerance, decreased knowledge of condition, decreased ROM, increased edema, impaired flexibility, impaired UE functional use, postural dysfunction, and pain.   ACTIVITY LIMITATIONS: carrying, sleeping, and reach over head  PARTICIPATION LIMITATIONS:  pt is doing what is required of her with some restrictions with activities requiring reaching overhead.  PERSONAL FACTORS: 3+ comorbidities: Left breast cancer s/p 2 surgeries,chemo and radiation  are also affecting patient's functional outcome.   REHAB POTENTIAL: Good  CLINICAL DECISION MAKING: Stable/uncomplicated  EVALUATION COMPLEXITY: Low  GOALS: Goals reviewed with patient? Yes  SHORT TERM GOALS: Target date: 12/28/2023  Pt will be independent in MLD to the left UE, chest, lateral trunk Baseline: Goal status: MET 01/02/24  2.  Pt will be independent in HEP to improve left shoulder ROM/strength Baseline:  Goal status: MET  01/02/24  3.  Pt will note decreased left UE achiness by 25% Baseline:  Goal status: MET 01/02/24  4.  Pt will be compliant with compression bra to reduce trunk edema Baseline:  Goal status: MET 01/02/24    LONG TERM GOALS: Target date: 01/18/2024  Pt will be approved for a sequential compression pump to decrease left trunk/arm lymphedema Baseline:  Goal status: MET 01/02/24- [facility] - pt given phone number to follow up with  2.  Pt  will have achiness in eft arm reduced by 50% or greater Baseline:  Goal status: MET 01/02/24  3.  Pt will have left shoulder abd atleast 165 degrees for improved reaching Baseline:  Goal status: MET 01/02/24  4.  Pt will note decreased chest and trunk edema by 50% Baseline:  Goal status: Partially met 30% decrease  5.  Pt will have new, appropriate compression garments to maintain left UE edema Baseline:  Goal status: NOT MET - pt unable to purchase another custom sleeve at this time but current sleeve fits well and she is using it   PLAN:  PT FREQUENCY: 2x/week  PT DURATION: 6 weeks  PLANNED INTERVENTIONS: 97164- PT Re-evaluation, 97110-Therapeutic exercises, 97530- Therapeutic activity, 97112- Neuromuscular re-education, 97535- Self Care, 40981- Manual therapy, 97760- Orthotic Fit/training, and Patient/Family education  PLAN FOR NEXT SESSION: d/c this session Vivica Grounds, order form sent to MD for signature(12/13/2023)   Deedee Farmer, PT 01/02/2024, 10:54 AM [facility] [address] [place], [place], [address] Phone: [phone]   Fax:  [phone]   PHYSICAL THERAPY DISCHARGE SUMMARY  Visits from Start of Care: 8  Current functional level related to goals / functional outcomes: Pt goals mostly met - see above   Remaining deficits: Still has some increased lymphedema in [name] but has not been 100 percent compliant with sleeve   Education / Equipment: Need for  compression 24/7, info on pump and contact info   Patient agrees to discharge. Patient goals were partially met. Patient is being discharged due to being pleased with the current functional level.  Evelyn Hire [place], [place] 01/02/24 11:05 AM

## 2024-01-03 ENCOUNTER — Ambulatory Visit (INDEPENDENT_AMBULATORY_CARE_PROVIDER_SITE_OTHER): Payer: Self-pay | Admitting: Plastic Surgery

## 2024-01-03 ENCOUNTER — Encounter: Payer: Self-pay | Admitting: Plastic Surgery

## 2024-01-03 VITALS — BP 116/78 | HR 83 | Ht 61.0 in | Wt 192.0 lb

## 2024-01-03 DIAGNOSIS — Z923 Personal history of irradiation: Secondary | ICD-10-CM

## 2024-01-03 DIAGNOSIS — Z853 Personal history of malignant neoplasm of breast: Secondary | ICD-10-CM

## 2024-01-03 DIAGNOSIS — N651 Disproportion of reconstructed breast: Secondary | ICD-10-CM

## 2024-01-03 NOTE — Progress Notes (Signed)
 Referring Provider [name], NP [address] [place],  [place] [address]   CC:  Chief Complaint  Patient presents with   Advice Only   Skin Problem      [name] is an 48 y.o. female.  HPI: [name] is a 48 year old female who underwent a mastectomy in December 2023 for a left breast cancer.  She underwent neoadjuvant chemotherapy adjuvant radiation therapy and adjuvant chemotherapy.  She is referred today for consideration of reconstruction.  Patient states that she would like to be similar to the size of her right native breast.  Allergies  Allergen Reactions   Omnipaque  [Iohexol ] Other (See Comments)    Throat itching, some SOB and some chest pain post administration    Doxorubicin  Hcl Rash    Experience chest tightness, abdominal pain, and redness around lips during and shortly after doxorubicin  bolus.    Keytruda  [Pembrolizumab ] Other (See Comments)    Lung irritation; skin breakout; swelling   Other Itching    Almonds, cherries, peaches   Tape     Redness    Wound Dressing Adhesive     Outpatient Encounter Medications as of 01/03/2024  Medication Sig   metFORMIN  (GLUCOPHAGE -XR) 500 MG 24 hr tablet Take 1 tablet (500 mg total) by mouth 2 (two) times daily with a meal.   phentermine  (ADIPEX-P ) 37.5 MG tablet Take 1 tablet (37.5 mg total) by mouth daily before breakfast.   tamoxifen  (NOLVADEX ) 20 MG tablet Take 1 tablet (20 mg total) by mouth daily.   Vitamin D , Ergocalciferol , (DRISDOL ) 1.25 MG (50000 UNIT) CAPS capsule Take 1 capsule by mouth once a week   simvastatin  (ZOCOR ) 20 MG tablet Take 1 tablet (20 mg total) by mouth every evening.   No facility-administered encounter medications on file as of 01/03/2024.     Past Medical History:  Diagnosis Date   Allergy     Asthma    Back pain    Breast cancer in female [facility])    Left   Breast disorder    breast cancer May 2021   Chronic back pain    Fatty liver    Headache    High  triglycerides    History of breast cancer    History of COVID-19 04/20/2021   Hx of migraines    Morbid obesity (HCC)    Personal history of chemotherapy    Personal history of radiation therapy    SOB (shortness of breath)    Vitamin D  deficiency     Past Surgical History:  Procedure Laterality Date   BREAST LUMPECTOMY     BREAST LUMPECTOMY WITH RADIOACTIVE SEED AND SENTINEL LYMPH NODE BIOPSY Left 07/15/2020   Procedure: LEFT BREAST LUMPECTOMY WITH RADIOACTIVE SEED AND LEFT AXILLARY SENTINEL LYMPH NODE BIOPSY, LEFT AXILLARY NODE RADIOACTIVE SEED GUIDED EXCISION, LEFT BREAST RADIOACTIVE SEED GUIDED EXCISION IM NODE;  Surgeon: Enid Harry, MD;  Location: [facility];  Service: General;  Laterality: Left;  PEC BLOCK   CESAREAN SECTION WITH BILATERAL TUBAL LIGATION Bilateral 07/23/2015   Procedure: CESAREAN SECTION WITH BILATERAL TUBAL LIGATION;  Surgeon: Granville Layer, MD;  Location: [facility];  Service: Obstetrics;  Laterality: Bilateral;   IR IMAGING GUIDED PORT INSERTION  02/11/2022   MASTECTOMY Left    MODIFIED MASTECTOMY Left 08/22/2022   Procedure: LEFT MODIFIED RADICAL MASTECTOMY, LEFT AXILLARY NODE DISSECTION, AND SKIN BIOPSY OF BACK;  Surgeon: Enid Harry, MD;  Location: [facility];  Service: General;  Laterality: Left;  PORT-A-CATH REMOVAL N/A 04/30/2021   Procedure: REMOVAL PORT-A-CATH;  Surgeon: Enid Harry, MD;  Location: [facility];  Service: General;  Laterality: N/A;   PORTACATH PLACEMENT Right 02/11/2020   Procedure: INSERTION PORT-A-CATH WITH ULTRASOUND GUIDANCE;  Surgeon: Enid Harry, MD;  Location: [facility];  Service: General;  Laterality: Right;   TUBAL LIGATION      Family History  Problem Relation Age of Onset   Heart disease Maternal Grandmother     Social History   Social History Narrative   Pt was born in [place]     Review of Systems General: Denies fevers, chills, weight loss CV: Denies chest pain,  shortness of breath, palpitations Breast: History of left breast cancer.  Breast is surgically absent and she is interested in reconstruction  Physical Exam    01/03/2024    8:47 AM 11/21/2023   12:38 PM 10/30/2023    9:00 AM  Vitals with BMI  Height 5\' 1"  5\' 1"  5\' 1"   Weight 192 lbs 190 lbs 186 lbs  BMI 36.3 35.92 35.16  Systolic 116 125 161  Diastolic 78 68 57  Pulse 83  81    General:  No acute distress,  Alert and oriented, Non-Toxic, Normal speech and affect Left breast: Patient's left breast is surgically absent.  She has thickened skin which is densely adherent to the chest wall.  There is erythema around the adherent skin.  No palpable masses at the surgical site Mammogram: Ongoing evaluation secondary to breast cancer Assessment/Plan Left breast reconstruction: Patient underwent 29 rounds of radiation therapy as part of her treatment for left breast cancer.  Her skin is thickened and densely adherent to the left chest wall and reconstruction with tissue expanders and implants is simply not possible.  I discussed this with her through her translator.  She understands.  Patient's BMI is 35 however she may be a candidate for free tissue transfer for reconstruction.  I have discussed this with her in the most general terms and she is interested and at least getting some more information.  I told her that Dr. Orin Birk and I both refer our patients to [facility] for evaluation for free tissue transfer and breast reconstruction.  She is interested in having a consult.  Will place the referral today.  Photographs are in the chart.  I have told the patient that I would be willing to see her for routine follow-up if she elects to undergo the DIEP flap reconstruction.  Teretha Ferguson 01/03/2024, 9:12 AM

## 2024-01-04 ENCOUNTER — Encounter: Admitting: Physical Therapy

## 2024-01-08 ENCOUNTER — Inpatient Hospital Stay: Payer: No Typology Code available for payment source | Attending: Hematology and Oncology

## 2024-01-08 DIAGNOSIS — Z452 Encounter for adjustment and management of vascular access device: Secondary | ICD-10-CM | POA: Insufficient documentation

## 2024-01-08 DIAGNOSIS — Z95828 Presence of other vascular implants and grafts: Secondary | ICD-10-CM

## 2024-01-08 DIAGNOSIS — C50412 Malignant neoplasm of upper-outer quadrant of left female breast: Secondary | ICD-10-CM | POA: Insufficient documentation

## 2024-01-08 MED ORDER — HEPARIN SOD (PORK) LOCK FLUSH 100 UNIT/ML IV SOLN
500.0000 [IU] | Freq: Once | INTRAVENOUS | Status: AC
  Administered 2024-01-08: 500 [IU]

## 2024-01-08 MED ORDER — SODIUM CHLORIDE 0.9% FLUSH
10.0000 mL | Freq: Once | INTRAVENOUS | Status: AC
  Administered 2024-01-08: 10 mL

## 2024-01-09 ENCOUNTER — Encounter: Admitting: Physical Therapy

## 2024-01-11 ENCOUNTER — Encounter

## 2024-01-16 ENCOUNTER — Encounter: Admitting: Physical Therapy

## 2024-01-18 ENCOUNTER — Encounter: Admitting: Physical Therapy

## 2024-02-21 ENCOUNTER — Encounter: Payer: Self-pay | Admitting: Hematology and Oncology

## 2024-02-21 ENCOUNTER — Other Ambulatory Visit (HOSPITAL_COMMUNITY): Payer: Self-pay

## 2024-02-21 ENCOUNTER — Other Ambulatory Visit: Payer: Self-pay | Admitting: *Deleted

## 2024-02-21 MED ORDER — PREDNISONE 50 MG PO TABS
ORAL_TABLET | ORAL | 1 refills | Status: DC
  Filled 2024-02-21: qty 3, 2d supply, fill #0

## 2024-02-22 ENCOUNTER — Ambulatory Visit (HOSPITAL_COMMUNITY)
Admission: RE | Admit: 2024-02-22 | Discharge: 2024-02-22 | Disposition: A | Discharge status: Home / Self Care | Payer: Self-pay | Source: Ambulatory Visit | Attending: Hematology and Oncology | Admitting: Hematology and Oncology

## 2024-02-22 DIAGNOSIS — Z170 Estrogen receptor positive status [ER+]: Secondary | ICD-10-CM | POA: Insufficient documentation

## 2024-02-22 DIAGNOSIS — C50412 Malignant neoplasm of upper-outer quadrant of left female breast: Secondary | ICD-10-CM | POA: Insufficient documentation

## 2024-02-22 MED ORDER — SODIUM CHLORIDE (PF) 0.9 % IJ SOLN
INTRAMUSCULAR | Status: AC
  Filled 2024-02-22: qty 50

## 2024-02-22 MED ORDER — IOHEXOL 300 MG/ML  SOLN
100.0000 mL | Freq: Once | INTRAMUSCULAR | Status: AC | PRN
  Administered 2024-02-22: 100 mL via INTRAVENOUS

## 2024-02-26 ENCOUNTER — Other Ambulatory Visit: Payer: Self-pay | Admitting: *Deleted

## 2024-02-26 ENCOUNTER — Telehealth: Payer: Self-pay

## 2024-02-26 DIAGNOSIS — N6325 Unspecified lump in the left breast, overlapping quadrants: Secondary | ICD-10-CM

## 2024-02-26 NOTE — Telephone Encounter (Signed)
 Verbally confirmed appt for 6/17

## 2024-02-27 ENCOUNTER — Inpatient Hospital Stay (HOSPITAL_BASED_OUTPATIENT_CLINIC_OR_DEPARTMENT_OTHER)
Payer: No Typology Code available for payment source | Attending: Hematology and Oncology | Admitting: Hematology and Oncology

## 2024-02-27 ENCOUNTER — Inpatient Hospital Stay: Payer: No Typology Code available for payment source | Attending: Hematology and Oncology

## 2024-02-27 VITALS — BP 125/90 | HR 86 | Temp 97.9°F | Resp 17 | Wt 189.2 lb

## 2024-02-27 DIAGNOSIS — C50412 Malignant neoplasm of upper-outer quadrant of left female breast: Secondary | ICD-10-CM

## 2024-02-27 DIAGNOSIS — E559 Vitamin D deficiency, unspecified: Secondary | ICD-10-CM | POA: Insufficient documentation

## 2024-02-27 DIAGNOSIS — Z1722 Progesterone receptor negative status: Secondary | ICD-10-CM | POA: Insufficient documentation

## 2024-02-27 DIAGNOSIS — Z1732 Human epidermal growth factor receptor 2 negative status: Secondary | ICD-10-CM | POA: Insufficient documentation

## 2024-02-27 DIAGNOSIS — Z170 Estrogen receptor positive status [ER+]: Secondary | ICD-10-CM | POA: Insufficient documentation

## 2024-02-27 DIAGNOSIS — Z452 Encounter for adjustment and management of vascular access device: Secondary | ICD-10-CM | POA: Insufficient documentation

## 2024-02-27 DIAGNOSIS — E538 Deficiency of other specified B group vitamins: Secondary | ICD-10-CM | POA: Insufficient documentation

## 2024-02-27 DIAGNOSIS — Z8616 Personal history of COVID-19: Secondary | ICD-10-CM | POA: Insufficient documentation

## 2024-02-27 DIAGNOSIS — Z9221 Personal history of antineoplastic chemotherapy: Secondary | ICD-10-CM | POA: Insufficient documentation

## 2024-02-27 DIAGNOSIS — E7800 Pure hypercholesterolemia, unspecified: Secondary | ICD-10-CM | POA: Insufficient documentation

## 2024-02-27 DIAGNOSIS — Z923 Personal history of irradiation: Secondary | ICD-10-CM | POA: Insufficient documentation

## 2024-02-27 DIAGNOSIS — K760 Fatty (change of) liver, not elsewhere classified: Secondary | ICD-10-CM | POA: Insufficient documentation

## 2024-02-27 DIAGNOSIS — N6325 Unspecified lump in the left breast, overlapping quadrants: Secondary | ICD-10-CM

## 2024-02-27 DIAGNOSIS — Z9013 Acquired absence of bilateral breasts and nipples: Secondary | ICD-10-CM | POA: Insufficient documentation

## 2024-02-27 DIAGNOSIS — Z95828 Presence of other vascular implants and grafts: Secondary | ICD-10-CM

## 2024-02-27 DIAGNOSIS — R0789 Other chest pain: Secondary | ICD-10-CM | POA: Insufficient documentation

## 2024-02-27 LAB — CBC WITH DIFFERENTIAL (CANCER CENTER ONLY)
Abs Immature Granulocytes: 0.05 10*3/uL (ref 0.00–0.07)
Basophils Absolute: 0 10*3/uL (ref 0.0–0.1)
Basophils Relative: 0 %
Eosinophils Absolute: 0.1 10*3/uL (ref 0.0–0.5)
Eosinophils Relative: 2 %
HCT: 40.6 % (ref 36.0–46.0)
Hemoglobin: 13.5 g/dL (ref 12.0–15.0)
Immature Granulocytes: 1 %
Lymphocytes Relative: 32 %
Lymphs Abs: 1.7 10*3/uL (ref 0.7–4.0)
MCH: 27.6 pg (ref 26.0–34.0)
MCHC: 33.3 g/dL (ref 30.0–36.0)
MCV: 83 fL (ref 80.0–100.0)
Monocytes Absolute: 0.3 10*3/uL (ref 0.1–1.0)
Monocytes Relative: 5 %
Neutro Abs: 3.2 10*3/uL (ref 1.7–7.7)
Neutrophils Relative %: 60 %
Platelet Count: 186 10*3/uL (ref 150–400)
RBC: 4.89 MIL/uL (ref 3.87–5.11)
RDW: 13.5 % (ref 11.5–15.5)
WBC Count: 5.3 10*3/uL (ref 4.0–10.5)
nRBC: 0 % (ref 0.0–0.2)

## 2024-02-27 LAB — CMP (CANCER CENTER ONLY)
ALT: 15 U/L (ref 0–44)
AST: 16 U/L (ref 15–41)
Albumin: 4.3 g/dL (ref 3.5–5.0)
Alkaline Phosphatase: 85 U/L (ref 38–126)
Anion gap: 8 (ref 5–15)
BUN: 14 mg/dL (ref 6–20)
CO2: 27 mmol/L (ref 22–32)
Calcium: 9.5 mg/dL (ref 8.9–10.3)
Chloride: 104 mmol/L (ref 98–111)
Creatinine: 0.79 mg/dL (ref 0.44–1.00)
GFR, Estimated: >60 mL/min
Glucose, Bld: 124 mg/dL — ABNORMAL HIGH (ref 70–99)
Potassium: 3.7 mmol/L (ref 3.5–5.1)
Sodium: 139 mmol/L (ref 135–145)
Total Bilirubin: 0.4 mg/dL (ref 0.0–1.2)
Total Protein: 7.2 g/dL (ref 6.5–8.1)

## 2024-02-27 MED ORDER — ALBUTEROL SULFATE HFA 108 (90 BASE) MCG/ACT IN AERS: 2.0000 | INHALATION_SPRAY | Freq: Four times a day (QID) | RESPIRATORY_TRACT | 2 refills | Status: AC | PRN

## 2024-02-27 MED ORDER — HEPARIN SOD (PORK) LOCK FLUSH 100 UNIT/ML IV SOLN
250.0000 [IU] | Freq: Once | INTRAVENOUS | Status: AC
  Administered 2024-02-27: 250 [IU]

## 2024-02-27 MED ORDER — SODIUM CHLORIDE 0.9% FLUSH
10.0000 mL | Freq: Once | INTRAVENOUS | Status: AC
  Administered 2024-02-27: 10 mL

## 2024-02-27 NOTE — Progress Notes (Signed)
 [facility] Cancer Follow up:    [name], NP [address] [place] [address]   DIAGNOSIS:  Cancer Staging  Malignant neoplasm of upper-outer quadrant of left breast in female, estrogen receptor positive (HCC) Staging form: Breast, AJCC 8th Edition - Clinical stage from 01/29/2020: Stage IIIA (cT2, cN1, cM0, G3, ER+, PR-, HER2-) - Signed by Murleen Arms, MD on 06/28/2022 Stage prefix: Initial diagnosis Histologic grading system: 3 grade system   SUMMARY OF ONCOLOGIC HISTORY: Oncology History  Malignant neoplasm of upper-outer quadrant of left breast in female, estrogen receptor positive (HCC)  01/23/2020 Initial Diagnosis   [name] woman status post left breast upper outer quadrant biopsy 01/23/2020 for a clinical T3 N1, 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             (a) chest CT scan and bone scan 02/07/2020 showed no evidence of metastatic disease   01/29/2020 Cancer Staging   Staging form: Breast, AJCC 8th Edition - Clinical stage from 01/29/2020: Stage IIIA (cT2, cN1, cM0, G3, ER+, PR-, HER2-) - Signed by Murleen Arms, MD on 06/28/2022 Stage prefix: Initial diagnosis Histologic grading system: 3 grade system   02/07/2020 Genetic Testing   Negative genetic testing. No pathogenic variants identified on the [facility] Common Hereditary Cancers Panel. VUS in MSH6 called c.2744C>G identified. The report date is 02/07/2020.  The Common Hereditary Cancers Panel offered by [facility] includes sequencing and/or deletion duplication testing of the following 48 genes: APC, ATM, AXIN2, BARD1, BMPR1A, BRCA1, BRCA2, BRIP1, CDH1, CDKN2A (p14ARF), CDKN2A (p16INK4a), CKD4, CHEK2, CTNNA1, DICER1, EPCAM (Deletion/duplication testing only), GREM1 (promoter region deletion/duplication testing only), KIT, MEN1, MLH1, MSH2, MSH3, MSH6, MUTYH, NBN, NF1, NHTL1, PALB2, PDGFRA, PMS2, POLD1, POLE, PTEN, RAD50,  RAD51C, RAD51D, RNF43, SDHB, SDHC, SDHD, SMAD4, SMARCA4. STK11, TP53, TSC1, TSC2, and VHL.  The following genes were evaluated for sequence changes only: SDHA and HOXB13 c.251G>A variant only.  UPDATE: MSH6 c.2744C>G (p.Ala915Gly) VUS has been amended to Benign. Amended report date is 02/13/2023.   02/12/2020 - 05/28/2020 Neo-Adjuvant Chemotherapy   neoadjuvant chemotherapy consisting of doxorubicin  and cyclophosphamide  in dose dense fashion x4 started 02/12/2020, completed 03/25/2020, followed by paclitaxel  and carboplatin  weekly x12 started 04/08/2020 and completed on 05/28/2020             (a) echo 02/07/2020 shows an ejection fraction in the 55-60% range             (b) Epirubicin  substituted for Doxorubicin  starting with cycle 2 of AC secondary to allergic rash from Doxorubicin    07/15/2020 Surgery   status post left lumpectomy and sentinel lymph node sampling 07/15/2020 for a ypT1b ypN0 residual invasive ductal carcinoma, grade 3, with negative margins.             (a) a total of 4 left axillary lymph nodes were removed             (b) repeat prognostic panel on the final pathology again triple negative, with an MIB-1 of 50%.   08/26/2020 - 10/09/2020 Radiation Therapy   adjuvant radiation completed 10/09/2020             (a) sensitizing capecitabine  attempted but not tolerated by the patient  08/26/2020 through 10/09/2020 Site Technique Total Dose (Gy) Dose per Fx (Gy) Completed Fx Beam Energies  Breast, Left: Breast_Lt 3D 50/50 2 25/25 15X  Breast, Left: Breast_Lt_PAB_SCV 3D 50/50 2 25/25 10X, 15X  Breast, Left:  Breast_Lt_Bst 3D 10/10 2 5/5 6X, 10X     11/23/2020 - 01/04/2021 Adjuvant Chemotherapy   adjuvant pembrolizumab  started 11/23/2020, to continue for 1 year             (a) discontinued after 01/04/2021 dose with multiple side effects requiring steroids for resolution   03/30/2022 Mammogram   Mammogram and ultrasound show numerous masses throughout left breast not visualized in  01/2022, +left axillary lymphadenopathy, skin thickening in right breast and in upper abdomen just below sternum   04/05/2022 - 07/20/2022 Chemotherapy   Patient is on Treatment Plan : BREAST METASTATIC Fam-Trastuzumab Deruxtecan-nxki  (Enhertu ) (5.4) q21d     08/21/2022 Surgery   Left breast mastectomy and lymph node biopsy shows no residual carcinoma.     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  Prognostic panel sent to [facility]: Left breast skin, punch biopsy: Skin with dermal lymphatic involvement by carcinoma (confirmed with outside CK7 IHC, reviewed). Biomarkers are reported as negative for ER, PR, Her2/neu (confirmed on review but please note sample size is extremely small).   02/15/2022 - 03/08/2022 Chemotherapy    Taxotere /Cytoxan  x 2, discontinued due to progression of breast.   03/30/2022 Mammogram   Mammogram and ultrasound show numerous masses throughout left breast not visualized in 01/2022, +left axillary lymphadenopathy, skin thickening in right breast and in upper abdomen just below sternum   04/05/2022 - 07/20/2022 Chemotherapy   Patient is on Treatment Plan : BREAST METASTATIC Fam-Trastuzumab Deruxtecan-nxki  (Enhertu ) (5.4) q21d     11/25/2022 -  Chemotherapy   Patient is on Treatment Plan : BREAST Capecitabine  q21d      She completed 8 cycles of adj xeloda .  CURRENT THERAPY: None  INTERVAL HISTORY: Discussed the use of AI scribe software for clinical note transcription with the patient, who gave verbal consent to proceed.  History of Present Illness [name] is a 48 year old female who presents with chest pain and arm discomfort.  She experiences discomfort in the chest wall area, which sometimes becomes so severe that she is unable to lift her arm. The pain is exacerbated by  bending over and has been present for a couple of weeks. She hardly uses her left arm due to its sensitivity.   She mentions a recent fall where she stepped on a bottle, causing her foot to twist and her back to hit the edge of the car. This incident occurred about a week ago and caused significant pain, but she does not believe it is related to her current chest and arm symptoms.  She describes experiencing shortness of breath at night about a month ago, which lasted for a prolonged period. She used an albuterol  inhaler, which she obtained from a friend, and it eventually helped alleviate her symptoms. She has used inhalers in the past during pollen season and notes that previously, the inhaler provided quicker relief.  She is concerned about persistent inflammation in her arm and back, although a previous biopsy of the back area showed no cancer.  Rest of the pertinent 10 point ROS reviewed and negative  Patient Active Problem List   Diagnosis Date Noted   Prediabetes 08/22/2023   Abnormal craving 08/22/2023   Pure hypercholesterolemia 08/22/2023   Has health insurance with inadequate coverage of health expenses 08/22/2023  Elevated blood sugar 08/08/2023   Metabolic dysfunction-associated steatotic liver disease (MASLD) 08/08/2023   Class 2 severe obesity with serious comorbidity and body mass index (BMI) of 35.0 to 35.9 in adult [facility]) 08/08/2023   Vitamin B12 deficiency 02/03/2023   Vitamin D  deficiency 11/04/2022   Health care maintenance 09/30/2022   S/P left mastectomy 08/22/2022   Burning with urination 06/01/2022   Swelling of left upper extremity 05/18/2022   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) 04/01/2022   Encounter for dental examination 03/11/2022   Acute apical periodontitis 03/11/2022   Phobia of dental procedure 03/11/2022   Defective dental restoration 03/11/2022   Accretions on teeth 03/11/2022   Chronic periodontitis 03/11/2022   Teeth missing 03/11/2022    Caries 03/11/2022   Generalized gingival recession 03/11/2022   Pain, dental 03/08/2022   Cancer-related pain 03/08/2022   Port-A-Cath in place 02/15/2022   Oral allergy  syndrome, subsequent encounter 02/03/2022   Adverse effect of other drugs, medicaments and biological substances, subsequent encounter 02/02/2022   Other adverse food reactions, not elsewhere classified, subsequent encounter 02/02/2022   Chest tightness 02/02/2022   Other allergic rhinitis 02/02/2022   Allergic conjunctivitis of both eyes 02/02/2022   Language barrier 12/11/2020   Morbid obesity (HCC)    History of breast cancer    Genetic testing 02/13/2020   Malignant neoplasm of upper-outer quadrant of left breast in female, estrogen receptor positive (HCC) 01/27/2020   Impaired ability to use community resources due to language barrier 07/13/2013    is allergic to omnipaque  [iohexol ], doxorubicin  hcl, keytruda  [pembrolizumab ], other, tape, and wound dressing adhesive.  MEDICAL HISTORY: Past Medical History:  Diagnosis Date   Allergy     Asthma    Back pain    Breast cancer in female [facility])    Left   Breast disorder    breast cancer May 2021   Chronic back pain    Fatty liver    Headache    High triglycerides    History of breast cancer    History of COVID-19 04/20/2021   Hx of migraines    Morbid obesity (HCC)    Personal history of chemotherapy    Personal history of radiation therapy    SOB (shortness of breath)    Vitamin D  deficiency     SURGICAL HISTORY: Past Surgical History:  Procedure Laterality Date   BREAST LUMPECTOMY     BREAST LUMPECTOMY WITH RADIOACTIVE SEED AND SENTINEL LYMPH NODE BIOPSY Left 07/15/2020   Procedure: LEFT BREAST LUMPECTOMY WITH RADIOACTIVE SEED AND LEFT AXILLARY SENTINEL LYMPH NODE BIOPSY, LEFT AXILLARY NODE RADIOACTIVE SEED GUIDED EXCISION, LEFT BREAST RADIOACTIVE SEED GUIDED EXCISION IM NODE;  Surgeon: Enid Harry, MD;  Location: [facility];  Service: General;   Laterality: Left;  PEC BLOCK   CESAREAN SECTION WITH BILATERAL TUBAL LIGATION Bilateral 07/23/2015   Procedure: CESAREAN SECTION WITH BILATERAL TUBAL LIGATION;  Surgeon: Granville Layer, MD;  Location: [facility];  Service: Obstetrics;  Laterality: Bilateral;   IR IMAGING GUIDED PORT INSERTION  02/11/2022   MASTECTOMY Left    MODIFIED MASTECTOMY Left 08/22/2022   Procedure: LEFT MODIFIED RADICAL MASTECTOMY, LEFT AXILLARY NODE DISSECTION, AND SKIN BIOPSY OF BACK;  Surgeon: Enid Harry, MD;  Location: [facility];  Service: General;  Laterality: Left;   PORT-A-CATH REMOVAL N/A 04/30/2021   Procedure: REMOVAL PORT-A-CATH;  Surgeon: Enid Harry, MD;  Location: [facility];  Service: General;  Laterality: N/A;   PORTACATH PLACEMENT Right 02/11/2020   Procedure: INSERTION PORT-A-CATH WITH  ULTRASOUND GUIDANCE;  Surgeon: Enid Harry, MD;  Location: [facility];  Service: General;  Laterality: Right;   TUBAL LIGATION      SOCIAL HISTORY: Social History   Socioeconomic History   Marital status: Legally Separated    Spouse name: Not on file   Number of children: 3   Years of education: Not on file   Highest education level: 9th grade  Occupational History   Occupation: Holiday representative  Tobacco Use   Smoking status: Never   Smokeless tobacco: Never  Vaping Use   Vaping status: Never Used  Substance and Sexual Activity   Alcohol use: No   Drug use: Never   Sexual activity: Yes    Birth control/protection: Surgical  Other Topics Concern   Not on file  Social History Narrative   Pt was born in [place]   Social Drivers of Health   Financial Resource Strain: Not on file  Food Insecurity: No Food Insecurity (11/21/2023)   Hunger Vital Sign    Worried About Running Out of Food in the Last Year: Never true    Ran Out of Food in the Last Year: Never true  Transportation Needs: No Transportation Needs (11/21/2023)   PRAPARE - Scientist, research (physical sciences) (Medical): No    Lack of Transportation (Non-Medical): No  Physical Activity: Not on file  Stress: Not on file  Social Connections: Not on file  Intimate Partner Violence: Not At Risk (10/11/2022)   Humiliation, Afraid, Rape, and Kick questionnaire    Fear of Current or Ex-Partner: No    Emotionally Abused: No    Physically Abused: No    Sexually Abused: No    FAMILY HISTORY: Family History  Problem Relation Age of Onset   Heart disease Maternal Grandmother     Review of Systems  Constitutional:  Positive for fatigue. Negative for appetite change, chills, fever and unexpected weight change.  HENT:   Negative for hearing loss, lump/mass and trouble swallowing.   Eyes:  Positive for eye problems. Negative for icterus.  Respiratory:  Negative for chest tightness, cough and shortness of breath.   Cardiovascular:  Negative for chest pain, leg swelling and palpitations.  Gastrointestinal:  Negative for abdominal distention, abdominal pain, constipation, diarrhea, nausea and vomiting.  Endocrine: Negative for hot flashes.  Genitourinary:  Negative for difficulty urinating.   Musculoskeletal:  Positive for back pain. Negative for arthralgias.  Skin:  Negative for itching and rash.  Neurological:  Negative for dizziness, extremity weakness, headaches and numbness.  Hematological:  Negative for adenopathy. Does not bruise/bleed easily.  Psychiatric/Behavioral:  Negative for depression. The patient is not nervous/anxious.     PHYSICAL EXAMINATION   She appears well, no distress No regional adenopathy Chest: Left breast s/p mastectomy, No concern for local recurrence. No redness of the skin concerning for inflammatory breast cancer recurrence. No redness on the posterior chest wall CTA bilaterally RRR No LE edema.  LABORATORY DATA:  CBC    Component Value Date/Time   WBC 5.3 02/27/2024 1009   WBC 4.5 08/28/2023 0909   RBC 4.89 02/27/2024 1009   HGB 13.5  02/27/2024 1009   HGB 13.5 08/08/2023 1036   HCT 40.6 02/27/2024 1009   HCT 41.4 08/08/2023 1036   PLT 186 02/27/2024 1009   PLT 193 08/08/2023 1036   MCV 83.0 02/27/2024 1009   MCV 91 08/08/2023 1036   MCH 27.6 02/27/2024 1009   MCHC 33.3 02/27/2024 1009   RDW 13.5  02/27/2024 1009   RDW 12.0 08/08/2023 1036   LYMPHSABS 1.7 02/27/2024 1009   LYMPHSABS 1.3 08/08/2023 1036   MONOABS 0.3 02/27/2024 1009   EOSABS 0.1 02/27/2024 1009   EOSABS 0.2 08/08/2023 1036   BASOSABS 0.0 02/27/2024 1009   BASOSABS 0.0 08/08/2023 1036    CMP     Component Value Date/Time   NA 139 02/27/2024 1009   NA 139 08/08/2023 1036   K 3.7 02/27/2024 1009   CL 104 02/27/2024 1009   CO2 27 02/27/2024 1009   GLUCOSE 124 (H) 02/27/2024 1009   BUN 14 02/27/2024 1009   BUN 12 08/08/2023 1036   CREATININE 0.79 02/27/2024 1009   CALCIUM  9.5 02/27/2024 1009   PROT 7.2 02/27/2024 1009   PROT 6.8 08/08/2023 1036   ALBUMIN 4.3 02/27/2024 1009   ALBUMIN 4.3 08/08/2023 1036   AST 16 02/27/2024 1009   ALT 15 02/27/2024 1009   ALKPHOS 85 02/27/2024 1009   BILITOT 0.4 02/27/2024 1009   GFRNONAA >60 02/27/2024 1009   GFRAA >60 06/03/2020 0949   GFRAA >60 02/26/2020 0915    ASSESSMENT and THERAPY PLAN:   Malignant neoplasm of upper-outer quadrant of left breast in female, estrogen receptor positive (HCC) Loda is a 48 year old Spanish-speaking woman who has a 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  She was most recently seen for ongoing breast redness, given a course of antibiotics but had persistent redness hence we proceeded with a punch biopsy.  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  Pathology was sent to [facility] for second opinion as well. This confirmed triple negative breast cancer. Repeat biopsy demonstrated her HER2 to be 1+.   Given locally advanced and unresectable breast cancer  and the progression on TC ( after 2 cycles) and previous exposure to Adriamycin  based regimen, we decided to try Enhertu  for HER2 low disease and she had robust response.  She is now status post left mastectomy and axillary resection and there is no evidence of residual tumor, complete pathologic response obtained.  Since this is a recurrent breast cancer and high risk for systemic disease, I have discussed about adjuvant Xeloda  based on the create X trial, she is now on adjuvant xeloda .   She has completed 8 cycles of adjuvant Xeloda .  She continues on periodic imaging and signatera testing at this point.  Assessment and Plan Assessment & Plan Chest wall pain Intermittent severe chest wall pain likely due to post-surgical and radiation-induced scar tissue and sensitivity. Imaging ruled out lymphadenopathy, recurrence, or metastasis. Last signatera testing neg, she had another one drawn yesterday, awaiting results Although she is a high risk for recurrence, there seems to be no clear evidence for recurrence at this time. Reassured her, she will stay in touch and call us  back with any worsening of symptoms We will continue periodic imaging every 6 months in her case.  Shortness of breath Resolved shortness of breath, previously managed with albuterol  inhaler. - Prescribed albuterol  inhaler to [facility] at [facility] per pt request. CTA bilaterally today.   All questions were answered. The patient knows to call the clinic with any problems, questions or concerns. We can certainly see the patient much sooner if necessary.   Time spent: 30 min  *Total Encounter Time as defined by the Centers for Medicare and Medicaid Services includes, in addition to the face-to-face time of a patient visit (documented in  the note above) non-face-to-face time: obtaining and reviewing outside history, ordering and reviewing medications, tests or procedures, care coordination (communications with other health  care professionals or caregivers) and documentation in the medical record.

## 2024-02-27 NOTE — Assessment & Plan Note (Signed)
 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  She was most recently seen for ongoing breast redness, given a course of antibiotics but had persistent redness hence we proceeded with a punch biopsy.  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  Pathology was sent to [facility] for second opinion as well. This confirmed triple negative breast cancer. Repeat biopsy demonstrated her HER2 to be 1+.   Given locally advanced and unresectable breast cancer and the progression on TC ( after 2 cycles) and previous exposure to Adriamycin  based regimen, we decided to try Enhertu  for HER2 low disease and she had robust response.  She is now status post left mastectomy and axillary resection and there is no evidence of residual tumor, complete pathologic response obtained.  Since this is a recurrent breast cancer and high risk for systemic disease, I have discussed about adjuvant Xeloda  based on the create X trial, she is now on adjuvant xeloda .   She has completed 8 cycles of adjuvant Xeloda .  She continues on periodic imaging and signatera testing at this point.  Assessment and Plan Assessment & Plan Chest wall pain Intermittent severe chest wall pain likely due to post-surgical and radiation-induced scar tissue and sensitivity. Imaging ruled out lymphadenopathy, recurrence, or metastasis. Last signatera testing neg, she had another one drawn yesterday, awaiting results Although she is a high risk for recurrence, there seems to be no clear evidence for recurrence at this time. Reassured her, she will stay in touch and call us  back with any worsening of symptoms We will continue periodic imaging every 6 months in her case.  Shortness of breath Resolved shortness of breath, previously managed with albuterol  inhaler. - Prescribed albuterol  inhaler to  [facility] pharmacy at [facility] per pt request. CTA bilaterally today.

## 2024-02-29 ENCOUNTER — Other Ambulatory Visit: Payer: Self-pay

## 2024-03-04 ENCOUNTER — Encounter: Payer: Self-pay | Admitting: Hematology and Oncology

## 2024-03-04 LAB — SIGNATERA
SIGNATERA MTM READOUT: 0 MTM/ml
SIGNATERA TEST RESULT: NEGATIVE

## 2024-03-06 ENCOUNTER — Ambulatory Visit: Payer: Self-pay | Admitting: Hematology and Oncology

## 2024-05-07 ENCOUNTER — Telehealth: Payer: Self-pay | Admitting: Hematology and Oncology

## 2024-05-07 NOTE — Telephone Encounter (Signed)
 Interpretor ID [id number]. We spoke with the patient to inform her of her minor change for 9/23 with Dr. Loretha now scheduled for 10:45. Anokhi was acceptable to this change.

## 2024-05-08 ENCOUNTER — Other Ambulatory Visit: Payer: Self-pay

## 2024-05-24 LAB — FECAL OCCULT BLOOD, IMMUNOCHEMICAL: Fecal Occult Bld: NEGATIVE

## 2024-05-27 ENCOUNTER — Ambulatory Visit: Payer: Self-pay

## 2024-06-04 ENCOUNTER — Ambulatory Visit: Admitting: Hematology and Oncology

## 2024-06-04 ENCOUNTER — Inpatient Hospital Stay

## 2024-06-04 ENCOUNTER — Other Ambulatory Visit: Payer: Self-pay

## 2024-06-04 ENCOUNTER — Inpatient Hospital Stay: Attending: Hematology and Oncology | Admitting: Hematology and Oncology

## 2024-06-04 VITALS — BP 115/73 | HR 66 | Temp 97.9°F | Resp 20 | Wt 192.6 lb

## 2024-06-04 DIAGNOSIS — C50919 Malignant neoplasm of unspecified site of unspecified female breast: Secondary | ICD-10-CM

## 2024-06-04 DIAGNOSIS — Z1722 Progesterone receptor negative status: Secondary | ICD-10-CM | POA: Insufficient documentation

## 2024-06-04 DIAGNOSIS — C50412 Malignant neoplasm of upper-outer quadrant of left female breast: Secondary | ICD-10-CM

## 2024-06-04 DIAGNOSIS — C792 Secondary malignant neoplasm of skin: Secondary | ICD-10-CM | POA: Insufficient documentation

## 2024-06-04 DIAGNOSIS — Z923 Personal history of irradiation: Secondary | ICD-10-CM | POA: Insufficient documentation

## 2024-06-04 DIAGNOSIS — C50912 Malignant neoplasm of unspecified site of left female breast: Secondary | ICD-10-CM

## 2024-06-04 DIAGNOSIS — R519 Headache, unspecified: Secondary | ICD-10-CM | POA: Insufficient documentation

## 2024-06-04 DIAGNOSIS — N6325 Unspecified lump in the left breast, overlapping quadrants: Secondary | ICD-10-CM

## 2024-06-04 DIAGNOSIS — N644 Mastodynia: Secondary | ICD-10-CM | POA: Insufficient documentation

## 2024-06-04 DIAGNOSIS — Z9221 Personal history of antineoplastic chemotherapy: Secondary | ICD-10-CM | POA: Insufficient documentation

## 2024-06-04 DIAGNOSIS — Z1732 Human epidermal growth factor receptor 2 negative status: Secondary | ICD-10-CM | POA: Insufficient documentation

## 2024-06-04 DIAGNOSIS — Z9012 Acquired absence of left breast and nipple: Secondary | ICD-10-CM | POA: Insufficient documentation

## 2024-06-04 DIAGNOSIS — Z170 Estrogen receptor positive status [ER+]: Secondary | ICD-10-CM | POA: Insufficient documentation

## 2024-06-04 DIAGNOSIS — H538 Other visual disturbances: Secondary | ICD-10-CM | POA: Insufficient documentation

## 2024-06-04 LAB — CBC WITH DIFFERENTIAL/PLATELET
Abs Immature Granulocytes: 0.01 K/uL (ref 0.00–0.07)
Basophils Absolute: 0 K/uL (ref 0.0–0.1)
Basophils Relative: 0 %
Eosinophils Absolute: 0 K/uL (ref 0.0–0.5)
Eosinophils Relative: 1 %
HCT: 39.1 % (ref 36.0–46.0)
Hemoglobin: 13.1 g/dL (ref 12.0–15.0)
Immature Granulocytes: 0 %
Lymphocytes Relative: 37 %
Lymphs Abs: 1.6 K/uL (ref 0.7–4.0)
MCH: 27.7 pg (ref 26.0–34.0)
MCHC: 33.5 g/dL (ref 30.0–36.0)
MCV: 82.7 fL (ref 80.0–100.0)
Monocytes Absolute: 0.2 K/uL (ref 0.1–1.0)
Monocytes Relative: 6 %
Neutro Abs: 2.4 K/uL (ref 1.7–7.7)
Neutrophils Relative %: 56 %
Platelets: 208 K/uL (ref 150–400)
RBC: 4.73 MIL/uL (ref 3.87–5.11)
RDW: 13.5 % (ref 11.5–15.5)
WBC: 4.3 K/uL (ref 4.0–10.5)
nRBC: 0 % (ref 0.0–0.2)

## 2024-06-04 LAB — CMP (CANCER CENTER ONLY)
ALT: 17 U/L (ref 0–44)
AST: 18 U/L (ref 15–41)
Albumin: 4.3 g/dL (ref 3.5–5.0)
Alkaline Phosphatase: 88 U/L (ref 38–126)
Anion gap: 5 (ref 5–15)
BUN: 9 mg/dL (ref 6–20)
CO2: 29 mmol/L (ref 22–32)
Calcium: 9.5 mg/dL (ref 8.9–10.3)
Chloride: 103 mmol/L (ref 98–111)
Creatinine: 0.69 mg/dL (ref 0.44–1.00)
GFR, Estimated: >60 mL/min
Glucose, Bld: 89 mg/dL (ref 70–99)
Potassium: 3.9 mmol/L (ref 3.5–5.1)
Sodium: 137 mmol/L (ref 135–145)
Total Bilirubin: 0.4 mg/dL (ref 0.0–1.2)
Total Protein: 7.4 g/dL (ref 6.5–8.1)

## 2024-06-04 LAB — GENETIC SCREENING ORDER

## 2024-06-04 NOTE — Progress Notes (Signed)
 [facility] Cancer Follow up:    Monica Bascom RAMAN, NP [address] [place] [address]   DIAGNOSIS:  Cancer Staging  Malignant neoplasm of upper-outer quadrant of left breast in female, estrogen receptor positive (HCC) Staging form: Breast, AJCC 8th Edition - Clinical stage from 01/29/2020: Stage IIIA (cT2, cN1, cM0, G3, ER+, PR-, HER2-) - Signed by Loretha Ash, MD on 06/28/2022 Stage prefix: Initial diagnosis Histologic grading system: 3 grade system   SUMMARY OF ONCOLOGIC HISTORY: Oncology History  Malignant neoplasm of upper-outer quadrant of left breast in female, estrogen receptor positive (HCC)  01/23/2020 Initial Diagnosis   [place] woman status post left breast upper outer quadrant biopsy 01/23/2020 for a clinical T3 N1, 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             (a) chest CT scan and bone scan 02/07/2020 showed no evidence of metastatic disease   01/29/2020 Cancer Staging   Staging form: Breast, AJCC 8th Edition - Clinical stage from 01/29/2020: Stage IIIA (cT2, cN1, cM0, G3, ER+, PR-, HER2-) - Signed by Loretha Ash, MD on 06/28/2022 Stage prefix: Initial diagnosis Histologic grading system: 3 grade system   02/07/2020 Genetic Testing   Negative genetic testing. No pathogenic variants identified on the [facility] Common Hereditary Cancers Panel. VUS in MSH6 called c.2744C>G identified. The report date is 02/07/2020.  The Common Hereditary Cancers Panel offered by [facility] includes sequencing and/or deletion duplication testing of the following 48 genes: APC, ATM, AXIN2, BARD1, BMPR1A, BRCA1, BRCA2, BRIP1, CDH1, CDKN2A (p14ARF), CDKN2A (p16INK4a), CKD4, CHEK2, CTNNA1, DICER1, EPCAM (Deletion/duplication testing only), GREM1 (promoter region deletion/duplication testing only), KIT, MEN1, MLH1, MSH2, MSH3, MSH6, MUTYH, NBN, NF1, NHTL1, PALB2, PDGFRA, PMS2, POLD1, POLE, PTEN, RAD50,  RAD51C, RAD51D, RNF43, SDHB, SDHC, SDHD, SMAD4, SMARCA4. STK11, TP53, TSC1, TSC2, and VHL.  The following genes were evaluated for sequence changes only: SDHA and HOXB13 c.251G>A variant only.  UPDATE: MSH6 c.2744C>G (p.Ala915Gly) VUS has been amended to Benign. Amended report date is 02/13/2023.   02/12/2020 - 05/28/2020 Neo-Adjuvant Chemotherapy   neoadjuvant chemotherapy consisting of doxorubicin  and cyclophosphamide  in dose dense fashion x4 started 02/12/2020, completed 03/25/2020, followed by paclitaxel  and carboplatin  weekly x12 started 04/08/2020 and completed on 05/28/2020             (a) echo 02/07/2020 shows an ejection fraction in the 55-60% range             (b) Epirubicin  substituted for Doxorubicin  starting with cycle 2 of AC secondary to allergic rash from Doxorubicin    07/15/2020 Surgery   status post left lumpectomy and sentinel lymph node sampling 07/15/2020 for a ypT1b ypN0 residual invasive ductal carcinoma, grade 3, with negative margins.             (a) a total of 4 left axillary lymph nodes were removed             (b) repeat prognostic panel on the final pathology again triple negative, with an MIB-1 of 50%.   08/26/2020 - 10/09/2020 Radiation Therapy   adjuvant radiation completed 10/09/2020             (a) sensitizing capecitabine  attempted but not tolerated by the patient  08/26/2020 through 10/09/2020 Site Technique Total Dose (Gy) Dose per Fx (Gy) Completed Fx Beam Energies  Breast, Left: Breast_Lt 3D 50/50 2 25/25 15X  Breast, Left: Breast_Lt_PAB_SCV 3D 50/50 2 25/25 10X, 15X  Breast, Left:  Breast_Lt_Bst 3D 10/10 2 5/5 6X, 10X     11/23/2020 - 01/04/2021 Adjuvant Chemotherapy   adjuvant pembrolizumab  started 11/23/2020, to continue for 1 year             (a) discontinued after 01/04/2021 dose with multiple side effects requiring steroids for resolution   03/30/2022 Mammogram   Mammogram and ultrasound show numerous masses throughout left breast not visualized in  01/2022, +left axillary lymphadenopathy, skin thickening in right breast and in upper abdomen just below sternum   04/05/2022 - 07/20/2022 Chemotherapy   Patient is on Treatment Plan : BREAST METASTATIC Fam-Trastuzumab Deruxtecan-nxki  (Enhertu ) (5.4) q21d     08/21/2022 Surgery   Left breast mastectomy and lymph node biopsy shows no residual carcinoma.     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  Prognostic panel sent to [facility]: Left breast skin, punch biopsy: Skin with dermal lymphatic involvement by carcinoma (confirmed with outside CK7 IHC, reviewed). Biomarkers are reported as negative for ER, PR, Her2/neu (confirmed on review but please note sample size is extremely small).   02/15/2022 - 03/08/2022 Chemotherapy    Taxotere /Cytoxan  x 2, discontinued due to progression of breast.   03/30/2022 Mammogram   Mammogram and ultrasound show numerous masses throughout left breast not visualized in 01/2022, +left axillary lymphadenopathy, skin thickening in right breast and in upper abdomen just below sternum   04/05/2022 - 07/20/2022 Chemotherapy   Patient is on Treatment Plan : BREAST METASTATIC Fam-Trastuzumab Deruxtecan-nxki  (Enhertu ) (5.4) q21d     11/25/2022 -  Chemotherapy   Patient is on Treatment Plan : BREAST Capecitabine  q21d      She completed 8 cycles of adj xeloda .  CURRENT THERAPY: None  INTERVAL HISTORY: Discussed the use of AI scribe software for clinical note transcription with the patient, who gave verbal consent to proceed.  History of Present Illness [name] is a 48 year old female who presents for follow up.   [name] is a 48 year old female with a history of breast cancer who presents for follow-up regarding her symptoms and reconstruction options.  She  experiences ongoing fatigue and lack of energy. She had a period of chest pressure, which has since resolved. No issues with eating, breathing, bowel movements, or urination are reported.  She experiences occasional headaches and blurry vision. She uses over-the-counter reading glasses as recommended by an optometrist last year, who told her that prescription glasses were not necessary.  She continues to experience discomfort, pain, cramping, and inflammation in left chest wall.  She discusses her previous consultation with a surgeon regarding breast reconstruction. Due to radiation damage and scarring, she was informed that implants are not an option. She was referred to another surgeon for a different type of reconstruction, but has not received a follow-up call. She expresses uncertainty about proceeding with surgery due to the potential risks and her previous radiation treatments.  Rest of the pertinent 10 point ROS reviewed and negative  Patient Active Problem List   Diagnosis Date Noted   Prediabetes 08/22/2023   Abnormal craving 08/22/2023   Pure hypercholesterolemia 08/22/2023   Has health insurance with inadequate coverage of health expenses 08/22/2023   Elevated blood sugar 08/08/2023   Metabolic dysfunction-associated steatotic liver disease (MASLD) 08/08/2023   Class 2 severe obesity  with serious comorbidity and body mass index (BMI) of 35.0 to 35.9 in adult 08/08/2023   Vitamin B12 deficiency 02/03/2023   Vitamin D  deficiency 11/04/2022   Health care maintenance 09/30/2022   S/P left mastectomy 08/22/2022   Burning with urination 06/01/2022   Swelling of left upper extremity 05/18/2022   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) 04/01/2022   Encounter for dental examination 03/11/2022   Acute apical periodontitis 03/11/2022   Phobia of dental procedure 03/11/2022   Defective dental restoration 03/11/2022   Accretions on teeth 03/11/2022   Chronic periodontitis 03/11/2022    Teeth missing 03/11/2022   Caries 03/11/2022   Generalized gingival recession 03/11/2022   Pain, dental 03/08/2022   Cancer-related pain 03/08/2022   Port-A-Cath in place 02/15/2022   Oral allergy  syndrome, subsequent encounter 02/03/2022   Adverse effect of other drugs, medicaments and biological substances, subsequent encounter 02/02/2022   Other adverse food reactions, not elsewhere classified, subsequent encounter 02/02/2022   Chest tightness 02/02/2022   Other allergic rhinitis 02/02/2022   Allergic conjunctivitis of both eyes 02/02/2022   Language barrier 12/11/2020   Morbid obesity (HCC)    History of breast cancer    Genetic testing 02/13/2020   Malignant neoplasm of upper-outer quadrant of left breast in female, estrogen receptor positive (HCC) 01/27/2020   Impaired ability to use community resources due to language barrier 07/13/2013    is allergic to omnipaque  [iohexol ], doxorubicin  hcl, keytruda  [pembrolizumab ], other, tape, and wound dressing adhesive.  MEDICAL HISTORY: Past Medical History:  Diagnosis Date   Allergy     Asthma    Back pain    Breast cancer in female [facility])    Left   Breast disorder    breast cancer May 2021   Chronic back pain    Fatty liver    Headache    High triglycerides    History of breast cancer    History of COVID-19 04/20/2021   Hx of migraines    Morbid obesity (HCC)    Personal history of chemotherapy    Personal history of radiation therapy    SOB (shortness of breath)    Vitamin D  deficiency     SURGICAL HISTORY: Past Surgical History:  Procedure Laterality Date   BREAST LUMPECTOMY     BREAST LUMPECTOMY WITH RADIOACTIVE SEED AND SENTINEL LYMPH NODE BIOPSY Left 07/15/2020   Procedure: LEFT BREAST LUMPECTOMY WITH RADIOACTIVE SEED AND LEFT AXILLARY SENTINEL LYMPH NODE BIOPSY, LEFT AXILLARY NODE RADIOACTIVE SEED GUIDED EXCISION, LEFT BREAST RADIOACTIVE SEED GUIDED EXCISION IM NODE;  Surgeon: Ebbie Cough, MD;  Location: [facility]  OR;  Service: General;  Laterality: Left;  PEC BLOCK   CESAREAN SECTION WITH BILATERAL TUBAL LIGATION Bilateral 07/23/2015   Procedure: CESAREAN SECTION WITH BILATERAL TUBAL LIGATION;  Surgeon: Glenys GORMAN Birk, MD;  Location: [facility] ORS;  Service: Obstetrics;  Laterality: Bilateral;   IR IMAGING GUIDED PORT INSERTION  02/11/2022   MASTECTOMY Left    MODIFIED MASTECTOMY Left 08/22/2022   Procedure: LEFT MODIFIED RADICAL MASTECTOMY, LEFT AXILLARY NODE DISSECTION, AND SKIN BIOPSY OF BACK;  Surgeon: Ebbie Cough, MD;  Location: [facility] ORS;  Service: General;  Laterality: Left;   PORT-A-CATH REMOVAL N/A 04/30/2021   Procedure: REMOVAL PORT-A-CATH;  Surgeon: Ebbie Cough, MD;  Location: [facility];  Service: General;  Laterality: N/A;   PORTACATH PLACEMENT Right 02/11/2020   Procedure: INSERTION PORT-A-CATH WITH ULTRASOUND GUIDANCE;  Surgeon: Ebbie Cough, MD;  Location: [facility];  Service: General;  Laterality: Right;  TUBAL LIGATION      SOCIAL HISTORY: Social History   Socioeconomic History   Marital status: Legally Separated    Spouse name: Not on file   Number of children: 3   Years of education: Not on file   Highest education level: 9th grade  Occupational History   Occupation: Holiday representative  Tobacco Use   Smoking status: Never   Smokeless tobacco: Never  Vaping Use   Vaping status: Never Used  Substance and Sexual Activity   Alcohol use: No   Drug use: Never   Sexual activity: Yes    Birth control/protection: Surgical  Other Topics Concern   Not on file  Social History Narrative   Pt was born in [place]   Social Drivers of Health   Financial Resource Strain: Not on file  Food Insecurity: No Food Insecurity (11/21/2023)   Hunger Vital Sign    Worried About Running Out of Food in the Last Year: Never true    Ran Out of Food in the Last Year: Never true  Transportation Needs: No Transportation Needs (11/21/2023)   PRAPARE -  Administrator, Civil Service (Medical): No    Lack of Transportation (Non-Medical): No  Physical Activity: Not on file  Stress: Not on file  Social Connections: Not on file  Intimate Partner Violence: Not At Risk (10/11/2022)   Humiliation, Afraid, Rape, and Kick questionnaire    Fear of Current or Ex-Partner: No    Emotionally Abused: No    Physically Abused: No    Sexually Abused: No    FAMILY HISTORY: Family History  Problem Relation Age of Onset   Heart disease Maternal Grandmother      PHYSICAL EXAMINATION   She appears well, no distress No regional adenopathy Chest: Left breast s/p mastectomy, No concern for local recurrence. No redness of the skin concerning for inflammatory breast cancer recurrence. No redness on the posterior chest wall CTA bilaterally RRR No LE edema.  LABORATORY DATA:  CBC    Component Value Date/Time   WBC 4.3 06/04/2024 1225   RBC 4.73 06/04/2024 1225   HGB 13.1 06/04/2024 1225   HGB 13.5 02/27/2024 1009   HGB 13.5 08/08/2023 1036   HCT 39.1 06/04/2024 1225   HCT 41.4 08/08/2023 1036   PLT 208 06/04/2024 1225   PLT 186 02/27/2024 1009   PLT 193 08/08/2023 1036   MCV 82.7 06/04/2024 1225   MCV 91 08/08/2023 1036   MCH 27.7 06/04/2024 1225   MCHC 33.5 06/04/2024 1225   RDW 13.5 06/04/2024 1225   RDW 12.0 08/08/2023 1036   LYMPHSABS 1.6 06/04/2024 1225   LYMPHSABS 1.3 08/08/2023 1036   MONOABS 0.2 06/04/2024 1225   EOSABS 0.0 06/04/2024 1225   EOSABS 0.2 08/08/2023 1036   BASOSABS 0.0 06/04/2024 1225   BASOSABS 0.0 08/08/2023 1036    CMP     Component Value Date/Time   NA 137 06/04/2024 1225   NA 139 08/08/2023 1036   K 3.9 06/04/2024 1225   CL 103 06/04/2024 1225   CO2 29 06/04/2024 1225   GLUCOSE 89 06/04/2024 1225   BUN 9 06/04/2024 1225   BUN 12 08/08/2023 1036   CREATININE 0.69 06/04/2024 1225   CALCIUM  9.5 06/04/2024 1225   PROT 7.4 06/04/2024 1225   PROT 6.8 08/08/2023 1036   ALBUMIN 4.3  06/04/2024 1225   ALBUMIN 4.3 08/08/2023 1036   AST 18 06/04/2024 1225   ALT 17 06/04/2024 1225   ALKPHOS 88 06/04/2024  1225   BILITOT 0.4 06/04/2024 1225   GFRNONAA >60 06/04/2024 1225   GFRAA >60 06/03/2020 0949   GFRAA >60 02/26/2020 0915    ASSESSMENT and THERAPY PLAN:   Assessment and Plan  Malignant neoplasm of upper-outer quadrant of left breast in female, estrogen receptor positive (HCC) [name] is a 48 year old Spanish-speaking woman who has a 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  She was most recently seen for ongoing breast redness, given a course of antibiotics but had persistent redness hence we proceeded with a punch biopsy.  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  Pathology was sent to [facility] for second opinion as well. This confirmed triple negative breast cancer. Repeat biopsy demonstrated her HER2 to be 1+.   Given locally advanced and unresectable breast cancer and the progression on TC ( after 2 cycles) and previous exposure to Adriamycin  based regimen, we decided to try Enhertu  for HER2 low disease and she had robust response.  She then had left mastectomy and axillary resection and there is no evidence of residual tumor, complete pathologic response obtained.  Since this is a recurrent breast cancer and high risk for systemic disease, I have discussed about adjuvant Xeloda  based on the create X trial   She has completed 8 cycles of adjuvant Xeloda .  She continues on periodic imaging and signatera testing at this point.  Assessment and Plan Assessment & Plan Chronic breast pain and post-radiation changes Chronic breast pain and post-radiation changes due to previous breast cancer treatment.  Ambivalent about further surgery due to size, complexity, and healing concerns from prior radiation. Recommended to wait at least one more year cancer-free  before considering surgery. - Provide contact information for the referred surgeon in [facility] - Advise scheduling an appointment with the surgeon to discuss potential autologous tissue reconstruction. - Discuss potential risks and benefits of surgery, including the possibility of it not working and the complexity of the procedure.  Headache and intermittent blurry vision Intermittent headaches and blurry vision. Uses over-the-counter reading glasses as advised by a previous optometric evaluation. No recent changes in vision prescription. - Recommend a vision check-up at an optometrist to reassess vision needs.  Continue scans every 6 months, to be done in December Signatera every 3 months.        All questions were answered. The patient knows to call the clinic with any problems, questions or concerns. We can certainly see the patient much sooner if necessary.   Time spent: 30 min  *Total Encounter Time as defined by the Centers for Medicare and Medicaid Services includes, in addition to the face-to-face time of a patient visit (documented in the note above) non-face-to-face time: obtaining and reviewing outside history, ordering and reviewing medications, tests or procedures, care coordination (communications with other health care professionals or caregivers) and documentation in the medical record.

## 2024-06-04 NOTE — Assessment & Plan Note (Signed)
 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  She was most recently seen for ongoing breast redness, given a course of antibiotics but had persistent redness hence we proceeded with a punch biopsy.  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  Pathology was sent to [facility] for second opinion as well. This confirmed triple negative breast cancer. Repeat biopsy demonstrated her HER2 to be 1+.   Given locally advanced and unresectable breast cancer and the progression on TC ( after 2 cycles) and previous exposure to Adriamycin  based regimen, we decided to try Enhertu  for HER2 low disease and she had robust response.  She then had left mastectomy and axillary resection and there is no evidence of residual tumor, complete pathologic response obtained.  Since this is a recurrent breast cancer and high risk for systemic disease, I have discussed about adjuvant Xeloda  based on the create X trial   She has completed 8 cycles of adjuvant Xeloda .  She continues on periodic imaging and signatera testing at this point.  Assessment and Plan Assessment & Plan Chronic breast pain and post-radiation changes Chronic breast pain and post-radiation changes due to previous breast cancer treatment.  Ambivalent about further surgery due to size, complexity, and healing concerns from prior radiation. Recommended to wait at least one more year cancer-free before considering surgery. - Provide contact information for the referred surgeon in [facility] - Advise scheduling an appointment with the surgeon to discuss potential autologous tissue reconstruction. - Discuss potential risks and benefits of surgery, including the possibility of it not working and the complexity of the procedure.  Headache and intermittent blurry vision Intermittent headaches and blurry  vision. Uses over-the-counter reading glasses as advised by a previous optometric evaluation. No recent changes in vision prescription. - Recommend a vision check-up at an optometrist to reassess vision needs.  Continue scans every 6 months, to be done in December Signatera every 3 months.

## 2024-06-05 ENCOUNTER — Other Ambulatory Visit: Payer: Self-pay

## 2024-06-13 LAB — SIGNATERA
SIGNATERA MTM READOUT: 0 MTM/ml
SIGNATERA TEST RESULT: NEGATIVE

## 2024-06-14 ENCOUNTER — Ambulatory Visit: Payer: Self-pay | Admitting: Hematology and Oncology

## 2024-06-19 ENCOUNTER — Ambulatory Visit (HOSPITAL_COMMUNITY): Payer: Self-pay

## 2024-06-27 ENCOUNTER — Other Ambulatory Visit: Payer: Self-pay

## 2024-08-15 ENCOUNTER — Other Ambulatory Visit (HOSPITAL_COMMUNITY): Payer: Self-pay

## 2024-08-15 ENCOUNTER — Other Ambulatory Visit: Payer: Self-pay | Admitting: *Deleted

## 2024-08-15 ENCOUNTER — Encounter: Payer: Self-pay | Admitting: Hematology and Oncology

## 2024-08-15 MED ORDER — PREDNISONE 50 MG PO TABS
ORAL_TABLET | ORAL | 0 refills | Status: AC
  Filled 2024-08-15: qty 3, 1d supply, fill #0

## 2024-08-16 ENCOUNTER — Ambulatory Visit (HOSPITAL_COMMUNITY): Admission: RE | Admit: 2024-08-16 | Discharge: 2024-08-16 | Payer: Self-pay | Attending: Hematology and Oncology

## 2024-08-16 DIAGNOSIS — N6325 Unspecified lump in the left breast, overlapping quadrants: Secondary | ICD-10-CM

## 2024-08-16 DIAGNOSIS — C50412 Malignant neoplasm of upper-outer quadrant of left female breast: Secondary | ICD-10-CM

## 2024-08-16 MED ORDER — IOHEXOL 300 MG/ML  SOLN
100.0000 mL | Freq: Once | INTRAMUSCULAR | Status: AC | PRN
  Administered 2024-08-16: 100 mL via INTRAVENOUS

## 2024-08-16 MED ORDER — SODIUM CHLORIDE (PF) 0.9 % IJ SOLN
INTRAMUSCULAR | Status: AC
  Filled 2024-08-16: qty 50

## 2024-08-21 NOTE — Telephone Encounter (Signed)
-----   Message from Matthews Iruku sent at 08/21/2024  8:13 AM EST ----- Scans without any evidence of cancer. Thanks, ----- Message ----- From: Interface, Rad Results In Sent: 08/20/2024  10:00 AM EST To: [name], MD

## 2024-08-21 NOTE — Telephone Encounter (Signed)
 [name] [facility] interpreter contacted to let pt know her scans are negative per MD.

## 2024-08-26 ENCOUNTER — Other Ambulatory Visit: Payer: Self-pay

## 2024-08-26 DIAGNOSIS — C50412 Malignant neoplasm of upper-outer quadrant of left female breast: Secondary | ICD-10-CM

## 2024-08-27 ENCOUNTER — Inpatient Hospital Stay: Attending: Hematology and Oncology

## 2024-08-27 ENCOUNTER — Encounter: Payer: Self-pay | Admitting: Hematology and Oncology

## 2024-08-27 ENCOUNTER — Inpatient Hospital Stay

## 2024-08-27 ENCOUNTER — Other Ambulatory Visit: Payer: Self-pay | Admitting: *Deleted

## 2024-08-27 ENCOUNTER — Inpatient Hospital Stay: Admitting: Hematology and Oncology

## 2024-08-27 VITALS — BP 115/73 | HR 87 | Temp 97.6°F | Resp 16 | Wt 192.1 lb

## 2024-08-27 DIAGNOSIS — Z8616 Personal history of COVID-19: Secondary | ICD-10-CM | POA: Insufficient documentation

## 2024-08-27 DIAGNOSIS — E782 Mixed hyperlipidemia: Secondary | ICD-10-CM | POA: Insufficient documentation

## 2024-08-27 DIAGNOSIS — E7800 Pure hypercholesterolemia, unspecified: Secondary | ICD-10-CM | POA: Insufficient documentation

## 2024-08-27 DIAGNOSIS — R5383 Other fatigue: Secondary | ICD-10-CM | POA: Insufficient documentation

## 2024-08-27 DIAGNOSIS — Z1721 Progesterone receptor positive status: Secondary | ICD-10-CM | POA: Insufficient documentation

## 2024-08-27 DIAGNOSIS — E66812 Obesity, class 2: Secondary | ICD-10-CM | POA: Insufficient documentation

## 2024-08-27 DIAGNOSIS — Z170 Estrogen receptor positive status [ER+]: Secondary | ICD-10-CM | POA: Insufficient documentation

## 2024-08-27 DIAGNOSIS — Z9012 Acquired absence of left breast and nipple: Secondary | ICD-10-CM | POA: Insufficient documentation

## 2024-08-27 DIAGNOSIS — R519 Headache, unspecified: Secondary | ICD-10-CM | POA: Insufficient documentation

## 2024-08-27 DIAGNOSIS — C50412 Malignant neoplasm of upper-outer quadrant of left female breast: Secondary | ICD-10-CM

## 2024-08-27 DIAGNOSIS — M549 Dorsalgia, unspecified: Secondary | ICD-10-CM | POA: Insufficient documentation

## 2024-08-27 DIAGNOSIS — R42 Dizziness and giddiness: Secondary | ICD-10-CM | POA: Insufficient documentation

## 2024-08-27 DIAGNOSIS — Z6835 Body mass index (BMI) 35.0-35.9, adult: Secondary | ICD-10-CM | POA: Insufficient documentation

## 2024-08-27 DIAGNOSIS — C50912 Malignant neoplasm of unspecified site of left female breast: Secondary | ICD-10-CM

## 2024-08-27 DIAGNOSIS — R1011 Right upper quadrant pain: Secondary | ICD-10-CM | POA: Insufficient documentation

## 2024-08-27 DIAGNOSIS — Z9221 Personal history of antineoplastic chemotherapy: Secondary | ICD-10-CM | POA: Insufficient documentation

## 2024-08-27 DIAGNOSIS — E538 Deficiency of other specified B group vitamins: Secondary | ICD-10-CM | POA: Insufficient documentation

## 2024-08-27 DIAGNOSIS — G893 Neoplasm related pain (acute) (chronic): Secondary | ICD-10-CM | POA: Insufficient documentation

## 2024-08-27 DIAGNOSIS — K760 Fatty (change of) liver, not elsewhere classified: Secondary | ICD-10-CM | POA: Insufficient documentation

## 2024-08-27 DIAGNOSIS — C50A0 Malignant inflammatory neoplasm of unspecified breast: Secondary | ICD-10-CM

## 2024-08-27 DIAGNOSIS — Z923 Personal history of irradiation: Secondary | ICD-10-CM | POA: Insufficient documentation

## 2024-08-27 DIAGNOSIS — Z1732 Human epidermal growth factor receptor 2 negative status: Secondary | ICD-10-CM | POA: Insufficient documentation

## 2024-08-27 DIAGNOSIS — C792 Secondary malignant neoplasm of skin: Secondary | ICD-10-CM | POA: Insufficient documentation

## 2024-08-27 LAB — CBC WITH DIFFERENTIAL (CANCER CENTER ONLY)
Abs Immature Granulocytes: 0.05 K/uL (ref 0.00–0.07)
Basophils Absolute: 0 K/uL (ref 0.0–0.1)
Basophils Relative: 1 %
Eosinophils Absolute: 0.1 K/uL (ref 0.0–0.5)
Eosinophils Relative: 1 %
HCT: 40.2 % (ref 36.0–46.0)
Hemoglobin: 13.3 g/dL (ref 12.0–15.0)
Immature Granulocytes: 1 %
Lymphocytes Relative: 31 %
Lymphs Abs: 1.6 K/uL (ref 0.7–4.0)
MCH: 27.6 pg (ref 26.0–34.0)
MCHC: 33.1 g/dL (ref 30.0–36.0)
MCV: 83.4 fL (ref 80.0–100.0)
Monocytes Absolute: 0.3 K/uL (ref 0.1–1.0)
Monocytes Relative: 5 %
Neutro Abs: 3.2 K/uL (ref 1.7–7.7)
Neutrophils Relative %: 61 %
Platelet Count: 207 K/uL (ref 150–400)
RBC: 4.82 MIL/uL (ref 3.87–5.11)
RDW: 13.4 % (ref 11.5–15.5)
WBC Count: 5.2 K/uL (ref 4.0–10.5)
nRBC: 0 % (ref 0.0–0.2)

## 2024-08-27 LAB — CMP (CANCER CENTER ONLY)
ALT: 16 U/L (ref 0–44)
AST: 20 U/L (ref 15–41)
Albumin: 4.3 g/dL (ref 3.5–5.0)
Alkaline Phosphatase: 108 U/L (ref 38–126)
Anion gap: 10 (ref 5–15)
BUN: 12 mg/dL (ref 6–20)
CO2: 27 mmol/L (ref 22–32)
Calcium: 9.7 mg/dL (ref 8.9–10.3)
Chloride: 103 mmol/L (ref 98–111)
Creatinine: 0.83 mg/dL (ref 0.44–1.00)
GFR, Estimated: >60 mL/min (ref >60)
Glucose, Bld: 92 mg/dL (ref 70–99)
Potassium: 3.8 mmol/L (ref 3.5–5.1)
Sodium: 140 mmol/L (ref 135–145)
Total Bilirubin: 0.3 mg/dL (ref 0.0–1.2)
Total Protein: 7.3 g/dL (ref 6.5–8.1)

## 2024-08-27 LAB — GENETIC SCREENING ORDER

## 2024-08-27 NOTE — Progress Notes (Signed)
 [facility] Cancer Follow up:    Monica Bascom RAMAN, [name] [address] [place] [address]   DIAGNOSIS:  Cancer Staging  Malignant neoplasm of upper-outer quadrant of left breast in female, estrogen receptor positive (HCC) Staging form: Breast, AJCC 8th Edition - Clinical stage from 01/29/2020: Stage IIIA (cT2, cN1, cM0, G3, ER+, PR-, HER2-) - Signed by Loretha Ash, MD on 06/28/2022 Stage prefix: Initial diagnosis Histologic grading system: 3 grade system   SUMMARY OF ONCOLOGIC HISTORY: Oncology History  Malignant neoplasm of upper-outer quadrant of left breast in female, estrogen receptor positive (HCC)  01/23/2020 Initial Diagnosis   Kuna woman status post left breast upper outer quadrant biopsy 01/23/2020 for a clinical T3 N1, 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             (a) chest CT scan and bone scan 02/07/2020 showed no evidence of metastatic disease   01/29/2020 Cancer Staging   Staging form: Breast, AJCC 8th Edition - Clinical stage from 01/29/2020: Stage IIIA (cT2, cN1, cM0, G3, ER+, PR-, HER2-) - Signed by Loretha Ash, MD on 06/28/2022 Stage prefix: Initial diagnosis Histologic grading system: 3 grade system   02/07/2020 Genetic Testing   Negative genetic testing. No pathogenic variants identified on the [facility] Common Hereditary Cancers Panel. VUS in MSH6 called c.2744C>G identified. The report date is 02/07/2020.  The Common Hereditary Cancers Panel offered by [facility] includes sequencing and/or deletion duplication testing of the following 48 genes: APC, ATM, AXIN2, BARD1, BMPR1A, BRCA1, BRCA2, BRIP1, CDH1, CDKN2A (p14ARF), CDKN2A (p16INK4a), CKD4, CHEK2, CTNNA1, DICER1, EPCAM (Deletion/duplication testing only), GREM1 (promoter region deletion/duplication testing only), KIT, MEN1, MLH1, MSH2, MSH3, MSH6, MUTYH, NBN, NF1, NHTL1, PALB2, PDGFRA, PMS2, POLD1, POLE, PTEN, RAD50,  RAD51C, RAD51D, RNF43, SDHB, SDHC, SDHD, SMAD4, SMARCA4. STK11, TP53, TSC1, TSC2, and VHL.  The following genes were evaluated for sequence changes only: SDHA and HOXB13 c.251G>A variant only.  UPDATE: MSH6 c.2744C>G (p.Ala915Gly) VUS has been amended to Benign. Amended report date is 02/13/2023.   02/12/2020 - 05/28/2020 Neo-Adjuvant Chemotherapy   neoadjuvant chemotherapy consisting of doxorubicin  and cyclophosphamide  in dose dense fashion x4 started 02/12/2020, completed 03/25/2020, followed by paclitaxel  and carboplatin  weekly x12 started 04/08/2020 and completed on 05/28/2020             (a) echo 02/07/2020 shows an ejection fraction in the 55-60% range             (b) Epirubicin  substituted for Doxorubicin  starting with cycle 2 of AC secondary to allergic rash from Doxorubicin    07/15/2020 Surgery   status post left lumpectomy and sentinel lymph node sampling 07/15/2020 for a ypT1b ypN0 residual invasive ductal carcinoma, grade 3, with negative margins.             (a) a total of 4 left axillary lymph nodes were removed             (b) repeat prognostic panel on the final pathology again triple negative, with an MIB-1 of 50%.   08/26/2020 - 10/09/2020 Radiation Therapy   adjuvant radiation completed 10/09/2020             (a) sensitizing capecitabine  attempted but not tolerated by the patient  08/26/2020 through 10/09/2020 Site Technique Total Dose (Gy) Dose per Fx (Gy) Completed Fx Beam Energies  Breast, Left: Breast_Lt 3D 50/50 2 25/25 15X  Breast, Left: Breast_Lt_PAB_SCV 3D 50/50 2 25/25 10X, 15X  Breast, Left:  Breast_Lt_Bst 3D 10/10 2 5/5 6X, 10X     11/23/2020 - 01/04/2021 Adjuvant Chemotherapy   adjuvant pembrolizumab  started 11/23/2020, to continue for 1 year             (a) discontinued after 01/04/2021 dose with multiple side effects requiring steroids for resolution   03/30/2022 Mammogram   Mammogram and ultrasound show numerous masses throughout left breast not visualized in  01/2022, +left axillary lymphadenopathy, skin thickening in right breast and in upper abdomen just below sternum   04/05/2022 - 07/20/2022 Chemotherapy   Patient is on Treatment Plan : BREAST METASTATIC Fam-Trastuzumab Deruxtecan-nxki  (Enhertu ) (5.4) q21d     08/21/2022 Surgery   Left breast mastectomy and lymph node biopsy shows no residual carcinoma.     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  Prognostic panel sent to [facility]: Left breast skin, punch biopsy: Skin with dermal lymphatic involvement by carcinoma (confirmed with outside CK7 IHC, reviewed). Biomarkers are reported as negative for ER, PR, Her2/neu (confirmed on review but please note sample size is extremely small).   02/15/2022 - 03/08/2022 Chemotherapy    Taxotere /Cytoxan  x 2, discontinued due to progression of breast.   03/30/2022 Mammogram   Mammogram and ultrasound show numerous masses throughout left breast not visualized in 01/2022, +left axillary lymphadenopathy, skin thickening in right breast and in upper abdomen just below sternum   04/05/2022 - 07/20/2022 Chemotherapy   Patient is on Treatment Plan : BREAST METASTATIC Fam-Trastuzumab Deruxtecan-nxki  (Enhertu ) (5.4) q21d     11/25/2022 -  Chemotherapy   Patient is on Treatment Plan : BREAST Capecitabine  q21d      She completed 8 cycles of adj xeloda .  CURRENT THERAPY: None  INTERVAL HISTORY: Discussed the use of AI scribe software for clinical note transcription with the patient, who gave verbal consent to proceed.  History of Present Illness  [name] is a 48 year old female with breast cancer in remission who presents for oncology follow-up due to persistent fatigue and right upper quadrant pain.  She describes persistent fatigue, stating she feels tired at all times.  She experiences chronic, stable headaches and occasional dizziness. Since her last chemotherapy, she has noted increased periorbital edema that has not resolved. She also reports musculoskeletal pain, including back pain, pain at the site of prior surgery, and generalized bone pain. She delays sleep until very late due to pain.  She reports a distinct, persistent right upper quadrant pain, described as pulsating and sometimes severe. The pain is constant, worsened by lying on her side, and is distinct from her generalized bone pain. She is unsure if the pain is related to food intake.  Rest of the pertinent 10 point ROS reviewed and negative  Patient Active Problem List   Diagnosis Date Noted   Prediabetes 08/22/2023   Abnormal craving 08/22/2023   Pure hypercholesterolemia 08/22/2023   Has health insurance with inadequate coverage of health expenses 08/22/2023   Elevated blood sugar 08/08/2023   Metabolic dysfunction-associated steatotic liver disease (MASLD) 08/08/2023   Class 2 severe obesity with serious comorbidity and body mass index (BMI) of 35.0 to 35.9 in adult 08/08/2023   Vitamin B12 deficiency 02/03/2023   Vitamin D  deficiency 11/04/2022   Health care maintenance 09/30/2022   S/P left mastectomy 08/22/2022   Burning with urination 06/01/2022  Swelling of left upper extremity 05/18/2022   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) 04/01/2022   Encounter for dental examination 03/11/2022   Acute apical periodontitis 03/11/2022   Phobia of dental procedure 03/11/2022   Defective dental restoration 03/11/2022   Accretions on teeth 03/11/2022   Chronic periodontitis 03/11/2022   Teeth missing 03/11/2022   Caries 03/11/2022   Generalized gingival recession 03/11/2022   Pain, dental 03/08/2022   Cancer-related pain 03/08/2022   Port-A-Cath in place 02/15/2022   Oral allergy  syndrome, subsequent encounter 02/03/2022   Adverse effect of other drugs, medicaments and biological  substances, subsequent encounter 02/02/2022   Other adverse food reactions, not elsewhere classified, subsequent encounter 02/02/2022   Chest tightness 02/02/2022   Other allergic rhinitis 02/02/2022   Allergic conjunctivitis of both eyes 02/02/2022   Language barrier 12/11/2020   Morbid obesity (HCC)    History of breast cancer    Genetic testing 02/13/2020   Malignant neoplasm of upper-outer quadrant of left breast in female, estrogen receptor positive (HCC) 01/27/2020   Impaired ability to use community resources due to language barrier 07/13/2013    is allergic to omnipaque  [iohexol ], doxorubicin  hcl, keytruda  [pembrolizumab ], other, tape, and wound dressing adhesive.  MEDICAL HISTORY: Past Medical History:  Diagnosis Date   Allergy     Asthma    Back pain    Breast cancer in female [facility])    Left   Breast disorder    breast cancer May 2021   Chronic back pain    Fatty liver    Headache    High triglycerides    History of breast cancer    History of COVID-19 04/20/2021   Hx of migraines    Morbid obesity (HCC)    Personal history of chemotherapy    Personal history of radiation therapy    SOB (shortness of breath)    Vitamin D  deficiency     SURGICAL HISTORY: Past Surgical History:  Procedure Laterality Date   BREAST LUMPECTOMY     BREAST LUMPECTOMY WITH RADIOACTIVE SEED AND SENTINEL LYMPH NODE BIOPSY Left 07/15/2020   Procedure: LEFT BREAST LUMPECTOMY WITH RADIOACTIVE SEED AND LEFT AXILLARY SENTINEL LYMPH NODE BIOPSY, LEFT AXILLARY NODE RADIOACTIVE SEED GUIDED EXCISION, LEFT BREAST RADIOACTIVE SEED GUIDED EXCISION IM NODE;  Surgeon: Ebbie Cough, MD;  Location: [facility] OR;  Service: General;  Laterality: Left;  PEC BLOCK   CESAREAN SECTION WITH BILATERAL TUBAL LIGATION Bilateral 07/23/2015   Procedure: CESAREAN SECTION WITH BILATERAL TUBAL LIGATION;  Surgeon: Glenys GORMAN Birk, MD;  Location: [facility]S;  Service: Obstetrics;  Laterality: Bilateral;   IR IMAGING GUIDED PORT  INSERTION  02/11/2022   MASTECTOMY Left    MODIFIED MASTECTOMY Left 08/22/2022   Procedure: LEFT MODIFIED RADICAL MASTECTOMY, LEFT AXILLARY NODE DISSECTION, AND SKIN BIOPSY OF BACK;  Surgeon: Ebbie Cough, MD;  Location: [facility] OR[facility];  Service: General;  Laterality: Left;   PORT-A-CATH REMOVAL N/A 04/30/2021   Procedure: REMOVAL PORT-A-CATH;  Surgeon: Ebbie Cough, MD;  Location: [facility];  Service: General;  Laterality: N/A;   PORTACATH PLACEMENT Right 02/11/2020   Procedure: INSERTION PORT-A-CATH WITH ULTRASOUND GUIDANCE;  Surgeon: Ebbie Cough, MD;  Location:  SURGERY CENTER;  Service: General;  Laterality: Right;   TUBAL LIGATION      SOCIAL HISTORY: Social History   Socioeconomic History   Marital status: Legally Separated    Spouse name: Not on file   Number of children: 3   Years of education: Not on file   Highest  education level: 9th grade  Occupational History   Occupation: holiday representative  Tobacco Use   Smoking status: Never   Smokeless tobacco: Never  Vaping Use   Vaping status: Never Used  Substance and Sexual Activity   Alcohol use: No   Drug use: Never   Sexual activity: Yes    Birth control/protection: Surgical  Other Topics Concern   Not on file  Social History Narrative   Pt was born in [place]   Social Drivers of Health   Tobacco Use: Low Risk (01/03/2024)   Patient History    Smoking Tobacco Use: Never    Smokeless Tobacco Use: Never    Passive Exposure: Not on file  Financial Resource Strain: Not on file  Food Insecurity: No Food Insecurity (11/21/2023)   Hunger Vital Sign    Worried About Running Out of Food in the Last Year: Never true    Ran Out of Food in the Last Year: Never true  Transportation Needs: No Transportation Needs (11/21/2023)   PRAPARE - Administrator, Civil Service (Medical): No    Lack of Transportation (Non-Medical): No  Physical Activity: Not on file  Stress: Not on file   Social Connections: Not on file  Intimate Partner Violence: Not At Risk (10/11/2022)   Humiliation, Afraid, Rape, and Kick questionnaire    Fear of Current or Ex-Partner: No    Emotionally Abused: No    Physically Abused: No    Sexually Abused: No  Depression (PHQ2-9): Low Risk (06/04/2024)   Depression (PHQ2-9)    PHQ-2 Score: 1  Alcohol Screen: Not on file  Housing: Unknown (11/24/2023)   Received from [facility]   Epic    Unable to Pay for Housing in the Last Year: Not on file    Number of Times Moved in the Last Year: Not on file    At any time in the past 12 months, were you homeless or living in a shelter (including now)?: No  Utilities: Not At Risk (10/11/2022)   [employer] Utilities    Threatened with loss of utilities: No  Health Literacy: Not on file    FAMILY HISTORY: Family History  Problem Relation Age of Onset   Heart disease Maternal Grandmother      PHYSICAL EXAMINATION   She appears well, no distress No regional adenopathy Chest: Left breast s/p mastectomy, No concern for local recurrence. No redness of the skin concerning for inflammatory breast cancer recurrence. No redness on the posterior chest wall. Right breast normal to inspection and palpation. CTA bilaterally RRR No hepatomegaly, guarding, rigidity or rebound tenderness No LE edema.  LABORATORY DATA:  CBC    Component Value Date/Time   WBC 5.2 08/27/2024 0946   WBC 4.3 06/04/2024 1225   RBC 4.82 08/27/2024 0946   HGB 13.3 08/27/2024 0946   HGB 13.5 08/08/2023 1036   HCT 40.2 08/27/2024 0946   HCT 41.4 08/08/2023 1036   PLT 207 08/27/2024 0946   PLT 193 08/08/2023 1036   MCV 83.4 08/27/2024 0946   MCV 91 08/08/2023 1036   MCH 27.6 08/27/2024 0946   MCHC 33.1 08/27/2024 0946   RDW 13.4 08/27/2024 0946   RDW 12.0 08/08/2023 1036   LYMPHSABS 1.6 08/27/2024 0946   LYMPHSABS 1.3 08/08/2023 1036   MONOABS 0.3 08/27/2024 0946   EOSABS 0.1 08/27/2024 0946   EOSABS 0.2  08/08/2023 1036   BASOSABS 0.0 08/27/2024 0946   BASOSABS 0.0 08/08/2023 1036    CMP  Component Value Date/Time   NA 140 08/27/2024 0946   NA 139 08/08/2023 1036   K 3.8 08/27/2024 0946   CL 103 08/27/2024 0946   CO2 27 08/27/2024 0946   GLUCOSE 92 08/27/2024 0946   BUN 12 08/27/2024 0946   BUN 12 08/08/2023 1036   CREATININE 0.83 08/27/2024 0946   CALCIUM  9.7 08/27/2024 0946   PROT 7.3 08/27/2024 0946   PROT 6.8 08/08/2023 1036   ALBUMIN 4.3 08/27/2024 0946   ALBUMIN 4.3 08/08/2023 1036   AST 20 08/27/2024 0946   ALT 16 08/27/2024 0946   ALKPHOS 108 08/27/2024 0946   BILITOT 0.3 08/27/2024 0946   GFRNONAA >60 08/27/2024 0946   GFRAA >60 06/03/2020 0949   GFRAA >60 02/26/2020 0915    ASSESSMENT and THERAPY PLAN:  Assessment & Plan Breast cancer in remission Remains in remission with no evidence of active malignancy. Recent tests and imaging reassuring. Continued surveillance necessary due to oncologic history. - Reviewed recent laboratory studies and imaging showing no recurrence. - Scheduled Signatera blood test for January as part of surveillance; testing performed in clinic. - Reinforced need for continued surveillance with regular laboratory studies and imaging.  Right upper quadrant pain Persistent, pulsatile right upper quadrant pain likely musculoskeletal or gallbladder-related. No hepatic or oncologic involvement identified. - Reviewed recent CT scan showing normal liver and no malignancy. - Discussed possible gallbladder etiology; advised monitoring correlation with food intake, especially fatty foods. - Advised further gallbladder evaluation if pain persists or worsens, especially postprandially.  Adverse effects of chemotherapy Chronic fatigue, headaches, dizziness, periorbital swelling, and musculoskeletal pain are residual effects of prior chemotherapy, not indicative of recurrent malignancy. - Provided reassurance that these are recognized adverse  effects under surveillance.  All questions were answered. The patient knows to call the clinic with any problems, questions or concerns. We can certainly see the patient much sooner if necessary.   Time spent: 30 min  *Total Encounter Time as defined by the Centers for Medicare and Medicaid Services includes, in addition to the face-to-face time of a patient visit (documented in the note above) non-face-to-face time: obtaining and reviewing outside history, ordering and reviewing medications, tests or procedures, care coordination (communications with other health care professionals or caregivers) and documentation in the medical record.

## 2024-08-28 ENCOUNTER — Other Ambulatory Visit: Payer: Self-pay

## 2024-08-30 ENCOUNTER — Other Ambulatory Visit: Payer: Self-pay

## 2024-08-30 NOTE — Progress Notes (Signed)
 As per Dr. Loretha patient needs to have Signatera redrawn due to the the kit being out of date. Took new kit to the lab and contacted patient made app for Monday at 9am. Made patient aware she had no further questions at this time

## 2024-09-02 ENCOUNTER — Inpatient Hospital Stay

## 2024-09-02 DIAGNOSIS — C50A0 Malignant inflammatory neoplasm of unspecified breast: Secondary | ICD-10-CM

## 2024-09-02 DIAGNOSIS — Z170 Estrogen receptor positive status [ER+]: Secondary | ICD-10-CM

## 2024-09-02 DIAGNOSIS — C50912 Malignant neoplasm of unspecified site of left female breast: Secondary | ICD-10-CM

## 2024-09-02 LAB — GENETIC SCREENING ORDER

## 2024-09-10 LAB — SIGNATERA
SIGNATERA MTM READOUT: 0 MTM/ml
SIGNATERA TEST RESULT: NEGATIVE

## 2024-09-11 ENCOUNTER — Other Ambulatory Visit: Payer: Self-pay

## 2024-11-26 ENCOUNTER — Inpatient Hospital Stay: Payer: Self-pay

## 2024-11-26 ENCOUNTER — Inpatient Hospital Stay: Payer: Self-pay | Admitting: Hematology and Oncology
